# Patient Record
Sex: Male | Born: 1940 | Race: White | Hispanic: No | Marital: Married | Smoking: Former smoker
Health system: Southern US, Community
[De-identification: ages and names within clinical notes are randomized; demographics above are authoritative.]

## PROBLEM LIST (undated history)

## (undated) DIAGNOSIS — M1990 Unspecified osteoarthritis, unspecified site: Secondary | ICD-10-CM

## (undated) DIAGNOSIS — I2510 Atherosclerotic heart disease of native coronary artery without angina pectoris: Secondary | ICD-10-CM

## (undated) DIAGNOSIS — D126 Benign neoplasm of colon, unspecified: Secondary | ICD-10-CM

## (undated) DIAGNOSIS — Z8719 Personal history of other diseases of the digestive system: Secondary | ICD-10-CM

## (undated) DIAGNOSIS — N529 Male erectile dysfunction, unspecified: Secondary | ICD-10-CM

## (undated) DIAGNOSIS — Z87442 Personal history of urinary calculi: Secondary | ICD-10-CM

## (undated) DIAGNOSIS — E119 Type 2 diabetes mellitus without complications: Secondary | ICD-10-CM

## (undated) DIAGNOSIS — Z720 Tobacco use: Secondary | ICD-10-CM

## (undated) DIAGNOSIS — J302 Other seasonal allergic rhinitis: Secondary | ICD-10-CM

## (undated) DIAGNOSIS — N400 Enlarged prostate without lower urinary tract symptoms: Secondary | ICD-10-CM

## (undated) DIAGNOSIS — I10 Essential (primary) hypertension: Secondary | ICD-10-CM

## (undated) DIAGNOSIS — E785 Hyperlipidemia, unspecified: Secondary | ICD-10-CM

## (undated) DIAGNOSIS — K219 Gastro-esophageal reflux disease without esophagitis: Secondary | ICD-10-CM

## (undated) DIAGNOSIS — R011 Cardiac murmur, unspecified: Secondary | ICD-10-CM

## (undated) HISTORY — PX: NASAL SINUS SURGERY: SHX719

## (undated) HISTORY — DX: Benign neoplasm of colon, unspecified: D12.6

## (undated) HISTORY — PX: CATARACT EXTRACTION W/ INTRAOCULAR LENS  IMPLANT, BILATERAL: SHX1307

## (undated) HISTORY — PX: TONSILLECTOMY: SUR1361

## (undated) HISTORY — DX: Hyperlipidemia, unspecified: E78.5

## (undated) HISTORY — PX: APPENDECTOMY: SHX54

## (undated) HISTORY — DX: Tobacco use: Z72.0

## (undated) HISTORY — DX: Male erectile dysfunction, unspecified: N52.9

## (undated) HISTORY — DX: Benign prostatic hyperplasia without lower urinary tract symptoms: N40.0

## (undated) HISTORY — DX: Atherosclerotic heart disease of native coronary artery without angina pectoris: I25.10

## (undated) HISTORY — PX: NASAL SEPTUM SURGERY: SHX37

## (undated) HISTORY — PX: COLONOSCOPY W/ BIOPSIES AND POLYPECTOMY: SHX1376

## (undated) HISTORY — DX: Essential (primary) hypertension: I10

---

## 1999-07-17 ENCOUNTER — Ambulatory Visit (HOSPITAL_COMMUNITY): Admission: RE | Admit: 1999-07-17 | Discharge: 1999-07-17 | Payer: Self-pay | Admitting: *Deleted

## 2003-04-09 ENCOUNTER — Ambulatory Visit (HOSPITAL_COMMUNITY): Admission: RE | Admit: 2003-04-09 | Discharge: 2003-04-09 | Payer: Self-pay | Admitting: *Deleted

## 2003-05-06 ENCOUNTER — Ambulatory Visit (HOSPITAL_COMMUNITY): Admission: RE | Admit: 2003-05-06 | Discharge: 2003-05-06 | Payer: Self-pay | Admitting: *Deleted

## 2003-05-09 ENCOUNTER — Inpatient Hospital Stay (HOSPITAL_BASED_OUTPATIENT_CLINIC_OR_DEPARTMENT_OTHER): Admission: RE | Admit: 2003-05-09 | Discharge: 2003-05-09 | Payer: Self-pay | Admitting: *Deleted

## 2003-05-31 ENCOUNTER — Ambulatory Visit (HOSPITAL_COMMUNITY): Admission: RE | Admit: 2003-05-31 | Discharge: 2003-05-31 | Payer: Self-pay | Admitting: Internal Medicine

## 2003-10-28 ENCOUNTER — Emergency Department (HOSPITAL_COMMUNITY): Admission: EM | Admit: 2003-10-28 | Discharge: 2003-10-28 | Payer: Self-pay | Admitting: Emergency Medicine

## 2004-05-22 ENCOUNTER — Ambulatory Visit: Payer: Self-pay | Admitting: *Deleted

## 2004-06-01 ENCOUNTER — Ambulatory Visit: Payer: Self-pay | Admitting: *Deleted

## 2004-10-12 ENCOUNTER — Ambulatory Visit: Payer: Self-pay | Admitting: *Deleted

## 2005-05-30 ENCOUNTER — Emergency Department (HOSPITAL_COMMUNITY): Admission: EM | Admit: 2005-05-30 | Discharge: 2005-05-30 | Payer: Self-pay | Admitting: Emergency Medicine

## 2005-09-27 ENCOUNTER — Ambulatory Visit: Payer: Self-pay | Admitting: *Deleted

## 2005-10-15 ENCOUNTER — Ambulatory Visit: Payer: Self-pay | Admitting: Cardiology

## 2006-01-28 ENCOUNTER — Ambulatory Visit (HOSPITAL_COMMUNITY): Admission: RE | Admit: 2006-01-28 | Discharge: 2006-01-28 | Payer: Self-pay | Admitting: Family Medicine

## 2006-05-16 ENCOUNTER — Ambulatory Visit (HOSPITAL_COMMUNITY): Admission: RE | Admit: 2006-05-16 | Discharge: 2006-05-16 | Payer: Self-pay | Admitting: Urology

## 2007-02-23 HISTORY — PX: TRANSURETHRAL RESECTION OF PROSTATE: SHX73

## 2007-04-20 ENCOUNTER — Ambulatory Visit: Payer: Self-pay | Admitting: Internal Medicine

## 2007-07-04 ENCOUNTER — Ambulatory Visit (HOSPITAL_COMMUNITY): Admission: RE | Admit: 2007-07-04 | Discharge: 2007-07-04 | Payer: Self-pay | Admitting: Family Medicine

## 2007-09-07 ENCOUNTER — Emergency Department (HOSPITAL_COMMUNITY): Admission: EM | Admit: 2007-09-07 | Discharge: 2007-09-07 | Payer: Self-pay | Admitting: Emergency Medicine

## 2007-09-09 ENCOUNTER — Emergency Department (HOSPITAL_COMMUNITY): Admission: EM | Admit: 2007-09-09 | Discharge: 2007-09-09 | Payer: Self-pay | Admitting: Emergency Medicine

## 2007-09-09 ENCOUNTER — Emergency Department (HOSPITAL_COMMUNITY): Admission: EM | Admit: 2007-09-09 | Discharge: 2007-09-10 | Payer: Self-pay | Admitting: Emergency Medicine

## 2007-09-15 ENCOUNTER — Encounter (INDEPENDENT_AMBULATORY_CARE_PROVIDER_SITE_OTHER): Payer: Self-pay | Admitting: Urology

## 2007-09-15 ENCOUNTER — Inpatient Hospital Stay (HOSPITAL_COMMUNITY): Admission: RE | Admit: 2007-09-15 | Discharge: 2007-09-17 | Payer: Self-pay | Admitting: Urology

## 2007-09-28 ENCOUNTER — Inpatient Hospital Stay (HOSPITAL_COMMUNITY): Admission: EM | Admit: 2007-09-28 | Discharge: 2007-10-01 | Payer: Self-pay | Admitting: Emergency Medicine

## 2007-09-29 ENCOUNTER — Encounter (INDEPENDENT_AMBULATORY_CARE_PROVIDER_SITE_OTHER): Payer: Self-pay | Admitting: Urology

## 2008-03-12 ENCOUNTER — Encounter: Payer: Self-pay | Admitting: Cardiology

## 2008-03-12 LAB — CONVERTED CEMR LAB
Cholesterol: 156 mg/dL
HDL: 46 mg/dL
LDL (calc): 91 mg/dL

## 2008-04-30 ENCOUNTER — Ambulatory Visit: Payer: Self-pay | Admitting: Cardiology

## 2008-06-03 ENCOUNTER — Encounter (INDEPENDENT_AMBULATORY_CARE_PROVIDER_SITE_OTHER): Payer: Self-pay | Admitting: *Deleted

## 2008-07-02 ENCOUNTER — Encounter: Payer: Self-pay | Admitting: Internal Medicine

## 2008-07-08 ENCOUNTER — Ambulatory Visit (HOSPITAL_COMMUNITY): Admission: RE | Admit: 2008-07-08 | Discharge: 2008-07-08 | Payer: Self-pay | Admitting: Internal Medicine

## 2008-07-08 ENCOUNTER — Encounter: Payer: Self-pay | Admitting: Internal Medicine

## 2008-07-08 ENCOUNTER — Ambulatory Visit: Payer: Self-pay | Admitting: Internal Medicine

## 2008-07-10 ENCOUNTER — Encounter: Payer: Self-pay | Admitting: Internal Medicine

## 2008-07-10 ENCOUNTER — Telehealth (INDEPENDENT_AMBULATORY_CARE_PROVIDER_SITE_OTHER): Payer: Self-pay

## 2009-04-23 ENCOUNTER — Encounter (INDEPENDENT_AMBULATORY_CARE_PROVIDER_SITE_OTHER): Payer: Self-pay | Admitting: *Deleted

## 2009-04-23 LAB — CONVERTED CEMR LAB
Albumin: 4.6 g/dL
BUN: 20 mg/dL
CO2: 25 meq/L
Calcium: 9.4 mg/dL
Chloride: 99 meq/L
Glucose, Bld: 122 mg/dL
HCT: 45.2 %
Hgb A1c MFr Bld: 9.4 %
Potassium: 4.5 meq/L

## 2009-06-19 ENCOUNTER — Ambulatory Visit (HOSPITAL_COMMUNITY): Admission: RE | Admit: 2009-06-19 | Discharge: 2009-06-19 | Payer: Self-pay | Admitting: Family Medicine

## 2009-07-10 ENCOUNTER — Encounter (INDEPENDENT_AMBULATORY_CARE_PROVIDER_SITE_OTHER): Payer: Self-pay | Admitting: *Deleted

## 2009-07-11 ENCOUNTER — Ambulatory Visit: Payer: Self-pay | Admitting: Cardiology

## 2009-07-11 DIAGNOSIS — R945 Abnormal results of liver function studies: Secondary | ICD-10-CM

## 2009-07-11 DIAGNOSIS — R7989 Other specified abnormal findings of blood chemistry: Secondary | ICD-10-CM | POA: Insufficient documentation

## 2009-07-11 DIAGNOSIS — E785 Hyperlipidemia, unspecified: Secondary | ICD-10-CM

## 2009-07-11 DIAGNOSIS — N529 Male erectile dysfunction, unspecified: Secondary | ICD-10-CM | POA: Insufficient documentation

## 2009-07-11 DIAGNOSIS — E782 Mixed hyperlipidemia: Secondary | ICD-10-CM | POA: Insufficient documentation

## 2009-07-11 DIAGNOSIS — Z87448 Personal history of other diseases of urinary system: Secondary | ICD-10-CM | POA: Insufficient documentation

## 2010-03-14 ENCOUNTER — Encounter: Payer: Self-pay | Admitting: *Deleted

## 2010-03-24 NOTE — Miscellaneous (Signed)
Summary: LABS CMP,LIVER,04/23/2009  Clinical Lists Changes  Observations: Added new observation of CALCIUM: 9.4 mg/dL (95/28/4132 4:40) Added new observation of ALBUMIN: 4.6 g/dL (12/19/2534 6:44) Added new observation of PROTEIN, TOT: 7.7 g/dL (03/47/4259 5:63) Added new observation of SGPT (ALT): 62 units/L (04/23/2009 8:42) Added new observation of SGOT (AST): 42 units/L (04/23/2009 8:42) Added new observation of ALK PHOS: 61 units/L (04/23/2009 8:42) Added new observation of BILI DIRECT: 0.1 mg/dL (87/56/4332 9:51) Added new observation of CREATININE: 0.74 mg/dL (88/41/6606 3:01) Added new observation of BUN: 20 mg/dL (60/11/9321 5:57) Added new observation of BG RANDOM: 122 mg/dL (32/20/2542 7:06) Added new observation of CO2 PLSM/SER: 25 meq/L (04/23/2009 8:42) Added new observation of CL SERUM: 99 meq/L (04/23/2009 8:42) Added new observation of K SERUM: 4.5 meq/L (04/23/2009 8:42) Added new observation of NA: 137 meq/L (04/23/2009 8:42) Added new observation of PLATELETK/UL: 287 K/uL (04/23/2009 8:42) Added new observation of MCV: 95.8 fL (04/23/2009 8:42) Added new observation of HCT: 45.2 % (04/23/2009 8:42) Added new observation of HGB: 15.2 g/dL (23/76/2831 5:17) Added new observation of WBC COUNT: 8.0 10*3/microliter (04/23/2009 8:42) Added new observation of HGBA1C: 9.4 % (04/23/2009 8:42)

## 2010-03-24 NOTE — Assessment & Plan Note (Signed)
Summary: 1 yr f/u per reminder list/tg  Medications Added LISINOPRIL 10 MG TABS (LISINOPRIL) Take one tablet by mouth daily GLUCOPHAGE 500 MG TABS (METFORMIN HCL) TAKE 2 TABS two times a day ASPIR-LOW 81 MG TBEC (ASPIRIN) TAKE 1 TAB DAILY VYTORIN 10-20 MG TABS (EZETIMIBE-SIMVASTATIN) TAKE 1 TAB DAILY LANTUS 100 UNIT/ML SOLN (INSULIN GLARGINE) 14 UNITS DAILY      Allergies Added: NKDA  Primary Provider:  Dr. Gareth Morgan   History of Present Illness: Marcus Norton returns to the office as scheduled for continued assessment and treatment of cardiovascular risk factors with minimal coronary atherosclerosis at catheterization 5 years ago.  Since his last visit, he has done superbly.  He has dropped approximately 40 pounds over the last few years by decreasing caloric intake and increasing exercise.  He works out at [employer] virtually every day.  He has started insulin with better control of diabetes.  Fasting blood glucose levels run from 115 to 150 typically.  He denies cardiopulmonary symptoms except for occasional mild palpitations.    Current Medications (verified): 1)  Lisinopril 10 Mg Tabs (Lisinopril) .... Take One Tablet By Mouth Daily 2)  Glucophage 500 Mg Tabs (Metformin Hcl) .... Take 2 Tabs Two Times A Day 3)  Aspir-Low 81 Mg Tbec (Aspirin) .... Take 1 Tab Daily 4)  Vytorin 10-20 Mg Tabs (Ezetimibe-Simvastatin) .... Take 1 Tab Daily 5)  Lantus 100 Unit/ml Soln (Insulin Glargine) .[name].. 14 Units Daily  Allergies (verified): No Known Drug Allergies  Past History:  PMH, FH, and Social History reviewed and updated.  Review of Systems       See history of present illness.  Vital Signs:  Patient profile:   70 year old male Height:      67.5 inches Weight:      174 pounds BMI:     26.95 O2 Sat:      96 % on Room air Pulse rate:   88 / minute BP sitting:   151 / 73  (left arm)  Vitals Entered By: Marcus Saa, CNA (Jul 11, 2009 1:29 PM)  Nutrition Counseling:  Patient's BMI is greater than 25 and therefore counseled on weight management options.  O2 Flow:  Room air  Physical Exam  General:    Pleasant gentleman with proportionate weight and height, who is in no acute distress. Weight is 174, 8 pounds less than last year NECK:  No jugular venous distention; no carotid bruits. LUNGS:  Clear. CARDIAC:  Normal first and second heart sounds; normal PMI. ABDOMEN:  Soft and nontender; normal bowel sounds; no bruits; aortic pulsation barely palpable-does not seem to be widened; liver edge approximately 1 cm below the right costal margin, but not easily palpated. EXTREMITIES:  No edema. 1+ dorsalis pedis on the left; other pulses are normal Skin-Actinic damage to posterior hands, neck and face.    Impression & Recommendations:  Problem # 1:  HYPERTENSION (ICD-401.1) On repeat measurement, blood pressure was 135/60.  Blood pressure control is acceptable in this gentleman with diabetes.  He may require another agent and his blood pressure should increase above its current level.  Problem # 2:  HYPERLIPIDEMIA (ICD-272.4) Lipid profile was excellent one year ago with total cholesterol 137, triglycerides of 84, HDL 43 and LDL 77.  I will leave it to your discretion as to when you would like to repeat this study.  He is on a fairly low dose of simvastatin as a component of his Vytorin.  There would be a  considerable cost savings to treatment with simvastatin either 40 or 80 mg q.d.  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  Lantus insulin was subsequently added to his treatment regime  .A repeat value at his next office visit will hopefully be improved.  Patient had minimal luminal irregularities at catheterization.  I doubt that he will develop significant obstructive disease in the foreseeable future.  Problem # 4:  LIVER FUNCTION TESTS, ABNORMAL, HX OF (ICD-V12.2) Patient had minimal elevation  of SGOT and SGPT 2 months ago.  This could be related to treatment with a statin, in which case it would be benign, steatohepatitis in the setting of diabetes or to a different primary liver disease.  I recommend repeating a hepatic profile at the patient's next office visit with you.  If abnormal, an imaging study should be performed and statin held.  If transaminases return to normal, the statin is the culprit and can be resumed, since a minor elevation in LFTs is acceptable.  If not, further investigation will be warranted.  Patient Instructions: 1)  Your physician recommends that you schedule a follow-up appointment in: 1 year

## 2010-07-07 NOTE — Letter (Signed)
April 30, 2008    Marcus Norton. Sudie Bailey, MD  [address]  [place], [place] [address]   RE:  AZIEL, MORGAN  MRN:  [id number]  /  DOB:  23-May-1940   Dear Brett Canales:   Mr. Fralix returns to the office after a 1-year absence.  He was  previously seen by Dr. [name] and more recently by Dr. Tenny Craw, but now  joins my practice.  As you know, he had minimal coronary artery disease  when he last underwent cardiac catheterization in 2005; left ventricular  systolic function was normal.  He does have substantial risk factors,  notably diabetes, and hyperlipidemia.  He has not had hypertension, but  has been treated with an ACE inhibitor, apparently for renal protection.  He has had BPH and underwent a TURP last year.  After some initial  difficulties, he eventually obtained a good result.   CURRENT MEDICATIONS:  1. Metformin 2000 mg b.i.d.  2. Glipizide 5 mg daily.  3. Enalapril 5 mg daily.  4. Toprol 12.5 mg daily.  5. Aspirin 81 mg daily.  6. Vytorin 5/10 mg every other day.  7. He keeps nitroglycerin, but has never used it.   PHYSICAL EXAMINATION:  GENERAL:  Pleasant proportionate gentleman in no  acute distress.  VITAL SIGNS:  The weight is 182, 6 pounds less than last year.  Blood  pressure 125/70, heart rate is 65 and regular, respirations 12 and  unlabored.  NECK:  No jugular venous distention; no carotid bruits.  LUNGS:  Clear.  CARDIAC:  Normal first and second heart sounds; normal PMI.  ABDOMEN:  Soft and nontender; normal bowel sounds; no bruits; aortic  pulsation not palpable.  EXTREMITIES:  No edema.   Most recent lipid profile obtained in January.  Results were good with  total cholesterol of 156, triglycerides of 96, HDL of 46, and LDL of 91.  Chemistry profile was normal.  LFTs were slightly abnormal with  minimally elevated SGOT and SGPT.   IMPRESSION:  Mr. Derusha is doing well overall.  Diabetic control is  good, although I do not have a recent hemoglobin A1c  level.  Control of  hyperlipidemia is good as well, if not, somewhat suboptimal.  He has  tried to take higher doses of statins, but cannot tolerate them.  Under  those circumstances, his current level of treatment is acceptable.  He  typically maintains and supplied nitroglycerin, although he has never  used them.  A prescription will be written.  He uses Viagra on occasion.  He was cautioned not to take nitroglycerin within 48 hours of a dose of  Viagra.  His minimal dose of Toprol is not providing any apparent  benefit and will be discontinued.  Enalapril is typically a b.i.d. drug.  We will switch to lisinopril 10 mg daily.  I will see this nice  gentleman again in 1 year.    Sincerely,      Gerrit Friends. Dietrich Pates, MD, [facility]  Electronically Signed    RMR/MedQ  DD: 04/30/2008  DT: 05/01/2008  Job #: [id number]

## 2010-07-07 NOTE — Discharge Summary (Signed)
[name], Marcus Norton              ACCOUNT NO.:  [id number]   MEDICAL RECORD NO.:  [id number]          PATIENT TYPE:  INP   LOCATION:  1404                         FACILITY:  [facility]   PHYSICIAN:  Maretta Bees. Vonita Moss, M.D.DATE OF BIRTH:  16-Feb-1941   DATE OF ADMISSION:  09/15/2007  DATE OF DISCHARGE:  09/17/2007                               DISCHARGE SUMMARY   ADMISSION DIAGNOSES:  1. Bladder Neck Contracture  2. Prostatitis.  3. History of urinary tract infection  4. Diabetes Mellitus  5. Hypercholesterolemia.   DISCHARGE DIAGNOSES:  Same   HOSPITAL COURSE:  After admission the patient underwent a transurethral  resection of the prostate.  Fortunately his pathology was benign.  His  hematuia cleared nicely postoperatively.  Foley was removed on the day  of discharge he is voided satisfactorily.   DISCHARGE MEDICATIONS:  1. Avodart  2. Flomax.  3. Enalapril.  4. Vytorin.  5. Metformin  6. Januvia  7. Metoprolol   FOLLOWUP:  2 weeks   CONDITION ON DISCHARGE:  Improved condition. Limited activity for 4  weeks.      Maretta Bees. Vonita Moss, M.D.  Electronically Signed     LJP/MEDQ  D:  09/28/2007  T:  09/28/2007  Job:  [id number]

## 2010-07-07 NOTE — Assessment & Plan Note (Signed)
[facility]                       [facility] OFFICE NOTE   NAME:Norton, Marcus Norton                     MRN:          [id number]  DATE:04/20/2007                            DOB:          04-27-1940    IDENTIFICATION:  Marcus Norton is a 70 year old gentleman who was  previously followed by Marcus Norton.  He was last seen in cardiology  clinic back in August of 2007.   The patient has a history of nonobstructive CAD by cath approximately in  2005, luminal irregularities of the LAD, ramus, circumflex and RCA LVEF  of 60%-65%.  The patient also has a history of diabetes, dyslipidemia  and hypertension.  He is followed by Marcus Norton.   In the interval he has done pretty well.  He has lost some weight.  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  He denies chest pain.  No significant shortness of  breath.  His current medicines include Toprol XL 12.5 mg daily, aspirin  81 mg daily, vitamin, Vytorin 10/10 daily, metformin 1 gram a.m. and 500  mg p.m., Januvia, enalapril 5 b.i.d. and Flomax b.i.d.   PHYSICAL EXAMINATION:  On exam the patient is in no distress.  Blood  pressure is 148/70.  Pulse is 66.  Weight is 188.  NECK:  JVP is normal.  No bruits.  LUNGS:  Clear.  CARDIAC:  Regular rate and rhythm.  S1, S2, no S3.  ABDOMEN:  Benign.  EXTREMITIES:  No edema.   IMPRESSION:  1. 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      Clinically is asymptomatic.  Would continue to follow.  2. Hypertension.  Blood pressure is a little bit high today.  I would      follow.  I will not switch his regimen for now.  3. Dyslipidemia.  Recent lipid values from October show a LDL of 72,      HDL 45.  Would continue.  4. Diabetes.  Again, follow up by Dr. Metta Clines.  He has lost some      weight.  Hemoglobin A1c done in February was 8.6.  This will need      tighter control.   Tentatively follow  up in 1 year, sooner if problems develop.     Pricilla Riffle, MD, [facility]  Electronically Signed    PVR/MedQ  DD: 04/20/2007  DT: 04/21/2007  Job #: [id number]   cc:   Mila Homer. Sudie Bailey, M.D.

## 2010-07-07 NOTE — Op Note (Signed)
[name], Marcus Norton              ACCOUNT NO.:  [id number]   MEDICAL RECORD NO.:  [id number]          PATIENT TYPE:  INP   LOCATION:  1407                         FACILITY:  [facility]   PHYSICIAN:  Maretta Bees. Vonita Moss, M.D.DATE OF BIRTH:  25-Aug-1940   DATE OF PROCEDURE:  09/29/2007  DATE OF DISCHARGE:                               OPERATIVE REPORT   PREOPERATIVE DIAGNOSES:  Clot retention in B and C.   POSTOPERATIVE DIAGNOSES:  Clot retention in B and C.   PROCEDURE:  Cystoscopy, evacuation of clots and transurethral resection  of prostate.   SURGEON:  Dr. Larey Dresser.   ANESTHESIA:  General.   INDICATIONS:  This gentleman has had significant problems with bladder  outlet obstruction and retention and bleeding.  He had a TUR of the  prostate 2 weeks ago and was readmitted the hospital yesterday after  doing very well with a good stream and clear urine.  However, he started  bleeding and developed clot retention, and he has had inadequate relief  of his symptoms with catheterization and failed a voiding trial.  He is  brought to the OR today for cysto, evacuation of clots and possible  further prostate resection.   DESCRIPTION OF PROCEDURE:  The patient was brought to the operating room  and placed in the lithotomy position.  The external genitalia were  prepped and draped in the usual fashion.  He was cystoscoped and I  evacuated out a couple of old clots that I think he never would have  passed on his own.  There was very minimal bleeding at this time.  The  prostate fossa was well resected, but there was a cuff of tissue at the  apex that I had left at the previous procedure that had some bleeding  from the edges, and I believe might restrict his ability to pass any  clots in the future.  Therefore, I inserted the resectoscope sheath and  resected part of the cuff/collar of apical tissue, taking great care to  avoid the external sphincter.  I thoroughly fulgurated the  resected  areas and with having had essentially no blood loss at this time of  consequence and the ureteral orifices were intact, I then withdrew the  scope and saw the external sphincter to be intact.  I then inserted a 24-  [place] clear plastic three-way catheter with clear irrigation.  He was  connected to CBI and the catheter was placed on traction.  He was taken  to the recovery room in good condition having tolerated the procedure  well.      Maretta Bees. Vonita Moss, M.D.  Electronically Signed     LJP/MEDQ  D:  09/29/2007  T:  09/30/2007  Job:  [id number]

## 2010-07-07 NOTE — Op Note (Signed)
[name], Marcus Norton              ACCOUNT NO.:  [id number]   MEDICAL RECORD NO.:  [id number]          PATIENT TYPE:  INP   LOCATION:  0003                         FACILITY:  [facility]   PHYSICIAN:  Maretta Bees. Vonita Moss, M.D.DATE OF BIRTH:  March 02, 1940   DATE OF PROCEDURE:  09/15/2007  DATE OF DISCHARGE:                               OPERATIVE REPORT   PREOPERATIVE DIAGNOSES:  Bladder neck contracture and urinary retention.   POSTOPERATIVE DIAGNOSES:  Bladder neck contracture and urinary  retention.   PROCEDURE:  Transurethral resection of the prostate.   SURGEON:  Maretta Bees. Vonita Moss, M.D.   ANESTHESIA:  General.   INDICATIONS:  This gentleman has had a long history of voiding symptoms  and he has been on Avodart and Flomax.  Despite that, he has gone into  urinary retention and failed voiding trials.  He is admitted now for TUR  of the prostate.   DESCRIPTION OF PROCEDURE:  The patient was brought to the operating room  and placed in lithotomy position.  His Foley catheter was removed and he  was prepped and draped in the usual fashion.  He was sounded from 8 to  80 [place].  A 28 French resectoscope sheath was inserted.  He had  significant bilobar hypertrophy of the prostate and a trabeculated  bladder, but no intravesical lesions.  There was no significant median  lobe.   I resected the right lobe first and it was not as large as the left lobe  and, once I got that resected down to the capsule, I turned my attention  to the left lobe of the prostate.  He had a very vascular prostate.  There was a fair amount of anterior tissue.  After resecting a little  over an hour, it was felt that he had an adequate prostatic fossa.  I  could see capsule on both sides.  There was perhaps some residual  anterior tissue, but I do not think it will be a problem.  Prostate  chips were removed from the bladder.  The ureteral orifices appeared  intact.  There was good hemostasis.  I coagulated some  bleeders and the  external sphincter was seen to be intact as I removed the scope.  A  three-way Foley catheter was inserted and irrigation cleared up nicely  and catheter was placed on traction with 45 mL in the balloon.   ESTIMATED BLOOD LOSS:  Approximately 245 mL.      Maretta Bees. Vonita Moss, M.D.  Electronically Signed     LJP/MEDQ  D:  09/15/2007  T:  09/15/2007  Job:  [id number]

## 2010-07-07 NOTE — Op Note (Signed)
NAMECAYCE, Marcus Norton              ACCOUNT NO.:  [id number]   MEDICAL RECORD NO.:  [id number]          PATIENT TYPE:  AMB   LOCATION:  DAY                           FACILITY:  [facility]   PHYSICIAN:  R. Roetta Sessions, M.D. DATE OF BIRTH:  11-08-1940   DATE OF PROCEDURE:  07/08/2008  DATE OF DISCHARGE:                               OPERATIVE REPORT   Colonoscopy with polyp ablation and snare polypectomy.   INDICATIONS FOR PROCEDURE:  A 68-year gentleman with history of colonic  adenomas removed back in 2000.  Last colonoscopy in 2005.  He had  hyperplastic polyps removed at that time.  He has no lower GI tract  symptoms.  Colonoscopy is now being done as surveillance maneuver.  Risks, benefits, alternatives and limitations, the patient had been  discussed.  Please see documentation in the medical record.   PROCEDURE NOTE:  Oxygen saturation, blood pressure, pulse and  respirations monitored the entire procedure.   CONSCIOUS SEDATION:  Versed 4 mg IV, Demerol 75 mg IV in divided doses.   INSTRUMENT:  Pentax video chip system.   FINDINGS:  Digital rectal exam revealed no abnormalities.   ENDOSCOPIC FINDINGS:  Prep was adequate.   COLON:  Colonic mucosa was surveyed from the rectosigmoid junction  through the left, transverse and right colon to the appendiceal orifice,  ileocecal valve and cecum.  These structures were well seen and  photographed for the record.  From this level, the scope was slowly and  cautiously withdrawn.  All previously mentioned mucosal surfaces were  again seen.  The patient was noted have shallow amount of scattered left-  sided diverticula.  There were multiple pale, 1-3 mm hyperplastic  appearing polyps about the hepatic flexure which were ablated with the  snare tip.  At the hepatic flexure, there was a 1.5 cm x 6 mm sessile,  flat, oval-shaped polyp with central area of depression which was  examined and looked at meticulously.  There was sort of a  herringbone  pattern on each end and clear demarcation between, otherwise normal  surrounding mucosa in this lesion which I felt was a sessile polyp.  It  had features of both hyperplastic and adenomatous tissue endoscopically.  I did remove this lesion in piecemeal fashion with 3 passes of the  snare.  This was done nicely without apparent complication.  I  did not  feel that saline assistance was needed as it gathered up nicely and was  removed with 3 passes.  The remainder of the colonic mucosa appeared  normal.  The scope was pulled down to the rectum.  The rectal mucosa  including retroflex view of the anal verge demonstrated multiple small  hyperplastic-appearing polyps in the proximal rectum which were ablated  with the tip of the hot snare cautery unit.  The patient also had anal  papilla.  He tolerated the procedure very well, was reactive in  endoscopy.  Cecal withdrawal time 24 minutes.   IMPRESSION:  1. Anal papilla.  2. Diminutive proximal rectal polyps ablated as described above.  3. Otherwise unremarkable rectum.  4. Shallow small  mouth left-sided diverticula.  5. Polyp at hepatic flexure, status post piecemeal polypectomy.  6. Diminutive polyps near the hepatic flexure ablated with the tip      snare cautery unit.  7. Remainder colonic mucosa appeared normal.   RECOMMENDATIONS:  1. Diverticulosis polyp literature provided [name].  2. No aspirin or arthritis medications for 5 days.  3. Follow-up on path.  4. Further recommendations to follow.      Jonathon Bellows, M.D.  Electronically Signed     RMR/MEDQ  D:  07/08/2008  T:  07/08/2008  Job:  [id number]

## 2010-07-07 NOTE — H&P (Signed)
NAMESHANAN, MCMILLER              ACCOUNT NO.:  [id number]   MEDICAL RECORD NO.:  [id number]          PATIENT TYPE:  INP   LOCATION:  1407                         [facility]AC[facility]   PHYSICIAN:  Maretta Bees. Vonita Moss, M.D.DATE OF BIRTH:  1940-12-25   DATE OF ADMISSION:  09/28/2007  DATE OF DISCHARGE:                              HISTORY & PHYSICAL   HISTORY:  This gentleman has had a long history of bladder outlet  obstructive symptoms, prostatitis.  He recently had problems with  retention and underwent TUR of the prostate on September 15, 2007.  He went  home on Flomax and Septra, and he was voiding well earlier in the week  with clear urine and excellent flow.  He had the onset last night of  painful bladder with bleeding and clots.  He came to the emergency room,  where a Foley catheter was inserted and apparently he only had 150 mL  but he did have grossly bloody clots.  CT scan showed a bladder with  clots but no other specific lesions.  No evidence of recurrent stone  disease.  He was admitted for continued observation and therapy.  Unfortunately, he had severe bladder spasms and does not tolerate the  catheter very well.   PAST MEDICAL HISTORY/REVIEW OF SYSTEMS/MEDICATIONS/SOCIAL HISTORY:  Unchanged from recent H&P.   PHYSICAL EXAMINATION:  Also unchanged with the exception of being in  acute distress at this time.   IMPRESSION:  1. Prostatic hemorrhage.  2. Bladder neck contracture.  3. Bladder spasms.  4. Diabetes.  5. Hypertension.  6. Hypercholesterolemia.   PLAN:  Foley catheter drainage, pain meds, B&O suppository and voiding  trial pending his clinical course.      Maretta Bees. Vonita Moss, M.D.  Electronically Signed     LJP/MEDQ  D:  09/28/2007  T:  09/28/2007  Job:  [id number]

## 2010-07-07 NOTE — H&P (Signed)
NAMESULLY, DYMENT              ACCOUNT NO.:  [id number]   MEDICAL RECORD NO.:  [id number]          PATIENT TYPE:  INP   LOCATION:  0003                         FACILITY:  [facility]   PHYSICIAN:  Maretta Bees. 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   PAST HISTORY:  1. Urinary tract calculi, treated surgically in the past.  2. He also has hypertension and diabetes.  3. He has had remote history of tonsillectomy and appendectomy.   ALLERGIES:  DENIED.   CURRENT MEDICATIONS:  1. Avodart 0.5 mg daily  2. Five milligrams of enalapril daily.  3. Vytorin 10/20 one-half tablet every other day.  4. Metformin 5 mg two b.i.d.  5. Januvia 100 mg q.a.m.  6. Metoprolol 12.5 mg b.i.d.  7. Has been on 0.4 mg of Flomax daily.  8. Also uses Hydrocodone p.r.n. pain.  9. Has been on 81 mg aspirin.   Tobacco:  None.   Alcohol:  None.   REVIEW OF SYSTEMS:  He had some right sided pain last night that was  somewhat reminiscent of the stone but he has had no fever, chest pain,  cough, shortness of breath, no nausea, diarrhea or vomiting.  No  peripheral edema.   EXAMINATION:  His blood pressure was 130/62, pulse 65, temperature 97.1.  He is alert and oriented.  SKIN:  Warm and dry.  No acute distress.  NECK:  Supple.  CHEST:  Clear.  HEART:  Sounds regular.  ABDOMEN:  Soft, nontender.  EXTERNAL GENITALIA:  Unremarkable except for Foley catheter in place.  Prostate feels enlarged and smooth.  No peripheral edema.   IMPRESSION:  1. BNC.  2.  Urinary retention.  3. History of stones.  4. Diabetes.  5. Hypertension.  6. Hypercholesterolemia.   PLAN:  TUR of the prostate.      Maretta Bees. Vonita Moss, M.D.  Electronically Signed     LJP/MEDQ  D:  09/15/2007  T:  09/15/2007  Job:  [id number]

## 2010-07-08 DIAGNOSIS — I10 Essential (primary) hypertension: Secondary | ICD-10-CM

## 2010-07-08 DIAGNOSIS — Z720 Tobacco use: Secondary | ICD-10-CM

## 2010-07-08 DIAGNOSIS — I2510 Atherosclerotic heart disease of native coronary artery without angina pectoris: Secondary | ICD-10-CM

## 2010-07-08 DIAGNOSIS — E119 Type 2 diabetes mellitus without complications: Secondary | ICD-10-CM

## 2010-07-08 DIAGNOSIS — Z87891 Personal history of nicotine dependence: Secondary | ICD-10-CM | POA: Insufficient documentation

## 2010-07-09 ENCOUNTER — Encounter: Payer: Self-pay | Admitting: Cardiology

## 2010-07-09 ENCOUNTER — Ambulatory Visit (INDEPENDENT_AMBULATORY_CARE_PROVIDER_SITE_OTHER): Payer: Medicare Other | Admitting: Cardiology

## 2010-07-09 DIAGNOSIS — E785 Hyperlipidemia, unspecified: Secondary | ICD-10-CM

## 2010-07-09 DIAGNOSIS — I10 Essential (primary) hypertension: Secondary | ICD-10-CM

## 2010-07-09 DIAGNOSIS — I2510 Atherosclerotic heart disease of native coronary artery without angina pectoris: Secondary | ICD-10-CM

## 2010-07-09 NOTE — Patient Instructions (Addendum)
Your physician recommends that you schedule a follow-up appointment in: 1 YEAR Your physician has recommended you make the following change in your medication:increase lisinopril to 20mg  daily

## 2010-07-10 MED ORDER — LISINOPRIL 20 MG PO TABS: 20.0000 mg | ORAL_TABLET | Freq: Every day | ORAL | Status: DC

## 2010-07-10 NOTE — Progress Notes (Signed)
HPI : [name] returns to the office for continued assessment and treatment of modest coronary artery disease and control of cardiovascular risk factors.  Since his last visit, he has done generally well.  He denies dyspnea, orthopnea, PND or chest discomfort.  He remains active without significant new medical issues.  Current Outpatient Prescriptions  Medication Sig Dispense Refill  . doxycycline (VIBRAMYCIN) 100 MG capsule Take 100 mg by mouth 2 (two) times daily.        [name] ezetimibe-simvastatin (VYTORIN) 10-20 MG per tablet Take 1 tablet by mouth at bedtime. Take 1/2 tab daily       . insulin glargine (LANTUS) 100 UNIT/ML injection Inject 24 Units into the skin at bedtime.        [name] lisinopril (PRINIVIL,ZESTRIL) 10 MG tablet Take 10 mg by mouth daily.        . metFORMIN (GLUCOPHAGE) 500 MG tablet Take 500 mg by mouth 2 (two) times daily with a meal. 2 tabs bid        . nitroGLYCERIN (NITROSTAT) 0.4 MG SL tablet Place 0.4 mg under the tongue every 5 (five) minutes as needed.         No Known Allergies    Past medical history, social history, and family history reviewed and updated.  ROS: See HPI.  PHYSICAL EXAM: BP 143/64  Pulse 80  Ht 5\' 8"  (1.727 m)  Wt 183 lb (83.008 kg)  BMI 27.82 kg/m2  SpO2 97%  General-Well developed; no acute distress Body habitus-proportionate weight and height Neck-No JVD; no carotid bruits Lungs-clear lung fields; resonant to percussion Cardiovascular-normal PMI; normal S1 and S2 Abdomen-normal bowel sounds; soft and non-tender without masses or organomegaly Musculoskeletal-No deformities, no cyanosis or clubbing Neurologic-Normal cranial nerves; symmetric strength and tone Skin-Warm, no significant lesions Extremities-distal pulses intact; no edema  Laboratory:  05/2010-normal metabolic profile except for glucose of 152, excellent lipid profile with total cholesterol of 101, triglycerides of 64, HDL of 38 and LDL of 50; hemoglobin A1c of 8.6,  decreased from  9.4 in 2011.  ASSESSMENT AND PLAN:

## 2010-07-10 NOTE — Cardiovascular Report (Signed)
[place]  Patient:    Marcus Norton, Marcus Norton                     MRN: [id number] Proc. Date: 07/17/99 Adm. Date:  [id number] Disc. Date: [id number] Attending:  Daisey Must CC:         Mila Homer. Sudie Bailey, M.D., [place], [place]             Jesse Sans. Wall, M.D. [facility]             Cardiac Catheterization Lab                        Cardiac Catheterization  PROCEDURE PERFORMED: 1. Left heart catheterization with coronary angiography and left    ventriculography. 2. Intravascular ultrasound of proximal right coronary artery.  INDICATIONS:  Marcus Norton is a 70 year old male with multiple cardiovascular risk factors including diabetes and hyperlipidemia.  He recently underwent an exercise treadmill test which was positive with electrocardiographic changes suggestive of ischemia.  PROCEDURAL NOTE:  We initially placed a 6-French sheath in the right femoral artery and performed diagnostic catheter with standard Judkins 6-French catheters.  Because of a stenosis in the ostium of the right coronary artery of questionable significance, we opted to proceed with intravascular ultrasound.  We therefore exchanged for a 7-French sheath.  We used a 7-French JR4 guiding catheter with sideholes and a BMW wire.  Heparin was administered to achieve an ACT of greater than 200 seconds.  Intravascular ultrasound was performed with an Atlantis catheter on automatic pullback.  We then went back and did manual pullback to confirm the severity of the stenosis at the ostium. The patient did have chest pain and ST segment elevation during intravascular ultrasound.  This resolved with removal of the ultrasound catheter and administration of intravenous nitroglycerin.  At the conclusion of the procedure, the guiding catheter was removed and sheath was removed, and a Perclose vascular closure device was placed in the right femoral artery with good hemostasis.  There were no  complications.  CATHETERIZATION RESULTS:  Hemodynamics:  Left ventricular pressure 152/22.  Aortic pressure 152/70. There is no aortic valve gradient.  Left ventriculogram:  Wall motion is normal.  Ejection fraction is estimated at 65%.  There is a trace mitral regurgitation.  Coronary arteriography (right dominant): 1. The left main has diffuse 20% stenosis. 2. The LAD has a proximal 25% stenosis.  It gives rise to a large first    diagonal branch. 3. The left circumflex has a distal 25% stenosis.  It gives rise to a normal    sized ramus intermedius which has a 25% stenosis proximally.  There is a    small OM-1, a normal sized OM-2 which has a 25% stenosis in the mid vessel.    The OM-3 is small. 4. The right coronary artery has, what appears to be, a 50% stenosis at its    ostium with a filling defect, most likely secondary to calcification.    There is a diffuse 30% stenosis in the proximal right coronary artery.    The right coronary artery gives rise to a large acute marginal branch,    supplying a portion of the inferior septum.  There is a small posterior    descending artery and a small posterolateral branch.  Intravascular ultrasound of the right coronary artery revealed mild diffuse concentric plaquing of the proximal right coronary artery.  In the ostium of the  right coronary artery is a focal area of calcification with a diameter stenosis which measured to be 30% diameter stenosis at the ostium.  The area of stenosis was approximately 50%.  IMPRESSIONS: 1. Normal left ventricular systolic function. 2. Nonobstructive coronary artery disease as described.  There is disease in    the proximal right coronary artery; however, intravascular ultrasound    confirmed the presence of a significant calcification but nonobstructive    stenosis.  PLAN:  Recommendations for aggressive medical therapy and risk factor reduction. DD:  07/17/99 TD:  07/19/99 Job:  [id number]

## 2010-07-10 NOTE — Assessment & Plan Note (Signed)
Patient remains asymptomatic with no clinical evidence for progression of disease.

## 2010-07-10 NOTE — Cardiovascular Report (Signed)
NAME:  BRADLEY, BOSTELMAN                        ACCOUNT NO.:  [id number]   MEDICAL RECORD NO.:  [id number]                   PATIENT TYPE:  OIB   LOCATION:  6501                                 FACILITY:  [facility]   PHYSICIAN:  Vida Roller, M.D.                DATE OF BIRTH:  [birth year]   DATE OF PROCEDURE:  05/09/2003  DATE OF DISCHARGE:  05/09/2003                              CARDIAC CATHETERIZATION   PRIMARY CARE PHYSICIAN:  Mila Homer. Sudie Bailey, M.D.   HISTORY OF PRESENT ILLNESS:  Mr. Jankowski is a 70 year old gentleman with no  known coronary artery disease who came in to see me with chest discomfort,  had an exercise perfusion study which showed ST segment depression in the  early stages of recovery with inferolateral ischemia on perfusion scan with  preserved left ventricular systolic function and we discussed various  therapies and he has chosen invasive assessment.   DESCRIPTION OF PROCEDURE:  After obtaining informed consent, the patient was  brought to the cardiac catheterization laboratory in a fasting state.  There, he was prepped and draped in the usual sterile manner and anesthesia  was obtained over the right wrist using 1% lidocaine without epinephrine.  Unfortunately, the right radial artery was not able to be cannulated  secondary to radial artery spasm and we prepped and draped his right groin  and accessed his right femoral artery via the modified Seldinger technique  utilized using a 5 French 10 cm sheath and left heart catheterization was  performed using a 5 French Judkins left #4, a 5 French Judkins right #4, and  a 5 French pigtail catheter.  The pigtail catheter was used for left  ventriculography which was imaged in the 30 degree RAO view.  At the  conclusion of the procedure, the catheters were removed, the patient was  moved back to the cardiology holding area where the right femoral artery  sheath was removed.  Hemostasis was obtained using direct manual  pressure at  the conclusion of the hold.  There was no evidence of ecchymosis or hematoma  formation and distal pulses were intact.  Total fluoroscopic time was 6.4  minutes.  Total iodonized contrast was 100 mL.   RESULTS:  Left heart pressure 126/65 with a mean arterial pressure 72.  Left ventricular  pressure 126/5 with an end diastolic pressure of 13.   CORONARY ANGIOGRAPHY:  The left main coronary artery is a moderate caliber  vessel which has some calcium in its most distal portion, the calcium  extends into the left circumflex coronary artery, as well.  It is widely  patent.   The left anterior descending  coronary artery is a normal size vessel which  has one large branching diagonal system and has luminal irregularities but  is, otherwise, free of disease.   There is a small caliber ramus intermedius artery which is also free of  disease.   The circumflex  coronary artery is a moderate caliber vessel which has one  large branching obtuse marginal and a small circumflex coronary artery in  the AV groove.  It only has luminal irregularities at its proximal portion  at the site of the calcium.   The right coronary artery is a moderate caliber dominant vessel which has  luminal irregularities.  There is a small posterior descending coronary  artery which bifurcates.  The remainder of the right coronary anatomy is  free of disease.   The left ventriculogram reveals normal left ventricular  systolic function  estimated at 60-65% with no wall motion abnormalities and no mitral  regurgitation.   Please note that when the patient had injections into his coronary artery  with dye in the left system, he had chest discomfort and ST segment  elevation without any evidence of coronary occlusion.  This resolved with a  single dose of sublingual nitroglycerin and cannula oxygen.   ASSESSMENT:  1. Nonobstructive coronary artery disease.  2. Normal left ventricular systolic function.   3. Coronary spasm and I suspect it is probably small vessel spasm secondary     to vaso-hyper reactivity and this we can treat aggressively with calcium     channel blockers.                                               Vida Roller, M.D.    JH/MEDQ  D:  05/09/2003  T:  05/10/2003  Job:  [id number]

## 2010-07-10 NOTE — Op Note (Signed)
NAME:  [name], Marcus Norton                        ACCOUNT NO.:  [id number]   MEDICAL RECORD NO.:  [id number]                   PATIENT TYPE:  AMB   LOCATION:  DAY                                  FACILITY:  [facility]   PHYSICIAN:  R. Roetta Sessions, M.D.              DATE OF BIRTH:  1940/09/14   DATE OF PROCEDURE:  05/31/2003  DATE OF DISCHARGE:                                 OPERATIVE REPORT   PROCEDURE:  Colonoscopy with biopsy.   INDICATION FOR PROCEDURE:  The patient is a 70 year old gentleman with a  history of colonic polyps removed in 2000.  He is here for surveillance.  He  has not had any GI symptoms.  He has done well.  He had an abnormality on  noninvasive cardiac study recently, which led to a cardiac catheterization  two weeks ago by Vida Roller, M.D.  He was said to have patent coronary  arteries.  Colonoscopy is now being done as a surveillance maneuver.  This  approach has been discussed with the patient at length.  The potential  risks, benefits, and alternatives have been reviewed, questions answered.  He is agreeable.  Please see my handwritten H&P.   PROCEDURE NOTE:  O2 saturation, blood pressure, pulse, and respirations were  monitored throughout the entirety of the procedure.   CONSCIOUS SEDATION:  Versed 4 mg IV, Demerol 75 mg IV in divided doses.   INSTRUMENT USED:  Olympus video chip colonoscope.   FINDINGS:  Digital rectal exam revealed no abnormalities.   Endoscopic findings:  The prep was good.   Rectum:  Examination of the rectal mucosa, including the retroflexed view of  the anal verge, revealed no abnormalities.   Colon:  The colonic mucosa was surveyed from the rectosigmoid junction  through the left, transverse, and right colon to the area of the appendiceal  orifice and ileocecal valve and cecum.  These structures were well-seen and  photographed for the record.  From this level the scope was slowly  withdrawn.  All previously-mentioned mucosal  surfaces were again seen.  The  patient was noted to have a 3 mm polyp in the mid-right colon which was cold  biopsied/removed, and a pale-appearing similar-sized 3 mm polyp at 35 cm,  which was cold biopsied/removed.  The remainder of the colonic mucosa  appeared normal.  The patient tolerated the procedure well, was reacted in  endoscopy.   IMPRESSION:  1. Normal rectum.  2. Diminutive polyps in the right colon and at 35 cm, cold biopsied/removed.  3. The remainder of colonic mucosa appeared normal.   RECOMMENDATIONS:  1. Follow up on pathology.  2. Further recommendations to follow.  Marcus Norton, M.D.    RMR/MEDQ  D:  05/31/2003  T:  05/31/2003  Job:  [id number]   cc:   Mila Homer. Sudie Bailey, M.D.  [address] [place], [place] [address]  Fax: [phone]

## 2010-07-10 NOTE — Assessment & Plan Note (Addendum)
Control of hyperlipidemia is excellent with current medications; however, patient's insurer is attempting to substitute a less expensive agent.  Pravastatin 80 mg q.d might prove adequate if any change is necessary.

## 2010-07-10 NOTE — Procedures (Signed)
NAME:  WYMAN, MESCHKE NO.:  [id number]   MEDICAL RECORD NO.:  [id number]                  PATIENT TYPE:   LOCATION:                                       FACILITY:  [facility]   PHYSICIAN:  Vida Roller, M.D.                DATE OF BIRTH:   DATE OF PROCEDURE:  DATE OF DISCHARGE:                                    STRESS TEST   INDICATION:  [name] is a 70 year old male, with history of  nonobstructive coronary artery disease and normal left ventricular function  by last cardiac catheterization 2001, type 2 diabetes mellitus,  dyslipidemia, who presents with occasional chest discomfort after prolonged  walking.   TEST:  Patient exercised a total of 6 minutes and 32 seconds on standard  Bruce protocol.  Test discontinued secondary to fatigue.   Heart rate rose from 66 baseline to 144 maximum (92% PMHR; 7.0 METS), blood  pressure 140/82 baseline to 202/98 maximum.   Patient denied any chest pain during exercise or during recovery phase.   Serial EKG tracings revealed borderline inferolateral ST depression during  exercise with prompt resolution to baseline in early recovery; J-point  elevation noted in V1 as well; however, at 5-minute recovery mark,  downsloping ST inversion noted in inferolateral leads II, III, aVF, V5, and  V6 persisting with resolution to baseline at 16-minute mark of recovery.   CONCLUSION:  Clinically negative; electrographically abnormal treadmill test  secondary to inferolateral ST changes during recovery.  Cardiolite images  pending.     ________________________________________  ___________________________________________  Rozell Searing, P.A. [facility]                       Vida Roller, M.D.   GS/MEDQ  D:  04/09/2003  T:  04/09/2003  Job:  [id number]   cc:   Mila Homer. Sudie Bailey, M.D.  [address] [place], [place] [address]  Fax: [phone]

## 2010-07-10 NOTE — Assessment & Plan Note (Signed)
Blood pressure control slightly suboptimal at this visit; we will increase lisinopril to 20 mg q.d.

## 2010-07-10 NOTE — Assessment & Plan Note (Signed)
[facility]                         [place] CARDIOLOGY OFFICE NOTE   NAME:Marcus Norton, Marcus Norton                     MRN:          [id number]  DATE:09/27/2005                            DOB:          [birth year]    PRIMARY:  Marcus Norton. Marcus Bailey, MD   Marcus Norton comes in today to follow up with me.  I follow his coronary  artery disease which is nonobstructive on a recent heart catheterization  done in 2005.  Diabetes, hyperlipidemia and hypertension.  We are primarily  working with secondary prophylaxis for Marcus Norton.  He Norton had an  occasional episode of palpitations but none in many months.   He is on the following medications:  1.  Aspirin 81 mg once a day.  2.  Foltx 1 mg once a day.  3.  Toprol-XL 12.5 mg once a day.  4.  Vytorin 10 and 10 once a day.  5.  Metformin 500 mg.  He takes two in the morning and one in the evening.  6.  Januvia at an unknown dose.  7.  Enalapril 5 mg twice a day.  8.  He takes nitroglycerin on an as-needed basis.   He Norton an intolerance to Zocor.   PHYSICAL EXAM:  Today, his blood pressure is 140/70, his pulse is 96.  CHEST:  Clear.  He Norton no jugular venous distension or carotid bruits.  CARDIOVASCULAR:  Exam is regular with no significant murmur.  LOWER EXTREMITIES:  Without edema.   Electrocardiogram shows sinus rhythm at a rate of 88.  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  No significant change from March 2006.   We talked a little bit about his secondary prophylaxis for coronary artery  disease.  His cholesterol recently evaluated looks absolutely marvelous.  His total was 111 with an LDL of 50 and HDL of 52 and triglycerides of 45.  LFTs are normal.  Basic metabolic panel is normal.  CBC is normal.  That is  very reassuring.  His blood  pressure is not entirely well-controlled.  I  think it would be reasonable to probably increase his Toprol up to a more  therapeutic dose at 25 mg once a day and see how his blood pressure does and  his heart rate.  It would probably help a little bit with the palpitations  he occasionally Norton.  His diabetes is under the care of Dr. Sudie Norton and he  is pretty comfortable with that.  Fasting blood glucose looks pretty good.  He does not smoke.  He is not a physically active guy and so I have  encouraged him to consider a little bit of an exercise program and hope to  get him in better control of his weight, as his weight Norton been sort of  reasonably stable in the 200 range.  We will see him back in routine  followup in 12  months.  We are going to have  him come back in just two weeks for a blood  pressure check as we have increased his metoprolol dose.                                   Marcus Norton. Marcus Clan, MD   JMH/MedQ  DD:  09/27/2005  DT:  09/27/2005  Job #:  [id number]   cc:   Marcus Norton. Marcus Bailey, MD

## 2010-09-29 ENCOUNTER — Other Ambulatory Visit (HOSPITAL_COMMUNITY): Payer: Self-pay | Admitting: Family Medicine

## 2010-09-29 ENCOUNTER — Ambulatory Visit (HOSPITAL_COMMUNITY)
Admission: RE | Admit: 2010-09-29 | Discharge: 2010-09-29 | Disposition: A | Discharge status: Home / Self Care | Payer: Medicare Other | Source: Ambulatory Visit | Attending: Family Medicine | Admitting: Family Medicine

## 2010-09-29 DIAGNOSIS — M25539 Pain in unspecified wrist: Secondary | ICD-10-CM | POA: Insufficient documentation

## 2010-09-29 DIAGNOSIS — R52 Pain, unspecified: Secondary | ICD-10-CM

## 2010-09-29 DIAGNOSIS — M79609 Pain in unspecified limb: Secondary | ICD-10-CM | POA: Insufficient documentation

## 2010-09-29 DIAGNOSIS — R937 Abnormal findings on diagnostic imaging of other parts of musculoskeletal system: Secondary | ICD-10-CM | POA: Insufficient documentation

## 2010-10-07 ENCOUNTER — Other Ambulatory Visit (HOSPITAL_COMMUNITY): Payer: Self-pay | Admitting: Family Medicine

## 2010-10-07 ENCOUNTER — Ambulatory Visit (HOSPITAL_COMMUNITY)
Admission: RE | Admit: 2010-10-07 | Discharge: 2010-10-07 | Disposition: A | Discharge status: Home / Self Care | Payer: Medicare Other | Source: Ambulatory Visit | Attending: Family Medicine | Admitting: Family Medicine

## 2010-10-07 DIAGNOSIS — R52 Pain, unspecified: Secondary | ICD-10-CM

## 2010-10-07 DIAGNOSIS — M25539 Pain in unspecified wrist: Secondary | ICD-10-CM | POA: Insufficient documentation

## 2010-11-20 LAB — BASIC METABOLIC PANEL
BUN: 10
BUN: 26 — ABNORMAL HIGH
BUN: 17
CO2: 27
Calcium: 8.3 — ABNORMAL LOW
Calcium: 9.9
Calcium: 8.1 — ABNORMAL LOW
Chloride: 99
Chloride: 104
Creatinine, Ser: 0.73
GFR calc Af Amer: >60
GFR calc Af Amer: >60
GFR calc Af Amer: >60
GFR calc non Af Amer: >60
GFR calc non Af Amer: >60
GFR calc non Af Amer: >60
Glucose, Bld: 205 — ABNORMAL HIGH
Glucose, Bld: 145 — ABNORMAL HIGH
Potassium: 4.1
Potassium: 4.7
Potassium: 4.8
Potassium: 4.2
Sodium: 132 — ABNORMAL LOW
Sodium: 136

## 2010-11-20 LAB — GLUCOSE, CAPILLARY
Glucose-Capillary: 133 — ABNORMAL HIGH
Glucose-Capillary: 164 — ABNORMAL HIGH
Glucose-Capillary: 194 — ABNORMAL HIGH
Glucose-Capillary: 205 — ABNORMAL HIGH
Glucose-Capillary: 216 — ABNORMAL HIGH
Glucose-Capillary: 113 — ABNORMAL HIGH
Glucose-Capillary: 129 — ABNORMAL HIGH
Glucose-Capillary: 143 — ABNORMAL HIGH
Glucose-Capillary: 144 — ABNORMAL HIGH
Glucose-Capillary: 146 — ABNORMAL HIGH
Glucose-Capillary: 163 — ABNORMAL HIGH
Glucose-Capillary: 182 — ABNORMAL HIGH

## 2010-11-20 LAB — URINALYSIS, ROUTINE W REFLEX MICROSCOPIC
Glucose, UA: 100 — AB
Ketones, ur: NEGATIVE
Nitrite: NEGATIVE
Specific Gravity, Urine: 1.015
Specific Gravity, Urine: 1.03
Urobilinogen, UA: 0.2
pH: 7
pH: 7

## 2010-11-20 LAB — URINE MICROSCOPIC-ADD ON

## 2010-11-20 LAB — CBC
HCT: 35.4 — ABNORMAL LOW
HCT: 24.5 — ABNORMAL LOW
HCT: 27.4 — ABNORMAL LOW
Hemoglobin: 12.1 — ABNORMAL LOW
Hemoglobin: 10.2 — ABNORMAL LOW
Hemoglobin: 11 — ABNORMAL LOW
Hemoglobin: 9.3 — ABNORMAL LOW
MCHC: 34.2
MCHC: 34
MCHC: 34.3
MCV: 98.9
MCV: 99.5
Platelets: 252
Platelets: 366
Platelets: 439 — ABNORMAL HIGH
RBC: 3.02 — ABNORMAL LOW
RBC: 3.69 — ABNORMAL LOW
RBC: 2.75 — ABNORMAL LOW
RBC: 3.4 — ABNORMAL LOW
RDW: 13.6
WBC: 10.3
WBC: 10.8 — ABNORMAL HIGH
WBC: 13.2 — ABNORMAL HIGH

## 2010-11-20 LAB — DIFFERENTIAL
Basophils Absolute: 0
Basophils Relative: 0
Monocytes Absolute: 0.4
Neutro Abs: 9.6 — ABNORMAL HIGH
Neutrophils Relative %: 81 — ABNORMAL HIGH

## 2010-11-20 LAB — ABO/RH: ABO/RH(D): A NEG

## 2010-11-20 LAB — HEMOGLOBIN AND HEMATOCRIT, BLOOD: Hemoglobin: 14.4

## 2010-11-20 LAB — CROSSMATCH

## 2010-11-20 LAB — APTT: aPTT: 25

## 2011-02-23 HISTORY — PX: ESOPHAGOGASTRODUODENOSCOPY: SHX1529

## 2011-03-11 ENCOUNTER — Encounter (HOSPITAL_COMMUNITY): Payer: Self-pay | Admitting: *Deleted

## 2011-03-11 ENCOUNTER — Emergency Department (HOSPITAL_COMMUNITY)
Admission: EM | Admit: 2011-03-11 | Discharge: 2011-03-12 | Disposition: A | Discharge status: Home / Self Care | Payer: Medicare Other | Attending: Emergency Medicine | Admitting: Emergency Medicine

## 2011-03-11 DIAGNOSIS — W010XXA Fall on same level from slipping, tripping and stumbling without subsequent striking against object, initial encounter: Secondary | ICD-10-CM | POA: Insufficient documentation

## 2011-03-11 DIAGNOSIS — Z7982 Long term (current) use of aspirin: Secondary | ICD-10-CM | POA: Insufficient documentation

## 2011-03-11 DIAGNOSIS — R51 Headache: Secondary | ICD-10-CM | POA: Insufficient documentation

## 2011-03-11 DIAGNOSIS — I10 Essential (primary) hypertension: Secondary | ICD-10-CM | POA: Insufficient documentation

## 2011-03-11 DIAGNOSIS — Z79899 Other long term (current) drug therapy: Secondary | ICD-10-CM | POA: Insufficient documentation

## 2011-03-11 DIAGNOSIS — Z794 Long term (current) use of insulin: Secondary | ICD-10-CM | POA: Insufficient documentation

## 2011-03-11 DIAGNOSIS — E785 Hyperlipidemia, unspecified: Secondary | ICD-10-CM | POA: Insufficient documentation

## 2011-03-11 DIAGNOSIS — E119 Type 2 diabetes mellitus without complications: Secondary | ICD-10-CM | POA: Insufficient documentation

## 2011-03-11 DIAGNOSIS — S0180XA Unspecified open wound of other part of head, initial encounter: Secondary | ICD-10-CM | POA: Insufficient documentation

## 2011-03-11 DIAGNOSIS — S0181XA Laceration without foreign body of other part of head, initial encounter: Secondary | ICD-10-CM

## 2011-03-11 DIAGNOSIS — M25569 Pain in unspecified knee: Secondary | ICD-10-CM | POA: Insufficient documentation

## 2011-03-11 MED ORDER — LIDOCAINE-EPINEPHRINE-TETRACAINE (LET) SOLUTION
3.0000 mL | Freq: Once | NASAL | Status: AC
  Administered 2011-03-11: 3 mL via TOPICAL
  Filled 2011-03-11: qty 3

## 2011-03-11 MED ORDER — ACETAMINOPHEN 325 MG PO TABS
650.0000 mg | ORAL_TABLET | Freq: Once | ORAL | Status: AC
  Administered 2011-03-11: 650 mg via ORAL
  Filled 2011-03-11: qty 2

## 2011-03-11 MED ORDER — TETANUS-DIPHTHERIA TOXOIDS TD 5-2 LFU IM INJ
0.5000 mL | INJECTION | Freq: Once | INTRAMUSCULAR | Status: AC
  Administered 2011-03-11: 0.5 mL via INTRAMUSCULAR
  Filled 2011-03-11: qty 0.5

## 2011-03-11 MED ORDER — LIDOCAINE-EPINEPHRINE (PF) 1 %-1:200000 IJ SOLN
INTRAMUSCULAR | Status: AC
  Filled 2011-03-11: qty 10

## 2011-03-11 NOTE — ED Provider Notes (Signed)
History     CSN: [id number]  Arrival date & time 03/11/11  2308   First MD Initiated Contact with Patient 03/11/11 2316      Chief Complaint  Patient presents with  . Head Laceration    (Consider location/radiation/quality/duration/timing/severity/associated sxs/prior treatment) HPI Comments: Patient reports that he was trying to vacuum in his office and when he turned he thinks he got tripped up perhaps by the cord and went down onto his right knee. He reports he had skinned up his knee he thinks. His forehead hit the edge of the table and sustained a laceration just above the bridge of his nose. The patient denies any loss of consciousness. He reports he has a headache across his forehead rated at a 6/10 pain. He denies loss of consciousness. He did not fall to the ground otherwise. He denies any neck or back pain. He reports his knee pain is minimal, he is able to bend it, he is able to walk on it without difficulty. He is unsure of his last tetanus. He does normally have high blood pressure as well as a history of diabetes. He denies taking aspirin, Plavix, Coumadin, or Xarelto or any other blood thinners.  Patient is a 71 y.o. male presenting with scalp laceration. The history is provided by the patient and a relative.  Head Laceration Associated symptoms include headaches.    Past Medical History  Diagnosis Date  . ASCVD (arteriosclerotic cardiovascular disease)     -luminal irregularities in 1998 and 2005; normal EF  . Diabetes mellitus   . Hypertension   . Hyperlipidemia   . Tobacco abuse     20 pack years; discontinued 1995  . Erectile dysfunction   . Benign prostatic hypertrophy     status post transurethral resection of the prostate    Past Surgical History  Procedure Date  . Transurethral resection of prostate 2009  . Nasal septum surgery     Correction of deviated septum  . Appendectomy     History reviewed. No pertinent family history.  History  Substance  Use Topics  . Smoking status: Former Smoker    Types: Cigarettes    Quit date: 07/07/1993  . Smokeless tobacco: Never Used  . Alcohol Use: No      Review of Systems  Constitutional: Negative.   HENT: Negative for neck pain and neck stiffness.   Gastrointestinal: Negative for nausea and vomiting.  Musculoskeletal: Positive for arthralgias. Negative for back pain.  Skin: Positive for wound. Negative for color change.  Neurological: Positive for headaches. Negative for dizziness and light-headedness.    Allergies  Review of patient's allergies indicates no known allergies.  Home Medications   Current Outpatient Rx  Name Route Sig Dispense Refill  . ASPIRIN 81 MG PO TABS Oral Take 160 mg by mouth daily.    [name] EZETIMIBE-SIMVASTATIN 10-20 MG PO TABS Oral Take 1 tablet by mouth at bedtime. Take 1/2 tab daily     . INSULIN GLARGINE 100 UNIT/ML [place] SOLN Subcutaneous Inject 24 Units into the skin at bedtime.      [name] LISINOPRIL 20 MG PO TABS Oral Take 1 tablet (20 mg total) by mouth daily. 30 tablet 6    Discontinue lisinopril 10mg  daily  . METFORMIN HCL 500 MG PO TABS Oral Take 500 mg by mouth 2 (two) times daily with a meal. 2 tabs bid      . NITROGLYCERIN 0.4 MG SL SUBL Sublingual Place 0.4 mg under the tongue every 5 (  five) minutes as needed.      [name] DOXYCYCLINE HYCLATE 100 MG PO CAPS Oral Take 100 mg by mouth 2 (two) times daily.        BP 167/71  Pulse 69  Temp(Src) 97.7 F (36.5 C) (Oral)  Resp 16  Ht 5\' 7"  (1.702 m)  Wt 175 lb (79.379 kg)  BMI 27.41 kg/m2  SpO2 96%  Physical Exam  Nursing note and vitals reviewed. Constitutional: He is oriented to person, place, and time. He appears well-developed and well-nourished.  HENT:  Head: Normocephalic.    Nose: No mucosal edema, septal deviation or nasal septal hematoma. No epistaxis.  Mouth/Throat: Uvula is midline, oropharynx is clear and moist and mucous membranes are normal.       Linear, but jagged laceration through  dermis above bridge of nose, horizontal about 2.5 cm in length, minimal bleeding at present.  No crepitus or deformity to nose  Eyes: EOM are normal. Pupils are equal, round, and reactive to light.  Neck: Normal range of motion and full passive range of motion without pain. Neck supple. No spinous process tenderness and no muscular tenderness present. Normal range of motion present.  Abdominal: Soft. There is no tenderness.  Musculoskeletal: He exhibits no edema and no tenderness.  Neurological: He is alert and oriented to person, place, and time. No cranial nerve deficit.    ED Course  LACERATION REPAIR Date/Time: 03/11/2011 11:45 PM Performed by: Lear Ng Authorized by: Lear Ng Consent: Verbal consent obtained. Risks and benefits: risks, benefits and alternatives were discussed Consent given by: patient Patient understanding: patient states understanding of the procedure being performed Patient identity confirmed: verbally with patient Time out: Immediately prior to procedure a "time out" was called to verify the correct patient, procedure, equipment, support staff and site/side marked as required. Body area: head/neck Location details: forehead Laceration length: 2.5 cm Foreign bodies: no foreign bodies Nerve involvement: none Vascular damage: no Local anesthetic: LET (lido,epi,tetracaine) and lidocaine 1% with epinephrine Anesthetic total: 3 ml Patient sedated: no Preparation: Patient was prepped and draped in the usual sterile fashion. Irrigation solution: saline Irrigation method: syringe Amount of cleaning: standard Debridement: none Degree of undermining: none Skin closure: 5-0 Prolene Number of sutures: 5 Technique: simple Approximation: close Approximation difficulty: simple Patient tolerance: Patient tolerated the procedure well with no immediate complications. Comments: Some loose skin debrided   (including critical care time)  Labs Reviewed -  No data to display No results found.   1. Laceration of forehead       MDM  Not on blood thinners, no LOC, no N/V, no signs of increased intracranial pressure.  Elevated BP likely due to pain and chronic history. Tylenol given.  Laceration sutured.  Ok to be discharged and can followup as outpt for suture  Removal in 4-5 days.        Gavin Pound. Oletta Lamas, MD 03/12/11 1610

## 2011-03-11 NOTE — ED Notes (Signed)
Pt slipped in the house & hit head on tv. Denies LOC.

## 2011-03-11 NOTE — Discharge Instructions (Signed)
 Facial Laceration A facial laceration is a cut on the face. Lacerations usually heal quickly, but they need special care to reduce scarring. It will take 1 to 2 years for the scar to lose its redness and to heal completely. TREATMENT  Some facial lacerations may not require closure. Some lacerations may not be able to be closed due to an increased risk of infection. It is important to see your caregiver as soon as possible after an injury to minimize the risk of infection and to maximize the opportunity for successful closure. If closure is appropriate, pain medicines may be given, if needed. The wound will be cleaned to help prevent infection. Your caregiver will use stitches (sutures), staples, wound glue (adhesive), or skin adhesive strips to repair the laceration. These tools bring the skin edges together to allow for faster healing and a better cosmetic outcome. However, all wounds will heal with a scar.  Once the wound has healed, scarring can be minimized by covering the wound with sunscreen during the day for 1 full year. Use a sunscreen with an SPF of at least 30. Sunscreen helps to reduce the pigment that will form in the scar. When applying sunscreen to a completely healed wound, massage the scar for a few minutes to help reduce the appearance of the scar. Use circular motions with your fingertips, on and around the scar. Do not massage a healing wound. HOME CARE INSTRUCTIONS For sutures:  Keep the wound clean and dry.   If you were given a bandage (dressing), you should change it at least once a day. Also change the dressing if it becomes wet or dirty, or as directed by your caregiver.   Wash the wound with soap and water 2 times a day. Rinse the wound off with water to remove all soap. Pat the wound dry with a clean towel.   After cleaning, apply a thin layer of the antibiotic ointment recommended by your caregiver. This will help prevent infection and keep the dressing from sticking.    You may shower as usual after the first 24 hours. Do not soak the wound in water until the sutures are removed.   Only take over-the-counter or prescription medicines for pain, discomfort, or fever as directed by your caregiver.   Get your sutures removed as directed by your caregiver. With facial lacerations, sutures should usually be taken out after 4 to 5 days to avoid stitch marks.   Wait a few days after your sutures are removed before applying makeup.  For skin adhesive strips:  Keep the wound clean and dry.   Do not get the skin adhesive strips wet. You may bathe carefully, using caution to keep the wound dry.   If the wound gets wet, pat it dry with a clean towel.   Skin adhesive strips will fall off on their own. You may trim the strips as the wound heals. Do not remove skin adhesive strips that are still stuck to the wound. They will fall off in time.  For wound adhesive:  You may briefly wet your wound in the shower or bath. Do not soak or scrub the wound. Do not swim. Avoid periods of heavy perspiration until the skin adhesive has fallen off on its own. After showering or bathing, gently pat the wound dry with a clean towel.   Do not apply liquid medicine, cream medicine, ointment medicine, or makeup to your wound while the skin adhesive is in place. This may loosen the film  before your wound is healed.   If a dressing is placed over the wound, be careful not to apply tape directly over the skin adhesive. This may cause the adhesive to be pulled off before the wound is healed.   Avoid prolonged exposure to sunlight or tanning lamps while the skin adhesive is in place. Exposure to ultraviolet light in the first year will darken the scar.   The skin adhesive will usually remain in place for 5 to 10 days, then naturally fall off the skin. Do not pick at the adhesive film.  You may need a tetanus shot if:  You cannot remember when you had your last tetanus shot.   You have  never had a tetanus shot.  If you get a tetanus shot, your arm may swell, get red, and feel warm to the touch. This is common and not a problem. If you need a tetanus shot and you choose not to have one, there is a rare chance of getting tetanus. Sickness from tetanus can be serious. SEEK IMMEDIATE MEDICAL CARE IF:  You develop redness, pain, or swelling around the wound.   There is yellowish-white fluid (pus) coming from the wound.   You develop chills or a fever.  MAKE SURE YOU:  Understand these instructions.   Will watch your condition.   Will get help right away if you are not doing well or get worse.  Document Released: 03/18/2004 Document Revised: 10/21/2010 Document Reviewed: 08/03/2010 [facility] Patient Information 2012 [place], [place].     You have had a head injury which does not appear to require admission at this time. A concussion is a state of changed mental ability from trauma. SEEK IMMEDIATE MEDICAL ATTENTION IF: There is confusion or drowsiness (although children frequently become drowsy after injury).  You cannot awaken the injured person.  There is nausea (feeling sick to your stomach) or continued, forceful vomiting.  You notice dizziness or unsteadiness which is getting worse, or inability to walk.  You have convulsions or unconsciousness.  You experience severe, persistent headaches not relieved by Tylenol ?. (Do not take aspirin  as this impairs clotting abilities). Take other pain medications only as directed.  You cannot use arms or legs normally.  There are changes in pupil sizes. (This is the black center in the colored part of the eye)  There is clear or bloody discharge from the nose or ears.  Change in speech, vision, swallowing, or understanding.  Localized weakness, numbness, tingling, or change in bowel or bladder control.

## 2011-03-12 NOTE — ED Notes (Signed)
Patient denies loc and no other complaints other than headache and laceration to head.

## 2011-06-18 ENCOUNTER — Encounter: Payer: Self-pay | Admitting: Internal Medicine

## 2011-06-29 ENCOUNTER — Encounter: Payer: Self-pay | Admitting: Urgent Care

## 2011-06-30 ENCOUNTER — Encounter: Payer: Self-pay | Admitting: Urgent Care

## 2011-06-30 ENCOUNTER — Ambulatory Visit (INDEPENDENT_AMBULATORY_CARE_PROVIDER_SITE_OTHER): Payer: Medicare Other | Admitting: Urgent Care

## 2011-06-30 VITALS — BP 167/76 | HR 79 | Temp 97.2°F | Ht 67.0 in | Wt 181.6 lb

## 2011-06-30 DIAGNOSIS — D126 Benign neoplasm of colon, unspecified: Secondary | ICD-10-CM | POA: Insufficient documentation

## 2011-06-30 DIAGNOSIS — E119 Type 2 diabetes mellitus without complications: Secondary | ICD-10-CM

## 2011-06-30 DIAGNOSIS — R1310 Dysphagia, unspecified: Secondary | ICD-10-CM | POA: Insufficient documentation

## 2011-06-30 MED ORDER — PEG-KCL-NACL-NASULF-NA ASC-C 100 G PO SOLR: 1.0000 | Freq: Once | ORAL | Status: DC

## 2011-06-30 NOTE — Assessment & Plan Note (Signed)
He is a one year history solid food dysphagia. Denies any problems with liquids. Rare heartburn and nausea, exacerbated by certain foods like tomatoes, with some nocturnal symptoms.  Using TUMS sparingly with good results. EGD with possible esophageal dilation for further evaluation to look for esophageal web, ring, stricture, or less likely malignancy.  I have discussed risks & benefits which include, but are not limited to, bleeding, infection, perforation & drug reaction.  The patient agrees with this plan & written consent will be obtained.    May benefit from PPI however given his rare symptoms we'll await EGD findings first.

## 2011-06-30 NOTE — Progress Notes (Signed)
Addended by: Cherene Julian D on: 06/30/2011 10:13 AM   Modules accepted: Orders

## 2011-06-30 NOTE — Progress Notes (Signed)
Faxed to PCP

## 2011-06-30 NOTE — Assessment & Plan Note (Signed)
Marcus Norton is a pleasant 71 y.o. 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 He is due for her 3 year surveillance. He denies any lower GI tract problems. He also has a family history of colon cancer in sister. Colonoscopy with Dr. Jena Gauss in the near future.  I have discussed risks & benefits which include, but are not limited to, bleeding, infection, perforation & drug reaction.  The patient agrees with this plan & written consent will be obtained.

## 2011-06-30 NOTE — Progress Notes (Signed)
Primary Care Physician:  KNOWLTON,STEPHEN D, MD, MD Primary Gastroenterologist:  Dr. Rourk  Chief Complaint  Patient presents with  . Colonoscopy  . Dysphagia    HPI:  Marcus Norton is a 71 y.o. male here to schedule three-year surveillance colonoscopy for history of multiple adenomatous polyps in 2000 and 2010. He also has a family history of colon cancer in his sister. He has been doing well.  He has noticed after a late night snack, he occasionally awakens with nausea in the middle of the night. Rare heartburn.  Symptoms are especially worse after eating tomatoes.  He has tried TUMS twice in the past 1 yr.  Denies vomiting.  He has been chewing his food thoroughly and swelling carefully for the past year. He states his food feels like it gets stuck in his upper esophagus.  No problems with liquids.  Denies odynophagia, early satiety or anorexia.  Denies any lower GI symptoms including constipation, diarrhea, rectal bleeding, melena or weight loss.  Past Medical History  Diagnosis Date  . ASCVD (arteriosclerotic cardiovascular disease)     -luminal irregularities in 1998 and 2005; normal EF  . Diabetes mellitus   . Hypertension   . Hyperlipidemia   . Tobacco abuse     20 pack years; discontinued 1995  . Erectile dysfunction   . Benign prostatic hypertrophy     status post transurethral resection of the prostate  . Adenomatous colon polyp 2000/2010    Past Surgical History  Procedure Date  . Transurethral resection of prostate 2009  . Nasal septum surgery     Correction of deviated septum  . Appendectomy   . Colonoscopy w/ biopsies and polypectomy 07/08/08    Dr Rourk-anal papilla, diminutive rectal polyp ablated the, left-sided diverticula, piecemeal polypectomy of hepatic flexure adenomatous polyp, diminutive polyps near hepatic flexure ablated    Current Outpatient Prescriptions  Medication Sig Dispense Refill  . aspirin 81 MG tablet Take 160 mg by mouth daily.      .  atorvastatin (LIPITOR) 10 MG tablet Take 10 mg by mouth daily.       . insulin glargine (LANTUS) 100 UNIT/ML injection Inject 24 Units into the skin at bedtime.        . lisinopril (PRINIVIL,ZESTRIL) 20 MG tablet Take 1 tablet (20 mg total) by mouth daily.  30 tablet  6  . metFORMIN (GLUCOPHAGE) 500 MG tablet Take 500 mg by mouth 2 (two) times daily with a meal. 2 tabs bid        . nitroGLYCERIN (NITROSTAT) 0.4 MG SL tablet Place 0.4 mg under the tongue every 5 (five) minutes as needed.          Allergies as of 06/30/2011  . (No Known Allergies)    Family History  Problem Relation Age of Onset  . Colon cancer Sister   . Ovarian cancer Mother     History   Social History  . Marital Status: Married    Spouse Name: N/A    Number of Children: 2  . Years of Education: N/A   Occupational History  . retired-farmer, 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   Social History Main Topics  . Smoking status: Former Smoker -- 1.0 packs/day for 20 years    Types: Cigarettes    Quit date: 07/07/1993  . Smokeless tobacco: Never Used  . Alcohol Use: Yes     beer or mixed drink occasionally  . Drug Use: No  .   Sexually Active: Not on file  Review of Systems: Gen: Denies any fever, chills, sweats, anorexia, fatigue, weakness, malaise, weight loss, and sleep disorder CV: Denies chest pain, angina, palpitations, syncope, orthopnea, PND, peripheral edema, and claudication. Resp: Denies dyspnea at rest, dyspnea with exercise, cough, sputum, wheezing, coughing up blood, and pleurisy. GI: Denies vomiting blood, jaundice, and fecal incontinence.    GU : Denies urinary burning, blood in urine, urinary frequency, urinary hesitancy, nocturnal urination, and urinary incontinence. MS: Denies joint pain, limitation of movement, and swelling, stiffness, low back pain, extremity pain. Denies muscle weakness, cramps, atrophy.  Derm: Denies rash, itching, dry skin, hives, moles, warts, or  unhealing ulcers.  Psych: Denies depression, anxiety, memory loss, suicidal ideation, hallucinations, paranoia, and confusion. Heme: Denies bruising, bleeding, and enlarged lymph nodes. Neuro:  Denies any headaches, dizziness, paresthesias. Endo:  Denies any problems with DM, thyroid, adrenal function.  Physical Exam: BP 167/76  Pulse 79  Temp(Src) 97.2 F (36.2 C) (Temporal)  Ht 5' 7" (1.702 m)  Wt 181 lb 9.6 oz (82.373 kg)  BMI 28.44 kg/m2 General:   Alert,  Well-developed, well-nourished, pleasant and cooperative in NAD. Head:  Normocephalic and atraumatic. Eyes:  Sclera clear, no icterus.   Conjunctiva pink. Ears:  Normal auditory acuity. Nose:  No deformity, discharge, or lesions. Mouth:  No deformity or lesions,oropharynx pink & moist. Neck:  Supple; no masses or thyromegaly. Lungs:  Clear throughout to auscultation.   No wheezes, crackles, or rhonchi. No acute distress. Heart:  Regular rate and rhythm; no murmurs, clicks, rubs,  or gallops. Abdomen:  Normal bowel sounds.  No bruits.  Soft, non-tender and non-distended without masses, hepatosplenomegaly or hernias noted.  No guarding or rebound tenderness.   Rectal:  Deferred. Msk:  Symmetrical without gross deformities. Normal posture. Pulses:  Normal pulses noted. Extremities:  No clubbing or edema. Neurologic:  Alert and  oriented x4;  grossly normal neurologically. Skin:  Intact without significant lesions or rashes. Lymph Nodes:  No significant cervical adenopathy. Psych:  Alert and cooperative. Normal mood and affect.     

## 2011-06-30 NOTE — Assessment & Plan Note (Signed)
Take half your Lantus (12 units) the night before your procedures Hold nighttime dose of Glucophage before the procedure  Hold all your diabetes medications and insulin day of procedure Bring all your medications and/or any insulin to the hospital the day of the procedure. Follow blood sugars, call us or your PCP if any problems.

## 2011-06-30 NOTE — Patient Instructions (Signed)
You need a colonoscopy and EGD (upper endoscopy) with Dr. Jena Gauss. He may decide to dilate your esophagus at the same time. Take half your Lantus (12 units) the night before your procedures Cold at nighttime dose of Glucophage before the procedure  Hold all your diabetes medications and insulin day of procedure Bring all your medications and/or any insulin to the hospital the day of the procedure. Follow blood sugars, call us or your PCP if any problems.

## 2011-07-09 ENCOUNTER — Encounter: Payer: Self-pay | Admitting: Cardiology

## 2011-07-09 ENCOUNTER — Ambulatory Visit (INDEPENDENT_AMBULATORY_CARE_PROVIDER_SITE_OTHER): Payer: Medicare Other | Admitting: Cardiology

## 2011-07-09 VITALS — BP 144/70 | HR 96 | Resp 16 | Ht 67.0 in | Wt 173.0 lb

## 2011-07-09 DIAGNOSIS — E119 Type 2 diabetes mellitus without complications: Secondary | ICD-10-CM

## 2011-07-09 DIAGNOSIS — Z87448 Personal history of other diseases of urinary system: Secondary | ICD-10-CM

## 2011-07-09 DIAGNOSIS — Z8639 Personal history of other endocrine, nutritional and metabolic disease: Secondary | ICD-10-CM

## 2011-07-09 DIAGNOSIS — I2510 Atherosclerotic heart disease of native coronary artery without angina pectoris: Secondary | ICD-10-CM

## 2011-07-09 DIAGNOSIS — Z862 Personal history of diseases of the blood and blood-forming organs and certain disorders involving the immune mechanism: Secondary | ICD-10-CM

## 2011-07-09 DIAGNOSIS — N529 Male erectile dysfunction, unspecified: Secondary | ICD-10-CM

## 2011-07-09 DIAGNOSIS — E785 Hyperlipidemia, unspecified: Secondary | ICD-10-CM

## 2011-07-09 DIAGNOSIS — I10 Essential (primary) hypertension: Secondary | ICD-10-CM

## 2011-07-09 NOTE — Patient Instructions (Addendum)
Your physician recommends that you schedule a follow-up appointment in: 1 YEAR  Your physician recommends that you return for lab work in: Pt stating he will have done per Dr Sudie Bailey within the month

## 2011-07-09 NOTE — Progress Notes (Deleted)
**Note De-Identified  Obfuscation** Name: Marcus Norton    DOB: 1940/04/13  Age: 71 y.o.  MR#: [id number]       PCP:  Milana Obey, MD, MD      Insurance: @PAYORNAME @   CC:    Chief Complaint  Patient presents with  . Appointment    pt c/o having upset stomach +med list    VS BP 144/70  Pulse 96  Resp 16  Ht 5\' 7"  (1.702 m)  Wt 173 lb (78.472 kg)  BMI 27.10 kg/m2  Weights Current Weight  07/09/11 173 lb (78.472 kg)  06/30/11 181 lb 9.6 oz (82.373 kg)  03/11/11 175 lb (79.379 kg)    Blood Pressure  BP Readings from Last 3 Encounters:  07/09/11 144/70  06/30/11 167/76  03/11/11 167/71     Admit date:  (Not on file) Last encounter with RMR:  Visit date not found   Allergy No Known Allergies  Current Outpatient Prescriptions  Medication Sig Dispense Refill  . aspirin 81 MG tablet Take 81 mg by mouth daily.       [name] atorvastatin (LIPITOR) 10 MG tablet Take 10 mg by mouth daily.       . insulin glargine (LANTUS) 100 UNIT/ML injection Inject 24 Units into the skin at bedtime.        [name] lisinopril (PRINIVIL,ZESTRIL) 20 MG tablet Take 1 tablet (20 mg total) by mouth daily.  30 tablet  6  . metFORMIN (GLUCOPHAGE) 500 MG tablet Take 1,000 mg by mouth 2 (two) times daily with a meal. 2 tabs bid       . nitroGLYCERIN (NITROSTAT) 0.4 MG SL tablet Place 0.4 mg under the tongue every 5 (five) minutes as needed.          Discontinued Meds:    Medications Discontinued During This Encounter  Medication Reason  . peg 3350 powder (MOVIPREP) 100 G SOLR Error    Patient Active Problem List  Diagnoses  . HYPERLIPIDEMIA  . ERECTILE DYSFUNCTION, ORGANIC  . LIVER FUNCTION TESTS, ABNORMAL, HX OF  . BENIGN PROSTATIC HYPERTROPHY, HX OF, S/P TURP  . ASCVD (arteriosclerotic cardiovascular disease)  . DM (diabetes mellitus)  . Hypertension  . Tobacco abuse  . Colon adenomas  . Dysphagia    LABS No visits with results within 3 Month(s) from this visit. Latest known visit with results is:  [facility] Conversion  Encounter on 04/23/2009  Component Date Value  . Hemoglobin A1C 04/23/2009 9.4   . WBC 04/23/2009 8.0   . Hemoglobin 04/23/2009 15.2   . HCT 04/23/2009 45.2   . MCV 04/23/2009 95.8   . Platelets 04/23/2009 287   . Sodium 04/23/2009 137   . Potassium 04/23/2009 4.5   . Chloride 04/23/2009 99   . CO2 04/23/2009 25   . Glucose, Bld 04/23/2009 122   . BUN 04/23/2009 20   . Creatinine, Ser 04/23/2009 0.74   . Bilirubin, Direct 04/23/2009 0.1   . Alkaline Phosphatase 04/23/2009 61   . AST 04/23/2009 42   . ALT 04/23/2009 62   . Total Protein 04/23/2009 7.7   . Albumin 04/23/2009 4.6   . Calcium 04/23/2009 9.4      Results for this Opt Visit:     Results for orders placed in visit on 04/23/09  CONVERTED [facility] LAB      Component Value Range   Hemoglobin A1C 9.4     WBC 8.0     Hemoglobin 15.2     HCT 45.2 **Note De-Identified  Obfuscation** MCV 95.8     Platelets 287     Sodium 137     Potassium 4.5     Chloride 99     CO2 25     Glucose, Bld 122     BUN 20     Creatinine, Ser 0.74     Bilirubin, Direct 0.1     Alkaline Phosphatase 61     AST 42     ALT 62     Total Protein 7.7     Albumin 4.6     Calcium 9.4      EKG No orders found for this or any previous visit.   Prior Assessment and Plan Problem List as of 07/09/2011          Cardiology Problems   HYPERLIPIDEMIA   Last Assessment & Plan Note   07/09/2010 Office Visit Addendum 07/13/2010  7:05 AM by Kathlen Brunswick, MD    Control of hyperlipidemia is excellent with current medications; however, patient's insurer is attempting to substitute a less expensive agent.  Pravastatin 80 mg q.d might prove adequate if any change is necessary.    ASCVD (arteriosclerotic cardiovascular disease)   Last Assessment & Plan Note   07/09/2010 Office Visit Signed 07/10/2010  8:50 AM by Kathlen Brunswick, MD    Patient remains asymptomatic with no clinical evidence for progression of disease.    Hypertension   Last Assessment & Plan Note   07/09/2010  Office Visit Signed 07/10/2010  8:52 AM by Kathlen Brunswick, MD    Blood pressure control slightly suboptimal at this visit; we will increase lisinopril to 20 mg q.d.      Other   ERECTILE DYSFUNCTION, ORGANIC   LIVER FUNCTION TESTS, ABNORMAL, HX OF   BENIGN PROSTATIC HYPERTROPHY, HX OF, S/P TURP   DM (diabetes mellitus)   Last Assessment & Plan Note   06/30/2011 Office Visit Signed 06/30/2011  9:30 AM by Joselyn Arrow, NP    Take half your Lantus (12 units) the night before your procedures Hold nighttime dose of Glucophage before the procedure  Hold all your diabetes medications and insulin day of procedure Bring all your medications and/or any insulin to the hospital the day of the procedure. Follow blood sugars, call us or your PCP if any problems.    Tobacco abuse   Colon adenomas   Last Assessment & Plan Note   06/30/2011 Office Visit Signed 06/30/2011  9:27 AM by Joselyn Arrow, NP    Marcus Norton is a pleasant 71 y.o. 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 He is due for her 3 year surveillance. He denies any lower GI tract problems. He also has a family history of colon cancer in sister. Colonoscopy with Dr. Jena Gauss in the near future.  I have discussed risks & benefits which include, but are not limited to, bleeding, infection, perforation & drug reaction.  The patient agrees with this plan & written consent will be obtained.       Dysphagia   Last Assessment & Plan Note   06/30/2011 Office Visit Signed 06/30/2011  9:29 AM by Joselyn Arrow, NP    He is a one year history solid food dysphagia. Denies any problems with liquids. Rare heartburn and nausea, exacerbated by certain foods like tomatoes, with some nocturnal symptoms.  Using TUMS sparingly with good results. EGD with possible esophageal dilation for further evaluation to look for esophageal web, ring, stricture, **Note De-Identified  Obfuscation** or less likely malignancy.  I have discussed risks & benefits which include, but are  not limited to, bleeding, infection, perforation & drug reaction.  The patient agrees with this plan & written consent will be obtained.    May benefit from PPI however given his rare symptoms we'll await EGD findings first.        Imaging: No results found.   FRS Calculation: Score not calculated. Missing: Total Cholesterol

## 2011-07-09 NOTE — Assessment & Plan Note (Signed)
Patient appears to have white coat hypertension, which is otherwise well-controlled.  Current medications will be continued.

## 2011-07-09 NOTE — Assessment & Plan Note (Addendum)
Doing well from a symptomatic standpoint.  The chest discomfort he describes does not likely represent myocardial ischemia.  He will notify us if there is any increase in intensity or frequency.

## 2011-07-09 NOTE — Assessment & Plan Note (Addendum)
Recent hemoglobin A1c reported as 8.0 by Marcus Norton, which prompted an increase in his dose of Lantus.  He was congratulated on a nearly 10 pound weight loss and advised that this would help both his diabetes and hypertension.

## 2011-07-09 NOTE — Progress Notes (Signed)
Patient ID: Marcus Norton, male   DOB: 15-Jan-1941, 71 y.o.   MRN: [id number]  HPI: Scheduled return visit for this very nice gentleman who remains active and free of cardiovascular symptoms despite his 71 years of age.  He has had nonobstructive disease at catheterization, most recently in 2005 and has multiple cardiovascular risk factors which have reportedly been in excellent control.  Blood pressure measured outside of healthcare locations is typically approximately 130/70 with no systolics above 140.  He reports good results on lipid testing, but we cannot locate a recent lipid profile.  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  Prior to Admission medications   Medication Sig Start Date End Date Taking? Authorizing Provider  aspirin 81 MG tablet Take 81 mg by mouth daily.    Yes Historical Provider, MD  atorvastatin (LIPITOR) 10 MG tablet Take 10 mg by mouth daily.  06/27/11  Yes Historical Provider, MD  insulin glargine (LANTUS) 100 UNIT/ML injection Inject 24 Units into the skin at bedtime.     Yes Historical Provider, MD  lisinopril (PRINIVIL,ZESTRIL) 20 MG tablet Take 1 tablet (20 mg total) by mouth daily. 07/10/10  Yes Kathlen Brunswick, MD  metFORMIN (GLUCOPHAGE) 500 MG tablet Take 1,000 mg by mouth 2 (two) times daily with a meal. 2 tabs bid    Yes Historical Provider, MD  nitroGLYCERIN (NITROSTAT) 0.4 MG SL tablet Place 0.4 mg under the tongue every 5 (five) minutes as needed.     Yes Historical Provider, MD   No Known Allergies    Past medical history, social history, and family history reviewed and updated.  ROS: Denies dyspnea on exertion, orthopnea, PND, lightheadedness or syncope.  Experiences occasional episodes of vague chest discomfort, typically with exertion, but similar levels of activity or even more intense exercise did not reproduce those symptoms.  He has occasional palpitations lasting a single beat, which cause anxiety but no other significant  symptoms.  PHYSICAL EXAM: BP 144/70  Pulse 96  Resp 16  Ht 5\' 7"  (1.702 m)  Wt 78.472 kg (173 lb)  BMI 27.10 kg/m2 ; weight decreased 9 pounds since his last visit General-Well developed; no acute distress Body habitus-proportionate weight and height Neck-No JVD; no carotid bruits Lungs-clear lung fields; resonant to percussion Cardiovascular-normal PMI; normal S1 and S2 Abdomen-normal bowel sounds; soft and non-tender without masses or organomegaly Musculoskeletal-No deformities, no cyanosis or clubbing Neurologic-Normal cranial nerves; symmetric strength and tone Skin-Warm, no significant lesions Extremities-distal pulses intact; no edema  ASSESSMENT AND PLAN:  [place] Bing, MD 07/09/2011 1:31 PM

## 2011-07-09 NOTE — Assessment & Plan Note (Signed)
I have no repeat lipid profile after her therapy was changed from Vytorin to low dose atorvastatin.  Patient prefers to have this performed at his next office visit with his PCP.

## 2011-07-14 ENCOUNTER — Encounter (HOSPITAL_COMMUNITY): Payer: Self-pay | Admitting: Pharmacy Technician

## 2011-07-20 ENCOUNTER — Encounter (HOSPITAL_COMMUNITY): Payer: Self-pay | Admitting: *Deleted

## 2011-07-20 ENCOUNTER — Ambulatory Visit (HOSPITAL_COMMUNITY)
Admission: RE | Admit: 2011-07-20 | Discharge: 2011-07-20 | Disposition: A | Discharge status: Home / Self Care | Payer: Medicare Other | Source: Ambulatory Visit | Attending: Internal Medicine | Admitting: Internal Medicine

## 2011-07-20 ENCOUNTER — Encounter (HOSPITAL_COMMUNITY)
Admission: RE | Disposition: A | Discharge status: Home / Self Care | Payer: Self-pay | Source: Ambulatory Visit | Attending: Internal Medicine

## 2011-07-20 DIAGNOSIS — D128 Benign neoplasm of rectum: Secondary | ICD-10-CM

## 2011-07-20 DIAGNOSIS — D126 Benign neoplasm of colon, unspecified: Secondary | ICD-10-CM

## 2011-07-20 DIAGNOSIS — E119 Type 2 diabetes mellitus without complications: Secondary | ICD-10-CM | POA: Insufficient documentation

## 2011-07-20 DIAGNOSIS — I2510 Atherosclerotic heart disease of native coronary artery without angina pectoris: Secondary | ICD-10-CM | POA: Insufficient documentation

## 2011-07-20 DIAGNOSIS — Z1211 Encounter for screening for malignant neoplasm of colon: Secondary | ICD-10-CM

## 2011-07-20 DIAGNOSIS — R1310 Dysphagia, unspecified: Secondary | ICD-10-CM | POA: Insufficient documentation

## 2011-07-20 DIAGNOSIS — K449 Diaphragmatic hernia without obstruction or gangrene: Secondary | ICD-10-CM | POA: Insufficient documentation

## 2011-07-20 DIAGNOSIS — Z860100 Personal history of colon polyps, unspecified: Secondary | ICD-10-CM | POA: Insufficient documentation

## 2011-07-20 DIAGNOSIS — K2100 Gastro-esophageal reflux disease with esophagitis, without bleeding: Secondary | ICD-10-CM | POA: Insufficient documentation

## 2011-07-20 DIAGNOSIS — Z7982 Long term (current) use of aspirin: Secondary | ICD-10-CM | POA: Insufficient documentation

## 2011-07-20 DIAGNOSIS — E785 Hyperlipidemia, unspecified: Secondary | ICD-10-CM | POA: Insufficient documentation

## 2011-07-20 DIAGNOSIS — K2980 Duodenitis without bleeding: Secondary | ICD-10-CM | POA: Insufficient documentation

## 2011-07-20 DIAGNOSIS — I10 Essential (primary) hypertension: Secondary | ICD-10-CM | POA: Insufficient documentation

## 2011-07-20 DIAGNOSIS — D129 Benign neoplasm of anus and anal canal: Secondary | ICD-10-CM

## 2011-07-20 DIAGNOSIS — K2940 Chronic atrophic gastritis without bleeding: Secondary | ICD-10-CM | POA: Insufficient documentation

## 2011-07-20 DIAGNOSIS — Z794 Long term (current) use of insulin: Secondary | ICD-10-CM | POA: Insufficient documentation

## 2011-07-20 DIAGNOSIS — Z01812 Encounter for preprocedural laboratory examination: Secondary | ICD-10-CM | POA: Insufficient documentation

## 2011-07-20 HISTORY — DX: Other seasonal allergic rhinitis: J30.2

## 2011-07-20 HISTORY — PX: COLONOSCOPY: SHX5424

## 2011-07-20 LAB — GLUCOSE, CAPILLARY: Glucose-Capillary: 152 mg/dL — ABNORMAL HIGH (ref 70–99)

## 2011-07-20 SURGERY — COLONOSCOPY
Anesthesia: Moderate Sedation

## 2011-07-20 MED ORDER — MEPERIDINE HCL 100 MG/ML IJ SOLN
INTRAMUSCULAR | Status: DC | PRN
  Administered 2011-07-20 (×2): 25 mg via INTRAVENOUS
  Administered 2011-07-20: 50 mg via INTRAVENOUS

## 2011-07-20 MED ORDER — MIDAZOLAM HCL 5 MG/5ML IJ SOLN
INTRAMUSCULAR | Status: DC | PRN
  Administered 2011-07-20 (×2): 1 mg via INTRAVENOUS
  Administered 2011-07-20: 2 mg via INTRAVENOUS
  Administered 2011-07-20: 1 mg via INTRAVENOUS

## 2011-07-20 MED ORDER — SODIUM CHLORIDE 0.45 % IV SOLN
Freq: Once | INTRAVENOUS | Status: AC
Start: 2011-07-20 — End: 2011-07-20
  Administered 2011-07-20: 08:00:00 via INTRAVENOUS

## 2011-07-20 MED ORDER — MEPERIDINE HCL 100 MG/ML IJ SOLN
INTRAMUSCULAR | Status: AC
  Filled 2011-07-20: qty 1

## 2011-07-20 MED ORDER — BUTAMBEN-TETRACAINE-BENZOCAINE 2-2-14 % EX AERO
INHALATION_SPRAY | CUTANEOUS | Status: DC | PRN
  Administered 2011-07-20: 2 via TOPICAL

## 2011-07-20 MED ORDER — MIDAZOLAM HCL 5 MG/5ML IJ SOLN
INTRAMUSCULAR | Status: AC
  Filled 2011-07-20: qty 10

## 2011-07-20 NOTE — H&P (View-Only) (Signed)
Primary Care Physician:  Milana Obey, MD, MD Primary Gastroenterologist:  Dr. Jena Gauss  Chief Complaint  Patient presents with  . Colonoscopy  . Dysphagia    HPI:  Marcus Norton is a 71 y.o. male here to schedule three-year surveillance colonoscopy for history of multiple adenomatous polyps in 2000 and 2010. He also has a family history of colon cancer in his sister. He has been doing well.  He has noticed after a late night snack, he occasionally awakens with nausea in the middle of the night. Rare heartburn.  Symptoms are especially worse after eating tomatoes.  He has tried TUMS twice in the past 1 yr.  Denies vomiting.  He has been chewing his food thoroughly and swelling carefully for the past year. He states his food feels like it gets stuck in his upper esophagus.  No problems with liquids.  Denies odynophagia, early satiety or anorexia.  Denies any lower GI symptoms including constipation, diarrhea, rectal bleeding, melena or weight loss.  Past Medical History  Diagnosis Date  . ASCVD (arteriosclerotic cardiovascular disease)     -luminal irregularities in 1998 and 2005; normal EF  . Diabetes mellitus   . Hypertension   . Hyperlipidemia   . Tobacco abuse     20 pack years; discontinued 1995  . Erectile dysfunction   . Benign prostatic hypertrophy     status post transurethral resection of the prostate  . Adenomatous colon polyp 2000/2010    Past Surgical History  Procedure Date  . Transurethral resection of prostate 2009  . Nasal septum surgery     Correction of deviated septum  . Appendectomy   . Colonoscopy w/ biopsies and polypectomy 07/08/08    Dr Marlowe Alt papilla, diminutive rectal polyp ablated the, left-sided diverticula, piecemeal polypectomy of hepatic flexure adenomatous polyp, diminutive polyps near hepatic flexure ablated    Current Outpatient Prescriptions  Medication Sig Dispense Refill  . aspirin 81 MG tablet Take 160 mg by mouth daily.      [name]  atorvastatin (LIPITOR) 10 MG tablet Take 10 mg by mouth daily.       . insulin glargine (LANTUS) 100 UNIT/ML injection Inject 24 Units into the skin at bedtime.        [name] lisinopril (PRINIVIL,ZESTRIL) 20 MG tablet Take 1 tablet (20 mg total) by mouth daily.  30 tablet  6  . metFORMIN (GLUCOPHAGE) 500 MG tablet Take 500 mg by mouth 2 (two) times daily with a meal. 2 tabs bid        . nitroGLYCERIN (NITROSTAT) 0.4 MG SL tablet Place 0.4 mg under the tongue every 5 (five) minutes as needed.          Allergies as of 06/30/2011  . (No Known Allergies)    Family History  Problem Relation Age of Onset  . Colon cancer Sister   . Ovarian cancer Mother     History   Social History  . Marital Status: Married    Spouse Name: N/A    Number of Children: 2  . Years of Education: N/A   Occupational History  . retired-farmer, 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   Social History Main Topics  . Smoking status: Former Smoker -- 1.0 packs/day for 20 years    Types: Cigarettes    Quit date: 07/07/1993  . Smokeless tobacco: Never Used  . Alcohol Use: Yes     beer or mixed drink occasionally  . Drug Use: No  .  Sexually Active: Not on file  Review of Systems: Gen: Denies any fever, chills, sweats, anorexia, fatigue, weakness, malaise, weight loss, and sleep disorder CV: Denies chest pain, angina, palpitations, syncope, orthopnea, PND, peripheral edema, and claudication. Resp: Denies dyspnea at rest, dyspnea with exercise, cough, sputum, wheezing, coughing up blood, and pleurisy. GI: Denies vomiting blood, jaundice, and fecal incontinence.    GU : Denies urinary burning, blood in urine, urinary frequency, urinary hesitancy, nocturnal urination, and urinary incontinence. MS: Denies joint pain, limitation of movement, and swelling, stiffness, low back pain, extremity pain. Denies muscle weakness, cramps, atrophy.  Derm: Denies rash, itching, dry skin, hives, moles, warts, or  unhealing ulcers.  Psych: Denies depression, anxiety, memory loss, suicidal ideation, hallucinations, paranoia, and confusion. Heme: Denies bruising, bleeding, and enlarged lymph nodes. Neuro:  Denies any headaches, dizziness, paresthesias. Endo:  Denies any problems with DM, thyroid, adrenal function.  Physical Exam: BP 167/76  Pulse 79  Temp(Src) 97.2 F (36.2 C) (Temporal)  Ht 5\' 7"  (1.702 m)  Wt 181 lb 9.6 oz (82.373 kg)  BMI 28.44 kg/m2 General:   Alert,  Well-developed, well-nourished, pleasant and cooperative in NAD. Head:  Normocephalic and atraumatic. Eyes:  Sclera clear, no icterus.   Conjunctiva pink. Ears:  Normal auditory acuity. Nose:  No deformity, discharge, or lesions. Mouth:  No deformity or lesions,oropharynx pink & moist. Neck:  Supple; no masses or thyromegaly. Lungs:  Clear throughout to auscultation.   No wheezes, crackles, or rhonchi. No acute distress. Heart:  Regular rate and rhythm; no murmurs, clicks, rubs,  or gallops. Abdomen:  Normal bowel sounds.  No bruits.  Soft, non-tender and non-distended without masses, hepatosplenomegaly or hernias noted.  No guarding or rebound tenderness.   Rectal:  Deferred. Msk:  Symmetrical without gross deformities. Normal posture. Pulses:  Normal pulses noted. Extremities:  No clubbing or edema. Neurologic:  Alert and  oriented x4;  grossly normal neurologically. Skin:  Intact without significant lesions or rashes. Lymph Nodes:  No significant cervical adenopathy. Psych:  Alert and cooperative. Normal mood and affect.

## 2011-07-20 NOTE — Discharge Instructions (Addendum)
Colonoscopy Discharge Instructions  Read the instructions outlined below and refer to this sheet in the next few weeks. These discharge instructions provide you with general information on caring for yourself after you leave the hospital. Your doctor may also give you specific instructions. While your treatment has been planned according to the most current medical practices available, unavoidable complications occasionally occur. If you have any problems or questions after discharge, call Dr. Jena Gauss at [phone]. ACTIVITY  You may resume your regular activity, but move at a slower pace for the next 24 hours.   Take frequent rest periods for the next 24 hours.   Walking will help get rid of the air and reduce the bloated feeling in your belly (abdomen).   No driving for 24 hours (because of the medicine (anesthesia) used during the test).    Do not sign any important legal documents or operate any machinery for 24 hours (because of the anesthesia used during the test).  NUTRITION  Drink plenty of fluids.   You may resume your normal diet as instructed by your doctor.   Begin with a light meal and progress to your normal diet. Heavy or fried foods are harder to digest and may make you feel sick to your stomach (nauseated).   Avoid alcoholic beverages for 24 hours or as instructed.  MEDICATIONS  You may resume your normal medications unless your doctor tells you otherwise.  WHAT YOU CAN EXPECT TODAY  Some feelings of bloating in the abdomen.   Passage of more gas than usual.   Spotting of blood in your stool or on the toilet paper.  IF YOU HAD POLYPS REMOVED DURING THE COLONOSCOPY:  No aspirin products for 7 days or as instructed.   No alcohol for 7 days or as instructed.   Eat a soft diet for the next 24 hours.  FINDING OUT THE RESULTS OF YOUR TEST Not all test results are available during your visit. If your test results are not back during the visit, make an appointment  with your caregiver to find out the results. Do not assume everything is normal if you have not heard from your caregiver or the medical facility. It is important for you to follow up on all of your test results.  SEEK IMMEDIATE MEDICAL ATTENTION IF:  You have more than a spotting of blood in your stool.   Your belly is swollen (abdominal distention).   You are nauseated or vomiting.   You have a temperature over 101.   You have abdominal pain or discomfort that is severe or gets worse throughout the day.    Polyp information provided.  Further recommendations to follow pending review of pathology report.  EGD Discharge instructions Please read the instructions outlined below and refer to this sheet in the next few weeks. These discharge instructions provide you with general information on caring for yourself after you leave the hospital. Your doctor may also give you specific instructions. While your treatment has been planned according to the most current medical practices available, unavoidable complications occasionally occur. If you have any problems or questions after discharge, please call your doctor. ACTIVITY  You may resume your regular activity but move at a slower pace for the next 24 hours.   Take frequent rest periods for the next 24 hours.   Walking will help expel (get rid of) the air and reduce the bloated feeling in your abdomen.   No driving for 24 hours (because of the anesthesia (medicine)  used during the test).   You may shower.   Do not sign any important legal documents or operate any machinery for 24 hours (because of the anesthesia used during the test).  NUTRITION  Drink plenty of fluids.   You may resume your normal diet.   Begin with a light meal and progress to your normal diet.   Avoid alcoholic beverages for 24 hours or as instructed by your caregiver.  MEDICATIONS  You may resume your normal medications unless your caregiver tells you  otherwise.  WHAT YOU CAN EXPECT TODAY  You may experience abdominal discomfort such as a feeling of fullness or "gas" pains.  FOLLOW-UP  Your doctor will discuss the results of your test with you.  SEEK IMMEDIATE MEDICAL ATTENTION IF ANY OF THE FOLLOWING OCCUR:  Excessive nausea (feeling sick to your stomach) and/or vomiting.   Severe abdominal pain and distention (swelling).   Trouble swallowing.   Temperature over 101 F (37.8 C).   Rectal bleeding or vomiting of blood.   GERD information provided.  Begin protonix  40 mg orally daily  Further recommendations to follow pending review of pathology report  Gastroesophageal Reflux Disease, Adult Gastroesophageal reflux disease (GERD) happens when acid from your stomach flows up into the esophagus. When acid comes in contact with the esophagus, the acid causes soreness (inflammation) in the esophagus. Over time, GERD may create small holes (ulcers) in the lining of the esophagus. CAUSES  Increased body weight. This puts pressure on the stomach, making acid rise from the stomach into the esophagus.  Smoking. This increases acid production in the stomach.  Drinking alcohol. This causes decreased pressure in the lower esophageal sphincter (valve or ring of muscle between the esophagus and stomach), allowing acid from the stomach into the esophagus.  Late evening meals and a full stomach. This increases pressure and acid production in the stomach.  A malformed lower esophageal sphincter.  Sometimes, no cause is found. SYMPTOMS  Burning pain in the lower part of the mid-chest behind the breastbone and in the mid-stomach area. This may occur twice a week or more often.  Trouble swallowing.  Sore throat.  Dry cough.  Asthma-like symptoms including chest tightness, shortness of breath, or wheezing.  DIAGNOSIS  Your caregiver may be able to diagnose GERD based on your symptoms. In some cases, X-rays and other tests may be done to check  for complications or to check the condition of your stomach and esophagus. TREATMENT  Your caregiver may recommend over-the-counter or prescription medicines to help decrease acid production. Ask your caregiver before starting or adding any new medicines.  HOME CARE INSTRUCTIONS  Change the factors that you can control. Ask your caregiver for guidance concerning weight loss, quitting smoking, and alcohol consumption.  Avoid foods and drinks that make your symptoms worse, such as:  Caffeine or alcoholic drinks.  Chocolate.  Peppermint or mint flavorings.  Garlic and onions.  Spicy foods.  Citrus fruits, such as oranges, lemons, or limes.  Tomato-based foods such as sauce, chili, salsa, and pizza.  Fried and fatty foods.  Avoid lying down for the 3 hours prior to your bedtime or prior to taking a nap.  Eat small, frequent meals instead of large meals.  Wear loose-fitting clothing. Do not wear anything tight around your waist that causes pressure on your stomach.  Raise the head of your bed 6 to 8 inches with wood blocks to help you sleep. Extra pillows will not help.  Only take  over-the-counter or prescription medicines for pain, discomfort, or fever as directed by your caregiver.  Do not take aspirin, ibuprofen, or other nonsteroidal anti-inflammatory drugs (NSAIDs).  SEEK IMMEDIATE MEDICAL CARE IF:  You have pain in your arms, neck, jaw, teeth, or back.  Your pain increases or changes in intensity or duration.  You develop nausea, vomiting, or sweating (diaphoresis).  You develop shortness of breath, or you faint.  Your vomit is green, yellow, black, or looks like coffee grounds or blood.  Your stool is red, bloody, or black.  These symptoms could be signs of other problems, such as heart disease, gastric bleeding, or esophageal bleeding. MAKE SURE YOU:  Understand these instructions.  Will watch your condition.  Will get help right away if you are not doing well or get worse.    Document Released: 11/18/2004 Document Revised: 01/28/2011 Document Reviewed: 08/28/2010 [facility] Patient Information 2012 [place], [place].  Hiatal Hernia A hiatal hernia occurs when a part of the stomach slides above the diaphragm. The diaphragm is the thin muscle separating the belly (abdomen) from the chest. A hiatal hernia can be something you are born with or develop over time. Hiatal hernias may allow stomach acid to flow back into your esophagus, the tube which carries food from your mouth to your stomach. If this acid causes problems it is called GERD (gastro-esophageal reflux disease).  SYMPTOMS  Common symptoms of GERD are heartburn (burning in your chest). This is worse when lying down or bending over. It may also cause belching and indigestion. Some of the things which make GERD worse are:  Increased weight pushes on stomach making acid rise more easily.   Smoking markedly increases acid production.   Alcohol decreases lower esophageal sphincter pressure (valve between stomach and esophagus), allowing acid from stomach into esophagus.   Late evening meals and going to bed with a full stomach increases pressure.   Anything that causes an increase in acid production.   Lower esophageal sphincter incompetence.  DIAGNOSIS  Hiatal hernia is often diagnosed with x-rays of your stomach and small bowel. This is called an UGI (upper gastrointestinal x-ray). Sometimes a gastroscopic procedure is done. This is a procedure where your caregiver uses a flexible instrument to look into the stomach and small bowel. HOME CARE INSTRUCTIONS   Try to achieve and maintain an ideal body weight.   Avoid drinking alcoholic beverages.   Stop smoking.   Put the head of your bed on 4 to 6 inch blocks. This will keep your head and esophagus higher than your stomach. If you cannot use blocks, sleep with several pillows under your head and shoulders.   Over-the-counter medications will decrease acid  production. Your caregiver can also prescribe medications for this. Take as directed.   1/2 to 1 teaspoon of an antacid taken every hour while awake, with meals and at bedtime, will neutralize acid.   Do not take aspirin, ibuprofen (Advil or Motrin), or other nonsteroidal anti-inflammatory drugs.   Do not wear tight clothing around your chest or stomach.   Eat smaller meals and eat more frequently. This keeps your stomach from getting too full. Eat slowly.   Do not lie down for 2 or 3 hours after eating. Do not eat or drink anything 1 to 2 hours before going to bed.   Avoid caffeine beverages (colas, coffee, cocoa, tea), fatty foods, citrus fruits and all other foods and drinks that contain acid and that seem to increase the problems.   Avoid  bending over, especially after eating. Also avoid straining during bowel movements or when urinating or lifting things. Anything that increases the pressure in your belly increases the amount of acid that may be pushed up into your esophagus.  SEEK IMMEDIATE MEDICAL CARE IF:  There is change in location (pain in arms, neck, jaw, teeth or back) of your pain, or the pain is getting worse.   You also experience nausea, vomiting, sweating (diaphoresis), or shortness of breath.   You develop continual vomiting, vomit blood or coffee ground material, have bright red blood in your stools, or have black tarry stools.  Some of these symptoms could signal other problems such as heart disease. MAKE SURE YOU:   Understand these instructions.   Monitor your condition.   Contact your caregiver if you are not doing well or are getting worse.  Document Released: 05/01/2003 Document Revised: 01/28/2011 Document Reviewed: 02/08/2005 [facility] Patient Information 2012 [place], [place]. Colon Polyps A polyp is extra tissue that grows inside your body. Colon polyps grow in the large intestine. The large intestine, also called the colon, is part of your digestive  system. It is a long, hollow tube at the end of your digestive tract where your body makes and stores stool. Most polyps are not dangerous. They are benign. This means they are not cancerous. But over time, some types of polyps can turn into cancer. Polyps that are smaller than a pea are usually not harmful. But larger polyps could someday become or may already be cancerous. To be safe, doctors remove all polyps and test them.  WHO GETS POLYPS? Anyone can get polyps, but certain people are more likely than others. You may have a greater chance of getting polyps if: You are over 50.  You have had polyps before.  Someone in your family has had polyps.  Someone in your family has had cancer of the large intestine.  Find out if someone in your family has had polyps. You may also be more likely to get polyps if you:  Eat a lot of fatty foods.  Smoke.  Drink alcohol.  Do not exercise.  Eat too much.  SYMPTOMS  Most small polyps do not cause symptoms. People often do not know they have one until their caregiver finds it during a regular checkup or while testing them for something else. Some people do have symptoms like these: Bleeding from the anus. You might notice blood on your underwear or on toilet paper after you have had a bowel movement.  Constipation or diarrhea that lasts more than a week.  Blood in the stool. Blood can make stool look black or it can show up as red streaks in the stool.  If you have any of these symptoms, see your caregiver. HOW DOES THE DOCTOR TEST FOR POLYPS? The doctor can use four tests to check for polyps: Digital rectal exam. The caregiver wears gloves and checks your rectum (the last part of the large intestine) to see if it feels normal. This test would find polyps only in the rectum. Your caregiver may need to do one of the other tests listed below to find polyps higher up in the intestine.  Barium enema. The caregiver puts a liquid called barium into your rectum  before taking x-rays of your large intestine. Barium makes your intestine look white in the pictures. Polyps are dark, so they are easy to see.  Sigmoidoscopy. With this test, the caregiver can see inside your large intestine. A thin  flexible tube is placed into your rectum. The device is called a sigmoidoscope, which has a light and a tiny video camera in it. The caregiver uses the sigmoidoscope to look at the last third of your large intestine.  Colonoscopy. This test is like sigmoidoscopy, but the caregiver looks at all of the large intestine. It usually requires sedation. This is the most common method for finding and removing polyps.  TREATMENT  The caregiver will remove the polyp during sigmoidoscopy or colonoscopy. The polyp is then tested for cancer.  If you have had polyps, your caregiver may want you to get tested regularly in the future.  PREVENTION  There is not one sure way to prevent polyps. You might be able to lower your risk of getting them if you: Eat more fruits and vegetables and less fatty food.  Do not smoke.  Avoid alcohol.  Exercise every day.  Lose weight if you are overweight.  Eating more calcium and folate can also lower your risk of getting polyps. Some foods that are rich in calcium are milk, cheese, and broccoli. Some foods that are rich in folate are chickpeas, kidney beans, and spinach.  Aspirin might help prevent polyps. Studies are under way.  Document Released: 11/05/2003 Document Revised: 01/28/2011 Document Reviewed: 04/12/2007 [facility] Patient Information 2012 [place], [place].

## 2011-07-20 NOTE — Interval H&P Note (Signed)
History and Physical Interval Note:  07/20/2011 8:24 AM  Marcus Norton  has presented today for surgery, with the diagnosis of hx of polyps and dysphagia  The various methods of treatment have been discussed with the patient and family. After consideration of risks, benefits and other options for treatment, the patient has consented to  Procedure(s) (LRB): COLONOSCOPY (N/A) ESOPHAGOGASTRODUODENOSCOPY (EGD) WITH ESOPHAGEAL DILATION (N/A) as a surgical intervention .  The patients' history has been reviewed, patient examined, no change in status, stable for surgery.  I have reviewed the patients' chart and labs.  Questions were answered to the patient's satisfaction.     Eula Listen

## 2011-07-20 NOTE — Op Note (Signed)
[facility] [address] [place], [place]  [address]  ENDOSCOPY PROCEDURE REPORT  PATIENT:  Marcus Norton, Marcus Norton  MR#:  [id number] BIRTHDATE:  Nov 30, 1940, 71 yrs. old  GENDER:  male  ENDOSCOPIST:  R. Roetta Sessions, MD Caleen Essex Referred by:  Gareth Morgan, M.D.  PROCEDURE DATE:  07/20/2011 PROCEDURE:  EGD with esophageal dilation followed by gastric and small bowel biopsy  INDICATIONS:   esophageal dysphagia.  INFORMED CONSENT:   The risks, benefits, limitations, alternatives and imponderables have been discussed.  The potential for biopsy, esophogeal dilation, etc. have also been reviewed.  Questions have been answered.  All parties agreeable.  Please see the history and physical in the medical record for more information.  MEDICATIONS:     Versed 3 mg IV and Demerol 75 mg IV in divided doses. Cetacaine spray.  DESCRIPTION OF PROCEDURE:   The EG-[id number] ([id number]) endoscope was introduced through the mouth and advanced to the second portion of the duodenum without difficulty or limitations.  The mucosal surfaces were surveyed very carefully during advancement of the scope and upon withdrawal.  Retroflexion view of the proximal stomach and esophagogastric junction was performed.  <<PROCEDUREIMAGES>>  FINDINGS:  Distal esophageal erosions within 5 mm of the GE junction. Grossly patent tubular esophagus. No Barrett's esophagus. Stomach empty;  small hiatal hernia. Large focal area of excoriation located on the posterior gastric wall (image 2). No ulcer or obvious infiltrating process either. The pylorus was patent. Examination of the bulb and second portion of the duodenum demonstrated a single 8 mm nodule (image 4).  THERAPEUTIC / DIAGNOSTIC MANEUVERS PERFORMED:  A 56 French Maloney dilator was passed fully with ase. A look back revealed no apparent complication related to this maneuver. Subsequently, biopsies of the abnormal gastric mucosa and the duodenal bulb  were taken.  COMPLICATIONS:   None  IMPRESSION:   Erosive reflux esophagitis. Status post passage of a Maloney dilator. Hiatal hernia. Focal area of gastric abnormality of uncertain significance-status post biopsy. Duodenal nodule-status post biopsy  RECOMMENDATIONS:    Followup on pathology. See colonoscopy report.  ______________________________ R. Roetta Sessions, MD Caleen Essex  CC:  n. eSIGNED:   R. Roetta Sessions at 07/20/2011 08:53 AM  Sandrea Hughs,   [phone]

## 2011-07-21 NOTE — Op Note (Signed)
[facility] [address] [place], [place]  [address]  COLONOSCOPY PROCEDURE REPORT  PATIENT:  Marcus Norton, Marcus Norton  MR#:  [id number] BIRTHDATE:  11-Nov-1940, 71 yrs. old  GENDER:  male ENDOSCOPIST: Eula Listen REF. BY:  Gareth Morgan, M.D. PROCEDURE DATE:  07/20/2011 PROCEDURE:  Colonoscopy with biopsy, snare polypectomy and polyp ablation  INDICATIONS:  History of colonic adenoma  INFORMED CONSENT:  The risks, benefits, alternatives and imponderables including but not limited to bleeding, perforation as well as the possibility of a missed lesion have been reviewed. The potential for biopsy, lesion removal, etc. have also been discussed.  Questions have been answered.  All parties agreeable. Please see the history and physical in the medical record for more information.  MEDICATIONS:  Versed 5 mg IV and Demerol 100 mg IV in divided doses  DESCRIPTION OF PROCEDURE:  After a digital rectal exam was performed, the EG-[id number] ([id number]) and EC-3890Li ([id number]) colonoscope was advanced from the anus through the rectum and colon to the area of the cecum, ileocecal valve and appendiceal orifice.  The cecum was deeply intubated.  These structures were well-seen and photographed for the record.  From the level of the cecum and ileocecal valve, the scope was slowly and cautiously withdrawn.  The mucosal surfaces were carefully surveyed utilizing scope tip deflection to facilitate fold flattening as needed.  The scope was pulled down into the rectum where a thorough examination including retroflexion was performed. <<PROCEDUREIMAGES>>  FINDINGS: Suboptimal preparation. Multiple diminutive polyps in the rectum and rectosigmoid. Diffuse somewhat polypoid colonic mucosa. 9 mm polyp at the splenic flexure; smaller polyps in the ascending and sigmoid segments The remainder of colonic mucosa appeared normal.  THERAPEUTIC / DIAGNOSTIC MANEUVERS PERFORMED:  Larger polyp at the sigmoid  segment  -  hot snare removed. The remaining polyps were coldld biopsied and removed.  Multiple rectosigmoid and rectal polyps were ablated with the tip of a hot snare loop.  COMPLICATIONS:  None  CECAL WITHDRAWAL TIME:    24 minutes  IMPRESSION:  Colonic polyps-removed as described above  RECOMMENDATIONS:    Followup on pathology. See EGD report.  ______________________________ Eula Listen CC:  Gareth Morgan, M.D.  n. eSIGNED:   R. Roetta Sessions at 07/20/2011 02:23 PM  Sandrea Hughs, [phone]

## 2011-07-22 ENCOUNTER — Encounter (HOSPITAL_COMMUNITY): Payer: Self-pay | Admitting: Internal Medicine

## 2011-07-25 ENCOUNTER — Encounter: Payer: Self-pay | Admitting: Internal Medicine

## 2011-07-26 ENCOUNTER — Encounter: Payer: Self-pay | Admitting: Internal Medicine

## 2012-01-12 ENCOUNTER — Encounter: Payer: Self-pay | Admitting: Cardiology

## 2012-01-12 ENCOUNTER — Ambulatory Visit (INDEPENDENT_AMBULATORY_CARE_PROVIDER_SITE_OTHER): Payer: Medicare Other | Admitting: Cardiology

## 2012-01-12 VITALS — BP 140/60 | HR 76 | Ht 67.0 in | Wt 178.0 lb

## 2012-01-12 DIAGNOSIS — I2510 Atherosclerotic heart disease of native coronary artery without angina pectoris: Secondary | ICD-10-CM

## 2012-01-12 DIAGNOSIS — Z720 Tobacco use: Secondary | ICD-10-CM

## 2012-01-12 DIAGNOSIS — E119 Type 2 diabetes mellitus without complications: Secondary | ICD-10-CM

## 2012-01-12 DIAGNOSIS — F17200 Nicotine dependence, unspecified, uncomplicated: Secondary | ICD-10-CM

## 2012-01-12 DIAGNOSIS — I10 Essential (primary) hypertension: Secondary | ICD-10-CM

## 2012-01-12 DIAGNOSIS — I7090 Unspecified atherosclerosis: Secondary | ICD-10-CM

## 2012-01-12 MED ORDER — NITROGLYCERIN 0.4 MG SL SUBL: 0.4000 mg | SUBLINGUAL_TABLET | SUBLINGUAL | Status: DC | PRN

## 2012-01-12 NOTE — Progress Notes (Signed)
HPI Marcus Norton returns today for evaluation and management of his nonobstructive coronary disease and cardiac risk factors.  He was sitting in a caf having lots the other day. He felt a sudden thump over his left chest. He felt perhaps his heart skip a beat. It only lasted for a second or 2. He has not had any further episodes.  He is fairly active and does not get short of breath to get chest discomfort walking or going up steps. He denies any palpitations with exercise. He denies orthopnea, PND or edema.  His blood sugars have been her poor control. His last A1c was 8.5%. He is compliant with his medications.  Past Medical History  Diagnosis Date  . ASCVD (arteriosclerotic cardiovascular disease)     -luminal irregularities in 1998 and 2005; normal EF  . Diabetes mellitus   . Hypertension   . Hyperlipidemia   . Tobacco abuse     20 pack years; discontinued 1995  . Erectile dysfunction   . Benign prostatic hypertrophy     status post transurethral resection of the prostate  . 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 Dysphagia   . Seasonal allergies     Current Outpatient Prescriptions  Medication Sig Dispense Refill  . aspirin EC 81 MG tablet Take 81 mg by mouth every morning.      [name] atorvastatin (LIPITOR) 10 MG tablet Take 10 mg by mouth every morning.       . diazepam (VALIUM) 10 MG tablet Take 10 mg by mouth at bedtime as needed.       . insulin glargine (LANTUS) 100 UNIT/ML injection Inject 30 Units into the skin at bedtime.       [name] lisinopril (PRINIVIL,ZESTRIL) 20 MG tablet Take 1 tablet (20 mg total) by mouth daily.  30 tablet  6  . metFORMIN (GLUCOPHAGE) 500 MG tablet Take 1,000 mg by mouth 2 (two) times daily with a meal. 2 tabs bid       . nitroGLYCERIN (NITROSTAT) 0.4 MG SL tablet Place 1 tablet (0.4 mg total) under the tongue every 5 (five) minutes x 3 doses as needed.  30 tablet  3  . [DISCONTINUED] nitroGLYCERIN (NITROSTAT) 0.4 MG SL tablet Place 0.4 mg under the  tongue every 5 (five) minutes x 3 doses as needed.         Allergies  Allergen Reactions  . Prednisone Other (See Comments)    Reaction: muscle aches and soreness    Family History  Problem Relation Age of Onset  . Colon cancer Sister   . Ovarian cancer Mother     History   Social History  . Marital Status: Married    Spouse Name: N/A    Number of Children: 2  . Years of Education: N/A   Occupational History  . retired-farmer,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     [employer]   Social History Main Topics  . Smoking status: Former Smoker -- 1.0 packs/day for 20 years    Types: Cigarettes    Quit date: 07/07/1993  . Smokeless tobacco: Never Used  . Alcohol Use: Yes     Comment: beer or mixed drink occasionally  . Drug Use: No  . Sexually Active: Not on file   Other Topics Concern  . Not on file   Social History Narrative  . No narrative on file    ROS ALL NEGATIVE EXCEPT THOSE NOTED IN HPI  PE  General Appearance: well developed, well nourished in no  acute distress, overweight HEENT: symmetrical face, PERRLA, good dentition  Neck: no JVD, thyromegaly, or adenopathy, trachea midline Chest: symmetric without deformity Cardiac: PMI non-displaced, RRR, normal S1, S2, no gallop or murmur Lung: clear to ausculation and percussion Vascular: all pulses full without bruits  Abdominal: nondistended, nontender, good bowel sounds, no HSM, no bruits Extremities: no cyanosis, clubbing or edema, no sign of DVT, no varicosities  Skin: normal color, no rashes Neuro: alert and oriented x 3, non-focal Pysch: normal affect  EKG Normal sinus rhythm, left anterior fascicular block.  BMET    Component Value Date/Time   NA 137 04/23/2009   K 4.5 04/23/2009   CL 99 04/23/2009   CO2 25 04/23/2009   GLUCOSE 122 04/23/2009   BUN 20 04/23/2009   CREATININE 0.74 04/23/2009   CALCIUM 9.4 04/23/2009   GFRNONAA >60 09/30/2007 0819   GFRAA  Value: >60        The eGFR has been calculated  using the MDRD equation. This calculation has not been validated in all clinical 09/30/2007 0819    Lipid Panel     Component Value Date/Time   CHOL 156 03/12/2008   HDL 46 03/12/2008   LDLCALC 91 03/12/2008    CBC    Component Value Date/Time   WBC 8.0 04/23/2009   RBC 3.40* 09/30/2007 0819   HGB 15.2 04/23/2009   HCT 45.2 04/23/2009   PLT 287 04/23/2009   MCV 95.8 04/23/2009   MCHC 33.4 09/30/2007 0819   RDW 14.9 09/30/2007 0819   LYMPHSABS 1.5 09/28/2007 0650   MONOABS 0.4 09/28/2007 0650   EOSABS 0.2 09/28/2007 0650   BASOSABS 0.0 09/28/2007 0650

## 2012-01-12 NOTE — Assessment & Plan Note (Signed)
I've advised him to get his A1c to 7% or less.

## 2012-01-12 NOTE — Assessment & Plan Note (Signed)
Stable. I do not think his symptoms are related to any ischemic heart disease. Continue secondary preventative therapy. Return the office when necessary.

## 2012-01-12 NOTE — Patient Instructions (Addendum)
Your physician recommends that you schedule a follow-up appointment in: ONE YEAR WITH TW  Your physician recommends that you continue on your current medications as directed. Please refer to the Current Medication list given to you today.  

## 2012-01-12 NOTE — Assessment & Plan Note (Signed)
He quit a long time ago and has not restarted. Reinforced that this was very important.

## 2012-05-14 ENCOUNTER — Encounter: Payer: Self-pay | Admitting: Cardiology

## 2012-07-11 ENCOUNTER — Ambulatory Visit (INDEPENDENT_AMBULATORY_CARE_PROVIDER_SITE_OTHER): Payer: Medicare Other | Admitting: Cardiology

## 2012-07-11 ENCOUNTER — Encounter: Payer: Self-pay | Admitting: Cardiology

## 2012-07-11 VITALS — BP 128/58 | HR 87 | Ht 68.0 in | Wt 176.0 lb

## 2012-07-11 DIAGNOSIS — R945 Abnormal results of liver function studies: Secondary | ICD-10-CM

## 2012-07-11 DIAGNOSIS — E785 Hyperlipidemia, unspecified: Secondary | ICD-10-CM

## 2012-07-11 DIAGNOSIS — I2510 Atherosclerotic heart disease of native coronary artery without angina pectoris: Secondary | ICD-10-CM

## 2012-07-11 DIAGNOSIS — I7090 Unspecified atherosclerosis: Secondary | ICD-10-CM

## 2012-07-11 DIAGNOSIS — R7989 Other specified abnormal findings of blood chemistry: Secondary | ICD-10-CM

## 2012-07-11 DIAGNOSIS — I10 Essential (primary) hypertension: Secondary | ICD-10-CM

## 2012-07-11 NOTE — Progress Notes (Signed)
Patient ID: Marcus Norton, male   DOB: 16-Feb-1941, 72 y.o.   MRN: [id number]  HPI: Schedule return visit for this very nice gentleman with multiple cardiovascular risk factors and known coronary artery disease. Since his last visit 6 months ago, has done quite well. He remains active, including doing some farming, with no cardiopulmonary symptoms.  Current Outpatient Prescriptions  Medication Sig Dispense Refill  . aspirin EC 81 MG tablet Take 81 mg by mouth every morning.      [name] atorvastatin (LIPITOR) 10 MG tablet Take 5 mg by mouth every other day.       . diazepam (VALIUM) 10 MG tablet Take 10 mg by mouth at bedtime as needed.       . insulin glargine (LANTUS) 100 UNIT/ML injection Inject 30 Units into the skin at bedtime.       [name] lisinopril (PRINIVIL,ZESTRIL) 20 MG tablet Take 1 tablet (20 mg total) by mouth daily.  30 tablet  6  . metFORMIN (GLUCOPHAGE) 500 MG tablet Take 1,000 mg by mouth 2 (two) times daily with a meal. 2 tabs bid       . nitroGLYCERIN (NITROSTAT) 0.4 MG SL tablet Place 1 tablet (0.4 mg total) under the tongue every 5 (five) minutes x 3 doses as needed.  30 tablet  3  . propranolol (INDERAL) 10 MG tablet        No current facility-administered medications for this visit.   Allergies  Allergen Reactions  . Doxycycline Other (See Comments)    Hyperglycemia  . Oxycontin (Oxycodone Hcl Er) Other (See Comments)    Malaise  . Prednisone Other (See Comments)    Reaction: muscle aches and soreness  . Simvastatin Other (See Comments)    Myalgias     Past medical history, social history, and family history reviewed and updated.  ROS: Denies chest pain, dyspnea, orthopnea, PND, palpitations, lightheadedness or syncope. All of the systems reviewed and are normal.  PHYSICAL EXAM: BP 128/58  Pulse 87  Ht 5\' 8"  (1.727 m)  Wt 79.833 kg (176 lb)  BMI 26.77 kg/m2;  Body mass index is 26.77 kg/(m^2). General-Well developed; no acute distress Body habitus-proportionate  weight and height Neck-No JVD; no carotid bruits Lungs-clear lung fields; resonant to percussion Cardiovascular-normal PMI; normal S1 and S2 Abdomen-normal bowel sounds; soft and non-tender without masses or organomegaly Musculoskeletal-No deformities, no cyanosis or clubbing Neurologic-Normal cranial nerves; symmetric strength and tone Skin-Warm, no significant lesions Extremities-distal pulses intact; no edema  EKG: Normal sinus rhythm at a rate of 87 bpm; left atrial abnormality; left axis deviation; RBBB pattern; minor nonspecific ST segment abnormality; cannot exclude prior septal myocardial infarction; no previous tracing for comparison.  [place] Bing, MD 07/11/2012  4:24 PM  ASSESSMENT AND PLAN

## 2012-07-11 NOTE — Progress Notes (Deleted)
Name: Marcus Norton    DOB: September 05, 1940  Age: 72 y.o.  MR#: [id number]       PCP:  Milana Obey, MD      Insurance: Payor: [employer] MEDICARE / Plan: AARP MEDICARE COMPLETE / Product Type: *No Product type* /   CC:   No chief complaint on file.  NO LIST VS Filed Vitals:   07/11/12 1504  BP: 128/58  Pulse: 87  Height: 5\' 8"  (1.727 m)  Weight: 176 lb (79.833 kg)    Weights Current Weight  07/11/12 176 lb (79.833 kg)  01/12/12 178 lb (80.74 kg)  07/20/11 173 lb (78.472 kg)    Blood Pressure  BP Readings from Last 3 Encounters:  07/11/12 128/58  01/12/12 140/60  07/20/11 159/86     Admit date:  (Not on file) Last encounter with RMR:  Visit date not found   Allergy Doxycycline; Oxycontin; Prednisone; and Simvastatin  Current Outpatient Prescriptions  Medication Sig Dispense Refill  . aspirin EC 81 MG tablet Take 81 mg by mouth every morning.      [name] atorvastatin (LIPITOR) 10 MG tablet Take 10 mg by mouth every morning.       . diazepam (VALIUM) 10 MG tablet Take 10 mg by mouth at bedtime as needed.       . insulin glargine (LANTUS) 100 UNIT/ML injection Inject 30 Units into the skin at bedtime.       [name] lisinopril (PRINIVIL,ZESTRIL) 20 MG tablet Take 1 tablet (20 mg total) by mouth daily.  30 tablet  6  . metFORMIN (GLUCOPHAGE) 500 MG tablet Take 1,000 mg by mouth 2 (two) times daily with a meal. 2 tabs bid       . nitroGLYCERIN (NITROSTAT) 0.4 MG SL tablet Place 1 tablet (0.4 mg total) under the tongue every 5 (five) minutes x 3 doses as needed.  30 tablet  3  . propranolol (INDERAL) 10 MG tablet        No current facility-administered medications for this visit.    Discontinued Meds:   There are no discontinued medications.  Patient Active Problem List   Diagnosis Date Noted  . Colon adenomas 06/30/2011  . Dysphagia 06/30/2011  . Arteriosclerotic cardiovascular disease (ASCVD)   . Diabetes mellitus, type 2   . Hypertension   . Tobacco abuse   .  Hyperlipidemia 07/11/2009  . 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 LIVER FUNCTION TESTS, ABNORMAL, HX OF 07/11/2009  . BENIGN PROSTATIC HYPERTROPHY, HX OF, S/P TURP 07/11/2009    LABS    Component Value Date/Time   NA 137 04/23/2009   NA 136 09/30/2007 0819   NA 135 09/29/2007 1852   K 4.5 04/23/2009   K 4.4 09/30/2007 0819   K 4.2 09/29/2007 1852   CL 99 04/23/2009   CL 97 09/30/2007 0819   CL 104 09/29/2007 1852   CO2 25 04/23/2009   CO2 30 09/30/2007 0819   CO2 25 09/29/2007 1852   GLUCOSE 122 04/23/2009   GLUCOSE 143* 09/30/2007 0819   GLUCOSE 145* 09/29/2007 1852   BUN 20 04/23/2009   BUN 7 09/30/2007 0819   BUN 17 09/29/2007 1852   CREATININE 0.74 04/23/2009   CREATININE 0.74 09/30/2007 0819   CREATININE 0.80 09/29/2007 1852   CALCIUM 9.4 04/23/2009   CALCIUM 8.1* 09/30/2007 0819   CALCIUM 8.2* 09/29/2007 1852   GFRNONAA >60 09/30/2007 0819   GFRNONAA >60 09/29/2007 1852   GFRNONAA >60 09/16/2007 0644   GFRAA  Value: >  60        The eGFR has been calculated using the MDRD equation. This calculation has not been validated in all clinical 09/30/2007 0819   GFRAA  Value: >60        The eGFR has been calculated using the MDRD equation. This calculation has not been validated in all clinical 09/29/2007 1852   GFRAA  Value: >60        The eGFR has been calculated using the MDRD equation. This calculation has not been validated in all clinical 09/16/2007 0644   CMP     Component Value Date/Time   NA 137 04/23/2009   K 4.5 04/23/2009   CL 99 04/23/2009   CO2 25 04/23/2009   GLUCOSE 122 04/23/2009   BUN 20 04/23/2009   CREATININE 0.74 04/23/2009   CALCIUM 9.4 04/23/2009   PROT 7.7 04/23/2009   ALBUMIN 4.6 04/23/2009   AST 42 04/23/2009   ALT 62 04/23/2009   ALKPHOS 61 04/23/2009   GFRNONAA >60 09/30/2007 0819   GFRAA  Value: >60        The eGFR has been calculated using the MDRD equation. This calculation has not been validated in all clinical 09/30/2007 0819       Component Value Date/Time   WBC 8.0 04/23/2009   WBC 10.8* 09/30/2007 0819    WBC 13.2* 09/29/2007 1852   HGB 15.2 04/23/2009   HGB 11.0 RESULT REPEATED AND VERIFIED POST TRANSFUSION SPECIMEN* 09/30/2007 0819   HGB 8.4* 09/29/2007 1852   HCT 45.2 04/23/2009   HCT 33.0* 09/30/2007 0819   HCT 24.5* 09/29/2007 1852   MCV 95.8 04/23/2009   MCV 97.0 09/30/2007 0819   MCV 98.9 09/29/2007 1852    Lipid Panel     Component Value Date/Time   CHOL 156 03/12/2008   HDL 46 03/12/2008   LDLCALC 91 03/12/2008    ABG No results found for this basename: phart, pco2, pco2art, po2, po2art, hco3, tco2, acidbasedef, o2sat     No results found for this basename: TSH   BNP (last 3 results) No results found for this basename: PROBNP,  in the last 8760 hours Cardiac Panel (last 3 results) No results found for this basename: CKTOTAL, CKMB, TROPONINI, RELINDX,  in the last 72 hours  Iron/TIBC/Ferritin No results found for this basename: iron, tibc, ferritin     EKG Orders placed in visit on 07/11/12  . EKG 12-LEAD     Prior Assessment and Plan Problem List as of 07/11/2012   Hyperlipidemia   Last Assessment & Plan   07/09/2011 Office Visit Written 07/09/2011  1:38 PM by Kathlen Brunswick, MD     I have no repeat lipid profile after her therapy was changed from Vytorin to low dose atorvastatin.  Patient prefers to have this performed at his next office visit with his PCP.    ERECTILE DYSFUNCTION, ORGANIC   LIVER FUNCTION TESTS, ABNORMAL, HX OF   BENIGN PROSTATIC HYPERTROPHY, HX OF, S/P TURP   Arteriosclerotic cardiovascular disease (ASCVD)   Last Assessment & Plan   01/12/2012 Office Visit Written 01/12/2012  3:22 PM by Gaylord Shih, MD     Stable. I do not think his symptoms are related to any ischemic heart disease. Continue secondary preventative therapy. Return the office when necessary.    Diabetes mellitus, type 2   Last Assessment & Plan   01/12/2012 Office Visit Written 01/12/2012  3:23 PM by Gaylord Shih, MD     I've advised  him to get his A1c to 7% or less.    Hypertension    Last Assessment & Plan   07/09/2011 Office Visit Written 07/09/2011  1:37 PM by Kathlen Brunswick, MD     Patient appears to have white coat hypertension, which is otherwise well-controlled.  Current medications will be continued.    Tobacco abuse   Last Assessment & Plan   01/12/2012 Office Visit Written 01/12/2012  3:24 PM by Gaylord Shih, MD     He quit a long time ago and has not restarted. Reinforced that this was very important.    Colon adenomas   Last Assessment & Plan   06/30/2011 Office Visit Written 06/30/2011  9:27 AM by Joselyn Arrow, NP     Gustave TELVIN REINDERS is a pleasant 72 y.o. 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 He is due for her 3 year surveillance. He denies any lower GI tract problems. He also has a family history of colon cancer in sister. Colonoscopy with Dr. Jena Gauss in the near future.  I have discussed risks & benefits which include, but are not limited to, bleeding, infection, perforation & drug reaction.  The patient agrees with this plan & written consent will be obtained.       Dysphagia   Last Assessment & Plan   06/30/2011 Office Visit Written 06/30/2011  9:29 AM by Joselyn Arrow, NP     He is a one year history solid food dysphagia. Denies any problems with liquids. Rare heartburn and nausea, exacerbated by certain foods like tomatoes, with some nocturnal symptoms.  Using TUMS sparingly with good results. EGD with possible esophageal dilation for further evaluation to look for esophageal web, ring, stricture, or less likely malignancy.  I have discussed risks & benefits which include, but are not limited to, bleeding, infection, perforation & drug reaction.  The patient agrees with this plan & written consent will be obtained.    May benefit from PPI however given his rare symptoms we'll await EGD findings first.        Imaging: No results found.

## 2012-07-11 NOTE — Assessment & Plan Note (Signed)
Blood pressure control has been excellent with current medications, which will be continued. 

## 2012-07-11 NOTE — Patient Instructions (Addendum)
Your physician recommends that you schedule a follow-up appointment in: ONE YEAR 

## 2012-07-11 NOTE — Assessment & Plan Note (Addendum)
Lipid profile last year indicated adequate control of hyperlipidemia.  Despite patient reporting that he does not tolerate statins due to myalgias, he is currently taking atorvastatin 5 mg every other day. More recent laboratory studies will be sought from patient's PCP.

## 2012-07-11 NOTE — Assessment & Plan Note (Signed)
No reason to suspect progression of disease based on patient's symptoms. We will continue to assist him to optimally control cardiovascular risk factors.

## 2012-07-11 NOTE — Assessment & Plan Note (Signed)
More recent laboratory tests will be sought, but I suspect that abnormalities previously noted were transient.

## 2012-07-30 ENCOUNTER — Encounter: Payer: Self-pay | Admitting: Cardiology

## 2013-04-03 ENCOUNTER — Ambulatory Visit: Payer: Medicare Other | Admitting: Adult Health

## 2013-04-04 ENCOUNTER — Encounter: Payer: Self-pay | Admitting: Physician Assistant

## 2013-04-04 ENCOUNTER — Ambulatory Visit (INDEPENDENT_AMBULATORY_CARE_PROVIDER_SITE_OTHER): Payer: Medicare Other | Admitting: Physician Assistant

## 2013-04-04 ENCOUNTER — Encounter (INDEPENDENT_AMBULATORY_CARE_PROVIDER_SITE_OTHER): Payer: Self-pay

## 2013-04-04 ENCOUNTER — Encounter: Payer: Self-pay | Admitting: *Deleted

## 2013-04-04 VITALS — BP 129/58 | HR 74 | Ht 67.0 in | Wt 180.0 lb

## 2013-04-04 DIAGNOSIS — I499 Cardiac arrhythmia, unspecified: Secondary | ICD-10-CM

## 2013-04-04 DIAGNOSIS — I7090 Unspecified atherosclerosis: Secondary | ICD-10-CM

## 2013-04-04 DIAGNOSIS — R42 Dizziness and giddiness: Secondary | ICD-10-CM

## 2013-04-04 DIAGNOSIS — R002 Palpitations: Secondary | ICD-10-CM

## 2013-04-04 DIAGNOSIS — I10 Essential (primary) hypertension: Secondary | ICD-10-CM

## 2013-04-04 DIAGNOSIS — I459 Conduction disorder, unspecified: Secondary | ICD-10-CM

## 2013-04-04 DIAGNOSIS — I2510 Atherosclerotic heart disease of native coronary artery without angina pectoris: Secondary | ICD-10-CM

## 2013-04-04 NOTE — Assessment & Plan Note (Signed)
Patient has history of nonobstructive coronary artery disease on cath in 2001. He does exercise at the [facility] 4-5 times a week. He's recently had palpitations with dizziness and presyncope. Will order a stress Myoview to rule out ischemia.

## 2013-04-04 NOTE — Progress Notes (Signed)
HPI:   This is a very pleasant 73 year old male patient who is sent to [place] for evaluation by Dr. Karie Kirks. He's previously seen by Dr. Lattie Haw. He has history of nonobstructive coronary artery disease status post cath in 2001. He also has hypertension and diabetes mellitus.  The patient has had occasional palpitations over the years. On 03/16/13 he felt his heart skip 2-3 beats and became very dizzy and presyncopal. He sat down for 30 minutes before he felt better. The following week he had a similar episode but did not become dizzy. He denied any associated chest tightness, pressure, dyspnea, dyspnea on exertion or edema. The patient exercises on a treadmill at the [facility] 4-5 times a week. He says he never gets his heart rate over 111 beats per minute and never has palpitations while exercising. He had a lot of blood work done this morning but doesn't know if his TSH was checked. He mostly drinks decaf drinks but does have a sweetened tea in the afternoon that's probably not decaf.  Allergies  -- Doxycycline -- Other (See Comments)   --  Hyperglycemia  -- Oxycontin [Oxycodone Hcl] -- Other (See Comments)   --  Malaise  -- Prednisone -- Other (See Comments)   --  Reaction: muscle aches and soreness  -- Simvastatin -- Other (See Comments)   --  Myalgias  Current Outpatient Prescriptions on File Prior to Visit: aspirin EC 81 MG tablet, Take 81 mg by mouth every morning., Disp: , Rfl:  atorvastatin (LIPITOR) 10 MG tablet, Take 5 mg by mouth every other day. , Disp: , Rfl:  diazepam (VALIUM) 10 MG tablet, Take 10 mg by mouth at bedtime as needed. , Disp: , Rfl:  insulin glargine (LANTUS) 100 UNIT/ML injection, Inject 30 Units into the skin at bedtime. , Disp: , Rfl:  lisinopril (PRINIVIL,ZESTRIL) 20 MG tablet, Take 1 tablet (20 mg total) by mouth daily., Disp: 30 tablet, Rfl: 6 metFORMIN (GLUCOPHAGE) 500 MG tablet, Take 1,000 mg by mouth 2 (two) times daily with a meal. 2 tabs bid, Disp: , Rfl:   nitroGLYCERIN (NITROSTAT) 0.4 MG SL tablet, Place 1 tablet (0.4 mg total) under the tongue every 5 (five) minutes x 3 doses as needed., Disp: 30 tablet, Rfl: 3 propranolol (INDERAL) 10 MG tablet, Take 10 mg by mouth as needed. , Disp: , Rfl:   No current facility-administered medications on file prior to visit.   Past Medical History:   ASCVD (arteriosclerotic cardiovascular disease)                Comment:-luminal irregularities in 1998 and 2005;               normal EF   Diabetes mellitus                                            Hypertension                                                 Hyperlipidemia  Tobacco abuse                                                  Comment:20 pack years; discontinued 1995   Erectile dysfunction                                         Benign prostatic hypertrophy                                   Comment:status post transurethral resection of the               prostate   Adenomatous colon polyp                         2000/2010    Dysphagia                                                    Seasonal allergies                                          Past Surgical History:   TRANSURETHRAL RESECTION OF PROSTATE              2009         NASAL SEPTUM SURGERY                                            Comment:Correction of deviated septum   APPENDECTOMY                                                  COLONOSCOPY W/ BIOPSIES AND POLYPECTOMY          07/08/08,*     Comment:Dr Rourk-anal papilla, diminutive rectal polyp               ablated the, left-sided diverticula, piecemeal               polypectomy of hepatic flexure adenomatous               polyp, diminutive polyps near hepatic flexure               ablated   COLONOSCOPY                                      07/20/2011      Comment:Procedure: COLONOSCOPY;  Surgeon: Daneil Dolin, MD;  [facility];  Service:  Endoscopy;  Laterality: N/A;  8:30  Review of patient's family history indicates:   Colon cancer                   Sister                   Ovarian cancer                 Mother                   Social History   Marital Status: Married             Spouse Name:                      Years of Education:                 Number of children: 2           Occupational History Occupation          Print production planner, [employer]                     [employer]                                          agric[employer]  Social History Main Topics   Smoking Status: Former Smoker                   Packs/Day: 1.00  Years: 56        Types: Cigarettes     Quit date: 07/07/1993   Smokeless Status: Never Used                       Alcohol Use: Yes               Comment: beer or mixed drink occasionally   Drug Use: No             Sexual Activity: Not on file        Other Topics            Concern   None on file  Social History Narrative   None on file    ROS: See history of present illness otherwise negative   PHYSICAL EXAM: Well-nournished, in no acute distress. Neck: No JVD, HJR, Bruit, or thyroid enlargement  Lungs: No tachypnea, clear without wheezing, rales, or rhonchi  Cardiovascular: RRR, PMI not displaced, heart sounds normal, no murmurs, gallops, bruit, thrill, or heave.  Abdomen: BS normal. Soft without organomegaly, masses, lesions or tenderness.  Extremities: without cyanosis, clubbing or edema. Good distal pulses bilateral  SKin: Warm, no lesions or rashes   Musculoskeletal: No deformities  Neuro: no focal signs  BP 129/58  Pulse 74  Ht 5\' 7"  (1.702 m)  Wt 180 lb (81.647 kg)  BMI 28.19 kg/m2   EKG: Normal sinus rhythm with incomplete right bundle branch block consider old septal infarct, no acute change from last EKG  Left ventriculogram:  Wall motion is normal.  Ejection fraction is estimated at 65%.  There is a trace mitral  regurgitation.   Coronary arteriography (right dominant): 1. The left main has  diffuse 20% stenosis. 2. The LAD has a proximal 25% stenosis.  It gives rise to a large first    diagonal branch. 3. The left circumflex has a distal 25% stenosis.  It gives rise to a normal    sized ramus intermedius which has a 25% stenosis proximally.  There is a    small OM-1, a normal sized OM-2 which has a 25% stenosis in the mid vessel.    The OM-3 is small. 4. The right coronary artery has, what appears to be, a 50% stenosis at its    ostium with a filling defect, most likely secondary to calcification.    There is a diffuse 30% stenosis in the proximal right coronary artery.    The right coronary artery gives rise to a large acute marginal branch,    supplying a portion of the inferior septum.  There is a small posterior    descending artery and a small posterolateral branch.   Intravascular ultrasound of the right coronary artery revealed mild diffuse concentric plaquing of the proximal right coronary artery.  In the ostium of the right coronary artery is a focal area of calcification with a diameter stenosis which measured to be 30% diameter stenosis at the ostium.  The area of stenosis was approximately 50%.   IMPRESSIONS: 1. Normal left ventricular systolic function. 2. Nonobstructive coronary artery disease as described.  There is disease in    the proximal right coronary artery; however, intravascular ultrasound    confirmed the presence of a significant calcification but nonobstructive    stenosis.   PLAN:  Recommendations for aggressive medical therapy and risk factor reduction. DD:  07/17/99 TD:  07/19/99 Job: [id number]

## 2013-04-04 NOTE — Patient Instructions (Signed)
Your physician recommends that you schedule a follow-up appointment in: ONE MONTH WITH [name]   Your physician has recommended that you wear an event monitor. Event monitors are medical devices that record the heart's electrical activity. Doctors most often [place] these monitors to diagnose arrhythmias. Arrhythmias are problems with the speed or rhythm of the heartbeat. The monitor is a small, portable device. You can wear one while you do your normal daily activities. This is usually used to diagnose what is causing palpitations/syncope (passing out).  Your physician has requested that you have en exercise stress myoview. For further information please visit [email or web address]. Please follow instruction sheet, as given.  WE WILL CALL YOU WITH YOUR TEST RESULTS/INSTRUCTIONS/NEXT STEPS ONCE RECEIVED BY THE PROVIDER

## 2013-04-04 NOTE — Assessment & Plan Note (Signed)
Patient complains of more significant palpitations associated with dizziness and presyncope. Labs were ordered by Dr. Karie Kirks this morning. Hopefully a TSH was included. We'll place an event recorder to try to capture what he is having. I told him he can take an Inderal for the palpitations. He takes this only when he has to speak at an event. Followup with Dr.Koneswaran in 1 month.

## 2013-04-04 NOTE — Assessment & Plan Note (Signed)
Controlled.  

## 2013-04-11 DIAGNOSIS — R42 Dizziness and giddiness: Secondary | ICD-10-CM

## 2013-04-11 DIAGNOSIS — R002 Palpitations: Secondary | ICD-10-CM

## 2013-04-18 ENCOUNTER — Encounter (HOSPITAL_COMMUNITY)
Admission: RE | Admit: 2013-04-18 | Discharge: 2013-04-18 | Disposition: A | Discharge status: Home / Self Care | Payer: Medicare Other | Source: Ambulatory Visit | Attending: Physician Assistant | Admitting: Physician Assistant

## 2013-04-18 ENCOUNTER — Encounter (HOSPITAL_COMMUNITY): Payer: Self-pay

## 2013-04-18 DIAGNOSIS — R002 Palpitations: Secondary | ICD-10-CM

## 2013-04-18 DIAGNOSIS — R42 Dizziness and giddiness: Secondary | ICD-10-CM | POA: Insufficient documentation

## 2013-04-18 DIAGNOSIS — I459 Conduction disorder, unspecified: Secondary | ICD-10-CM

## 2013-04-18 DIAGNOSIS — I499 Cardiac arrhythmia, unspecified: Secondary | ICD-10-CM | POA: Insufficient documentation

## 2013-04-18 MED ORDER — SODIUM CHLORIDE 0.9 % IJ SOLN
INTRAMUSCULAR | Status: AC
  Administered 2013-04-18: 10 mL via INTRAVENOUS
  Filled 2013-04-18: qty 10

## 2013-04-18 MED ORDER — REGADENOSON 0.4 MG/5ML IV SOLN
INTRAVENOUS | Status: AC
  Filled 2013-04-18: qty 5

## 2013-04-18 MED ORDER — TECHNETIUM TC 99M SESTAMIBI - CARDIOLITE
10.0000 | Freq: Once | INTRAVENOUS | Status: AC | PRN
  Administered 2013-04-18: 10 via INTRAVENOUS

## 2013-04-18 MED ORDER — TECHNETIUM TC 99M SESTAMIBI GENERIC - CARDIOLITE
30.0000 | Freq: Once | INTRAVENOUS | Status: AC | PRN
  Administered 2013-04-18: 30 via INTRAVENOUS

## 2013-04-18 NOTE — Progress Notes (Signed)
Stress Lab Nurses Notes - [name]  Marcus Norton 04/18/2013 Reason for doing test: Palpitation & dizzy Type of test: Stress Cardiolite Nurse performing test: Gerrit Halls, RN Nuclear Medicine Tech: Melburn Hake Echo Tech: Not Applicable MD performing test: Koneswaran/M.Lenze PA Family [name]  Test explained and consent signed: yes IV started: 22g jelco, Saline lock flushed, No redness or edema and Saline lock started in radiology Symptoms: fatigue Treatment/Intervention: None Reason test stopped: fatigue in legs After recovery IV was: Discontinued via X-ray tech and No redness or edema Patient to return to Nuc. Med at : 12:00 Patient discharged: Home Patient's Condition upon discharge was: stable Comments: During test peak BP 177/66 & HR 134.  Recovery BP 102/53 & HR 108.  Symptoms resolved in recovery. [name] T

## 2013-05-14 ENCOUNTER — Telehealth: Payer: Self-pay | Admitting: *Deleted

## 2013-05-14 NOTE — Telephone Encounter (Signed)
Ecardio EOS report in Dr Sheilah Mins folder for review.

## 2013-06-04 ENCOUNTER — Encounter: Payer: Self-pay | Admitting: Cardiovascular Disease

## 2013-06-04 ENCOUNTER — Other Ambulatory Visit: Payer: Self-pay | Admitting: *Deleted

## 2013-06-04 ENCOUNTER — Ambulatory Visit (INDEPENDENT_AMBULATORY_CARE_PROVIDER_SITE_OTHER): Payer: Medicare Other | Admitting: Cardiovascular Disease

## 2013-06-04 VITALS — BP 171/61 | HR 68 | Ht 67.0 in | Wt 177.0 lb

## 2013-06-04 DIAGNOSIS — I10 Essential (primary) hypertension: Secondary | ICD-10-CM

## 2013-06-04 DIAGNOSIS — I2510 Atherosclerotic heart disease of native coronary artery without angina pectoris: Secondary | ICD-10-CM

## 2013-06-04 DIAGNOSIS — R002 Palpitations: Secondary | ICD-10-CM

## 2013-06-04 DIAGNOSIS — R42 Dizziness and giddiness: Secondary | ICD-10-CM

## 2013-06-04 DIAGNOSIS — I459 Conduction disorder, unspecified: Secondary | ICD-10-CM

## 2013-06-04 DIAGNOSIS — Z136 Encounter for screening for cardiovascular disorders: Secondary | ICD-10-CM

## 2013-06-04 DIAGNOSIS — I7090 Unspecified atherosclerosis: Secondary | ICD-10-CM

## 2013-06-04 DIAGNOSIS — E785 Hyperlipidemia, unspecified: Secondary | ICD-10-CM

## 2013-06-04 DIAGNOSIS — E119 Type 2 diabetes mellitus without complications: Secondary | ICD-10-CM

## 2013-06-04 DIAGNOSIS — IMO0001 Reserved for inherently not codable concepts without codable children: Secondary | ICD-10-CM

## 2013-06-04 NOTE — Patient Instructions (Signed)
Your physician recommends that you schedule a follow-up appointment in: 3 -4 months   Your physician recommends that you continue on your current medications as directed. Please refer to the Current Medication list given to you today.     Thank you for choosing [facility] !   

## 2013-06-04 NOTE — Progress Notes (Signed)
Patient ID: Marcus Norton, male   DOB: 08-03-1940, 73 y.o.   MRN: [id number]      SUBJECTIVE: The patient is a 73 year old male with a history of nonobstructive coronary artery disease, hypertension, and diabetes mellitus. He was most recently seen in February at which time he was complaining of palpitations with episodic dizziness. A stress test and event monitor were ordered. The nuclear stress test was normal. His event monitor showed sinus rhythm and sinus tachycardia with occasional PACs. There was one episode where he experienced chest pressure which was associated with paroxysmal supraventricular tachycardia, heart rate 164 beats per minute. He has been feeling very well over the past several weeks. He has not had to use propranolol. He has cut down caffeine consumption and he thinks this has helped to alleviate his symptoms. He said his blood pressure is normally controlled, having been checked at an office visit last week and was found to be 110/60.    Allergies  Allergen Reactions  . Doxycycline Other (See Comments)    Hyperglycemia  . Oxycontin [Oxycodone Hcl] Other (See Comments)    Malaise  . Prednisone Other (See Comments)    Reaction: muscle aches and soreness  . Simvastatin Other (See Comments)    Myalgias    Current Outpatient Prescriptions  Medication Sig Dispense Refill  . aspirin EC 81 MG tablet Take 81 mg by mouth every morning.      [name] atorvastatin (LIPITOR) 10 MG tablet Take 5 mg by mouth every other day.       . diazepam (VALIUM) 10 MG tablet Take 10 mg by mouth at bedtime as needed.       . insulin glargine (LANTUS) 100 UNIT/ML injection Inject 30 Units into the skin at bedtime.       [name] lisinopril (PRINIVIL,ZESTRIL) 20 MG tablet Take 1 tablet (20 mg total) by mouth daily.  30 tablet  6  . metFORMIN (GLUCOPHAGE) 500 MG tablet Take 1,000 mg by mouth 2 (two) times daily with a meal. 2 tabs bid       . naproxen (NAPROSYN) 500 MG tablet       . nitroGLYCERIN  (NITROSTAT) 0.4 MG SL tablet Place 1 tablet (0.4 mg total) under the tongue every 5 (five) minutes x 3 doses as needed.  30 tablet  3  . propranolol (INDERAL) 10 MG tablet Take 10 mg by mouth as needed.        No current facility-administered medications for this visit.    Past Medical History  Diagnosis Date  . ASCVD (arteriosclerotic cardiovascular disease)     -luminal irregularities in 1998 and 2005; normal EF  . Diabetes mellitus   . Hypertension   . Hyperlipidemia   . Tobacco abuse     20 pack years; discontinued 1995  . Erectile dysfunction   . Benign prostatic hypertrophy     status post transurethral resection of the prostate  . 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 Dysphagia   . Seasonal allergies     Past Surgical History  Procedure Laterality Date  . Transurethral resection of prostate  2009  . Nasal septum surgery      Correction of deviated septum  . Appendectomy    . Colonoscopy w/ biopsies and polypectomy  07/08/08, 06/2011    Dr Madolyn Frieze papilla, diminutive rectal polyp ablated the, left-sided diverticula, piecemeal polypectomy of hepatic flexure adenomatous polyp, diminutive polyps near hepatic flexure ablated  . Colonoscopy  07/20/2011    Procedure: COLONOSCOPY;  Surgeon: Daneil Dolin, MD;  Location: [facility];  Service: Endoscopy;  Laterality: N/A;  8:30    History   Social History  . Marital Status: Married    Spouse Name: N/A    Number of Children: 2  . Years of Education: N/A   Occupational History  . 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 Smoking status: Former Smoker -- 1.00 packs/day for 20 years    Types: Cigarettes    Quit date: 07/07/1993  . Smokeless tobacco: Never Used  . Alcohol Use: Yes     Comment: beer or mixed drink occasionally  . Drug Use: No  . Sexual Activity: Not on file   Other Topics Concern  . Not on file   Social History Narrative  . No  narrative on file     Filed Vitals:   06/04/13 0808  BP: 171/61  Pulse: 68  Height: 5\' 7"  (1.702 m)  Weight: 177 lb (80.287 kg)    PHYSICAL EXAM General: NAD Neck: No JVD, no thyromegaly. Lungs: Clear to auscultation bilaterally with normal respiratory effort. CV: Nondisplaced PMI.  Regular rate and rhythm, normal S1/S2, no S3/S4, no murmur. No pretibial or periankle edema.  No carotid bruit.  Normal pedal pulses.  Abdomen: Soft, nontender, no hepatosplenomegaly, no distention.  Neurologic: Alert and oriented x 3.  Psych: Normal affect. Extremities: No clubbing or cyanosis.   ECG: reviewed and available in electronic records.      ASSESSMENT AND PLAN: 1. Palpitations: He has been asymptomatic over the last several weeks, after cutting down caffeine consumption. I instructed him to take propranolol as needed for palpitations. He said he primarily uses this prior to public speaking engagements. His event monitor showed symptoms which correlated with sinus rhythm, sinus tachycardia with PACs, and one episode of paroxysmal SVT. 2. HTN: Elevated today, but it is normally controlled. He said he has white coat hypertension and was anxious about this morning's visit. Will continue to monitor this. 3. Nonobstructive CAD: His nuclear myocardial perfusion study was normal. No further testing is indicated. 4. Hyperlipidemia: On Lipitor 5 mg every other day, and managed by his PCP.  Dispo: f/u 3-4 months.  Kate Sable, M.D., F.A.C.C.

## 2013-06-27 ENCOUNTER — Other Ambulatory Visit (HOSPITAL_COMMUNITY): Payer: Self-pay | Admitting: Family Medicine

## 2013-06-27 ENCOUNTER — Ambulatory Visit (HOSPITAL_COMMUNITY)
Admission: RE | Admit: 2013-06-27 | Discharge: 2013-06-27 | Disposition: A | Discharge status: Home / Self Care | Payer: Medicare Other | Source: Ambulatory Visit | Attending: Family Medicine | Admitting: Family Medicine

## 2013-06-27 DIAGNOSIS — J189 Pneumonia, unspecified organism: Secondary | ICD-10-CM

## 2013-06-27 DIAGNOSIS — R918 Other nonspecific abnormal finding of lung field: Secondary | ICD-10-CM | POA: Insufficient documentation

## 2013-09-03 ENCOUNTER — Ambulatory Visit (INDEPENDENT_AMBULATORY_CARE_PROVIDER_SITE_OTHER): Payer: Medicare Other | Admitting: Cardiovascular Disease

## 2013-09-03 ENCOUNTER — Encounter: Payer: Self-pay | Admitting: Cardiovascular Disease

## 2013-09-03 VITALS — BP 144/66 | HR 80 | Ht 67.0 in | Wt 177.0 lb

## 2013-09-03 DIAGNOSIS — I471 Supraventricular tachycardia, unspecified: Secondary | ICD-10-CM

## 2013-09-03 DIAGNOSIS — I2510 Atherosclerotic heart disease of native coronary artery without angina pectoris: Secondary | ICD-10-CM

## 2013-09-03 DIAGNOSIS — I10 Essential (primary) hypertension: Secondary | ICD-10-CM

## 2013-09-03 DIAGNOSIS — E785 Hyperlipidemia, unspecified: Secondary | ICD-10-CM

## 2013-09-03 DIAGNOSIS — I7090 Unspecified atherosclerosis: Secondary | ICD-10-CM

## 2013-09-03 DIAGNOSIS — R002 Palpitations: Secondary | ICD-10-CM

## 2013-09-03 NOTE — Patient Instructions (Signed)
Your physician wants you to follow-up in: 6 months You will receive a reminder letter in the mail two months in advance. If you don't receive a letter, please call our office to schedule the follow-up appointment.     Your physician recommends that you continue on your current medications as directed. Please refer to the Current Medication list given to you today.      Thank you for choosing [facility] !        

## 2013-09-03 NOTE — Progress Notes (Signed)
Patient ID: Marcus Norton, male   DOB: 1941/02/16, 73 y.o.   MRN: [id number]      SUBJECTIVE: The patient is here to followup for paroxysmal supraventricular tachycardia, nonobstructive coronary artery disease, hypertension and hyperlipidemia. He has been doing well from a cardiovascular standpoint, denying chest pain and palpitations. He has a hiatal hernia and occasionally gets indigestion from this. He has been recovering from a pneumonia earlier this year.    Allergies  Allergen Reactions  . Oxycontin [Oxycodone Hcl] Other (See Comments)    Malaise  . Prednisone Other (See Comments)    Reaction: muscle aches and soreness  . Simvastatin Other (See Comments)    Myalgias    Current Outpatient Prescriptions  Medication Sig Dispense Refill  . aspirin EC 81 MG tablet Take 81 mg by mouth every morning.      [name] atorvastatin (LIPITOR) 10 MG tablet Take 5 mg by mouth every other day.       . diazepam (VALIUM) 10 MG tablet Take 10 mg by mouth at bedtime as needed.       . insulin glargine (LANTUS) 100 UNIT/ML injection Inject 30 Units into the skin at bedtime.       [name] lisinopril (PRINIVIL,ZESTRIL) 20 MG tablet Take 1 tablet (20 mg total) by mouth daily.  30 tablet  6  . metFORMIN (GLUCOPHAGE) 500 MG tablet Take 1,000 mg by mouth 2 (two) times daily with a meal. 2 tabs bid       . naproxen (NAPROSYN) 500 MG tablet       . nitroGLYCERIN (NITROSTAT) 0.4 MG SL tablet Place 1 tablet (0.4 mg total) under the tongue every 5 (five) minutes x 3 doses as needed.  30 tablet  3  . propranolol (INDERAL) 10 MG tablet Take 10 mg by mouth as needed.        No current facility-administered medications for this visit.    Past Medical History  Diagnosis Date  . ASCVD (arteriosclerotic cardiovascular disease)     -luminal irregularities in 1998 and 2005; normal EF  . Diabetes mellitus   . Hypertension   . Hyperlipidemia   . Tobacco abuse     20 pack years; discontinued 1995  . Erectile dysfunction    . Benign prostatic hypertrophy     status post transurethral resection of the prostate  . 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 Dysphagia   . Seasonal allergies     Past Surgical History  Procedure Laterality Date  . Transurethral resection of prostate  2009  . Nasal septum surgery      Correction of deviated septum  . Appendectomy    . Colonoscopy w/ biopsies and polypectomy  07/08/08, 06/2011    Dr Madolyn Frieze papilla, diminutive rectal polyp ablated the, left-sided diverticula, piecemeal polypectomy of hepatic flexure adenomatous polyp, diminutive polyps near hepatic flexure ablated  . Colonoscopy  07/20/2011    Procedure: COLONOSCOPY;  Surgeon: Daneil Dolin, MD;  Location: [facility];  Service: Endoscopy;  Laterality: N/A;  8:30    History   Social History  . Marital Status: Married    Spouse Name: N/A    Number of Children: 2  . Years of Education: N/A   Occupational History  . 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 Smoking status: Former Smoker -- 1.00 packs/day for 20 years    Types: Cigarettes    Quit date: 07/07/1993  .  Smokeless tobacco: Never Used  . Alcohol Use: Yes     Comment: beer or mixed drink occasionally  . Drug Use: No  . Sexual Activity: Not on file   Other Topics Concern  . Not on file   Social History Narrative  . No narrative on file     Filed Vitals:   09/03/13 1254  BP: 144/66  Pulse: 80  Height: 5\' 7"  (1.702 m)  Weight: 177 lb (80.287 kg)  SpO2: 99%    PHYSICAL EXAM General: NAD Neck: No JVD, no thyromegaly. Lungs: Clear to auscultation bilaterally with normal respiratory effort. CV: Nondisplaced PMI.  Regular rate and rhythm, normal S1/S2, no S3/S4, no murmur. No pretibial or periankle edema.  No carotid bruit.  Normal pedal pulses.  Abdomen: Soft, nontender, no hepatosplenomegaly, no distention.  Neurologic: Alert and oriented x 3.  Psych: Normal  affect. Extremities: No clubbing or cyanosis.   ECG: reviewed and available in electronic records.      ASSESSMENT AND PLAN: 1. Palpitations/PSVT: Symptomatically stable. I instructed him to take propranolol as needed for palpitations. A previous event monitor showed symptoms which correlated with sinus rhythm, sinus tachycardia with PACs, and one episode of paroxysmal SVT.  2. HTN: Very mildly elevated today, but it is normally controlled. He said he has white coat hypertension. 3. Nonobstructive CAD: Prior nuclear myocardial perfusion study was normal. No further testing is indicated.  4. Hyperlipidemia: On Lipitor 5 mg every other day, and managed by his PCP.   Dispo: f/u 6 months.  Kate Sable, M.D., F.A.C.C.

## 2014-02-08 ENCOUNTER — Other Ambulatory Visit (HOSPITAL_COMMUNITY): Payer: Self-pay | Admitting: Family Medicine

## 2014-02-08 DIAGNOSIS — I739 Peripheral vascular disease, unspecified: Secondary | ICD-10-CM

## 2014-02-12 ENCOUNTER — Other Ambulatory Visit (HOSPITAL_COMMUNITY): Payer: Self-pay | Admitting: Family Medicine

## 2014-02-12 ENCOUNTER — Ambulatory Visit (HOSPITAL_COMMUNITY)
Admission: RE | Admit: 2014-02-12 | Discharge: 2014-02-12 | Disposition: A | Discharge status: Home / Self Care | Payer: Medicare Other | Source: Ambulatory Visit | Attending: Family Medicine | Admitting: Family Medicine

## 2014-02-12 DIAGNOSIS — I739 Peripheral vascular disease, unspecified: Secondary | ICD-10-CM

## 2014-02-12 DIAGNOSIS — I771 Stricture of artery: Secondary | ICD-10-CM | POA: Diagnosis not present

## 2014-02-18 ENCOUNTER — Telehealth: Payer: Self-pay | Admitting: Cardiovascular Disease

## 2014-02-18 DIAGNOSIS — R6889 Other general symptoms and signs: Secondary | ICD-10-CM

## 2014-02-18 DIAGNOSIS — M79606 Pain in leg, unspecified: Secondary | ICD-10-CM

## 2014-02-18 NOTE — Telephone Encounter (Signed)
Received a phone call from Dr. Leslie Andrea, as patient has been having progressive bilateral leg fatigue for which ABI's were obtained, and were reportedly markedly abnormal, particularly with exercise. I will arrange for the patient to be evaluated by Dr. Fletcher Anon.

## 2014-02-19 NOTE — Telephone Encounter (Signed)
You can add to my schedule on January 5 th even if it is a 15 minute slot.

## 2014-02-19 NOTE — Addendum Note (Signed)
Addended by: Laurine Blazer on: 02/19/2014 09:23 AM   Modules accepted: Orders

## 2014-02-19 NOTE — Telephone Encounter (Signed)
Order is in.

## 2014-02-26 ENCOUNTER — Encounter: Payer: Self-pay | Admitting: Cardiovascular Disease

## 2014-02-26 ENCOUNTER — Other Ambulatory Visit (HOSPITAL_COMMUNITY): Payer: Self-pay | Admitting: Cardiology

## 2014-02-26 ENCOUNTER — Ambulatory Visit (INDEPENDENT_AMBULATORY_CARE_PROVIDER_SITE_OTHER): Payer: Medicare Other | Admitting: Cardiovascular Disease

## 2014-02-26 VITALS — BP 162/72 | HR 68 | Ht 67.0 in

## 2014-02-26 DIAGNOSIS — E785 Hyperlipidemia, unspecified: Secondary | ICD-10-CM

## 2014-02-26 DIAGNOSIS — I739 Peripheral vascular disease, unspecified: Secondary | ICD-10-CM

## 2014-02-26 DIAGNOSIS — I10 Essential (primary) hypertension: Secondary | ICD-10-CM

## 2014-02-26 MED ORDER — CILOSTAZOL 100 MG PO TABS: 100.0000 mg | ORAL_TABLET | Freq: Two times a day (BID) | ORAL | Status: DC

## 2014-02-26 NOTE — Patient Instructions (Signed)
Your physician has requested that you have a lower extremity arterial duplex. This test is an ultrasound of the arteries in the legs. It looks at arterial blood flow in the legs. Allow one hour for Lower Arterial scans. There are no restrictions or special instructions  Your physician has recommended you make the following change in your medication:  1. START Pletal 100mg  take one-half tablet by mouth twice a day for 2 WEEKS and then increase to 100mg  by mouth twice a day  Your physician wants you to follow-up in: 3 MONTHS with Dr Fletcher Anon.  You will receive a reminder letter in the mail two months in advance. If you don't receive a letter, please call our office to schedule the follow-up appointment.

## 2014-02-26 NOTE — Progress Notes (Signed)
PCP: Primary cardiologist:  HPI  The patient is a 74 year old male who was referred for evaluation of claudication and PAD. He has known history of  paroxysmal supraventricular tachycardia, nonobstructive coronary artery disease, type 2 diabetes since 1990, hypertension and hyperlipidemia. He started having bilateral calf claudication which started about 1 year ago. His symptoms have been stable overall. However, he has gradually cut down his physical activities. He usually goes to the gym 4 times a week. He had to cut down his speed from 2.6 miles per hour to 2.2 mph. He kept the incline from 11 to 3. He is able to exercise for about 35 minutes. He has no history of smoking. He had an ABI done recently which was mildly reduced bilaterally at 0.78. It did decrease to 0.48 with exercise on the right and 0.58 on the left. He has no rest pain.   Allergies  Allergen Reactions  . Oxycontin [Oxycodone Hcl] Other (See Comments)    Malaise  . Prednisone Other (See Comments)    Reaction: muscle aches and soreness  . Simvastatin Other (See Comments)    Myalgias     Current Outpatient Prescriptions on File Prior to Visit  Medication Sig Dispense Refill  . aspirin EC 81 MG tablet Take 81 mg by mouth every morning.    [name] atorvastatin (LIPITOR) 10 MG tablet Take 5 mg by mouth every other day.     . diazepam (VALIUM) 10 MG tablet Take 10 mg by mouth at bedtime as needed.     . insulin glargine (LANTUS) 100 UNIT/ML injection Inject 30 Units into the skin at bedtime.     [name] lisinopril (PRINIVIL,ZESTRIL) 20 MG tablet Take 1 tablet (20 mg total) by mouth daily. 30 tablet 6  . metFORMIN (GLUCOPHAGE) 500 MG tablet Take 1,000 mg by mouth 2 (two) times daily with a meal. 2 tabs bid     . nitroGLYCERIN (NITROSTAT) 0.4 MG SL tablet Place 1 tablet (0.4 mg total) under the tongue every 5 (five) minutes x 3 doses as needed. 30 tablet 3  . propranolol (INDERAL) 10 MG tablet Take 10 mg by mouth as needed.      No  current facility-administered medications on file prior to visit.     Past Medical History  Diagnosis Date  . ASCVD (arteriosclerotic cardiovascular disease)     -luminal irregularities in 1998 and 2005; normal EF  . Diabetes mellitus   . Hypertension   . Hyperlipidemia   . Tobacco abuse     20 pack years; discontinued 1995  . Erectile dysfunction   . Benign prostatic hypertrophy     status post transurethral resection of the prostate  . 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 Dysphagia   . Seasonal allergies      Past Surgical History  Procedure Laterality Date  . Transurethral resection of prostate  2009  . Nasal septum surgery      Correction of deviated septum  . Appendectomy    . Colonoscopy w/ biopsies and polypectomy  07/08/08, 06/2011    Dr Madolyn Frieze papilla, diminutive rectal polyp ablated the, left-sided diverticula, piecemeal polypectomy of hepatic flexure adenomatous polyp, diminutive polyps near hepatic flexure ablated  . Colonoscopy  07/20/2011    Procedure: COLONOSCOPY;  Surgeon: Daneil Dolin, MD;  Location: [facility];  Service: Endoscopy;  Laterality: N/A;  8:30     Family History  Problem Relation Age of Onset  . Colon cancer Sister   . Ovarian cancer  Mother      History   Social History  . Marital Status: Married    Spouse Name: N/A    Number of Children: 2  . Years of Education: N/A   Occupational History  . retired-farmer, [place]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   Social History Main Topics  . Smoking status: Former Smoker -- 1.00 packs/day for 20 years    Types: Cigarettes    Quit date: 07/07/1993  . Smokeless tobacco: Never Used  . Alcohol Use: Yes     Comment: beer or mixed drink occasionally  . Drug Use: No  . Sexual Activity: Not on file   Other Topics Concern  . Not on file   Social History Narrative     ROS A 10 point review of system was performed. It is negative other than that mentioned in the  history of present illness.   PHYSICAL EXAM   BP 162/72 mmHg  Pulse 68  Ht 5\' 7"  (1.702 m) Constitutional: He is oriented to person, place, and time. He appears well-developed and well-nourished. No distress.  HENT: No nasal discharge.  Head: Normocephalic and atraumatic.  Eyes: Pupils are equal and round.  No discharge. Neck: Normal range of motion. Neck supple. No JVD present. No thyromegaly present.  Cardiovascular: Normal rate, regular rhythm, normal heart sounds. Exam reveals no gallop and no friction rub. No murmur heard.  Pulmonary/Chest: Effort normal and breath sounds normal. No stridor. No respiratory distress. He has no wheezes. He has no rales. He exhibits no tenderness.  Abdominal: Soft. Bowel sounds are normal. He exhibits no distension. There is no tenderness. There is no rebound and no guarding.  Musculoskeletal: Normal range of motion. He exhibits no edema and no tenderness.  Neurological: He is alert and oriented to person, place, and time. Coordination normal.  Skin: Skin is warm and dry. No rash noted. He is not diaphoretic. No erythema. No pallor.  Psychiatric: He has a normal mood and affect. His behavior is normal. Judgment and thought content normal.  Vascular: Radial pulses are normal bilaterally. Femoral pulses are normal bilaterally. Distal pulses are not palpable.     EKG: Normal sinus rhythm with sinus arrhythmia. Incomplete right bundle branch block   ASSESSMENT AND PLAN

## 2014-02-26 NOTE — Assessment & Plan Note (Signed)
Continue treatment with atorvastatin with a target LDL of less than 70. 

## 2014-02-26 NOTE — Assessment & Plan Note (Signed)
Blood pressure is elevated today. He reports that his blood pressure runs high at the physician's office. Continue to monitor.

## 2014-02-26 NOTE — Assessment & Plan Note (Signed)
The patient has moderate bilateral calf claudication (Rutherford  class II). This is likely due to SFA/popliteal disease. His femoral pulses are normal bilaterally. ABI was moderately reduced but did drop significantly with exercise. I discussed with him the natural history of claudication and management options. I recommend attempting medical therapy, a walking program and treatment with cilostazol as initial treatment. If symptoms do not improve, angiography and possible endovascular intervention can be considered.  I requested lower extremity arterial duplex.  Follow-up with me in 3 months.

## 2014-03-05 ENCOUNTER — Ambulatory Visit (HOSPITAL_COMMUNITY): Payer: Medicare Other | Attending: Cardiology | Admitting: *Deleted

## 2014-03-05 DIAGNOSIS — E785 Hyperlipidemia, unspecified: Secondary | ICD-10-CM | POA: Insufficient documentation

## 2014-03-05 DIAGNOSIS — I70212 Atherosclerosis of native arteries of extremities with intermittent claudication, left leg: Secondary | ICD-10-CM | POA: Insufficient documentation

## 2014-03-05 DIAGNOSIS — E119 Type 2 diabetes mellitus without complications: Secondary | ICD-10-CM | POA: Insufficient documentation

## 2014-03-05 DIAGNOSIS — I10 Essential (primary) hypertension: Secondary | ICD-10-CM | POA: Insufficient documentation

## 2014-03-05 DIAGNOSIS — I2510 Atherosclerotic heart disease of native coronary artery without angina pectoris: Secondary | ICD-10-CM | POA: Diagnosis not present

## 2014-03-05 DIAGNOSIS — I739 Peripheral vascular disease, unspecified: Secondary | ICD-10-CM | POA: Diagnosis present

## 2014-03-05 DIAGNOSIS — Z87891 Personal history of nicotine dependence: Secondary | ICD-10-CM | POA: Insufficient documentation

## 2014-03-05 NOTE — Progress Notes (Signed)
Lower extremity arterial Doppler and bilateral Duplex Performed

## 2014-03-19 ENCOUNTER — Ambulatory Visit (INDEPENDENT_AMBULATORY_CARE_PROVIDER_SITE_OTHER): Payer: Medicare Other | Admitting: Cardiovascular Disease

## 2014-03-19 ENCOUNTER — Encounter: Payer: Self-pay | Admitting: Cardiovascular Disease

## 2014-03-19 VITALS — BP 124/68 | HR 113 | Ht 67.5 in | Wt 177.0 lb

## 2014-03-19 DIAGNOSIS — I10 Essential (primary) hypertension: Secondary | ICD-10-CM

## 2014-03-19 DIAGNOSIS — E785 Hyperlipidemia, unspecified: Secondary | ICD-10-CM

## 2014-03-19 DIAGNOSIS — R6889 Other general symptoms and signs: Secondary | ICD-10-CM

## 2014-03-19 DIAGNOSIS — I739 Peripheral vascular disease, unspecified: Secondary | ICD-10-CM

## 2014-03-19 DIAGNOSIS — R002 Palpitations: Secondary | ICD-10-CM

## 2014-03-19 DIAGNOSIS — R000 Tachycardia, unspecified: Secondary | ICD-10-CM

## 2014-03-19 MED ORDER — METOPROLOL SUCCINATE ER 25 MG PO TB24: 25.0000 mg | ORAL_TABLET | Freq: Every day | ORAL | Status: DC

## 2014-03-19 NOTE — Progress Notes (Signed)
Patient ID: Marcus Norton, male   DOB: 02-27-1940, 74 y.o.   MRN: [id number]      SUBJECTIVE: The patient is here to followup for paroxysmal supraventricular tachycardia, nonobstructive coronary artery disease, peripheral vascular disease, hypertension and hyperlipidemia. He has been experiencing palpitations on a regular basis which has curtailed his exercise regimen. He denies chest pain, shortness of breath, leg swelling, orthopnea, and PND. He saw Dr. Fletcher Anon for PVD and was started on Pletal 100 mg bid but was not able to tolerate this dose, and reduced it to 50 mg bid. Lower extremity arterial ultrasonography demonstrated moderate occlusive disease. ECG performed in the office today demonstrates sinus tachycardia with a left anterior fascicular block, heart rate 104 bpm. RSR prime pattern is also noted in V1.  Review of Systems: As per "subjective", otherwise negative.  Allergies  Allergen Reactions  . Oxycontin [Oxycodone Hcl] Other (See Comments)    Malaise  . Prednisone Other (See Comments)    Reaction: muscle aches and soreness  . Simvastatin Other (See Comments)    Myalgias    Current Outpatient Prescriptions  Medication Sig Dispense Refill  . aspirin EC 81 MG tablet Take 81 mg by mouth every morning.    [name] atorvastatin (LIPITOR) 10 MG tablet Take 5 mg by mouth every other day.     . cilostazol (PLETAL) 100 MG tablet Take 1 tablet (100 mg total) by mouth 2 (two) times daily. 60 tablet 6  . diazepam (VALIUM) 10 MG tablet Take 10 mg by mouth at bedtime as needed.     . insulin glargine (LANTUS) 100 UNIT/ML injection Inject 30 Units into the skin at bedtime.     [name] lisinopril (PRINIVIL,ZESTRIL) 20 MG tablet Take 1 tablet (20 mg total) by mouth daily. 30 tablet 6  . metFORMIN (GLUCOPHAGE) 500 MG tablet Take 1,000 mg by mouth 2 (two) times daily with a meal. 2 tabs bid     . nitroGLYCERIN (NITROSTAT) 0.4 MG SL tablet Place 1 tablet (0.4 mg total) under the tongue every 5 (five)  minutes x 3 doses as needed. 30 tablet 3  . propranolol (INDERAL) 10 MG tablet Take 10 mg by mouth as needed.      No current facility-administered medications for this visit.    Past Medical History  Diagnosis Date  . ASCVD (arteriosclerotic cardiovascular disease)     -luminal irregularities in 1998 and 2005; normal EF  . Diabetes mellitus   . Hypertension   . Hyperlipidemia   . Tobacco abuse     20 pack years; discontinued 1995  . Erectile dysfunction   . Benign prostatic hypertrophy     status post transurethral resection of the prostate  . 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 Dysphagia   . Seasonal allergies     Past Surgical History  Procedure Laterality Date  . Transurethral resection of prostate  2009  . Nasal septum surgery      Correction of deviated septum  . Appendectomy    . Colonoscopy w/ biopsies and polypectomy  07/08/08, 06/2011    Dr Madolyn Frieze papilla, diminutive rectal polyp ablated the, left-sided diverticula, piecemeal polypectomy of hepatic flexure adenomatous polyp, diminutive polyps near hepatic flexure ablated  . Colonoscopy  07/20/2011    Procedure: COLONOSCOPY;  Surgeon: Daneil Dolin, MD;  Location: [facility];  Service: Endoscopy;  Laterality: N/A;  8:30    History   Social History  . Marital Status: Married    Spouse Name: N/A  Number of Children: 2  . Years of Education: N/A   Occupational History  . 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 Smoking status: Former Smoker -- 1.00 packs/day for 20 years    Types: Cigarettes    Start date: 03/08/1965    Quit date: 07/07/1993  . Smokeless tobacco: Never Used  . Alcohol Use: 0.0 oz/week    0 Not specified per week     Comment: beer or mixed drink occasionally  . Drug Use: No  . Sexual Activity: Not on file   Other Topics Concern  . Not on file   Social History Narrative     Filed Vitals:   03/19/14 0951  BP:  124/68  Pulse: 113  Height: 5' 7.5" (1.715 m)  Weight: 177 lb (80.287 kg)  SpO2: 96%    PHYSICAL EXAM General: NAD HEENT: Normal. Neck: No JVD, no thyromegaly. Lungs: Clear to auscultation bilaterally with normal respiratory effort. CV: Nondisplaced PMI.  Tachycardic, regular rhythm, normal S1/S2, no S3/S4, no murmur. No pretibial or periankle edema.   Abdomen: Soft, nontender, no hepatosplenomegaly, no distention.  Neurologic: Alert and oriented x 3.  Psych: Normal affect. Skin: Normal. Musculoskeletal: Normal range of motion, no gross deformities. Extremities: No clubbing or cyanosis.   ECG: Most recent ECG reviewed.      ASSESSMENT AND PLAN: 1. Palpitations/sinus tachycardia/PSVT: I will start Toprol-XL 25 mg daily. A previous event monitor showed symptoms which correlated with sinus rhythm, sinus tachycardia with PACs, and one episode of paroxysmal SVT.  2. Essential HTN: Controlled today. No changes to therapy. 3. Nonobstructive CAD: Prior nuclear myocardial perfusion study was normal. No further testing is indicated. Continue ASA and Lipitor. 4. Hyperlipidemia: On Lipitor 5 mg every other day, and managed by his PCP.  5. PVD: Walking encouraged. Continue Pletal, ASA, and Lipitor. Arterial ultrasound results noted above. Follow with Dr. Fletcher Anon.  Dispo: f/u 6 weeks.   Kate Sable, M.D., F.A.C.C.

## 2014-03-19 NOTE — Patient Instructions (Addendum)
Your physician recommends that you schedule a follow-up appointment in: 6 weeks     Your physician has recommended you make the following change in your medication:    START Toprol XL 25 mg daily   STOP Propranolol      Thank you for choosing [facility] !

## 2014-03-20 ENCOUNTER — Encounter: Payer: Self-pay | Admitting: Cardiovascular Disease

## 2014-03-20 NOTE — Telephone Encounter (Signed)
New problem   Pt returning call from nurse and stated he didn't know why.

## 2014-03-20 NOTE — Telephone Encounter (Signed)
This encounter was created in error - please disregard.

## 2014-04-22 ENCOUNTER — Emergency Department (HOSPITAL_COMMUNITY): Payer: Medicare Other

## 2014-04-22 ENCOUNTER — Emergency Department (HOSPITAL_COMMUNITY)
Admission: EM | Admit: 2014-04-22 | Discharge: 2014-04-22 | Disposition: A | Discharge status: Home / Self Care | Payer: Medicare Other | Attending: Emergency Medicine | Admitting: Emergency Medicine

## 2014-04-22 ENCOUNTER — Encounter (HOSPITAL_COMMUNITY): Payer: Self-pay | Admitting: *Deleted

## 2014-04-22 DIAGNOSIS — R079 Chest pain, unspecified: Secondary | ICD-10-CM | POA: Diagnosis not present

## 2014-04-22 DIAGNOSIS — Z7982 Long term (current) use of aspirin: Secondary | ICD-10-CM | POA: Insufficient documentation

## 2014-04-22 DIAGNOSIS — Z86010 Personal history of colonic polyps: Secondary | ICD-10-CM | POA: Diagnosis not present

## 2014-04-22 DIAGNOSIS — Z87891 Personal history of nicotine dependence: Secondary | ICD-10-CM | POA: Insufficient documentation

## 2014-04-22 DIAGNOSIS — E119 Type 2 diabetes mellitus without complications: Secondary | ICD-10-CM | POA: Diagnosis not present

## 2014-04-22 DIAGNOSIS — Z794 Long term (current) use of insulin: Secondary | ICD-10-CM | POA: Diagnosis not present

## 2014-04-22 DIAGNOSIS — Z79899 Other long term (current) drug therapy: Secondary | ICD-10-CM | POA: Diagnosis not present

## 2014-04-22 DIAGNOSIS — Z87430 Personal history of prostatic dysplasia: Secondary | ICD-10-CM | POA: Diagnosis not present

## 2014-04-22 DIAGNOSIS — R51 Headache: Secondary | ICD-10-CM | POA: Insufficient documentation

## 2014-04-22 DIAGNOSIS — R002 Palpitations: Secondary | ICD-10-CM | POA: Diagnosis present

## 2014-04-22 DIAGNOSIS — I10 Essential (primary) hypertension: Secondary | ICD-10-CM | POA: Diagnosis not present

## 2014-04-22 DIAGNOSIS — Z87438 Personal history of other diseases of male genital organs: Secondary | ICD-10-CM | POA: Insufficient documentation

## 2014-04-22 DIAGNOSIS — R42 Dizziness and giddiness: Secondary | ICD-10-CM | POA: Diagnosis not present

## 2014-04-22 DIAGNOSIS — E785 Hyperlipidemia, unspecified: Secondary | ICD-10-CM | POA: Diagnosis not present

## 2014-04-22 LAB — CBC WITH DIFFERENTIAL/PLATELET
BASOS ABS: 0 10*3/uL (ref 0.0–0.1)
Basophils Relative: 0 % (ref 0–1)
Eosinophils Absolute: 0.3 10*3/uL (ref 0.0–0.7)
Eosinophils Relative: 3 % (ref 0–5)
HEMATOCRIT: 41.5 % (ref 39.0–52.0)
Hemoglobin: 13.9 g/dL (ref 13.0–17.0)
Lymphocytes Relative: 26 % (ref 12–46)
Lymphs Abs: 2.7 10*3/uL (ref 0.7–4.0)
MCH: 32.7 pg (ref 26.0–34.0)
MCHC: 33.5 g/dL (ref 30.0–36.0)
MCV: 97.6 fL (ref 78.0–100.0)
Monocytes Absolute: 0.6 10*3/uL (ref 0.1–1.0)
Monocytes Relative: 6 % (ref 3–12)
NEUTROS ABS: 6.5 10*3/uL (ref 1.7–7.7)
NEUTROS PCT: 64 % (ref 43–77)
PLATELETS: 260 10*3/uL (ref 150–400)
RBC: 4.25 MIL/uL (ref 4.22–5.81)
RDW: 12.6 % (ref 11.5–15.5)
WBC: 10.1 10*3/uL (ref 4.0–10.5)

## 2014-04-22 LAB — BASIC METABOLIC PANEL
ANION GAP: 4 — AB (ref 5–15)
BUN: 35 mg/dL — ABNORMAL HIGH (ref 6–23)
CHLORIDE: 104 mmol/L (ref 96–112)
CO2: 28 mmol/L (ref 19–32)
CREATININE: 0.9 mg/dL (ref 0.50–1.35)
Calcium: 9.4 mg/dL (ref 8.4–10.5)
GFR, EST NON AFRICAN AMERICAN: 81 mL/min — AB (ref >90)
Glucose, Bld: 174 mg/dL — ABNORMAL HIGH (ref 70–99)
Potassium: 4.2 mmol/L (ref 3.5–5.1)
Sodium: 136 mmol/L (ref 135–145)

## 2014-04-22 LAB — TROPONIN I: Troponin I: <0.03 ng/mL (ref <0.031)

## 2014-04-22 NOTE — ED Notes (Signed)
Irregular heart beat for 1 month, denies chest pain.

## 2014-04-22 NOTE — ED Provider Notes (Signed)
CSN: [id number]     Arrival date & time 04/22/14  2001 History  This chart was scribed for Marcus Sorrow, MD by Dellis Filbert, ED Scribe. The patient was seen in [place] and the patient's care was started at 9:16 PM.  No chief complaint on file.  Patient is a 74 y.o. male presenting with palpitations. The history is provided by the patient. No language interpreter was used.  Palpitations Palpitations quality:  Irregular Onset quality:  Sudden Duration:  4 weeks Timing:  Intermittent Chronicity:  Recurrent Associated symptoms: chest pain   Associated symptoms: no back pain, no nausea, no shortness of breath and no vomiting     HPI Comments: Marcus Norton is a 74 y.o. male who presents to the Emergency Department complaining of an irregular heart beat ongoing for 1 month that has worsened the past two days, described as a thump. He also complains of left sided chest tightness, for the past week. His heart was monitored for one month last year in March which showed PACs and one episode of SVT. Pt was seen by cardiology on January 26th for heart palpations on a regular basis and some of his medications were adjusted to lower doses. He was also started on toprol on January 26th.  Past Medical History  Diagnosis Date  . ASCVD (arteriosclerotic cardiovascular disease)     -luminal irregularities in 1998 and 2005; normal EF  . Diabetes mellitus   . Hypertension   . Hyperlipidemia   . Tobacco abuse     20 pack years; discontinued 1995  . Erectile dysfunction   . Benign prostatic hypertrophy     status post transurethral resection of the prostate  . 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 Dysphagia   . Seasonal allergies    Past Surgical History  Procedure Laterality Date  . Transurethral resection of prostate  2009  . Nasal septum surgery      Correction of deviated septum  . Appendectomy    . Colonoscopy w/ biopsies and polypectomy  07/08/08, 06/2011    Dr Madolyn Frieze papilla,  diminutive rectal polyp ablated the, left-sided diverticula, piecemeal polypectomy of hepatic flexure adenomatous polyp, diminutive polyps near hepatic flexure ablated  . Colonoscopy  07/20/2011    Procedure: COLONOSCOPY;  Surgeon: Daneil Dolin, MD;  Location: [facility];  Service: Endoscopy;  Laterality: N/A;  8:30  . Tonsillectomy     Family History  Problem Relation Age of Onset  . Colon cancer Sister   . Ovarian cancer Mother    History  Substance Use Topics  . Smoking status: Former Smoker -- 1.00 packs/day for 20 years    Types: Cigarettes    Start date: 03/08/1965    Quit date: 07/07/1993  . Smokeless tobacco: Never Used  . Alcohol Use: 0.0 oz/week    0 Standard drinks or equivalent per week     Comment: beer or mixed drink occasionally    Review of Systems  Constitutional: Negative for fever and chills.  HENT: Negative for congestion, rhinorrhea and sore throat.   Eyes: Negative for visual disturbance.  Respiratory: Negative for shortness of breath.   Cardiovascular: Positive for chest pain and palpitations. Negative for leg swelling.  Gastrointestinal: Negative for nausea, vomiting, diarrhea and abdominal distention.  Genitourinary: Negative for dysuria.  Musculoskeletal: Negative for back pain.  Skin: Negative for rash.  Neurological: Positive for light-headedness and headaches.    Allergies  Oxycontin; Prednisone; and Simvastatin  Home Medications   Prior to  Admission medications   Medication Sig Start Date End Date Taking? Authorizing Provider  aspirin EC 81 MG tablet Take 81 mg by mouth every morning.    Historical Provider, MD  atorvastatin (LIPITOR) 10 MG tablet Take 5 mg by mouth every other day.  06/27/11   Historical Provider, MD  cilostazol (PLETAL) 100 MG tablet Take 1 tablet (100 mg total) by mouth 2 (two) times daily. Patient taking differently: Take 50 mg by mouth 2 (two) times daily.  02/26/14   Wellington Hampshire, MD  diazepam (VALIUM) 10 MG tablet  Take 10 mg by mouth at bedtime as needed.  12/08/11   Historical Provider, MD  insulin glargine (LANTUS) 100 UNIT/ML injection Inject 30 Units into the skin at bedtime.     Historical Provider, MD  lisinopril (PRINIVIL,ZESTRIL) 20 MG tablet Take 1 tablet (20 mg total) by mouth daily. 07/10/10   Yehuda Savannah, MD  metFORMIN (GLUCOPHAGE) 500 MG tablet Take 1,000 mg by mouth 2 (two) times daily with a meal. 2 tabs bid     Historical Provider, MD  metoprolol succinate (TOPROL XL) 25 MG 24 hr tablet Take 1 tablet (25 mg total) by mouth daily. 03/19/14   Herminio Commons, MD  nitroGLYCERIN (NITROSTAT) 0.4 MG SL tablet Place 1 tablet (0.4 mg total) under the tongue every 5 (five) minutes x 3 doses as needed. 01/12/12   Renella Cunas, MD   BP 162/66 mmHg  Pulse 99  Temp(Src) 98 F (36.7 C) (Oral)  Resp 16  Ht 5\' 7"  (1.702 m)  Wt 170 lb (77.111 kg)  BMI 26.62 kg/m2  SpO2 99% Physical Exam  Constitutional: He is oriented to person, place, and time. He appears well-developed and well-nourished.  HENT:  Mouth/Throat: Oropharynx is clear and moist and mucous membranes are normal.  Eyes: Conjunctivae and EOM are normal. Pupils are equal, round, and reactive to light. No scleral icterus.  Cardiovascular: Normal rate, regular rhythm and normal heart sounds.   No murmur heard. Pulmonary/Chest: Effort normal and breath sounds normal. No respiratory distress. He has no wheezes. He has no rales.  Lungs sound clear bilaterally.  Abdominal: Soft. Bowel sounds are normal. There is no tenderness.  Musculoskeletal: He exhibits no edema.  Neurological: He is alert and oriented to person, place, and time. He has normal reflexes. No cranial nerve deficit. He exhibits normal muscle tone. Coordination normal.  Skin: Skin is warm and dry. No rash noted.  Nursing note and vitals reviewed.   ED Course  Procedures  DIAGNOSTIC STUDIES: Oxygen Saturation is 99% on room air, normal by my interpretation.     COORDINATION OF CARE: 9:33 PM Discussed treatment plan with pt at bedside and pt agreed to plan.   Labs Review Labs Reviewed  BASIC METABOLIC PANEL  CBC WITH DIFFERENTIAL/PLATELET  TROPONIN I   Imaging Review Dg Chest 2 View  04/22/2014   CLINICAL DATA:  Irregular heart rate for about 1 month, no chest pain  EXAM: CHEST  2 VIEW  COMPARISON:  06/27/2013  FINDINGS: Cardiomediastinal silhouette is stable. No acute infiltrate or pleural effusion. No pulmonary edema. Mild degenerative changes thoracic spine.  IMPRESSION: No active cardiopulmonary disease.   Electronically Signed   By: Lahoma Crocker M.D.   On: 04/22/2014 21:04     EKG Interpretation   Date/Time:  Monday April 22 2014 20:14:48 EST Ventricular Rate:  86 PR Interval:  165 QRS Duration: 104 QT Interval:  394 QTC Calculation: 471 R Axis:   -  69 Text Interpretation:  Sinus rhythm Incomplete RBBB and LAFB Probable  anteroseptal infarct, old Baseline wander in lead(s) V1 V4 Confirmed by  Oley Lahaie  MD, Donyel Castagnola [phone]) on 04/22/2014 8:38:04 PM     No recent EKG for comparison. However read the cardiology's note in EKG sounds as if it is unchanged.    MDM   Final diagnoses:  Heart palpitations   Patient followed by cardiology. Patient had cardiac monitoring in the past that showed some PACs and one run of SVT. Patient was seen by cardiology in January of this year extensive note. Started on Toprol for his complaints which are very similar to what he has here today. He also modified 1 of his other medications by decreasing the dose. Patient here without any acute findings. No evidence of an acute MI. Chest x-rays negative. Labs without any significant abnormalities. Monitors shown no arrhythmia. Would recommend close follow-up with cardiology. Would recommend that the patient contact cardiology in the morning. They would be other decide if they want to adjust medicines further or whether they want to do additional Holter  monitoring.    I personally performed the services described in this documentation, which was scribed in my presence. The recorded information has been reviewed and is accurate.       Marcus Sorrow, MD 04/22/14 2232

## 2014-04-22 NOTE — Discharge Instructions (Signed)
Follow-up with cardiology. Give them a call explained to and your symptoms. Their note from January was very extensive I think they understand what's going on. Return for heart palpitations lasting 40 minutes or longer. Return for any worse chest pain.

## 2014-04-25 ENCOUNTER — Emergency Department (HOSPITAL_COMMUNITY)
Admission: EM | Admit: 2014-04-25 | Discharge: 2014-04-25 | Disposition: A | Discharge status: Home / Self Care | Payer: Medicare Other | Attending: Emergency Medicine | Admitting: Emergency Medicine

## 2014-04-25 ENCOUNTER — Emergency Department (HOSPITAL_COMMUNITY): Payer: Medicare Other

## 2014-04-25 ENCOUNTER — Encounter (HOSPITAL_COMMUNITY): Payer: Self-pay | Admitting: Emergency Medicine

## 2014-04-25 DIAGNOSIS — Z794 Long term (current) use of insulin: Secondary | ICD-10-CM | POA: Insufficient documentation

## 2014-04-25 DIAGNOSIS — R195 Other fecal abnormalities: Secondary | ICD-10-CM | POA: Diagnosis not present

## 2014-04-25 DIAGNOSIS — R0789 Other chest pain: Secondary | ICD-10-CM | POA: Insufficient documentation

## 2014-04-25 DIAGNOSIS — I10 Essential (primary) hypertension: Secondary | ICD-10-CM | POA: Diagnosis not present

## 2014-04-25 DIAGNOSIS — Z8709 Personal history of other diseases of the respiratory system: Secondary | ICD-10-CM | POA: Insufficient documentation

## 2014-04-25 DIAGNOSIS — E119 Type 2 diabetes mellitus without complications: Secondary | ICD-10-CM | POA: Insufficient documentation

## 2014-04-25 DIAGNOSIS — Z79899 Other long term (current) drug therapy: Secondary | ICD-10-CM | POA: Diagnosis not present

## 2014-04-25 DIAGNOSIS — R42 Dizziness and giddiness: Secondary | ICD-10-CM | POA: Insufficient documentation

## 2014-04-25 DIAGNOSIS — Z86018 Personal history of other benign neoplasm: Secondary | ICD-10-CM | POA: Insufficient documentation

## 2014-04-25 DIAGNOSIS — E785 Hyperlipidemia, unspecified: Secondary | ICD-10-CM | POA: Diagnosis not present

## 2014-04-25 DIAGNOSIS — Z7982 Long term (current) use of aspirin: Secondary | ICD-10-CM | POA: Insufficient documentation

## 2014-04-25 DIAGNOSIS — R002 Palpitations: Secondary | ICD-10-CM

## 2014-04-25 DIAGNOSIS — Z87438 Personal history of other diseases of male genital organs: Secondary | ICD-10-CM | POA: Insufficient documentation

## 2014-04-25 DIAGNOSIS — Z87891 Personal history of nicotine dependence: Secondary | ICD-10-CM | POA: Insufficient documentation

## 2014-04-25 LAB — CBC
HCT: 41.4 % (ref 39.0–52.0)
Hemoglobin: 14.2 g/dL (ref 13.0–17.0)
MCH: 33.4 pg (ref 26.0–34.0)
MCHC: 34.3 g/dL (ref 30.0–36.0)
MCV: 97.4 fL (ref 78.0–100.0)
Platelets: 256 10*3/uL (ref 150–400)
RBC: 4.25 MIL/uL (ref 4.22–5.81)
RDW: 12.7 % (ref 11.5–15.5)
WBC: 9.6 10*3/uL (ref 4.0–10.5)

## 2014-04-25 LAB — BASIC METABOLIC PANEL
Anion gap: 9 (ref 5–15)
BUN: 33 mg/dL — AB (ref 6–23)
CHLORIDE: 101 mmol/L (ref 96–112)
CO2: 27 mmol/L (ref 19–32)
Calcium: 9.5 mg/dL (ref 8.4–10.5)
Creatinine, Ser: 0.93 mg/dL (ref 0.50–1.35)
GFR calc Af Amer: >90 mL/min (ref >90)
GFR calc non Af Amer: 80 mL/min — ABNORMAL LOW (ref >90)
Glucose, Bld: 115 mg/dL — ABNORMAL HIGH (ref 70–99)
POTASSIUM: 4 mmol/L (ref 3.5–5.1)
Sodium: 137 mmol/L (ref 135–145)

## 2014-04-25 LAB — TROPONIN I

## 2014-04-25 MED ORDER — METOPROLOL TARTRATE 25 MG PO TABS: 12.5000 mg | ORAL_TABLET | ORAL | Status: DC | PRN

## 2014-04-25 NOTE — ED Provider Notes (Signed)
CSN: [id number]     Arrival date & time 04/25/14  2151 History  This chart was scribed for Marcus Acosta, MD by Delphia Grates, ED Scribe. This patient was seen in room [facility]05/[facility] and the patient's care was started at 10:02 PM.     Chief Complaint  Patient presents with  . Palpitations     The history is provided by the patient. No language interpreter was used.     HPI Comments: Marcus Norton is a 74 y.o. male, with history of DM, HTN, HLD, and ASCVD, who presents to the Emergency Department complaining of intermittent palpitions for the past month, that have gotten worse today. He reports prior episodes were brief, but notes this episode lasted for over 40 minutes. He denies palpations at this time, but notes mild chest tightness. Patient also notes associated light-headedness with this current episode. Patient denies history of MI or stent placement. He was seen here 3 days ago for the same where CXR and labs revealed unremarkable findings. Patient also mentions dark stools this morning and reports this is new. He denies abdominal pain or pain with eating. He denies chest pain, leg swelling, or diarrhea.   Patient was seen by cardiology on January 26th. Reported his event monitor over 30 days showed occasional ectopy and a single run of SVT, at which time they placed him on Metoprolol XL.   Past Medical History  Diagnosis Date  . ASCVD (arteriosclerotic cardiovascular disease)     -luminal irregularities in 1998 and 2005; normal EF  . Diabetes mellitus   . Hypertension   . Hyperlipidemia   . Tobacco abuse     20 pack years; discontinued 1995  . Erectile dysfunction   . Benign prostatic hypertrophy     status post transurethral resection of the prostate  . 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 Dysphagia   . Seasonal allergies    Past Surgical History  Procedure Laterality Date  . Transurethral resection of prostate  2009  . Nasal septum surgery      Correction of deviated  septum  . Appendectomy    . Colonoscopy w/ biopsies and polypectomy  07/08/08, 06/2011    Dr Madolyn Frieze papilla, diminutive rectal polyp ablated the, left-sided diverticula, piecemeal polypectomy of hepatic flexure adenomatous polyp, diminutive polyps near hepatic flexure ablated  . Colonoscopy  07/20/2011    Procedure: COLONOSCOPY;  Surgeon: Daneil Dolin, MD;  Location: [facility];  Service: Endoscopy;  Laterality: N/A;  8:30  . Tonsillectomy     Family History  Problem Relation Age of Onset  . Colon cancer Sister   . Ovarian cancer Mother    History  Substance Use Topics  . Smoking status: Former Smoker -- 1.00 packs/day for 20 years    Types: Cigarettes    Start date: 03/08/1965    Quit date: 07/07/1993  . Smokeless tobacco: Never Used  . Alcohol Use: 0.0 oz/week    0 Standard drinks or equivalent per week     Comment: beer or mixed drink occasionally    Review of Systems  Respiratory: Positive for chest tightness.   Cardiovascular: Positive for palpitations. Negative for chest pain and leg swelling.  Gastrointestinal: Positive for blood in stool. Negative for diarrhea.  Neurological: Positive for light-headedness.  All other systems reviewed and are negative.     Allergies  Oxycontin; Prednisone; and Simvastatin  Home Medications   Prior to Admission medications   Medication Sig Start Date End Date Taking? Authorizing  Provider  aspirin EC 81 MG tablet Take 81 mg by mouth every morning.   Yes Historical Provider, MD  atorvastatin (LIPITOR) 10 MG tablet Take 5 mg by mouth daily.  06/27/11  Yes Historical Provider, MD  cilostazol (PLETAL) 100 MG tablet Take 1 tablet (100 mg total) by mouth 2 (two) times daily. Patient taking differently: Take 50 mg by mouth 2 (two) times daily.  02/26/14  Yes Wellington Hampshire, MD  LANTUS SOLOSTAR 100 UNIT/ML Solostar Pen Inject 14 Units into the skin 2 (two) times daily. 04/22/14  Yes Historical Provider, MD  lisinopril  (PRINIVIL,ZESTRIL) 20 MG tablet Take 1 tablet (20 mg total) by mouth daily. 07/10/10  Yes Yehuda Savannah, MD  metFORMIN (GLUCOPHAGE) 500 MG tablet Take 1,000 mg by mouth 2 (two) times daily with a meal. 2 tabs bid    Yes Historical Provider, MD  metoprolol succinate (TOPROL XL) 25 MG 24 hr tablet Take 1 tablet (25 mg total) by mouth daily. 03/19/14  Yes Herminio Commons, MD  metoprolol (LOPRESSOR) 25 MG tablet Take 0.5 tablets (12.5 mg total) by mouth as needed (papitations lasting longer than 5 minutes). 04/25/14   Marcus Acosta, MD  nitroGLYCERIN (NITROSTAT) 0.4 MG SL tablet Place 1 tablet (0.4 mg total) under the tongue every 5 (five) minutes x 3 doses as needed. 01/12/12   Renella Cunas, MD   Triage Vitals: BP 147/60 mmHg  Pulse 95  Temp(Src) 97.5 F (36.4 C) (Oral)  Resp 16  Ht 5\' 7"  (1.702 m)  Wt 167 lb 8 oz (75.978 kg)  BMI 26.23 kg/m2  SpO2 97%  Physical Exam  Constitutional: He appears well-developed and well-nourished. No distress.  HENT:  Head: Normocephalic and atraumatic.  Mouth/Throat: Oropharynx is clear and moist. No oropharyngeal exudate.  Eyes: Conjunctivae and EOM are normal. Pupils are equal, round, and reactive to light. Right eye exhibits no discharge. Left eye exhibits no discharge. No scleral icterus.  Neck: Normal range of motion. Neck supple. No JVD present. No thyromegaly present.  Cardiovascular: Normal rate, regular rhythm, normal heart sounds and intact distal pulses.  Exam reveals no gallop and no friction rub.   No murmur heard. Strong pulses.  No JVD.  Heart rate between 90 and 100 without ectopy or obvious arrhythmia.   Pulmonary/Chest: Effort normal and breath sounds normal. No respiratory distress. He has no wheezes. He has no rales.  Abdominal: Soft. Bowel sounds are normal. He exhibits no distension and no mass. There is no tenderness.  Musculoskeletal: Normal range of motion. He exhibits no edema or tenderness.  Lymphadenopathy:    He has no  cervical adenopathy.  Neurological: He is alert. Coordination normal.  Skin: Skin is warm and dry. No rash noted. No erythema.  Psychiatric: He has a normal mood and affect. His behavior is normal.  Nursing note and vitals reviewed.   ED Course  Procedures (including critical care time)  DIAGNOSTIC STUDIES: Oxygen Saturation is 97% on room air, normal by my interpretation.    COORDINATION OF CARE: At 2210 Discussed treatment plan with patient which includes imaging and rectal exam. Patient agrees.   Labs Review Labs Reviewed  BASIC METABOLIC PANEL - Abnormal; Notable for the following:    Glucose, Bld 115 (*)    BUN 33 (*)    GFR calc non Af Amer 80 (*)    All other components within normal limits  CBC  TROPONIN I    Imaging Review Dg Chest Port 1  View  04/25/2014   CLINICAL DATA:  Cardiac palpitations without chest pain  EXAM: PORTABLE CHEST - 1 VIEW  COMPARISON:  04/22/2014  FINDINGS: The heart size and mediastinal contours are within normal limits. Both lungs are clear. The visualized skeletal structures are unremarkable.  IMPRESSION: No active disease.   Electronically Signed   By: Inez Catalina M.D.   On: 04/25/2014 22:37     EKG Interpretation   Date/Time:  Thursday April 25 2014 21:59:00 EST Ventricular Rate:  96 PR Interval:  147 QRS Duration: 103 QT Interval:  370 QTC Calculation: 468 R Axis:   -50 Text Interpretation:  Sinus rhythm Incomplete RBBB and LAFB Consider right  ventricular hypertrophy Baseline wander in lead(s) V3 since last tracing  no significant change Abnormal ekg Confirmed by Sabra Heck  MD, Carlito Bogert ([id number])  on 04/25/2014 10:01:52 PM      MDM   Final diagnoses:  Palpitations   nO palpitations while here - VS normal - d/w cardiologist Dr. Claiborne Billings - recommends Metoprolol pill in pocket - pt in agreement.  I personally performed the services described in this documentation, which was scribed in my presence. The recorded information has been reviewed  and is accurate.    I personally performed the services described in this documentation, which was scribed in my presence. The recorded information has been reviewed and is accurate.      Marcus Acosta, MD 04/25/14 [id number]

## 2014-04-25 NOTE — Discharge Instructions (Signed)
Make sure that you take the Toprol XL daily and use the Metoprolol (regular) when you have palpitations lasting longer than 5 minutes.

## 2014-04-25 NOTE — ED Notes (Signed)
Discharge instructions given, pt demonstrated teach back and verbal understanding. No concerns voiced.  

## 2014-04-25 NOTE — ED Notes (Signed)
Pt c/o heart palpitations lasting roughly an hour so far. Pt reports having them on Monday and was told to return to the ED if they came back. States they are worse this time. Denies chest pain. C/o SOB and intermittent back pain.

## 2014-05-07 ENCOUNTER — Ambulatory Visit (INDEPENDENT_AMBULATORY_CARE_PROVIDER_SITE_OTHER): Payer: Medicare Other | Admitting: Cardiovascular Disease

## 2014-05-07 ENCOUNTER — Encounter: Payer: Self-pay | Admitting: Cardiovascular Disease

## 2014-05-07 VITALS — BP 130/74 | HR 73 | Ht 67.0 in | Wt 175.6 lb

## 2014-05-07 DIAGNOSIS — I10 Essential (primary) hypertension: Secondary | ICD-10-CM

## 2014-05-07 DIAGNOSIS — I471 Supraventricular tachycardia: Secondary | ICD-10-CM

## 2014-05-07 DIAGNOSIS — R002 Palpitations: Secondary | ICD-10-CM

## 2014-05-07 DIAGNOSIS — I739 Peripheral vascular disease, unspecified: Secondary | ICD-10-CM

## 2014-05-07 DIAGNOSIS — I2510 Atherosclerotic heart disease of native coronary artery without angina pectoris: Secondary | ICD-10-CM

## 2014-05-07 DIAGNOSIS — E785 Hyperlipidemia, unspecified: Secondary | ICD-10-CM

## 2014-05-07 NOTE — Patient Instructions (Signed)
Your physician wants you to follow-up in: 1 year with Dr. Koneswaran.  You will receive a reminder letter in the mail two months in advance. If you don't receive a letter, please call our office to schedule the follow-up appointment.  Your physician recommends that you continue on your current medications as directed. Please refer to the Current Medication list given to you today.  Thank you for choosing [facility]!   

## 2014-05-07 NOTE — Progress Notes (Signed)
Patient ID: Marcus Norton, male   DOB: 1940-03-24, 74 y.o.   MRN: [id number]      SUBJECTIVE: The patient is here to followup for paroxysmal supraventricular tachycardia, nonobstructive coronary artery disease, peripheral vascular disease, hypertension and hyperlipidemia. He was recently evaluated in the ED for palpitations on 04/25/14. I personally reviewed all labs and studies as well as ECGs from this evaluation. CBC and troponin were normal. BUN/creatinine ratio indicated some element of dehydration. Chest x-ray showed no active disease. ECG demonstrated normal sinus rhythm with an incomplete right bundle-branch block and left anterior fascicular block, heart rate 96 bpm.  He follows with Dr. Fletcher Anon for PVD and had been taking Pletal 50 mg bid, but his PCP recently stopped it and the patient says he feels "much, much better". Lower extremity arterial ultrasonography demonstrated moderate occlusive disease.  He was also prescribed clonazepam as needed as the patient said he has felt more "edgy" lately. He quit drinking coffee approximately 2 weeks ago. He has not been drinking much water.   Review of Systems: As per "subjective", otherwise negative.  Allergies  Allergen Reactions  . Oxycontin [Oxycodone Hcl] Other (See Comments)    Malaise  . Prednisone Other (See Comments)    Reaction: muscle aches and soreness  . Simvastatin Other (See Comments)    Myalgias    Current Outpatient Prescriptions  Medication Sig Dispense Refill  . aspirin EC 81 MG tablet Take 81 mg by mouth every morning.    [name] atorvastatin (LIPITOR) 10 MG tablet Take 5 mg by mouth daily.     . cilostazol (PLETAL) 100 MG tablet Take 1 tablet (100 mg total) by mouth 2 (two) times daily. (Patient taking differently: Take 50 mg by mouth 2 (two) times daily. ) 60 tablet 6  . LANTUS SOLOSTAR 100 UNIT/ML Solostar Pen Inject 14 Units into the skin 2 (two) times daily.  11  . lisinopril (PRINIVIL,ZESTRIL) 20 MG tablet Take 1  tablet (20 mg total) by mouth daily. 30 tablet 6  . metFORMIN (GLUCOPHAGE) 500 MG tablet Take 1,000 mg by mouth 2 (two) times daily with a meal. 2 tabs bid     . metoprolol (LOPRESSOR) 25 MG tablet Take 0.5 tablets (12.5 mg total) by mouth as needed (papitations lasting longer than 5 minutes). 30 tablet 0  . metoprolol succinate (TOPROL XL) 25 MG 24 hr tablet Take 1 tablet (25 mg total) by mouth daily. 90 tablet 3  . nitroGLYCERIN (NITROSTAT) 0.4 MG SL tablet Place 1 tablet (0.4 mg total) under the tongue every 5 (five) minutes x 3 doses as needed. 30 tablet 3   No current facility-administered medications for this visit.    Past Medical History  Diagnosis Date  . ASCVD (arteriosclerotic cardiovascular disease)     -luminal irregularities in 1998 and 2005; normal EF  . Diabetes mellitus   . Hypertension   . Hyperlipidemia   . Tobacco abuse     20 pack years; discontinued 1995  . Erectile dysfunction   . Benign prostatic hypertrophy     status post transurethral resection of the prostate  . 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 Dysphagia   . Seasonal allergies     Past Surgical History  Procedure Laterality Date  . Transurethral resection of prostate  2009  . Nasal septum surgery      Correction of deviated septum  . Appendectomy    . Colonoscopy w/ biopsies and polypectomy  07/08/08, 06/2011    Dr Madolyn Frieze papilla,  diminutive rectal polyp ablated the, left-sided diverticula, piecemeal polypectomy of hepatic flexure adenomatous polyp, diminutive polyps near hepatic flexure ablated  . Colonoscopy  07/20/2011    Procedure: COLONOSCOPY;  Surgeon: Daneil Dolin, MD;  Location: [facility];  Service: Endoscopy;  Laterality: N/A;  8:30  . Tonsillectomy      History   Social History  . Marital Status: Married    Spouse Name: N/A  . Number of Children: 2  . Years of Education: N/A   Occupational History  . retired-farmer, 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   Social History Main Topics  . Smoking status: Former Smoker -- 1.00 packs/day for 20 years    Types: Cigarettes    Start date: 03/08/1965    Quit date: 07/07/1993  . Smokeless tobacco: Never Used  . Alcohol Use: 0.0 oz/week    0 Standard drinks or equivalent per week     Comment: beer or mixed drink occasionally  . Drug Use: No  . Sexual Activity: Not on file   Other Topics Concern  . Not on file   Social History Narrative    BP 130/74  Pulse 73  SpO2 97%  Weight 175 lb 9.6 oz (79.652 kg) Height 5\' 7"  (1.702 m)    PHYSICAL EXAM General: NAD HEENT: Normal. Neck: No JVD, no thyromegaly. Lungs: Clear to auscultation bilaterally with normal respiratory effort. CV: Nondisplaced PMI. Tachycardic, regular rhythm, normal S1/S2, no S3/S4, no murmur. No pretibial or periankle edema.  Abdomen: Soft, nontender, no hepatosplenomegaly, no distention.  Neurologic: Alert and oriented x 3.  Psych: Normal affect. Skin: Normal. Musculoskeletal: Normal range of motion, no gross deformities. Extremities: No clubbing or cyanosis.   ECG: Most recent ECG reviewed.      ASSESSMENT AND PLAN: 1. Palpitations/sinus tachycardia/PSVT: Symptomatically stable after discontinuation of Pletal by PCP. Encouraged increased intake of water given BUN/SCr ratio.  I will continue Toprol-XL 25 mg daily and he has been instructed to take an extra 12.5 mg prn, which he has not needed to do. A previous event monitor showed symptoms which correlated with sinus rhythm, sinus tachycardia with PACs, and one episode of paroxysmal SVT.  2. Essential HTN: Controlled today. No changes to therapy. 3. Nonobstructive CAD: Prior nuclear myocardial perfusion study was normal. No further testing is indicated. Continue ASA and Lipitor. 4. Hyperlipidemia: On Lipitor 5 mg every other day, and managed by his PCP.  5. PVD: Walking encouraged. Continue ASA and Lipitor. No longer on Pletal. Arterial  ultrasound results noted above. Follow with Dr. Fletcher Anon.  Dispo: f/u 1 year.   Kate Sable, M.D., F.A.C.C.

## 2014-06-11 ENCOUNTER — Ambulatory Visit (INDEPENDENT_AMBULATORY_CARE_PROVIDER_SITE_OTHER): Payer: Medicare Other | Admitting: Cardiovascular Disease

## 2014-06-11 ENCOUNTER — Encounter: Payer: Self-pay | Admitting: Cardiovascular Disease

## 2014-06-11 VITALS — BP 144/64 | HR 79 | Ht 67.0 in | Wt 174.0 lb

## 2014-06-11 DIAGNOSIS — I739 Peripheral vascular disease, unspecified: Secondary | ICD-10-CM

## 2014-06-11 DIAGNOSIS — I10 Essential (primary) hypertension: Secondary | ICD-10-CM

## 2014-06-11 DIAGNOSIS — E785 Hyperlipidemia, unspecified: Secondary | ICD-10-CM

## 2014-06-11 NOTE — Patient Instructions (Signed)
Medication Instructions:  Your physician recommends that you continue on your current medications as directed. Please refer to the Current Medication list given to you today.  Labwork: No new orders.   Testing/Procedures: No new orders.   Follow-Up: Your physician wants you to follow-up in: 1 YEAR with Dr Arida.  You will receive a reminder letter in the mail two months in advance. If you don't receive a letter, please call our office to schedule the follow-up appointment.   Any Other Special Instructions Will Be Listed Below (If Applicable).   

## 2014-06-11 NOTE — Progress Notes (Signed)
HPI  The patient is a 74 year old male who is here today for follow-up visit regarding  claudication and PAD. He has known history of  paroxysmal supraventricular tachycardia, nonobstructive coronary artery disease, type 2 diabetes since 1990, hypertension and hyperlipidemia. He started having bilateral calf claudication which started about 1 year ago. His symptoms have been stable overall. However, he has gradually cut down his physical activities. He usually goes to the gym 4 times a week. He had to cut down his speed from 2.6 miles per hour to 2.2 mph. He kept the incline from 11 to 3. He is able to exercise for about 35 minutes. He has no history of smoking. He had an ABI last year which was mildly reduced bilaterally at 0.78. It did decrease to 0.48 with exercise on the right and 0.58 on the left. He has no rest pain. He did not tolerate cilostazol due to palpitations.   Allergies  Allergen Reactions  . Oxycontin [Oxycodone Hcl] Other (See Comments)    Malaise  . Prednisone Other (See Comments)    Reaction: muscle aches and soreness  . Simvastatin Other (See Comments)    Myalgias     Current Outpatient Prescriptions on File Prior to Visit  Medication Sig Dispense Refill  . aspirin EC 81 MG tablet Take 81 mg by mouth every morning.    [name] atorvastatin (LIPITOR) 10 MG tablet Take 5 mg by mouth daily.     . clonazePAM (KLONOPIN) 0.5 MG tablet   0  . LANTUS SOLOSTAR 100 UNIT/ML Solostar Pen Inject 14 Units into the skin 2 (two) times daily.  11  . lisinopril (PRINIVIL,ZESTRIL) 20 MG tablet Take 1 tablet (20 mg total) by mouth daily. 30 tablet 6  . metFORMIN (GLUCOPHAGE) 500 MG tablet Take 1,000 mg by mouth 2 (two) times daily with a meal. 2 tabs bid     . nitroGLYCERIN (NITROSTAT) 0.4 MG SL tablet Place 1 tablet (0.4 mg total) under the tongue every 5 (five) minutes x 3 doses as needed. 30 tablet 3   No current facility-administered medications on file prior to visit.     Past  Medical History  Diagnosis Date  . ASCVD (arteriosclerotic cardiovascular disease)     -luminal irregularities in 1998 and 2005; normal EF  . Diabetes mellitus   . Hypertension   . Hyperlipidemia   . Tobacco abuse     20 pack years; discontinued 1995  . Erectile dysfunction   . Benign prostatic hypertrophy     status post transurethral resection of the prostate  . 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 Dysphagia   . Seasonal allergies      Past Surgical History  Procedure Laterality Date  . Transurethral resection of prostate  2009  . Nasal septum surgery      Correction of deviated septum  . Appendectomy    . Colonoscopy w/ biopsies and polypectomy  07/08/08, 06/2011    Dr Madolyn Frieze papilla, diminutive rectal polyp ablated the, left-sided diverticula, piecemeal polypectomy of hepatic flexure adenomatous polyp, diminutive polyps near hepatic flexure ablated  . Colonoscopy  07/20/2011    Procedure: COLONOSCOPY;  Surgeon: Daneil Dolin, MD;  Location: [facility];  Service: Endoscopy;  Laterality: N/A;  8:30  . Tonsillectomy       Family History  Problem Relation Age of Onset  . Colon cancer Sister   . Ovarian cancer Mother      History   Social History  .  Marital Status: Married    Spouse Name: N/A  . Number of Children: 2  . Years of Education: N/A   Occupational History  . retired-farmer, 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     [employer]   Social History Main Topics  . Smoking status: Former Smoker -- 1.00 packs/day for 20 years    Types: Cigarettes    Start date: 03/08/1965    Quit date: 07/07/1993  . Smokeless tobacco: Never Used  . Alcohol Use: 0.0 oz/week    0 Standard drinks or equivalent per week     Comment: beer or mixed drink occasionally  . Drug Use: No  . Sexual Activity: Not on file   Other Topics Concern  . Not on file   Social History Narrative     ROS A 10 point review of system was performed. It is negative other than that  mentioned in the history of present illness.   PHYSICAL EXAM   BP 144/64 mmHg  Pulse 79  Ht 5\' 7"  (1.702 m)  Wt 174 lb (78.926 kg)  BMI 27.25 kg/m2 Constitutional: He is oriented to person, place, and time. He appears well-developed and well-nourished. No distress.  HENT: No nasal discharge.  Head: Normocephalic and atraumatic.  Eyes: Pupils are equal and round.  No discharge. Neck: Normal range of motion. Neck supple. No JVD present. No thyromegaly present.  Cardiovascular: Normal rate, regular rhythm, normal heart sounds. Exam reveals no gallop and no friction rub. No murmur heard.  Pulmonary/Chest: Effort normal and breath sounds normal. No stridor. No respiratory distress. He has no wheezes. He has no rales. He exhibits no tenderness.  Abdominal: Soft. Bowel sounds are normal. He exhibits no distension. There is no tenderness. There is no rebound and no guarding.  Musculoskeletal: Normal range of motion. He exhibits no edema and no tenderness.  Neurological: He is alert and oriented to person, place, and time. Coordination normal.  Skin: Skin is warm and dry. No rash noted. He is not diaphoretic. No erythema. No pallor.  Psychiatric: He has a normal mood and affect. His behavior is normal. Judgment and thought content normal.  Vascular: Radial pulses are normal bilaterally. Femoral pulses are normal bilaterally. Distal pulses are not palpable.      ASSESSMENT AND PLAN

## 2014-06-14 NOTE — Assessment & Plan Note (Signed)
Blood pressure is controlled on current medications. 

## 2014-06-14 NOTE — Assessment & Plan Note (Signed)
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 This is likely due to SFA/popliteal disease. His femoral pulses are normal bilaterally. ABI was moderately reduced but did drop significantly with exercise. He did not tolerate cilostazol. Current claudication is not lifestyle limiting and thus recommend continuing medical therapy and treatment of risk factors. He is to continue his exercise program and follow-up with me on a yearly basis or earlier if needed.

## 2014-06-14 NOTE — Assessment & Plan Note (Signed)
Continue treatment with atorvastatin with a target LDL of less than 70. 

## 2015-05-19 ENCOUNTER — Ambulatory Visit (INDEPENDENT_AMBULATORY_CARE_PROVIDER_SITE_OTHER): Payer: Medicare Other | Admitting: Cardiovascular Disease

## 2015-05-19 ENCOUNTER — Encounter: Payer: Self-pay | Admitting: Cardiovascular Disease

## 2015-05-19 VITALS — BP 138/74 | HR 115 | Ht 67.0 in | Wt 176.0 lb

## 2015-05-19 DIAGNOSIS — R002 Palpitations: Secondary | ICD-10-CM | POA: Diagnosis not present

## 2015-05-19 DIAGNOSIS — I471 Supraventricular tachycardia, unspecified: Secondary | ICD-10-CM

## 2015-05-19 DIAGNOSIS — I10 Essential (primary) hypertension: Secondary | ICD-10-CM

## 2015-05-19 DIAGNOSIS — R000 Tachycardia, unspecified: Secondary | ICD-10-CM

## 2015-05-19 DIAGNOSIS — E785 Hyperlipidemia, unspecified: Secondary | ICD-10-CM | POA: Diagnosis not present

## 2015-05-19 DIAGNOSIS — I739 Peripheral vascular disease, unspecified: Secondary | ICD-10-CM

## 2015-05-19 NOTE — Progress Notes (Signed)
Patient ID: NATHANUAL RASK, male   DOB: 10-Jun-1940, 75 y.o.   MRN: [id number]      SUBJECTIVE: The patient is here to followup for paroxysmal supraventricular tachycardia, nonobstructive coronary artery disease, peripheral vascular disease, hypertension and hyperlipidemia. ECG today shows sinus rhythm with a left anterior fascicular block, heart rate 99 bpm. He is no longer taking metoprolol and said he didn't tolerate it  He denies exertional chest pain. He is just getting over a bad bout of asthmatic bronchitis and has not regained his full energy levels yet. He sometimes has claudication pain.    Review of Systems: As per "subjective", otherwise negative.  Allergies  Allergen Reactions  . Oxycontin [Oxycodone Hcl] Other (See Comments)    Malaise  . Prednisone Other (See Comments)    Reaction: muscle aches and soreness  . Simvastatin Other (See Comments)    Myalgias    Current Outpatient Prescriptions  Medication Sig Dispense Refill  . aspirin EC 81 MG tablet Take 81 mg by mouth every morning.    [name] atorvastatin (LIPITOR) 10 MG tablet Take 5 mg by mouth daily.     [name] LANTUS SOLOSTAR 100 UNIT/ML Solostar Pen Inject 14 Units into the skin 2 (two) times daily.  11  . lisinopril (PRINIVIL,ZESTRIL) 20 MG tablet Take 1 tablet (20 mg total) by mouth daily. 30 tablet 6  . metFORMIN (GLUCOPHAGE) 500 MG tablet Take 1,000 mg by mouth 2 (two) times daily with a meal. 2 tabs bid     . nitroGLYCERIN (NITROSTAT) 0.4 MG SL tablet Place 1 tablet (0.4 mg total) under the tongue every 5 (five) minutes x 3 doses as needed. 30 tablet 3  . clonazePAM (KLONOPIN) 0.5 MG tablet Reported on 05/19/2015  0   No current facility-administered medications for this visit.    Past Medical History  Diagnosis Date  . ASCVD (arteriosclerotic cardiovascular disease)     -luminal irregularities in 1998 and 2005; normal EF  . Diabetes mellitus   . Hypertension   . Hyperlipidemia   . Tobacco abuse     20 pack  years; discontinued 1995  . Erectile dysfunction   . Benign prostatic hypertrophy     status post transurethral resection of the prostate  . 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 Dysphagia   . Seasonal allergies     Past Surgical History  Procedure Laterality Date  . Transurethral resection of prostate  2009  . Nasal septum surgery      Correction of deviated septum  . Appendectomy    . Colonoscopy w/ biopsies and polypectomy  07/08/08, 06/2011    Dr Madolyn Frieze papilla, diminutive rectal polyp ablated the, left-sided diverticula, piecemeal polypectomy of hepatic flexure adenomatous polyp, diminutive polyps near hepatic flexure ablated  . Colonoscopy  07/20/2011    Procedure: COLONOSCOPY;  Surgeon: Daneil Dolin, MD;  Location: [facility];  Service: Endoscopy;  Laterality: N/A;  8:30  . Tonsillectomy      Social History   Social History  . Marital Status: Married    Spouse Name: N/A  . Number of Children: 2  . Years of Education: N/A   Occupational History  . 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 Smoking status: Former Smoker -- 1.00 packs/day for 20 years    Types: Cigarettes    Start date: 03/08/1965    Quit date: 07/07/1993  . Smokeless tobacco: Never Used  . Alcohol  Use: 0.0 oz/week    0 Standard drinks or equivalent per week     Comment: beer or mixed drink occasionally  . Drug Use: No  . Sexual Activity: Not on file   Other Topics Concern  . Not on file   Social History Narrative     Filed Vitals:   05/19/15 0822  BP: 138/74  Pulse: 115  Height: 5\' 7"  (1.702 m)  Weight: 176 lb (79.833 kg)  SpO2: 97%    PHYSICAL EXAM General: NAD HEENT: Normal. Neck: No JVD, no thyromegaly. Lungs: Clear to auscultation bilaterally with normal respiratory effort. CV: Nondisplaced PMI.  Regular rate and rhythm, normal S1/S2, no S3/S4, no murmur. No pretibial or periankle edema.  No carotid bruit.     Abdomen: Soft, nontender, no distention.  Neurologic: Alert and oriented.  Psych: Normal affect. Skin: Normal. Musculoskeletal: No gross deformities.  ECG: Most recent ECG reviewed.      ASSESSMENT AND PLAN: 1. Palpitations/sinus tachycardia/PSVT: Did not tolerate metoprolol. A previous event monitor showed symptoms which correlated with sinus rhythm, sinus tachycardia with PACs, and one episode of paroxysmal SVT. No changes to management.  2. Essential HTN: Controlled today. No changes to therapy.  3. Nonobstructive CAD: Prior nuclear myocardial perfusion study was normal. No further testing is indicated. Continue ASA and Lipitor.  4. Hyperlipidemia: On Lipitor 5 mg every other day, and managed by his PCP.   5. PVD: Walking encouraged. Continue ASA and Lipitor. No longer on Pletal. Follow with Dr. Fletcher Anon.  Dispo: f/u 1 year.   Kate Sable, M.D., F.A.C.C.

## 2015-05-19 NOTE — Patient Instructions (Signed)
Your physician wants you to follow-up in:  1 year with Dr Koneswaran You will receive a reminder letter in the mail two months in advance. If you don't receive a letter, please call our office to schedule the follow-up appointment.    Your physician recommends that you continue on your current medications as directed. Please refer to the Current Medication list given to you today.    If you need a refill on your cardiac medications before your next appointment, please call your pharmacy.     Thank you for choosing [facility] !        

## 2015-06-09 ENCOUNTER — Encounter: Payer: Self-pay | Admitting: Gastroenterology

## 2015-06-09 ENCOUNTER — Ambulatory Visit (INDEPENDENT_AMBULATORY_CARE_PROVIDER_SITE_OTHER): Payer: Medicare Other | Admitting: Gastroenterology

## 2015-06-09 VITALS — BP 150/76 | HR 89 | Temp 96.7°F | Ht 67.0 in | Wt 178.6 lb

## 2015-06-09 DIAGNOSIS — R110 Nausea: Secondary | ICD-10-CM

## 2015-06-09 NOTE — Progress Notes (Signed)
cc'ed to pcp °

## 2015-06-09 NOTE — Progress Notes (Signed)
Primary Care Physician:  Robert Bellow, MD Primary Gastroenterologist:  Dr. Gala Romney  Chief Complaint  Patient presents with  . Nausea    since before Christmas     HPI:   Marcus Norton is a 75 y.o. male presenting today as a self-referral secondary to recent prolonged bout with nausea. Last EGD in 2013 with erosive esophagitis and empiric dilation. Colonoscopy for polyp surveillance due in 2018.   States around Christmas, would wake up with stomach hurting and nauseated. Had bronchitis around this time. Had a relapse of the bronchitis. Was having some drainage. His cousin, a retired Marine scientist, picked up Zyrtec about 3 weeks ago. States this helped with the nausea. Abdominal pain resolved. Rare reflux, doesn't have to take anything. Sometimes has problems with big pills. No solid food dysphagia. No unexplained weight loss or lack of appetite. No melena or hematochezia. Sometimes stool can be hard. No supplemental fiber. Tries to drink a lot of water. At time of appt, his symptoms have been quiescent for about 3 weeks.   Past Medical History  Diagnosis Date  . ASCVD (arteriosclerotic cardiovascular disease)     -luminal irregularities in 1998 and 2005; normal EF  . Diabetes mellitus   . Hypertension   . Hyperlipidemia   . Tobacco abuse     20 pack years; discontinued 1995  . Erectile dysfunction   . Benign prostatic hypertrophy     status post transurethral resection of the prostate  . 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 Dysphagia   . Seasonal allergies     Past Surgical History  Procedure Laterality Date  . Transurethral resection of prostate  2009  . Nasal septum surgery      Correction of deviated septum  . Appendectomy    . Colonoscopy w/ biopsies and polypectomy  07/08/08, 06/2011    Dr Madolyn Frieze papilla, diminutive rectal polyp ablated the, left-sided diverticula, piecemeal polypectomy of hepatic flexure adenomatous polyp, diminutive polyps near hepatic flexure  ablated  . Colonoscopy  07/20/2011    Dr. Gala Romney: multiple hyperplastic polyps. Surveillance 2018  . Tonsillectomy    . Esophagogastroduodenoscopy  2013    erosive reflux esophagitis, s/p empiric dilation. chronic duodenitis.     Current Outpatient Prescriptions  Medication Sig Dispense Refill  . aspirin EC 81 MG tablet Take 81 mg by mouth every morning.    [name] atorvastatin (LIPITOR) 10 MG tablet Take 5 mg by mouth daily.     . cetirizine (ZYRTEC) 10 MG tablet Take 10 mg by mouth daily.    . clonazePAM (KLONOPIN) 0.5 MG tablet Reported on 05/19/2015  0  . LANTUS SOLOSTAR 100 UNIT/ML Solostar Pen Inject 14 Units into the skin 2 (two) times daily.  11  . lisinopril (PRINIVIL,ZESTRIL) 20 MG tablet Take 1 tablet (20 mg total) by mouth daily. 30 tablet 6  . metFORMIN (GLUCOPHAGE) 500 MG tablet Take 1,000 mg by mouth 2 (two) times daily with a meal. 2 tabs bid     . nitroGLYCERIN (NITROSTAT) 0.4 MG SL tablet Place 1 tablet (0.4 mg total) under the tongue every 5 (five) minutes x 3 doses as needed. 30 tablet 3   No current facility-administered medications for this visit.    Allergies as of 06/09/2015 - Review Complete 06/09/2015  Allergen Reaction Noted  . Oxycontin [oxycodone hcl] Other (See Comments) 05/14/2012  . Prednisone Other (See Comments) 07/14/2011  . Simvastatin Other (See Comments) 05/14/2012    Family History  Problem Relation Age  of Onset  . Colon cancer Sister 21    deceased from colon cancer  . Ovarian cancer Mother     Social History   Social History  . Marital Status: Married    Spouse Name: N/A  . Number of Children: 2  . Years of Education: N/A   Occupational History  . 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 Smoking status: Former Smoker -- 1.00 packs/day for 20 years    Types: Cigarettes    Start date: 03/08/1965    Quit date: 07/07/1993  . Smokeless tobacco: Never Used  . Alcohol Use:  0.0 oz/week    0 Standard drinks or equivalent per week     Comment: beer or mixed drink occasionally  . Drug Use: No  . Sexual Activity: Not on file   Other Topics Concern  . Not on file   Social History Narrative    Review of Systems: Gen: Denies any fever, chills, fatigue, weight loss, lack of appetite.  CV: Denies chest pain, heart palpitations, peripheral edema, syncope.  Resp: Denies shortness of breath at rest or with exertion. Denies wheezing or cough.  GI: see HPI  GU : Denies urinary burning, urinary frequency, urinary hesitancy MS: joint pain  Derm: Denies rash, itching, dry skin Psych: Denies depression, anxiety, memory loss, and confusion Heme: Denies bruising, bleeding, and enlarged lymph nodes.  Physical Exam: BP 150/76 mmHg  Pulse 89  Temp(Src) 96.7 F (35.9 C) (Oral)  Ht 5\' 7"  (1.702 m)  Wt 178 lb 9.6 oz (81.012 kg)  BMI 27.97 kg/m2 General:   Alert and oriented. Pleasant and cooperative. Well-nourished and well-developed.  Head:  Normocephalic and atraumatic. Eyes:  Without icterus, sclera clear and conjunctiva pink.  Ears:  Normal auditory acuity. Nose:  No deformity, discharge,  or lesions. Mouth:  No deformity or lesions, oral mucosa pink.  Lungs:  Clear to auscultation bilaterally. No wheezes, rales, or rhonchi. No distress.  Heart:  S1, S2 present without murmurs appreciated.  Abdomen:  +BS, soft, non-tender and non-distended. No HSM noted. No guarding or rebound. No masses appreciated.  Rectal:  Deferred  Msk:  Symmetrical without gross deformities. Normal posture. Extremities:  Without  edema. Neurologic:  Alert and  oriented x4;  grossly normal neurologically. Psych:  Alert and cooperative. Normal mood and affect.

## 2015-06-09 NOTE — Patient Instructions (Signed)
Please let me know if you have any more problems at all.   Please complete the stool kit and return to our office. If it is positive, we will proceed with a colonoscopy.  Otherwise, we will see you in 2018 to schedule a colonoscopy!

## 2015-06-09 NOTE — Assessment & Plan Note (Signed)
75 year old male with recent bouts of nausea and vague abdominal pain, improving after starting Zyrtec. Unclear etiology of vague lower abdominal discomfort; I suspect nausea may be more related to post-nasal drip scenario. He has no concerning lower or upper GI symptoms currently. He is due for a colonoscopy in 2018, with a personal history of polyps and family history of colon cancer in his sister, succumbing to the disease in her mid 40s. We will obtain an ifobt now. If he has ANY further recurrence of abdominal pain or nausea, we will pursue a more aggressive work-up. However, he has had complete cessation of symptoms for the last 3 weeks and is doing well. If ifobt positive, will pursue early interval colonoscopy. Risks and benefits discussed in detail with patient at time of appointment, who stated understanding in case this needs to be pursued. Otherwise, we will pursue this in 2018.

## 2015-06-12 ENCOUNTER — Ambulatory Visit (INDEPENDENT_AMBULATORY_CARE_PROVIDER_SITE_OTHER): Payer: Medicare Other

## 2015-06-12 DIAGNOSIS — Z1211 Encounter for screening for malignant neoplasm of colon: Secondary | ICD-10-CM

## 2015-06-13 LAB — IFOBT (OCCULT BLOOD): IFOBT: NEGATIVE

## 2015-06-13 NOTE — Progress Notes (Signed)
Pt returned ifobt and it was negative.  

## 2015-06-17 NOTE — Progress Notes (Signed)
Quick Note:  ifobt negative. Proceed with colonoscopy as planned in 2018. ______

## 2015-09-24 ENCOUNTER — Other Ambulatory Visit (HOSPITAL_COMMUNITY)
Admission: RE | Admit: 2015-09-24 | Discharge: 2015-09-24 | Disposition: A | Discharge status: Home / Self Care | Payer: Medicare Other | Source: Ambulatory Visit | Attending: Family Medicine | Admitting: Family Medicine

## 2015-09-24 DIAGNOSIS — E119 Type 2 diabetes mellitus without complications: Secondary | ICD-10-CM | POA: Insufficient documentation

## 2015-09-24 DIAGNOSIS — R5383 Other fatigue: Secondary | ICD-10-CM | POA: Insufficient documentation

## 2015-09-24 DIAGNOSIS — R002 Palpitations: Secondary | ICD-10-CM | POA: Diagnosis present

## 2015-09-24 LAB — CBC WITH DIFFERENTIAL/PLATELET
Basophils Absolute: 0.1 10*3/uL (ref 0.0–0.1)
Basophils Relative: 1 %
Eosinophils Absolute: 0.3 10*3/uL (ref 0.0–0.7)
Eosinophils Relative: 3 %
HCT: 43.8 % (ref 39.0–52.0)
HEMOGLOBIN: 14.9 g/dL (ref 13.0–17.0)
LYMPHS ABS: 2.5 10*3/uL (ref 0.7–4.0)
LYMPHS PCT: 25 %
MCH: 33 pg (ref 26.0–34.0)
MCHC: 34 g/dL (ref 30.0–36.0)
MCV: 97.1 fL (ref 78.0–100.0)
MONO ABS: 0.6 10*3/uL (ref 0.1–1.0)
Monocytes Relative: 6 %
Neutro Abs: 6.7 10*3/uL (ref 1.7–7.7)
Neutrophils Relative %: 65 %
Platelets: 250 10*3/uL (ref 150–400)
RBC: 4.51 MIL/uL (ref 4.22–5.81)
RDW: 12.9 % (ref 11.5–15.5)
WBC: 10.2 10*3/uL (ref 4.0–10.5)

## 2015-09-24 LAB — BASIC METABOLIC PANEL
Anion gap: 8 (ref 5–15)
BUN: 28 mg/dL — ABNORMAL HIGH (ref 6–20)
CHLORIDE: 97 mmol/L — AB (ref 101–111)
CO2: 28 mmol/L (ref 22–32)
Calcium: 8.9 mg/dL (ref 8.9–10.3)
Creatinine, Ser: 0.98 mg/dL (ref 0.61–1.24)
GFR calc Af Amer: >60 mL/min (ref >60)
GFR calc non Af Amer: >60 mL/min (ref >60)
GLUCOSE: 103 mg/dL — AB (ref 65–99)
Potassium: 5 mmol/L (ref 3.5–5.1)
Sodium: 133 mmol/L — ABNORMAL LOW (ref 135–145)

## 2015-10-06 ENCOUNTER — Ambulatory Visit: Payer: Medicare Other | Admitting: Cardiovascular Disease

## 2015-10-07 ENCOUNTER — Ambulatory Visit (INDEPENDENT_AMBULATORY_CARE_PROVIDER_SITE_OTHER): Payer: Medicare Other | Admitting: Urology

## 2015-10-07 DIAGNOSIS — N401 Enlarged prostate with lower urinary tract symptoms: Secondary | ICD-10-CM | POA: Diagnosis not present

## 2015-10-07 DIAGNOSIS — N5201 Erectile dysfunction due to arterial insufficiency: Secondary | ICD-10-CM

## 2015-10-14 ENCOUNTER — Ambulatory Visit: Payer: Medicare Other | Admitting: Adult Health

## 2015-10-21 ENCOUNTER — Ambulatory Visit (INDEPENDENT_AMBULATORY_CARE_PROVIDER_SITE_OTHER): Payer: Medicare Other | Admitting: Cardiology

## 2015-10-21 ENCOUNTER — Encounter: Payer: Self-pay | Admitting: Cardiology

## 2015-10-21 ENCOUNTER — Ambulatory Visit: Payer: Medicare Other | Admitting: Cardiology

## 2015-10-21 VITALS — BP 128/64 | HR 79 | Ht 67.0 in | Wt 174.0 lb

## 2015-10-21 DIAGNOSIS — I739 Peripheral vascular disease, unspecified: Secondary | ICD-10-CM

## 2015-10-21 DIAGNOSIS — I10 Essential (primary) hypertension: Secondary | ICD-10-CM

## 2015-10-21 DIAGNOSIS — R002 Palpitations: Secondary | ICD-10-CM | POA: Diagnosis not present

## 2015-10-21 DIAGNOSIS — E785 Hyperlipidemia, unspecified: Secondary | ICD-10-CM

## 2015-10-21 DIAGNOSIS — I2510 Atherosclerotic heart disease of native coronary artery without angina pectoris: Secondary | ICD-10-CM

## 2015-10-21 DIAGNOSIS — E1122 Type 2 diabetes mellitus with diabetic chronic kidney disease: Secondary | ICD-10-CM

## 2015-10-21 NOTE — Assessment & Plan Note (Signed)
Last LEA dopplers 03/06/14- plan was for medical Rx (which the couldn't tolerate)

## 2015-10-21 NOTE — Assessment & Plan Note (Signed)
On statin Rx 

## 2015-10-21 NOTE — Assessment & Plan Note (Signed)
Now on insulin

## 2015-10-21 NOTE — Assessment & Plan Note (Signed)
Pt c/o of lifestyle limiting bilateral calf claudication

## 2015-10-21 NOTE — Assessment & Plan Note (Signed)
Controlled.  

## 2015-10-21 NOTE — Assessment & Plan Note (Signed)
-  luminal irregularities in 1998 and 2005. Myoview low risk 2015

## 2015-10-21 NOTE — Assessment & Plan Note (Signed)
Chronic, intolerant to beta blocker

## 2015-10-21 NOTE — Progress Notes (Signed)
10/21/2015 Marcus Norton [id number]   [birth year]  [id number]  Primary Physician Robert Bellow, MD Primary Cardiologist: Dr Bronson Ing / Dr Audelia Acton  HPI:  75 y/o followed by Dr Bronson Ing and seen previously by Dr Audelia Acton. He has PVD, last dopplers Jan 2016 showed mildly reduced ABI bilaterally at 0.78. It did decrease to 0.48 with exercise on the right and 0.58 on the left. Pletal was added but the pt says he could not tolerate this. He is in the offcie today after he complained of bilateral calf pain with walking to his PCP. The pt says he had been going to the [facility] 4 times a week till the past month or so. He stopped secondary to exertional calf discomfort. He denies rest pain. He thinks the Rt may be somewhat worse than the Lt.    Current Outpatient Prescriptions  Medication Sig Dispense Refill  . aspirin EC 81 MG tablet Take 81 mg by mouth every morning.    [name] atorvastatin (LIPITOR) 10 MG tablet Take 5 mg by mouth daily.     [name] LANTUS SOLOSTAR 100 UNIT/ML Solostar Pen Inject 14 Units into the skin 2 (two) times daily.  11  . lisinopril (PRINIVIL,ZESTRIL) 20 MG tablet Take 1 tablet (20 mg total) by mouth daily. 30 tablet 6  . metFORMIN (GLUCOPHAGE) 500 MG tablet Take 1,000 mg by mouth 2 (two) times daily with a meal. 2 tabs bid     . nitroGLYCERIN (NITROSTAT) 0.4 MG SL tablet Place 1 tablet (0.4 mg total) under the tongue every 5 (five) minutes x 3 doses as needed. 30 tablet 3   No current facility-administered medications for this visit.     Allergies  Allergen Reactions  . Oxycontin [Oxycodone Hcl] Other (See Comments)    Malaise  . Prednisone Other (See Comments)    Reaction: muscle aches and soreness  . Simvastatin Other (See Comments)    Myalgias    Social History   Social History  . Marital status: Married    Spouse name: N/A  . Number of children: 2  . Years of education: N/A   Occupational History  . retired-farmer,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     [employer]   Social History Main Topics  . Smoking status: Former Smoker    Packs/day: 1.00    Years: 20.00    Types: Cigarettes    Start date: 03/08/1965    Quit date: 07/07/1993  . Smokeless tobacco: Never Used  . Alcohol use 0.0 oz/week     Comment: beer or mixed drink occasionally  . Drug use: No  . Sexual activity: Yes   Other Topics Concern  . Not on file   Social History Narrative  . No narrative on file     Review of Systems: General: negative for chills, fever, night sweats or weight changes.  Cardiovascular: negative for chest pain, dyspnea on exertion, edema, orthopnea, paroxysmal nocturnal dyspnea or shortness of breath Dermatological: negative for rash Respiratory: negative for cough or wheezing Urologic: negative for hematuria Abdominal: negative for nausea, vomiting, diarrhea, bright red blood per rectum, melena, or hematemesis Neurologic: negative for visual changes, syncope, or dizziness All other systems reviewed and are otherwise negative except as noted above.    Blood pressure 128/64, pulse 79, height 5\' 7"  (1.702 m), weight 174 lb (78.9 kg), SpO2 96 %.  General appearance: alert, cooperative and no distress Neck: no carotid bruit and no JVD Lungs: clear to auscultation bilaterally Heart: regular rate  and rhythm Abdomen: soft, non-tender; bowel sounds normal; no masses,  no organomegaly Extremities: no edema, elevation palor, dependent rubor Pulses: both LE pulses warm to touch. No hair growth seen on either foot. Pulses not palpable on either foot.  Skin: Skin color, texture, turgor normal. No rashes or lesions Neurologic: Grossly normal   ASSESSMENT AND PLAN:   Claudication of calf muscles (HCC) Pt c/o of lifestyle limiting bilateral calf claudication  PAD (peripheral artery disease) Last LEA dopplers 03/06/14- plan was for medical Rx (which the couldn't tolerate)  Palpitations Chronic, intolerant to beta blocker  Arteriosclerotic  cardiovascular disease -nonobstructive -luminal irregularities in 1998 and 2005. Myoview low risk 2015  Hypertension Controlled  Diabetes mellitus, type 2 Now on insulin  Hyperlipidemia On statin Rx   PLAN  Check LEA dopplers and f/u with Dr Audelia Acton.   Marcus Ransom PA-C 10/21/2015 11:44 AM

## 2015-10-21 NOTE — Patient Instructions (Signed)
Medication Instructions:  Your physician recommends that you continue on your current medications as directed. Please refer to the Current Medication list given to you today.   Labwork: none  Testing/Procedures: Your physician has requested that you have an ankle brachial index (ABI). During this test an ultrasound and blood pressure cuff are used to evaluate the arteries that supply the arms and legs with blood. Allow thirty minutes for this exam. There are no restrictions or special instructions.  * lower extremity doppler*   Follow-Up: Your physician recommends that you schedule a follow-up appointment in: to be determined with Dr. Fletcher Anon in [place] after testing is done   Any Other Special Instructions Will Be Listed Below (If Applicable).     If you need a refill on your cardiac medications before your next appointment, please call your pharmacy.

## 2015-11-04 ENCOUNTER — Other Ambulatory Visit: Payer: Self-pay | Admitting: Cardiology

## 2015-11-04 DIAGNOSIS — I739 Peripheral vascular disease, unspecified: Secondary | ICD-10-CM

## 2015-11-13 ENCOUNTER — Ambulatory Visit (HOSPITAL_COMMUNITY)
Admission: RE | Admit: 2015-11-13 | Discharge: 2015-11-13 | Disposition: A | Discharge status: Home / Self Care | Payer: Medicare Other | Source: Ambulatory Visit | Attending: Cardiology | Admitting: Cardiology

## 2015-11-13 DIAGNOSIS — I998 Other disorder of circulatory system: Secondary | ICD-10-CM | POA: Diagnosis not present

## 2015-11-13 DIAGNOSIS — I739 Peripheral vascular disease, unspecified: Secondary | ICD-10-CM | POA: Diagnosis not present

## 2015-11-14 ENCOUNTER — Telehealth: Payer: Self-pay | Admitting: *Deleted

## 2015-11-14 NOTE — Telephone Encounter (Signed)
Left message for pt to call to schedule follow up appt.

## 2015-11-14 NOTE — Telephone Encounter (Signed)
-----   Message from Isaiah Serge, NP sent at 11/14/2015  8:52 AM EDT ----- Marcus Hampshire, MD  Isaiah Serge, NP; Cristopher Estimable, RN    It seems that he needs an angiogram.  Please add him to my schedule on October 3rd ([facility] office) to discuss.  Thanks.   [name] Preferred Name:  None Male, 75 y.o., Apr 05, 1940  [name] can you arrange this, and let pt know--I was covering for [facility] .   Thank you.

## 2015-11-17 NOTE — Telephone Encounter (Signed)
Follow up scheduled

## 2015-11-18 ENCOUNTER — Ambulatory Visit (INDEPENDENT_AMBULATORY_CARE_PROVIDER_SITE_OTHER): Payer: Medicare Other | Admitting: Cardiovascular Disease

## 2015-11-18 ENCOUNTER — Encounter: Payer: Self-pay | Admitting: Cardiovascular Disease

## 2015-11-18 VITALS — BP 144/62 | HR 78 | Ht 67.0 in | Wt 175.6 lb

## 2015-11-18 DIAGNOSIS — I10 Essential (primary) hypertension: Secondary | ICD-10-CM

## 2015-11-18 DIAGNOSIS — I739 Peripheral vascular disease, unspecified: Secondary | ICD-10-CM

## 2015-11-18 DIAGNOSIS — E785 Hyperlipidemia, unspecified: Secondary | ICD-10-CM

## 2015-11-18 DIAGNOSIS — R011 Cardiac murmur, unspecified: Secondary | ICD-10-CM | POA: Diagnosis not present

## 2015-11-18 LAB — CBC
HCT: 44.6 % (ref 38.5–50.0)
HEMOGLOBIN: 14.6 g/dL (ref 13.2–17.1)
MCH: 32.6 pg (ref 27.0–33.0)
MCHC: 32.7 g/dL (ref 32.0–36.0)
MCV: 99.6 fL (ref 80.0–100.0)
MPV: 9.9 fL (ref 7.5–12.5)
Platelets: 278 10*3/uL (ref 140–400)
RBC: 4.48 MIL/uL (ref 4.20–5.80)
RDW: 13.4 % (ref 11.0–15.0)
WBC: 8.8 10*3/uL (ref 3.8–10.8)

## 2015-11-18 LAB — BASIC METABOLIC PANEL
BUN: 22 mg/dL (ref 7–25)
CHLORIDE: 101 mmol/L (ref 98–110)
CO2: 25 mmol/L (ref 20–31)
Calcium: 9.5 mg/dL (ref 8.6–10.3)
Creat: 0.91 mg/dL (ref 0.70–1.18)
Glucose, Bld: 196 mg/dL — ABNORMAL HIGH (ref 65–99)
Potassium: 5.1 mmol/L (ref 3.5–5.3)
SODIUM: 139 mmol/L (ref 135–146)

## 2015-11-18 NOTE — Patient Instructions (Addendum)
Medication Instructions:  Your physician recommends that you continue on your current medications as directed. Please refer to the Current Medication list given to you today.  Labwork: Your physician recommends that you have lab work today: BMP, CBC and PT/INR  Testing/Procedures: Your physician has requested that you have an echocardiogram. Echocardiography is a painless test that uses sound waves to create images of your heart. It provides your doctor with information about the size and shape of your heart and how well your heart's chambers and valves are working. This procedure takes approximately one hour. There are no restrictions for this procedure.  Your physician has requested that you have a peripheral vascular angiogram. This exam is performed at the hospital. During this exam IV contrast is used to look at arterial blood flow. Please review the information sheet given for details.  Follow-Up: Your physician recommends that you schedule a follow-up appointment in: 3-4 WEEKS with Dr Fletcher Anon   Any Other Special Instructions Will Be Listed Below (If Applicable).     If you need a refill on your cardiac medications before your next appointment, please call your pharmacy.

## 2015-11-18 NOTE — Progress Notes (Signed)
Cardiology Office Note   Date:  11/18/2015   ID:  Gautham, Roosevelt 08-29-1940, MRN [id number]  PCP:  Robert Bellow, MD  Cardiologist: [name]  Chief Complaint  Patient presents with  . Follow-up    dopplers/PVD      History of Present Illness: Aleczander TEX HEIMER is a 75 y.o. male who presents for a follow-up visit regarding  claudication and PAD. He has known history of  paroxysmal supraventricular tachycardia, nonobstructive coronary artery disease, type 2 diabetes since 1990, hypertension and hyperlipidemia. He has known history of claudication due to suspected SFA disease. He did not tolerate cilostazol due to palpitations. He was treated medically. However, he now reports worsening claudication especially on the right side. He used to go to the gym 4 times a week. He currently goes at most one time a week and is limited by calf claudication. He denies any rest pain or lower extremity ulceration. He had recent ABI which was 0.71 on the right and 0.83 on the left.    Past Medical History:  Diagnosis Date  . 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 ASCVD (arteriosclerotic cardiovascular disease)    -luminal irregularities in 1998 and 2005; normal EF  . Benign prostatic hypertrophy    status post transurethral resection of the prostate  . Diabetes mellitus   . Dysphagia   . Erectile dysfunction   . Hyperlipidemia   . Hypertension   . Seasonal allergies   . Tobacco abuse    20 pack years; discontinued 1995    Past Surgical History:  Procedure Laterality Date  . APPENDECTOMY    . COLONOSCOPY  07/20/2011   Dr. Gala Romney: multiple hyperplastic polyps. Surveillance 2018  . COLONOSCOPY W/ BIOPSIES AND POLYPECTOMY  07/08/08, 06/2011   Dr Madolyn Frieze papilla, diminutive rectal polyp ablated the, left-sided diverticula, piecemeal polypectomy of hepatic flexure adenomatous polyp, diminutive polyps near hepatic flexure ablated  . ESOPHAGOGASTRODUODENOSCOPY  2013   erosive  reflux esophagitis, s/p empiric dilation. chronic duodenitis.   [name] NASAL SEPTUM SURGERY     Correction of deviated septum  . TONSILLECTOMY    . TRANSURETHRAL RESECTION OF PROSTATE  2009     Current Outpatient Prescriptions  Medication Sig Dispense Refill  . aspirin EC 81 MG tablet Take 81 mg by mouth every morning.    [name] atorvastatin (LIPITOR) 10 MG tablet Take 5 mg by mouth daily.     [name] LANTUS SOLOSTAR 100 UNIT/ML Solostar Pen Inject 14 Units into the skin 2 (two) times daily.  11  . lisinopril (PRINIVIL,ZESTRIL) 20 MG tablet Take 1 tablet (20 mg total) by mouth daily. 30 tablet 6  . metFORMIN (GLUCOPHAGE) 500 MG tablet Take 1,000 mg by mouth 2 (two) times daily with a meal. 2 tabs bid     . nitroGLYCERIN (NITROSTAT) 0.4 MG SL tablet Place 1 tablet (0.4 mg total) under the tongue every 5 (five) minutes x 3 doses as needed. 30 tablet 3  . omeprazole (PRILOSEC) 20 MG capsule Take 20 mg by mouth daily.     No current facility-administered medications for this visit.     Allergies:   Oxycontin [oxycodone hcl]; Penicillins; Pioglitazone; Prednisone; and Simvastatin    Social History:  The patient  reports that he quit smoking about 22 years ago. His smoking use included Cigarettes. He started smoking about 50 years ago. He has a 20.00 pack-year smoking history. He has never used smokeless tobacco. He reports that he drinks alcohol. He reports that  he does not use drugs.   Family History:  The patient's family history includes Colon cancer (age of onset: 37) in his sister; Ovarian cancer in his mother.    ROS:  Please see the history of present illness.   Otherwise, review of systems are positive for none.   All other systems are reviewed and negative.    PHYSICAL EXAM: VS:  BP (!) 144/62   Pulse 78   Ht 5\' 7"  (1.702 m)   Wt 175 lb 9.6 oz (79.7 kg)   BMI 27.50 kg/m  , BMI Body mass index is 27.5 kg/m. GEN: Well nourished, well developed, in no acute distress  HEENT: normal  Neck:  no JVD, carotid bruits, or masses Cardiac: RRR; no  rubs, or gallops,no edema . 2/6 crescendo decrescendo systolic murmur in the aortic area which is mid peaking Respiratory:  clear to auscultation bilaterally, normal work of breathing GI: soft, nontender, nondistended, + BS MS: no deformity or atrophy  Skin: warm and dry, no rash Neuro:  Strength and sensation are intact Psych: euthymic mood, full affect Vascular: Femoral pulse: Normal bilaterally. Dorsalis pedis is palpable on the left side and not the right side. Posterior tibial is not palpable.  EKG:  EKG is not ordered today.   Recent Labs: 09/24/2015: BUN 28; Creatinine, Ser 0.98; Hemoglobin 14.9; Platelets 250; Potassium 5.0; Sodium 133    Lipid Panel    Component Value Date/Time   CHOL 156 03/12/2008   TRIG 96 03/12/2008   HDL 46 03/12/2008   LDLCALC 91 03/12/2008      Wt Readings from Last 3 Encounters:  11/18/15 175 lb 9.6 oz (79.7 kg)  10/21/15 174 lb (78.9 kg)  06/09/15 178 lb 9.6 oz (81 kg)       No flowsheet data found.    ASSESSMENT AND PLAN:  1.  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 The patient has cut down on his physical activity significantly due to the symptoms and used to be more active. He did not tolerate cilostazol in the past. Due to progression of his symptoms, I recommend proceeding with abdominal aortogram, lower extremity runoff and possible endovascular intervention. I discussed the procedure in details as well as risk and benefits.  2. Cardiac murmur: Suggestive of aortic stenosis on I requested an echocardiogram.  3. Hyperlipidemia: Continue treatment with atorvastatin with a target LDL of less than 70.  4. Essential hypertension: Controlled on lisinopril.    Disposition:   FU with me in 1 month  Signed,  [name], MD  11/18/2015 10:14 AM    [place]

## 2015-11-19 LAB — PROTIME-INR
INR: 1
PROTHROMBIN TIME: 10.5 s (ref 9.0–11.5)

## 2015-11-26 ENCOUNTER — Encounter (HOSPITAL_COMMUNITY)
Admission: RE | Disposition: A | Discharge status: Home / Self Care | Payer: Self-pay | Source: Ambulatory Visit | Attending: Cardiovascular Disease

## 2015-11-26 ENCOUNTER — Ambulatory Visit (HOSPITAL_COMMUNITY)
Admission: RE | Admit: 2015-11-26 | Discharge: 2015-11-26 | Disposition: A | Discharge status: Home / Self Care | Payer: Medicare Other | Source: Ambulatory Visit | Attending: Cardiovascular Disease | Admitting: Cardiovascular Disease

## 2015-11-26 DIAGNOSIS — E1151 Type 2 diabetes mellitus with diabetic peripheral angiopathy without gangrene: Secondary | ICD-10-CM | POA: Diagnosis not present

## 2015-11-26 DIAGNOSIS — N529 Male erectile dysfunction, unspecified: Secondary | ICD-10-CM | POA: Insufficient documentation

## 2015-11-26 DIAGNOSIS — Z8041 Family history of malignant neoplasm of ovary: Secondary | ICD-10-CM | POA: Diagnosis not present

## 2015-11-26 DIAGNOSIS — Z800 Family history of malignant neoplasm of digestive organs: Secondary | ICD-10-CM | POA: Insufficient documentation

## 2015-11-26 DIAGNOSIS — I2510 Atherosclerotic heart disease of native coronary artery without angina pectoris: Secondary | ICD-10-CM | POA: Insufficient documentation

## 2015-11-26 DIAGNOSIS — I10 Essential (primary) hypertension: Secondary | ICD-10-CM | POA: Diagnosis not present

## 2015-11-26 DIAGNOSIS — Z7982 Long term (current) use of aspirin: Secondary | ICD-10-CM | POA: Insufficient documentation

## 2015-11-26 DIAGNOSIS — I471 Supraventricular tachycardia: Secondary | ICD-10-CM | POA: Diagnosis not present

## 2015-11-26 DIAGNOSIS — Z880 Allergy status to penicillin: Secondary | ICD-10-CM | POA: Insufficient documentation

## 2015-11-26 DIAGNOSIS — R011 Cardiac murmur, unspecified: Secondary | ICD-10-CM | POA: Diagnosis not present

## 2015-11-26 DIAGNOSIS — I70213 Atherosclerosis of native arteries of extremities with intermittent claudication, bilateral legs: Secondary | ICD-10-CM | POA: Insufficient documentation

## 2015-11-26 DIAGNOSIS — Z86010 Personal history of colonic polyps: Secondary | ICD-10-CM | POA: Diagnosis not present

## 2015-11-26 DIAGNOSIS — E785 Hyperlipidemia, unspecified: Secondary | ICD-10-CM | POA: Insufficient documentation

## 2015-11-26 DIAGNOSIS — N400 Enlarged prostate without lower urinary tract symptoms: Secondary | ICD-10-CM | POA: Diagnosis not present

## 2015-11-26 DIAGNOSIS — Z87891 Personal history of nicotine dependence: Secondary | ICD-10-CM | POA: Diagnosis not present

## 2015-11-26 DIAGNOSIS — Z794 Long term (current) use of insulin: Secondary | ICD-10-CM | POA: Insufficient documentation

## 2015-11-26 DIAGNOSIS — I739 Peripheral vascular disease, unspecified: Secondary | ICD-10-CM

## 2015-11-26 HISTORY — PX: PERIPHERAL VASCULAR CATHETERIZATION: SHX172C

## 2015-11-26 LAB — POCT ACTIVATED CLOTTING TIME
ACTIVATED CLOTTING TIME: 191 s
ACTIVATED CLOTTING TIME: 202 s
Activated Clotting Time: 164 seconds

## 2015-11-26 LAB — GLUCOSE, CAPILLARY
GLUCOSE-CAPILLARY: 240 mg/dL — AB (ref 65–99)
Glucose-Capillary: 272 mg/dL — ABNORMAL HIGH (ref 65–99)

## 2015-11-26 SURGERY — ABDOMINAL AORTOGRAM W/LOWER EXTREMITY

## 2015-11-26 MED ORDER — ASPIRIN 81 MG PO CHEW
CHEWABLE_TABLET | ORAL | Status: AC
  Administered 2015-11-26: 81 mg via ORAL
  Filled 2015-11-26: qty 1

## 2015-11-26 MED ORDER — MIDAZOLAM HCL 2 MG/2ML IJ SOLN
INTRAMUSCULAR | Status: DC | PRN
  Administered 2015-11-26: 1 mg via INTRAVENOUS

## 2015-11-26 MED ORDER — SODIUM CHLORIDE 0.9 % WEIGHT BASED INFUSION
3.0000 mL/kg/h | INTRAVENOUS | Status: DC
Start: 2015-11-27 — End: 2015-11-26
  Administered 2015-11-26: 3 mL/kg/h via INTRAVENOUS

## 2015-11-26 MED ORDER — SODIUM CHLORIDE 0.9% FLUSH: 3.0000 mL | Freq: Two times a day (BID) | INTRAVENOUS | Status: DC

## 2015-11-26 MED ORDER — NITROGLYCERIN 1 MG/10 ML FOR IR/CATH LAB
INTRA_ARTERIAL | Status: DC | PRN
  Administered 2015-11-26: 200 ug via INTRA_ARTERIAL

## 2015-11-26 MED ORDER — SODIUM CHLORIDE 0.9% FLUSH: 3.0000 mL | INTRAVENOUS | Status: DC | PRN

## 2015-11-26 MED ORDER — HEPARIN (PORCINE) IN NACL 2-0.9 UNIT/ML-% IJ SOLN
INTRAMUSCULAR | Status: DC | PRN
  Administered 2015-11-26: 1000 mL via INTRA_ARTERIAL

## 2015-11-26 MED ORDER — LABETALOL HCL 5 MG/ML IV SOLN
INTRAVENOUS | Status: AC
  Filled 2015-11-26: qty 4

## 2015-11-26 MED ORDER — MIDAZOLAM HCL 2 MG/2ML IJ SOLN
INTRAMUSCULAR | Status: AC
  Filled 2015-11-26: qty 2

## 2015-11-26 MED ORDER — NITROGLYCERIN 1 MG/10 ML FOR IR/CATH LAB
INTRA_ARTERIAL | Status: AC
  Filled 2015-11-26: qty 10

## 2015-11-26 MED ORDER — LIDOCAINE HCL (PF) 1 % IJ SOLN
INTRAMUSCULAR | Status: AC
  Filled 2015-11-26: qty 30

## 2015-11-26 MED ORDER — ASPIRIN 81 MG PO CHEW
81.0000 mg | CHEWABLE_TABLET | ORAL | Status: AC
Start: 2015-11-26 — End: 2015-11-26
  Administered 2015-11-26: 81 mg via ORAL

## 2015-11-26 MED ORDER — FENTANYL CITRATE (PF) 100 MCG/2ML IJ SOLN
INTRAMUSCULAR | Status: DC | PRN
  Administered 2015-11-26: 50 ug via INTRAVENOUS
  Administered 2015-11-26: 25 ug via INTRAVENOUS

## 2015-11-26 MED ORDER — SODIUM CHLORIDE 0.9 % IV SOLN
250.0000 mL | INTRAVENOUS | Status: DC | PRN
Start: 2015-11-26 — End: 2015-11-26

## 2015-11-26 MED ORDER — LABETALOL HCL 5 MG/ML IV SOLN
20.0000 mg | INTRAVENOUS | Status: DC | PRN
  Administered 2015-11-26: 20 mg via INTRAVENOUS

## 2015-11-26 MED ORDER — FENTANYL CITRATE (PF) 100 MCG/2ML IJ SOLN
INTRAMUSCULAR | Status: AC
  Filled 2015-11-26: qty 2

## 2015-11-26 MED ORDER — CLOPIDOGREL BISULFATE 300 MG PO TABS
ORAL_TABLET | ORAL | Status: DC | PRN
  Administered 2015-11-26: 300 mg via ORAL

## 2015-11-26 MED ORDER — SODIUM CHLORIDE 0.9 % IV SOLN: 250.0000 mL | INTRAVENOUS | Status: DC | PRN

## 2015-11-26 MED ORDER — IODIXANOL 320 MG/ML IV SOLN
INTRAVENOUS | Status: DC | PRN
  Administered 2015-11-26: 167 mL via INTRA_ARTERIAL

## 2015-11-26 MED ORDER — SODIUM CHLORIDE 0.9 % WEIGHT BASED INFUSION
1.0000 mL/kg/h | INTRAVENOUS | Status: DC
Start: 2015-11-27 — End: 2015-11-26

## 2015-11-26 MED ORDER — HEPARIN SODIUM (PORCINE) 1000 UNIT/ML IJ SOLN
INTRAMUSCULAR | Status: DC | PRN
  Administered 2015-11-26: 6000 [IU] via INTRAVENOUS
  Administered 2015-11-26: 2000 [IU] via INTRAVENOUS

## 2015-11-26 MED ORDER — SODIUM CHLORIDE 0.9 % IV SOLN: INTRAVENOUS | Status: AC

## 2015-11-26 MED ORDER — LIDOCAINE HCL (PF) 1 % IJ SOLN
INTRAMUSCULAR | Status: DC | PRN
  Administered 2015-11-26: 12 mL
  Administered 2015-11-26: 15 mL

## 2015-11-26 MED ORDER — HEPARIN SODIUM (PORCINE) 1000 UNIT/ML IJ SOLN
INTRAMUSCULAR | Status: AC
  Filled 2015-11-26: qty 1

## 2015-11-26 MED ORDER — CLOPIDOGREL BISULFATE 75 MG PO TABS: 75.0000 mg | ORAL_TABLET | Freq: Every day | ORAL | 3 refills | Status: DC

## 2015-11-26 SURGICAL SUPPLY — 19 items
CATH ANGIO 5F PIGTAIL 65CM (CATHETERS) ×2 IMPLANT
CATH CROSS OVER TEMPO 5F (CATHETERS) ×2 IMPLANT
CATH SOS OMNI O 5F 80CM (CATHETERS) ×2 IMPLANT
CATH STRAIGHT 5FR 65CM (CATHETERS) ×2 IMPLANT
DEVICE CLOSURE MYNXGRIP 6/7F (Vascular Products) ×1 IMPLANT
DEVICE CONTINUOUS FLUSH (MISCELLANEOUS) ×2 IMPLANT
KIT ENCORE 26 ADVANTAGE (KITS) ×4 IMPLANT
KIT PV (KITS) ×2 IMPLANT
PACK CARDIAC CATHETERIZATION (CUSTOM PROCEDURE TRAY) ×2 IMPLANT
SHEATH BRITE TIP 7FR 35CM (SHEATH) ×4 IMPLANT
SHEATH PINNACLE 5F 10CM (SHEATH) ×2 IMPLANT
SHEATH PINNACLE 7F 10CM (SHEATH) ×2 IMPLANT
STENT EXPRESS LD 8X27X75 (Permanent Stent) ×1 IMPLANT
STENT EXPRESS LD 9X25X75 (Permanent Stent) ×1 IMPLANT
SYRINGE MEDRAD AVANTA MACH 7 (SYRINGE) ×2 IMPLANT
TAPE RADIOPAQUE TURBO (MISCELLANEOUS) ×4 IMPLANT
TRANSDUCER W/STOPCOCK (MISCELLANEOUS) ×2 IMPLANT
TUBING CIL FLEX 10 FLL-RA (TUBING) ×2 IMPLANT
WIRE HITORQ VERSACORE ST 145CM (WIRE) ×4 IMPLANT

## 2015-11-26 NOTE — Progress Notes (Signed)
Site area: Scientific laboratory technician Prior to Removal:  Level 0 Pressure Applied For:35 min Manual:   yes Patient Status During Pull:  A/O Post Pull Site:  Level 0 Post Pull Instructions Given:  Yes. Pt understands post orders Post Pull Pulses Present: Doppler rt dp/pt Lt dp/pt Dressing Applied:  tegaderm and a 4x4 Bedrest begins @ 12:00:00 Comments: Pt leaves ha in stable condition. Rt groin has a slight discoloration where sheath was inserted. Dressing to rt and lt groin is CDI.

## 2015-11-26 NOTE — Progress Notes (Signed)
Results for YAVUZ, BLACKLEY (MRN [id number]) as of 11/26/2015 14:03  Ref. Range 11/26/2015 06:36 11/26/2015 10:29  Glucose-Capillary Latest Ref Range: 65 - 99 mg/dL 272 (H) 240 (H)   Noted that patient's blood sugars have been greater than 180 mg/dl.  Patient takes Lantus 14 units BID at home, along with Metformin. Recommend starting Lantus 10-14 units daily and Novolog SENSITIVE correction scale TID & HS while in the hospital if blood sugars continue to be elevated. Titrate as needed.  Check blood sugars TID & HS if eating. Harvel Ricks RN BSN CDE

## 2015-11-26 NOTE — H&P (View-Only) (Signed)
Cardiology Office Note   Date:  11/18/2015   ID:  Marcus, Norton 26-Oct-1940, MRN [id number]  PCP:  Robert Bellow, MD  Cardiologist: [name]  Chief Complaint  Patient presents with  . Follow-up    dopplers/PVD      History of Present Illness: Marcus Norton is a 75 y.o. male who presents for a follow-up visit regarding  claudication and PAD. He has known history of  paroxysmal supraventricular tachycardia, nonobstructive coronary artery disease, type 2 diabetes since 1990, hypertension and hyperlipidemia. He has known history of claudication due to suspected SFA disease. He did not tolerate cilostazol due to palpitations. He was treated medically. However, he now reports worsening claudication especially on the right side. He used to go to the gym 4 times a week. He currently goes at most one time a week and is limited by calf claudication. He denies any rest pain or lower extremity ulceration. He had recent ABI which was 0.71 on the right and 0.83 on the left.    Past Medical History:  Diagnosis Date  . 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 ASCVD (arteriosclerotic cardiovascular disease)    -luminal irregularities in 1998 and 2005; normal EF  . Benign prostatic hypertrophy    status post transurethral resection of the prostate  . Diabetes mellitus   . Dysphagia   . Erectile dysfunction   . Hyperlipidemia   . Hypertension   . Seasonal allergies   . Tobacco abuse    20 pack years; discontinued 1995    Past Surgical History:  Procedure Laterality Date  . APPENDECTOMY    . COLONOSCOPY  07/20/2011   Dr. Gala Romney: multiple hyperplastic polyps. Surveillance 2018  . COLONOSCOPY W/ BIOPSIES AND POLYPECTOMY  07/08/08, 06/2011   Dr Madolyn Frieze papilla, diminutive rectal polyp ablated the, left-sided diverticula, piecemeal polypectomy of hepatic flexure adenomatous polyp, diminutive polyps near hepatic flexure ablated  . ESOPHAGOGASTRODUODENOSCOPY  2013   erosive  reflux esophagitis, s/p empiric dilation. chronic duodenitis.   [name] NASAL SEPTUM SURGERY     Correction of deviated septum  . TONSILLECTOMY    . TRANSURETHRAL RESECTION OF PROSTATE  2009     Current Outpatient Prescriptions  Medication Sig Dispense Refill  . aspirin EC 81 MG tablet Take 81 mg by mouth every morning.    [name] atorvastatin (LIPITOR) 10 MG tablet Take 5 mg by mouth daily.     [name] LANTUS SOLOSTAR 100 UNIT/ML Solostar Pen Inject 14 Units into the skin 2 (two) times daily.  11  . lisinopril (PRINIVIL,ZESTRIL) 20 MG tablet Take 1 tablet (20 mg total) by mouth daily. 30 tablet 6  . metFORMIN (GLUCOPHAGE) 500 MG tablet Take 1,000 mg by mouth 2 (two) times daily with a meal. 2 tabs bid     . nitroGLYCERIN (NITROSTAT) 0.4 MG SL tablet Place 1 tablet (0.4 mg total) under the tongue every 5 (five) minutes x 3 doses as needed. 30 tablet 3  . omeprazole (PRILOSEC) 20 MG capsule Take 20 mg by mouth daily.     No current facility-administered medications for this visit.     Allergies:   Oxycontin [oxycodone hcl]; Penicillins; Pioglitazone; Prednisone; and Simvastatin    Social History:  The patient  reports that he quit smoking about 22 years ago. His smoking use included Cigarettes. He started smoking about 50 years ago. He has a 20.00 pack-year smoking history. He has never used smokeless tobacco. He reports that he drinks alcohol. He reports that  he does not use drugs.   Family History:  The patient's family history includes Colon cancer (age of onset: 25) in his sister; Ovarian cancer in his mother.    ROS:  Please see the history of present illness.   Otherwise, review of systems are positive for none.   All other systems are reviewed and negative.    PHYSICAL EXAM: VS:  BP (!) 144/62   Pulse 78   Ht 5\' 7"  (1.702 m)   Wt 175 lb 9.6 oz (79.7 kg)   BMI 27.50 kg/m  , BMI Body mass index is 27.5 kg/m. GEN: Well nourished, well developed, in no acute distress  HEENT: normal  Neck:  no JVD, carotid bruits, or masses Cardiac: RRR; no  rubs, or gallops,no edema . 2/6 crescendo decrescendo systolic murmur in the aortic area which is mid peaking Respiratory:  clear to auscultation bilaterally, normal work of breathing GI: soft, nontender, nondistended, + BS MS: no deformity or atrophy  Skin: warm and dry, no rash Neuro:  Strength and sensation are intact Psych: euthymic mood, full affect Vascular: Femoral pulse: Normal bilaterally. Dorsalis pedis is palpable on the left side and not the right side. Posterior tibial is not palpable.  EKG:  EKG is not ordered today.   Recent Labs: 09/24/2015: BUN 28; Creatinine, Ser 0.98; Hemoglobin 14.9; Platelets 250; Potassium 5.0; Sodium 133    Lipid Panel    Component Value Date/Time   CHOL 156 03/12/2008   TRIG 96 03/12/2008   HDL 46 03/12/2008   LDLCALC 91 03/12/2008      Wt Readings from Last 3 Encounters:  11/18/15 175 lb 9.6 oz (79.7 kg)  10/21/15 174 lb (78.9 kg)  06/09/15 178 lb 9.6 oz (81 kg)       No flowsheet data found.    ASSESSMENT AND PLAN:  1.  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 The patient has cut down on his physical activity significantly due to the symptoms and used to be more active. He did not tolerate cilostazol in the past. Due to progression of his symptoms, I recommend proceeding with abdominal aortogram, lower extremity runoff and possible endovascular intervention. I discussed the procedure in details as well as risk and benefits.  2. Cardiac murmur: Suggestive of aortic stenosis on I requested an echocardiogram.  3. Hyperlipidemia: Continue treatment with atorvastatin with a target LDL of less than 70.  4. Essential hypertension: Controlled on lisinopril.    Disposition:   FU with me in 1 month  Signed,  [name], MD  11/18/2015 10:14 AM    [place]

## 2015-11-26 NOTE — Discharge Instructions (Signed)
Resume Metformin in 2 days.  Start Plavix 75 mg once daily. A prescription was sent to your pharmacy.   Angiogram, Care After These instructions give you information about caring for yourself after your procedure. Your doctor may also give you more specific instructions. Call your doctor if you have any problems or questions after your procedure.  HOME CARE  Take medicines only as told by your doctor.  Follow your doctor's instructions about:  Care of the area where the tube was inserted.  Bandage (dressing) changes and removal.  You may shower 24-48 hours after the procedure or as told by your doctor.  Do not take baths, swim, or use a hot tub until your doctor approves.  Every day, check the area where the tube was inserted. Watch for:  Redness, swelling, or pain.  Fluid, blood, or pus.  Do not apply powder or lotion to the site.  Do not lift anything that is heavier than 10 lb (4.5 kg) for 5 days or as told by your doctor.  Ask your doctor when you can:  Return to work or school.  Do physical activities or play sports.  Have sex.  Do not drive or operate heavy machinery for 24 hours or as told by your doctor.  Have someone with you for the first 24 hours after the procedure.  Keep all follow-up visits as told by your doctor. This is important. GET HELP IF:  You have a fever.   You have chills.   You have more bleeding from the area where the tube was inserted. Hold pressure on the area.  You have redness, swelling, or pain in the area where the tube was inserted.  You have fluid or pus coming from the area. GET HELP RIGHT AWAY IF:   You have a lot of pain in the area where the tube was inserted.  The area where the tube was inserted is bleeding, and the bleeding does not stop after 30 minutes of holding steady pressure on the area.  The area near or just beyond the insertion site becomes pale, cool, tingly, or numb.   This information is not intended  to replace advice given to you by your health care provider. Make sure you discuss any questions you have with your health care provider.   Document Released: 05/07/2008 Document Revised: 03/01/2014 Document Reviewed: 07/12/2012 Elsevier Interactive Patient Education [employer].

## 2015-11-26 NOTE — Interval H&P Note (Signed)
History and Physical Interval Note:  11/26/2015 8:39 AM  Marcus Norton  has presented today for surgery, with the diagnosis of pvd  The various methods of treatment have been discussed with the patient and family. After consideration of risks, benefits and other options for treatment, the patient has consented to  Procedure(s): Abdominal Aortogram w/Lower Extremity (N/A) as a surgical intervention .  The patient's history has been reviewed, patient examined, no change in status, stable for surgery.  I have reviewed the patient's chart and labs.  Questions were answered to the patient's satisfaction.     [name]

## 2015-11-27 ENCOUNTER — Encounter (HOSPITAL_COMMUNITY): Payer: Self-pay | Admitting: Cardiovascular Disease

## 2015-11-30 ENCOUNTER — Emergency Department (HOSPITAL_COMMUNITY): Payer: Medicare Other

## 2015-11-30 ENCOUNTER — Emergency Department (HOSPITAL_COMMUNITY)
Admission: EM | Admit: 2015-11-30 | Discharge: 2015-11-30 | Disposition: A | Discharge status: Home / Self Care | Payer: Medicare Other | Attending: Emergency Medicine | Admitting: Emergency Medicine

## 2015-11-30 ENCOUNTER — Encounter (HOSPITAL_COMMUNITY): Payer: Self-pay | Admitting: Emergency Medicine

## 2015-11-30 DIAGNOSIS — Z79899 Other long term (current) drug therapy: Secondary | ICD-10-CM | POA: Diagnosis not present

## 2015-11-30 DIAGNOSIS — I10 Essential (primary) hypertension: Secondary | ICD-10-CM | POA: Diagnosis not present

## 2015-11-30 DIAGNOSIS — R072 Precordial pain: Secondary | ICD-10-CM | POA: Diagnosis not present

## 2015-11-30 DIAGNOSIS — Z7982 Long term (current) use of aspirin: Secondary | ICD-10-CM | POA: Insufficient documentation

## 2015-11-30 DIAGNOSIS — Z87891 Personal history of nicotine dependence: Secondary | ICD-10-CM | POA: Insufficient documentation

## 2015-11-30 DIAGNOSIS — R079 Chest pain, unspecified: Secondary | ICD-10-CM | POA: Diagnosis present

## 2015-11-30 DIAGNOSIS — Z7984 Long term (current) use of oral hypoglycemic drugs: Secondary | ICD-10-CM | POA: Diagnosis not present

## 2015-11-30 DIAGNOSIS — R0789 Other chest pain: Secondary | ICD-10-CM

## 2015-11-30 DIAGNOSIS — Z794 Long term (current) use of insulin: Secondary | ICD-10-CM | POA: Insufficient documentation

## 2015-11-30 DIAGNOSIS — E1165 Type 2 diabetes mellitus with hyperglycemia: Secondary | ICD-10-CM | POA: Insufficient documentation

## 2015-11-30 DIAGNOSIS — R739 Hyperglycemia, unspecified: Secondary | ICD-10-CM

## 2015-11-30 LAB — BASIC METABOLIC PANEL
ANION GAP: 8 (ref 5–15)
BUN: 20 mg/dL (ref 6–20)
CHLORIDE: 99 mmol/L — AB (ref 101–111)
CO2: 25 mmol/L (ref 22–32)
Calcium: 9.7 mg/dL (ref 8.9–10.3)
Creatinine, Ser: 0.76 mg/dL (ref 0.61–1.24)
GFR calc non Af Amer: >60 mL/min (ref >60)
GLUCOSE: 275 mg/dL — AB (ref 65–99)
Potassium: 5.5 mmol/L — ABNORMAL HIGH (ref 3.5–5.1)
Sodium: 132 mmol/L — ABNORMAL LOW (ref 135–145)

## 2015-11-30 LAB — CBG MONITORING, ED: Glucose-Capillary: 270 mg/dL — ABNORMAL HIGH (ref 65–99)

## 2015-11-30 LAB — I-STAT TROPONIN, ED
Troponin i, poc: 0 ng/mL (ref 0.00–0.08)
Troponin i, poc: 0 ng/mL (ref 0.00–0.08)

## 2015-11-30 LAB — CBC
HCT: 43.9 % (ref 39.0–52.0)
HEMOGLOBIN: 15.2 g/dL (ref 13.0–17.0)
MCH: 34.3 pg — AB (ref 26.0–34.0)
MCHC: 34.6 g/dL (ref 30.0–36.0)
MCV: 99.1 fL (ref 78.0–100.0)
Platelets: 271 10*3/uL (ref 150–400)
RBC: 4.43 MIL/uL (ref 4.22–5.81)
RDW: 13 % (ref 11.5–15.5)
WBC: 10.6 10*3/uL — ABNORMAL HIGH (ref 4.0–10.5)

## 2015-11-30 MED ORDER — SODIUM CHLORIDE 0.9 % IV BOLUS (SEPSIS)
1000.0000 mL | Freq: Once | INTRAVENOUS | Status: AC
  Administered 2015-11-30: 1000 mL via INTRAVENOUS

## 2015-11-30 MED ORDER — SODIUM CHLORIDE 0.9 % IV BOLUS (SEPSIS)
500.0000 mL | Freq: Once | INTRAVENOUS | Status: AC
  Administered 2015-11-30: 500 mL via INTRAVENOUS

## 2015-11-30 MED ORDER — INSULIN ASPART 100 UNIT/ML ~~LOC~~ SOLN
6.0000 [IU] | Freq: Once | SUBCUTANEOUS | Status: AC
  Administered 2015-11-30: 6 [IU] via INTRAVENOUS
  Filled 2015-11-30: qty 1

## 2015-11-30 NOTE — Discharge Instructions (Signed)
It was our pleasure to provide your ER care today - we hope that you feel better.  Follow up with primary care doctor in the next couple of days.   From today's lab tests both sets of heart enzyme levels are good/normal.  Your blood sugar is high (275) - continue your diabetic medication, follow diabetic diet, and monitor your sugars - follow up with primary care doctor in the coming week.  Return to ER if worse, recurrent or persistent chest pain, trouble breathing, other concern.

## 2015-11-30 NOTE — ED Notes (Signed)
Pt stable, ambulatory, states understanding of discharge instructions 

## 2015-11-30 NOTE — ED Provider Notes (Signed)
[facility] DEPT Provider Note   CSN: [id number] Arrival date & time: 11/30/15  1546     History   Chief Complaint Chief Complaint  Patient presents with  . Chest Pain  . Hyperglycemia    HPI Marcus Norton is a 75 y.o. male.  Patient c/o episode chest pain at rest earlier today. Mid chest non radiating. States also had shooting pain in left leg and left shoulder area - lasted several seconds. Currently denies any chest pain or discomfort.  Patient denies any more prolonged chest pain. No exertional cp. Denies associated sob, nv, or diaphoresis. No palpitations. Denies hx cad. No cough or uri c/o. No fever or chills.    The history is provided by the patient.  Chest Pain   Pertinent negatives include no abdominal pain, no back pain, no cough, no fever, no headaches, no palpitations and no shortness of breath.  Hyperglycemia  Associated symptoms: chest pain   Associated symptoms: no abdominal pain, no confusion, no fever and no shortness of breath     Past Medical History:  Diagnosis Date  . 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 ASCVD (arteriosclerotic cardiovascular disease)    -luminal irregularities in 1998 and 2005; normal EF  . Benign prostatic hypertrophy    status post transurethral resection of the prostate  . Diabetes mellitus   . Dysphagia   . Erectile dysfunction   . Hyperlipidemia   . Hypertension   . Seasonal allergies   . Tobacco abuse    20 pack years; discontinued 1995    Patient Active Problem List   Diagnosis Date Noted  . 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 Nausea without vomiting 06/09/2015  . PAD (peripheral artery disease) ([place]) 02/26/2014  . Palpitations 04/04/2013  . Arteriosclerotic cardiovascular disease -nonobstructive   . Diabetes mellitus, type 2 ([place])   . Hypertension   . Tobacco abuse   . Hyperlipidemia 07/11/2009  . 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 Abnormal liver function tests 07/11/2009    Past  Surgical History:  Procedure Laterality Date  . APPENDECTOMY    . COLONOSCOPY  07/20/2011   Dr. Gala Romney: multiple hyperplastic polyps. Surveillance 2018  . COLONOSCOPY W/ BIOPSIES AND POLYPECTOMY  07/08/08, 06/2011   Dr Madolyn Frieze papilla, diminutive rectal polyp ablated the, left-sided diverticula, piecemeal polypectomy of hepatic flexure adenomatous polyp, diminutive polyps near hepatic flexure ablated  . ESOPHAGOGASTRODUODENOSCOPY  2013   erosive reflux esophagitis, s/p empiric dilation. chronic duodenitis.   [name] NASAL SEPTUM SURGERY     Correction of deviated septum  . PERIPHERAL VASCULAR CATHETERIZATION N/A 11/26/2015   Procedure: Abdominal Aortogram w/Lower Extremity;  Surgeon: Wellington Hampshire, MD;  Location: [facility];  Service: Cardiovascular;  Laterality: N/A;  . TONSILLECTOMY    . TRANSURETHRAL RESECTION OF PROSTATE  2009       Home Medications    Prior to Admission medications   Medication Sig Start Date End Date Taking? Authorizing Provider  aspirin EC 81 MG tablet Take 81 mg by mouth every morning.    Historical Provider, MD  atorvastatin (LIPITOR) 10 MG tablet Take 5 mg by mouth daily.  06/27/11   Historical Provider, MD  clopidogrel (PLAVIX) 75 MG tablet Take 1 tablet (75 mg total) by mouth daily. 11/26/15   Wellington Hampshire, MD  Cyanocobalamin (VITAMIN B 12 PO) Take 1 tablet by mouth daily.    Historical Provider, MD  LANTUS SOLOSTAR 100 UNIT/ML Solostar Pen Inject 14 Units into the skin 2 (  two) times daily. 04/22/14   Historical Provider, MD  lisinopril (PRINIVIL,ZESTRIL) 20 MG tablet Take 1 tablet (20 mg total) by mouth daily. 07/10/10   Yehuda Savannah, MD  metFORMIN (GLUCOPHAGE) 500 MG tablet Take 1,000 mg by mouth 2 (two) times daily with a meal. 2 tabs bid     Historical Provider, MD  naproxen sodium (ANAPROX) 220 MG tablet Take 220 mg by mouth daily as needed (pain).    Historical Provider, MD  nitroGLYCERIN (NITROSTAT) 0.4 MG SL tablet Place 1 tablet (0.4 mg  total) under the tongue every 5 (five) minutes x 3 doses as needed. 01/12/12   Renella Cunas, MD  omeprazole (PRILOSEC) 20 MG capsule Take 20 mg by mouth daily.    Historical Provider, MD  tetrahydrozoline 0.05 % ophthalmic solution Place 2 drops into both eyes daily as needed (dry eyes).    Historical Provider, MD    Family History Family History  Problem Relation Age of Onset  . Ovarian cancer Mother   . Colon cancer Sister 15    deceased from colon cancer    Social History Social History  Substance Use Topics  . Smoking status: Former Smoker    Packs/day: 1.00    Years: 20.00    Types: Cigarettes    Start date: 03/08/1965    Quit date: 07/07/1993  . Smokeless tobacco: Never Used  . Alcohol use 0.0 oz/week     Comment: beer or mixed drink occasionally     Allergies   Oxycontin [oxycodone hcl]; Penicillins; Pioglitazone; Prednisone; and Simvastatin   Review of Systems Review of Systems  Constitutional: Negative for fever.  HENT: Negative for sore throat.   Eyes: Negative for redness.  Respiratory: Negative for cough and shortness of breath.   Cardiovascular: Positive for chest pain. Negative for palpitations and leg swelling.  Gastrointestinal: Negative for abdominal pain.  Genitourinary: Negative for flank pain.  Musculoskeletal: Negative for back pain and neck pain.  Skin: Negative for rash.  Neurological: Negative for headaches.  Hematological: Does not bruise/bleed easily.  Psychiatric/Behavioral: Negative for confusion.     Physical Exam Updated Vital Signs BP 141/67 (BP Location: Right Arm)   Pulse 92   Temp 97.5 F (36.4 C) (Oral)   Resp 17   SpO2 98%   Physical Exam  Constitutional: He appears well-developed and well-nourished. No distress.  HENT:  Mouth/Throat: Oropharynx is clear and moist.  Eyes: Conjunctivae are normal. No scleral icterus.  Neck: Neck supple. No tracheal deviation present.  Cardiovascular: Normal rate, regular rhythm, normal  heart sounds and intact distal pulses.  Exam reveals no gallop and no friction rub.   No murmur heard. Pulmonary/Chest: Effort normal and breath sounds normal. No accessory muscle usage. No respiratory distress. He exhibits no tenderness.  Abdominal: Soft. He exhibits no distension. There is no tenderness.  Musculoskeletal: He exhibits no edema or tenderness.  Neurological: He is alert.  Skin: Skin is warm and dry. No rash noted. He is not diaphoretic.  Psychiatric: He has a normal mood and affect.  Nursing note and vitals reviewed.    ED Treatments / Results  Labs (all labs ordered are listed, but only abnormal results are displayed) Results for orders placed or performed during the hospital encounter of [id number]  Basic metabolic panel  Result Value Ref Range   Sodium 132 (L) 135 - 145 mmol/L   Potassium 5.5 (H) 3.5 - 5.1 mmol/L   Chloride 99 (L) 101 - 111 mmol/L   CO2  25 22 - 32 mmol/L   Glucose, Bld 275 (H) 65 - 99 mg/dL   BUN 20 6 - 20 mg/dL   Creatinine, Ser 0.76 0.61 - 1.24 mg/dL   Calcium 9.7 8.9 - 10.3 mg/dL   GFR calc non Af Amer >60 >60 mL/min   GFR calc Af Amer >60 >60 mL/min   Anion gap 8 5 - 15  CBC  Result Value Ref Range   WBC 10.6 (H) 4.0 - 10.5 K/uL   RBC 4.43 4.22 - 5.81 MIL/uL   Hemoglobin 15.2 13.0 - 17.0 g/dL   HCT 43.9 39.0 - 52.0 %   MCV 99.1 78.0 - 100.0 fL   MCH 34.3 (H) 26.0 - 34.0 pg   MCHC 34.6 30.0 - 36.0 g/dL   RDW 13.0 11.5 - 15.5 %   Platelets 271 150 - 400 K/uL  CBG monitoring, ED  Result Value Ref Range   Glucose-Capillary 270 (H) 65 - 99 mg/dL   Comment 1 Notify RN   I-stat troponin, ED  Result Value Ref Range   Troponin i, poc 0.00 0.00 - 0.08 ng/mL   Comment 3           Dg Chest 2 View  Result Date: 11/30/2015 CLINICAL DATA:  Mid-sternal chest pressure, weakness and irregular heart rate. EXAM: CHEST  2 VIEW COMPARISON:  04/25/2014 FINDINGS: Cardiac silhouette is normal in size and configuration. Normal mediastinal and hilar  contours. Clear lungs.  No pleural effusion or pneumothorax. Skeletal structures are intact. IMPRESSION: No active cardiopulmonary disease. Electronically Signed   By: Lajean Manes M.D.   On: 11/30/2015 16:44    EKG  EKG Interpretation  Date/Time:  Sunday November 30 2015 15:53:42 EDT Ventricular Rate:  100 PR Interval:  150 QRS Duration: 98 QT Interval:  360 QTC Calculation: 464 R Axis:   -60 Text Interpretation:  Normal sinus rhythm Pulmonary disease pattern Incomplete right bundle branch block Left anterior fascicular block Minimal voltage criteria for LVH, may be normal variant Confirmed by Ashok Cordia  MD, Lennette Bihari ([id number]) on 11/30/2015 4:40:02 PM       Radiology Dg Chest 2 View  Result Date: 11/30/2015 CLINICAL DATA:  Mid-sternal chest pressure, weakness and irregular heart rate. EXAM: CHEST  2 VIEW COMPARISON:  04/25/2014 FINDINGS: Cardiac silhouette is normal in size and configuration. Normal mediastinal and hilar contours. Clear lungs.  No pleural effusion or pneumothorax. Skeletal structures are intact. IMPRESSION: No active cardiopulmonary disease. Electronically Signed   By: Lajean Manes M.D.   On: 11/30/2015 16:44    Procedures Procedures (including critical care time)  Medications Ordered in ED Medications  sodium chloride 0.9 % bolus 1,000 mL (not administered)     Initial Impression / Assessment and Plan / ED Course  I have reviewed the triage vital signs and the nursing notes.  Pertinent labs & imaging results that were available during my care of the patient were reviewed by me and considered in my medical decision making (see chart for details).  Clinical Course    Iv ns. Continuous pulse ox and monitor. o2 [place]. Labs.   Ecg.   Glucose elevated. Iv ns bolus.   Patient remains chest pain free, will check delta troponin.   Reviewed nursing notes and prior charts for additional history.   Initial and repeat trop normal.  On recheck, remains symptom free. No  cp.   Patient appears stable for d/c.     Final Clinical Impressions(s) / ED Diagnoses   Final diagnoses:  None    New Prescriptions New Prescriptions   No medications on file     Lajean Saver, MD 11/30/15 2013

## 2015-11-30 NOTE — ED Triage Notes (Signed)
Pt states "about an hour and a half ago, I felt pain in my chest and left neck and left leg. My lips feel a little numb." pt endorses CBG 312 this morning. CBG 270 here. Pt in NAD. Pt denies CP at this time. Pt denies pain anywhere. AAOx4. EKG being done in triage now

## 2015-12-01 ENCOUNTER — Ambulatory Visit (HOSPITAL_COMMUNITY): Payer: Medicare Other | Attending: Cardiovascular Disease

## 2015-12-01 ENCOUNTER — Other Ambulatory Visit: Payer: Self-pay

## 2015-12-01 DIAGNOSIS — E119 Type 2 diabetes mellitus without complications: Secondary | ICD-10-CM | POA: Diagnosis not present

## 2015-12-01 DIAGNOSIS — E785 Hyperlipidemia, unspecified: Secondary | ICD-10-CM | POA: Diagnosis not present

## 2015-12-01 DIAGNOSIS — Z87891 Personal history of nicotine dependence: Secondary | ICD-10-CM | POA: Insufficient documentation

## 2015-12-01 DIAGNOSIS — R011 Cardiac murmur, unspecified: Secondary | ICD-10-CM | POA: Diagnosis not present

## 2015-12-01 DIAGNOSIS — I10 Essential (primary) hypertension: Secondary | ICD-10-CM | POA: Insufficient documentation

## 2015-12-04 ENCOUNTER — Other Ambulatory Visit: Payer: Self-pay | Admitting: Cardiovascular Disease

## 2015-12-04 DIAGNOSIS — I739 Peripheral vascular disease, unspecified: Secondary | ICD-10-CM

## 2015-12-05 ENCOUNTER — Ambulatory Visit (HOSPITAL_COMMUNITY)
Admission: RE | Admit: 2015-12-05 | Discharge: 2015-12-05 | Disposition: A | Discharge status: Home / Self Care | Payer: Medicare Other | Source: Ambulatory Visit | Attending: Cardiology | Admitting: Cardiology

## 2015-12-05 DIAGNOSIS — I739 Peripheral vascular disease, unspecified: Secondary | ICD-10-CM | POA: Diagnosis not present

## 2015-12-05 DIAGNOSIS — Z87891 Personal history of nicotine dependence: Secondary | ICD-10-CM | POA: Insufficient documentation

## 2015-12-05 DIAGNOSIS — I700 Atherosclerosis of aorta: Secondary | ICD-10-CM | POA: Diagnosis not present

## 2015-12-05 DIAGNOSIS — E1151 Type 2 diabetes mellitus with diabetic peripheral angiopathy without gangrene: Secondary | ICD-10-CM | POA: Diagnosis not present

## 2015-12-05 DIAGNOSIS — I2510 Atherosclerotic heart disease of native coronary artery without angina pectoris: Secondary | ICD-10-CM | POA: Insufficient documentation

## 2015-12-05 DIAGNOSIS — E785 Hyperlipidemia, unspecified: Secondary | ICD-10-CM | POA: Insufficient documentation

## 2015-12-05 DIAGNOSIS — Z9889 Other specified postprocedural states: Secondary | ICD-10-CM | POA: Diagnosis not present

## 2015-12-05 DIAGNOSIS — I10 Essential (primary) hypertension: Secondary | ICD-10-CM | POA: Diagnosis not present

## 2015-12-08 ENCOUNTER — Emergency Department (HOSPITAL_COMMUNITY): Payer: Medicare Other

## 2015-12-08 ENCOUNTER — Other Ambulatory Visit: Payer: Self-pay

## 2015-12-08 ENCOUNTER — Emergency Department (HOSPITAL_COMMUNITY)
Admission: EM | Admit: 2015-12-08 | Discharge: 2015-12-08 | Disposition: A | Discharge status: Home / Self Care | Payer: Medicare Other | Attending: Emergency Medicine | Admitting: Emergency Medicine

## 2015-12-08 ENCOUNTER — Encounter (HOSPITAL_COMMUNITY): Payer: Self-pay | Admitting: Emergency Medicine

## 2015-12-08 DIAGNOSIS — I10 Essential (primary) hypertension: Secondary | ICD-10-CM | POA: Diagnosis not present

## 2015-12-08 DIAGNOSIS — Z79899 Other long term (current) drug therapy: Secondary | ICD-10-CM | POA: Diagnosis not present

## 2015-12-08 DIAGNOSIS — Z7902 Long term (current) use of antithrombotics/antiplatelets: Secondary | ICD-10-CM | POA: Insufficient documentation

## 2015-12-08 DIAGNOSIS — I2510 Atherosclerotic heart disease of native coronary artery without angina pectoris: Secondary | ICD-10-CM | POA: Insufficient documentation

## 2015-12-08 DIAGNOSIS — R531 Weakness: Secondary | ICD-10-CM | POA: Diagnosis present

## 2015-12-08 DIAGNOSIS — Z87891 Personal history of nicotine dependence: Secondary | ICD-10-CM | POA: Diagnosis not present

## 2015-12-08 DIAGNOSIS — Z7984 Long term (current) use of oral hypoglycemic drugs: Secondary | ICD-10-CM | POA: Diagnosis not present

## 2015-12-08 DIAGNOSIS — E860 Dehydration: Secondary | ICD-10-CM | POA: Insufficient documentation

## 2015-12-08 DIAGNOSIS — E119 Type 2 diabetes mellitus without complications: Secondary | ICD-10-CM | POA: Insufficient documentation

## 2015-12-08 DIAGNOSIS — Z7982 Long term (current) use of aspirin: Secondary | ICD-10-CM | POA: Diagnosis not present

## 2015-12-08 DIAGNOSIS — I951 Orthostatic hypotension: Secondary | ICD-10-CM

## 2015-12-08 LAB — COMPREHENSIVE METABOLIC PANEL
ALBUMIN: 4.5 g/dL (ref 3.5–5.0)
ALK PHOS: 54 U/L (ref 38–126)
ALT: 50 U/L (ref 17–63)
ANION GAP: 13 (ref 5–15)
AST: 34 U/L (ref 15–41)
BUN: 14 mg/dL (ref 6–20)
CO2: 24 mmol/L (ref 22–32)
Calcium: 10.3 mg/dL (ref 8.9–10.3)
Chloride: 98 mmol/L — ABNORMAL LOW (ref 101–111)
Creatinine, Ser: 0.77 mg/dL (ref 0.61–1.24)
GFR calc Af Amer: >60 mL/min (ref >60)
GFR calc non Af Amer: >60 mL/min (ref >60)
GLUCOSE: 123 mg/dL — AB (ref 65–99)
POTASSIUM: 5 mmol/L (ref 3.5–5.1)
SODIUM: 135 mmol/L (ref 135–145)
Total Bilirubin: 0.8 mg/dL (ref 0.3–1.2)
Total Protein: 7.6 g/dL (ref 6.5–8.1)

## 2015-12-08 LAB — URINALYSIS, ROUTINE W REFLEX MICROSCOPIC
BILIRUBIN URINE: NEGATIVE
GLUCOSE, UA: NEGATIVE mg/dL
HGB URINE DIPSTICK: NEGATIVE
Ketones, ur: NEGATIVE mg/dL
Leukocytes, UA: NEGATIVE
Nitrite: NEGATIVE
PH: 6.5 (ref 5.0–8.0)
Protein, ur: NEGATIVE mg/dL
SPECIFIC GRAVITY, URINE: 1.028 (ref 1.005–1.030)

## 2015-12-08 LAB — CBC
HEMATOCRIT: 44.2 % (ref 39.0–52.0)
HEMOGLOBIN: 15.2 g/dL (ref 13.0–17.0)
MCH: 33.4 pg (ref 26.0–34.0)
MCHC: 34.4 g/dL (ref 30.0–36.0)
MCV: 97.1 fL (ref 78.0–100.0)
Platelets: 355 10*3/uL (ref 150–400)
RBC: 4.55 MIL/uL (ref 4.22–5.81)
RDW: 13.1 % (ref 11.5–15.5)
WBC: 9.6 10*3/uL (ref 4.0–10.5)

## 2015-12-08 LAB — I-STAT TROPONIN, ED: Troponin i, poc: 0 ng/mL (ref 0.00–0.08)

## 2015-12-08 LAB — CK: Total CK: 48 U/L — ABNORMAL LOW (ref 49–397)

## 2015-12-08 LAB — I-STAT CG4 LACTIC ACID, ED: Lactic Acid, Venous: 1.8 mmol/L (ref 0.5–1.9)

## 2015-12-08 LAB — PROTIME-INR
INR: 1.02
Prothrombin Time: 13.4 seconds (ref 11.4–15.2)

## 2015-12-08 MED ORDER — SODIUM CHLORIDE 0.9 % IV BOLUS (SEPSIS)
1000.0000 mL | Freq: Once | INTRAVENOUS | Status: AC
  Administered 2015-12-08: 1000 mL via INTRAVENOUS

## 2015-12-08 MED ORDER — IOPAMIDOL (ISOVUE-370) INJECTION 76%
INTRAVENOUS | Status: AC
  Administered 2015-12-08: 100 mL
  Filled 2015-12-08: qty 100

## 2015-12-08 NOTE — ED Triage Notes (Signed)
Pt sts generalized weakness starting this am with numbness in both legs and abd pain; pt sts just started plavix 1 week ago and had stents placed in femoral artery

## 2015-12-08 NOTE — ED Notes (Signed)
I ambulated patient in the hallway around the nurses' station and back to him room per Ellender Hose, MD order; visitor waited in room at bedside

## 2015-12-08 NOTE — ED Notes (Signed)
Brought patient back to room via wheelchair with family in tow; patient undressed, in gown, on monitor, continuous pulse oximetry and blood pressure cuff; visitor at bedside

## 2015-12-08 NOTE — ED Provider Notes (Signed)
[facility] DEPT Provider Note   CSN: [id number] Arrival date & time: 12/08/15  1141     History   Chief Complaint Chief Complaint  Patient presents with  . Weakness    HPI Marcus Norton is a 75 y.o. male.  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 The patient states that his symptoms started 4 days ago with acute onset of generalized fatigue and abdominal discomfort. He also had tightness in his bilateral legs. This resolved. He noted to have blood sugar of 50s at the time but this resolved after eating. Over the weekend, he had no recurrence of symptoms until last night. Last night, patient experienced acute onset of generalized uneasiness, fatigue, and abdominal discomfort with bilateral leg cramping and numbness. The symptoms persisted throughout the night. His blood sugar was normal and even elevated at this time in the 150s to 200s. Denies any associated chest pain but he did have some chest discomfort. Denies any recent medication changes. Denies any recent changes to his insulin. He has been compliant with his Plavix. His symptoms are currently largely improved. Denies any specific alleviating factors.  Past Medical History:  Diagnosis Date  . 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 ASCVD (arteriosclerotic cardiovascular disease)    -luminal irregularities in 1998 and 2005; normal EF  . Benign prostatic hypertrophy    status post transurethral resection of the prostate  . Diabetes mellitus   . Dysphagia   . Erectile dysfunction   . Hyperlipidemia   . Hypertension   . Seasonal allergies   . Tobacco abuse    20 pack years; discontinued 1995    Patient Active Problem List   Diagnosis Date Noted  . Claudication of calf muscles ([place]) 10/21/2015  . Nausea without vomiting 06/09/2015  . PAD (peripheral artery disease)  ([place]) 02/26/2014  . Palpitations 04/04/2013  . Arteriosclerotic cardiovascular disease -nonobstructive   . Diabetes mellitus, type 2 ([place])   . Hypertension   . Tobacco abuse   . Hyperlipidemia 07/11/2009  . 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 Abnormal liver function tests 07/11/2009    Past Surgical History:  Procedure Laterality Date  . APPENDECTOMY    . COLONOSCOPY  07/20/2011   Dr. Gala Romney: multiple hyperplastic polyps. Surveillance 2018  . COLONOSCOPY W/ BIOPSIES AND POLYPECTOMY  07/08/08, 06/2011   Dr Madolyn Frieze papilla, diminutive rectal polyp ablated the, left-sided diverticula, piecemeal polypectomy of hepatic flexure adenomatous polyp, diminutive polyps near hepatic flexure ablated  . ESOPHAGOGASTRODUODENOSCOPY  2013   erosive reflux esophagitis, s/p empiric dilation. chronic duodenitis.   [name] NASAL SEPTUM SURGERY     Correction of deviated septum  . PERIPHERAL VASCULAR CATHETERIZATION N/A 11/26/2015   Procedure: Abdominal Aortogram w/Lower Extremity;  Surgeon: Wellington Hampshire, MD;  Location: [facility];  Service: Cardiovascular;  Laterality: N/A;  . TONSILLECTOMY    . TRANSURETHRAL RESECTION OF PROSTATE  2009       Home Medications    Prior to Admission medications   Medication Sig Start Date End Date Taking? Authorizing Provider  acetaminophen (TYLENOL) 500 MG tablet Take 500 mg by mouth daily as needed for mild pain.   Yes Historical Provider, MD  aspirin EC 81 MG tablet Take 81 mg by mouth daily at 6 PM.    Yes Historical Provider, MD  atorvastatin (LIPITOR) 10 MG tablet Take 5 mg by mouth every other day.  06/27/11  Yes Historical Provider, MD  clopidogrel (PLAVIX) 75 MG tablet Take 1 tablet (75 mg total) by mouth daily. 11/26/15  Yes Wellington Hampshire, MD  Cyanocobalamin (VITAMIN B 12 PO) Take 2,000 Units by mouth daily.    Yes Historical Provider, MD  LANTUS SOLOSTAR 100 UNIT/ML Solostar Pen Inject 14 Units into the skin 2 (two) times daily. 04/22/14  Yes  Historical Provider, MD  lisinopril (PRINIVIL,ZESTRIL) 20 MG tablet Take 1 tablet (20 mg total) by mouth daily. 07/10/10  Yes Yehuda Savannah, MD  metFORMIN (GLUCOPHAGE) 500 MG tablet Take 1,000 mg by mouth 2 (two) times daily with a meal.    Yes Historical Provider, MD  nitroGLYCERIN (NITROSTAT) 0.4 MG SL tablet Place 1 tablet (0.4 mg total) under the tongue every 5 (five) minutes x 3 doses as needed. Patient taking differently: Place 0.4 mg under the tongue every 5 (five) minutes x 3 doses as needed for chest pain.  01/12/12  Yes Renella Cunas, MD  tetrahydrozoline 0.05 % ophthalmic solution Place 2 drops into both eyes daily as needed (dry eyes).   Yes Historical Provider, MD    Family History Family History  Problem Relation Age of Onset  . Ovarian cancer Mother   . Colon cancer Sister 68    deceased from colon cancer    Social History Social History  Substance Use Topics  . Smoking status: Former Smoker    Packs/day: 1.00    Years: 20.00    Types: Cigarettes    Start date: 03/08/1965    Quit date: 07/07/1993  . Smokeless tobacco: Never Used  . Alcohol use 0.0 oz/week     Comment: beer or mixed drink occasionally     Allergies   Oxycontin [oxycodone hcl]; Penicillins; Pioglitazone; Prednisone; and Simvastatin   Review of Systems Review of Systems  Constitutional: Positive for fatigue. Negative for chills, diaphoresis and fever.  HENT: Negative for congestion and rhinorrhea.   Eyes: Negative for visual disturbance.  Respiratory: Negative for cough, chest tightness, shortness of breath and wheezing.   Cardiovascular: Negative for chest pain and leg swelling.  Gastrointestinal: Positive for constipation and nausea. Negative for abdominal pain, diarrhea and vomiting.  Genitourinary: Negative for dysuria and flank pain.  Musculoskeletal: Negative for neck pain and neck stiffness.  Skin: Negative for rash and wound.  Allergic/Immunologic: Negative for immunocompromised  state.  Neurological: Positive for weakness. Negative for syncope and headaches.  All other systems reviewed and are negative.    Physical Exam Updated Vital Signs BP (!) 102/43   Pulse 80   Temp 97.2 F (36.2 C) (Oral)   Resp 15   SpO2 97%   Physical Exam  Constitutional: He is oriented to person, place, and time. He appears well-developed and well-nourished. No distress.  HENT:  Head: Normocephalic and atraumatic.  Dry mucous membranes  Eyes: Conjunctivae are normal.  Neck: Neck supple.  Cardiovascular: Normal rate, regular rhythm and normal heart sounds.  Exam reveals no friction rub.   No murmur heard. Pulmonary/Chest: Effort normal and breath sounds normal. No respiratory distress. He has no wheezes. He has no rales.  Abdominal: He exhibits no distension.  Musculoskeletal: He exhibits no edema.  Neurological: He is alert and oriented to person, place, and time. He exhibits normal muscle tone.  Skin: Skin is warm. Capillary refill takes less than 2 seconds.  Psychiatric: He has a normal mood and affect.  Nursing note and vitals reviewed.   LOWER EXTREMITY EXAM: BILATERAL  INSPECTION & PALPATION: Bilateral inguinal hematomas are soft, with resolving bleeding No open wounds Palpable femoral and DP pulses as below  SENSORY: sensation is intact to light touch in:  Superficial peroneal nerve distribution (over dorsum of foot) Deep peroneal nerve distribution (over first dorsal web space) Sural nerve distribution (over lateral aspect 5th metatarsal) Saphenous nerve distribution (over medial instep)  MOTOR:  + Motor EHL (great toe dorsiflexion) + FHL (great toe plantar flexion)  + TA (ankle dorsiflexion)  + GSC (ankle plantar flexion)  VASCULAR: 2+ dorsalis pedis and posterior tibialis pulses Capillary refill < 2 sec, toes warm and well-perfused  COMPARTMENTS: Soft, warm, well-perfused No pain with passive extension No parethesias    ED Treatments / Results   Labs (all labs ordered are listed, but only abnormal results are displayed) Labs Reviewed  COMPREHENSIVE METABOLIC PANEL - Abnormal; Notable for the following:       Result Value   Chloride 98 (*)    Glucose, Bld 123 (*)    All other components within normal limits  CK - Abnormal; Notable for the following:    Total CK 48 (*)    All other components within normal limits  CBC  URINALYSIS, ROUTINE W REFLEX MICROSCOPIC (NOT AT [facility])  PROTIME-INR  Randolm Idol, ED  I-STAT CG4 LACTIC ACID, ED    EKG  EKG Interpretation  Date/Time:  Monday December 08 2015 11:52:33 EDT Ventricular Rate:  84 PR Interval:  160 QRS Duration: 112 QT Interval:  382 QTC Calculation: 451 R Axis:   -47 Text Interpretation:  Normal sinus rhythm Incomplete right bundle branch block Left anterior fascicular block Minimal voltage criteria for LVH, may be normal variant Possible Anterior infarct , age undetermined Abnormal ECG No significant change since last tracing Confirmed by Ruthvik Barnaby MD, Lysbeth Galas [phone]) on 12/08/2015 3:27:28 PM       Radiology Dg Chest 2 View  Result Date: 12/08/2015 CLINICAL DATA:  Lower extremity weakness with abdominal pain. Hypertension. EXAM: CHEST  2 VIEW COMPARISON:  November 30, 2015 FINDINGS: There are apparent nipple shadows bilaterally. There is no edema or consolidation. The heart size and pulmonary vascularity are normal. No adenopathy. There is atherosclerotic calcification in the aorta. There is also calcification in the right carotid artery. There is degenerative change in each shoulder. There is degenerative change in the thoracic spine with diffuse idiopathic skeletal hyperostosis. IMPRESSION: Apparent nipple shadows bilaterally. No edema or consolidation. Aortic atherosclerosis. There is also calcification in the right carotid artery. Electronically Signed   By: Lowella Grip III M.D.   On: 12/08/2015 15:58   Ct Angio Aortobifemoral W And/or Wo Contrast  Result  Date: 12/08/2015 CLINICAL DATA:  Recently placed iliac artery stents. Patient complains of tightness and legs. Recent abdominal discomfort. EXAM: CT ANGIOGRAPHY OF ABDOMINAL AORTA WITH ILIOFEMORAL RUNOFF TECHNIQUE: Multidetector CT imaging of the abdomen, pelvis and lower extremities was performed using the standard protocol during bolus administration of intravenous contrast. Multiplanar CT image reconstructions and MIPs were obtained to evaluate the vascular anatomy. CONTRAST:  100 mL Isovue 370 COMPARISON:  None. FINDINGS: VASCULAR Aorta: Mild atherosclerotic disease in the distal descending thoracic aorta without aneurysm or dissection. Normal caliber of the abdominal aorta with circumferential calcifications but no significant aortic stenosis. There are bilateral common iliac artery stents placed at the bifurcation. Celiac: Origin of the celiac trunk is patent with mild atherosclerotic disease. Narrowing of the celiac trunk approximately 1 cm from the origin appears to be related to median arcuate ligament  compression. SMA: Mild atherosclerotic disease in the SMA without significant stenosis. Renals: Calcified plaque at the origin of bilateral renal arteries. There is at least mild stenosis involving the proximal right renal artery. Mild narrowing at the origin left renal artery. IMA: IMA is patent. RIGHT Lower Extremity Inflow: There is a right common iliac artery stent which is widely patent. Calcified plaque throughout the right iliac arteries. Narrowing at the origin the right internal iliac artery from calcified plaque. Right external iliac artery is patent. Outflow: Calcified plaque in the right common femoral artery without significant stenosis. Soft tissue changes around the right groin from recent arterial intervention. Right deep femoral arteries are patent. Moderate stenosis in the proximal right SFA due to circumferential plaque. Large amount of calcified plaque in the mid and distal right SFA.  Right SFA is diffusely diseased with severe segmental disease throughout the mid and distal aspect. There may be a short-segment occlusion in the distal right SFA. The right popliteal artery is patent. Runoff: Three-vessel runoff in the proximal calf but the anterior tibial artery occludes in the mid calf. The primary runoff is the posterior tibial artery. Posterior tibial artery is patent across the ankle. There is no flow identified in the dorsalis pedis artery. LEFT Lower Extremity Inflow: Left common iliac artery stent is widely patent. Left iliac arteries are heavily calcified. There is flow in the left internal iliac artery. Left external iliac artery is patent. Outflow: Early takeoff of the deep left femoral artery. Soft tissue changes in left groin compatible with recent arterial intervention. There is at least mild narrowing in the left common femoral artery. Mild disease in the proximal left SFA. Diffuse disease throughout the mid and distal SFA. The left SFA is patent. Left popliteal artery is patent. Runoff: 3 vessel runoff in the proximal calf. However, the anterior tibial artery and peroneal artery appear to occlude distally. The primary runoff is the posterior tibial artery. There is no flow in the dorsalis pedis artery. Veins: No obvious venous abnormality within the limitations of this arterial phase study. Review of the MIP images confirms the above findings. NON-VASCULAR Lower chest: Lung bases are clear.  No pleural effusions. Hepatobiliary: Normal appearance of the liver and gallbladder. Pancreas: Normal appearance of the pancreas without inflammation or duct dilatation. Spleen: Normal appearance of spleen without enlargement. Adrenals/Urinary Tract: Normal adrenal glands. Left renal cysts without hydronephrosis. Right renal cysts without hydronephrosis. Mild wall thickening of the urinary bladder may be related to the enlarged prostate gland. Stomach/Bowel: No acute bowel abnormality. No  evidence for obstruction. Haziness in the central abdominal mesentery is nonspecific. Lymphatic: No significant lymph node enlargement in the abdomen or pelvis. Small lymph nodes in the pelvis are nonspecific. Reproductive: Prostate is markedly enlarged measuring 5.8 x 5.5 x 6.9 cm. Prostate is pressing on the base of the urinary bladder. Other: No free fluid in the abdomen or pelvis. Surgical clips in the scrotum. Soft tissue edema in both groins compatible with recent vascular intervention. No evidence for a retroperitoneal hematoma. No evidence for pseudoaneurysm. Musculoskeletal: Vacuum discs in the lower lumbar spine. IMPRESSION: VASCULAR Bilateral common iliac artery stents are patent. Bilateral outflow disease, right side greater than left. Superficial femoral arteries are diffusely diseased, particularly on the right side. Bilateral runoff disease. Primary runoff at both ankles are the posterior tibial arteries. NON-VASCULAR Prostate hypertrophy. Evidence for mild bladder wall thickening which may be secondary to the prostate hypertrophy. Electronically Signed   By: Scherrie Gerlach.D.  On: 12/08/2015 17:05    Procedures Procedures (including critical care time)  Medications Ordered in ED Medications  sodium chloride 0.9 % bolus 1,000 mL (0 mLs Intravenous Stopped 12/08/15 1719)  iopamidol (ISOVUE-370) 76 % injection (100 mLs  Contrast Given 12/08/15 1602)     Initial Impression / Assessment and Plan / ED Course  I have reviewed the triage vital signs and the nursing notes.  Pertinent labs & imaging results that were available during my care of the patient were reviewed by me and considered in my medical decision making (see chart for details).  Clinical Course  75 year old male with extensive past medical history including recent bifemoral stenting who presents with generalized fatigue, intermittent abdominal pain, and bilateral lower extremity subjective weakness and tightening sensation.  ABIs performed recently were unremarkable. Patient has been compliant with his Plavix. He did note to have mild hypoglycemia with onset of symptoms previously. Initial differential is very broad. Differential includes dehydration from recent surgery, constipation with intermittent abdominal pain, but must also consider aortic pathology given recent instrumentation as well as occlusion of femoral stents given tightening and legs, although his lower extremities warm, well-perfused to my exam at this time. Will obtain CT of the aorta with runoff's as well as broad screening labs. Patient also noted to be mildly hypotensive and dry, consistent with dehydration. Will give IV fluids and reassess. Last echo showed normal ejection fraction.  Patient noted to be mildly orthostatic. IV fluids are running. Otherwise, labs are overall very reassuring. CBC shows normal white blood cell count and hemoglobin. Lactic acid is normal. CMP is unremarkable. Troponin is negative.  CT shows patent stents and no aortic pathology. Patient's symptoms are resolved with IV fluids. He is ambulatory in ED without difficulty and is now asymptomatic. His blood sugars have remained stable. Given overall reassuring labs and normal vitals at this time, I suspect his symptoms were secondary to dehydration in setting of recent surgery as well as decreased by mouth intake due to constipation from analgesia. No signs of bowel obstruction. Will advise fluids, outpatient follow-up. Patient has had no chest pain and EKG is nonischemic and I do not suspect acute coronary syndrome. There are no signs of lower extremity ischemia or, location recent surgery. Return precautions were given.  Final Clinical Impressions(s) / ED Diagnoses   Final diagnoses:  Dehydration  Orthostasis    New Prescriptions Discharge Medication List as of 12/08/2015  6:17 PM       Duffy Bruce, MD 12/09/15 [phone]

## 2015-12-09 ENCOUNTER — Ambulatory Visit (INDEPENDENT_AMBULATORY_CARE_PROVIDER_SITE_OTHER): Payer: Medicare Other | Admitting: Cardiovascular Disease

## 2015-12-09 ENCOUNTER — Encounter: Payer: Self-pay | Admitting: Cardiovascular Disease

## 2015-12-09 VITALS — BP 115/50 | HR 50 | Ht 67.0 in | Wt 173.8 lb

## 2015-12-09 DIAGNOSIS — I739 Peripheral vascular disease, unspecified: Secondary | ICD-10-CM

## 2015-12-09 DIAGNOSIS — I10 Essential (primary) hypertension: Secondary | ICD-10-CM

## 2015-12-09 DIAGNOSIS — E785 Hyperlipidemia, unspecified: Secondary | ICD-10-CM

## 2015-12-09 NOTE — Patient Instructions (Signed)
Medication Instructions:  Your physician recommends that you continue on your current medications as directed. Please refer to the Current Medication list given to you today.  Please STOP Plavix (clopidogrel) 02/26/2016  Labwork: No new orders.   Testing/Procedures: Your physician has requested that you have an ankle brachial index (ABI) in 6 MONTHS. During this test an ultrasound and blood pressure cuff are used to evaluate the arteries that supply the arms and legs with blood. Allow thirty minutes for this exam. There are no restrictions or special instructions.  Your physician has requested that you have an aorto-iliac duplex in 6 MONTHS. During this test, an ultrasound is used to evaluate the aorta and iliac arteries. Do not eat after midnight the day before and avoid carbonated beverages  Follow-Up: Your physician wants you to follow-up in: 6 MONTHS with Dr Fletcher Anon.  You will receive a reminder letter in the mail two months in advance. If you don't receive a letter, please call our office to schedule the follow-up appointment.   Any Other Special Instructions Will Be Listed Below (If Applicable).     If you need a refill on your cardiac medications before your next appointment, please call your pharmacy.

## 2015-12-09 NOTE — Progress Notes (Signed)
Cardiology Office Note   Date:  12/09/2015   ID:  Quill, Rajaram [birth year], MRN [id number]  PCP:  Robert Bellow, MD  Cardiologist: [name]  Chief Complaint  Patient presents with  . Follow-up      History of Present Illness: Marcus Norton is a 75 y.o. male who presents for a follow-up visit regarding  claudication and PAD. He has known history of  paroxysmal supraventricular tachycardia, nonobstructive coronary artery disease, type 2 diabetes since 1990, hypertension and hyperlipidemia. He has known history of claudication . He did not tolerate cilostazol due to palpitations. I proceeded with angiography 2 weeks ago which showed severe bilateral common iliac artery stenosis, long calcified occlusion of the right SFA with reconstitution in the popliteal artery above the knee and diffuse moderate disease affecting the left SFA. I performed successful bilateral common iliac artery kissing stent placement. He has been doing well since then but she has not resume physical activities. He reports problems with labile blood sugar with an emergency room visit yesterday for fatigue and nonspecific complaint. He had CTA of the lower extremity that showed patent iliac stents.   Past Medical History:  Diagnosis Date  . 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 ASCVD (arteriosclerotic cardiovascular disease)    -luminal irregularities in 1998 and 2005; normal EF  . Benign prostatic hypertrophy    status post transurethral resection of the prostate  . Diabetes mellitus   . Dysphagia   . Erectile dysfunction   . Hyperlipidemia   . Hypertension   . Seasonal allergies   . Tobacco abuse    20 pack years; discontinued 1995    Past Surgical History:  Procedure Laterality Date  . APPENDECTOMY    . COLONOSCOPY  07/20/2011   Dr. Gala Romney: multiple hyperplastic polyps. Surveillance 2018  . COLONOSCOPY W/ BIOPSIES AND POLYPECTOMY  07/08/08, 06/2011   Dr Madolyn Frieze papilla,  diminutive rectal polyp ablated the, left-sided diverticula, piecemeal polypectomy of hepatic flexure adenomatous polyp, diminutive polyps near hepatic flexure ablated  . ESOPHAGOGASTRODUODENOSCOPY  2013   erosive reflux esophagitis, s/p empiric dilation. chronic duodenitis.   [name] NASAL SEPTUM SURGERY     Correction of deviated septum  . PERIPHERAL VASCULAR CATHETERIZATION N/A 11/26/2015   Procedure: Abdominal Aortogram w/Lower Extremity;  Surgeon: Wellington Hampshire, MD;  Location: [facility];  Service: Cardiovascular;  Laterality: N/A;  . TONSILLECTOMY    . TRANSURETHRAL RESECTION OF PROSTATE  2009     Current Outpatient Prescriptions  Medication Sig Dispense Refill  . acetaminophen (TYLENOL) 500 MG tablet Take 500 mg by mouth daily as needed for mild pain.    [name] aspirin EC 81 MG tablet Take 81 mg by mouth daily at 6 PM.     . atorvastatin (LIPITOR) 10 MG tablet Take 5 mg by mouth every other day.     . clopidogrel (PLAVIX) 75 MG tablet Take 1 tablet (75 mg total) by mouth daily. 30 tablet 3  . Cyanocobalamin (VITAMIN B 12 PO) Take 2,000 Units by mouth daily.     [name] LANTUS SOLOSTAR 100 UNIT/ML Solostar Pen Inject 14 Units into the skin 2 (two) times daily.  11  . lisinopril (PRINIVIL,ZESTRIL) 20 MG tablet Take 1 tablet (20 mg total) by mouth daily. 30 tablet 6  . metFORMIN (GLUCOPHAGE) 500 MG tablet Take 1,000 mg by mouth 2 (two) times daily with a meal.     . nitroGLYCERIN (NITROSTAT) 0.4 MG SL tablet Place 1 tablet (0.4  mg total) under the tongue every 5 (five) minutes x 3 doses as needed. (Patient taking differently: Place 0.4 mg under the tongue every 5 (five) minutes x 3 doses as needed for chest pain. ) 30 tablet 3  . tetrahydrozoline 0.05 % ophthalmic solution Place 2 drops into both eyes daily as needed (dry eyes).     No current facility-administered medications for this visit.     Allergies:   Oxycontin [oxycodone hcl]; Penicillins; Pioglitazone; Prednisone; and Simvastatin      Social History:  The patient  reports that he quit smoking about 22 years ago. His smoking use included Cigarettes. He started smoking about 50 years ago. He has a 20.00 pack-year smoking history. He has never used smokeless tobacco. He reports that he drinks alcohol. He reports that he does not use drugs.   Family History:  The patient's family history includes Colon cancer (age of onset: 72) in his sister; Ovarian cancer in his mother.    ROS:  Please see the history of present illness.   Otherwise, review of systems are positive for none.   All other systems are reviewed and negative.    PHYSICAL EXAM: VS:  BP (!) 115/50 (BP Location: Right Arm, Patient Position: Sitting, Cuff Size: Normal)   Pulse (!) 50   Ht 5\' 7"  (1.702 m)   Wt 173 lb 12.8 oz (78.8 kg)   SpO2 97%   BMI 27.22 kg/m  , BMI Body mass index is 27.22 kg/m. GEN: Well nourished, well developed, in no acute distress  HEENT: normal  Neck: no JVD, carotid bruits, or masses Cardiac: RRR; no  rubs, or gallops,no edema . 2/6 crescendo decrescendo systolic murmur in the aortic area which is mid peaking Respiratory:  clear to auscultation bilaterally, normal work of breathing GI: soft, nontender, nondistended, + BS MS: no deformity or atrophy  Skin: warm and dry, no rash Neuro:  Strength and sensation are intact Psych: euthymic mood, full affect Vascular: Femoral pulse: Normal bilaterally.  He has large bruising with no hematoma  EKG:  EKG is not ordered today.   Recent Labs: 12/08/2015: ALT 50; BUN 14; Creatinine, Ser 0.77; Hemoglobin 15.2; Platelets 355; Potassium 5.0; Sodium 135    Lipid Panel    Component Value Date/Time   CHOL 156 03/12/2008   TRIG 96 03/12/2008   HDL 46 03/12/2008   LDLCALC 91 03/12/2008      Wt Readings from Last 3 Encounters:  12/09/15 173 lb 12.8 oz (78.8 kg)  11/26/15 171 lb (77.6 kg)  11/18/15 175 lb 9.6 oz (79.7 kg)       No flowsheet data found.    ASSESSMENT AND  PLAN:  1.  Peripheral arterial disease:  Improvement in claudication after recent bilateral common iliac artery kissing stent placement. The patient has long occlusion of the right SFA for which I recommend medical therapy. He is to continue Plavix for a total of 3 months. Postprocedure vascular studies showed improvement in ABI bilaterally with patent iliac stents. Repeat studies in 6 months.  2. Cardiac murmur: This was due to aortic sclerosis without significant stenosis. Ejection fraction was normal.  3. Hyperlipidemia: Continue treatment with atorvastatin with a target LDL of less than 70.  4. Essential hypertension: Controlled on lisinopril.    Disposition:   FU with me in 6 month  Signed,  [name], MD  12/09/2015 9:55 AM    [place]

## 2015-12-15 LAB — BASIC METABOLIC PANEL
BUN: 26 mg/dL — AB (ref 4–21)
Creatinine: 0.8 mg/dL (ref 0.6–1.3)
GLUCOSE: 158 mg/dL
POTASSIUM: 4.5 mmol/L (ref 3.4–5.3)
Sodium: 140 mmol/L (ref 137–147)

## 2015-12-15 LAB — LIPID PANEL
CHOLESTEROL: 149 mg/dL (ref 0–200)
HDL: 43 mg/dL (ref 35–70)
LDL CALC: 90 mg/dL
Triglycerides: 73 mg/dL (ref 40–160)

## 2015-12-15 LAB — PSA: PSA: 1.2

## 2015-12-25 ENCOUNTER — Telehealth: Payer: Self-pay | Admitting: Cardiovascular Disease

## 2015-12-25 NOTE — Telephone Encounter (Signed)
I spoke with the pt's wife and she said the pt has not felt well since starting the plavix.  The pt complains of stomach pain, feels cold and fatigue.  The pt had PV intervention 11/26/15.  The pt has 2 pills left and would like to know if he can stop the medication or if there is an alternative to plavix that he can take.  The pt has taken pletal in the past and did not tolerate this medication (02/2014). I will forward this information to Dr Fletcher Anon to review and make further recommendations.

## 2015-12-25 NOTE — Telephone Encounter (Signed)
New message   Pt C/O medication issue:  1. Name of Medication: clopidogrel (PLAVIX) 75 MG tablet  2. How are you currently taking this medication (dosage and times per day)? 75 mg -once a day   3. Are you having a reaction (difficulty breathing--STAT)? Stomach pain, patient taken a stool softer, chills, no energy  4. What is your medication issue? Wants to discuss with nurse.

## 2015-12-26 NOTE — Telephone Encounter (Signed)
Follow up ° ° ° ° ° °Returning a call to the nurse °

## 2015-12-26 NOTE — Telephone Encounter (Signed)
Ok to stop Plavix.

## 2015-12-26 NOTE — Telephone Encounter (Signed)
Left message on machine for pt to contact the office.   

## 2015-12-26 NOTE — Telephone Encounter (Signed)
I spoke with the pt and made him aware that he can stop plavix.

## 2015-12-31 ENCOUNTER — Ambulatory Visit (INDEPENDENT_AMBULATORY_CARE_PROVIDER_SITE_OTHER): Payer: Medicare Other | Admitting: Internal Medicine

## 2015-12-31 ENCOUNTER — Encounter: Payer: Self-pay | Admitting: Internal Medicine

## 2015-12-31 VITALS — BP 158/86 | HR 90 | Ht 67.0 in | Wt 173.0 lb

## 2015-12-31 DIAGNOSIS — E1159 Type 2 diabetes mellitus with other circulatory complications: Secondary | ICD-10-CM | POA: Diagnosis not present

## 2015-12-31 DIAGNOSIS — E1165 Type 2 diabetes mellitus with hyperglycemia: Secondary | ICD-10-CM | POA: Diagnosis not present

## 2015-12-31 LAB — POCT GLYCOSYLATED HEMOGLOBIN (HGB A1C): HEMOGLOBIN A1C: 7.6

## 2015-12-31 MED ORDER — SITAGLIPTIN PHOSPHATE 100 MG PO TABS: 100.0000 mg | ORAL_TABLET | Freq: Every day | ORAL | 3 refills | Status: DC

## 2015-12-31 NOTE — Patient Instructions (Addendum)
Please continue: - Metformin 1000 mg 2x a day  Please move Lantus at bedtime and take 16 units.  Please try to restart Januvia 100 mg in am, before b'fast.  Please return in 1.5 months with your sugar log.   PATIENT INSTRUCTIONS FOR TYPE 2 DIABETES:  **Please join MyChart!** - see attached instructions about how to join if you have not done so already.  DIET AND EXERCISE Diet and exercise is an important part of diabetic treatment.  We recommended aerobic exercise in the form of brisk walking (working between 40-60% of maximal aerobic capacity, similar to brisk walking) for 150 minutes per week (such as 30 minutes five days per week) along with 3 times per week performing 'resistance' training (using various gauge rubber tubes with handles) 5-10 exercises involving the major muscle groups (upper body, lower body and core) performing 10-15 repetitions (or near fatigue) each exercise. Start at half the above goal but build slowly to reach the above goals. If limited by weight, joint pain, or disability, we recommend daily walking in a swimming pool with water up to waist to reduce pressure from joints while allow for adequate exercise.    BLOOD GLUCOSES Monitoring your blood glucoses is important for continued management of your diabetes. Please check your blood glucoses 2-4 times a day: fasting, before meals and at bedtime (you can rotate these measurements - e.g. one day check before the 3 meals, the next day check before 2 of the meals and before bedtime, etc.).   HYPOGLYCEMIA (low blood sugar) Hypoglycemia is usually a reaction to not eating, exercising, or taking too much insulin/ other diabetes drugs.  Symptoms include tremors, sweating, hunger, confusion, headache, etc. Treat IMMEDIATELY with 15 grams of Carbs: . 4 glucose tablets .  cup regular juice/soda . 2 tablespoons raisins . 4 teaspoons sugar . 1 tablespoon honey Recheck blood glucose in 15 mins and repeat above if still  symptomatic/blood glucose <100.  RECOMMENDATIONS TO REDUCE YOUR RISK OF DIABETIC COMPLICATIONS: * Take your prescribed MEDICATION(S) * Follow a DIABETIC diet: Complex carbs, fiber rich foods, (monounsaturated and polyunsaturated) fats * AVOID saturated/trans fats, high fat foods, >2,300 mg salt per day. * EXERCISE at least 5 times a week for 30 minutes or preferably daily.  * DO NOT SMOKE OR DRINK more than 1 drink a day. * Check your FEET every day. Do not wear tightfitting shoes. Contact us if you develop an ulcer * See your EYE doctor once a year or more if needed * Get a FLU shot once a year * Get a PNEUMONIA vaccine once before and once after age 67 years  GOALS:  * Your Hemoglobin A1c of <7%  * fasting sugars need to be <130 * after meals sugars need to be <180 (2h after you start eating) * Your Systolic BP should be [id number] or lower  * Your Diastolic BP should be 80 or lower  * Your HDL (Good Cholesterol) should be 40 or higher  * Your LDL (Bad Cholesterol) should be 100 or lower. * Your Triglycerides should be 150 or lower  * Your Urine microalbumin (kidney function) should be <30 * Your Body Mass Index should be 25 or lower    Please consider the following ways to cut down carbs and fat and increase fiber and micronutrients in your diet: - substitute whole grain for white bread or pasta - substitute brown rice for white rice - substitute 90-calorie flat bread pieces for slices of bread when possible -  substitute sweet potatoes or yams for white potatoes - substitute humus for margarine - substitute tofu for cheese when possible - substitute almond or rice milk for regular milk (would not drink soy milk daily due to concern for soy estrogen influence on breast cancer risk) - substitute dark chocolate for other sweets when possible - substitute water - can add lemon or orange slices for taste - for diet sodas (artificial sweeteners will trick your body that you can eat sweets  without getting calories and will lead you to overeating and weight gain in the long run) - do not skip breakfast or other meals (this will slow down the metabolism and will result in more weight gain over time)  - can try smoothies made from fruit and almond/rice milk in am instead of regular breakfast - can also try old-fashioned (not instant) oatmeal made with almond/rice milk in am - order the dressing on the side when eating salad at a restaurant (pour less than half of the dressing on the salad) - eat as little meat as possible - can try juicing, but should not forget that juicing will get rid of the fiber, so would alternate with eating raw veg./fruits or drinking smoothies - use as little oil as possible, even when using olive oil - can dress a salad with a mix of balsamic vinegar and lemon juice, for e.g. - use agave nectar, stevia sugar, or regular sugar rather than artificial sweateners - steam or broil/roast veggies  - snack on veggies/fruit/nuts (unsalted, preferably) when possible, rather than processed foods - reduce or eliminate aspartame in diet (it is in diet sodas, chewing gum, etc) Read the labels!  Try to read Dr. Janene Harvey book: "Program for Reversing Diabetes" for other ideas for healthy eating.

## 2015-12-31 NOTE — Progress Notes (Signed)
Patient ID: Marcus Norton, male   DOB: September 02, 1940, 75 y.o.   MRN: [id number]   HPI: Marcus Norton is a 75 y.o.-year-old male, referred by his PCP, Dr. Karie Kirks, for management of DM2, dx in 1990, insulin-dependent, uncontrolled, with complications (PAD - s/p stents, CAD, Paroxistic SV tachycardia).  He feels his diabetes worsened after his LE stents and after starting anticoagulation. He stopped the blood thinner at the direction of his cardiologist - feels better.  Last hemoglobin A1c was: 12/15/2015: HbA1c 7.3% Lab Results  Component Value Date   HGBA1C 9.4 04/23/2009   Pt is on a regimen of: - Metformin 1000 mg 2x a day, with meals - Lantus 8 units 2x a day He was on Actos, Glipizide, Januvia.  Pt checks his sugars 1-4x a day and they are: - am: 130-155 - 2h after b'fast: n/c - before lunch: 106 - 2h after lunch: n/c - before dinner: 132-150 - 2h after dinner: n/c - bedtime: 164 - nighttime: n/c Lowest sugar was 59; he has hypoglycemia awareness at 70.  Highest sugar was 279 (grapes).  Glucometer: Accu Chek Aviva Plus  Pt's meals are: - Breakfast: cereal + fruit + coffee - Lunch: Sandwich or veggie lunch - Dinner: Meat + veggies + bread - Snacks: 1-2 times a day, sometimes at bedtime  - no CKD, last BUN/creatinine:  Lab Results  Component Value Date   BUN 14 12/08/2015   BUN 20 11/30/2015   CREATININE 0.77 12/08/2015   CREATININE 0.76 11/30/2015  On Lisinopril. - last set of lipids: Lab Results  Component Value Date   CHOL 156 03/12/2008   HDL 46 03/12/2008   LDLCALC 91 03/12/2008   TRIG 96 03/12/2008  On Lipitor. - last eye exam was in 2016. No DR.  - no numbness and tingling in his feet.  Pt has FH of DM in brother, uncles, GM.  ROS: Constitutional: no weight gain/loss, no fatigue, + subjective hypothermia, + Nocturia, + poor sleep Eyes: no blurry vision, no xerophthalmia ENT: no sore throat, no nodules palpated in throat, + dysphagia/no  odynophagia, + hoarseness Cardiovascular: no CP/SOB/palpitations/leg swelling Respiratory: no cough/SOB Gastrointestinal: no N/V/D/+ C Musculoskeletal: no muscle/joint aches Skin: no rashes, + easy bruising Neurological: no tremors/numbness/tingling/dizziness Psychiatric: no depression/+ anxiety + Problems with erections  Past Medical History:  Diagnosis Date  . 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 ASCVD (arteriosclerotic cardiovascular disease)    -luminal irregularities in 1998 and 2005; normal EF  . Benign prostatic hypertrophy    status post transurethral resection of the prostate  . Diabetes mellitus   . Dysphagia   . Erectile dysfunction   . Hyperlipidemia   . Hypertension   . Seasonal allergies   . Tobacco abuse    20 pack years; discontinued 1995   Past Surgical History:  Procedure Laterality Date  . APPENDECTOMY    . COLONOSCOPY  07/20/2011   Dr. Gala Romney: multiple hyperplastic polyps. Surveillance 2018  . COLONOSCOPY W/ BIOPSIES AND POLYPECTOMY  07/08/08, 06/2011   Dr Madolyn Frieze papilla, diminutive rectal polyp ablated the, left-sided diverticula, piecemeal polypectomy of hepatic flexure adenomatous polyp, diminutive polyps near hepatic flexure ablated  . ESOPHAGOGASTRODUODENOSCOPY  2013   erosive reflux esophagitis, s/p empiric dilation. chronic duodenitis.   [name] NASAL SEPTUM SURGERY     Correction of deviated septum  . PERIPHERAL VASCULAR CATHETERIZATION N/A 11/26/2015   Procedure: Abdominal Aortogram w/Lower Extremity;  Surgeon: Wellington Hampshire, MD;  Location: [facility];  Service: Cardiovascular;  Laterality: N/A;  . TONSILLECTOMY    . TRANSURETHRAL RESECTION OF PROSTATE  2009   Social History   Social History  . Marital status: Married    Spouse name: N/A  . Number of children: 2   Occupational History  . retired-farmer, 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   Social History Main Topics  . Smoking status: Former Smoker     Packs/day: 1.00    Years: 20.00    Types: Cigarettes    Start date: 03/08/1965    Quit date: 07/07/1993  . Smokeless tobacco: Never Used  . Alcohol use 0.0 oz/week     Comment: beer or mixed drink occasionally  . Drug use: No   Current Outpatient Prescriptions on File Prior to Visit  Medication Sig Dispense Refill  . acetaminophen (TYLENOL) 500 MG tablet Take 500 mg by mouth daily as needed for mild pain.    [name] aspirin EC 81 MG tablet Take 81 mg by mouth daily at 6 PM.     . atorvastatin (LIPITOR) 10 MG tablet Take 5 mg by mouth every other day.     . Cyanocobalamin (VITAMIN B 12 PO) Take 2,000 Units by mouth daily.     [name] LANTUS SOLOSTAR 100 UNIT/ML Solostar Pen Inject 14 Units into the skin 2 (two) times daily.  11  . lisinopril (PRINIVIL,ZESTRIL) 20 MG tablet Take 1 tablet (20 mg total) by mouth daily. 30 tablet 6  . metFORMIN (GLUCOPHAGE) 500 MG tablet Take 1,000 mg by mouth 2 (two) times daily with a meal.     . nitroGLYCERIN (NITROSTAT) 0.4 MG SL tablet Place 1 tablet (0.4 mg total) under the tongue every 5 (five) minutes x 3 doses as needed. (Patient taking differently: Place 0.4 mg under the tongue every 5 (five) minutes x 3 doses as needed for chest pain. ) 30 tablet 3  . tetrahydrozoline 0.05 % ophthalmic solution Place 2 drops into both eyes daily as needed (dry eyes).     No current facility-administered medications on file prior to visit.    Allergies  Allergen Reactions  . Oxycontin [Oxycodone Hcl] Other (See Comments)    Malaise  . Penicillins Other (See Comments)    [place] Has patient had a PCN reaction causing immediate rash, facial/tongue/throat swelling, SOB or lightheadedness with hypotension: No Has patient had a PCN reaction causing severe rash involving mucus membranes or skin necrosis: No Has patient had a PCN reaction that required hospitalization No Has patient had a PCN reaction occurring within the last 10 years: No If all of the above answers are "NO",  then may proceed with Cephalosporin use.   . Pioglitazone Other (See Comments)    Made his chest hurt  . Prednisone Other (See Comments)    Reaction: muscle aches and soreness  . Simvastatin Other (See Comments)    Myalgias   Family History  Problem Relation Age of Onset  . Ovarian cancer Mother   . Colon cancer Sister 18    deceased from colon cancer    PE: BP (!) 158/86 (BP Location: Left Arm, Patient Position: Sitting, Cuff Size: Normal)   Pulse 90   Ht 5\' 7"  (1.702 m)   Wt 173 lb (78.5 kg)   SpO2 98%   BMI 27.10 kg/m  Wt Readings from Last 3 Encounters:  12/31/15 173 lb (78.5 kg)  12/09/15 173 lb 12.8 oz (78.8 kg)  11/26/15 171 lb (77.6 kg)   Constitutional: Slightly overweight,  in NAD Eyes: PERRLA, EOMI, no exophthalmos ENT: moist mucous membranes, no thyromegaly, no cervical lymphadenopathy Cardiovascular: RRR, No MRG Respiratory: CTA B Gastrointestinal: abdomen soft, NT, ND, BS+ Musculoskeletal: no deformities, strength intact in all 4 Skin: moist, warm, no rashes Neurological: no tremor with outstretched hands, DTR normal in all 4  ASSESSMENT: 1. DM2, insulin-dependent, uncontrolled, with complications - PAD - s/p stents - CAD - Paroxistic SV tachycardia  PLAN:  1. Patient with long-standing, uncontrolled diabetes, on oral + insulin regimen, which became insufficient. HbA1c today at 7.6%. Prev. lower . Sugars are not very high, per his log, but they are above target, especially in am and before dinner. We will move all Lantus at night and try to add Januvia, if covered by his insurance. - we discussed that a good HbA1c target for him due to age and comorbidities would be <7.5%. He is not far from this. - I suggested to:  Patient Instructions  Please continue: - Metformin 1000 mg 2x a day  Please move Lantus at bedtime and take 16 units.  Please try to restart Januvia 100 mg in am, before b'fast.  Please return in 1.5 months with your sugar log.   -  continue checking sugars at different times of the day - check 2 times a day, rotating checks - given sugar log and advised how to fill it and to bring it at next appt  - given foot care handout and explained the principles  - given instructions for hypoglycemia management "15-15 rule"  - advised for yearly eye exams  - Return to clinic in 1.5 mo with sugar log   Philemon Kingdom, MD PhD [facility] Endocrinology

## 2016-01-01 ENCOUNTER — Other Ambulatory Visit: Payer: Self-pay

## 2016-01-01 MED ORDER — SITAGLIPTIN PHOSPHATE 100 MG PO TABS: 100.0000 mg | ORAL_TABLET | Freq: Every day | ORAL | 3 refills | Status: DC

## 2016-02-17 ENCOUNTER — Encounter: Payer: Self-pay | Admitting: Internal Medicine

## 2016-02-17 ENCOUNTER — Ambulatory Visit (INDEPENDENT_AMBULATORY_CARE_PROVIDER_SITE_OTHER): Payer: Medicare Other | Admitting: Internal Medicine

## 2016-02-17 VITALS — BP 128/65 | HR 87 | Wt 174.0 lb

## 2016-02-17 DIAGNOSIS — E1165 Type 2 diabetes mellitus with hyperglycemia: Secondary | ICD-10-CM

## 2016-02-17 DIAGNOSIS — E1159 Type 2 diabetes mellitus with other circulatory complications: Secondary | ICD-10-CM

## 2016-02-17 DIAGNOSIS — J209 Acute bronchitis, unspecified: Secondary | ICD-10-CM | POA: Diagnosis not present

## 2016-02-17 MED ORDER — SITAGLIPTIN PHOSPHATE 100 MG PO TABS: 100.0000 mg | ORAL_TABLET | Freq: Every day | ORAL | 3 refills | Status: DC

## 2016-02-17 MED ORDER — LANTUS SOLOSTAR 100 UNIT/ML ~~LOC~~ SOPN: 18.0000 [IU] | PEN_INJECTOR | Freq: Every day | SUBCUTANEOUS | 11 refills | Status: DC

## 2016-02-17 MED ORDER — BENZONATATE 100 MG PO CAPS: 100.0000 mg | ORAL_CAPSULE | Freq: Two times a day (BID) | ORAL | 0 refills | Status: DC | PRN

## 2016-02-17 NOTE — Patient Instructions (Addendum)
Please increase Lantus to 18 units at bedtime. If sugars are still high in am (>140), please increase to 20 units.  Please continue: - Metformin 1000 mg 2x a day - Januvia 100 mg in am, before b'fast  Please bring me the results of your latest cholesterol level at next visit.  Please return in 2 months with your sugar log.

## 2016-02-17 NOTE — Progress Notes (Signed)
Patient ID: Marcus Norton, male   DOB: 25-Mar-1940, 75 y.o.   MRN: [id number]   HPI: Marcus Norton is a 75 y.o.-year-old male, returning for f/u for DM2, dx in 1990, insulin-dependent, uncontrolled, with complications (PAD - s/p stents, CAD, Paroxistic SV tachycardia). Last visit 1.5 mo ago.  He has bronchitis >> was on ABx until yesterday. Still coughing.  He was going to [facility] regularly now.   Last hemoglobin A1c was: Lab Results  Component Value Date   HGBA1C 7.6 12/31/2015   HGBA1C 9.4 04/23/2009  12/15/2015: HbA1c 7.3%  Pt is on a regimen of: - Metformin 1000 mg 2x a day - Januvia 100 mg in am, before b'fast - started 12/2015 - Lantus 16 units at bedtime  He was on Actos, Glipizide, Januvia.  Pt checks his sugars 1-2x a day and they are: - am: 130-155 >> 118-170, 202 - 2h after b'fast: n/c - before lunch: 106 >> 122, 165 - 2h after lunch: n/c - before dinner: 132-150 >> 150, 191 - 2h after dinner: n/c - bedtime: 164 - nighttime: n/c Lowest sugar was 59 >> 118; he has hypoglycemia awareness at 70.  Highest sugar was 279 (grapes) >> 202.  Glucometer: Accu Chek Aviva Plus  Pt's meals are: - Breakfast: cereal + fruit + coffee - Lunch: Sandwich or veggie lunch - Dinner: Meat + veggies + bread - Snacks: 1-2 times a day, sometimes at bedtime  - no CKD, last BUN/creatinine:  Lab Results  Component Value Date   BUN 14 12/08/2015   BUN 20 11/30/2015   CREATININE 0.77 12/08/2015   CREATININE 0.76 11/30/2015  On Lisinopril. - last set of lipids: 11/2015: will bring with him at next visit Lab Results  Component Value Date   CHOL 156 03/12/2008   HDL 46 03/12/2008   LDLCALC 91 03/12/2008   TRIG 96 03/12/2008  On Lipitor. - last eye exam was in 2016. No DR.  - no numbness and tingling in his feet.  Pt has FH of DM in brother, uncles, GM.  ROS: Constitutional: no weight gain/loss, no fatigue, + subjective hypothermia, + Nocturia Eyes: no blurry vision, no  xerophthalmia ENT: + sore throat, no nodules palpated in throat, no dysphagia/no odynophagia Cardiovascular: no CP/SOB/palpitations/leg swelling Respiratory: + cough/no SOB/+ wheezing Gastrointestinal: no N/V/+ D/no C Musculoskeletal: no muscle/joint aches Skin: no rashes Neurological: no tremors/numbness/tingling/dizziness  I reviewed pt's medications, allergies, PMH, social hx, family hx, and changes were documented in the history of present illness. Otherwise, unchanged from my initial visit note.  Past Medical History:  Diagnosis Date  . 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 ASCVD (arteriosclerotic cardiovascular disease)    -luminal irregularities in 1998 and 2005; normal EF  . Benign prostatic hypertrophy    status post transurethral resection of the prostate  . Diabetes mellitus   . Dysphagia   . Erectile dysfunction   . Hyperlipidemia   . Hypertension   . Seasonal allergies   . Tobacco abuse    20 pack years; discontinued 1995   Past Surgical History:  Procedure Laterality Date  . APPENDECTOMY    . COLONOSCOPY  07/20/2011   Dr. Gala Romney: multiple hyperplastic polyps. Surveillance 2018  . COLONOSCOPY W/ BIOPSIES AND POLYPECTOMY  07/08/08, 06/2011   Dr Madolyn Frieze papilla, diminutive rectal polyp ablated the, left-sided diverticula, piecemeal polypectomy of hepatic flexure adenomatous polyp, diminutive polyps near hepatic flexure ablated  . ESOPHAGOGASTRODUODENOSCOPY  2013   erosive reflux esophagitis, s/p empiric dilation. chronic duodenitis.   [name]  NASAL SEPTUM SURGERY     Correction of deviated septum  . PERIPHERAL VASCULAR CATHETERIZATION N/A 11/26/2015   Procedure: Abdominal Aortogram w/Lower Extremity;  Surgeon: Wellington Hampshire, MD;  Location: [place] [facility];  Service: Cardiovascular;  Laterality: N/A;  . TONSILLECTOMY    . TRANSURETHRAL RESECTION OF PROSTATE  2009   Social History   Social History  . Marital status: Married    Spouse name: N/A  . Number of  children: 2   Occupational History  . retired-farmer, 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   Social History Main Topics  . Smoking status: Former Smoker    Packs/day: 1.00    Years: 20.00    Types: Cigarettes    Start date: 03/08/1965    Quit date: 07/07/1993  . Smokeless tobacco: Never Used  . Alcohol use 0.0 oz/week     Comment: beer or mixed drink occasionally  . Drug use: No   Current Outpatient Prescriptions on File Prior to Visit  Medication Sig Dispense Refill  . ACCU-CHEK AVIVA PLUS test strip U BID  11  . acetaminophen (TYLENOL) 500 MG tablet Take 500 mg by mouth daily as needed for mild pain.    [name] aspirin EC 81 MG tablet Take 81 mg by mouth daily at 6 PM.     . atorvastatin (LIPITOR) 10 MG tablet Take 5 mg by mouth every other day.     . Cyanocobalamin (VITAMIN B 12 PO) Take 2,000 Units by mouth daily.     . diazepam (VALIUM) 5 MG tablet TK 1 T PO HS FOR SLP  5  . LANTUS SOLOSTAR 100 UNIT/ML Solostar Pen Inject 14 Units into the skin 2 (two) times daily.  11  . lisinopril (PRINIVIL,ZESTRIL) 20 MG tablet Take 1 tablet (20 mg total) by mouth daily. 30 tablet 6  . metFORMIN (GLUCOPHAGE) 500 MG tablet Take 1,000 mg by mouth 2 (two) times daily with a meal.     . nitroGLYCERIN (NITROSTAT) 0.4 MG SL tablet Place 1 tablet (0.4 mg total) under the tongue every 5 (five) minutes x 3 doses as needed. (Patient taking differently: Place 0.4 mg under the tongue every 5 (five) minutes x 3 doses as needed for chest pain. ) 30 tablet 3  . sitaGLIPtin (JANUVIA) 100 MG tablet Take 1 tablet (100 mg total) by mouth daily. 45 tablet 3  . tetrahydrozoline 0.05 % ophthalmic solution Place 2 drops into both eyes daily as needed (dry eyes).     No current facility-administered medications on file prior to visit.    Allergies  Allergen Reactions  . Oxycontin [Oxycodone Hcl] Other (See Comments)    Malaise  . Penicillins Other (See Comments)    [place] Has patient had a  PCN reaction causing immediate rash, facial/tongue/throat swelling, SOB or lightheadedness with hypotension: No Has patient had a PCN reaction causing severe rash involving mucus membranes or skin necrosis: No Has patient had a PCN reaction that required hospitalization No Has patient had a PCN reaction occurring within the last 10 years: No If all of the above answers are "NO", then may proceed with Cephalosporin use.   . Pioglitazone Other (See Comments)    Made his chest hurt  . Prednisone Other (See Comments)    Reaction: muscle aches and soreness  . Simvastatin Other (See Comments)    Myalgias   Family History  Problem Relation Age of Onset  . Ovarian cancer Mother   .  Colon cancer Sister 48    deceased from colon cancer    PE: BP 128/65   Pulse 87   Wt 174 lb (78.9 kg)   BMI 27.25 kg/m  Wt Readings from Last 3 Encounters:  02/17/16 174 lb (78.9 kg)  12/31/15 173 lb (78.5 kg)  12/09/15 173 lb 12.8 oz (78.8 kg)   Constitutional: Slightly overweight, in NAD Eyes: PERRLA, EOMI, no exophthalmos ENT: moist mucous membranes, no thyromegaly, no cervical lymphadenopathy Cardiovascular: RRR, No MRG Respiratory: + bilateral rhonchi, no wheezes Gastrointestinal: abdomen soft, NT, ND, BS+ Musculoskeletal: no deformities, strength intact in all 4 Skin: moist, warm, no rashes Neurological: no tremor with outstretched hands, DTR normal in all 4  ASSESSMENT: 1. DM2, insulin-dependent, uncontrolled, with complications - PAD - s/p stents - CAD - Paroxistic SV tachycardia  2. Bronchitis  PLAN:  1. Patient with long-standing, uncontrolled diabetes, on oral + insulin regimen, with higher sugars at this visit c/w last visit, despite adding Januvia. HbA1c at last check 7.6%. Will increase the Lantus by 2, then 4 units if needed. - we again discussed that a good HbA1c target for him due to age and comorbidities would be <7.5%. He is not far from this. - I suggested to:  Patient  Instructions  Please increase Lantus to 18 units at bedtime. If sugars are still high in am (>140), please increase to 20 units.  Please continue: - Metformin 1000 mg 2x a day - Januvia 100 mg in am, before b'fast  Please bring me the results of your latest cholesterol level at next visit.  Please return in 2 months with your sugar log.   - continue checking sugars at different times of the day - check 2 times a day, rotating checks - given more sugar logs - again advised for yearly eye exams  - Return to clinic in 2 mo with sugar log   2. Bronchitis - sent Ladona Ridgel to his pharmacy  Philemon Kingdom, MD PhD [facility]

## 2016-02-24 ENCOUNTER — Encounter: Payer: Self-pay | Admitting: Physician Assistant

## 2016-02-24 ENCOUNTER — Ambulatory Visit (INDEPENDENT_AMBULATORY_CARE_PROVIDER_SITE_OTHER): Payer: Medicare Other | Admitting: Physician Assistant

## 2016-02-24 VITALS — BP 112/60 | HR 87 | Temp 97.4°F | Resp 16 | Ht 67.0 in | Wt 175.0 lb

## 2016-02-24 DIAGNOSIS — J208 Acute bronchitis due to other specified organisms: Secondary | ICD-10-CM | POA: Diagnosis not present

## 2016-02-24 DIAGNOSIS — B9689 Other specified bacterial agents as the cause of diseases classified elsewhere: Secondary | ICD-10-CM

## 2016-02-24 MED ORDER — BENZONATATE 100 MG PO CAPS: 100.0000 mg | ORAL_CAPSULE | Freq: Three times a day (TID) | ORAL | 0 refills | Status: DC | PRN

## 2016-02-24 MED ORDER — DOXYCYCLINE HYCLATE 100 MG PO CAPS: 100.0000 mg | ORAL_CAPSULE | Freq: Two times a day (BID) | ORAL | 0 refills | Status: DC

## 2016-02-24 NOTE — Patient Instructions (Signed)
Please continue chronic medications as directed. I want you to schedule an appointment for your physical. We can do this as early as next week if you would like. We will update your blood work at that time.   Take antibiotic (Doxycycline) as directed.  Increase fluids.  Get plenty of rest. Use Tessalon as directed for cough. Continue albuterol inhaler. Take a daily probiotic (I recommend Align or Culturelle, but even Activia Yogurt may be beneficial).  A humidifier placed in the bedroom may offer some relief for a dry, scratchy throat of nasal irritation.  Read information below on acute bronchitis. Please call or return to clinic if symptoms are not improving.  Acute Bronchitis Bronchitis is when the airways that extend from the windpipe into the lungs get red, puffy, and painful (inflamed). Bronchitis often causes thick spit (mucus) to develop. This leads to a cough. A cough is the most common symptom of bronchitis. In acute bronchitis, the condition usually begins suddenly and goes away over time (usually in 2 weeks). Smoking, allergies, and asthma can make bronchitis worse. Repeated episodes of bronchitis may cause more lung problems.  HOME CARE  Rest.  Drink enough fluids to keep your pee (urine) clear or pale yellow (unless you need to limit fluids as told by your doctor).  Only take over-the-counter or prescription medicines as told by your doctor.  Avoid smoking and secondhand smoke. These can make bronchitis worse. If you are a smoker, think about using nicotine gum or skin patches. Quitting smoking will help your lungs heal faster.  Reduce the chance of getting bronchitis again by:  Washing your hands often.  Avoiding people with cold symptoms.  Trying not to touch your hands to your mouth, nose, or eyes.  Follow up with your doctor as told.  GET HELP IF: Your symptoms do not improve after 1 week of treatment. Symptoms include:  Cough.  Fever.  Coughing up thick  spit.  Body aches.  Chest congestion.  Chills.  Shortness of breath.  Sore throat.  GET HELP RIGHT AWAY IF:   You have an increased fever.  You have chills.  You have severe shortness of breath.  You have bloody thick spit (sputum).  You throw up (vomit) often.  You lose too much body fluid (dehydration).  You have a severe headache.  You faint.  MAKE SURE YOU:   Understand these instructions.  Will watch your condition.  Will get help right away if you are not doing well or get worse. Document Released: 07/28/2007 Document Revised: 10/11/2012 Document Reviewed: 08/01/2012 [facility] Patient Information 2015 [place], [place]. This information is not intended to replace advice given to you by your health care provider. Make sure you discuss any questions you have with your health care provider.

## 2016-02-24 NOTE — Progress Notes (Signed)
Pre visit review using our clinic review tool, if applicable. No additional management support is needed unless otherwise documented below in the visit note. 

## 2016-02-24 NOTE — Progress Notes (Signed)
Patient presents to clinic today today for acute visit as a new patient. Patient endorses a couple of weeks of cough with chest congestion. Cough is sometimes dry and sometimes productive of sputum. Denies fever, chills. Was seen at [facility] before christmas and given antibiotic with some relief of symptoms, but notes they have recurred.   Past Medical History:  Diagnosis Date  . 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 ASCVD (arteriosclerotic cardiovascular disease)    -luminal irregularities in 1998 and 2005; normal EF  . Benign prostatic hypertrophy    status post transurethral resection of the prostate  . Diabetes mellitus   . Dysphagia   . Erectile dysfunction   . Hyperlipidemia   . Hypertension   . Seasonal allergies   . Tobacco abuse    20 pack years; discontinued 1995    Past Surgical History:  Procedure Laterality Date  . APPENDECTOMY    . COLONOSCOPY  07/20/2011   Dr. Gala Romney: multiple hyperplastic polyps. Surveillance 2018  . COLONOSCOPY W/ BIOPSIES AND POLYPECTOMY  07/08/08, 06/2011   Dr Madolyn Frieze papilla, diminutive rectal polyp ablated the, left-sided diverticula, piecemeal polypectomy of hepatic flexure adenomatous polyp, diminutive polyps near hepatic flexure ablated  . ESOPHAGOGASTRODUODENOSCOPY  2013   erosive reflux esophagitis, s/p empiric dilation. chronic duodenitis.   [name] NASAL SEPTUM SURGERY     Correction of deviated septum  . PERIPHERAL VASCULAR CATHETERIZATION N/A 11/26/2015   Procedure: Abdominal Aortogram w/Lower Extremity;  Surgeon: Wellington Hampshire, MD;  Location: [place] [facility];  Service: Cardiovascular;  Laterality: N/A;  . TONSILLECTOMY    . TRANSURETHRAL RESECTION OF PROSTATE  2009    Current Outpatient Prescriptions on File Prior to Visit  Medication Sig Dispense Refill  . ACCU-CHEK AVIVA PLUS test strip U BID  11  . acetaminophen (TYLENOL) 500 MG tablet Take 500 mg by mouth daily as needed for mild pain.    [name] aspirin EC 81 MG tablet Take 81 mg by  mouth daily at 6 PM.     . atorvastatin (LIPITOR) 10 MG tablet Take 5 mg by mouth every other day.     . Cyanocobalamin (VITAMIN B 12 PO) Take 2,000 Units by mouth daily.     . diazepam (VALIUM) 5 MG tablet TK 1 T PO HS FOR SLP  5  . LANTUS SOLOSTAR 100 UNIT/ML Solostar Pen Inject 18 Units into the skin daily at 10 pm. 15 mL 11  . lisinopril (PRINIVIL,ZESTRIL) 20 MG tablet Take 1 tablet (20 mg total) by mouth daily. 30 tablet 6  . metFORMIN (GLUCOPHAGE) 500 MG tablet Take 1,000 mg by mouth 2 (two) times daily with a meal.     . nitroGLYCERIN (NITROSTAT) 0.4 MG SL tablet Place 1 tablet (0.4 mg total) under the tongue every 5 (five) minutes x 3 doses as needed. (Patient taking differently: Place 0.4 mg under the tongue every 5 (five) minutes x 3 doses as needed for chest pain. ) 30 tablet 3  . sitaGLIPtin (JANUVIA) 100 MG tablet Take 1 tablet (100 mg total) by mouth daily. 90 tablet 3  . tetrahydrozoline 0.05 % ophthalmic solution Place 2 drops into both eyes daily as needed (dry eyes).     No current facility-administered medications on file prior to visit.     Allergies  Allergen Reactions  . Plavix [Clopidogrel Bisulfate] Shortness Of Breath  . Oxycontin [Oxycodone Hcl] Other (See Comments)    Malaise  . Penicillins Other (See Comments)    [place] Has patient  had a PCN reaction causing immediate rash, facial/tongue/throat swelling, SOB or lightheadedness with hypotension: No Has patient had a PCN reaction causing severe rash involving mucus membranes or skin necrosis: No Has patient had a PCN reaction that required hospitalization No Has patient had a PCN reaction occurring within the last 10 years: No If all of the above answers are "NO", then may proceed with Cephalosporin use.   . Pioglitazone Other (See Comments)    Made his chest hurt  . Prednisone Other (See Comments)    Reaction: muscle aches and soreness  . Simvastatin Other (See Comments)    Myalgias    Family  History  Problem Relation Age of Onset  . Ovarian cancer Mother   . Colon cancer Sister 48    deceased from colon cancer    Social History   Social History  . Marital status: Married    Spouse name: N/A  . Number of children: 2  . Years of education: N/A   Occupational History  . retired-farmer, 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   Social History Main Topics  . Smoking status: Former Smoker    Packs/day: 1.00    Years: 20.00    Types: Cigarettes    Start date: 03/08/1965    Quit date: 07/07/1993  . Smokeless tobacco: Never Used  . Alcohol use 0.0 oz/week     Comment: beer or mixed drink occasionally  . Drug use: No  . Sexual activity: Yes   Other Topics Concern  . Not on file   Social History Narrative  . No narrative on file    Review of Systems  Constitutional: Negative for malaise/fatigue and weight loss.  HENT: Positive for congestion. Negative for hearing loss, nosebleeds, sinus pain and tinnitus.   Eyes: Negative for blurred vision and double vision.  Respiratory: Positive for cough and sputum production. Negative for hemoptysis, shortness of breath and wheezing.   Cardiovascular: Negative for chest pain and palpitations.  Musculoskeletal: Negative for myalgias.  Neurological: Negative for dizziness, loss of consciousness and headaches.  Psychiatric/Behavioral: Negative for depression.   BP 112/60   Pulse 87   Temp 97.4 F (36.3 C) (Oral)   Resp 16   Ht 5\' 7"  (1.702 m)   Wt 175 lb (79.4 kg)   SpO2 95%   BMI 27.41 kg/m   Physical Exam  Constitutional: He is oriented to person, place, and time and well-developed, well-nourished, and in no distress.  HENT:  Head: Normocephalic and atraumatic.  Right Ear: External ear normal.  Left Ear: External ear normal.  Nose: Nose normal.  Mouth/Throat: Oropharynx is clear and moist. No oropharyngeal exudate.  TM within normal limits bilaterally.   Eyes: Conjunctivae are normal.  Neck:  Neck supple.  Cardiovascular: Normal rate, regular rhythm, normal heart sounds and intact distal pulses.   Pulmonary/Chest: Effort normal and breath sounds normal. No respiratory distress. He has no wheezes. He has no rales. He exhibits no tenderness.  Neurological: He is alert and oriented to person, place, and time.  Skin: Skin is warm and dry. No rash noted.  Psychiatric: Affect normal.  Vitals reviewed.   Recent Results (from the past 2160 hour(s))  Protime-INR     Status: None   Collection Time: 12/08/15  3:35 PM  Result Value Ref Range   Prothrombin Time 13.4 11.4 - 15.2 seconds   INR 1.02   CK     Status: Abnormal   Collection Time: 12/08/15  3:35 PM  Result Value Ref Range   Total CK 48 (L) 49 - 397 U/L  I-Stat Troponin, ED (not at [facility])     Status: None   Collection Time: 12/08/15  4:00 PM  Result Value Ref Range   Troponin i, poc 0.00 0.00 - 0.08 ng/mL   Comment 3            Comment: Due to the release kinetics of cTnI, a negative result within the first hours of the onset of symptoms does not rule out myocardial infarction with certainty. If myocardial infarction is still suspected, repeat the test at appropriate intervals.   I-Stat CG4 Lactic Acid, ED     Status: None   Collection Time: 12/08/15  4:07 PM  Result Value Ref Range   Lactic Acid, Venous 1.80 0.5 - 1.9 mmol/L  Urinalysis, Routine w reflex microscopic     Status: None   Collection Time: 12/08/15  4:35 PM  Result Value Ref Range   Color, Urine YELLOW YELLOW   APPearance CLEAR CLEAR   Specific Gravity, Urine 1.028 1.005 - 1.030   pH 6.5 5.0 - 8.0   Glucose, UA NEGATIVE NEGATIVE mg/dL   Hgb urine dipstick NEGATIVE NEGATIVE   Bilirubin Urine NEGATIVE NEGATIVE   Ketones, ur NEGATIVE NEGATIVE mg/dL   Protein, ur NEGATIVE NEGATIVE mg/dL   Nitrite NEGATIVE NEGATIVE   Leukocytes, UA NEGATIVE NEGATIVE    Comment: MICROSCOPIC NOT DONE ON URINES WITH NEGATIVE PROTEIN, BLOOD, LEUKOCYTES, NITRITE, OR GLUCOSE  <1000 mg/dL.  Basic metabolic panel     Status: Abnormal   Collection Time: 12/15/15 12:00 AM  Result Value Ref Range   Glucose 158 mg/dL   BUN 26 (A) 4 - 21 mg/dL   Creatinine 0.8 0.6 - 1.3 mg/dL   Potassium 4.5 3.4 - 5.3 mmol/L   Sodium 140 137 - 147 mmol/L  Lipid panel     Status: None   Collection Time: 12/15/15 12:00 AM  Result Value Ref Range   Triglycerides 73 40 - 160 mg/dL   Cholesterol 149 0 - 200 mg/dL   HDL 43 35 - 70 mg/dL   LDL Cholesterol 90 mg/dL  PSA     Status: None   Collection Time: 12/15/15 12:00 AM  Result Value Ref Range   PSA 1.2   POC HgB A1c     Status: None   Collection Time: 12/31/15 11:34 AM  Result Value Ref Range   Hemoglobin A1C 7.6   Lipid panel     Status: None   Collection Time: 03/04/16 10:29 AM  Result Value Ref Range   Cholesterol 141 0 - 200 mg/dL    Comment: ATP III Classification       Desirable:  < 200 mg/dL               Borderline High:  200 - 239 mg/dL          High:  > = 240 mg/dL   Triglycerides 63.0 0.0 - 149.0 mg/dL    Comment: Normal:  <150 mg/dLBorderline High:  150 - 199 mg/dL   HDL 42.80 >39.00 mg/dL   VLDL 12.6 0.0 - 40.0 mg/dL   LDL Cholesterol 86 0 - 99 mg/dL   Total CHOL/HDL Ratio 3     Comment:                Men          Women1/2 Average Risk     3.4  3.3Average Risk          5.0          4.42X Average Risk          9.6          7.13X Average Risk          15.0          11.0                       NonHDL 98.10     Comment: NOTE:  Non-HDL goal should be 30 mg/dL higher than patient's LDL goal (i.e. LDL goal of < 70 mg/dL, would have non-HDL goal of < 100 mg/dL)  CBC with Differential/Platelet     Status: None   Collection Time: 03/04/16 10:29 AM  Result Value Ref Range   WBC 8.5 4.0 - 10.5 K/uL   RBC 4.33 4.22 - 5.81 Mil/uL   Hemoglobin 14.4 13.0 - 17.0 g/dL   HCT 42.2 39.0 - 52.0 %   MCV 97.4 78.0 - 100.0 fl   MCHC 34.1 30.0 - 36.0 g/dL   RDW 13.3 11.5 - 15.5 %   Platelets 290.0 150.0 - 400.0 K/uL    Neutrophils Relative % 71.0 43.0 - 77.0 %   Lymphocytes Relative 20.3 12.0 - 46.0 %   Monocytes Relative 4.6 3.0 - 12.0 %   Eosinophils Relative 3.6 0.0 - 5.0 %   Basophils Relative 0.5 0.0 - 3.0 %   Neutro Abs 6.0 1.4 - 7.7 K/uL   Lymphs Abs 1.7 0.7 - 4.0 K/uL   Monocytes Absolute 0.4 0.1 - 1.0 K/uL   Eosinophils Absolute 0.3 0.0 - 0.7 K/uL   Basophils Absolute 0.0 0.0 - 0.1 K/uL  Comprehensive metabolic panel     Status: Abnormal   Collection Time: 03/04/16 10:29 AM  Result Value Ref Range   Sodium 138 135 - 145 mEq/L   Potassium 4.6 3.5 - 5.1 mEq/L   Chloride 100 96 - 112 mEq/L   CO2 30 19 - 32 mEq/L   Glucose, Bld 183 (H) 70 - 99 mg/dL   BUN 24 (H) 6 - 23 mg/dL   Creatinine, Ser 0.77 0.40 - 1.50 mg/dL   Total Bilirubin 0.4 0.2 - 1.2 mg/dL   Alkaline Phosphatase 47 39 - 117 U/L   AST 19 0 - 37 U/L   ALT 31 0 - 53 U/L   Total Protein 7.0 6.0 - 8.3 g/dL   Albumin 4.4 3.5 - 5.2 g/dL   Calcium 9.5 8.4 - 10.5 mg/dL   GFR 104.13 >60.00 mL/min  TSH     Status: None   Collection Time: 03/04/16 10:29 AM  Result Value Ref Range   TSH 1.85 0.35 - 4.50 uIU/mL  Urinalysis, Routine w reflex microscopic     Status: Abnormal   Collection Time: 03/04/16 10:29 AM  Result Value Ref Range   Color, Urine YELLOW Yellow;Lt. Yellow   APPearance CLEAR Clear   Specific Gravity, Urine 1.015 1.000 - 1.030   pH 5.5 5.0 - 8.0   Total Protein, Urine NEGATIVE Negative   Urine Glucose >=1000 (A) Negative    Comment: Results faxed to site/floor on 03/04/2016 4:57 PM by Delorise Jackson.   Ketones, ur NEGATIVE Negative   Bilirubin Urine NEGATIVE Negative   Hgb urine dipstick NEGATIVE Negative   Urobilinogen, UA 0.2 0.0 - 1.0   Leukocytes, UA NEGATIVE Negative   Nitrite NEGATIVE Negative   WBC, UA none  seen 0-2/hpf   RBC / HPF none seen 0-2/hpf   Squamous Epithelial / LPF Rare(0-4/hpf) Rare(0-4/hpf)    Assessment/Plan: 1. Acute bacterial bronchitis Rx Doxycycline. Supportive measures and OTC  medications reviewed. FU if not resolving with medication. Appointment for CPE scheduled.    Leeanne Rio, PA-C

## 2016-03-04 ENCOUNTER — Ambulatory Visit (INDEPENDENT_AMBULATORY_CARE_PROVIDER_SITE_OTHER): Payer: Medicare Other | Admitting: Physician Assistant

## 2016-03-04 ENCOUNTER — Encounter: Payer: Self-pay | Admitting: Physician Assistant

## 2016-03-04 ENCOUNTER — Encounter: Payer: Self-pay | Admitting: Emergency Medicine

## 2016-03-04 VITALS — BP 130/60 | HR 82 | Temp 97.5°F | Resp 16 | Ht 67.0 in | Wt 176.0 lb

## 2016-03-04 DIAGNOSIS — E1159 Type 2 diabetes mellitus with other circulatory complications: Secondary | ICD-10-CM | POA: Diagnosis not present

## 2016-03-04 DIAGNOSIS — Z23 Encounter for immunization: Secondary | ICD-10-CM

## 2016-03-04 DIAGNOSIS — Z0000 Encounter for general adult medical examination without abnormal findings: Secondary | ICD-10-CM | POA: Diagnosis not present

## 2016-03-04 DIAGNOSIS — E1165 Type 2 diabetes mellitus with hyperglycemia: Secondary | ICD-10-CM

## 2016-03-04 DIAGNOSIS — I10 Essential (primary) hypertension: Secondary | ICD-10-CM | POA: Diagnosis not present

## 2016-03-04 DIAGNOSIS — F411 Generalized anxiety disorder: Secondary | ICD-10-CM | POA: Insufficient documentation

## 2016-03-04 DIAGNOSIS — I739 Peripheral vascular disease, unspecified: Secondary | ICD-10-CM | POA: Diagnosis not present

## 2016-03-04 DIAGNOSIS — E785 Hyperlipidemia, unspecified: Secondary | ICD-10-CM

## 2016-03-04 LAB — URINALYSIS, ROUTINE W REFLEX MICROSCOPIC
BILIRUBIN URINE: NEGATIVE
Hgb urine dipstick: NEGATIVE
KETONES UR: NEGATIVE
LEUKOCYTES UA: NEGATIVE
Nitrite: NEGATIVE
PH: 5.5 (ref 5.0–8.0)
RBC / HPF: NONE SEEN (ref 0–?)
SPECIFIC GRAVITY, URINE: 1.015 (ref 1.000–1.030)
Total Protein, Urine: NEGATIVE
UROBILINOGEN UA: 0.2 (ref 0.0–1.0)
Urine Glucose: >=1000 — AB
WBC, UA: NONE SEEN (ref 0–?)

## 2016-03-04 LAB — CBC WITH DIFFERENTIAL/PLATELET
BASOS ABS: 0 10*3/uL (ref 0.0–0.1)
Basophils Relative: 0.5 % (ref 0.0–3.0)
EOS PCT: 3.6 % (ref 0.0–5.0)
Eosinophils Absolute: 0.3 10*3/uL (ref 0.0–0.7)
HEMATOCRIT: 42.2 % (ref 39.0–52.0)
Hemoglobin: 14.4 g/dL (ref 13.0–17.0)
LYMPHS ABS: 1.7 10*3/uL (ref 0.7–4.0)
LYMPHS PCT: 20.3 % (ref 12.0–46.0)
MCHC: 34.1 g/dL (ref 30.0–36.0)
MCV: 97.4 fl (ref 78.0–100.0)
MONOS PCT: 4.6 % (ref 3.0–12.0)
Monocytes Absolute: 0.4 10*3/uL (ref 0.1–1.0)
NEUTROS PCT: 71 % (ref 43.0–77.0)
Neutro Abs: 6 10*3/uL (ref 1.4–7.7)
Platelets: 290 10*3/uL (ref 150.0–400.0)
RBC: 4.33 Mil/uL (ref 4.22–5.81)
RDW: 13.3 % (ref 11.5–15.5)
WBC: 8.5 10*3/uL (ref 4.0–10.5)

## 2016-03-04 LAB — COMPREHENSIVE METABOLIC PANEL
ALK PHOS: 47 U/L (ref 39–117)
ALT: 31 U/L (ref 0–53)
AST: 19 U/L (ref 0–37)
Albumin: 4.4 g/dL (ref 3.5–5.2)
BILIRUBIN TOTAL: 0.4 mg/dL (ref 0.2–1.2)
BUN: 24 mg/dL — ABNORMAL HIGH (ref 6–23)
CO2: 30 mEq/L (ref 19–32)
Calcium: 9.5 mg/dL (ref 8.4–10.5)
Chloride: 100 mEq/L (ref 96–112)
Creatinine, Ser: 0.77 mg/dL (ref 0.40–1.50)
GFR: 104.13 mL/min (ref >60.00)
GLUCOSE: 183 mg/dL — AB (ref 70–99)
POTASSIUM: 4.6 meq/L (ref 3.5–5.1)
Sodium: 138 mEq/L (ref 135–145)
TOTAL PROTEIN: 7 g/dL (ref 6.0–8.3)

## 2016-03-04 LAB — LIPID PANEL
CHOL/HDL RATIO: 3
Cholesterol: 141 mg/dL (ref 0–200)
HDL: 42.8 mg/dL (ref >39.00)
LDL Cholesterol: 86 mg/dL (ref 0–99)
NONHDL: 98.1
Triglycerides: 63 mg/dL (ref 0.0–149.0)
VLDL: 12.6 mg/dL (ref 0.0–40.0)

## 2016-03-04 LAB — TSH: TSH: 1.85 u[IU]/mL (ref 0.35–4.50)

## 2016-03-04 NOTE — Patient Instructions (Signed)
Please go to the lab for blood work. I will call you with your results.  Please continue medications as directed for now. We will alter based on labs results.  Please follow-up with Dr. Cruzita Lederer as scheduled for your diabetes. Schedule follow-up with your Urologist, Market [facility].   Follow-up with me will be based on results.   Preventive Care 77 Years and Older, Male Preventive care refers to lifestyle choices and visits with your health care provider that can promote health and wellness. What does preventive care include?  A yearly physical exam. This is also called an annual well check.  Dental exams once or twice a year.  Routine eye exams. Ask your health care provider how often you should have your eyes checked.  Personal lifestyle choices, including:  Daily care of your teeth and gums.  Regular physical activity.  Eating a healthy diet.  Avoiding tobacco and drug use.  Limiting alcohol use.  Practicing safe sex.  Taking low doses of aspirin every day.  Taking vitamin and mineral supplements as recommended by your health care provider. What happens during an annual well check? The services and screenings done by your health care provider during your annual well check will depend on your age, overall health, lifestyle risk factors, and family history of disease. Counseling  Your health care provider may ask you questions about your:  Alcohol use.  Tobacco use.  Drug use.  Emotional well-being.  Home and relationship well-being.  Sexual activity.  Eating habits.  History of falls.  Memory and ability to understand (cognition).  Work and work Statistician. Screening  You may have the following tests or measurements:  Height, weight, and BMI.  Blood pressure.  Lipid and cholesterol levels. These may be checked every 5 years, or more frequently if you are over 65 years old.  Skin check.  Lung cancer screening. You may have  this screening every year starting at age 45 if you have a 30-pack-year history of smoking and currently smoke or have quit within the past 15 years.  Fecal occult blood test (FOBT) of the stool. You may have this test every year starting at age 38.  Flexible sigmoidoscopy or colonoscopy. You may have a sigmoidoscopy every 5 years or a colonoscopy every 10 years starting at age 77.  Prostate cancer screening. Recommendations will vary depending on your family history and other risks.  Hepatitis C blood test.  Hepatitis B blood test.  Sexually transmitted disease (STD) testing.  Diabetes screening. This is done by checking your blood sugar (glucose) after you have not eaten for a while (fasting). You may have this done every 1-3 years.  Abdominal aortic aneurysm (AAA) screening. You may need this if you are a current or former smoker.  Osteoporosis. You may be screened starting at age 77 if you are at high risk. Talk with your health care provider about your test results, treatment options, and if necessary, the need for more tests. Vaccines  Your health care provider may recommend certain vaccines, such as:  Influenza vaccine. This is recommended every year.  Tetanus, diphtheria, and acellular pertussis (Tdap, Td) vaccine. You may need a Td booster every 10 years.  Varicella vaccine. You may need this if you have not been vaccinated.  Zoster vaccine. You may need this after age 35.  Measles, mumps, and rubella (MMR) vaccine. You may need at least one dose of MMR if you were born in 1957 or later. You may also need  a second dose.  Pneumococcal 13-valent conjugate (PCV13) vaccine. One dose is recommended after age 77.  Pneumococcal polysaccharide (PPSV23) vaccine. One dose is recommended after age 77.  Meningococcal vaccine. You may need this if you have certain conditions.  Hepatitis A vaccine. You may need this if you have certain conditions or if you travel or work in places  where you may be exposed to hepatitis A.  Hepatitis B vaccine. You may need this if you have certain conditions or if you travel or work in places where you may be exposed to hepatitis B.  Haemophilus influenzae type b (Hib) vaccine. You may need this if you have certain risk factors. Talk to your health care provider about which screenings and vaccines you need and how often you need them. This information is not intended to replace advice given to you by your health care provider. Make sure you discuss any questions you have with your health care provider. Document Released: 03/07/2015 Document Revised: 10/29/2015 Document Reviewed: 12/10/2014 Elsevier Interactive Patient Education  2017 [employer].    Diabetes and Foot Care Diabetes may cause you to have problems because of poor blood supply (circulation) to your feet and legs. This may cause the skin on your feet to become thinner, break easier, and heal more slowly. Your skin may become dry, and the skin may peel and crack. You may also have nerve damage in your legs and feet causing decreased feeling in them. You may not notice minor injuries to your feet that could lead to infections or more serious problems. Taking care of your feet is one of the most important things you can do for yourself. Follow these instructions at home:  Wear shoes at all times, even in the house. Do not go barefoot. Bare feet are easily injured.  Check your feet daily for blisters, cuts, and redness. If you cannot see the bottom of your feet, use a mirror or ask someone for help.  Wash your feet with warm water (do not use hot water) and mild soap. Then pat your feet and the areas between your toes until they are completely dry. Do not soak your feet as this can dry your skin.  Apply a moisturizing lotion or petroleum jelly (that does not contain alcohol and is unscented) to the skin on your feet and to dry, brittle toenails. Do not apply lotion between your  toes.  Trim your toenails straight across. Do not dig under them or around the cuticle. File the edges of your nails with an emery board or nail file.  Do not cut corns or calluses or try to remove them with medicine.  Wear clean socks or stockings every day. Make sure they are not too tight. Do not wear knee-high stockings since they may decrease blood flow to your legs.  Wear shoes that fit properly and have enough cushioning. To break in new shoes, wear them for just a few hours a day. This prevents you from injuring your feet. Always look in your shoes before you put them on to be sure there are no objects inside.  Do not cross your legs. This may decrease the blood flow to your feet.  If you find a minor scrape, cut, or break in the skin on your feet, keep it and the skin around it clean and dry. These areas may be cleansed with mild soap and water. Do not cleanse the area with peroxide, alcohol, or iodine.  When you remove an adhesive  bandage, be sure not to damage the skin around it.  If you have a wound, look at it several times a day to make sure it is healing.  Do not use heating pads or hot water bottles. They may burn your skin. If you have lost feeling in your feet or legs, you may not know it is happening until it is too late.  Make sure your health care provider performs a complete foot exam at least annually or more often if you have foot problems. Report any cuts, sores, or bruises to your health care provider immediately. Contact a health care provider if:  You have an injury that is not healing.  You have cuts or breaks in the skin.  You have an ingrown nail.  You notice redness on your legs or feet.  You feel burning or tingling in your legs or feet.  You have pain or cramps in your legs and feet.  Your legs or feet are numb.  Your feet always feel cold. Get help right away if:  There is increasing redness, swelling, or pain in or around a wound.  There  is a red line that goes up your leg.  Pus is coming from a wound.  You develop a fever or as directed by your health care provider.  You notice a bad smell coming from an ulcer or wound. This information is not intended to replace advice given to you by your health care provider. Make sure you discuss any questions you have with your health care provider. Document Released: 02/06/2000 Document Revised: 07/17/2015 Document Reviewed: 07/18/2012 Elsevier Interactive Patient Education  2017 [employer].

## 2016-03-04 NOTE — Progress Notes (Signed)
Pre visit review using our clinic review tool, if applicable. No additional management support is needed unless otherwise documented below in the visit note. 

## 2016-03-04 NOTE — Progress Notes (Signed)
Subjective:   Marcus Norton is a 76 y.o. male who presents for Medicare Annual/Subsequent preventive examination. Patient also entitled to a complete physical giving he has [place]. Body mass index is 27.57 kg/m. Endorses well-balanced diet overall. No regular exercise regimen but tries to stay active ata home.   Patient endorses issues with sleep at night. Is having a hard time falling asleep but once asleep is sleeping for about 6-7 hours since he has no regular schedule.   Chronic Medical Issues: Diabetes Mellitus II -- Followed by Endocrinology (Dr. Cruzita Lederer). Currently on regimen of Metformin 1000 mg BID, Januvia 100 mg QD and Lantus 12 units PM.. Endorses taking medications as directed.  Endorses fasting sugars averaging 120-135. Has eye exam scheduled. Is overdue for foot examination. Denies concerns. Denies history of neuropathy or CKD.  Patient is on ACEI.   Hypertension -- Is currently on Lisinopril 20 mg daily. Is taking daily as directed. Patient denies chest pain, palpitations, lightheadedness, dizziness, vision changes or frequent headaches. Patient with history of arteriosclerotic cardiovascular disease. Denies hx of stroke or heart attack  BP Readings from Last 3 Encounters:  03/04/16 130/60  02/24/16 112/60  02/17/16 128/65   Hyperlipidemia -- Is taking Lipitor 5 mg QOD at present. Is also taking 81 mg ASA daily. Is due for repeat lipids. Is fasting. Patient with history of PAD with claudication resolved s/p surgical intervention. Followed by Cardiology Shellia Carwin)  BPH -- Is followed by Urology ([place]). S/p TURP in 2009. Denies residual symptoms. PSA monitored by specialist.   Health Maintenance: Colon Cancer Screening  -- Followed by GI. Endorses last colonoscopy a few years ago. Immunizations -- Flu, Tetanus, Zostavax and Pneumonia series complete.  Prostate Cancer Screening -- Followed by Urology - [place].    Review of Systems:  Review of Systems    Constitutional: Negative for fever and weight loss.  HENT: Negative for ear discharge, ear pain, hearing loss and tinnitus.   Eyes: Negative for blurred vision, double vision, photophobia and pain.  Respiratory: Negative for cough and shortness of breath.   Cardiovascular: Negative for chest pain and palpitations.  Gastrointestinal: Negative for abdominal pain, blood in stool, constipation, diarrhea, heartburn, melena, nausea and vomiting.  Genitourinary: Negative for dysuria, flank pain, frequency, hematuria and urgency.  Musculoskeletal: Negative for falls.  Neurological: Negative for dizziness, loss of consciousness and headaches.  Endo/Heme/Allergies: Negative for environmental allergies.  Psychiatric/Behavioral: Negative for depression, hallucinations, substance abuse and suicidal ideas. The patient is not nervous/anxious and does not have insomnia.    Cardiac Risk Factors include: diabetes mellitus, Hyperlipidemia, advanced Age, overweight and history of PAD     Objective:    Vitals: BP 130/60   Pulse 82   Temp 97.5 F (36.4 C) (Oral)   Resp 16   Ht 5\' 7"  (1.702 m)   Wt 176 lb (79.8 kg)   SpO2 96%   BMI 27.57 kg/m   Body mass index is 27.57 kg/m.   Physical Exam  Constitutional: He is oriented to person, place, and time and well-developed, well-nourished, and in no distress.  HENT:  Head: Normocephalic and atraumatic.  Right Ear: External ear normal.  Left Ear: External ear normal.  Nose: Nose normal.  Mouth/Throat: Oropharynx is clear and moist. No oropharyngeal exudate.  Eyes: Conjunctivae and EOM are normal. Pupils are equal, round, and reactive to light.  Neck: Neck supple. No thyromegaly present.  Cardiovascular: Normal rate, regular rhythm, normal heart sounds and intact distal pulses.   Pulmonary/Chest:  Effort normal and breath sounds normal. No respiratory distress. He has no wheezes. He has no rales. He exhibits no tenderness.  Abdominal: Soft. Bowel sounds  are normal. He exhibits no distension and no mass. There is no tenderness. There is no rebound and no guarding.  Genitourinary: Testes/scrotum normal.  Genitourinary Comments: Deferred to Urology  Lymphadenopathy:    He has no cervical adenopathy.  Neurological: He is alert and oriented to person, place, and time.  Skin: Skin is warm and dry. No rash noted.  Psychiatric: Affect normal.  Vitals reviewed.   Tobacco History  Smoking Status  . Former Smoker  . Packs/day: 1.00  . Years: 20.00  . Types: Cigarettes  . Start date: 03/08/1965  . Quit date: 07/07/1993  Smokeless Tobacco  . Never Used     Counseling given: Not Answered   Past Medical History:  Diagnosis Date  . 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 ASCVD (arteriosclerotic cardiovascular disease)    -luminal irregularities in 1998 and 2005; normal EF  . Benign prostatic hypertrophy    status post transurethral resection of the prostate  . Diabetes mellitus   . Dysphagia   . Erectile dysfunction   . Hyperlipidemia   . Hypertension   . Seasonal allergies   . Tobacco abuse    20 pack years; discontinued 1995   Past Surgical History:  Procedure Laterality Date  . APPENDECTOMY    . COLONOSCOPY  07/20/2011   Dr. Gala Romney: multiple hyperplastic polyps. Surveillance 2018  . COLONOSCOPY W/ BIOPSIES AND POLYPECTOMY  07/08/08, 06/2011   Dr Madolyn Frieze papilla, diminutive rectal polyp ablated the, left-sided diverticula, piecemeal polypectomy of hepatic flexure adenomatous polyp, diminutive polyps near hepatic flexure ablated  . ESOPHAGOGASTRODUODENOSCOPY  2013   erosive reflux esophagitis, s/p empiric dilation. chronic duodenitis.   [name] NASAL SEPTUM SURGERY     Correction of deviated septum  . PERIPHERAL VASCULAR CATHETERIZATION N/A 11/26/2015   Procedure: Abdominal Aortogram w/Lower Extremity;  Surgeon: Wellington Hampshire, MD;  Location: [facility];  Service: Cardiovascular;  Laterality: N/A;  . TONSILLECTOMY    .  TRANSURETHRAL RESECTION OF PROSTATE  2009   Family History  Problem Relation Age of Onset  . Ovarian cancer Mother   . Colon cancer Sister 68    deceased from colon cancer   History  Sexual Activity  . Sexual activity: Yes    Outpatient Encounter Prescriptions as of 03/04/2016  Medication Sig  . ACCU-CHEK AVIVA PLUS test strip U BID  . acetaminophen (TYLENOL) 500 MG tablet Take 500 mg by mouth daily as needed for mild pain.  [name] albuterol (PROVENTIL HFA;VENTOLIN HFA) 108 (90 Base) MCG/ACT inhaler 1-2 inhalations every 4-6 hours as needed for wheezing. Dispense spacer as needed.  [name] aspirin EC 81 MG tablet Take 81 mg by mouth daily at 6 PM.   . atorvastatin (LIPITOR) 10 MG tablet Take 5 mg by mouth every other day.   . Cyanocobalamin (VITAMIN B 12 PO) Take 2,000 Units by mouth daily.   . diazepam (VALIUM) 5 MG tablet TK 1 T PO HS FOR SLP  . LANTUS SOLOSTAR 100 UNIT/ML Solostar Pen Inject 18 Units into the skin daily at 10 pm.  . lisinopril (PRINIVIL,ZESTRIL) 20 MG tablet Take 1 tablet (20 mg total) by mouth daily.  [name] loratadine (CLARITIN) 10 MG tablet Take 10 mg by mouth daily.  . metFORMIN (GLUCOPHAGE) 500 MG tablet Take 1,000 mg by mouth 2 (two) times daily with a meal.   .  nitroGLYCERIN (NITROSTAT) 0.4 MG SL tablet Place 1 tablet (0.4 mg total) under the tongue every 5 (five) minutes x 3 doses as needed. (Patient taking differently: Place 0.4 mg under the tongue every 5 (five) minutes x 3 doses as needed for chest pain. )  . sitaGLIPtin (JANUVIA) 100 MG tablet Take 1 tablet (100 mg total) by mouth daily.  [name] tetrahydrozoline 0.05 % ophthalmic solution Place 2 drops into both eyes daily as needed (dry eyes).  . [DISCONTINUED] benzonatate (TESSALON) 100 MG capsule Take 1 capsule (100 mg total) by mouth 3 (three) times daily as needed.  . [DISCONTINUED] doxycycline (VIBRAMYCIN) 100 MG capsule Take 1 capsule (100 mg total) by mouth 2 (two) times daily.   No facility-administered encounter  medications on file as of 03/04/2016.     Activities of Daily Living In your present state of health, do you have any difficulty performing the following activities: 03/04/2016 03/04/2016  Hearing? - N  Vision? - Y  Difficulty concentrating or making decisions? - N  Walking or climbing stairs? - N  Dressing or bathing? - N  Doing errands, shopping? - N  Conservation officer, nature and eating ? N Y  Using the Toilet? - Y  In the past six months, have you accidently leaked urine? - N  Do you have problems with loss of bowel control? - N  Managing your Medications? N Y  Managing your Finances? - Y  Housekeeping or managing your Housekeeping? - Y  Some recent data might be hidden    Patient Care Team: [name], PA-C as PCP - General (Family Medicine) Orion Crook, MD (Inactive) (Urology) Daneil Dolin, MD (Gastroenterology) Herminio Commons, MD as Attending Physician (Cardiology) Philemon Kingdom, MD as Consulting Physician (Internal Medicine)  - Endocrinology  Assessment:    (1) Medicare Wellness, Subsequent (2) Physical Exam (3) Hypertension (4) HLD (5) PAD  Exercise Activities and Dietary recommendations Current Exercise Habits: Home exercise routine, Type of exercise: treadmill;strength training/weights, Time (Minutes): 45, Frequency (Times/Week): 3, Weekly Exercise (Minutes/Week): 135, Intensity: Mild  Goals    None     Fall Risk Fall Risk  03/04/2016 02/24/2016  Falls in the past year? No No   Depression Screen PHQ 2/9 Scores 03/04/2016 02/24/2016 02/24/2016  PHQ - 2 Score 0 0 0  PHQ- 9 Score - - 0   Cognitive Function  A/O x 3 Object recall passed. Can perform simple calculations without issue      Immunization History  Administered Date(s) Administered  . Influenza, High Dose Seasonal PF 11/03/2015  . Pneumococcal Conjugate-13 03/04/2016  . Td 03/11/2011   Screening Tests Health Maintenance  Topic Date Due  . FOOT EXAM  03/08/1950  . OPHTHALMOLOGY EXAM   03/08/1950  . DTaP/Tdap/Td (1 - Tdap) 03/12/2011  . HEMOGLOBIN A1C  06/29/2016  . PNA vac Low Risk Adult (2 of 2 - PPSV23) 03/04/2017  . TETANUS/TDAP  03/10/2021  . INFLUENZA VACCINE  Completed  . ZOSTAVAX  Addressed      Plan:     (1) Medicare Wellness   During the course of the visit the patient was educated and counseled about the following appropriate screening and preventive services:   Vaccines to include Pneumoccal, Influenza, Hepatitis B, Td, Zostavax, HCV  Electrocardiogram  Cardiovascular Disease  Colorectal cancer screening  Diabetes screening  Prostate Cancer Screening  Glaucoma screening  Nutrition counseling   Smoking cessation counseling  Poorly controlled type 2 diabetes mellitus with circulatory disorder ([place]) Followed by  Dr. Cruzita Lederer. Continue management per Endo.  BP stable. Tolerating ACE well. Has eye examination scheduled. Foot examination completed today -- no decreased sensation or pulses on exam. Nails are thick. Discussed proper diabetic nail care.   PAD (peripheral artery disease) Followed by specialist. Doing well. Continue management per Cardiology.  Hypertension BP stable today. Continue current regimen. Will check labs.   Hyperlipidemia Will obtain fasting lipids and LFTs. Continue statin and ASA daily. Diet and exercise recommendations given.   Patient Instructions (the written plan) was given to the patient.    [name] [place], [place]  03/07/2016

## 2016-03-07 NOTE — Assessment & Plan Note (Signed)
BP stable today. Continue current regimen. Will check labs.

## 2016-03-07 NOTE — Assessment & Plan Note (Signed)
Followed by specialist. Doing well. Continue management per Cardiology.

## 2016-03-07 NOTE — Assessment & Plan Note (Signed)
Will obtain fasting lipids and LFTs. Continue statin and ASA daily. Diet and exercise recommendations given.

## 2016-03-07 NOTE — Assessment & Plan Note (Signed)
Followed by Dr. Cruzita Lederer. Continue management per Endo.  BP stable. Tolerating ACE well. Has eye examination scheduled. Foot examination completed today -- no decreased sensation or pulses on exam. Nails are thick. Discussed proper diabetic nail care.

## 2016-04-16 ENCOUNTER — Ambulatory Visit (INDEPENDENT_AMBULATORY_CARE_PROVIDER_SITE_OTHER): Payer: Medicare Other | Admitting: Internal Medicine

## 2016-04-16 ENCOUNTER — Encounter: Payer: Self-pay | Admitting: Internal Medicine

## 2016-04-16 VITALS — BP 150/74 | HR 80 | Ht 66.5 in | Wt 175.0 lb

## 2016-04-16 DIAGNOSIS — E1165 Type 2 diabetes mellitus with hyperglycemia: Secondary | ICD-10-CM

## 2016-04-16 DIAGNOSIS — E1159 Type 2 diabetes mellitus with other circulatory complications: Secondary | ICD-10-CM | POA: Diagnosis not present

## 2016-04-16 LAB — POCT GLYCOSYLATED HEMOGLOBIN (HGB A1C): Hemoglobin A1C: 7.5

## 2016-04-16 MED ORDER — METFORMIN HCL 1000 MG PO TABS: 1000.0000 mg | ORAL_TABLET | Freq: Two times a day (BID) | ORAL | 3 refills | Status: DC

## 2016-04-16 NOTE — Patient Instructions (Signed)
Please continue:  - Lantus 18 units at bedtime - Metformin 1000 mg 2x a day - Januvia 100 mg in am, before b'fast  Please return in 3 months with your sugar log.

## 2016-04-16 NOTE — Progress Notes (Signed)
Patient ID: JALIJAH KOOPMANN, male   DOB: [birth year], 76 y.o.   MRN: [id number]   HPI: GARFIELD PAUTSCH is a 76 y.o.-year-old male, returning for f/u for DM2, dx in 1990, insulin-dependent, uncontrolled, with complications (PAD - s/p stents, CAD, Paroxistic SV tachycardia). Last visit 2 mo ago.  Last hemoglobin A1c was: Lab Results  Component Value Date   HGBA1C 7.6 12/31/2015   HGBA1C 9.4 04/23/2009  12/15/2015: HbA1c 7.3%  Pt is on a regimen of: - Metformin 1000 mg 2x a day - Januvia 100 mg in am, before b'fast - started 12/2015 - Lantus 16 >> 18 units at bedtime  He was on Actos, Glipizide.  Pt checks his sugars 1-2x a day and they are: - am: 130-155 >> 118-170, 202 >> 120-150, 165, 170 (wakes up and eats: PB + bread) - does not check sugars then - sometimes she does not know he eats - 2h after b'fast: n/c - before lunch: 106 >> 122, 165 >> n/c - 2h after lunch: n/c - before dinner: 132-150 >> 150, 191 >> n/c - 2h after dinner: n/c - bedtime: 164 - nighttime: n/c Lowest sugar was 59 >> 118 >> 115 x1; he has hypoglycemia awareness at 70.  Highest sugar was 279 (grapes) >> 202 >> 175.  Glucometer: Accu Chek Aviva Plus  Pt's meals are: - Breakfast: cereal + fruit + coffee - Lunch: Sandwich or veggie lunch - Dinner: Meat + veggies + bread - Snacks: 1-2 times a day, sometimes at bedtime  Exercises regularly at the [facility] - qod.  - no CKD, last BUN/creatinine:  Lab Results  Component Value Date   BUN 24 (H) 03/04/2016   BUN 26 (A) 12/15/2015   CREATININE 0.77 03/04/2016   CREATININE 0.8 12/15/2015  On Lisinopril. - last set of lipids: Lab Results  Component Value Date   CHOL 141 03/04/2016   HDL 42.80 03/04/2016   LDLCALC 86 03/04/2016   TRIG 63.0 03/04/2016   CHOLHDL 3 03/04/2016  On Lipitor. - last eye exam was in 2016. No DR.  - no numbness and tingling in his feet.  ROS: Constitutional: no weight gain/loss, no fatigue, no subjective hypothermia, +  Nocturia Eyes: no blurry vision, no xerophthalmia ENT: no sore throat, no nodules palpated in throat, no dysphagia/no odynophagia Cardiovascular: + CP (hiatal hernia) - better with Tums/no SOB/palpitations/leg swelling Respiratory: no cough/no SOB/wheezing Gastrointestinal: no N/V/D/C Musculoskeletal: no muscle/joint aches Skin: no rashes Neurological: no tremors/numbness/tingling/dizziness  I reviewed pt's medications, allergies, PMH, social hx, family hx, and changes were documented in the history of present illness. Otherwise, unchanged from my initial visit note.  Past Medical History:  Diagnosis Date  . 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 ASCVD (arteriosclerotic cardiovascular disease)    -luminal irregularities in 1998 and 2005; normal EF  . Benign prostatic hypertrophy    status post transurethral resection of the prostate  . Diabetes mellitus   . Dysphagia   . Erectile dysfunction   . Hyperlipidemia   . Hypertension   . Seasonal allergies   . Tobacco abuse    20 pack years; discontinued 1995   Past Surgical History:  Procedure Laterality Date  . APPENDECTOMY    . COLONOSCOPY  07/20/2011   Dr. Gala Romney: multiple hyperplastic polyps. Surveillance 2018  . COLONOSCOPY W/ BIOPSIES AND POLYPECTOMY  07/08/08, 06/2011   Dr Madolyn Frieze papilla, diminutive rectal polyp ablated the, left-sided diverticula, piecemeal polypectomy of hepatic flexure adenomatous polyp, diminutive polyps near hepatic flexure ablated  .  ESOPHAGOGASTRODUODENOSCOPY  2013   erosive reflux esophagitis, s/p empiric dilation. chronic duodenitis.   [name] NASAL SEPTUM SURGERY     Correction of deviated septum  . PERIPHERAL VASCULAR CATHETERIZATION N/A 11/26/2015   Procedure: Abdominal Aortogram w/Lower Extremity;  Surgeon: Wellington Hampshire, MD;  Location: [place] [facility];  Service: Cardiovascular;  Laterality: N/A;  . TONSILLECTOMY    . TRANSURETHRAL RESECTION OF PROSTATE  2009   Social History   Social History   . Marital status: Married    Spouse name: N/A  . Number of children: 2   Occupational History  . retired-farmer, 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     [employer]   Social History Main Topics  . Smoking status: Former Smoker    Packs/day: 1.00    Years: 20.00    Types: Cigarettes    Start date: 03/08/1965    Quit date: 07/07/1993  . Smokeless tobacco: Never Used  . Alcohol use 0.0 oz/week     Comment: beer or mixed drink occasionally  . Drug use: No   Current Outpatient Prescriptions on File Prior to Visit  Medication Sig Dispense Refill  . ACCU-CHEK AVIVA PLUS test strip U BID  11  . acetaminophen (TYLENOL) 500 MG tablet Take 500 mg by mouth daily as needed for mild pain.    [name] albuterol (PROVENTIL HFA;VENTOLIN HFA) 108 (90 Base) MCG/ACT inhaler 1-2 inhalations every 4-6 hours as needed for wheezing. Dispense spacer as needed.    [name] aspirin EC 81 MG tablet Take 81 mg by mouth daily at 6 PM.     . atorvastatin (LIPITOR) 10 MG tablet Take 5 mg by mouth every other day.     . Cyanocobalamin (VITAMIN B 12 PO) Take 2,000 Units by mouth daily.     . diazepam (VALIUM) 5 MG tablet TK 1 T PO HS FOR SLP  5  . LANTUS SOLOSTAR 100 UNIT/ML Solostar Pen Inject 18 Units into the skin daily at 10 pm. 15 mL 11  . lisinopril (PRINIVIL,ZESTRIL) 20 MG tablet Take 1 tablet (20 mg total) by mouth daily. 30 tablet 6  . loratadine (CLARITIN) 10 MG tablet Take 10 mg by mouth daily.    . metFORMIN (GLUCOPHAGE) 500 MG tablet Take 1,000 mg by mouth 2 (two) times daily with a meal.     . nitroGLYCERIN (NITROSTAT) 0.4 MG SL tablet Place 1 tablet (0.4 mg total) under the tongue every 5 (five) minutes x 3 doses as needed. (Patient taking differently: Place 0.4 mg under the tongue every 5 (five) minutes x 3 doses as needed for chest pain. ) 30 tablet 3  . sitaGLIPtin (JANUVIA) 100 MG tablet Take 1 tablet (100 mg total) by mouth daily. 90 tablet 3  . tetrahydrozoline 0.05 % ophthalmic solution Place 2 drops  into both eyes daily as needed (dry eyes).     No current facility-administered medications on file prior to visit.    Allergies  Allergen Reactions  . Plavix [Clopidogrel Bisulfate] Shortness Of Breath  . Oxycontin [Oxycodone Hcl] Other (See Comments)    Malaise  . Penicillins Other (See Comments)    [place] Has patient had a PCN reaction causing immediate rash, facial/tongue/throat swelling, SOB or lightheadedness with hypotension: No Has patient had a PCN reaction causing severe rash involving mucus membranes or skin necrosis: No Has patient had a PCN reaction that required hospitalization No Has patient had a PCN reaction occurring within the last 10 years: No If all of the  above answers are "NO", then may proceed with Cephalosporin use.   . Pioglitazone Other (See Comments)    Made his chest hurt  . Prednisone Other (See Comments)    Reaction: muscle aches and soreness  . Simvastatin Other (See Comments)    Myalgias   Family History  Problem Relation Age of Onset  . Ovarian cancer Mother   . Colon cancer Sister 16    deceased from colon cancer    PE: BP (!) 150/74 (BP Location: Left Arm, Patient Position: Sitting)   Pulse 80   Ht 5' 6.5" (1.689 m)   Wt 175 lb (79.4 kg)   SpO2 96%   BMI 27.82 kg/m  Wt Readings from Last 3 Encounters:  04/16/16 175 lb (79.4 kg)  03/04/16 176 lb (79.8 kg)  02/24/16 175 lb (79.4 kg)   Constitutional: Slightly overweight, in NAD Eyes: PERRLA, EOMI, no exophthalmos ENT: moist mucous membranes, no thyromegaly, no cervical lymphadenopathy Cardiovascular: RRR, No MRG Respiratory: CTA B, no wheezes Gastrointestinal: abdomen soft, NT, ND, BS+ Musculoskeletal: no deformities, strength intact in all 4 Skin: moist, warm, R rash - R side of nose Neurological: no tremor with outstretched hands, DTR normal in all 4  ASSESSMENT: 1. DM2, insulin-dependent, uncontrolled, with complications - PAD - s/p stents - CAD - Paroxistic SV  tachycardia  PLAN:  1. Patient with long-standing, uncontrolled diabetes, on oral + insulin regimen, with higher sugars at last visit, despite adding Januvia >> we increased the Lantus by 2 units. Sugars are a little better but he only checks in am >> advised to also check later. Given more CBG logs - HbA1c at last check 7.6%. Today: 7.5%. A good HbA1c target for him due to age and comorbidities would be <7.5%.  - I suggested to:  Patient Instructions  Please continue:  - Lantus 18 units at bedtime - Metformin 1000 mg 2x a day - Januvia 100 mg in am, before b'fast  Please return in 3 months with your sugar log.   - continue checking sugars at different times of the day - check 1-2 times a day, rotating checks - again advised for yearly eye exams >> he needs to call and schedule this - Return to clinic in 3 mo with sugar log   Philemon Kingdom, MD PhD [facility] Endocrinology

## 2016-04-16 NOTE — Addendum Note (Signed)
Addended by: Caprice Beaver T on: 04/16/2016 09:10 AM   Modules accepted: Orders

## 2016-04-30 ENCOUNTER — Encounter (HOSPITAL_COMMUNITY): Payer: Self-pay | Admitting: Emergency Medicine

## 2016-04-30 ENCOUNTER — Emergency Department (HOSPITAL_COMMUNITY)
Admission: EM | Admit: 2016-04-30 | Discharge: 2016-04-30 | Disposition: A | Discharge status: Home / Self Care | Payer: Medicare Other | Attending: Emergency Medicine | Admitting: Emergency Medicine

## 2016-04-30 ENCOUNTER — Emergency Department (HOSPITAL_COMMUNITY): Payer: Medicare Other

## 2016-04-30 DIAGNOSIS — I10 Essential (primary) hypertension: Secondary | ICD-10-CM | POA: Insufficient documentation

## 2016-04-30 DIAGNOSIS — Z79899 Other long term (current) drug therapy: Secondary | ICD-10-CM | POA: Insufficient documentation

## 2016-04-30 DIAGNOSIS — Z7982 Long term (current) use of aspirin: Secondary | ICD-10-CM | POA: Insufficient documentation

## 2016-04-30 DIAGNOSIS — R0789 Other chest pain: Secondary | ICD-10-CM | POA: Diagnosis not present

## 2016-04-30 DIAGNOSIS — Z87891 Personal history of nicotine dependence: Secondary | ICD-10-CM | POA: Diagnosis not present

## 2016-04-30 DIAGNOSIS — E119 Type 2 diabetes mellitus without complications: Secondary | ICD-10-CM | POA: Diagnosis not present

## 2016-04-30 DIAGNOSIS — Z794 Long term (current) use of insulin: Secondary | ICD-10-CM | POA: Insufficient documentation

## 2016-04-30 LAB — BASIC METABOLIC PANEL
ANION GAP: 9 (ref 5–15)
BUN: 22 mg/dL — ABNORMAL HIGH (ref 6–20)
CO2: 26 mmol/L (ref 22–32)
Calcium: 9.1 mg/dL (ref 8.9–10.3)
Chloride: 100 mmol/L — ABNORMAL LOW (ref 101–111)
Creatinine, Ser: 1.02 mg/dL (ref 0.61–1.24)
GFR calc Af Amer: >60 mL/min (ref >60)
GFR calc non Af Amer: >60 mL/min (ref >60)
GLUCOSE: 214 mg/dL — AB (ref 65–99)
Potassium: 4.6 mmol/L (ref 3.5–5.1)
Sodium: 135 mmol/L (ref 135–145)

## 2016-04-30 LAB — CBC
HEMATOCRIT: 40.1 % (ref 39.0–52.0)
HEMOGLOBIN: 13.4 g/dL (ref 13.0–17.0)
MCH: 32.3 pg (ref 26.0–34.0)
MCHC: 33.4 g/dL (ref 30.0–36.0)
MCV: 96.6 fL (ref 78.0–100.0)
Platelets: 246 10*3/uL (ref 150–400)
RBC: 4.15 MIL/uL — ABNORMAL LOW (ref 4.22–5.81)
RDW: 12.6 % (ref 11.5–15.5)
WBC: 9 10*3/uL (ref 4.0–10.5)

## 2016-04-30 LAB — I-STAT TROPONIN, ED: Troponin i, poc: 0 ng/mL (ref 0.00–0.08)

## 2016-04-30 LAB — D-DIMER, QUANTITATIVE: D-Dimer, Quant: <0.27 ug/mL-FEU (ref 0.00–0.50)

## 2016-04-30 NOTE — ED Provider Notes (Signed)
[place] DEPT Provider Note   CSN: [id number] Arrival date & time: 04/30/16  2007     History   Chief Complaint Chief Complaint  Patient presents with  . Chest Pain    HPI Marcus Norton is a 76 y.o. male.  Patient is a 76 year old male with a history of diabetes, hypertension, hyperlipidemia, hiatal hernia, CAD with luminal irregularities but no stent placement presenting today with intermittent chest discomfort for the last 2-3 weeks. Patient describes it as a sharp pain that feels cold on the right side of his chest that has been occurring intermittently. Over the last week he has gotten almost every day usually in the afternoon. It usually occurs at rest and he does not notice having the symptoms when he is at the [facility] walking on the treadmill or doing anything active. When he gets the pain he will occasionally feel short of breath but denies any nausea, vomiting, diaphoresis.  He does recall having bronchitis at the end of January with persistent coughing intermittently since that time. He denies any trauma to his chest. He does have a history of hiatal hernia and GERD and states that he's tried to take Tums and recently started taking omeprazole which has seemed to improve his symptoms some however tonight the pain radiated up the middle of his sternum which caused him to worry. He took 2 nitroglycerin and states the pain completely resolved. He cannot relate with the pain is caused by eating. No recent medication changes blood sugars have been controlled.    Chest Pain   Associated symptoms include cough.    Past Medical History:  Diagnosis Date  . 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 ASCVD (arteriosclerotic cardiovascular disease)    -luminal irregularities in 1998 and 2005; normal EF  . Benign prostatic hypertrophy    status post transurethral resection of the prostate  . Diabetes mellitus   . Dysphagia   . Erectile dysfunction   . Hyperlipidemia   . Hypertension    . Seasonal allergies   . Tobacco abuse    20 pack years; discontinued 1995    Patient Active Problem List   Diagnosis Date Noted  . Generalized anxiety disorder 03/04/2016  . PAD (peripheral artery disease) ([place]) 02/26/2014  . Arteriosclerotic cardiovascular disease -nonobstructive   . Poorly controlled type 2 diabetes mellitus with circulatory disorder ([place])   . Hypertension   . History of tobacco abuse   . Hyperlipidemia 07/11/2009  . 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 Abnormal liver function tests 07/11/2009    Past Surgical History:  Procedure Laterality Date  . APPENDECTOMY    . COLONOSCOPY  07/20/2011   Dr. Gala Romney: multiple hyperplastic polyps. Surveillance 2018  . COLONOSCOPY W/ BIOPSIES AND POLYPECTOMY  07/08/08, 06/2011   Dr Madolyn Frieze papilla, diminutive rectal polyp ablated the, left-sided diverticula, piecemeal polypectomy of hepatic flexure adenomatous polyp, diminutive polyps near hepatic flexure ablated  . ESOPHAGOGASTRODUODENOSCOPY  2013   erosive reflux esophagitis, s/p empiric dilation. chronic duodenitis.   [name] NASAL SEPTUM SURGERY     Correction of deviated septum  . PERIPHERAL VASCULAR CATHETERIZATION N/A 11/26/2015   Procedure: Abdominal Aortogram w/Lower Extremity;  Surgeon: Wellington Hampshire, MD;  Location: [place] [facility];  Service: Cardiovascular;  Laterality: N/A;  . TONSILLECTOMY    . TRANSURETHRAL RESECTION OF PROSTATE  2009       Home Medications    Prior to Admission medications   Medication Sig Start Date End Date Taking? Authorizing Provider  ACCU-CHEK  AVIVA PLUS test strip U BID 12/14/15   Historical Provider, MD  acetaminophen (TYLENOL) 500 MG tablet Take 500 mg by mouth daily as needed for mild pain.    Historical Provider, MD  albuterol (PROVENTIL HFA;VENTOLIN HFA) 108 (90 Base) MCG/ACT inhaler 1-2 inhalations every 4-6 hours as needed for wheezing. Dispense spacer as needed. 02/12/16 02/11/17  Historical Provider, MD  aspirin  EC 81 MG tablet Take 81 mg by mouth daily at 6 PM.     Historical Provider, MD  atorvastatin (LIPITOR) 10 MG tablet Take 5 mg by mouth every other day.  06/27/11   Historical Provider, MD  Cyanocobalamin (VITAMIN B 12 PO) Take 2,000 Units by mouth daily.     Historical Provider, MD  diazepam (VALIUM) 5 MG tablet TK 1 T PO HS FOR SLP 12/09/15   Historical Provider, MD  LANTUS SOLOSTAR 100 UNIT/ML Solostar Pen Inject 18 Units into the skin daily at 10 pm. 02/17/16   Philemon Kingdom, MD  lisinopril (PRINIVIL,ZESTRIL) 20 MG tablet Take 1 tablet (20 mg total) by mouth daily. 07/10/10   Yehuda Savannah, MD  loratadine (CLARITIN) 10 MG tablet Take 10 mg by mouth daily.    Historical Provider, MD  metFORMIN (GLUCOPHAGE) 1000 MG tablet Take 1 tablet (1,000 mg total) by mouth 2 (two) times daily with a meal. 04/16/16   Philemon Kingdom, MD  nitroGLYCERIN (NITROSTAT) 0.4 MG SL tablet Place 1 tablet (0.4 mg total) under the tongue every 5 (five) minutes x 3 doses as needed. Patient taking differently: Place 0.4 mg under the tongue every 5 (five) minutes x 3 doses as needed for chest pain.  01/12/12   Renella Cunas, MD  sitaGLIPtin (JANUVIA) 100 MG tablet Take 1 tablet (100 mg total) by mouth daily. 02/17/16   Philemon Kingdom, MD  tetrahydrozoline 0.05 % ophthalmic solution Place 2 drops into both eyes daily as needed (dry eyes).    Historical Provider, MD    Family History Family History  Problem Relation Age of Onset  . Ovarian cancer Mother   . Colon cancer Sister 58    deceased from colon cancer    Social History Social History  Substance Use Topics  . Smoking status: Former Smoker    Packs/day: 1.00    Years: 20.00    Types: Cigarettes    Start date: 03/08/1965    Quit date: 07/07/1993  . Smokeless tobacco: Never Used  . Alcohol use 0.0 oz/week     Comment: beer or mixed drink occasionally     Allergies   Plavix [clopidogrel bisulfate]; Oxycontin [oxycodone hcl]; Penicillins;  Pioglitazone; Prednisone; and Simvastatin   Review of Systems Review of Systems  Respiratory: Positive for cough.   Cardiovascular: Positive for chest pain.  All other systems reviewed and are negative.    Physical Exam Updated Vital Signs BP (!) 119/51   Pulse 85   Temp 97.6 F (36.4 C) (Oral)   Resp 16   SpO2 94%   Physical Exam  Constitutional: He is oriented to person, place, and time. He appears well-developed and well-nourished. No distress.  HENT:  Head: Normocephalic and atraumatic.  Mouth/Throat: Oropharynx is clear and moist.  Eyes: Conjunctivae and EOM are normal. Pupils are equal, round, and reactive to light.  Neck: Normal range of motion. Neck supple.  Cardiovascular: Normal rate, regular rhythm and intact distal pulses.   No murmur heard. Pulmonary/Chest: Effort normal and breath sounds normal. No respiratory distress. He has no wheezes. He has no  rales. He exhibits no tenderness.  Abdominal: Soft. He exhibits no distension. There is no tenderness. There is no rebound and no guarding.  Musculoskeletal: Normal range of motion. He exhibits no edema or tenderness.  Neurological: He is alert and oriented to person, place, and time.  Skin: Skin is warm and dry. No rash noted. No erythema.  Psychiatric: He has a normal mood and affect. His behavior is normal.  Nursing note and vitals reviewed.    ED Treatments / Results  Labs (all labs ordered are listed, but only abnormal results are displayed) Labs Reviewed  BASIC METABOLIC PANEL  CBC  D-DIMER, QUANTITATIVE (NOT AT [facility])  Randolm Idol, ED    EKG  EKG Interpretation  Date/Time:  Friday April 30 2016 20:22:44 EST Ventricular Rate:  87 PR Interval:    QRS Duration: 107 QT Interval:  380 QTC Calculation: 458 R Axis:   -46 Text Interpretation:  Sinus rhythm Incomplete RBBB and LAFB Probable anteroseptal infarct, old Baseline wander in lead(s) V3 No significant change since last tracing Confirmed by  [facility]  MD, Autym Siess ([id number]) on 04/30/2016 8:27:53 PM       Radiology Dg Chest 2 View  Result Date: 04/30/2016 CLINICAL DATA:  Right-sided chest pain EXAM: CHEST  2 VIEW COMPARISON:  12/08/2015 FINDINGS: Small calcific tendinitis right shoulder. No acute infiltrate or effusion. Normal heart size. Atherosclerosis of the aorta. No pneumothorax. Degenerative osteophytes of the spine. IMPRESSION: No radiographic evidence for acute cardiopulmonary abnormality. Electronically Signed   By: Donavan Foil M.D.   On: 04/30/2016 21:17    Procedures Procedures (including critical care time)  Medications Ordered in ED Medications - No data to display   Initial Impression / Assessment and Plan / ED Course  I have reviewed the triage vital signs and the nursing notes.  Pertinent labs & imaging results that were available during my care of the patient were reviewed by me and considered in my medical decision making (see chart for details).     Patient presenting with atypical chest pain today. Low suspicion that patient's symptoms are related to ACS. He does have an EKG that shows an incomplete right bundle branch block but no changes from his prior EKGs.  Patient denies any trauma but does recall having bronchitis at the end of January and has had persistent bouts of nonproductive cough. The pain is not exertional and does not always have associated symptoms. Occasionally he will feel short of breath but that is not constant. He does not think it is related to eating however when he took to [place] it did help yesterday. Patient had some nitroglycerin at home and try that tonight which resolved his pain. He denies any fever, leg swelling or abdominal pain. He is well-appearing on exam. Patient does have multiple cardiac risk factors heart score of 4-5.  Will do a chest x-ray to ensure no infiltrate, d-dimer to ensure no PE. Troponin pending. CBC, BMP pending.  10:35 PM Labs reassuring.  Imaging neg.  Pt  still feeling well.  Will continue prilosec and f/u with GI next week.  Final Clinical Impressions(s) / ED Diagnoses   Final diagnoses:  Atypical chest pain    New Prescriptions New Prescriptions   No medications on file     Blanchie Dessert, MD 04/30/16 [id number]

## 2016-04-30 NOTE — ED Triage Notes (Signed)
Pt presents to ED for assessment of right sided chest pain x 2 weeks, intermittent, with a "cold" feeling/.  Pt states pain was worse today than its been and it scared him.  Pt took ASA and 2 nitro before EMS arrival.

## 2016-04-30 NOTE — ED Notes (Signed)
MD at bedside updating patient.

## 2016-04-30 NOTE — ED Notes (Signed)
Patient able to ambulate independently  

## 2016-05-11 ENCOUNTER — Encounter: Payer: Self-pay | Admitting: Physician Assistant

## 2016-05-11 ENCOUNTER — Ambulatory Visit (INDEPENDENT_AMBULATORY_CARE_PROVIDER_SITE_OTHER): Payer: Medicare Other | Admitting: Physician Assistant

## 2016-05-11 ENCOUNTER — Ambulatory Visit: Payer: Medicare Other | Admitting: Gastroenterology

## 2016-05-11 ENCOUNTER — Ambulatory Visit: Payer: Medicare Other | Admitting: Internal Medicine

## 2016-05-11 VITALS — BP 130/68 | HR 76 | Temp 97.5°F | Resp 14 | Ht 67.0 in | Wt 175.0 lb

## 2016-05-11 DIAGNOSIS — K449 Diaphragmatic hernia without obstruction or gangrene: Secondary | ICD-10-CM

## 2016-05-11 DIAGNOSIS — K219 Gastro-esophageal reflux disease without esophagitis: Secondary | ICD-10-CM

## 2016-05-11 DIAGNOSIS — R0789 Other chest pain: Secondary | ICD-10-CM

## 2016-05-11 MED ORDER — PANTOPRAZOLE SODIUM 40 MG PO TBEC: 40.0000 mg | DELAYED_RELEASE_TABLET | Freq: Every day | ORAL | 0 refills | Status: DC

## 2016-05-11 NOTE — Progress Notes (Signed)
Pre visit review using our clinic review tool, if applicable. No additional management support is needed unless otherwise documented below in the visit note. 

## 2016-05-11 NOTE — Progress Notes (Signed)
Patient presents to clinic today for ER follow-up. Patient presented to ER on 04/30/2016 for intermittent chest/epigastric pains for 2-3 weeks described as sharp, episodic and worse in the evenings. Had been taking Omeprazole OTC with some improvement in symptoms. On day of ER visit, symptoms more significant. Patient endorses taking 2 nitroglycerin with some relief. Was previously on a prescription PPI prescribed by Gastroenterology but had stop some time ago. ER workup including normal d-dimer, troponin and CBC. CXR obtained and negative. 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 Patient stable for discharge. Was instructed to continue PPI as symptoms felt to be GERD related. FU was scheduled with his GI for today, 05/11/16. Patient states he was called yesterday and appointment was canceled and moved to April by the provider for unknown reasons.  Patient endorses chest discomfort 1-2 times per day, describes as R-sided, lasting about an hour.  Sometimes waking him up from sleep at night. Is described as a dull, cold pain. Denies diaphoresis, shortness of breath, palpitations. Notes lightheadedness and dizziness during these episode. Also noting heart burn with reflux, bloating and increased flatulence. Notes occasional constipation without tenesmus, melena or hematochezia. Is improving being on Prilosec for a week. Also takes tums during acute symptoms with some relief. Is scheduled for an appointment with his Cardiologist on Thursday.   Past Medical History:  Diagnosis Date  . 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 ASCVD (arteriosclerotic cardiovascular disease)    -luminal irregularities in 1998 and 2005; normal EF  . Benign prostatic hypertrophy    status post transurethral resection of the prostate  . Diabetes mellitus   . Dysphagia   . Erectile dysfunction   . Hyperlipidemia   . Hypertension   . Seasonal allergies   . Tobacco abuse     20 pack years; discontinued 1995    Current Outpatient Prescriptions on File Prior to Visit  Medication Sig Dispense Refill  . ACCU-CHEK AVIVA PLUS test strip U BID  11  . acetaminophen (TYLENOL) 500 MG tablet Take 500 mg by mouth daily as needed for mild pain.    [name] albuterol (PROVENTIL HFA;VENTOLIN HFA) 108 (90 Base) MCG/ACT inhaler 1-2 inhalations every 4-6 hours as needed for wheezing. Dispense spacer as needed.    [name] aspirin EC 81 MG tablet Take 81 mg by mouth daily at 6 PM.     . atorvastatin (LIPITOR) 10 MG tablet Take 5 mg by mouth every other day.     . Cyanocobalamin (VITAMIN B 12 PO) Take 2,000 Units by mouth daily.     [name] LANTUS SOLOSTAR 100 UNIT/ML Solostar Pen Inject 18 Units into the skin daily at 10 pm. 15 mL 11  . lisinopril (PRINIVIL,ZESTRIL) 20 MG tablet Take 1 tablet (20 mg total) by mouth daily. 30 tablet 6  . loratadine (CLARITIN) 10 MG tablet Take 10 mg by mouth daily.    . metFORMIN (GLUCOPHAGE) 1000 MG tablet Take 1 tablet (1,000 mg total) by mouth 2 (two) times daily with a meal. 180 tablet 3  . nitroGLYCERIN (NITROSTAT) 0.4 MG SL tablet Place 1 tablet (0.4 mg total) under the tongue every 5 (five) minutes x 3 doses as needed. (Patient taking differently: Place 0.4 mg under the tongue every 5 (five) minutes x 3 doses as needed for chest pain. ) 30 tablet 3  . omeprazole (PRILOSEC) 20 MG capsule Take 20 mg by mouth daily.    . sitaGLIPtin (JANUVIA) 100 MG tablet  Take 1 tablet (100 mg total) by mouth daily. 90 tablet 3  . tetrahydrozoline 0.05 % ophthalmic solution Place 2 drops into both eyes daily as needed (dry eyes).     No current facility-administered medications on file prior to visit.     Allergies  Allergen Reactions  . Plavix [Clopidogrel Bisulfate] Shortness Of Breath  . Oxycontin [Oxycodone Hcl] Other (See Comments)    Malaise  . Penicillins Other (See Comments)    [place] Has patient had a PCN reaction causing immediate rash,  facial/tongue/throat swelling, SOB or lightheadedness with hypotension: No Has patient had a PCN reaction causing severe rash involving mucus membranes or skin necrosis: No Has patient had a PCN reaction that required hospitalization No Has patient had a PCN reaction occurring within the last 10 years: No If all of the above answers are "NO", then may proceed with Cephalosporin use.   . Pioglitazone Other (See Comments)    Made his chest hurt  . Prednisone Other (See Comments)    Reaction: muscle aches and soreness  . Simvastatin Other (See Comments)    Myalgias    Family History  Problem Relation Age of Onset  . Ovarian cancer Mother   . Colon cancer Sister 77    deceased from colon cancer    Social History   Social History  . Marital status: Married    Spouse name: N/A  . Number of children: 2  . Years of education: N/A   Occupational History  . 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 Smoking status: Former Smoker    Packs/day: 1.00    Years: 20.00    Types: Cigarettes    Start date: 03/08/1965    Quit date: 07/07/1993  . Smokeless tobacco: Never Used  . Alcohol use 0.0 oz/week     Comment: beer or mixed drink occasionally  . Drug use: No  . Sexual activity: Yes   Other Topics Concern  . None   Social History Narrative  . None   Review of Systems - See HPI.  All other ROS are negative.  BP 130/68   Pulse 76   Temp 97.5 F (36.4 C) (Oral)   Resp 14   Ht 5' 7" (1.702 m)   Wt 175 lb (79.4 kg)   SpO2 97%   BMI 27.41 kg/m   Physical Exam  Constitutional: He is well-developed, well-nourished, and in no distress. No distress.  HENT:  Right Ear: External ear normal. Tympanic membrane is not erythematous. A middle ear effusion (serous) is present.  Left Ear: External ear normal. Tympanic membrane is not erythematous. A middle ear effusion (serous) is present.  Nose: Nose normal.    Mouth/Throat: Oropharynx is clear and moist. No oropharyngeal exudate.  Eyes: Conjunctivae are normal. Pupils are equal, round, and reactive to light. No scleral icterus.  Neck: Neck supple.  Cardiovascular: Normal rate, regular rhythm and normal heart sounds.  Exam reveals no gallop and no friction rub.   No murmur heard. Pulmonary/Chest: Effort normal and breath sounds normal. No respiratory distress. He has no wheezes. He has no rales.  Abdominal: Soft. Bowel sounds are normal. He exhibits no distension and no mass. There is no rebound and no guarding.  Lymphadenopathy:    He has no cervical adenopathy.  Skin: He is not diaphoretic.    Recent Results (from the past 2160 hour(s))  Lipid panel  Status: None   Collection Time: 03/04/16 10:29 AM  Result Value Ref Range   Cholesterol 141 0 - 200 mg/dL    Comment: ATP III Classification       Desirable:  < 200 mg/dL               Borderline High:  200 - 239 mg/dL          High:  > = 240 mg/dL   Triglycerides 63.0 0.0 - 149.0 mg/dL    Comment: Normal:  <150 mg/dLBorderline High:  150 - 199 mg/dL   HDL 42.80 >39.00 mg/dL   VLDL 12.6 0.0 - 40.0 mg/dL   LDL Cholesterol 86 0 - 99 mg/dL   Total CHOL/HDL Ratio 3     Comment:                Men          Women1/2 Average Risk     3.4          3.3Average Risk          5.0          4.42X Average Risk          9.6          7.13X Average Risk          15.0          11.0                       NonHDL 98.10     Comment: NOTE:  Non-HDL goal should be 30 mg/dL higher than patient's LDL goal (i.e. LDL goal of < 70 mg/dL, would have non-HDL goal of < 100 mg/dL)  CBC with Differential/Platelet     Status: None   Collection Time: 03/04/16 10:29 AM  Result Value Ref Range   WBC 8.5 4.0 - 10.5 K/uL   RBC 4.33 4.22 - 5.81 Mil/uL   Hemoglobin 14.4 13.0 - 17.0 g/dL   HCT 42.2 39.0 - 52.0 %   MCV 97.4 78.0 - 100.0 fl   MCHC 34.1 30.0 - 36.0 g/dL   RDW 13.3 11.5 - 15.5 %   Platelets 290.0 150.0 - 400.0  K/uL   Neutrophils Relative % 71.0 43.0 - 77.0 %   Lymphocytes Relative 20.3 12.0 - 46.0 %   Monocytes Relative 4.6 3.0 - 12.0 %   Eosinophils Relative 3.6 0.0 - 5.0 %   Basophils Relative 0.5 0.0 - 3.0 %   Neutro Abs 6.0 1.4 - 7.7 K/uL   Lymphs Abs 1.7 0.7 - 4.0 K/uL   Monocytes Absolute 0.4 0.1 - 1.0 K/uL   Eosinophils Absolute 0.3 0.0 - 0.7 K/uL   Basophils Absolute 0.0 0.0 - 0.1 K/uL  Comprehensive metabolic panel     Status: Abnormal   Collection Time: 03/04/16 10:29 AM  Result Value Ref Range   Sodium 138 135 - 145 mEq/L   Potassium 4.6 3.5 - 5.1 mEq/L   Chloride 100 96 - 112 mEq/L   CO2 30 19 - 32 mEq/L   Glucose, Bld 183 (H) 70 - 99 mg/dL   BUN 24 (H) 6 - 23 mg/dL   Creatinine, Ser 0.77 0.40 - 1.50 mg/dL   Total Bilirubin 0.4 0.2 - 1.2 mg/dL   Alkaline Phosphatase 47 39 - 117 U/L   AST 19 0 - 37 U/L   ALT 31 0 - 53 U/L   Total Protein 7.0 6.0 - 8.3 g/dL  Albumin 4.4 3.5 - 5.2 g/dL   Calcium 9.5 8.4 - 10.5 mg/dL   GFR 104.13 >60.00 mL/min  TSH     Status: None   Collection Time: 03/04/16 10:29 AM  Result Value Ref Range   TSH 1.85 0.35 - 4.50 uIU/mL  Urinalysis, Routine w reflex microscopic     Status: Abnormal   Collection Time: 03/04/16 10:29 AM  Result Value Ref Range   Color, Urine YELLOW Yellow;Lt. Yellow   APPearance CLEAR Clear   Specific Gravity, Urine 1.015 1.000 - 1.030   pH 5.5 5.0 - 8.0   Total Protein, Urine NEGATIVE Negative   Urine Glucose >=1000 (A) Negative    Comment: Results faxed to site/floor on 03/04/2016 4:57 PM by Delorise Jackson.   Ketones, ur NEGATIVE Negative   Bilirubin Urine NEGATIVE Negative   Hgb urine dipstick NEGATIVE Negative   Urobilinogen, UA 0.2 0.0 - 1.0   Leukocytes, UA NEGATIVE Negative   Nitrite NEGATIVE Negative   WBC, UA none seen 0-2/hpf   RBC / HPF none seen 0-2/hpf   Squamous Epithelial / LPF Rare(0-4/hpf) Rare(0-4/hpf)  POCT HgB A1C     Status: None   Collection Time: 04/16/16  9:10 AM  Result Value Ref Range    Hemoglobin A1C 7.5   Basic metabolic panel     Status: Abnormal   Collection Time: 04/30/16  8:42 PM  Result Value Ref Range   Sodium 135 135 - 145 mmol/L   Potassium 4.6 3.5 - 5.1 mmol/L   Chloride 100 (L) 101 - 111 mmol/L   CO2 26 22 - 32 mmol/L   Glucose, Bld 214 (H) 65 - 99 mg/dL   BUN 22 (H) 6 - 20 mg/dL   Creatinine, Ser 1.02 0.61 - 1.24 mg/dL   Calcium 9.1 8.9 - 10.3 mg/dL   GFR calc non Af Amer >60 >60 mL/min   GFR calc Af Amer >60 >60 mL/min    Comment: (NOTE) The eGFR has been calculated using the CKD EPI equation. This calculation has not been validated in all clinical situations. eGFR's persistently <60 mL/min signify possible Chronic Kidney Disease.    Anion gap 9 5 - 15  CBC     Status: Abnormal   Collection Time: 04/30/16  8:42 PM  Result Value Ref Range   WBC 9.0 4.0 - 10.5 K/uL   RBC 4.15 (L) 4.22 - 5.81 MIL/uL   Hemoglobin 13.4 13.0 - 17.0 g/dL   HCT 40.1 39.0 - 52.0 %   MCV 96.6 78.0 - 100.0 fL   MCH 32.3 26.0 - 34.0 pg   MCHC 33.4 30.0 - 36.0 g/dL   RDW 12.6 11.5 - 15.5 %   Platelets 246 150 - 400 K/uL  I-stat troponin, ED     Status: None   Collection Time: 04/30/16  8:47 PM  Result Value Ref Range   Troponin i, poc 0.00 0.00 - 0.08 ng/mL   Comment 3            Comment: Due to the release kinetics of cTnI, a negative result within the first hours of the onset of symptoms does not rule out myocardial infarction with certainty. If myocardial infarction is still suspected, repeat the test at appropriate intervals.   D-dimer, quantitative (not at [facility])     Status: None   Collection Time: 04/30/16  8:47 PM  Result Value Ref Range   D-Dimer, Quant <0.27 0.00 - 0.50 ug/mL-FEU    Comment: REPEATED TO VERIFY (NOTE) At  the manufacturer cut-off of 0.50 ug/mL FEU, this assay has been documented to exclude PE with a sensitivity and negative predictive value of 97 to 99%.  At this time, this assay has not been approved by the FDA to exclude  DVT/VTE. Results should be correlated with clinical presentation.    Assessment/Plan: 1. Atypical chest pain ER workup including EKG. Troponin, d-dimer, CXR all unremarkable for cause. Suspected GERD-related. Agree that it is definitely contributing but there are atypical features. Exam unremarkable. Will stop OTC prilosec and attempt trial of Protonix. GERD diet reviewed. Continue chronic medications. Patient set up with ne GI specialist. Has appointment with Cardiology in 2 days. He if to follow-up with them to determine need for repeat stress testing (last in 2015). Alarm signs/symptoms reviewed with patient that would prompt return to ER. Low threshold for return given. Spoke with supervising MD, Dr. Birdie Riddle to discuss case. She is in agreement with assessment and plan.    Leeanne Rio, PA-C

## 2016-05-11 NOTE — Patient Instructions (Signed)
Please stop the over-the-counter Prilosec. Start the Protonix as directed. Continue other medications as directed. I am setting you up with one of our GI specialists for further assessment. I want you to keep your appointment on Thursday with Cardiology for further assessment, just to further r/o cardiac cause of symptoms. They may want a repeat stress testing.  If you note any significant chest discomfort or any associated lightheadedness, dizziness or racing heart, I want you to call 911.   I encourage you to increase hydration and the amount of fiber in your diet.  Start a daily probiotic (Align, Culturelle, Digestive Advantage, etc.). If no bowel movement within 24 hours, take 2 Tbs of Milk of Magnesia in a 4 oz glass of warmed prune juice every 2-3 days to help promote bowel movement. If no results within 24 hours, then repeat above regimen, adding a Dulcolax stool softener to regimen. If this does not promote a bowel movement, please call the office.

## 2016-05-12 ENCOUNTER — Encounter: Payer: Self-pay | Admitting: Nurse Practitioner

## 2016-05-12 ENCOUNTER — Ambulatory Visit: Payer: Medicare Other | Admitting: Nurse Practitioner

## 2016-05-12 ENCOUNTER — Ambulatory Visit (INDEPENDENT_AMBULATORY_CARE_PROVIDER_SITE_OTHER): Payer: Medicare Other | Admitting: Nurse Practitioner

## 2016-05-12 ENCOUNTER — Other Ambulatory Visit: Payer: Self-pay

## 2016-05-12 DIAGNOSIS — Z86010 Personal history of colonic polyps: Secondary | ICD-10-CM

## 2016-05-12 DIAGNOSIS — K5900 Constipation, unspecified: Secondary | ICD-10-CM

## 2016-05-12 DIAGNOSIS — Z860101 Personal history of adenomatous and serrated colon polyps: Secondary | ICD-10-CM | POA: Insufficient documentation

## 2016-05-12 MED ORDER — PEG 3350-KCL-NA BICARB-NACL 420 G PO SOLR: 4000.0000 mL | ORAL | 0 refills | Status: DC

## 2016-05-12 NOTE — Assessment & Plan Note (Addendum)
Noted constipation with a bowel movement once a day but sensation of incomplete emptying. Stools are hard, typically Bristol 133. At this point I'll have him try Colace 100 mg once a day. I will have him add MiraLAX 17 g once a day for soft bowel movements, which she can increase to twice a day if needed. We will proceed with colonoscopy due to stool/bowel habit changes and he is due for surveillance at this time. Return for follow-up in 2 months.  Proceed with TCS with Dr. Gala Romney in near future: the risks, benefits, and alternatives have been discussed with the patient in detail. The patient states understanding and desires to proceed.  Patient is currently on Valium, Lantus, Glucophage, Januvia. At this point given his age conscious sedation should be adequate for his procedure was for his last. I will have him hold half of his diabetes medicines and I before, take no diabetes medicines the morning of.  After discussing the plan with the patient, he stated he would like to have his colonoscopy in [place]. The him that we do not go to facilities in [place] and if he wanted a colonoscopy down there he would have to see a GI down in [place]. It sounds like he is wanting to do this. I will have the nurse check if he is wishing to change GI providers.

## 2016-05-12 NOTE — Progress Notes (Signed)
Referring Provider: Delorse Limber Primary Care Physician:  Leeanne Rio, PA-C Primary GI:  Dr. Gala Romney  Chief Complaint  Patient presents with  . Constipation    HPI:   Marcus Norton is a 76 y.o. male who presents For evaluation of constipation. Patient was last seen in our office 06/09/2015 for nausea. At that time he was having significant sinus drainage from bronchitis and started Zyrtec which helped with the nausea. Abdominal pain resolved. At that time his symptoms have been quiescent for about 3 weeks. It was noted he is due for colonoscopy in 2018 with a personal history of polyps and family history of colon cancer in his sister who succumbed to disease in her mid 40s.  Today he states he's had some bowel changes which started about 4 months ago. Used to be formed, soft. No consistent with Bristol 1-3, stools hard, requires straining. Has a bowel movement daily, but sensation of incomplete emptying. In the last 6 weeks has had about 2 "normal" bowel movements. Has started Dulcolax and eating bran cereal which has helped some. Is now having some hemorrhoid symptoms including burning and fullness. Denies hematochezia, melena, fever, chills, unintentional weight loss. Has excessive belching, occasional/rare GERD symptoms. Denies chest pain, dyspnea, dizziness, lightheadedness, syncope, near syncope. Denies any other upper or lower GI symptoms.  Past Medical History:  Diagnosis Date  . 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 ASCVD (arteriosclerotic cardiovascular disease)    -luminal irregularities in 1998 and 2005; normal EF  . Benign prostatic hypertrophy    status post transurethral resection of the prostate  . Diabetes mellitus   . Dysphagia   . Erectile dysfunction   . Hyperlipidemia   . Hypertension   . Seasonal allergies   . Tobacco abuse    20 pack years; discontinued 1995    Past Surgical History:  Procedure Laterality Date  . APPENDECTOMY    .  COLONOSCOPY  07/20/2011   Dr. Gala Romney: multiple hyperplastic polyps. Surveillance 2018  . COLONOSCOPY W/ BIOPSIES AND POLYPECTOMY  07/08/08, 06/2011   Dr Madolyn Frieze papilla, diminutive rectal polyp ablated the, left-sided diverticula, piecemeal polypectomy of hepatic flexure adenomatous polyp, diminutive polyps near hepatic flexure ablated  . ESOPHAGOGASTRODUODENOSCOPY  2013   erosive reflux esophagitis, s/p empiric dilation. chronic duodenitis.   [name] NASAL SEPTUM SURGERY     Correction of deviated septum  . PERIPHERAL VASCULAR CATHETERIZATION N/A 11/26/2015   Procedure: Abdominal Aortogram w/Lower Extremity;  Surgeon: Wellington Hampshire, MD;  Location: [facility];  Service: Cardiovascular;  Laterality: N/A;  . TONSILLECTOMY    . TRANSURETHRAL RESECTION OF PROSTATE  2009    Current Outpatient Prescriptions  Medication Sig Dispense Refill  . ACCU-CHEK AVIVA PLUS test strip U BID  11  . acetaminophen (TYLENOL) 500 MG tablet Take 500 mg by mouth daily as needed for mild pain.    [name] albuterol (PROVENTIL HFA;VENTOLIN HFA) 108 (90 Base) MCG/ACT inhaler 1-2 inhalations every 4-6 hours as needed for wheezing. Dispense spacer as needed.    [name] aspirin EC 81 MG tablet Take 81 mg by mouth daily at 6 PM.     . atorvastatin (LIPITOR) 10 MG tablet Take 5 mg by mouth every other day.     . Cyanocobalamin (VITAMIN B 12 PO) Take 2,000 Units by mouth daily.     . diazepam (VALIUM) 5 MG tablet TK 1 T PO HS FOR SLP  5  . LANTUS SOLOSTAR 100 UNIT/ML Solostar Pen Inject  18 Units into the skin daily at 10 pm. 15 mL 11  . lisinopril (PRINIVIL,ZESTRIL) 20 MG tablet Take 1 tablet (20 mg total) by mouth daily. 30 tablet 6  . loratadine (CLARITIN) 10 MG tablet Take 10 mg by mouth daily.    . metFORMIN (GLUCOPHAGE) 1000 MG tablet Take 1 tablet (1,000 mg total) by mouth 2 (two) times daily with a meal. 180 tablet 3  . nitroGLYCERIN (NITROSTAT) 0.4 MG SL tablet Place 1 tablet (0.4 mg total) under the tongue every 5 (five)  minutes x 3 doses as needed. (Patient taking differently: Place 0.4 mg under the tongue every 5 (five) minutes x 3 doses as needed for chest pain. ) 30 tablet 3  . pantoprazole (PROTONIX) 40 MG tablet Take 1 tablet (40 mg total) by mouth daily. 30 tablet 0  . sitaGLIPtin (JANUVIA) 100 MG tablet Take 1 tablet (100 mg total) by mouth daily. 90 tablet 3  . tetrahydrozoline 0.05 % ophthalmic solution Place 2 drops into both eyes daily as needed (dry eyes).     No current facility-administered medications for this visit.     Allergies as of 05/12/2016 - Review Complete 05/12/2016  Allergen Reaction Noted  . Plavix [clopidogrel bisulfate] Shortness Of Breath 02/24/2016  . Oxycontin [oxycodone hcl] Other (See Comments) 05/14/2012  . Penicillins Other (See Comments) 04/30/2013  . Pioglitazone Other (See Comments) 04/30/2013  . Prednisone Other (See Comments) 07/14/2011  . Simvastatin Other (See Comments) 05/14/2012    Family History  Problem Relation Age of Onset  . Ovarian cancer Mother   . Colon cancer Sister 78    deceased from colon cancer    Social History   Social History  . Marital status: Married    Spouse name: N/A  . Number of children: 2  . Years of education: N/A   Occupational History  . 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 Smoking status: Former Smoker    Packs/day: 1.00    Years: 20.00    Types: Cigarettes    Start date: 03/08/1965    Quit date: 07/07/1993  . Smokeless tobacco: Never Used  . Alcohol use 0.0 oz/week     Comment: beer or mixed drink occasionally  . Drug use: No  . Sexual activity: Yes   Other Topics Concern  . None   Social History Narrative  . None    Review of Systems: General: Negative for anorexia, weight loss, fever, chills, fatigue, weakness. ENT: Negative for hoarseness, difficulty swallowing. CV: Negative for chest pain, angina, palpitations, peripheral edema.    Respiratory: Negative for dyspnea at rest, cough, sputum, wheezing.  GI: See history of present illness. Endo: Negative for unusual weight change.  Heme: Negative for bruising or bleeding. Allergy: Negative for rash or hives.   Physical Exam: BP 136/64   Pulse 93   Temp 98.1 F (36.7 C) (Oral)   Ht 5\' 7"  (1.702 m)   Wt 174 lb 3.2 oz (79 kg)   BMI 27.28 kg/m  General:   Alert and oriented. Pleasant and cooperative. Well-nourished and well-developed.  Eyes:  Without icterus, sclera clear and conjunctiva pink.  Ears:  Normal auditory acuity. Cardiovascular:  S1, S2 present without murmurs appreciated. Extremities without clubbing or edema. Respiratory:  Clear to auscultation bilaterally. No wheezes, rales, or rhonchi. No distress.  Gastrointestinal:  +BS, soft, non-tender and non-distended. No HSM noted. No guarding or rebound. Midline ventral hernia  appreciated but soft and non-tender.  Rectal:  Deferred  Musculoskalatal:  Symmetrical without gross deformities. Neurologic:  Alert and oriented x4;  grossly normal neurologically. Psych:  Alert and cooperative. Normal mood and affect. Heme/Lymph/Immune: No excessive bruising noted.    05/12/2016 3:04 PM   Disclaimer: This note was dictated with voice recognition software. Similar sounding words can inadvertently be transcribed and may not be corrected upon review.

## 2016-05-12 NOTE — Patient Instructions (Signed)
1. We will schedule your colonoscopy for you. 2. Take Colace stool softener, 100 mg, once a day. 3. Take MiraLAX 17 g (1 capful) once a day. You can increase this to twice a day for regular, soft bowel movements and complete emptying. 4. Call us in 2 weeks and let us know how you're doing with her constipation. 5. Return for follow-up in 2 months.

## 2016-05-12 NOTE — Assessment & Plan Note (Signed)
History of adenomatous colon polyps as well as family history of colon cancer in his sister who was diagnosed in her mid 18s. His last colonoscopy was in 2013 and recommended 5 year repeat exam. He will be due this year. Given new onset constipation we'll proceed with his colonoscopy a couple months early to further evaluate. Constipation management as per above. Return for follow-up in 2 months.

## 2016-05-12 NOTE — Patient Instructions (Signed)
PA info for TCS submitted via [facility] website. No PA needed. Decision ID# [id number].

## 2016-05-13 ENCOUNTER — Ambulatory Visit (INDEPENDENT_AMBULATORY_CARE_PROVIDER_SITE_OTHER): Payer: Medicare Other | Admitting: Cardiovascular Disease

## 2016-05-13 ENCOUNTER — Encounter: Payer: Self-pay | Admitting: Cardiovascular Disease

## 2016-05-13 VITALS — BP 136/60 | HR 90 | Ht 67.0 in | Wt 174.0 lb

## 2016-05-13 DIAGNOSIS — I10 Essential (primary) hypertension: Secondary | ICD-10-CM

## 2016-05-13 DIAGNOSIS — I209 Angina pectoris, unspecified: Secondary | ICD-10-CM

## 2016-05-13 DIAGNOSIS — I739 Peripheral vascular disease, unspecified: Secondary | ICD-10-CM

## 2016-05-13 DIAGNOSIS — R002 Palpitations: Secondary | ICD-10-CM

## 2016-05-13 DIAGNOSIS — I2510 Atherosclerotic heart disease of native coronary artery without angina pectoris: Secondary | ICD-10-CM

## 2016-05-13 DIAGNOSIS — E785 Hyperlipidemia, unspecified: Secondary | ICD-10-CM | POA: Diagnosis not present

## 2016-05-13 MED ORDER — DILTIAZEM HCL 30 MG PO TABS: 30.0000 mg | ORAL_TABLET | Freq: Two times a day (BID) | ORAL | 11 refills | Status: DC

## 2016-05-13 NOTE — Patient Instructions (Addendum)
Your physician wants you to follow-up in: 1 year with Dr Virgina Jock will receive a reminder letter in the mail two months in advance. If you don't receive a letter, please call our office to schedule the follow-up appointment   Your physician has requested that you have a lexiscan myoview. For further information please visit [email or web address]. Please follow instruction sheet, as given.     START Diltiazem 30 mg twice a day              Thank you for choosing [facility] !

## 2016-05-13 NOTE — Progress Notes (Signed)
SUBJECTIVE: The patient presents for follow-up of nonobstructive coronary artery disease, PSVT, hypertension, and hyperlipidemia. He has peripheral vascular disease and underwent bilateral common iliac artery stent placement by Dr. Fletcher Anon.  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  He underwent coronary angiography on 05/09/2003 which only demonstrated luminal irregularities.  Nuclear stress test was normal 04/18/2013.  He had a bad reaction to Plavix but took it for 30 days.  He describes very little claudication pain. He walks on a treadmill for 30 minutes 3 or 4 times per week and plans to go to the [facility] today.  He was evaluated for chest pain in the ED on 04/30/16. Troponins, d-dimer, and chest x-ray were all normal.  He describes a right-sided chest tightness. He also complains of "skipped beats".  ECG 04/30/16: Sinus rhythm with incomplete right bundle-branch block and left anterior fascicular block, old anteroseptal infarct, and ST segment and T-wave abnormalities in inferolateral leads. This is unchanged from 2017.   Review of Systems: As per "subjective", otherwise negative.  Allergies  Allergen Reactions  . Plavix [Clopidogrel Bisulfate] Shortness Of Breath  . Oxycontin [Oxycodone Hcl] Other (See Comments)    Malaise  . Penicillins Other (See Comments)    [place] Has patient had a PCN reaction causing immediate rash, facial/tongue/throat swelling, SOB or lightheadedness with hypotension: No Has patient had a PCN reaction causing severe rash involving mucus membranes or skin necrosis: No Has patient had a PCN reaction that required hospitalization No Has patient had a PCN reaction occurring within the last 10 years: No If all of the above answers are "NO", then may proceed with Cephalosporin use.   .  Pioglitazone Other (See Comments)    Made his chest hurt  . Prednisone Other (See Comments)    Reaction: muscle aches and soreness  . Simvastatin Other (See Comments)    Myalgias    Current Outpatient Prescriptions  Medication Sig Dispense Refill  . ACCU-CHEK AVIVA PLUS test strip U BID  11  . acetaminophen (TYLENOL) 500 MG tablet Take 500 mg by mouth daily as needed for mild pain.    [name] albuterol (PROVENTIL HFA;VENTOLIN HFA) 108 (90 Base) MCG/ACT inhaler 1-2 inhalations every 4-6 hours as needed for wheezing. Dispense spacer as needed.    [name] aspirin EC 81 MG tablet Take 81 mg by mouth daily at 6 PM.     . atorvastatin (LIPITOR) 10 MG tablet Take 5 mg by mouth every other day.     . Cyanocobalamin (VITAMIN B 12 PO) Take 2,000 Units by mouth daily.     . diazepam (VALIUM) 5 MG tablet TK 1 T PO HS FOR SLP  5  . LANTUS SOLOSTAR 100 UNIT/ML Solostar Pen Inject 18 Units into the skin daily at 10 pm. 15 mL 11  . lisinopril (PRINIVIL,ZESTRIL) 20 MG tablet Take 1 tablet (20 mg total) by mouth daily. 30 tablet 6  . loratadine (CLARITIN) 10 MG tablet Take 10 mg by mouth daily.    . metFORMIN (GLUCOPHAGE) 1000 MG tablet Take 1 tablet (1,000 mg total) by mouth 2 (two) times daily with a meal. 180 tablet 3  . nitroGLYCERIN (NITROSTAT) 0.4 MG SL tablet Place 1 tablet (0.4 mg total) under the tongue every 5 (five) minutes x 3 doses as needed. (Patient taking differently: Place 0.4 mg under the tongue every 5 (five) minutes  x 3 doses as needed for chest pain. ) 30 tablet 3  . pantoprazole (PROTONIX) 40 MG tablet Take 1 tablet (40 mg total) by mouth daily. 30 tablet 0  . polyethylene glycol-electrolytes (TRILYTE) 420 g solution Take 4,000 mLs by mouth as directed. 4000 mL 0  . sitaGLIPtin (JANUVIA) 100 MG tablet Take 1 tablet (100 mg total) by mouth daily. 90 tablet 3  . tetrahydrozoline 0.05 % ophthalmic solution Place 2 drops into both eyes daily as needed (dry eyes).     No current facility-administered  medications for this visit.     Past Medical History:  Diagnosis Date  . 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 ASCVD (arteriosclerotic cardiovascular disease)    -luminal irregularities in 1998 and 2005; normal EF  . Benign prostatic hypertrophy    status post transurethral resection of the prostate  . Diabetes mellitus   . Dysphagia   . Erectile dysfunction   . Hyperlipidemia   . Hypertension   . Seasonal allergies   . Tobacco abuse    20 pack years; discontinued 1995    Past Surgical History:  Procedure Laterality Date  . APPENDECTOMY    . COLONOSCOPY  07/20/2011   Dr. Gala Romney: multiple hyperplastic polyps. Surveillance 2018  . COLONOSCOPY W/ BIOPSIES AND POLYPECTOMY  07/08/08, 06/2011   Dr Madolyn Frieze papilla, diminutive rectal polyp ablated the, left-sided diverticula, piecemeal polypectomy of hepatic flexure adenomatous polyp, diminutive polyps near hepatic flexure ablated  . ESOPHAGOGASTRODUODENOSCOPY  2013   erosive reflux esophagitis, s/p empiric dilation. chronic duodenitis.   [name] NASAL SEPTUM SURGERY     Correction of deviated septum  . PERIPHERAL VASCULAR CATHETERIZATION N/A 11/26/2015   Procedure: Abdominal Aortogram w/Lower Extremity;  Surgeon: Wellington Hampshire, MD;  Location: [facility];  Service: Cardiovascular;  Laterality: N/A;  . TONSILLECTOMY    . TRANSURETHRAL RESECTION OF PROSTATE  2009    Social History   Social History  . Marital status: Married    Spouse name: N/A  . Number of children: 2  . Years of education: N/A   Occupational History  . retired-farmer, 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   Social History Main Topics  . Smoking status: Former Smoker    Packs/day: 1.00    Years: 20.00    Types: Cigarettes    Start date: 03/08/1965    Quit date: 07/07/1993  . Smokeless tobacco: Never Used  . Alcohol use 0.0 oz/week     Comment: beer or mixed drink occasionally  . Drug use: No  . Sexual activity: Yes   Other Topics  Concern  . Not on file   Social History Narrative  . No narrative on file     Vitals:   05/13/16 1003  BP: 136/60  Pulse: 90  SpO2: 94%  Weight: 174 lb (78.9 kg)  Height: 5\' 7"  (1.702 m)    PHYSICAL EXAM General: NAD HEENT: Normal. Neck: No JVD, no thyromegaly. Lungs: Clear to auscultation bilaterally with normal respiratory effort. CV: Nondisplaced PMI.  Regular rate and rhythm, normal S1/S2, no T5/V7, soft 1/6 systolic murmur over RUSB. No pretibial or periankle edema.  No carotid bruit.   Abdomen: Soft, nontender, no distention.  Neurologic: Alert and oriented.  Psych: Normal affect. Skin: Normal. Musculoskeletal: No gross deformities.    ECG: Most recent ECG reviewed.      ASSESSMENT AND PLAN:  1. Palpitations/sinus tachycardia/PSVT: Did not tolerate metoprolol. A previous event monitor showed symptoms which correlated  with sinus rhythm, sinus tachycardia with PACs, and one episode of paroxysmal SVT. I will try diltiazem 30 mg bid.  2. Essential HTN: Controlled today. Monitor given addition of diltiazem.  3. Nonobstructive CAD with angina: Prior nuclear myocardial perfusion study was normal in 03/2013. I will repeat Lexiscan Myoview. Continue ASA and Lipitor.I will start diltiazem for both palpitations and anginal discomfort.  4. Hyperlipidemia: LDL 86 on 03/04/16. On Lipitor 5 mg every other day, and managed by his PCP.   5. PVD: Continues to walk without difficulty. Continue ASA and Lipitor. He underwent bilateral common iliac artery stent placement in 2017. Follows with Dr. Fletcher Anon.  Dispo: f/u 1 year.  Kate Sable, M.D., F.A.C.C.

## 2016-05-17 NOTE — Progress Notes (Signed)
CC'D TO PCP °

## 2016-05-27 LAB — HM DIABETES EYE EXAM

## 2016-05-31 ENCOUNTER — Encounter (HOSPITAL_COMMUNITY)
Admission: RE | Admit: 2016-05-31 | Discharge: 2016-05-31 | Disposition: A | Discharge status: Home / Self Care | Payer: Medicare Other | Source: Ambulatory Visit | Attending: Cardiovascular Disease | Admitting: Cardiovascular Disease

## 2016-05-31 ENCOUNTER — Inpatient Hospital Stay (HOSPITAL_COMMUNITY): Admission: RE | Admit: 2016-05-31 | Payer: Medicare Other | Source: Ambulatory Visit

## 2016-05-31 DIAGNOSIS — I209 Angina pectoris, unspecified: Secondary | ICD-10-CM

## 2016-06-04 ENCOUNTER — Ambulatory Visit: Payer: Medicare Other | Admitting: Internal Medicine

## 2016-06-08 ENCOUNTER — Ambulatory Visit: Payer: Medicare Other | Admitting: Cardiovascular Disease

## 2016-06-08 ENCOUNTER — Inpatient Hospital Stay (HOSPITAL_COMMUNITY): Admission: RE | Admit: 2016-06-08 | Payer: Medicare Other | Source: Ambulatory Visit

## 2016-06-08 ENCOUNTER — Encounter (HOSPITAL_COMMUNITY): Payer: Self-pay

## 2016-06-08 ENCOUNTER — Encounter (HOSPITAL_COMMUNITY)
Admission: RE | Admit: 2016-06-08 | Discharge: 2016-06-08 | Disposition: A | Discharge status: Home / Self Care | Payer: Medicare Other | Source: Ambulatory Visit | Attending: Cardiovascular Disease | Admitting: Cardiovascular Disease

## 2016-06-08 DIAGNOSIS — I5189 Other ill-defined heart diseases: Secondary | ICD-10-CM | POA: Diagnosis not present

## 2016-06-08 DIAGNOSIS — I209 Angina pectoris, unspecified: Secondary | ICD-10-CM

## 2016-06-08 LAB — NM MYOCAR MULTI W/SPECT W/WALL MOTION / EF
LHR: 0.28
LVDIAVOL: 64 mL (ref 62–150)
LVSYSVOL: 36 mL
Peak HR: 111 {beats}/min
Rest HR: 83 {beats}/min
SDS: 7
SRS: 7
SSS: 14
TID: 0.91

## 2016-06-08 MED ORDER — TECHNETIUM TC 99M TETROFOSMIN IV KIT
30.0000 | PACK | Freq: Once | INTRAVENOUS | Status: AC | PRN
Start: 2016-06-08 — End: 2016-06-08
  Administered 2016-06-08: 30 via INTRAVENOUS

## 2016-06-08 MED ORDER — TECHNETIUM TC 99M TETROFOSMIN IV KIT
10.0000 | PACK | Freq: Once | INTRAVENOUS | Status: AC | PRN
  Administered 2016-06-08: 11 via INTRAVENOUS

## 2016-06-08 MED ORDER — SODIUM CHLORIDE 0.9% FLUSH
INTRAVENOUS | Status: AC
  Administered 2016-06-08: 10 mL via INTRAVENOUS
  Filled 2016-06-08: qty 10

## 2016-06-08 MED ORDER — REGADENOSON 0.4 MG/5ML IV SOLN
INTRAVENOUS | Status: AC
  Administered 2016-06-08: 0.4 mg via INTRAVENOUS
  Filled 2016-06-08: qty 5

## 2016-06-21 ENCOUNTER — Ambulatory Visit (INDEPENDENT_AMBULATORY_CARE_PROVIDER_SITE_OTHER): Payer: Medicare Other | Admitting: Physician Assistant

## 2016-06-21 ENCOUNTER — Encounter: Payer: Self-pay | Admitting: Physician Assistant

## 2016-06-21 VITALS — BP 120/58 | HR 83 | Temp 97.4°F | Resp 14 | Ht 67.0 in | Wt 174.0 lb

## 2016-06-21 DIAGNOSIS — N50819 Testicular pain, unspecified: Secondary | ICD-10-CM

## 2016-06-21 LAB — POCT URINALYSIS DIPSTICK
BILIRUBIN UA: NEGATIVE
KETONES UA: NEGATIVE
Leukocytes, UA: NEGATIVE
Nitrite, UA: NEGATIVE
PH UA: 6 (ref 5.0–8.0)
Protein, UA: NEGATIVE
Spec Grav, UA: >=1.03 — AB (ref 1.010–1.025)
Urobilinogen, UA: 0.2 E.U./dL

## 2016-06-21 MED ORDER — CIPROFLOXACIN HCL 250 MG PO TABS: 250.0000 mg | ORAL_TABLET | Freq: Two times a day (BID) | ORAL | 0 refills | Status: DC

## 2016-06-21 NOTE — Progress Notes (Signed)
Patient presents to clinic today c/o pain in R testicle with urination first noted this morning. Has occurred every time urinating since then. Only mild ache otherwise. Denies known trauma or injury to testicles or penis. Denies hematuria, nausea/vomiting, lower abdominal pain/back pain/flank pain. Does have history of nephrolithiasis but none in 30 years. Does note increased urinary frequency of urination today without urgency. Denies penile pain or discharge. Denies concern for STI.  Past Medical History:  Diagnosis Date  . 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 ASCVD (arteriosclerotic cardiovascular disease)    -luminal irregularities in 1998 and 2005; normal EF  . Benign prostatic hypertrophy    status post transurethral resection of the prostate  . Diabetes mellitus   . Dysphagia   . Erectile dysfunction   . Hyperlipidemia   . Hypertension   . Seasonal allergies   . Tobacco abuse    20 pack years; discontinued 1995    Current Outpatient Prescriptions on File Prior to Visit  Medication Sig Dispense Refill  . ACCU-CHEK AVIVA PLUS test strip U BID  11  . acetaminophen (TYLENOL) 500 MG tablet Take 500 mg by mouth daily as needed for mild pain.    [name] aspirin EC 81 MG tablet Take 81 mg by mouth daily at 6 PM.     . atorvastatin (LIPITOR) 10 MG tablet Take 5 mg by mouth every other day.     . Cyanocobalamin (VITAMIN B 12 PO) Take 2,000 Units by mouth daily.     . diazepam (VALIUM) 5 MG tablet TK 1 T PO HS FOR SLP  5  . LANTUS SOLOSTAR 100 UNIT/ML Solostar Pen Inject 18 Units into the skin daily at 10 pm. 15 mL 11  . lisinopril (PRINIVIL,ZESTRIL) 20 MG tablet Take 1 tablet (20 mg total) by mouth daily. 30 tablet 6  . loratadine (CLARITIN) 10 MG tablet Take 10 mg by mouth daily.    . metFORMIN (GLUCOPHAGE) 1000 MG tablet Take 1 tablet (1,000 mg total) by mouth 2 (two) times daily with a meal. 180 tablet 3  . nitroGLYCERIN (NITROSTAT) 0.4 MG SL tablet Place 1 tablet (0.4 mg total) under  the tongue every 5 (five) minutes x 3 doses as needed. (Patient taking differently: Place 0.4 mg under the tongue every 5 (five) minutes x 3 doses as needed for chest pain. ) 30 tablet 3  . sitaGLIPtin (JANUVIA) 100 MG tablet Take 1 tablet (100 mg total) by mouth daily. 90 tablet 3  . diltiazem (CARDIZEM) 30 MG tablet Take 1 tablet (30 mg total) by mouth 2 (two) times daily. (Patient not taking: Reported on 06/21/2016) 60 tablet 11  . pantoprazole (PROTONIX) 40 MG tablet Take 1 tablet (40 mg total) by mouth daily. (Patient not taking: Reported on 06/21/2016) 30 tablet 0  . polyethylene glycol-electrolytes (TRILYTE) 420 g solution Take 4,000 mLs by mouth as directed. (Patient not taking: Reported on 06/21/2016) 4000 mL 0   No current facility-administered medications on file prior to visit.     Allergies  Allergen Reactions  . Plavix [Clopidogrel Bisulfate] Shortness Of Breath  . Oxycontin [Oxycodone Hcl] Other (See Comments)    Malaise  . Penicillins Other (See Comments)    [place] Has patient had a PCN reaction causing immediate rash, facial/tongue/throat swelling, SOB or lightheadedness with hypotension: No Has patient had a PCN reaction causing severe rash involving mucus membranes or skin necrosis: No Has patient had a PCN reaction that required hospitalization No Has patient had a PCN  reaction occurring within the last 10 years: No If all of the above answers are "NO", then may proceed with Cephalosporin use.   . Pioglitazone Other (See Comments)    Made his chest hurt  . Prednisone Other (See Comments)    Reaction: muscle aches and soreness  . Simvastatin Other (See Comments)    Myalgias    Family History  Problem Relation Age of Onset  . Ovarian cancer Mother   . Colon cancer Sister 39    deceased from colon cancer    Social History   Social History  . Marital status: Married    Spouse name: N/A  . Number of children: 2  . Years of education: N/A   Occupational  History  . 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 Smoking status: Former Smoker    Packs/day: 1.00    Years: 20.00    Types: Cigarettes    Start date: 03/08/1965    Quit date: 07/07/1993  . Smokeless tobacco: Never Used  . Alcohol use 0.0 oz/week     Comment: beer or mixed drink occasionally  . Drug use: No  . Sexual activity: Yes   Other Topics Concern  . None   Social History Narrative  . None   Review of Systems - See HPI.  All other ROS are negative.  BP (!) 120/58   Pulse 83   Temp 97.4 F (36.3 C) (Oral)   Resp 14   Ht _0  (1.702 m)   Wt 174 lb (78.9 kg)   SpO2 97%   BMI 27.25 kg/m   Physical Exam  Constitutional: He is oriented to person, place, and time and well-developed, well-nourished, and in no distress.  HENT:  Head: Normocephalic and atraumatic.  Eyes: Conjunctivae are normal.  Neck: Neck supple.  Cardiovascular: Normal rate, regular rhythm, normal heart sounds and intact distal pulses.   Pulmonary/Chest: Effort normal and breath sounds normal. No respiratory distress. He has no wheezes. He has no rales. He exhibits no tenderness.  Abdominal: Soft. Bowel sounds are normal. He exhibits no distension. There is no tenderness.  Genitourinary: Penis normal. He exhibits epididymal tenderness (Right). He exhibits no abnormal testicular mass, no testicular tenderness, no abnormal scrotal mass and no scrotal tenderness.  Neurological: He is alert and oriented to person, place, and time.  Skin: Skin is warm and dry. No rash noted.  Psychiatric: Affect normal.  Vitals reviewed.   Recent Results (from the past 2160 hour(s))  POCT HgB A1C     Status: None   Collection Time: 04/16/16  9:10 AM  Result Value Ref Range   Hemoglobin A1C 7.5   Basic metabolic panel     Status: Abnormal   Collection Time: 04/30/16  8:42 PM  Result Value Ref Range   Sodium 135 135 - 145 mmol/L   Potassium 4.6 3.5 -  5.1 mmol/L   Chloride 100 (L) 101 - 111 mmol/L   CO2 26 22 - 32 mmol/L   Glucose, Bld 214 (H) 65 - 99 mg/dL   BUN 22 (H) 6 - 20 mg/dL   Creatinine, Ser 1.02 0.61 - 1.24 mg/dL   Calcium 9.1 8.9 - 10.3 mg/dL   GFR calc non Af Amer >60 >60 mL/min   GFR calc Af Amer >60 >60 mL/min    Comment: (NOTE) The eGFR has been calculated using the CKD EPI equation. This calculation has not been validated  in all clinical situations. eGFR's persistently <60 mL/min signify possible Chronic Kidney Disease.    Anion gap 9 5 - 15  CBC     Status: Abnormal   Collection Time: 04/30/16  8:42 PM  Result Value Ref Range   WBC 9.0 4.0 - 10.5 K/uL   RBC 4.15 (L) 4.22 - 5.81 MIL/uL   Hemoglobin 13.4 13.0 - 17.0 g/dL   HCT 40.1 39.0 - 52.0 %   MCV 96.6 78.0 - 100.0 fL   MCH 32.3 26.0 - 34.0 pg   MCHC 33.4 30.0 - 36.0 g/dL   RDW 12.6 11.5 - 15.5 %   Platelets 246 150 - 400 K/uL  I-stat troponin, ED     Status: None   Collection Time: 04/30/16  8:47 PM  Result Value Ref Range   Troponin i, poc 0.00 0.00 - 0.08 ng/mL   Comment 3            Comment: Due to the release kinetics of cTnI, a negative result within the first hours of the onset of symptoms does not rule out myocardial infarction with certainty. If myocardial infarction is still suspected, repeat the test at appropriate intervals.   D-dimer, quantitative (not at [facility])     Status: None   Collection Time: 04/30/16  8:47 PM  Result Value Ref Range   D-Dimer, Quant <0.27 0.00 - 0.50 ug/mL-FEU    Comment: REPEATED TO VERIFY (NOTE) At the manufacturer cut-off of 0.50 ug/mL FEU, this assay has been documented to exclude PE with a sensitivity and negative predictive value of 97 to 99%.  At this time, this assay has not been approved by the FDA to exclude DVT/VTE. Results should be correlated with clinical presentation.   [facility] DIABETES EYE EXAM     Status: Abnormal   Collection Time: 05/27/16 12:00 AM  Result Value Ref Range   [facility] Diabetic Eye  Exam Retinopathy (A) No Retinopathy  NM Myocar Multi W/Spect W/Wall Motion / EF     Status: None   Collection Time: 06/08/16 11:11 AM  Result Value Ref Range   Rest HR 83 bpm   Rest BP 140/78 mmHg   Peak HR 111 bpm   Peak BP 176/72 mmHg   SSS 14    SRS 7    SDS 7    LHR 0.28    TID 0.91    LV sys vol 36 mL   LV dias vol 64 62 - 150 mL    Assessment/Plan: 1. Epididymal pain Starting today. Mild tenderness on examination. Otherwise unremarkable exam. Urine dip + blood. No LE or nitrites. Will send for micro and culture. Mild epididymitis suspected. Differential includes stone in ureter but less likely giving timing of symptoms and epididymal tenderness. Start supportive measures and NSAIDs. Rx Cipro sent to start if micro is negative for calcium oxalate. NSAID as directed. Will follow-up with patient tomorrow morning.  - POCT Urinalysis Dipstick - Urine Microscopic Only - Urine Culture - ciprofloxacin (CIPRO) 250 MG tablet; Take 1 tablet (250 mg total) by mouth 2 (two) times daily.  Dispense: 6 tablet; Refill: 0   Leeanne Rio, [place]

## 2016-06-21 NOTE — Patient Instructions (Addendum)
Your examination is consistent with a mild epididymitis.  Ice and elevate the testicle.  Use Advil to help with inflammation. I have sent in the Cipro -- if symptoms are worsening before culture results and micro negative we will start the antibiotic.  If we note any worsening pain, would recommend imaging to assess for a stone as it can cause referred pain to this area if the stone is stuck in the lower ureter.

## 2016-06-21 NOTE — Progress Notes (Signed)
Pre visit review using our clinic review tool, if applicable. No additional management support is needed unless otherwise documented below in the visit note. 

## 2016-06-22 ENCOUNTER — Telehealth: Payer: Self-pay | Admitting: Emergency Medicine

## 2016-06-22 ENCOUNTER — Ambulatory Visit: Payer: Medicare Other | Admitting: Cardiovascular Disease

## 2016-06-22 ENCOUNTER — Encounter (HOSPITAL_COMMUNITY): Payer: Medicare Other

## 2016-06-22 LAB — URINALYSIS, MICROSCOPIC ONLY

## 2016-06-22 NOTE — Telephone Encounter (Signed)
Called patient on cell (preferred number). LMOVM for callback.

## 2016-06-22 NOTE — Telephone Encounter (Signed)
LMOVM of status of how symptoms are today.

## 2016-06-22 NOTE — Telephone Encounter (Signed)
Spoke with patient and his symptoms today has improved from yesterday. He is still having some dysuria and small painful sensation. Has been taking Advil with relief He has not picked up the rx for Cipro until urine culture results.

## 2016-06-23 ENCOUNTER — Telehealth: Payer: Self-pay | Admitting: Physician Assistant

## 2016-06-23 NOTE — Telephone Encounter (Signed)
LMOVM of results.

## 2016-06-23 NOTE — Telephone Encounter (Signed)
Pt calling for results on urine culture and asking about a Rx if he needed to pick one up at pharmacy.

## 2016-06-24 LAB — URINE CULTURE: ORGANISM ID, BACTERIA: NO GROWTH

## 2016-06-24 NOTE — Telephone Encounter (Signed)
LMOVM for lab results

## 2016-06-24 NOTE — Telephone Encounter (Signed)
Patient wife advised of urine microscopic results, waiting on urine culture. She states he is still having some symptoms but improved. Possibly passed a ureteral stone. He is known for hx of kidney stones.

## 2016-06-24 NOTE — Telephone Encounter (Signed)
See second phone note regarding this. Also see result notes.

## 2016-06-29 ENCOUNTER — Encounter (HOSPITAL_COMMUNITY): Payer: Medicare Other

## 2016-06-29 ENCOUNTER — Ambulatory Visit: Payer: Medicare Other | Admitting: Cardiovascular Disease

## 2016-06-30 ENCOUNTER — Encounter (HOSPITAL_COMMUNITY)
Admission: RE | Disposition: A | Discharge status: Home / Self Care | Payer: Self-pay | Source: Ambulatory Visit | Attending: Internal Medicine

## 2016-06-30 ENCOUNTER — Encounter (HOSPITAL_COMMUNITY): Payer: Self-pay | Admitting: *Deleted

## 2016-06-30 ENCOUNTER — Ambulatory Visit (HOSPITAL_COMMUNITY)
Admission: RE | Admit: 2016-06-30 | Discharge: 2016-06-30 | Disposition: A | Discharge status: Home / Self Care | Payer: Medicare Other | Source: Ambulatory Visit | Attending: Internal Medicine | Admitting: Internal Medicine

## 2016-06-30 DIAGNOSIS — Z1211 Encounter for screening for malignant neoplasm of colon: Secondary | ICD-10-CM | POA: Diagnosis not present

## 2016-06-30 DIAGNOSIS — I10 Essential (primary) hypertension: Secondary | ICD-10-CM | POA: Insufficient documentation

## 2016-06-30 DIAGNOSIS — Z86010 Personal history of colonic polyps: Secondary | ICD-10-CM | POA: Diagnosis not present

## 2016-06-30 DIAGNOSIS — I2510 Atherosclerotic heart disease of native coronary artery without angina pectoris: Secondary | ICD-10-CM | POA: Insufficient documentation

## 2016-06-30 DIAGNOSIS — E119 Type 2 diabetes mellitus without complications: Secondary | ICD-10-CM | POA: Insufficient documentation

## 2016-06-30 DIAGNOSIS — Z87891 Personal history of nicotine dependence: Secondary | ICD-10-CM | POA: Insufficient documentation

## 2016-06-30 DIAGNOSIS — E785 Hyperlipidemia, unspecified: Secondary | ICD-10-CM | POA: Insufficient documentation

## 2016-06-30 DIAGNOSIS — N400 Enlarged prostate without lower urinary tract symptoms: Secondary | ICD-10-CM | POA: Insufficient documentation

## 2016-06-30 DIAGNOSIS — Z7982 Long term (current) use of aspirin: Secondary | ICD-10-CM | POA: Diagnosis not present

## 2016-06-30 DIAGNOSIS — Z79899 Other long term (current) drug therapy: Secondary | ICD-10-CM | POA: Insufficient documentation

## 2016-06-30 HISTORY — PX: COLONOSCOPY: SHX5424

## 2016-06-30 LAB — GLUCOSE, CAPILLARY: GLUCOSE-CAPILLARY: 130 mg/dL — AB (ref 65–99)

## 2016-06-30 SURGERY — COLONOSCOPY
Anesthesia: Moderate Sedation

## 2016-06-30 MED ORDER — ONDANSETRON HCL 4 MG/2ML IJ SOLN
INTRAMUSCULAR | Status: DC | PRN
  Administered 2016-06-30: 4 mg via INTRAVENOUS

## 2016-06-30 MED ORDER — ONDANSETRON HCL 4 MG/2ML IJ SOLN
INTRAMUSCULAR | Status: AC
  Filled 2016-06-30: qty 2

## 2016-06-30 MED ORDER — MIDAZOLAM HCL 5 MG/5ML IJ SOLN
INTRAMUSCULAR | Status: AC
  Filled 2016-06-30: qty 10

## 2016-06-30 MED ORDER — MEPERIDINE HCL 100 MG/ML IJ SOLN
INTRAMUSCULAR | Status: AC
  Filled 2016-06-30: qty 2

## 2016-06-30 MED ORDER — MIDAZOLAM HCL 5 MG/5ML IJ SOLN
INTRAMUSCULAR | Status: DC | PRN
  Administered 2016-06-30: 1 mg via INTRAVENOUS
  Administered 2016-06-30: 2 mg via INTRAVENOUS

## 2016-06-30 MED ORDER — SODIUM CHLORIDE 0.9 % IV SOLN
INTRAVENOUS | Status: DC
  Administered 2016-06-30: 1000 mL via INTRAVENOUS

## 2016-06-30 MED ORDER — MEPERIDINE HCL 100 MG/ML IJ SOLN
INTRAMUSCULAR | Status: DC | PRN
  Administered 2016-06-30 (×2): 25 mg

## 2016-06-30 NOTE — Discharge Instructions (Addendum)
Colonoscopy Discharge Instructions  Read the instructions outlined below and refer to this sheet in the next few weeks. These discharge instructions provide you with general information on caring for yourself after you leave the hospital. Your doctor may also give you specific instructions. While your treatment has been planned according to the most current medical practices available, unavoidable complications occasionally occur. If you have any problems or questions after discharge, call Dr. Gala Romney at [phone]. ACTIVITY  You may resume your regular activity, but move at a slower pace for the next 24 hours.   Take frequent rest periods for the next 24 hours.   Walking will help get rid of the air and reduce the bloated feeling in your belly (abdomen).   No driving for 24 hours (because of the medicine (anesthesia) used during the test).    Do not sign any important legal documents or operate any machinery for 24 hours (because of the anesthesia used during the test).  NUTRITION  Drink plenty of fluids.   You may resume your normal diet as instructed by your doctor.   Begin with a light meal and progress to your normal diet. Heavy or fried foods are harder to digest and may make you feel sick to your stomach (nauseated).   Avoid alcoholic beverages for 24 hours or as instructed.  MEDICATIONS  You may resume your normal medications unless your doctor tells you otherwise.  WHAT YOU CAN EXPECT TODAY  Some feelings of bloating in the abdomen.   Passage of more gas than usual.   Spotting of blood in your stool or on the toilet paper.  IF YOU HAD POLYPS REMOVED DURING THE COLONOSCOPY:  No aspirin products for 7 days or as instructed.   No alcohol for 7 days or as instructed.   Eat a soft diet for the next 24 hours.  FINDING OUT THE RESULTS OF YOUR TEST Not all test results are available during your visit. If your test results are not back during the visit, make an appointment  with your caregiver to find out the results. Do not assume everything is normal if you have not heard from your caregiver or the medical facility. It is important for you to follow up on all of your test results.  SEEK IMMEDIATE MEDICAL ATTENTION IF:  You have more than a spotting of blood in your stool.   Your belly is swollen (abdominal distention).   You are nauseated or vomiting.   You have a temperature over 101.   You have abdominal pain or discomfort that is severe or gets worse throughout the day.    I do not recommend a future colonoscopy unless new symptoms develop  Begin Protonix 40 mg daily new  GERD information provided  Office visit with [place] in about 4-6 weeks.   Food Choices for Gastroesophageal Reflux Disease, Adult When you have gastroesophageal reflux disease (GERD), the foods you eat and your eating habits are very important. Choosing the right foods can help ease the discomfort of GERD. Consider working with a diet and nutrition specialist (dietitian) to help you make healthy food choices. What general guidelines should I follow? Eating plan   Choose healthy foods low in fat, such as fruits, vegetables, whole grains, low-fat dairy products, and lean meat, fish, and poultry.  Eat frequent, small meals instead of three large meals each day. Eat your meals slowly, in a relaxed setting. Avoid bending over or lying down until 2-3 hours after eating.  Limit high-fat  foods such as fatty meats or fried foods.  Limit your intake of oils, butter, and shortening to less than 8 teaspoons each day.  Avoid the following:  Foods that cause symptoms. These may be different for different people. Keep a food diary to keep track of foods that cause symptoms.  Alcohol.  Drinking large amounts of liquid with meals.  Eating meals during the 2-3 hours before bed.  Cook foods using methods other than frying. This may include baking, grilling, or broiling. Lifestyle     Maintain a healthy weight. Ask your health care provider what weight is healthy for you. If you need to lose weight, work with your health care provider to do so safely.  Exercise for at least 30 minutes on 5 or more days each week, or as told by your health care provider.  Avoid wearing clothes that fit tightly around your waist and chest.  Do not use any products that contain nicotine or tobacco, such as cigarettes and e-cigarettes. If you need help quitting, ask your health care provider.  Sleep with the head of your bed raised. Use a wedge under the mattress or blocks under the bed frame to raise the head of the bed. What foods are not recommended? The items listed may not be a complete list. Talk with your dietitian about what dietary choices are best for you. Grains  Pastries or quick breads with added fat. [place] toast. Vegetables  Deep fried vegetables. [place] fries. Any vegetables prepared with added fat. Any vegetables that cause symptoms. For some people this may include tomatoes and tomato products, chili peppers, onions and garlic, and horseradish. Fruits  Any fruits prepared with added fat. Any fruits that cause symptoms. For some people this may include citrus fruits, such as oranges, grapefruit, pineapple, and lemons. Meats and other protein foods  High-fat meats, such as fatty beef or pork, hot dogs, ribs, ham, sausage, salami and bacon. Fried meat or protein, including fried fish and fried chicken. Nuts and nut butters. Dairy  Whole milk and chocolate milk. Sour cream. Cream. Ice cream. Cream cheese. Milk shakes. Beverages  Coffee and tea, with or without caffeine. Carbonated beverages. Sodas. Energy drinks. Fruit juice made with acidic fruits (such as orange or grapefruit). Tomato juice. Alcoholic drinks. Fats and oils  Butter. Margarine. Shortening. Ghee. Sweets and desserts  Chocolate and cocoa. Donuts. Seasoning and other foods  Pepper. Peppermint and  spearmint. Any condiments, herbs, or seasonings that cause symptoms. For some people, this may include curry, hot sauce, or vinegar-based salad dressings. Summary  When you have gastroesophageal reflux disease (GERD), food and lifestyle choices are very important to help ease the discomfort of GERD.  Eat frequent, small meals instead of three large meals each day. Eat your meals slowly, in a relaxed setting. Avoid bending over or lying down until 2-3 hours after eating.  Limit high-fat foods such as fatty meat or fried foods. This information is not intended to replace advice given to you by your health care provider. Make sure you discuss any questions you have with your health care provider. Document Released: 02/08/2005 Document Revised: 02/10/2016 Document Reviewed: 02/10/2016 Elsevier Interactive Patient Education  2017 [employer].

## 2016-06-30 NOTE — Op Note (Signed)
[facility] Patient Name: Marcus Norton Procedure Date: 06/30/2016 9:29 AM MRN: [id number] Date of Birth: [birth year] Attending MD: Marcus Norton , MD CSN: [id number] Age: 76 Admit Type: Outpatient Procedure:                Colonoscopy Indications:              High risk colon cancer surveillance: Personal                            history of colonic polyps Providers:                Marcus Richards, MD, Marcus Riggers, RN, Marcus Del, RN Referring MD:              Medicines:                Midazolam 3 mg IV, Meperidine 50 mg IV Complications:            No immediate complications. Estimated Blood Loss:     Estimated blood loss: none. Procedure:                Pre-Anesthesia Assessment:                           - Prior to the procedure, a History and Physical                            was performed, and patient medications and                            allergies were reviewed. The patient's tolerance of                            previous anesthesia was also reviewed. The risks                            and benefits of the procedure and the sedation                            options and risks were discussed with the patient.                            All questions were answered, and informed consent                            was obtained. Prior Anticoagulants: The patient has                            taken no previous anticoagulant or antiplatelet                            agents. ASA Grade Assessment: III - A patient with  severe systemic disease. After reviewing the risks                            and benefits, the patient was deemed in                            satisfactory condition to undergo the procedure.                           After obtaining informed consent, the colonoscope                            was passed under direct vision. Throughout the                            procedure, the  patient's blood pressure, pulse, and                            oxygen saturations were monitored continuously. The                            EC-[id number] ([id number]) scope was introduced through                            the anus and advanced to the the ileocecal valve.                            The colonoscopy was performed without difficulty.                            The patient tolerated the procedure well. The                            quality of the bowel preparation was adequate. The                            ileocecal valve, appendiceal orifice, and rectum                            were photographed. Scope In: 9:56:24 AM Scope Out: 10:13:45 AM Scope Withdrawal Time: 0 hours 10 minutes 40 seconds  Total Procedure Duration: 0 hours 17 minutes 21 seconds  Findings:      The perianal and digital rectal examinations were normal.      The colon (entire examined portion) appeared normal.      The exam was otherwise without abnormality on direct and retroflexion       views. Impression:               - The entire examined colon is normal.                           - The examination was otherwise normal on direct  and retroflexion views.                           - No specimens collected. Moderate Sedation:      Moderate (conscious) sedation was administered by the endoscopy nurse       and supervised by the endoscopist. The following parameters were       monitored: oxygen saturation, heart rate, blood pressure, respiratory       rate, EKG, adequacy of pulmonary ventilation, and response to care.       Total physician intraservice time was 23 minutes. Recommendation:           - Patient has a contact number available for                            emergencies. The signs and symptoms of potential                            delayed complications were discussed with the                            patient. Return to normal activities tomorrow.                             Written discharge instructions were provided to the                            patient.                           - Resume previous diet.                           - Continue present medications.                           - No repeat colonoscopy due to age.                           - Return to GI office in 6 weeks. Patient                            describing significant reflux symptoms. I have                            started him back on Protonix 40 mg daily. Procedure Code(s):        --- Professional ---                           [phone], Colonoscopy, flexible; diagnostic, including                            collection of specimen(s) by brushing or washing,                            when performed (separate procedure)  [id number], Moderate sedation services provided by the                            same physician or other qualified health care                            professional performing the diagnostic or                            therapeutic service that the sedation supports,                            requiring the presence of an independent trained                            observer to assist in the monitoring of the                            patient's level of consciousness and physiological                            status; initial 15 minutes of intraservice time,                            patient age 61 years or older                           [phone], Moderate sedation services; each additional                            15 minutes intraservice time Diagnosis Code(s):        --- Professional ---                           Z86.010, Personal history of colonic polyps CPT copyright 2016 American Medical Association. All rights reserved. The codes documented in this report are preliminary and upon coder review may  be revised to meet current compliance requirements. Marcus Norton. Marcus Memon, MD Marcus Richards, MD 06/30/2016 10:25:43 AM This  report has been signed electronically. Number of Addenda: 0

## 2016-06-30 NOTE — H&P (Signed)
@LOGO @   Primary Care Physician:  [name], PA-C Primary Gastroenterologist:  Dr. Gala Romney  Pre-Procedure History & Physical: HPI:  Marcus Norton is a 76 y.o. male here for surveillance colonoscopy. History of multiple colonic adenomas removed in 2013. No lower GI tract symptoms currently. Does relate of worsening indigestion of the past few months. Cardiac workup including stress test negative. Given pantoprazole for about a month and Zantac. Pantoprazole helped better than Zantac.  Past Medical History:  Diagnosis Date  . 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 ASCVD (arteriosclerotic cardiovascular disease)    -luminal irregularities in 1998 and 2005; normal EF  . Benign prostatic hypertrophy    status post transurethral resection of the prostate  . Diabetes mellitus   . Dysphagia   . Erectile dysfunction   . Hyperlipidemia   . Hypertension   . Seasonal allergies   . Tobacco abuse    20 pack years; discontinued 1995    Past Surgical History:  Procedure Laterality Date  . APPENDECTOMY    . COLONOSCOPY  07/20/2011   Dr. Gala Romney: multiple hyperplastic polyps. Surveillance 2018  . COLONOSCOPY W/ BIOPSIES AND POLYPECTOMY  07/08/08, 06/2011   Dr Madolyn Frieze papilla, diminutive rectal polyp ablated the, left-sided diverticula, piecemeal polypectomy of hepatic flexure adenomatous polyp, diminutive polyps near hepatic flexure ablated  . ESOPHAGOGASTRODUODENOSCOPY  2013   erosive reflux esophagitis, s/p empiric dilation. chronic duodenitis.   [name] NASAL SEPTUM SURGERY     Correction of deviated septum  . PERIPHERAL VASCULAR CATHETERIZATION N/A 11/26/2015   Procedure: Abdominal Aortogram w/Lower Extremity;  Surgeon: Wellington Hampshire, MD;  Location: [facility];  Service: Cardiovascular;  Laterality: N/A;  . TONSILLECTOMY    . TRANSURETHRAL RESECTION OF PROSTATE  2009    Prior to Admission medications   Medication Sig Start Date End Date Taking? Authorizing Provider   acetaminophen (TYLENOL) 325 MG tablet Take 325 mg by mouth 2 (two) times daily as needed for mild pain.   Yes [provider]  aspirin EC 81 MG tablet Take 81 mg by mouth at bedtime.    Yes [provider]  atorvastatin (LIPITOR) 10 MG tablet Take 5 mg by mouth every other day.  06/27/11  Yes [provider]  Cyanocobalamin (VITAMIN B 12 PO) Take 2,000 Units by mouth daily.    Yes [provider]  diazepam (VALIUM) 5 MG tablet TAKE 1 TABLET BY MOUTH AT BEDTIME AS NEEDED FOR SLEEP 03/17/16  Yes [provider]  LANTUS SOLOSTAR 100 UNIT/ML Solostar Pen Inject 18 Units into the skin daily at 10 pm. 02/17/16  Yes Philemon Kingdom, MD  lisinopril (PRINIVIL,ZESTRIL) 20 MG tablet Take 1 tablet (20 mg total) by mouth daily. 07/10/10  Yes Rothbart, Cristopher Estimable, MD  loratadine (CLARITIN) 10 MG tablet Take 10 mg by mouth daily as needed for allergies.    Yes [provider]  metFORMIN (GLUCOPHAGE) 1000 MG tablet Take 1 tablet (1,000 mg total) by mouth 2 (two) times daily with a meal. 04/16/16  Yes Philemon Kingdom, MD  polyethylene glycol-electrolytes (TRILYTE) 420 g solution Take 4,000 mLs by mouth as directed. 05/12/16  Yes Rourk, Cristopher Estimable, MD  ranitidine (ZANTAC) 75 MG tablet Take 75 mg by mouth 2 (two) times daily as needed for heartburn.   Yes [provider]  sitaGLIPtin (JANUVIA) 100 MG tablet Take 1 tablet (100 mg total) by mouth daily. 02/17/16  Yes Philemon Kingdom, MD  ciprofloxacin (CIPRO) 250 MG tablet Take 1 tablet (250  mg total) by mouth 2 (two) times daily. Patient not taking: Reported on 06/24/2016 06/21/16   [name], PA-C  diltiazem (CARDIZEM) 30 MG tablet Take 1 tablet (30 mg total) by mouth 2 (two) times daily. Patient not taking: Reported on 06/21/2016 05/13/16   Herminio Commons, MD  nitroGLYCERIN (NITROSTAT) 0.4 MG SL tablet Place 1 tablet (0.4 mg total) under the tongue every 5 (five) minutes x 3 doses as  needed. Patient taking differently: Place 0.4 mg under the tongue every 5 (five) minutes x 3 doses as needed for chest pain.  01/12/12   Wall, Marijo Conception, MD  pantoprazole (PROTONIX) 40 MG tablet Take 1 tablet (40 mg total) by mouth daily. Patient not taking: Reported on 06/21/2016 05/11/16   [name], PA-C    Allergies as of 05/12/2016 - Review Complete 05/12/2016  Allergen Reaction Noted  . Plavix [clopidogrel bisulfate] Shortness Of Breath 02/24/2016  . Oxycontin [oxycodone hcl] Other (See Comments) 05/14/2012  . Penicillins Other (See Comments) 04/30/2013  . Pioglitazone Other (See Comments) 04/30/2013  . Prednisone Other (See Comments) 07/14/2011  . Simvastatin Other (See Comments) 05/14/2012    Family History  Problem Relation Age of Onset  . Ovarian cancer Mother   . Colon cancer Sister 3    deceased from colon cancer    Social History   Social History  . Marital status: Married    Spouse name: N/A  . Number of children: 2  . Years of education: N/A   Occupational History  . retired-farmer, [place]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     [employer]   Social History Main Topics  . Smoking status: Former Smoker    Packs/day: 1.00    Years: 20.00    Types: Cigarettes    Start date: 03/08/1965    Quit date: 07/07/1993  . Smokeless tobacco: Never Used  . Alcohol use 0.0 oz/week     Comment: beer or mixed drink occasionally  . Drug use: No  . Sexual activity: Yes   Other Topics Concern  . Not on file   Social History Narrative  . No narrative on file    Review of Systems: See HPI, otherwise negative ROS  Physical Exam: BP (!) 150/64   Pulse 83   Temp (!) 96.9 F (36.1 C) (Axillary)   Resp 17   Ht 5\' 7"  (1.702 m)   Wt 170 lb (77.1 kg)   SpO2 100%   BMI 26.63 kg/m  General:   Alert,  Well-developed, well-nourished, pleasant and cooperative in NAD Neck:  Supple; no masses or thyromegaly. No significant cervical adenopathy. Lungs:  Clear throughout  to auscultation.   No wheezes, crackles, or rhonchi. No acute distress. Heart:  Regular rate and rhythm; no murmurs, clicks, rubs,  or gallops. Abdomen: Non-distended, normal bowel sounds.  Soft and nontender without appreciable mass or hepatosplenomegaly.  Pulses:  Normal pulses noted. Extremities:  Without clubbing or edema.  Impression:  Pleasant 76 year old here for surveillance colonoscopy. History of colonic polyps. Worsening indigestion symptoms recently. ED visit PCP and cardiac evaluation on negative for coronary disease. Reports vague dysphagia. Did improve significantly on PPI therapy temporarily.  Recommendations:  Plan for stress colonoscopy today.The risks, benefits, limitations, alternatives and imponderables have been reviewed with the patient. Questions have been answered. All parties are agreeable.   We'll also given a prescription for Protonix 40 mg daily. Plan to see him back in the office in about 4-6 weeks and evaluate his reflux symptoms  further.   Notice: This dictation was prepared with Dragon dictation along with smaller phrase technology. Any transcriptional errors that result from this process are unintentional and may not be corrected upon review.

## 2016-07-01 ENCOUNTER — Encounter (HOSPITAL_COMMUNITY): Payer: Self-pay | Admitting: Internal Medicine

## 2016-07-06 ENCOUNTER — Ambulatory Visit (HOSPITAL_COMMUNITY)
Admission: RE | Admit: 2016-07-06 | Discharge: 2016-07-06 | Disposition: A | Discharge status: Home / Self Care | Payer: Medicare Other | Source: Ambulatory Visit | Attending: Cardiology | Admitting: Cardiology

## 2016-07-06 ENCOUNTER — Ambulatory Visit (INDEPENDENT_AMBULATORY_CARE_PROVIDER_SITE_OTHER): Payer: Medicare Other | Admitting: Cardiovascular Disease

## 2016-07-06 VITALS — BP 141/67 | HR 69 | Ht 67.0 in | Wt 174.0 lb

## 2016-07-06 DIAGNOSIS — I739 Peripheral vascular disease, unspecified: Secondary | ICD-10-CM

## 2016-07-06 DIAGNOSIS — I700 Atherosclerosis of aorta: Secondary | ICD-10-CM | POA: Insufficient documentation

## 2016-07-06 DIAGNOSIS — E785 Hyperlipidemia, unspecified: Secondary | ICD-10-CM

## 2016-07-06 DIAGNOSIS — Z95820 Peripheral vascular angioplasty status with implants and grafts: Secondary | ICD-10-CM | POA: Diagnosis not present

## 2016-07-06 DIAGNOSIS — I25118 Atherosclerotic heart disease of native coronary artery with other forms of angina pectoris: Secondary | ICD-10-CM | POA: Diagnosis not present

## 2016-07-06 DIAGNOSIS — I10 Essential (primary) hypertension: Secondary | ICD-10-CM | POA: Diagnosis not present

## 2016-07-06 NOTE — Progress Notes (Signed)
Cardiology Office Note   Date:  07/06/2016   ID:  [name], Marcus Norton, MRN [id number]  PCP:  [name], PA-C  Cardiologist: [name]  No chief complaint on file.     History of Present Illness: Marcus Norton is a 76 y.o. male who presents for a follow-up visit regarding claudication and PAD. He has known history of  paroxysmal supraventricular tachycardia, nonobstructive coronary artery disease, type 2 diabetes since 1990, hypertension and hyperlipidemia. He has known history of claudication . He did not tolerate cilostazol due to palpitations. He is s/p bilateral common iliac artery kissing stent placement in 11/2015. 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  He had an emergency room visit in March 2018 for atypical chest pain with negative cardiac enzymes. He underwent a nuclear stress test which was suggestive of prior inferolateral infarct with mild peri-infarct ischemia. Ejection fraction was 43%. He reports stable right calf claudication which is not lifestyle limiting. The chest pain has improved. The chest pain was mostly right sided and nonexertional. He did notice some increased fatigue and shortness of breath over the last few months.   Past Medical History:  Diagnosis Date  . 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 ASCVD (arteriosclerotic cardiovascular disease)    -luminal irregularities in 1998 and 2005; normal EF  . Benign prostatic hypertrophy    status post transurethral resection of the prostate  . Diabetes mellitus   . Dysphagia   . Erectile dysfunction   . Hyperlipidemia   . Hypertension   . Seasonal allergies   . Tobacco abuse    20 pack years; discontinued 1995    Past Surgical History:  Procedure Laterality Date  . APPENDECTOMY    . COLONOSCOPY  07/20/2011   Dr. Gala Romney: multiple hyperplastic  polyps. Surveillance 2018  . COLONOSCOPY N/A 06/30/2016   Procedure: COLONOSCOPY;  Surgeon: Daneil Dolin, MD;  [facility];  Service: Endoscopy;  Laterality: N/A;  10:00am  . COLONOSCOPY W/ BIOPSIES AND POLYPECTOMY  07/08/08, 06/2011   Dr Madolyn Frieze papilla, diminutive rectal polyp ablated the, left-sided diverticula, piecemeal polypectomy of hepatic flexure adenomatous polyp, diminutive polyps near hepatic flexure ablated  . ESOPHAGOGASTRODUODENOSCOPY  2013   erosive reflux esophagitis, s/p empiric dilation. chronic duodenitis.   [name] NASAL SEPTUM SURGERY     Correction of deviated septum  . PERIPHERAL VASCULAR CATHETERIZATION N/A 11/26/2015   Procedure: Abdominal Aortogram w/Lower Extremity;  Surgeon: Wellington Hampshire, MD;  Location: [facility];  Service: Cardiovascular;  Laterality: N/A;  . TONSILLECTOMY    . TRANSURETHRAL RESECTION OF PROSTATE  2009     Current Outpatient Prescriptions  Medication Sig Dispense Refill  . acetaminophen (TYLENOL) 325 MG tablet Take 325 mg by mouth 2 (two) times daily as needed for mild pain.    [name] aspirin EC 81 MG tablet Take 81 mg by mouth at bedtime.     [name] atorvastatin (LIPITOR) 10 MG tablet Take 5 mg by mouth every other day.     . Cyanocobalamin (VITAMIN B 12 PO) Take 2,000 Units by mouth daily.     . diazepam (VALIUM) 5 MG tablet TAKE 1 TABLET BY MOUTH AT BEDTIME AS NEEDED FOR SLEEP  5  . LANTUS SOLOSTAR 100 UNIT/ML Solostar Pen Inject 18 Units into the skin daily at 10 pm. 15 mL 11  .  lisinopril (PRINIVIL,ZESTRIL) 20 MG tablet Take 1 tablet (20 mg total) by mouth daily. 30 tablet 6  . loratadine (CLARITIN) 10 MG tablet Take 10 mg by mouth daily as needed for allergies.     . metFORMIN (GLUCOPHAGE) 1000 MG tablet Take 1 tablet (1,000 mg total) by mouth 2 (two) times daily with a meal. 180 tablet 3  . nitroGLYCERIN (NITROSTAT) 0.4 MG SL tablet Place 1 tablet (0.4 mg total) under the tongue every 5 (five) minutes x 3 doses as needed.  (Patient taking differently: Place 0.4 mg under the tongue every 5 (five) minutes x 3 doses as needed for chest pain. ) 30 tablet 3  . pantoprazole (PROTONIX) 40 MG tablet Take 1 tablet by mouth daily.  5  . ranitidine (ZANTAC) 75 MG tablet Take 75 mg by mouth 2 (two) times daily as needed for heartburn.    . sitaGLIPtin (JANUVIA) 100 MG tablet Take 1 tablet (100 mg total) by mouth daily. 90 tablet 3   No current facility-administered medications for this visit.     Allergies:   Plavix [clopidogrel bisulfate]; Oxycontin [oxycodone hcl]; Penicillins; Pioglitazone; Prednisone; and Simvastatin    Social History:  The patient  reports that he quit smoking about 23 years ago. His smoking use included Cigarettes. He started smoking about 51 years ago. He has a 20.00 pack-year smoking history. He has never used smokeless tobacco. He reports that he drinks alcohol. He reports that he does not use drugs.   Family History:  The patient's family history includes Colon cancer (age of onset: 7) in his sister; Ovarian cancer in his mother.    ROS:  Please see the history of present illness.   Otherwise, review of systems are positive for none.   All other systems are reviewed and negative.    PHYSICAL EXAM: VS:  BP (!) 141/67   Pulse 69   Ht 5\' 7"  (1.702 m)   Wt 174 lb (78.9 kg)   BMI 27.25 kg/m  , BMI Body mass index is 27.25 kg/m. GEN: Well nourished, well developed, in no acute distress  HEENT: normal  Neck: no JVD, carotid bruits, or masses Cardiac: RRR; no  rubs, or gallops,no edema . 2/6 crescendo decrescendo systolic murmur in the aortic area which is mid peaking Respiratory:  clear to auscultation bilaterally, normal work of breathing GI: soft, nontender, nondistended, + BS MS: no deformity or atrophy  Skin: warm and dry, no rash Neuro:  Strength and sensation are intact Psych: euthymic mood, full affect Vascular: Femoral pulse: Normal bilaterally.    EKG:  EKG is not ordered  today.   Recent Labs: 03/04/2016: ALT 31; TSH 1.85 04/30/2016: BUN 22; Creatinine, Ser 1.02; Hemoglobin 13.4; Platelets 246; Potassium 4.6; Sodium 135    Lipid Panel    Component Value Date/Time   CHOL 141 03/04/2016 1029   TRIG 63.0 03/04/2016 1029   TRIG 96 03/12/2008   HDL 42.80 03/04/2016 1029   CHOLHDL 3 03/04/2016 1029   VLDL 12.6 03/04/2016 1029   LDLCALC 86 03/04/2016 1029   LDLCALC 91 03/12/2008      Wt Readings from Last 3 Encounters:  07/06/16 174 lb (78.9 kg)  06/30/16 170 lb (77.1 kg)  06/21/16 174 lb (78.9 kg)       No flowsheet data found.    ASSESSMENT AND PLAN:  1.  Peripheral arterial disease:   He has stable right calf claudication due to known occluded SFA. Otherwise, his symptoms have improved significantly after  bilateral iliac stenting. ABI today was stable at 0.89 on the right and normal on the left. Duplex showed patent iliac stents. Continue medical therapy and repeat studies in one year.   2. Coronary artery disease involving native coronary arteries with other forms of angina: His chest pain is overall atypical and does not seem to be cardiac. However, he noticed increased exertional fatigue and dyspnea which might be angina equivalent given his diabetes. Nuclear stress test was abnormal and moderate risk. He is going to see [name] for further testing and possibility of cardiac catheterization.   3. Hyperlipidemia: Continue treatment with atorvastatin with a target LDL of less than 70.  4. Essential hypertension: Controlled on lisinopril.    Disposition:   FU with me in 12 months  Signed,  [name], MD  07/06/2016 10:58 AM    [place]

## 2016-07-06 NOTE — Patient Instructions (Signed)
Medication Instructions:  Your physician recommends that you continue on your current medications as directed. Please refer to the Current Medication list given to you today.  Labwork: No new orders.   Testing/Procedures: Your physician has requested that you have an ankle brachial index (ABI) in 1 YEAR. During this test an ultrasound and blood pressure cuff are used to evaluate the arteries that supply the arms and legs with blood. Allow thirty minutes for this exam. There are no restrictions or special instructions.  Your physician has requested that you have an aorto-iliac duplex in 1 YEAR. During this test, an ultrasound is used to evaluate the aorta and iliac arteries. Do not eat after midnight the day before and avoid carbonated beverages  Follow-Up: Your physician wants you to follow-up in: 1 YEAR with Dr Fletcher Anon.  You will receive a reminder letter in the mail two months in advance. If you don't receive a letter, please call our office to schedule the follow-up appointment.   Any Other Special Instructions Will Be Listed Below (If Applicable).     If you need a refill on your cardiac medications before your next appointment, please call your pharmacy.

## 2016-07-12 ENCOUNTER — Encounter: Payer: Self-pay | Admitting: Nurse Practitioner

## 2016-07-12 ENCOUNTER — Encounter: Payer: Self-pay | Admitting: Internal Medicine

## 2016-07-12 ENCOUNTER — Ambulatory Visit (INDEPENDENT_AMBULATORY_CARE_PROVIDER_SITE_OTHER): Payer: Medicare Other | Admitting: Nurse Practitioner

## 2016-07-12 VITALS — BP 150/67 | HR 75 | Temp 96.4°F | Ht 67.0 in | Wt 174.4 lb

## 2016-07-12 DIAGNOSIS — R1013 Epigastric pain: Secondary | ICD-10-CM | POA: Diagnosis not present

## 2016-07-12 DIAGNOSIS — K5900 Constipation, unspecified: Secondary | ICD-10-CM | POA: Diagnosis not present

## 2016-07-12 NOTE — Patient Instructions (Signed)
1. Take your Protonix twice a day for the next 3 months. 2. Continue taking Colace and MiraLAX as needed for constipation. You can try Colace once a day and MiraLAX 3 times a week to see how this dosing does for your symptoms. 3. Return for follow-up in 3 months. 4. Call us of any severe or worsening symptoms before then.

## 2016-07-12 NOTE — Progress Notes (Signed)
Referring Provider: Delorse Limber Primary Care Physician:  [name], PA-C Primary GI:  Dr. Gala Romney  Chief Complaint  Patient presents with  . Constipation    F/U, had tcs    HPI:   Marcus Norton is a 76 y.o. male who presents for follow-up on constipation. The patient was last seen in our office 05/12/2016 for the same as well as to arrange surveillance colonoscopy. At that time he noted some stool changes about 4 months prior to transitioning from formed and soft to [facility] 1-3 hard stools and sensation of incomplete emptying. Only 2 normal bowel movements in the previous 6 weeks. Has started Dulcolax) cereal which helped some. Recommended Colace 100 mg daily, MiraLAX daily, return for follow-up in 2 months.  Colonoscopy completed 06/30/2016 which found entire examined colon normal, no specimens collected. Recommended Protonix 40 mg daily as she was complaining of GERD symptoms at his procedure. Recommended no repeat colonoscopy due to age.  Today he states he's doing well overall. Has had some chest pain recently right chest to middle chest. He is scheduled to see cardiology in the next couple weeks. His most recent episode resolved with OTC antacid. Is still on Pantoprazole and feels this has helped his chest pain some. History of hiatal hernia. Denies abdominal pain, ongoing N/V, hematochezia, melena. Occasional/rare morning nausea which he attributes to sinus drainage. MiraLax is helping his constipation in addition to Colace. May be overshooting and is backing off on Colace and MiraLax. Denies chest pain, dyspnea, dizziness, lightheadedness, syncope, near syncope. Denies any other upper or lower GI symptoms.  Past Medical History:  Diagnosis Date  . 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 ASCVD (arteriosclerotic cardiovascular disease)    -luminal irregularities in 1998 and 2005; normal EF  . Benign prostatic hypertrophy    status post transurethral resection of  the prostate  . Diabetes mellitus   . Dysphagia   . Erectile dysfunction   . Hyperlipidemia   . Hypertension   . Seasonal allergies   . Tobacco abuse    20 pack years; discontinued 1995    Past Surgical History:  Procedure Laterality Date  . APPENDECTOMY    . COLONOSCOPY  07/20/2011   Dr. Gala Romney: multiple hyperplastic polyps. Surveillance 2018  . COLONOSCOPY N/A 06/30/2016   Procedure: COLONOSCOPY;  Surgeon: Daneil Dolin, MD;  [facility];  Service: Endoscopy;  Laterality: N/A;  10:00am  . COLONOSCOPY W/ BIOPSIES AND POLYPECTOMY  07/08/08, 06/2011   Dr Madolyn Frieze papilla, diminutive rectal polyp ablated the, left-sided diverticula, piecemeal polypectomy of hepatic flexure adenomatous polyp, diminutive polyps near hepatic flexure ablated  . ESOPHAGOGASTRODUODENOSCOPY  2013   erosive reflux esophagitis, s/p empiric dilation. chronic duodenitis.   [name] NASAL SEPTUM SURGERY     Correction of deviated septum  . PERIPHERAL VASCULAR CATHETERIZATION N/A 11/26/2015   Procedure: Abdominal Aortogram w/Lower Extremity;  Surgeon: Wellington Hampshire, MD;  Location: [facility];  Service: Cardiovascular;  Laterality: N/A;  . TONSILLECTOMY    . TRANSURETHRAL RESECTION OF PROSTATE  2009    Current Outpatient Prescriptions  Medication Sig Dispense Refill  . acetaminophen (TYLENOL) 325 MG tablet Take 325 mg by mouth 2 (two) times daily as needed for mild pain.    [name] aspirin EC 81 MG tablet Take 81 mg by mouth at bedtime.     [name] atorvastatin (LIPITOR) 10 MG tablet Take 5 mg by mouth every other day.     . Cyanocobalamin (  VITAMIN B 12 PO) Take 2,000 Units by mouth daily.     . diazepam (VALIUM) 5 MG tablet TAKE 1 TABLET BY MOUTH AT BEDTIME AS NEEDED FOR SLEEP  5  . docusate sodium (COLACE) 100 MG capsule Take 100 mg by mouth daily as needed for mild constipation.    [name] LANTUS SOLOSTAR 100 UNIT/ML Solostar Pen Inject 18 Units into the skin daily at 10 pm. 15 mL 11  . lisinopril  (PRINIVIL,ZESTRIL) 20 MG tablet Take 1 tablet (20 mg total) by mouth daily. 30 tablet 6  . loratadine (CLARITIN) 10 MG tablet Take 10 mg by mouth daily as needed for allergies.     . metFORMIN (GLUCOPHAGE) 1000 MG tablet Take 1 tablet (1,000 mg total) by mouth 2 (two) times daily with a meal. 180 tablet 3  . nitroGLYCERIN (NITROSTAT) 0.4 MG SL tablet Place 1 tablet (0.4 mg total) under the tongue every 5 (five) minutes x 3 doses as needed. (Patient taking differently: Place 0.4 mg under the tongue every 5 (five) minutes x 3 doses as needed for chest pain. ) 30 tablet 3  . pantoprazole (PROTONIX) 40 MG tablet Take 1 tablet by mouth daily.  5  . polyethylene glycol (MIRALAX / GLYCOLAX) packet Take 17 g by mouth daily.    . sitaGLIPtin (JANUVIA) 100 MG tablet Take 1 tablet (100 mg total) by mouth daily. 90 tablet 3   No current facility-administered medications for this visit.     Allergies as of 07/12/2016 - Review Complete 07/12/2016  Allergen Reaction Noted  . Plavix [clopidogrel bisulfate] Shortness Of Breath 02/24/2016  . Oxycontin [oxycodone hcl] Other (See Comments) 05/14/2012  . Penicillins Other (See Comments) 04/30/2013  . Pioglitazone Other (See Comments) 04/30/2013  . Prednisone Other (See Comments) 07/14/2011  . Simvastatin Other (See Comments) 05/14/2012    Family History  Problem Relation Age of Onset  . Ovarian cancer Mother   . Colon cancer Sister 55       deceased from colon cancer    Social History   Social History  . Marital status: Married    Spouse name: N/A  . Number of children: 2  . Years of education: N/A   Occupational History  . retired-farmer, 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   Social History Main Topics  . Smoking status: Former Smoker    Packs/day: 1.00    Years: 20.00    Types: Cigarettes    Start date: 03/08/1965    Quit date: 07/07/1993  . Smokeless tobacco: Never Used  . Alcohol use 0.0 oz/week     Comment: occ; no  alcohol this year  . Drug use: No  . Sexual activity: Yes   Other Topics Concern  . None   Social History Narrative  . None    Review of Systems: General: Negative for anorexia, weight loss, fever, chills, fatigue, weakness. ENT: Negative for hoarseness, difficulty swallowing. CV: Negative for chest pain, angina, palpitations, peripheral edema.  Respiratory: Negative for dyspnea at rest, cough, sputum, wheezing.  GI: See history of present illness. Endo: Negative for unusual weight change.  Heme: Negative for bruising or bleeding.   Physical Exam: BP (!) 150/67   Pulse 75   Temp (!) 96.4 F (35.8 C) (Oral)   Ht 5\' 7"  (1.702 m)   Wt 174 lb 6.4 oz (79.1 kg)   BMI 27.31 kg/m  General:   Alert and oriented. Pleasant and cooperative. Well-nourished and well-developed.  Ears:  Normal auditory acuity. Cardiovascular:  S1, S2 present with 2/6 blowing systolic murmur appreciated. Extremities without clubbing or edema. Respiratory:  Clear to auscultation bilaterally. No wheezes, rales, or rhonchi. No distress.  Gastrointestinal:  +BS, soft, non-tender and non-distended. No HSM noted. No guarding or rebound. No masses appreciated.  Rectal:  Deferred  Musculoskalatal:  Symmetrical without gross deformities. Neurologic:  Alert and oriented x4;  grossly normal neurologically. Psych:  Alert and cooperative. Normal mood and affect. Heme/Lymph/Immune: No excessive bruising noted.    07/12/2016 9:58 AM   Disclaimer: This note was dictated with voice recognition software. Similar sounding words can inadvertently be transcribed and may not be corrected upon review.

## 2016-07-12 NOTE — Progress Notes (Signed)
cc'ed to pcp °

## 2016-07-12 NOTE — Assessment & Plan Note (Addendum)
Chest pain mostly middle of the chest to right-sided chest which improves with OTC antacids. Is on Protonix 40 mg once daily. No hematochezia, melena, other concerning/red flag signs/symptoms. Will increase Protonix to 40 mg twice a day for the next 3 months. Return for follow-up in 3 months to see if this further improves his symptoms. Return for follow-up in 3 months. Call with any worsening or severe symptoms before then.

## 2016-07-12 NOTE — Assessment & Plan Note (Signed)
Obstipation has improved significantly on Colace and MiraLAX. He is had to start cutting back on his dosing due to softer/looser stools. Recommend Colace daily and MiraLAX as needed for constipation. He can likely take this 3 times a week with good effect. Return for follow-up in 3 months.

## 2016-07-15 ENCOUNTER — Encounter: Payer: Self-pay | Admitting: Internal Medicine

## 2016-07-15 ENCOUNTER — Ambulatory Visit (INDEPENDENT_AMBULATORY_CARE_PROVIDER_SITE_OTHER): Payer: Medicare Other | Admitting: Internal Medicine

## 2016-07-15 VITALS — BP 142/80 | HR 77 | Ht 67.0 in | Wt 177.0 lb

## 2016-07-15 DIAGNOSIS — E1159 Type 2 diabetes mellitus with other circulatory complications: Secondary | ICD-10-CM

## 2016-07-15 DIAGNOSIS — E1165 Type 2 diabetes mellitus with hyperglycemia: Secondary | ICD-10-CM | POA: Diagnosis not present

## 2016-07-15 LAB — POCT GLYCOSYLATED HEMOGLOBIN (HGB A1C): Hemoglobin A1C: 6.7

## 2016-07-15 NOTE — Progress Notes (Signed)
Patient ID: Marcus Norton, male   DOB: 09-Nov-1940, 76 y.o.   MRN: [id number]   HPI: Marcus Norton is a 76 y.o.-year-old male, returning for f/u for DM2, dx in 1990, insulin-dependent, uncontrolled, with complications (PAD - s/p stents, CAD, Paroxistic SV tachycardia, DR). Last visit 3 mo ago.  Last hemoglobin A1c was: Lab Results  Component Value Date   HGBA1C 7.5 04/16/2016   HGBA1C 7.6 12/31/2015   HGBA1C 9.4 04/23/2009  12/15/2015: HbA1c 7.3%  Pt is on a regimen of: - Metformin 1000 mg 2x a day - Januvia 100 mg in am, before b'fast - started 12/2015 - Lantus 18  units at bedtime  He was on Actos, Glipizide.  Pt checks his sugars 1x a day (in am) and they are: - am: 130-155 >> 118-170, 202 >> 120-150, 165, 170 >> 106-143, 175, 185 - 2h after b'fast: n/c - before lunch: 106 >> 122, 165 >> n/c - 2h after lunch: n/c - before dinner: 132-150 >> 150, 191 >> n/c - 2h after dinner: n/c - bedtime: 164 - nighttime: n/c Lowest sugar was 59 >> 118 >> 115 x1 >> 106; he has hypoglycemia awareness at 70.  Highest sugar was 279 (grapes) >> 202 >> 175 >> 185.  Glucometer: Accu Chek Aviva Plus  Pt's meals are: - Breakfast: cereal + fruit + coffee - Lunch: Sandwich or veggie lunch - Dinner: Meat + veggies + bread - Snacks: 1-2 times a day, sometimes at bedtime  Exercises regularly at the [facility] - was going every other day, now less often (2 days a week) as he had some CP >> hiatal hernia. He started PPI (protonix 40).  - NoCKD, last BUN/creatinine:  Lab Results  Component Value Date   BUN 22 (H) 04/30/2016   BUN 24 (H) 03/04/2016   CREATININE 1.02 04/30/2016   CREATININE 0.77 03/04/2016  On Lisinopril. - last set of lipids: Lab Results  Component Value Date   CHOL 141 03/04/2016   HDL 42.80 03/04/2016   LDLCALC 86 03/04/2016   TRIG 63.0 03/04/2016   CHOLHDL 3 03/04/2016  On Lipitor. - last eye exam was in 05/2016. + DR. - he denies numbness and tingling in his feet.  He  takes B12.  ROS: Constitutional: no weight gain/no weight loss, + fatigue, no subjective hyperthermia, no subjective hypothermia Eyes: no blurry vision, no xerophthalmia ENT: no sore throat, no nodules palpated in throat, no dysphagia, no odynophagia, no hoarseness Cardiovascular: + CP/no SOB/no palpitations/no leg swelling Respiratory: no cough/no SOB/no wheezing Gastrointestinal: no N/no V/no D/no C/+acid reflux Musculoskeletal: no muscle aches/no joint aches Skin: no rashes, no hair loss Neurological: no tremors/no numbness/no tingling/no dizziness  I reviewed pt's medications, allergies, PMH, social hx, family hx, and changes were documented in the history of present illness. Otherwise, unchanged from my initial visit note.  Past Medical History:  Diagnosis Date  . 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 ASCVD (arteriosclerotic cardiovascular disease)    -luminal irregularities in 1998 and 2005; normal EF  . Benign prostatic hypertrophy    status post transurethral resection of the prostate  . Diabetes mellitus   . Dysphagia   . Erectile dysfunction   . Hyperlipidemia   . Hypertension   . Seasonal allergies   . Tobacco abuse    20 pack years; discontinued 1995   Past Surgical History:  Procedure Laterality Date  . APPENDECTOMY    . COLONOSCOPY  07/20/2011   Dr. Gala Romney: multiple hyperplastic polyps. Surveillance 2018  .  COLONOSCOPY N/A 06/30/2016   Procedure: COLONOSCOPY;  Surgeon: Daneil Dolin, MD;  [facility];  Service: Endoscopy;  Laterality: N/A;  10:00am  . COLONOSCOPY W/ BIOPSIES AND POLYPECTOMY  07/08/08, 06/2011   Dr Madolyn Frieze papilla, diminutive rectal polyp ablated the, left-sided diverticula, piecemeal polypectomy of hepatic flexure adenomatous polyp, diminutive polyps near hepatic flexure ablated  . ESOPHAGOGASTRODUODENOSCOPY  2013   erosive reflux esophagitis, s/p empiric dilation. chronic duodenitis.   [name] NASAL SEPTUM SURGERY     Correction of  deviated septum  . PERIPHERAL VASCULAR CATHETERIZATION N/A 11/26/2015   Procedure: Abdominal Aortogram w/Lower Extremity;  Surgeon: Wellington Hampshire, MD;  Location: [place] [facility];  Service: Cardiovascular;  Laterality: N/A;  . TONSILLECTOMY    . TRANSURETHRAL RESECTION OF PROSTATE  2009   Social History   Social History  . Marital status: Married    Spouse name: N/A  . Number of children: 2   Occupational History  . retired-farmer, 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   Social History Main Topics  . Smoking status: Former Smoker    Packs/day: 1.00    Years: 20.00    Types: Cigarettes    Start date: 03/08/1965    Quit date: 07/07/1993  . Smokeless tobacco: Never Used  . Alcohol use 0.0 oz/week     Comment: beer or mixed drink occasionally  . Drug use: No   Current Outpatient Prescriptions on File Prior to Visit  Medication Sig Dispense Refill  . acetaminophen (TYLENOL) 325 MG tablet Take 325 mg by mouth 2 (two) times daily as needed for mild pain.    [name] aspirin EC 81 MG tablet Take 81 mg by mouth at bedtime.     [name] atorvastatin (LIPITOR) 10 MG tablet Take 5 mg by mouth every other day.     . Cyanocobalamin (VITAMIN B 12 PO) Take 2,000 Units by mouth daily.     . diazepam (VALIUM) 5 MG tablet TAKE 1 TABLET BY MOUTH AT BEDTIME AS NEEDED FOR SLEEP  5  . docusate sodium (COLACE) 100 MG capsule Take 100 mg by mouth daily as needed for mild constipation.    [name] LANTUS SOLOSTAR 100 UNIT/ML Solostar Pen Inject 18 Units into the skin daily at 10 pm. 15 mL 11  . lisinopril (PRINIVIL,ZESTRIL) 20 MG tablet Take 1 tablet (20 mg total) by mouth daily. 30 tablet 6  . loratadine (CLARITIN) 10 MG tablet Take 10 mg by mouth daily as needed for allergies.     . metFORMIN (GLUCOPHAGE) 1000 MG tablet Take 1 tablet (1,000 mg total) by mouth 2 (two) times daily with a meal. 180 tablet 3  . nitroGLYCERIN (NITROSTAT) 0.4 MG SL tablet Place 1 tablet (0.4 mg total) under the tongue  every 5 (five) minutes x 3 doses as needed. (Patient taking differently: Place 0.4 mg under the tongue every 5 (five) minutes x 3 doses as needed for chest pain. ) 30 tablet 3  . pantoprazole (PROTONIX) 40 MG tablet Take 1 tablet by mouth 2 (two) times daily.  5  . polyethylene glycol (MIRALAX / GLYCOLAX) packet Take 17 g by mouth daily.    . sitaGLIPtin (JANUVIA) 100 MG tablet Take 1 tablet (100 mg total) by mouth daily. 90 tablet 3   No current facility-administered medications on file prior to visit.    Allergies  Allergen Reactions  . Plavix [Clopidogrel Bisulfate] Shortness Of Breath  . Oxycontin [Oxycodone Hcl] Other (See Comments)  Malaise  . Penicillins Other (See Comments)    [place] Has patient had a PCN reaction causing immediate rash, facial/tongue/throat swelling, SOB or lightheadedness with hypotension: No Has patient had a PCN reaction causing severe rash involving mucus membranes or skin necrosis: No Has patient had a PCN reaction that required hospitalization No Has patient had a PCN reaction occurring within the last 10 years: No If all of the above answers are "NO", then may proceed with Cephalosporin use.   . Pioglitazone Other (See Comments)    Made his chest hurt  . Prednisone Other (See Comments)    Reaction: muscle aches and soreness  . Simvastatin Other (See Comments)    Myalgias   Family History  Problem Relation Age of Onset  . Ovarian cancer Mother   . Colon cancer Sister 75       deceased from colon cancer    PE: BP (!) 142/80 (BP Location: Left Arm, Patient Position: Sitting)   Pulse 77   Ht 5\' 7"  (1.702 m)   Wt 177 lb (80.3 kg)   SpO2 96%   BMI 27.72 kg/m  Wt Readings from Last 3 Encounters:  07/15/16 177 lb (80.3 kg)  07/12/16 174 lb 6.4 oz (79.1 kg)  07/06/16 174 lb (78.9 kg)   Constitutional: slightly overweight, in NAD Eyes: PERRLA, EOMI, no exophthalmos ENT: moist mucous membranes, no thyromegaly, no cervical  lymphadenopathy Cardiovascular: RRR, No MRG Respiratory: CTA B Gastrointestinal: abdomen soft, NT, ND, BS+ Musculoskeletal: no deformities, strength intact in all 4 Skin: moist, warm, no rashes Neurological: no tremor with outstretched hands, DTR normal in all 4  ASSESSMENT: 1. DM2, insulin-dependent, uncontrolled, with complications - PAD - s/p stents - CAD - Paroxistic SV tachycardia - DR  PLAN:  1. Patient with long-standing, uncontrolled diabetes, on oral DM meds + basal insulin, with slightly better control since last visit, although it is difficult to evaluate as he only checks sugars in am >> advised to check at different times of the day - he stays active - but no lows after exercise or activity - A good HbA1c target for him due to age and comorbidities would be <7.5% - Continue current regimen - I suggested to:  Patient Instructions  Please continue:  - Lantus 18 units at bedtime - Metformin 1000 mg 2x a day - Januvia 100 mg in am, before b'fast  Please return in 3 months with your sugar log.   - today, HbA1c is 6.7% (great!) - continue checking sugars at different times of the day - check 1-2x a day, rotating checks - advised for yearly eye exams >> he is UTD - has the next appt in 6 mo as he has DR - Return to clinic in 3 mo with sugar log    Philemon Kingdom, MD PhD [facility]

## 2016-07-15 NOTE — Patient Instructions (Addendum)
Please continue:  - Lantus 18 units at bedtime - Metformin 1000 mg 2x a day - Januvia 100 mg in am, before b'fast  Please check sugars at different times of the day.  Please return in 4 months with your sugar log.

## 2016-07-15 NOTE — Addendum Note (Signed)
Addended by: Caprice Beaver T on: 07/15/2016 09:46 AM   Modules accepted: Orders

## 2016-07-21 ENCOUNTER — Telehealth: Payer: Self-pay | Admitting: Internal Medicine

## 2016-07-21 NOTE — Telephone Encounter (Signed)
Pt called to say that he needed a prescription of protonix called into [facility] in [place]. He said that he went from one a day to two a day.

## 2016-07-21 NOTE — Telephone Encounter (Signed)
Routing to the refill box. 

## 2016-07-22 MED ORDER — PANTOPRAZOLE SODIUM 40 MG PO TBEC: 40.0000 mg | DELAYED_RELEASE_TABLET | Freq: Two times a day (BID) | ORAL | 0 refills | Status: DC

## 2016-07-22 NOTE — Addendum Note (Signed)
Addended by: Gordy Levan, ERIC A on: 07/22/2016 03:56 PM   Modules accepted: Orders

## 2016-07-22 NOTE — Telephone Encounter (Signed)
Please tell the patient hsi Rx was sent to the pharmacy. Call with any questions or concerns.

## 2016-07-22 NOTE — Telephone Encounter (Signed)
Patient made aware.

## 2016-07-27 ENCOUNTER — Other Ambulatory Visit: Payer: Self-pay | Admitting: Cardiovascular Disease

## 2016-07-27 ENCOUNTER — Encounter: Payer: Self-pay | Admitting: Cardiovascular Disease

## 2016-07-27 ENCOUNTER — Telehealth: Payer: Self-pay

## 2016-07-27 ENCOUNTER — Other Ambulatory Visit (HOSPITAL_COMMUNITY)
Admission: RE | Admit: 2016-07-27 | Discharge: 2016-07-27 | Disposition: A | Discharge status: Home / Self Care | Payer: Medicare Other | Source: Ambulatory Visit | Attending: Cardiovascular Disease | Admitting: Cardiovascular Disease

## 2016-07-27 ENCOUNTER — Ambulatory Visit (INDEPENDENT_AMBULATORY_CARE_PROVIDER_SITE_OTHER): Payer: Medicare Other | Admitting: Cardiovascular Disease

## 2016-07-27 VITALS — BP 130/70 | HR 75 | Ht 67.0 in | Wt 174.0 lb

## 2016-07-27 DIAGNOSIS — I209 Angina pectoris, unspecified: Secondary | ICD-10-CM

## 2016-07-27 DIAGNOSIS — Z01818 Encounter for other preprocedural examination: Secondary | ICD-10-CM | POA: Diagnosis present

## 2016-07-27 DIAGNOSIS — I25118 Atherosclerotic heart disease of native coronary artery with other forms of angina pectoris: Secondary | ICD-10-CM

## 2016-07-27 DIAGNOSIS — R9439 Abnormal result of other cardiovascular function study: Secondary | ICD-10-CM | POA: Diagnosis not present

## 2016-07-27 DIAGNOSIS — I739 Peripheral vascular disease, unspecified: Secondary | ICD-10-CM

## 2016-07-27 DIAGNOSIS — I10 Essential (primary) hypertension: Secondary | ICD-10-CM

## 2016-07-27 DIAGNOSIS — E785 Hyperlipidemia, unspecified: Secondary | ICD-10-CM

## 2016-07-27 DIAGNOSIS — R002 Palpitations: Secondary | ICD-10-CM | POA: Diagnosis not present

## 2016-07-27 LAB — CBC WITH DIFFERENTIAL/PLATELET
BASOS ABS: 0 10*3/uL (ref 0.0–0.1)
Basophils Relative: 1 %
EOS ABS: 0.3 10*3/uL (ref 0.0–0.7)
EOS PCT: 3 %
HCT: 44 % (ref 39.0–52.0)
Hemoglobin: 15 g/dL (ref 13.0–17.0)
LYMPHS PCT: 22 %
Lymphs Abs: 1.7 10*3/uL (ref 0.7–4.0)
MCH: 33.3 pg (ref 26.0–34.0)
MCHC: 34.1 g/dL (ref 30.0–36.0)
MCV: 97.6 fL (ref 78.0–100.0)
Monocytes Absolute: 0.5 10*3/uL (ref 0.1–1.0)
Monocytes Relative: 7 %
Neutro Abs: 5.4 10*3/uL (ref 1.7–7.7)
Neutrophils Relative %: 67 %
PLATELETS: 246 10*3/uL (ref 150–400)
RBC: 4.51 MIL/uL (ref 4.22–5.81)
RDW: 12.6 % (ref 11.5–15.5)
WBC: 7.9 10*3/uL (ref 4.0–10.5)

## 2016-07-27 LAB — BASIC METABOLIC PANEL
ANION GAP: 8 (ref 5–15)
BUN: 21 mg/dL — ABNORMAL HIGH (ref 6–20)
CO2: 28 mmol/L (ref 22–32)
CREATININE: 0.83 mg/dL (ref 0.61–1.24)
Calcium: 9.6 mg/dL (ref 8.9–10.3)
Chloride: 99 mmol/L — ABNORMAL LOW (ref 101–111)
GFR calc non Af Amer: >60 mL/min (ref >60)
Glucose, Bld: 142 mg/dL — ABNORMAL HIGH (ref 65–99)
Potassium: 4.6 mmol/L (ref 3.5–5.1)
SODIUM: 135 mmol/L (ref 135–145)

## 2016-07-27 LAB — PROTIME-INR
INR: 0.96
PROTHROMBIN TIME: 12.8 s (ref 11.4–15.2)

## 2016-07-27 NOTE — Patient Instructions (Signed)
   [place] [address] Dept: [phone] Loc: [phone]  DIRON HADDON  07/27/2016  You are scheduled for a Cardiac Catheterization on Thursday, June 7 with Dr. Glenetta Hew.  1. Please arrive at the [facility] (Main Entrance A) at [facility]: [place], [place] [address] at 10:00 AM (two hours before your procedure to ensure your preparation). Free valet parking service is available.   Special note: Every effort is made to have your procedure done on time. Please understand that emergencies sometimes delay scheduled procedures.  2. Diet: Do not eat or drink anything after midnight prior to your procedure except sips of water to take medications.  3. Labs; Done at [facility] Today  4. Medication instructions in preparation for your procedure:   Stop taking, Glucophage (Metformin) on Wednesday, April 6.  HOLD THE NIGHT BEFORE, THE DAY OF CATH, AND FOR 2 DAYS AFTER CATH   Take only 9 units of insulin the night before your procedure. Do not take any insulin on the day of the procedure.  On the morning of your procedure, take your Aspirin and any morning medicines NOT listed above.  You may use sips of water.  5. Plan for one night stay--bring personal belongings. 6. Bring a current list of your medications and current insurance cards. 7. You MUST have a responsible person to drive you home. 8. Someone MUST be with you the first 24 hours after you arrive home or your discharge will be delayed. 9. Please wear clothes that are easy to get on and off and wear slip-on shoes.  Thank you for allowing [place] to care for you!   -- [facility]

## 2016-07-27 NOTE — Progress Notes (Addendum)
SUBJECTIVE: The patient presents to review results of stress testing. Nuclear stress test/17/18 demonstrated a moderate sized, moderate intensity defect in the apical to basal inferolateral wall which was partially reversible suggesting myocardial scar with a mild-to-moderate degree of peri-infarct ischemia. He was deemed an intermediate risk study. Calculated LVEF 43%.  Echocardiogram 12/01/15: Normal left ventricular systolic function, LVEF 67-67%, dynamic obstruction of the left ventricular outflow tract, grade 1 diastolic dysfunction, aortic valve sclerosis without stenosis.  He underwent coronary angiography on 05/09/2003 which only demonstrated luminal irregularities.  He tried diltiazem but could not tolerate it. He said it caused lightheadedness.  He continues to experience with right sided and retrosternal chest pain and a sense of abdominal fullness. He told me his colonoscopy was unremarkable. He is taking Protonix twice daily with only mild relief of chest discomfort. He said he has no energy and sometimes the chest pain "stops him in his tracks ".    Review of Systems: As per "subjective", otherwise negative.  Allergies  Allergen Reactions  . Plavix [Clopidogrel Bisulfate] Shortness Of Breath  . Oxycontin [Oxycodone Hcl] Other (See Comments)    Malaise  . Penicillins Other (See Comments)    [place] Has patient had a PCN reaction causing immediate rash, facial/tongue/throat swelling, SOB or lightheadedness with hypotension: No Has patient had a PCN reaction causing severe rash involving mucus membranes or skin necrosis: No Has patient had a PCN reaction that required hospitalization No Has patient had a PCN reaction occurring within the last 10 years: No If all of the above answers are "NO", then may proceed with Cephalosporin use.   . Pioglitazone Other (See Comments)    Made his chest hurt  . Prednisone Other (See Comments)    Reaction: muscle aches and  soreness  . Simvastatin Other (See Comments)    Myalgias    Current Outpatient Prescriptions  Medication Sig Dispense Refill  . acetaminophen (TYLENOL) 325 MG tablet Take 325 mg by mouth 2 (two) times daily as needed for mild pain.    [name] aspirin EC 81 MG tablet Take 81 mg by mouth at bedtime.     [name] atorvastatin (LIPITOR) 10 MG tablet Take 5 mg by mouth every other day.     . Cyanocobalamin (VITAMIN B 12 PO) Take 2,000 Units by mouth daily.     . diazepam (VALIUM) 5 MG tablet TAKE 1 TABLET BY MOUTH AT BEDTIME AS NEEDED FOR SLEEP  5  . docusate sodium (COLACE) 100 MG capsule Take 100 mg by mouth daily as needed for mild constipation.    [name] LANTUS SOLOSTAR 100 UNIT/ML Solostar Pen Inject 18 Units into the skin daily at 10 pm. 15 mL 11  . lisinopril (PRINIVIL,ZESTRIL) 20 MG tablet Take 1 tablet (20 mg total) by mouth daily. 30 tablet 6  . loratadine (CLARITIN) 10 MG tablet Take 10 mg by mouth daily as needed for allergies.     . metFORMIN (GLUCOPHAGE) 1000 MG tablet Take 1 tablet (1,000 mg total) by mouth 2 (two) times daily with a meal. 180 tablet 3  . nitroGLYCERIN (NITROSTAT) 0.4 MG SL tablet Place 1 tablet (0.4 mg total) under the tongue every 5 (five) minutes x 3 doses as needed. (Patient taking differently: Place 0.4 mg under the tongue every 5 (five) minutes x 3 doses as needed for chest pain. ) 30 tablet 3  . pantoprazole (PROTONIX) 40 MG tablet Take 1 tablet (40 mg total) by mouth 2 (two) times daily  before a meal. 180 tablet 0  . polyethylene glycol (MIRALAX / GLYCOLAX) packet Take 17 g by mouth daily.    . sitaGLIPtin (JANUVIA) 100 MG tablet Take 1 tablet (100 mg total) by mouth daily. 90 tablet 3   No current facility-administered medications for this visit.     Past Medical History:  Diagnosis Date  . 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 ASCVD (arteriosclerotic cardiovascular disease)    -luminal irregularities in 1998 and 2005; normal EF  . Benign prostatic hypertrophy     status post transurethral resection of the prostate  . Diabetes mellitus   . Dysphagia   . Erectile dysfunction   . Hyperlipidemia   . Hypertension   . Seasonal allergies   . Tobacco abuse    20 pack years; discontinued 1995    Past Surgical History:  Procedure Laterality Date  . APPENDECTOMY    . COLONOSCOPY  07/20/2011   Dr. Gala Romney: multiple hyperplastic polyps. Surveillance 2018  . COLONOSCOPY N/A 06/30/2016   Procedure: COLONOSCOPY;  Surgeon: Daneil Dolin, MD;  [facility];  Service: Endoscopy;  Laterality: N/A;  10:00am  . COLONOSCOPY W/ BIOPSIES AND POLYPECTOMY  07/08/08, 06/2011   Dr Madolyn Frieze papilla, diminutive rectal polyp ablated the, left-sided diverticula, piecemeal polypectomy of hepatic flexure adenomatous polyp, diminutive polyps near hepatic flexure ablated  . ESOPHAGOGASTRODUODENOSCOPY  2013   erosive reflux esophagitis, s/p empiric dilation. chronic duodenitis.   [name] NASAL SEPTUM SURGERY     Correction of deviated septum  . PERIPHERAL VASCULAR CATHETERIZATION N/A 11/26/2015   Procedure: Abdominal Aortogram w/Lower Extremity;  Surgeon: Wellington Hampshire, MD;  Location: [place] [facility];  Service: Cardiovascular;  Laterality: N/A;  . TONSILLECTOMY    . TRANSURETHRAL RESECTION OF PROSTATE  2009    Social History   Social History  . Marital status: Married    Spouse name: N/A  . Number of children: 2  . Years of education: N/A   Occupational History  . retired-farmer, 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   Social History Main Topics  . Smoking status: Former Smoker    Packs/day: 1.00    Years: 20.00    Types: Cigarettes    Start date: 03/08/1965    Quit date: 07/07/1993  . Smokeless tobacco: Never Used  . Alcohol use 0.0 oz/week     Comment: occ; no alcohol this year  . Drug use: No  . Sexual activity: Yes   Other Topics Concern  . Not on file   Social History Narrative  . No narrative on file     Vitals:    07/27/16 0941  BP: 130/70  Pulse: 75  SpO2: 95%  Weight: 174 lb (78.9 kg)  Height: 5\' 7"  (1.702 m)    Wt Readings from Last 3 Encounters:  07/27/16 174 lb (78.9 kg)  07/15/16 177 lb (80.3 kg)  07/12/16 174 lb 6.4 oz (79.1 kg)     PHYSICAL EXAM General: NAD HEENT: Normal. Neck: No JVD, no thyromegaly. Lungs: Clear to auscultation bilaterally with normal respiratory effort. CV: Nondisplaced PMI.  Regular rate and rhythm, normal S1/S2, no H4/R7, soft 1/6 systolic murmur over RUSB. No pretibial or periankle edema.  Abdomen: Soft, nontender, no distention.  Neurologic: Alert and oriented.  Psych: Normal affect. Skin: Normal. Musculoskeletal: No gross deformities.    ECG: Most recent ECG reviewed.   Labs: Lab Results  Component Value Date/Time   K 4.6 04/30/2016 08:42 PM  BUN 22 (H) 04/30/2016 08:42 PM   BUN 26 (A) 12/15/2015   CREATININE 1.02 04/30/2016 08:42 PM   CREATININE 0.91 11/18/2015 11:53 AM   ALT 31 03/04/2016 10:29 AM   TSH 1.85 03/04/2016 10:29 AM   HGB 13.4 04/30/2016 08:42 PM     Lipids: Lab Results  Component Value Date/Time   LDLCALC 86 03/04/2016 10:29 AM   LDLCALC 91 03/12/2008   CHOL 141 03/04/2016 10:29 AM   TRIG 63.0 03/04/2016 10:29 AM   TRIG 96 03/12/2008   HDL 42.80 03/04/2016 10:29 AM       ASSESSMENT AND PLAN:  1. Palpitations/sinus tachycardia/PSVT: Unable to tolerate diltiazem. A previous event monitor showed symptoms which correlated with sinus rhythm, sinus tachycardia with PACs, and one episode of paroxysmal SVT. Currently denies palpitations.  2. Essential HTN: Controlled today. No changes.  3. Nonobstructive CAD with angina: While symptoms appear somewhat atypical, he had significantly abnormal stress test results as detailed above and symptoms are thus concerning for angina. Protonix has only provided mild relief.  I will arrange for coronary angiography. Continue ASA and Lipitor. Continue diltiazem for both palpitations  and anginal discomfort. Risks and benefits of cardiac catheterization have been discussed with the patient.  These include bleeding, infection, kidney damage, stroke, heart attack, death.  The patient understands these risks and is willing to proceed.  4. Hyperlipidemia: LDL 86 on 03/04/16. On Lipitor 5 mg every other day, and managed by his PCP.   5. PVD: Continues to walk without difficulty. Continue ASA and Lipitor. He underwent bilateral common iliac artery stent placement in 2017. Follows with Dr. Fletcher Anon.    Disposition: Follow up 1 month.  Time spent: 40 minutes, of which greater than 50% was spent reviewing symptoms, relevant blood tests and studies, and discussing management plan with the patient.   Kate Sable, M.D., F.A.C.C.

## 2016-07-27 NOTE — Telephone Encounter (Signed)
Patient wife (DPR on file) contacted pre-catheterization at [facility] scheduled for: 07/29/2016 @ 1200- verified arrival time of 1000 Confirmed AM meds to be taken pre-cath with sip of water: Confirmed hold metformin, 9 units Lantus night before cath, notified to hold Januvia day of procedure Confirmed patient has responsible person to drive home post procedure and observe patient for 24 hours:  wife Addl concerns:  None noted.  Name and # given if any further questions.

## 2016-07-29 ENCOUNTER — Encounter (HOSPITAL_COMMUNITY)
Admission: RE | Disposition: A | Discharge status: Home / Self Care | Payer: Self-pay | Source: Ambulatory Visit | Attending: Cardiology

## 2016-07-29 ENCOUNTER — Ambulatory Visit (HOSPITAL_COMMUNITY)
Admission: RE | Admit: 2016-07-29 | Discharge: 2016-07-30 | Disposition: A | Discharge status: Home / Self Care | Payer: Medicare Other | Source: Ambulatory Visit | Attending: Cardiology | Admitting: Cardiology

## 2016-07-29 ENCOUNTER — Other Ambulatory Visit: Payer: Self-pay

## 2016-07-29 ENCOUNTER — Encounter (HOSPITAL_COMMUNITY): Payer: Self-pay | Admitting: General Practice

## 2016-07-29 DIAGNOSIS — I208 Other forms of angina pectoris: Secondary | ICD-10-CM | POA: Diagnosis present

## 2016-07-29 DIAGNOSIS — E785 Hyperlipidemia, unspecified: Secondary | ICD-10-CM | POA: Insufficient documentation

## 2016-07-29 DIAGNOSIS — I2584 Coronary atherosclerosis due to calcified coronary lesion: Secondary | ICD-10-CM | POA: Diagnosis not present

## 2016-07-29 DIAGNOSIS — E1151 Type 2 diabetes mellitus with diabetic peripheral angiopathy without gangrene: Secondary | ICD-10-CM | POA: Diagnosis not present

## 2016-07-29 DIAGNOSIS — Z87891 Personal history of nicotine dependence: Secondary | ICD-10-CM | POA: Diagnosis not present

## 2016-07-29 DIAGNOSIS — I10 Essential (primary) hypertension: Secondary | ICD-10-CM | POA: Diagnosis present

## 2016-07-29 DIAGNOSIS — I25118 Atherosclerotic heart disease of native coronary artery with other forms of angina pectoris: Secondary | ICD-10-CM | POA: Diagnosis not present

## 2016-07-29 DIAGNOSIS — R9439 Abnormal result of other cardiovascular function study: Secondary | ICD-10-CM | POA: Diagnosis present

## 2016-07-29 DIAGNOSIS — Z880 Allergy status to penicillin: Secondary | ICD-10-CM | POA: Insufficient documentation

## 2016-07-29 DIAGNOSIS — I739 Peripheral vascular disease, unspecified: Secondary | ICD-10-CM | POA: Diagnosis present

## 2016-07-29 DIAGNOSIS — I471 Supraventricular tachycardia: Secondary | ICD-10-CM | POA: Insufficient documentation

## 2016-07-29 DIAGNOSIS — Z794 Long term (current) use of insulin: Secondary | ICD-10-CM | POA: Diagnosis not present

## 2016-07-29 DIAGNOSIS — I2089 Other forms of angina pectoris: Secondary | ICD-10-CM | POA: Diagnosis present

## 2016-07-29 DIAGNOSIS — Z7982 Long term (current) use of aspirin: Secondary | ICD-10-CM | POA: Insufficient documentation

## 2016-07-29 DIAGNOSIS — N400 Enlarged prostate without lower urinary tract symptoms: Secondary | ICD-10-CM | POA: Diagnosis not present

## 2016-07-29 DIAGNOSIS — I209 Angina pectoris, unspecified: Secondary | ICD-10-CM

## 2016-07-29 DIAGNOSIS — E782 Mixed hyperlipidemia: Secondary | ICD-10-CM | POA: Diagnosis present

## 2016-07-29 DIAGNOSIS — Z955 Presence of coronary angioplasty implant and graft: Secondary | ICD-10-CM

## 2016-07-29 DIAGNOSIS — I2582 Chronic total occlusion of coronary artery: Secondary | ICD-10-CM | POA: Diagnosis not present

## 2016-07-29 HISTORY — DX: Type 2 diabetes mellitus without complications: E11.9

## 2016-07-29 HISTORY — DX: Cardiac murmur, unspecified: R01.1

## 2016-07-29 HISTORY — DX: Personal history of urinary calculi: Z87.442

## 2016-07-29 HISTORY — DX: Gastro-esophageal reflux disease without esophagitis: K21.9

## 2016-07-29 HISTORY — DX: Unspecified osteoarthritis, unspecified site: M19.90

## 2016-07-29 HISTORY — DX: Personal history of other diseases of the digestive system: Z87.19

## 2016-07-29 HISTORY — PX: LEFT HEART CATH AND CORONARY ANGIOGRAPHY: CATH118249

## 2016-07-29 HISTORY — DX: Atherosclerotic heart disease of native coronary artery without angina pectoris: I25.10

## 2016-07-29 HISTORY — PX: CORONARY ANGIOPLASTY WITH STENT PLACEMENT: SHX49

## 2016-07-29 HISTORY — PX: CORONARY STENT INTERVENTION: CATH118234

## 2016-07-29 LAB — POCT ACTIVATED CLOTTING TIME
ACTIVATED CLOTTING TIME: 285 s
ACTIVATED CLOTTING TIME: 373 s
Activated Clotting Time: 285 seconds
Activated Clotting Time: 274 seconds

## 2016-07-29 LAB — GLUCOSE, CAPILLARY
GLUCOSE-CAPILLARY: 206 mg/dL — AB (ref 65–99)
Glucose-Capillary: 169 mg/dL — ABNORMAL HIGH (ref 65–99)
Glucose-Capillary: 143 mg/dL — ABNORMAL HIGH (ref 65–99)

## 2016-07-29 SURGERY — LEFT HEART CATH AND CORONARY ANGIOGRAPHY
Anesthesia: LOCAL

## 2016-07-29 MED ORDER — MIDAZOLAM HCL 2 MG/2ML IJ SOLN
INTRAMUSCULAR | Status: AC
  Filled 2016-07-29: qty 2

## 2016-07-29 MED ORDER — SODIUM CHLORIDE 0.9 % IV SOLN
INTRAVENOUS | Status: DC
  Administered 2016-07-29 (×2): via INTRAVENOUS

## 2016-07-29 MED ORDER — FENTANYL CITRATE (PF) 100 MCG/2ML IJ SOLN
INTRAMUSCULAR | Status: DC | PRN
  Administered 2016-07-29 (×3): 25 ug via INTRAVENOUS

## 2016-07-29 MED ORDER — NITROGLYCERIN 0.4 MG SL SUBL: 0.4000 mg | SUBLINGUAL_TABLET | SUBLINGUAL | Status: DC | PRN

## 2016-07-29 MED ORDER — METFORMIN HCL 500 MG PO TABS: 1000.0000 mg | ORAL_TABLET | Freq: Two times a day (BID) | ORAL | Status: DC

## 2016-07-29 MED ORDER — POLYETHYLENE GLYCOL 3350 17 G PO PACK: 17.0000 g | PACK | Freq: Every day | ORAL | Status: DC | PRN

## 2016-07-29 MED ORDER — TICAGRELOR 90 MG PO TABS
ORAL_TABLET | ORAL | Status: DC | PRN
  Administered 2016-07-29: 180 mg via ORAL

## 2016-07-29 MED ORDER — DOCUSATE SODIUM 100 MG PO CAPS
100.0000 mg | ORAL_CAPSULE | Freq: Every day | ORAL | Status: DC
  Administered 2016-07-29: 22:00:00 100 mg via ORAL
  Filled 2016-07-29 (×2): qty 1

## 2016-07-29 MED ORDER — IOPAMIDOL (ISOVUE-370) INJECTION 76%
INTRAVENOUS | Status: AC
  Filled 2016-07-29: qty 100

## 2016-07-29 MED ORDER — MIDAZOLAM HCL 2 MG/2ML IJ SOLN
INTRAMUSCULAR | Status: DC | PRN
  Administered 2016-07-29 (×2): 1 mg via INTRAVENOUS
  Administered 2016-07-29: 2 mg via INTRAVENOUS

## 2016-07-29 MED ORDER — HYDRALAZINE HCL 20 MG/ML IJ SOLN: 5.0000 mg | INTRAMUSCULAR | Status: DC | PRN

## 2016-07-29 MED ORDER — NITROGLYCERIN 1 MG/10 ML FOR IR/CATH LAB
INTRA_ARTERIAL | Status: AC
  Filled 2016-07-29: qty 10

## 2016-07-29 MED ORDER — PANTOPRAZOLE SODIUM 40 MG PO TBEC
40.0000 mg | DELAYED_RELEASE_TABLET | Freq: Two times a day (BID) | ORAL | Status: DC
  Filled 2016-07-29: qty 1

## 2016-07-29 MED ORDER — ASPIRIN 81 MG PO CHEW
CHEWABLE_TABLET | ORAL | Status: AC
  Administered 2016-07-29: 81 mg via ORAL
  Filled 2016-07-29: qty 1

## 2016-07-29 MED ORDER — FENTANYL CITRATE (PF) 100 MCG/2ML IJ SOLN
INTRAMUSCULAR | Status: AC
  Filled 2016-07-29: qty 2

## 2016-07-29 MED ORDER — HEPARIN (PORCINE) IN NACL 2-0.9 UNIT/ML-% IJ SOLN
INTRAMUSCULAR | Status: DC | PRN
  Administered 2016-07-29: 1500 mL

## 2016-07-29 MED ORDER — IOPAMIDOL (ISOVUE-370) INJECTION 76%
INTRAVENOUS | Status: DC | PRN
  Administered 2016-07-29: 260 mL via INTRA_ARTERIAL

## 2016-07-29 MED ORDER — ANGIOPLASTY BOOK
Freq: Once | Status: AC
  Administered 2016-07-29: 23:00:00
  Filled 2016-07-29: qty 1

## 2016-07-29 MED ORDER — SODIUM CHLORIDE 0.9% FLUSH: 3.0000 mL | Freq: Two times a day (BID) | INTRAVENOUS | Status: DC

## 2016-07-29 MED ORDER — LABETALOL HCL 5 MG/ML IV SOLN: 10.0000 mg | INTRAVENOUS | Status: DC | PRN

## 2016-07-29 MED ORDER — VERAPAMIL HCL 2.5 MG/ML IV SOLN
INTRAVENOUS | Status: DC | PRN
  Administered 2016-07-29: 10 mL via INTRA_ARTERIAL

## 2016-07-29 MED ORDER — LORATADINE 10 MG PO TABS: 10.0000 mg | ORAL_TABLET | Freq: Every day | ORAL | Status: DC | PRN

## 2016-07-29 MED ORDER — LIDOCAINE HCL (PF) 1 % IJ SOLN
INTRAMUSCULAR | Status: AC
  Filled 2016-07-29: qty 30

## 2016-07-29 MED ORDER — ASPIRIN EC 81 MG PO TBEC
81.0000 mg | DELAYED_RELEASE_TABLET | Freq: Every day | ORAL | Status: DC
  Administered 2016-07-29: 81 mg via ORAL
  Filled 2016-07-29: qty 1

## 2016-07-29 MED ORDER — HYDRALAZINE HCL 20 MG/ML IJ SOLN
INTRAMUSCULAR | Status: DC | PRN
  Administered 2016-07-29 (×2): 10 mg via INTRAVENOUS

## 2016-07-29 MED ORDER — NITROGLYCERIN 1 MG/10 ML FOR IR/CATH LAB
INTRA_ARTERIAL | Status: DC | PRN
  Administered 2016-07-29: 200 ug via INTRA_ARTERIAL
  Administered 2016-07-29: 200 ug via INTRACORONARY

## 2016-07-29 MED ORDER — SODIUM CHLORIDE 0.9 % IV SOLN: 250.0000 mL | INTRAVENOUS | Status: DC | PRN

## 2016-07-29 MED ORDER — INSULIN GLARGINE 100 UNIT/ML SOLOSTAR PEN: 18.0000 [IU] | PEN_INJECTOR | Freq: Every day | SUBCUTANEOUS | Status: DC

## 2016-07-29 MED ORDER — HEPARIN SODIUM (PORCINE) 1000 UNIT/ML IJ SOLN
INTRAMUSCULAR | Status: AC
  Filled 2016-07-29: qty 1

## 2016-07-29 MED ORDER — VERAPAMIL HCL 2.5 MG/ML IV SOLN
INTRAVENOUS | Status: AC
  Filled 2016-07-29: qty 2

## 2016-07-29 MED ORDER — LIDOCAINE HCL (PF) 1 % IJ SOLN
INTRAMUSCULAR | Status: DC | PRN
  Administered 2016-07-29: 12 mL
  Administered 2016-07-29: 2 mL

## 2016-07-29 MED ORDER — ATORVASTATIN CALCIUM 10 MG PO TABS
5.0000 mg | ORAL_TABLET | ORAL | Status: DC
  Administered 2016-07-29: 5 mg via ORAL
  Filled 2016-07-29: qty 1

## 2016-07-29 MED ORDER — ATROPINE SULFATE 1 MG/10ML IJ SOSY
PREFILLED_SYRINGE | INTRAMUSCULAR | Status: AC
  Filled 2016-07-29: qty 10

## 2016-07-29 MED ORDER — SODIUM CHLORIDE 0.9% FLUSH
3.0000 mL | Freq: Two times a day (BID) | INTRAVENOUS | Status: DC
  Administered 2016-07-29: 22:00:00 3 mL via INTRAVENOUS

## 2016-07-29 MED ORDER — SODIUM CHLORIDE 0.9 % WEIGHT BASED INFUSION
3.0000 mL/kg/h | INTRAVENOUS | Status: DC
  Administered 2016-07-29: 3 mL/kg/h via INTRAVENOUS

## 2016-07-29 MED ORDER — HYDRALAZINE HCL 20 MG/ML IJ SOLN
INTRAMUSCULAR | Status: AC
  Filled 2016-07-29: qty 1

## 2016-07-29 MED ORDER — INSULIN GLARGINE 100 UNIT/ML ~~LOC~~ SOLN
18.0000 [IU] | Freq: Every day | SUBCUTANEOUS | Status: DC
  Administered 2016-07-29: 22:00:00 18 [IU] via SUBCUTANEOUS
  Filled 2016-07-29: qty 0.18

## 2016-07-29 MED ORDER — TICAGRELOR 90 MG PO TABS
ORAL_TABLET | ORAL | Status: AC
  Filled 2016-07-29: qty 2

## 2016-07-29 MED ORDER — LISINOPRIL 20 MG PO TABS
20.0000 mg | ORAL_TABLET | Freq: Every day | ORAL | Status: DC
  Administered 2016-07-30: 20 mg via ORAL
  Filled 2016-07-29: qty 2
  Filled 2016-07-29: qty 1

## 2016-07-29 MED ORDER — SODIUM CHLORIDE 0.9 % WEIGHT BASED INFUSION: 1.0000 mL/kg/h | INTRAVENOUS | Status: DC

## 2016-07-29 MED ORDER — ACETAMINOPHEN 325 MG PO TABS: 325.0000 mg | ORAL_TABLET | Freq: Two times a day (BID) | ORAL | Status: DC | PRN

## 2016-07-29 MED ORDER — ASPIRIN 81 MG PO CHEW
81.0000 mg | CHEWABLE_TABLET | ORAL | Status: AC
  Administered 2016-07-29: 81 mg via ORAL

## 2016-07-29 MED ORDER — TICAGRELOR 90 MG PO TABS
90.0000 mg | ORAL_TABLET | Freq: Two times a day (BID) | ORAL | Status: DC
  Administered 2016-07-30 (×2): 90 mg via ORAL
  Filled 2016-07-29 (×2): qty 1

## 2016-07-29 MED ORDER — HEPARIN (PORCINE) IN NACL 2-0.9 UNIT/ML-% IJ SOLN
INTRAMUSCULAR | Status: AC
  Filled 2016-07-29: qty 1000

## 2016-07-29 MED ORDER — SODIUM CHLORIDE 0.9% FLUSH: 3.0000 mL | INTRAVENOUS | Status: DC | PRN

## 2016-07-29 MED ORDER — HEPARIN (PORCINE) IN NACL 2-0.9 UNIT/ML-% IJ SOLN
INTRAMUSCULAR | Status: DC | PRN
  Administered 2016-07-29: 10 mL via INTRA_ARTERIAL

## 2016-07-29 MED ORDER — ONDANSETRON HCL 4 MG/2ML IJ SOLN: 4.0000 mg | Freq: Four times a day (QID) | INTRAMUSCULAR | Status: DC | PRN

## 2016-07-29 MED ORDER — DIAZEPAM 5 MG PO TABS
5.0000 mg | ORAL_TABLET | Freq: Every evening | ORAL | Status: DC | PRN
  Administered 2016-07-29: 5 mg via ORAL
  Filled 2016-07-29: qty 1

## 2016-07-29 MED ORDER — HEPARIN SODIUM (PORCINE) 1000 UNIT/ML IJ SOLN
INTRAMUSCULAR | Status: DC | PRN
  Administered 2016-07-29: 4500 [IU] via INTRAVENOUS
  Administered 2016-07-29: 4000 [IU] via INTRAVENOUS
  Administered 2016-07-29 (×2): 2000 [IU] via INTRAVENOUS

## 2016-07-29 SURGICAL SUPPLY — 31 items
BALLN EMERGE MR 2.0X12 (BALLOONS) ×2
BALLN WOLVERINE 2.50X10 (BALLOONS) ×2
BALLN ~~LOC~~ EMERGE MR 2.5X12 (BALLOONS) ×2
BALLN ~~LOC~~ EUPHORA RX 3.0X12 (BALLOONS) ×2
BALLOON EMERGE MR 2.0X12 (BALLOONS) IMPLANT
BALLOON WOLVERINE 2.50X10 (BALLOONS) IMPLANT
BALLOON ~~LOC~~ EMERGE MR 2.5X12 (BALLOONS) IMPLANT
BALLOON ~~LOC~~ EUPHORA RX 3.0X12 (BALLOONS) IMPLANT
CATH INFINITI 4FR 3 DRC (CATHETERS) ×1 IMPLANT
CATH INFINITI JR4 5F (CATHETERS) ×1 IMPLANT
CATH LAUNCHER 5F JR4 (CATHETERS) ×1 IMPLANT
CATH OPTITORQUE TIG 4.0 5F (CATHETERS) ×1 IMPLANT
CATH VISTA GUIDE 6FR 3DRC (CATHETERS) ×1 IMPLANT
DEVICE CONTINUOUS FLUSH (MISCELLANEOUS) ×1 IMPLANT
DEVICE RAD COMP TR BAND LRG (VASCULAR PRODUCTS) ×1 IMPLANT
GLIDESHEATH SLEND A-KIT 6F 22G (SHEATH) ×1 IMPLANT
GUIDE CATH RUNWAY 6FR AR1 (CATHETERS) ×1 IMPLANT
GUIDEWIRE INQWIRE 1.5J.035X260 (WIRE) IMPLANT
INQWIRE 1.5J .035X260CM (WIRE) ×2
KIT ENCORE 26 ADVANTAGE (KITS) ×1 IMPLANT
KIT HEART LEFT (KITS) ×2 IMPLANT
PACK CARDIAC CATHETERIZATION (CUSTOM PROCEDURE TRAY) ×2 IMPLANT
SHEATH PINNACLE 6F 10CM (SHEATH) ×1 IMPLANT
STENT SYNERGY DES 2.5X28 (Permanent Stent) ×1 IMPLANT
SYR MEDRAD MARK V 150ML (SYRINGE) ×1 IMPLANT
TRANSDUCER W/STOPCOCK (MISCELLANEOUS) ×2 IMPLANT
TUBING CIL FLEX 10 FLL-RA (TUBING) ×2 IMPLANT
WIRE EMERALD 3MM-J .035X150CM (WIRE) ×1 IMPLANT
WIRE HI TORQ VERSACORE-J 145CM (WIRE) ×1 IMPLANT
WIRE HI TORQ WHISPER MS 190CM (WIRE) ×1 IMPLANT
WIRE MARVEL STR TIP 190CM (WIRE) ×1 IMPLANT

## 2016-07-29 NOTE — Interval H&P Note (Signed)
History and Physical Interval Note:  07/29/2016 2:55 PM  Marcus Norton  has presented today for surgery, with the diagnosis of angina - abnormal stress test.   The various methods of treatment have been discussed with the patient and family. After consideration of risks, benefits and other options for treatment, the patient has consented to  Procedure(s): Left Heart Cath and Coronary Angiography (N/A) with possible Percutaneous Coronary Intervention as a surgical intervention .  The patient's history has been reviewed, patient examined, no change in status, stable for surgery.  I have reviewed the patient's chart and labs.  Questions were answered to the patient's satisfaction.    Cath Lab Visit (complete for each Cath Lab visit)  Clinical Evaluation Leading to the Procedure:   ACS: No.  Non-ACS:    Anginal Classification: CCS III  Anti-ischemic medical therapy: Maximal Therapy (2 or more classes of medications)  Non-Invasive Test Results: Intermediate-risk stress test findings: cardiac mortality 1-3%/year  Prior CABG: No previous CABG   Glenetta Hew

## 2016-07-29 NOTE — H&P (View-Only) (Signed)
SUBJECTIVE: The patient presents to review results of stress testing. Nuclear stress test/17/18 demonstrated a moderate sized, moderate intensity defect in the apical to basal inferolateral wall which was partially reversible suggesting myocardial scar with a mild-to-moderate degree of peri-infarct ischemia. He was deemed an intermediate risk study. Calculated LVEF 43%.  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  He underwent coronary angiography on 05/09/2003 which only demonstrated luminal irregularities.  He tried diltiazem but could not tolerate it. He said it caused lightheadedness.  He continues to experience with right sided and retrosternal chest pain and a sense of abdominal fullness. He told me his colonoscopy was unremarkable. He is taking Protonix twice daily with only mild relief of chest discomfort. He said he has no energy and sometimes the chest pain "stops him in his tracks ".    Review of Systems: As per "subjective", otherwise negative.  Allergies  Allergen Reactions  . Plavix [Clopidogrel Bisulfate] Shortness Of Breath  . Oxycontin [Oxycodone Hcl] Other (See Comments)    Malaise  . Penicillins Other (See Comments)    [place] Has patient had a PCN reaction causing immediate rash, facial/tongue/throat swelling, SOB or lightheadedness with hypotension: No Has patient had a PCN reaction causing severe rash involving mucus membranes or skin necrosis: No Has patient had a PCN reaction that required hospitalization No Has patient had a PCN reaction occurring within the last 10 years: No If all of the above answers are "NO", then may proceed with Cephalosporin use.   . Pioglitazone Other (See Comments)    Made his chest hurt  . Prednisone Other (See Comments)    Reaction: muscle aches and  soreness  . Simvastatin Other (See Comments)    Myalgias    Current Outpatient Prescriptions  Medication Sig Dispense Refill  . acetaminophen (TYLENOL) 325 MG tablet Take 325 mg by mouth 2 (two) times daily as needed for mild pain.    [name] aspirin EC 81 MG tablet Take 81 mg by mouth at bedtime.     [name] atorvastatin (LIPITOR) 10 MG tablet Take 5 mg by mouth every other day.     . Cyanocobalamin (VITAMIN B 12 PO) Take 2,000 Units by mouth daily.     . diazepam (VALIUM) 5 MG tablet TAKE 1 TABLET BY MOUTH AT BEDTIME AS NEEDED FOR SLEEP  5  . docusate sodium (COLACE) 100 MG capsule Take 100 mg by mouth daily as needed for mild constipation.    [name] LANTUS SOLOSTAR 100 UNIT/ML Solostar Pen Inject 18 Units into the skin daily at 10 pm. 15 mL 11  . lisinopril (PRINIVIL,ZESTRIL) 20 MG tablet Take 1 tablet (20 mg total) by mouth daily. 30 tablet 6  . loratadine (CLARITIN) 10 MG tablet Take 10 mg by mouth daily as needed for allergies.     . metFORMIN (GLUCOPHAGE) 1000 MG tablet Take 1 tablet (1,000 mg total) by mouth 2 (two) times daily with a meal. 180 tablet 3  . nitroGLYCERIN (NITROSTAT) 0.4 MG SL tablet Place 1 tablet (0.4 mg total) under the tongue every 5 (five) minutes x 3 doses as needed. (Patient taking differently: Place 0.4 mg under the tongue every 5 (five) minutes x 3 doses as needed for chest pain. ) 30 tablet 3  . pantoprazole (PROTONIX) 40 MG tablet Take 1 tablet (40 mg total) by mouth 2 (two) times daily  before a meal. 180 tablet 0  . polyethylene glycol (MIRALAX / GLYCOLAX) packet Take 17 g by mouth daily.    . sitaGLIPtin (JANUVIA) 100 MG tablet Take 1 tablet (100 mg total) by mouth daily. 90 tablet 3   No current facility-administered medications for this visit.     Past Medical History:  Diagnosis Date  . 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 ASCVD (arteriosclerotic cardiovascular disease)    -luminal irregularities in 1998 and 2005; normal EF  . Benign prostatic hypertrophy     status post transurethral resection of the prostate  . Diabetes mellitus   . Dysphagia   . Erectile dysfunction   . Hyperlipidemia   . Hypertension   . Seasonal allergies   . Tobacco abuse    20 pack years; discontinued 1995    Past Surgical History:  Procedure Laterality Date  . APPENDECTOMY    . COLONOSCOPY  07/20/2011   Dr. Gala Romney: multiple hyperplastic polyps. Surveillance 2018  . COLONOSCOPY N/A 06/30/2016   Procedure: COLONOSCOPY;  Surgeon: Daneil Dolin, MD;  Location: [facility];  Service: Endoscopy;  Laterality: N/A;  10:00am  . COLONOSCOPY W/ BIOPSIES AND POLYPECTOMY  07/08/08, 06/2011   Dr Madolyn Frieze papilla, diminutive rectal polyp ablated the, left-sided diverticula, piecemeal polypectomy of hepatic flexure adenomatous polyp, diminutive polyps near hepatic flexure ablated  . ESOPHAGOGASTRODUODENOSCOPY  2013   erosive reflux esophagitis, s/p empiric dilation. chronic duodenitis.   [name] NASAL SEPTUM SURGERY     Correction of deviated septum  . PERIPHERAL VASCULAR CATHETERIZATION N/A 11/26/2015   Procedure: Abdominal Aortogram w/Lower Extremity;  Surgeon: Wellington Hampshire, MD;  Location: [facility];  Service: Cardiovascular;  Laterality: N/A;  . TONSILLECTOMY    . TRANSURETHRAL RESECTION OF PROSTATE  2009    Social History   Social History  . Marital status: Married    Spouse name: N/A  . Number of children: 2  . Years of education: N/A   Occupational History  . retired-farmer, [place]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     [employer]   Social History Main Topics  . Smoking status: Former Smoker    Packs/day: 1.00    Years: 20.00    Types: Cigarettes    Start date: 03/08/1965    Quit date: 07/07/1993  . Smokeless tobacco: Never Used  . Alcohol use 0.0 oz/week     Comment: occ; no alcohol this year  . Drug use: No  . Sexual activity: Yes   Other Topics Concern  . Not on file   Social History Narrative  . No narrative on file     Vitals:    07/27/16 0941  BP: 130/70  Pulse: 75  SpO2: 95%  Weight: 174 lb (78.9 kg)  Height: 5\' 7"  (1.702 m)    Wt Readings from Last 3 Encounters:  07/27/16 174 lb (78.9 kg)  07/15/16 177 lb (80.3 kg)  07/12/16 174 lb 6.4 oz (79.1 kg)     PHYSICAL EXAM General: NAD HEENT: Normal. Neck: No JVD, no thyromegaly. Lungs: Clear to auscultation bilaterally with normal respiratory effort. CV: Nondisplaced PMI.  Regular rate and rhythm, normal S1/S2, no Q7/H4, soft 1/6 systolic murmur over RUSB. No pretibial or periankle edema.  Abdomen: Soft, nontender, no distention.  Neurologic: Alert and oriented.  Psych: Normal affect. Skin: Normal. Musculoskeletal: No gross deformities.    ECG: Most recent ECG reviewed.   Labs: Lab Results  Component Value Date/Time   K 4.6 04/30/2016 08:42 PM  BUN 22 (H) 04/30/2016 08:42 PM   BUN 26 (A) 12/15/2015   CREATININE 1.02 04/30/2016 08:42 PM   CREATININE 0.91 11/18/2015 11:53 AM   ALT 31 03/04/2016 10:29 AM   TSH 1.85 03/04/2016 10:29 AM   HGB 13.4 04/30/2016 08:42 PM     Lipids: Lab Results  Component Value Date/Time   LDLCALC 86 03/04/2016 10:29 AM   LDLCALC 91 03/12/2008   CHOL 141 03/04/2016 10:29 AM   TRIG 63.0 03/04/2016 10:29 AM   TRIG 96 03/12/2008   HDL 42.80 03/04/2016 10:29 AM       ASSESSMENT AND PLAN:  1. Palpitations/sinus tachycardia/PSVT: Unable to tolerate diltiazem. A previous event monitor showed symptoms which correlated with sinus rhythm, sinus tachycardia with PACs, and one episode of paroxysmal SVT. Currently denies palpitations.  2. Essential HTN: Controlled today. No changes.  3. Nonobstructive CAD with angina: While symptoms appear somewhat atypical, he had significantly abnormal stress test results as detailed above and symptoms are thus concerning for angina. Protonix has only provided mild relief.  I will arrange for coronary angiography. Continue ASA and Lipitor. Continue diltiazem for both palpitations  and anginal discomfort. Risks and benefits of cardiac catheterization have been discussed with the patient.  These include bleeding, infection, kidney damage, stroke, heart attack, death.  The patient understands these risks and is willing to proceed.  4. Hyperlipidemia: LDL 86 on 03/04/16. On Lipitor 5 mg every other day, and managed by his PCP.   5. PVD: Continues to walk without difficulty. Continue ASA and Lipitor. He underwent bilateral common iliac artery stent placement in 2017. Follows with Dr. Fletcher Anon.    Disposition: Follow up 1 month.  Time spent: 40 minutes, of which greater than 50% was spent reviewing symptoms, relevant blood tests and studies, and discussing management plan with the patient.   Kate Sable, M.D., F.A.C.C.

## 2016-07-30 ENCOUNTER — Encounter (HOSPITAL_COMMUNITY): Payer: Self-pay | Admitting: Cardiology

## 2016-07-30 DIAGNOSIS — I739 Peripheral vascular disease, unspecified: Secondary | ICD-10-CM | POA: Diagnosis not present

## 2016-07-30 DIAGNOSIS — I25118 Atherosclerotic heart disease of native coronary artery with other forms of angina pectoris: Secondary | ICD-10-CM | POA: Diagnosis not present

## 2016-07-30 DIAGNOSIS — I10 Essential (primary) hypertension: Secondary | ICD-10-CM

## 2016-07-30 DIAGNOSIS — R9439 Abnormal result of other cardiovascular function study: Secondary | ICD-10-CM | POA: Diagnosis not present

## 2016-07-30 DIAGNOSIS — Z955 Presence of coronary angioplasty implant and graft: Secondary | ICD-10-CM

## 2016-07-30 DIAGNOSIS — I209 Angina pectoris, unspecified: Secondary | ICD-10-CM

## 2016-07-30 DIAGNOSIS — E784 Other hyperlipidemia: Secondary | ICD-10-CM

## 2016-07-30 DIAGNOSIS — I2582 Chronic total occlusion of coronary artery: Secondary | ICD-10-CM | POA: Diagnosis not present

## 2016-07-30 DIAGNOSIS — I2584 Coronary atherosclerosis due to calcified coronary lesion: Secondary | ICD-10-CM | POA: Diagnosis not present

## 2016-07-30 LAB — BASIC METABOLIC PANEL
ANION GAP: 9 (ref 5–15)
BUN: 15 mg/dL (ref 6–20)
CHLORIDE: 101 mmol/L (ref 101–111)
CO2: 22 mmol/L (ref 22–32)
Calcium: 8.6 mg/dL — ABNORMAL LOW (ref 8.9–10.3)
Creatinine, Ser: 0.8 mg/dL (ref 0.61–1.24)
GFR calc non Af Amer: >60 mL/min (ref >60)
GLUCOSE: 196 mg/dL — AB (ref 65–99)
POTASSIUM: 3.9 mmol/L (ref 3.5–5.1)
Sodium: 132 mmol/L — ABNORMAL LOW (ref 135–145)

## 2016-07-30 LAB — CBC
HEMATOCRIT: 39.4 % (ref 39.0–52.0)
Hemoglobin: 13.4 g/dL (ref 13.0–17.0)
MCH: 32.8 pg (ref 26.0–34.0)
MCHC: 34 g/dL (ref 30.0–36.0)
MCV: 96.6 fL (ref 78.0–100.0)
Platelets: 238 10*3/uL (ref 150–400)
RBC: 4.08 MIL/uL — AB (ref 4.22–5.81)
RDW: 12.9 % (ref 11.5–15.5)
WBC: 10.8 10*3/uL — ABNORMAL HIGH (ref 4.0–10.5)

## 2016-07-30 LAB — GLUCOSE, CAPILLARY: GLUCOSE-CAPILLARY: 232 mg/dL — AB (ref 65–99)

## 2016-07-30 MED ORDER — CARVEDILOL 3.125 MG PO TABS
3.1250 mg | ORAL_TABLET | Freq: Two times a day (BID) | ORAL | Status: DC
  Administered 2016-07-30: 11:00:00 3.125 mg via ORAL
  Filled 2016-07-30: qty 1

## 2016-07-30 MED ORDER — CARVEDILOL 3.125 MG PO TABS: 3.1250 mg | ORAL_TABLET | Freq: Two times a day (BID) | ORAL | 6 refills | Status: DC

## 2016-07-30 MED ORDER — TICAGRELOR 90 MG PO TABS: 90.0000 mg | ORAL_TABLET | Freq: Two times a day (BID) | ORAL | 3 refills | Status: DC

## 2016-07-30 NOTE — Care Management Note (Addendum)
Case Management Note  Patient Details  Name: Marcus Norton MRN: [id number] Date of Birth: 1940/05/23  Subjective/Objective:  From home with wife, pta indep, s/p coronary stent intervention, will be on brilinta, he states he will be going to [facility] in [place].  NCM will check to see if they have in stock also awaiting benefit check.  NCM called patient ,left message of how much copay will be 40.00 and also informed RN Mickel Baas who will also inform patient. [facility] in [place] has 10 pills in stock they will have to order the rest.    PCP [name]                       Action/Plan: NCM will follow for dc needs.   Expected Discharge Date:                  Expected Discharge Plan:  Home/Self Care  In-House Referral:     Discharge planning Services  CM Consult  Post Acute Care Choice:    Choice offered to:     DME Arranged:    DME Agency:     HH Arranged:    [place] Agency:     Status of Service:  Completed, signed off  If discussed at [employer] of Stay Meetings, dates discussed:    Additional Comments:  Zenon Mayo, RN 07/30/2016, 7:43 AM

## 2016-07-30 NOTE — Discharge Summary (Signed)
The patient has been seen in conjunction with Vin Bhagat, PAC. All aspects of care have been considered and discussed. The patient has been personally interviewed, examined, and all clinical data has been reviewed.   The patient underwent successful intervention on the ostial and proximal RCA with good angiographic result. No symptoms on ambulation.  Access site is unremarkable.  Aggressive risk factor modification with LDL less than 70, strict blood pressure control, and exercise or discussed.  Dual antiplatelet therapy for at least 6 months and possibly longer.  Transition of care follow-up in 7 days.   Discharge Summary    Patient ID: Marcus Norton,  MRN: [id number], DOB/AGE: 1940/07/06 76 y.o.  Admit date: 07/29/2016 Discharge date: 07/30/2016  Primary Care Provider: [name] Primary Cardiologist: [name]  Discharge Diagnoses    Principal Problem:   Abnormal nuclear stress test Active Problems:   Hyperlipidemia   Essential hypertension   PAD (peripheral artery disease) (HCC)   Atypical angina (HCC)   Angina pectoris ([place])  CAD  Allergies Allergies  Allergen Reactions  . Plavix [Clopidogrel Bisulfate] Shortness Of Breath  . Oxycontin [Oxycodone Hcl] Other (See Comments)    Malaise  . Penicillins Other (See Comments)    [place] Has patient had a PCN reaction causing immediate rash, facial/tongue/throat swelling, SOB or lightheadedness with hypotension: No Has patient had a PCN reaction causing severe rash involving mucus membranes or skin necrosis: No Has patient had a PCN reaction that required hospitalization No Has patient had a PCN reaction occurring within the last 10 years: No If all of the above answers are "NO", then may proceed with Cephalosporin use.   . Pioglitazone Other (See Comments)    Made his chest hurt  . Prednisone Other (See Comments)    Reaction: muscle aches and soreness  . Simvastatin Other (See Comments)   Myalgias    Diagnostic Studies/Procedures    Cath 07/29/16 Left Heart Cath and Coronary Angiography  Conclusion     Ost RCA lesion, 99 %stenosed (moderately calcified eccentric lesion). Prox RCA lesion, 70 %stenosed.  A STENT SYNERGY DES 2.5X28 drug eluting stent was successfully placed.  Post intervention, there is a 0% residual stenosis.  Very Distal 1st RPLB lesion, 100 %stenosed. Noted prior to Ostial Lesion PCI -- the occlusion site moved further distal with guidewire advancement  Ost LAD lesion, 40 %stenosed. Ost Cx to Prox Cx lesion, 50 %stenosed.  LV end diastolic pressure is moderately elevated.  Culprit lesion was a severe ostial RCA lesion followed by a more moderate lesion in the proximal segment. Both lesions covered with a single Synergy DES 2.5 mm x 20 mm stent a preferred from 3.1 mm diameter 2.75 mm -with an ostial flare. Complex procedure due to converting from radial access to femoral access, ostial lesion requiring 2 wires, multiple balloons including Cutting Balloon angioplasty followed by long stent placement to cover 2 lesions.  Plan: Overnight monitoring post PCI with TR band removal  Dual antiplatelet therapy for minimum one year  Aggressive blood pressure control with PRN medications written for.   Coronary Diagrams   Diagnostic Diagram       Post-Intervention Diagram            History of Present Illness     76 y.o. male with hx of HLD, HTN, PVD s/p bilateral common iliac artery stent placement in 2017 by Dr. Fletcher Anon, palpitations, PSVT (non tolerance to cardizem) who presented for outpatient cath.  Recently had atypical chest pain.  Nuclear stress test 06/08/16 demonstrated a moderate sized, moderate intensity defect in the apical to basal inferolateral wall which was partially reversible suggesting myocardial scar with a mild-to-moderate degree of peri-infarct ischemia. He was deemed an intermediate risk study. Calculated LVEF  43%.  Hospital Course     Consultants: None  1. Chest pain/abnormal stress test/CAD - Cath showed 99% ostial RCA & 70% pRCA s/p cutting balloon angioplasty and single SYNERGY DES 2.5X28. 100% very Distal 1st RPLB lesion --> the occlusion site moved further distal with guidewire advancement;  40% ostial LAD; 50% Ost Cx to Prox Cx lesion. - Continue DAPT for at least 1 year.   2. HTN - Elevated this admission. Continue lisinopril. Add coreg 3.125mg  BID.   3. HLD - 03/04/2016: Cholesterol 141; HDL 42.80; LDL Cholesterol 86; Triglycerides 63.0; VLDL 12.6  - myalgias on simvastatin. On lipitor 5mg  currently. Consider up titration as outpatient or lipid clinic referral.    Discharge Vitals Blood pressure (!) 163/61, pulse 71, temperature 97.6 F (36.4 C), temperature source Oral, resp. rate 18, height 5\' 7"  (1.702 m), weight 184 lb 4.9 oz (83.6 kg), SpO2 97 %.  Filed Weights   07/29/16 0950 07/30/16 0139  Weight: 174 lb (78.9 kg) 184 lb 4.9 oz (83.6 kg)    Labs & Radiologic Studies    CBC  Recent Labs  07/27/16 1037 07/30/16 0255  WBC 7.9 10.8*  NEUTROABS 5.4  --   HGB 15.0 13.4  HCT 44.0 39.4  MCV 97.6 96.6  PLT 246 242   Basic Metabolic Panel  Recent Labs  07/27/16 1037 07/30/16 0255  NA 135 132*  K 4.6 3.9  CL 99* 101  CO2 28 22  GLUCOSE 142* 196*  BUN 21* 15  CREATININE 0.83 0.80  CALCIUM 9.6 8.6*   Liver Function Tests No results for input(s): AST, ALT, ALKPHOS, BILITOT, PROT, ALBUMIN in the last 72 hours. No results for input(s): LIPASE, AMYLASE in the last 72 hours. Cardiac Enzymes No results for input(s): CKTOTAL, CKMB, CKMBINDEX, TROPONINI in the last 72 hours. BNP Invalid input(s): POCBNP D-Dimer No results for input(s): DDIMER in the last 72 hours. Hemoglobin A1C No results for input(s): HGBA1C in the last 72 hours. Fasting Lipid Panel No results for input(s): CHOL, HDL, LDLCALC, TRIG, CHOLHDL, LDLDIRECT in the last 72 hours. Thyroid Function  Tests No results for input(s): TSH, T4TOTAL, T3FREE, THYROIDAB in the last 72 hours.  Invalid input(s): FREET3 _____________  No results found. Disposition   Pt is being discharged home today in good condition.  Follow-up Plans & Appointments    Follow-up Information    Lendon Colonel, NP. Go on 08/26/2016.   Specialties:  Nurse Practitioner, Radiology, Cardiology Why:  @1 :00 post hospital  Contact information: [place] [address] [phone]          Discharge Instructions    AMB Referral to Cardiac Rehabilitation - Phase II    Complete by:  As directed    Diagnosis:   Stable Angina Coronary Stents     Amb Referral to Cardiac Rehabilitation    Complete by:  As directed    Diagnosis:  Coronary Stents   Diet - low sodium heart healthy    Complete by:  As directed    Discharge instructions    Complete by:  As directed    No driving for 48. No lifting over 5 lbs for 1 week. No sexual activity for 1  week. Keep procedure site clean & dry. If you notice increased pain, swelling, bleeding or pus, call/return!  You may shower, but no soaking baths/hot tubs/pools for 1 week.   Increase activity slowly    Complete by:  As directed       Discharge Medications   Current Discharge Medication List    START taking these medications   Details  carvedilol (COREG) 3.125 MG tablet Take 1 tablet (3.125 mg total) by mouth 2 (two) times daily with a meal. Qty: 60 tablet, Refills: 6    ticagrelor (BRILINTA) 90 MG TABS tablet Take 1 tablet (90 mg total) by mouth 2 (two) times daily. Qty: 180 tablet, Refills: 3      CONTINUE these medications which have NOT CHANGED   Details  acetaminophen (TYLENOL) 325 MG tablet Take 325 mg by mouth 2 (two) times daily as needed for mild pain.    aspirin EC 81 MG tablet Take 81 mg by mouth at bedtime.     atorvastatin (LIPITOR) 10 MG tablet Take 5 mg by mouth every other day.     Cyanocobalamin (VITAMIN B 12 PO) Take 2,000  Units by mouth daily.     diazepam (VALIUM) 5 MG tablet TAKE 1 TABLET BY MOUTH AT BEDTIME AS NEEDED FOR SLEEP Refills: 5    docusate sodium (COLACE) 100 MG capsule Take 100 mg by mouth daily.     LANTUS SOLOSTAR 100 UNIT/ML Solostar Pen Inject 18 Units into the skin daily at 10 pm. Qty: 15 mL, Refills: 11    lisinopril (PRINIVIL,ZESTRIL) 20 MG tablet Take 1 tablet (20 mg total) by mouth daily. Qty: 30 tablet, Refills: 6    loratadine (CLARITIN) 10 MG tablet Take 10 mg by mouth daily as needed for allergies.     metFORMIN (GLUCOPHAGE) 1000 MG tablet Take 1 tablet (1,000 mg total) by mouth 2 (two) times daily with a meal. Qty: 180 tablet, Refills: 3    nitroGLYCERIN (NITROSTAT) 0.4 MG SL tablet Place 1 tablet (0.4 mg total) under the tongue every 5 (five) minutes x 3 doses as needed. Qty: 30 tablet, Refills: 3    pantoprazole (PROTONIX) 40 MG tablet Take 1 tablet (40 mg total) by mouth 2 (two) times daily before a meal. Qty: 180 tablet, Refills: 0    polyethylene glycol (MIRALAX / GLYCOLAX) packet Take 17 g by mouth daily as needed for moderate constipation.     sitaGLIPtin (JANUVIA) 100 MG tablet Take 1 tablet (100 mg total) by mouth daily. Qty: 90 tablet, Refills: 3           Outstanding Labs/Studies   none  Duration of Discharge Encounter   Greater than 30 minutes including physician time.  Signed, Leanor Kail NP 07/30/2016, 10:15 AM

## 2016-07-30 NOTE — Progress Notes (Signed)
Progress Note  Patient Name: Marcus Norton Date of Encounter: 07/30/2016  Primary Cardiologist: [name]  Subjective   Feeling well. No chest pain, sob or palpitations.   Inpatient Medications    Scheduled Meds: . aspirin EC  81 mg Oral QHS  . atorvastatin  5 mg Oral QODAY  . docusate sodium  100 mg Oral Daily  . insulin glargine  18 Units Subcutaneous QHS  . lisinopril  20 mg Oral Daily  . [START ON 08/01/2016] metFORMIN  1,000 mg Oral BID WC  . pantoprazole  40 mg Oral BID AC  . sodium chloride flush  3 mL Intravenous Q12H  . ticagrelor  90 mg Oral BID   Continuous Infusions: . sodium chloride     PRN Meds: sodium chloride, acetaminophen, diazepam, loratadine, nitroGLYCERIN, ondansetron (ZOFRAN) IV, polyethylene glycol, sodium chloride flush   Vital Signs    Vitals:   07/30/16 0000 07/30/16 0100 07/30/16 0139 07/30/16 0715  BP: (!) 144/61 (!) 149/51  (!) 163/61  Pulse:   78 71  Resp: 18 18 18 18   Temp:   97.7 F (36.5 C) 97.6 F (36.4 C)  TempSrc:   Axillary Oral  SpO2: 97% 95% 98% 97%  Weight:   184 lb 4.9 oz (83.6 kg)   Height:        Intake/Output Summary (Last 24 hours) at 07/30/16 0922 Last data filed at 07/30/16 7425  Gross per 24 hour  Intake           983.33 ml  Output             1925 ml  Net          -941.67 ml   Filed Weights   07/29/16 0950 07/30/16 0139  Weight: 174 lb (78.9 kg) 184 lb 4.9 oz (83.6 kg)    Telemetry    NSR - Personally Reviewed  ECG    NSR with TWI in lateral lead - Personally Reviewed  Physical Exam   GEN: No acute distress.   Neck: No JVD Cardiac: RRR, no murmurs, rubs, or gallops.  Respiratory: Clear to auscultation bilaterally. GI: Soft, nontender, non-distended  MS: No edema; No deformity. Neuro:  Nonfocal  Psych: Normal affect   Labs    Chemistry Recent Labs Lab 07/27/16 1037 07/30/16 0255  NA 135 132*  K 4.6 3.9  CL 99* 101  CO2 28 22  GLUCOSE 142* 196*  BUN 21* 15  CREATININE  0.83 0.80  CALCIUM 9.6 8.6*  GFRNONAA >60 >60  GFRAA >60 >60  ANIONGAP 8 9     Hematology Recent Labs Lab 07/27/16 1037 07/30/16 0255  WBC 7.9 10.8*  RBC 4.51 4.08*  HGB 15.0 13.4  HCT 44.0 39.4  MCV 97.6 96.6  MCH 33.3 32.8  MCHC 34.1 34.0  RDW 12.6 12.9  PLT 246 238    Cardiac EnzymesNo results for input(s): TROPONINI in the last 168 hours. No results for input(s): TROPIPOC in the last 168 hours.   BNPNo results for input(s): BNP, PROBNP in the last 168 hours.   DDimer No results for input(s): DDIMER in the last 168 hours.   Radiology    No results found.  Cardiac Studies   Cath 07/29/16 Left Heart Cath and Coronary Angiography  Conclusion     Ost RCA lesion, 99 %stenosed (moderately calcified eccentric lesion). Prox RCA lesion, 70 %stenosed.  A STENT SYNERGY DES 2.5X28 drug eluting stent was successfully placed.  Post intervention, there is a  0% residual stenosis.  Very Distal 1st RPLB lesion, 100 %stenosed. Noted prior to Ostial Lesion PCI -- the occlusion site moved further distal with guidewire advancement  Ost LAD lesion, 40 %stenosed. Ost Cx to Prox Cx lesion, 50 %stenosed.  LV end diastolic pressure is moderately elevated.   Culprit lesion was a severe ostial RCA lesion followed by a more moderate lesion in the proximal segment. Both lesions covered with a single Synergy DES 2.5 mm x 20 mm stent a preferred from 3.1 mm diameter 2.75 mm -with an ostial flare. Complex procedure due to converting from radial access to femoral access, ostial lesion requiring 2 wires, multiple balloons including Cutting Balloon angioplasty followed by long stent placement to cover 2 lesions.  Plan: Overnight monitoring post PCI with TR band removal  Dual antiplatelet therapy for minimum one year  Aggressive blood pressure control with PRN medications written for.     Patient Profile     76 y.o. male with hx of HLD, HTN, PVD s/p bilateral common iliac artery stent  placement in 2017 by Dr. Fletcher Anon, palpitations, PSVT (non tolerance to cardizem) who presented for outpatient cath.   Recently had atypical chest pain.  Nuclear stress test 06/08/16 demonstrated a moderate sized, moderate intensity defect in the apical to basal inferolateral wall which was partially reversible suggesting myocardial scar with a mild-to-moderate degree of peri-infarct ischemia. He was deemed an intermediate risk study. Calculated LVEF 43%.  Assessment & Plan    1. Chest pain/abnormal stress test - Cath showed 99% ostial RCA & 70% pRCA s/p cutting balloon angioplasty and single SYNERGY DES 2.5X28. 100% very Distal 1st RPLB lesion --> the occlusion site moved further distal with guidewire advancement;  40% ostial LAD; 50% Ost Cx to Prox Cx lesion. - Continue DAPT for at least 1 year.   2. HTN - Elevated this admission. Continue lisinopril. Add coreg 3.125mg  BID.   3. HLD - 03/04/2016: Cholesterol 141; HDL 42.80; LDL Cholesterol 86; Triglycerides 63.0; VLDL 12.6  - myalgias on simvastatin. On lipitor 5mg  currently. Consider up titration as outpatient or lipid clinic referral.    Signed, Ciel Chervenak, PA  07/30/2016, 9:22 AM

## 2016-07-30 NOTE — Progress Notes (Signed)
TR BAND REMOVAL  LOCATION:    right radial  DEFLATED PER PROTOCOL:    Yes.    TIME BAND OFF / DRESSING APPLIED:    0100    SITE UPON ARRIVAL:    Level 0  SITE AFTER BAND REMOVAL:    Level 0  CIRCULATION SENSATION AND MOVEMENT:    Within Normal Limits   Yes.    COMMENTS:   Radial site teaching reinforced. Teach back done.

## 2016-07-30 NOTE — Progress Notes (Signed)
CARDIAC REHAB PHASE I   PRE:  Rate/Rhythm: 85 SR  BP:  Sitting: 152/58    MODE:  Ambulation: 450 ft   POST:  Rate/Rhythm: 102 ST  BP:  Sitting: 168/61       Pt ambulated 450 ft on RA, handheld assist, steady gait, tolerated well.  Pt c/o mild dizziness upon standing, improved with distance, denies cp, dizziness, declined rest stop. Completed PCI/stent education with pt and wife at bedside.  Reviewed risk factors, PCI book, anti-platelet therapy, stent card, activity restrictions, ntg, exercise, heart healthy and diabetes diet handouts and phase 2 cardiac rehab. Pt and wife verbalized understanding, receptive to education. Pt agrees to phase 2 cardiac rehab referral, will send to [facility] per pt request. Pt to recliner after walk, call bell within reach.  [place]ne, RN, BSN 07/30/2016 9:31 AM

## 2016-07-30 NOTE — Progress Notes (Signed)
S/W CHERYL @ [facility] # [phone]   BRILINTA 90 MG BID   COVER- YES  COP-AY- $ 40.00  TIER- 2 DRUG  PRIOR APPROVAL- NO   PHARMACY : [facility]

## 2016-07-30 NOTE — Progress Notes (Signed)
Site area: right groin  Site Prior to Removal:  Level 0  Pressure Applied For 40 MINUTES    Minutes Beginning at 2025  Manual:   Yes.    Patient Status During Pull:  stable  Post Pull Groin Site:  Level 0  Post Pull Instructions Given:  Yes.    Post Pull Pulses Present:  Yes.    Dressing Applied:  Yes.    Comments:  Pt tolerated well. Groin site teaching reinforced. Teach back done.

## 2016-08-02 ENCOUNTER — Telehealth: Payer: Self-pay | Admitting: Adult Health

## 2016-08-02 NOTE — Telephone Encounter (Signed)
Pt made aware to try to work through side effects. He voiced understanding.

## 2016-08-02 NOTE — Telephone Encounter (Signed)
Per pt phone call--believes he's having a reaction to some of his new meds. Please give him a call @ [phone]

## 2016-08-02 NOTE — Telephone Encounter (Signed)
As he just started taking these meds, I would wait for them to adjust before prematurely discontinuing or switching either of them, as they are both very important for his cardiac conditions.

## 2016-08-02 NOTE — Telephone Encounter (Signed)
Spoke with patient and he states that he is having a bad reaction to either the Brilinta or the coreg. He states that he started taking them both the same day, so he cannot tell which one it could be. He complains of "a weird feeling allover his body, like when they gave him the dye for his nuclear stress test." He said he feels weak. His bp is 148/72 HR 79. He is worried to continue these medications. Please advise.

## 2016-08-02 NOTE — Telephone Encounter (Signed)
Called pt, he was unavailable. His wife stated she will have him return call.

## 2016-08-04 LAB — POCT ACTIVATED CLOTTING TIME
ACTIVATED CLOTTING TIME: 197 s
ACTIVATED CLOTTING TIME: 147 s

## 2016-08-17 ENCOUNTER — Encounter: Payer: Self-pay | Admitting: Physician Assistant

## 2016-08-17 ENCOUNTER — Ambulatory Visit (INDEPENDENT_AMBULATORY_CARE_PROVIDER_SITE_OTHER): Payer: Medicare Other | Admitting: Physician Assistant

## 2016-08-17 VITALS — BP 118/62 | HR 103 | Temp 98.3°F | Resp 14 | Ht 67.0 in | Wt 172.0 lb

## 2016-08-17 DIAGNOSIS — N3001 Acute cystitis with hematuria: Secondary | ICD-10-CM | POA: Diagnosis not present

## 2016-08-17 LAB — POCT URINALYSIS DIPSTICK
Leukocytes, UA: NEGATIVE
Nitrite, UA: NEGATIVE
SPEC GRAV UA: 1.02 (ref 1.010–1.025)
UROBILINOGEN UA: 2 U/dL — AB
pH, UA: 6 (ref 5.0–8.0)

## 2016-08-17 LAB — BASIC METABOLIC PANEL
BUN: 22 mg/dL (ref 6–23)
CALCIUM: 9.6 mg/dL (ref 8.4–10.5)
CHLORIDE: 93 meq/L — AB (ref 96–112)
CO2: 27 mEq/L (ref 19–32)
CREATININE: 1.04 mg/dL (ref 0.40–1.50)
GFR: 73.52 mL/min (ref >60.00)
Glucose, Bld: 210 mg/dL — ABNORMAL HIGH (ref 70–99)
Potassium: 4.6 mEq/L (ref 3.5–5.1)
SODIUM: 129 meq/L — AB (ref 135–145)

## 2016-08-17 LAB — URINALYSIS, MICROSCOPIC ONLY

## 2016-08-17 NOTE — Progress Notes (Signed)
Patient presents to clinic today c/o 2 days of urinary urgency with dysuria and hematuria. Denies flank pain, abdominal pain, nausea or vomiting. Denies fever, chills, malaise. Has noted some intermittent urinary hesitancy. Denies rectal pain or change to bowel habits. Endorses chronic constipation that is currently treated with Miralax prn. Last bowel movement this morning. Patient was seen yesterday at Urgent Care was diagnosed with a UTI and started on Bactrim and Vesicare. Has taken as directed. Endorses symptoms improved today. Denies new symptoms.   Past Medical History:  Diagnosis Date  . 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 Arthritis    "lower back" (07/29/2016)  . ASCVD (arteriosclerotic cardiovascular disease)    -luminal irregularities in 1998 and 2005; normal EF  . Benign prostatic hypertrophy    status post transurethral resection of the prostate  . CAD (coronary artery disease)    a. Cath 07/29/16 99% ostial RCA & 70% pRCA s/p cutting balloon angioplasty and single SYNERGY DES 2.5X28. 100% very Distal 1st RPLB lesion --> the occlusion site moved further distal with guidewire advancement;  40% ostial LAD; 50% Ost Cx to Prox Cx lesion.  . Coronary artery disease   . Dysphagia   . Erectile dysfunction   . GERD (gastroesophageal reflux disease)   . Heart murmur   . History of hiatal hernia   . History of kidney stones   . Hyperlipidemia   . Hypertension   . Seasonal allergies   . Tobacco abuse    20 pack years; discontinued 1995  . Type II diabetes mellitus ([place])     Current Outpatient Prescriptions on File Prior to Visit  Medication Sig Dispense Refill  . acetaminophen (TYLENOL) 325 MG tablet Take 325 mg by mouth 2 (two) times daily as needed for mild pain.    [name] aspirin EC 81 MG tablet Take 81 mg by mouth at bedtime.     [name] atorvastatin (LIPITOR) 10 MG tablet Take 5 mg by mouth every other day.     . carvedilol (COREG) 3.125 MG tablet Take 1 tablet (3.125 mg total) by mouth  2 (two) times daily with a meal. 60 tablet 6  . Cyanocobalamin (VITAMIN B 12 PO) Take 2,000 Units by mouth daily.     . diazepam (VALIUM) 5 MG tablet TAKE 1 TABLET BY MOUTH AT BEDTIME AS NEEDED FOR SLEEP  5  . docusate sodium (COLACE) 100 MG capsule Take 100 mg by mouth daily.     [name] LANTUS SOLOSTAR 100 UNIT/ML Solostar Pen Inject 18 Units into the skin daily at 10 pm. 15 mL 11  . lisinopril (PRINIVIL,ZESTRIL) 20 MG tablet Take 1 tablet (20 mg total) by mouth daily. 30 tablet 6  . loratadine (CLARITIN) 10 MG tablet Take 10 mg by mouth daily as needed for allergies.     . metFORMIN (GLUCOPHAGE) 1000 MG tablet Take 1 tablet (1,000 mg total) by mouth 2 (two) times daily with a meal. 180 tablet 3  . nitroGLYCERIN (NITROSTAT) 0.4 MG SL tablet Place 1 tablet (0.4 mg total) under the tongue every 5 (five) minutes x 3 doses as needed. (Patient taking differently: Place 0.4 mg under the tongue every 5 (five) minutes x 3 doses as needed for chest pain. ) 30 tablet 3  . pantoprazole (PROTONIX) 40 MG tablet Take 1 tablet (40 mg total) by mouth 2 (two) times daily before a meal. 180 tablet 0  . polyethylene glycol (MIRALAX / GLYCOLAX) packet Take 17 g by mouth daily as needed for  moderate constipation.     . sitaGLIPtin (JANUVIA) 100 MG tablet Take 1 tablet (100 mg total) by mouth daily. 90 tablet 3  . ticagrelor (BRILINTA) 90 MG TABS tablet Take 1 tablet (90 mg total) by mouth 2 (two) times daily. 180 tablet 3   No current facility-administered medications on file prior to visit.     Allergies  Allergen Reactions  . Plavix [Clopidogrel Bisulfate] Shortness Of Breath  . Oxycontin [Oxycodone Hcl] Other (See Comments)    Malaise  . Penicillins Other (See Comments)    [place] Has patient had a PCN reaction causing immediate rash, facial/tongue/throat swelling, SOB or lightheadedness with hypotension: No Has patient had a PCN reaction causing severe rash involving mucus membranes or skin necrosis:  No Has patient had a PCN reaction that required hospitalization No Has patient had a PCN reaction occurring within the last 10 years: No If all of the above answers are "NO", then may proceed with Cephalosporin use.   . Pioglitazone Other (See Comments)    Made his chest hurt  . Prednisone Other (See Comments)    Reaction: muscle aches and soreness  . Simvastatin Other (See Comments)    Myalgias    Family History  Problem Relation Age of Onset  . Ovarian cancer Mother   . Colon cancer Sister 4       deceased from colon cancer    Social History   Social History  . Marital status: Married    Spouse name: N/A  . Number of children: 2  . Years of education: N/A   Occupational History  . retired-farmer, 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   Social History Main Topics  . Smoking status: Former Smoker    Packs/day: 1.00    Years: 20.00    Types: Cigarettes    Start date: 03/08/1965    Quit date: 07/07/1993  . Smokeless tobacco: Never Used  . Alcohol use 0.0 oz/week     Comment: 07/29/2016 "last drink was 4 wks ago; had a beer"  . Drug use: No  . Sexual activity: Not Currently   Other Topics Concern  . None   Social History Narrative  . None   Review of Systems - See HPI.  All other ROS are negative.  BP 118/62   Pulse (!) 103   Temp 98.3 F (36.8 C) (Oral)   Resp 14   Ht 5' 7" (1.702 m)   Wt 172 lb (78 kg)   SpO2 97%   BMI 26.94 kg/m   Physical Exam  Constitutional: He is oriented to person, place, and time and well-developed, well-nourished, and in no distress.  HENT:  Head: Normocephalic and atraumatic.  Eyes: Conjunctivae are normal.  Cardiovascular: Normal rate, regular rhythm, normal heart sounds and intact distal pulses.   Pulmonary/Chest: Effort normal and breath sounds normal. No respiratory distress. He has no wheezes. He has no rales. He exhibits no tenderness.  Abdominal: Soft. Bowel sounds are normal. He exhibits no  distension. There is no tenderness.  Negative CVA tenderness.   Neurological: He is alert and oriented to person, place, and time.  Skin: Skin is warm and dry. No rash noted.  Psychiatric: Affect normal.  Vitals reviewed.   Recent Results (from the past 2160 hour(s))  HM DIABETES EYE EXAM     Status: Abnormal   Collection Time: 05/27/16 12:00 AM  Result Value Ref Range   HM Diabetic Eye Exam Retinopathy (A) No  Retinopathy  NM Myocar Multi W/Spect W/Wall Motion / EF     Status: None   Collection Time: 06/08/16 11:11 AM  Result Value Ref Range   Rest HR 83 bpm   Rest BP 140/78 mmHg   Peak HR 111 bpm   Peak BP 176/72 mmHg   SSS 14    SRS 7    SDS 7    LHR 0.28    TID 0.91    LV sys vol 36 mL   LV dias vol 64 62 - 150 mL  POCT Urinalysis Dipstick     Status: Abnormal   Collection Time: 06/21/16  4:23 PM  Result Value Ref Range   Color, UA yellow    Clarity, UA clear    Glucose, UA 2+    Bilirubin, UA negative    Ketones, UA negative    Spec Grav, UA >=1.030 (A) 1.010 - 1.025   Blood, UA 1+    pH, UA 6.0 5.0 - 8.0   Protein, UA negative    Urobilinogen, UA 0.2 0.2 or 1.0 E.U./dL   Nitrite, UA negative    Leukocytes, UA Negative Negative  Urine Microscopic Only     Status: Abnormal   Collection Time: 06/21/16  4:37 PM  Result Value Ref Range   WBC, UA 0-2/hpf 0-2/hpf   RBC / HPF 0-2/hpf 0-2/hpf   Ca Oxalate Crys, UA Presence of (A) None  Urine Culture     Status: None   Collection Time: 06/21/16  4:37 PM  Result Value Ref Range   Organism ID, Bacteria NO GROWTH   Glucose, capillary     Status: Abnormal   Collection Time: 06/30/16  9:22 AM  Result Value Ref Range   Glucose-Capillary 130 (H) 65 - 99 mg/dL  POCT HgB A1C     Status: None   Collection Time: 07/15/16  9:46 AM  Result Value Ref Range   Hemoglobin A1C 6.7   CBC with Differential/Platelet     Status: None   Collection Time: 07/27/16 10:37 AM  Result Value Ref Range   WBC 7.9 4.0 - 10.5 K/uL   RBC 4.51  4.22 - 5.81 MIL/uL   Hemoglobin 15.0 13.0 - 17.0 g/dL   HCT 44.0 39.0 - 52.0 %   MCV 97.6 78.0 - 100.0 fL   MCH 33.3 26.0 - 34.0 pg   MCHC 34.1 30.0 - 36.0 g/dL   RDW 12.6 11.5 - 15.5 %   Platelets 246 150 - 400 K/uL   Neutrophils Relative % 67 %   Neutro Abs 5.4 1.7 - 7.7 K/uL   Lymphocytes Relative 22 %   Lymphs Abs 1.7 0.7 - 4.0 K/uL   Monocytes Relative 7 %   Monocytes Absolute 0.5 0.1 - 1.0 K/uL   Eosinophils Relative 3 %   Eosinophils Absolute 0.3 0.0 - 0.7 K/uL   Basophils Relative 1 %   Basophils Absolute 0.0 0.0 - 0.1 K/uL  Protime-INR     Status: None   Collection Time: 07/27/16 10:37 AM  Result Value Ref Range   Prothrombin Time 12.8 11.4 - 15.2 seconds   INR 0.86   Basic metabolic panel     Status: Abnormal   Collection Time: 07/27/16 10:37 AM  Result Value Ref Range   Sodium 135 135 - 145 mmol/L   Potassium 4.6 3.5 - 5.1 mmol/L   Chloride 99 (L) 101 - 111 mmol/L   CO2 28 22 - 32 mmol/L   Glucose, Bld 142 (H)  65 - 99 mg/dL   BUN 21 (H) 6 - 20 mg/dL   Creatinine, Ser 0.83 0.61 - 1.24 mg/dL   Calcium 9.6 8.9 - 10.3 mg/dL   GFR calc non Af Amer >60 >60 mL/min   GFR calc Af Amer >60 >60 mL/min    Comment: (NOTE) The eGFR has been calculated using the CKD EPI equation. This calculation has not been validated in all clinical situations. eGFR's persistently <60 mL/min signify possible Chronic Kidney Disease.    Anion gap 8 5 - 15  Glucose, capillary     Status: Abnormal   Collection Time: 07/29/16  9:53 AM  Result Value Ref Range   Glucose-Capillary 169 (H) 65 - 99 mg/dL  Glucose, capillary     Status: Abnormal   Collection Time: 07/29/16 12:33 PM  Result Value Ref Range   Glucose-Capillary 143 (H) 65 - 99 mg/dL  POCT Activated clotting time     Status: None   Collection Time: 07/29/16  4:23 PM  Result Value Ref Range   Activated Clotting Time 285 seconds  POCT Activated clotting time     Status: None   Collection Time: 07/29/16  4:35 PM  Result Value Ref  Range   Activated Clotting Time 373 seconds  POCT Activated clotting time     Status: None   Collection Time: 07/29/16  5:02 PM  Result Value Ref Range   Activated Clotting Time 285 seconds  POCT Activated clotting time     Status: None   Collection Time: 07/29/16  5:37 PM  Result Value Ref Range   Activated Clotting Time 274 seconds  POCT Activated clotting time     Status: None   Collection Time: 07/29/16  7:01 PM  Result Value Ref Range   Activated Clotting Time 197 seconds  POCT Activated clotting time     Status: None   Collection Time: 07/29/16  8:07 PM  Result Value Ref Range   Activated Clotting Time 147 seconds  Glucose, capillary     Status: Abnormal   Collection Time: 07/29/16  9:07 PM  Result Value Ref Range   Glucose-Capillary 206 (H) 65 - 99 mg/dL  Basic metabolic panel     Status: Abnormal   Collection Time: 07/30/16  2:55 AM  Result Value Ref Range   Sodium 132 (L) 135 - 145 mmol/L   Potassium 3.9 3.5 - 5.1 mmol/L   Chloride 101 101 - 111 mmol/L   CO2 22 22 - 32 mmol/L   Glucose, Bld 196 (H) 65 - 99 mg/dL   BUN 15 6 - 20 mg/dL   Creatinine, Ser 0.80 0.61 - 1.24 mg/dL   Calcium 8.6 (L) 8.9 - 10.3 mg/dL   GFR calc non Af Amer >60 >60 mL/min   GFR calc Af Amer >60 >60 mL/min    Comment: (NOTE) The eGFR has been calculated using the CKD EPI equation. This calculation has not been validated in all clinical situations. eGFR's persistently <60 mL/min signify possible Chronic Kidney Disease.    Anion gap 9 5 - 15  CBC     Status: Abnormal   Collection Time: 07/30/16  2:55 AM  Result Value Ref Range   WBC 10.8 (H) 4.0 - 10.5 K/uL   RBC 4.08 (L) 4.22 - 5.81 MIL/uL   Hemoglobin 13.4 13.0 - 17.0 g/dL   HCT 39.4 39.0 - 52.0 %   MCV 96.6 78.0 - 100.0 fL   MCH 32.8 26.0 - 34.0 pg   MCHC 34.0  30.0 - 36.0 g/dL   RDW 12.9 11.5 - 15.5 %   Platelets 238 150 - 400 K/uL  Glucose, capillary     Status: Abnormal   Collection Time: 07/30/16  5:51 AM  Result Value Ref  Range   Glucose-Capillary 232 (H) 65 - 99 mg/dL    Assessment/Plan: 1. Acute cystitis with hematuria Continue Bactrim. Urine dip today with proteinuria noted. Will check BMP, urine micro. Culture sent. Stop Vesicare as it is not indicated for UTI treatment and can worsen constipation. Will alter regimen based on results.   - Basic metabolic panel - POCT Urinalysis Dipstick - Urine Culture - Urine Microscopic Only   Leeanne Rio, PA-C

## 2016-08-17 NOTE — Progress Notes (Signed)
Pre visit review using our clinic review tool, if applicable. No additional management support is needed unless otherwise documented below in the visit note. 

## 2016-08-17 NOTE — Patient Instructions (Signed)
Please stay well-hydrated.  Continue the antibiotic. Stop the Vesicare.  Go the lab for blood work. I will call you with your results. We will alter treatment based on these results.  If you note any worsening ability to urinate, please go to the ER.

## 2016-08-18 LAB — URINE CULTURE: Organism ID, Bacteria: NO GROWTH

## 2016-08-26 ENCOUNTER — Encounter: Payer: Self-pay | Admitting: Adult Health

## 2016-08-26 ENCOUNTER — Ambulatory Visit (INDEPENDENT_AMBULATORY_CARE_PROVIDER_SITE_OTHER): Payer: Medicare Other | Admitting: Adult Health

## 2016-08-26 VITALS — BP 118/64 | HR 84 | Ht 67.0 in | Wt 174.0 lb

## 2016-08-26 DIAGNOSIS — E118 Type 2 diabetes mellitus with unspecified complications: Secondary | ICD-10-CM | POA: Diagnosis not present

## 2016-08-26 DIAGNOSIS — I10 Essential (primary) hypertension: Secondary | ICD-10-CM

## 2016-08-26 DIAGNOSIS — K219 Gastro-esophageal reflux disease without esophagitis: Secondary | ICD-10-CM | POA: Diagnosis not present

## 2016-08-26 DIAGNOSIS — I2510 Atherosclerotic heart disease of native coronary artery without angina pectoris: Secondary | ICD-10-CM

## 2016-08-26 MED ORDER — LISINOPRIL 10 MG PO TABS: 10.0000 mg | ORAL_TABLET | Freq: Every day | ORAL | 3 refills | Status: DC

## 2016-08-26 NOTE — Progress Notes (Signed)
Cardiology Office Note   Date:  08/26/2016   ID:  [name], [name] 1940/07/08, MRN [id number]  PCP:  [name] PA-C  Cardiologist: Bronson Ing  Chief Complaint  Patient presents with  . Hospitalization Follow-up  . Hypertension      History of Present Illness: Marcus Norton is a 76 y.o. male who presents for pos thospitalization for cardiac catheterization in the setting of abnormal nuclear medicine stress test. Patient had cardiac catheterization on 07/29/2016:  Cath 07/29/16 Left Heart Cath and Coronary Angiography  Conclusion     Ost RCA lesion, 99 %stenosed (moderately calcified eccentric lesion). Prox RCA lesion, 70 %stenosed.  A STENT SYNERGY DES 2.5X28 drug eluting stent was successfully placed.  Post intervention, there is a 0% residual stenosis.  Very Distal 1st RPLB lesion, 100 %stenosed. Noted prior to Ostial Lesion PCI -- the occlusion site moved further distal with guidewire advancement  Ost LAD lesion, 40 %stenosed. Ost Cx to Prox Cx lesion, 50 %stenosed.  LV end diastolic pressure is moderately elevated.  Culprit lesion was a severe ostial RCA lesion followed by a more moderate lesion in the proximal segment. Both lesions covered with a single Synergy DES 2.5 mm x 20 mm stent a preferred from 3.1 mm diameter 2.75 mm -with an ostial flare. Complex procedure due to converting from radial access to femoral access, ostial lesion requiring 2 wires, multiple balloons including Cutting Balloon angioplasty followed by long stent placement to cover 2 lesions.   The patient will now be on dual antiplatelet therapy for one year, statin therapy, carvedilol 3.125 mg twice a day was added, the patient was to continue lisinopril. The patient did call our office on 08/02/2016 complaining that he thought he was having a bad reactions Brilinta or coronary. States that he had a weird feeling all over his body with weakness. He was advised to continue to take the  medications.  He comes today with continued complaints of fatigue. He has some dyspnea after taking Brilinta. He had been on Plavix in the past, but did not tolerate this either, due to feelings of impending doom when he took it.   He is currently being treated for UTI by PCP, and wonders if this may be causing his fatigue. Recently started on doxycycline.   Past Medical History:  Diagnosis Date  . 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 Arthritis    "lower back" (07/29/2016)  . ASCVD (arteriosclerotic cardiovascular disease)    -luminal irregularities in 1998 and 2005; normal EF  . Benign prostatic hypertrophy    status post transurethral resection of the prostate  . CAD (coronary artery disease)    a. Cath 07/29/16 99% ostial RCA & 70% pRCA s/p cutting balloon angioplasty and single SYNERGY DES 2.5X28. 100% very Distal 1st RPLB lesion --> the occlusion site moved further distal with guidewire advancement;  40% ostial LAD; 50% Ost Cx to Prox Cx lesion.  . Coronary artery disease   . Dysphagia   . Erectile dysfunction   . GERD (gastroesophageal reflux disease)   . Heart murmur   . History of hiatal hernia   . History of kidney stones   . Hyperlipidemia   . Hypertension   . Seasonal allergies   . Tobacco abuse    20 pack years; discontinued 1995  . Type II diabetes mellitus ([place])     Past Surgical History:  Procedure Laterality Date  . APPENDECTOMY    . CATARACT EXTRACTION W/ INTRAOCULAR LENS  IMPLANT,  BILATERAL Bilateral   . COLONOSCOPY  07/20/2011   Dr. Gala Romney: multiple hyperplastic polyps. Surveillance 2018  . COLONOSCOPY N/A 06/30/2016   Procedure: COLONOSCOPY;  Surgeon: Daneil Dolin, MD;  Location: [facility];  Service: Endoscopy;  Laterality: N/A;  10:00am  . COLONOSCOPY W/ BIOPSIES AND POLYPECTOMY  07/08/08, 06/2011   Dr Madolyn Frieze papilla, diminutive rectal polyp ablated the, left-sided diverticula, piecemeal polypectomy of hepatic flexure adenomatous polyp, diminutive  polyps near hepatic flexure ablated  . CORONARY ANGIOPLASTY WITH STENT PLACEMENT  07/29/2016  . CORONARY STENT INTERVENTION N/A 07/29/2016   Procedure: Coronary Stent Intervention;  Surgeon: Leonie Man, MD;  Location: [place] [facility];  Service: Cardiovascular;  Laterality: N/A;  RCA  . ESOPHAGOGASTRODUODENOSCOPY  2013   erosive reflux esophagitis, s/p empiric dilation. chronic duodenitis.   [name] LEFT HEART CATH AND CORONARY ANGIOGRAPHY N/A 07/29/2016   Procedure: Left Heart Cath and Coronary Angiography;  Surgeon: Leonie Man, MD;  Location: [place] [facility];  Service: Cardiovascular;  Laterality: N/A;  . NASAL SEPTUM SURGERY    . NASAL SINUS SURGERY     "cleaned out my sinus"  . PERIPHERAL VASCULAR CATHETERIZATION N/A 11/26/2015   Procedure: Abdominal Aortogram w/Lower Extremity;  Surgeon: Wellington Hampshire, MD;  Location: [facility];  Service: Cardiovascular;  Laterality: N/A;  . TONSILLECTOMY    . TRANSURETHRAL RESECTION OF PROSTATE  2009     Current Outpatient Prescriptions  Medication Sig Dispense Refill  . acetaminophen (TYLENOL) 325 MG tablet Take 325 mg by mouth 2 (two) times daily as needed for mild pain.    [name] aspirin EC 81 MG tablet Take 81 mg by mouth at bedtime.     [name] atorvastatin (LIPITOR) 10 MG tablet Take 5 mg by mouth every other day.     . carvedilol (COREG) 3.125 MG tablet Take 1 tablet (3.125 mg total) by mouth 2 (two) times daily with a meal. 60 tablet 6  . Cyanocobalamin (VITAMIN B 12 PO) Take 2,000 Units by mouth daily.     . diazepam (VALIUM) 5 MG tablet TAKE 1 TABLET BY MOUTH AT BEDTIME AS NEEDED FOR SLEEP  5  . docusate sodium (COLACE) 100 MG capsule Take 100 mg by mouth daily.     [name] LANTUS SOLOSTAR 100 UNIT/ML Solostar Pen Inject 18 Units into the skin daily at 10 pm. 15 mL 11  . lisinopril (PRINIVIL,ZESTRIL) 20 MG tablet Take 1 tablet (20 mg total) by mouth daily. 30 tablet 6  . loratadine (CLARITIN) 10 MG tablet Take 10 mg by mouth daily as  needed for allergies.     . metFORMIN (GLUCOPHAGE) 1000 MG tablet Take 1 tablet (1,000 mg total) by mouth 2 (two) times daily with a meal. 180 tablet 3  . nitroGLYCERIN (NITROSTAT) 0.4 MG SL tablet Place 1 tablet (0.4 mg total) under the tongue every 5 (five) minutes x 3 doses as needed. (Patient taking differently: Place 0.4 mg under the tongue every 5 (five) minutes x 3 doses as needed for chest pain. ) 30 tablet 3  . pantoprazole (PROTONIX) 40 MG tablet Take 40 mg by mouth daily.    . polyethylene glycol (MIRALAX / GLYCOLAX) packet Take 17 g by mouth daily as needed for moderate constipation.     . sitaGLIPtin (JANUVIA) 100 MG tablet Take 1 tablet (100 mg total) by mouth daily. 90 tablet 3  . ticagrelor (BRILINTA) 90 MG TABS tablet Take 1 tablet (90 mg total) by mouth 2 (two)  times daily. 180 tablet 3  . doxycycline (VIBRA-TABS) 100 MG tablet TK 1 T PO BID  0   No current facility-administered medications for this visit.     Allergies:   Plavix [clopidogrel bisulfate]; Oxycontin [oxycodone hcl]; Penicillins; Pioglitazone; Prednisone; and Simvastatin    Social History:  The patient  reports that he quit smoking about 23 years ago. His smoking use included Cigarettes. He started smoking about 51 years ago. He has a 20.00 pack-year smoking history. He has never used smokeless tobacco. He reports that he drinks alcohol. He reports that he does not use drugs.   Family History:  The patient's family history includes Colon cancer (age of onset: 67) in his sister; Ovarian cancer in his mother.    ROS: All other systems are reviewed and negative. Unless otherwise mentioned in H&P    PHYSICAL EXAM: VS:  BP 118/64   Pulse 84   Ht 5\' 7"  (1.702 m)   Wt 174 lb (78.9 kg)   SpO2 96%   BMI 27.25 kg/m  , BMI Body mass index is 27.25 kg/m. GEN: Well nourished, well developed, in no acute distress  HEENT: normal  Neck: no JVD, carotid bruits, or masses Cardiac: RRR; no murmurs, rubs, or gallops,no  edema  Respiratory:  clear to auscultation bilaterally, normal work of breathing GI: soft, nontender, nondistended, + BS MS: no deformity or atrophy NO excessive bruising, or pain at right groin catheter insertion site.  Skin: warm and dry, no rash Neuro:  Strength and sensation are intact Psych: euthymic mood, full affect. Very talkative.    Recent Labs: 03/04/2016: ALT 31; TSH 1.85 07/30/2016: Hemoglobin 13.4; Platelets 238 08/17/2016: BUN 22; Creatinine, Ser 1.04; Potassium 4.6; Sodium 129    Lipid Panel    Component Value Date/Time   CHOL 141 03/04/2016 1029   TRIG 63.0 03/04/2016 1029   TRIG 96 03/12/2008   HDL 42.80 03/04/2016 1029   CHOLHDL 3 03/04/2016 1029   VLDL 12.6 03/04/2016 1029   LDLCALC 86 03/04/2016 1029   LDLCALC 91 03/12/2008      Wt Readings from Last 3 Encounters:  08/26/16 174 lb (78.9 kg)  08/17/16 172 lb (78 kg)  07/30/16 184 lb 4.9 oz (83.6 kg)    Other studies Reviewed:  Echocardiogram 12/01/2015 Left ventricle: The cavity size was normal. There was mild focal   basal hypertrophy of the septum. Systolic function was normal.   The estimated ejection fraction was in the range of 60% to 65%.   There was dynamic obstruction. There was dynamic obstruction,   with a peak velocity of 139 cm/sec and a peak gradient of 8 mm   Hg. Wall motion was normal; there were no regional wall motion   abnormalities. Doppler parameters are consistent with abnormal   left ventricular relaxation (grade 1 diastolic dysfunction).   Doppler parameters are consistent with indeterminate ventricular   filling pressure. - Aortic valve: Valve mobility was restricted. Sclerosis without   stenosis. Transvalvular velocity was within the normal range.   There was no stenosis. There was no regurgitation. Peak velocity   (S): 170 cm/s. Mean gradient (S): 7 mm Hg. - Mitral valve: Transvalvular velocity was within the normal range.   There was no evidence for stenosis. There was no  regurgitation. - Right ventricle: The cavity size was normal. Wall thickness was   normal. Systolic function was normal. - Tricuspid valve: There was no regurgitation.  ASSESSMENT AND PLAN:  1.  CAD: S/P cardiac cath  with 99% ostial RCA, s/p DES placement. I have gone over his cardiac cath results and explained the anatomy of his stent placement. I have given him a print out of the illustration for cath and stent placement.   He is having some dyspnea on the Brilinta. He did not tolerate Plavix in the past. I have asked him to try to stay on the Brilinta another month to see if the symptoms subside. If not to call us. May need to think about Effient instead. Continue coreg at current dose.   Cardiac rehab will call him, he has been referred.   2.  Hypertension: He is low normal but does not appear to tolerate it very well. This may be due to the addition of the coreg, causing lower BP. I will decrease his lisinopril to 10 mg daily from 20 mg in hopes of having slightly higher BP, and resolution of symptoms. He is given a BP recording sheet to take home and monitor levels of BP. He is advised not to use wrist cuff, but to buy an arm cuff BP machine for accuracy.   3. Restless legs and some cramping: He is on a PPI, this may be dropping his magnesium causing symptoms. Potassium was 4.6 during hospitalization. I will add magnesium to labs that are being drawn in am.   4. GERD: He is on PPI, protonix 40 mg BID. He is decreasing the dose to 40 mg daily, as his symptoms have resolved with stent placement. He is advised to follow up with Dr. Gordy Levan, GI for ongoing management.   5. Diabetes: Patient is to see PCP for management.      Current medicines are reviewed at length with the patient today.    Labs/ tests ordered today include: Mg to be added to previously scheduled labs.  Phill Myron. West Pugh, ANP, AACC   08/26/2016 1:31 PM    [facility] [address],  [place], [place] [address] Phone: [phone]; Fax: [phone]

## 2016-08-26 NOTE — Patient Instructions (Signed)
Your physician recommends that you schedule a follow-up appointment in: 3 Months with [name]  Your physician has recommended you make the following change in your medication:  Decrease Lisinopril to 10 mg Daily   Decrease Protonix to 40 mg Daily   Your physician recommends that you return for lab work in: Have Magnesium lab done at the same time BMET is done for Marcus Noble PA-C   If you need a refill on your cardiac medications before your next appointment, please call your pharmacy.  Thank you for choosing [facility]!

## 2016-08-27 ENCOUNTER — Other Ambulatory Visit (INDEPENDENT_AMBULATORY_CARE_PROVIDER_SITE_OTHER): Payer: Medicare Other

## 2016-08-27 DIAGNOSIS — I10 Essential (primary) hypertension: Secondary | ICD-10-CM

## 2016-08-27 LAB — BASIC METABOLIC PANEL
BUN: 20 mg/dL (ref 6–23)
CALCIUM: 9.8 mg/dL (ref 8.4–10.5)
CO2: 31 mEq/L (ref 19–32)
Chloride: 95 mEq/L — ABNORMAL LOW (ref 96–112)
Creatinine, Ser: 0.93 mg/dL (ref 0.40–1.50)
GFR: 83.64 mL/min (ref >60.00)
Glucose, Bld: 272 mg/dL — ABNORMAL HIGH (ref 70–99)
POTASSIUM: 5.3 meq/L — AB (ref 3.5–5.1)
Sodium: 130 mEq/L — ABNORMAL LOW (ref 135–145)

## 2016-08-27 LAB — MAGNESIUM: MAGNESIUM: 1.5 mg/dL (ref 1.5–2.5)

## 2016-08-30 ENCOUNTER — Telehealth: Payer: Self-pay | Admitting: Adult Health

## 2016-08-30 MED ORDER — MAGNESIUM 200 MG PO TABS: 200.0000 mg | ORAL_TABLET | Freq: Every day | ORAL | 6 refills | Status: DC

## 2016-08-30 NOTE — Telephone Encounter (Signed)
Pt given lab results, will start magnesium 200 mg daily

## 2016-08-30 NOTE — Telephone Encounter (Signed)
Please give a call back concerning lab results

## 2016-09-07 ENCOUNTER — Encounter: Payer: Self-pay | Admitting: Physician Assistant

## 2016-09-07 ENCOUNTER — Ambulatory Visit (INDEPENDENT_AMBULATORY_CARE_PROVIDER_SITE_OTHER): Payer: Medicare Other | Admitting: Physician Assistant

## 2016-09-07 VITALS — BP 108/60 | HR 81 | Temp 97.5°F | Resp 14 | Ht 67.0 in | Wt 174.0 lb

## 2016-09-07 DIAGNOSIS — E871 Hypo-osmolality and hyponatremia: Secondary | ICD-10-CM | POA: Diagnosis not present

## 2016-09-07 LAB — COMPREHENSIVE METABOLIC PANEL
ALT: 35 U/L (ref 0–53)
AST: 19 U/L (ref 0–37)
Albumin: 4.4 g/dL (ref 3.5–5.2)
Alkaline Phosphatase: 44 U/L (ref 39–117)
BUN: 16 mg/dL (ref 6–23)
CALCIUM: 9.6 mg/dL (ref 8.4–10.5)
CHLORIDE: 99 meq/L (ref 96–112)
CO2: 29 meq/L (ref 19–32)
Creatinine, Ser: 0.8 mg/dL (ref 0.40–1.50)
GFR: 99.5 mL/min (ref >60.00)
Glucose, Bld: 150 mg/dL — ABNORMAL HIGH (ref 70–99)
Potassium: 4.8 mEq/L (ref 3.5–5.1)
Sodium: 134 mEq/L — ABNORMAL LOW (ref 135–145)
Total Bilirubin: 0.5 mg/dL (ref 0.2–1.2)
Total Protein: 6.8 g/dL (ref 6.0–8.3)

## 2016-09-07 LAB — IBC PANEL
Iron: 97 ug/dL (ref 42–165)
SATURATION RATIOS: 23.3 % (ref 20.0–50.0)
TRANSFERRIN: 298 mg/dL (ref 212.0–360.0)

## 2016-09-07 LAB — CBC
HCT: 39.5 % (ref 39.0–52.0)
HEMOGLOBIN: 13.7 g/dL (ref 13.0–17.0)
MCHC: 34.8 g/dL (ref 30.0–36.0)
MCV: 96.6 fl (ref 78.0–100.0)
PLATELETS: 258 10*3/uL (ref 150.0–400.0)
RBC: 4.09 Mil/uL — ABNORMAL LOW (ref 4.22–5.81)
RDW: 13.6 % (ref 11.5–15.5)
WBC: 6.7 10*3/uL (ref 4.0–10.5)

## 2016-09-07 NOTE — Patient Instructions (Signed)
Please go to the lab for blood work. I will call with your results.   Keep well-hydrated. We will alter regimen according to lab results. We may need to reduce lisinopril further.

## 2016-09-07 NOTE — Progress Notes (Signed)
Pre visit review using our clinic review tool, if applicable. No additional management support is needed unless otherwise documented below in the visit note. 

## 2016-09-07 NOTE — Progress Notes (Signed)
 Patient presents to clinic today for follow-up of hyponatremia. At last check, sodium level at 130. Patient with elevated glucose at 272 so corrected sodium level calculated at 133 which is still low. This has been chronic over the past few checks. Patient endorses adding on a small G2 gatorade "here and there". Notes BP at home averaging 120-130s/80s. Has a log for review. Is taking all medications as directed, noting Lisinopril was recently decreased to 10 mg daily by Cardiology. Patient denies chest pain, palpitations, lightheadedness, dizziness, vision changes or frequent headaches.   Past Medical History:  Diagnosis Date  . 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 Arthritis    "lower back" (07/29/2016)  . ASCVD (arteriosclerotic cardiovascular disease)    -luminal irregularities in 1998 and 2005; normal EF  . Benign prostatic hypertrophy    status post transurethral resection of the prostate  . CAD (coronary artery disease)    a. Cath 07/29/16 99% ostial RCA & 70% pRCA s/p cutting balloon angioplasty and single SYNERGY DES 2.5X28. 100% very Distal 1st RPLB lesion --> the occlusion site moved further distal with guidewire advancement;  40% ostial LAD; 50% Ost Cx to Prox Cx lesion.  . Coronary artery disease   . Dysphagia   . Erectile dysfunction   . GERD (gastroesophageal reflux disease)   . Heart murmur   . History of hiatal hernia   . History of kidney stones   . Hyperlipidemia   . Hypertension   . Seasonal allergies   . Tobacco abuse    20 pack years; discontinued 1995  . Type II diabetes mellitus (HCC)     Current Outpatient Prescriptions on File Prior to Visit  Medication Sig Dispense Refill  . acetaminophen (TYLENOL) 325 MG tablet Take 325 mg by mouth 2 (two) times daily as needed for mild pain.    . aspirin EC 81 MG tablet Take 81 mg by mouth at bedtime.     . atorvastatin (LIPITOR) 10 MG tablet Take 5 mg by mouth every other day.     . carvedilol (COREG) 3.125 MG tablet Take  1 tablet (3.125 mg total) by mouth 2 (two) times daily with a meal. 60 tablet 6  . diazepam (VALIUM) 5 MG tablet TAKE 1 TABLET BY MOUTH AT BEDTIME AS NEEDED FOR SLEEP  5  . docusate sodium (COLACE) 100 MG capsule Take 100 mg by mouth daily.     . LANTUS SOLOSTAR 100 UNIT/ML Solostar Pen Inject 18 Units into the skin daily at 10 pm. 15 mL 11  . lisinopril (PRINIVIL,ZESTRIL) 10 MG tablet Take 1 tablet (10 mg total) by mouth daily. 90 tablet 3  . Magnesium 200 MG TABS Take 1 tablet (200 mg total) by mouth daily. 30 each 6  . metFORMIN (GLUCOPHAGE) 1000 MG tablet Take 1 tablet (1,000 mg total) by mouth 2 (two) times daily with a meal. 180 tablet 3  . nitroGLYCERIN (NITROSTAT) 0.4 MG SL tablet Place 1 tablet (0.4 mg total) under the tongue every 5 (five) minutes x 3 doses as needed. (Patient taking differently: Place 0.4 mg under the tongue every 5 (five) minutes x 3 doses as needed for chest pain. ) 30 tablet 3  . pantoprazole (PROTONIX) 40 MG tablet Take 40 mg by mouth daily.    . polyethylene glycol (MIRALAX / GLYCOLAX) packet Take 17 g by mouth daily as needed for moderate constipation.     . sitaGLIPtin (JANUVIA) 100 MG tablet Take 1 tablet (100 mg total) by mouth   daily. 90 tablet 3  . ticagrelor (BRILINTA) 90 MG TABS tablet Take 1 tablet (90 mg total) by mouth 2 (two) times daily. 180 tablet 3  . Cyanocobalamin (VITAMIN B 12 PO) Take 2,000 Units by mouth daily.     [name] loratadine (CLARITIN) 10 MG tablet Take 10 mg by mouth daily as needed for allergies.      No current facility-administered medications on file prior to visit.     Allergies  Allergen Reactions  . Plavix [Clopidogrel Bisulfate] Shortness Of Breath  . Oxycontin [Oxycodone Hcl] Other (See Comments)    Malaise  . Penicillins Other (See Comments)    [place] Has patient had a PCN reaction causing immediate rash, facial/tongue/throat swelling, SOB or lightheadedness with hypotension: No Has patient had a PCN reaction causing  severe rash involving mucus membranes or skin necrosis: No Has patient had a PCN reaction that required hospitalization No Has patient had a PCN reaction occurring within the last 10 years: No If all of the above answers are "NO", then may proceed with Cephalosporin use.   . Pioglitazone Other (See Comments)    Made his chest hurt  . Prednisone Other (See Comments)    Reaction: muscle aches and soreness  . Simvastatin Other (See Comments)    Myalgias    Family History  Problem Relation Age of Onset  . Ovarian cancer Mother   . Colon cancer Sister 67       deceased from colon cancer    Social History   Social History  . Marital status: Married    Spouse name: N/A  . Number of children: 2  . Years of education: N/A   Occupational History  . retired-farmer, [place]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   Social History Main Topics  . Smoking status: Former Smoker    Packs/day: 1.00    Years: 20.00    Types: Cigarettes    Start date: 03/08/1965    Quit date: 07/07/1993  . Smokeless tobacco: Never Used  . Alcohol use 0.0 oz/week     Comment: 07/29/2016 "last drink was 4 wks ago; had a beer"  . Drug use: No  . Sexual activity: Not Currently   Other Topics Concern  . None   Social History Narrative  . None   Review of Systems - See HPI.  All other ROS are negative.  BP 108/60   Pulse 81   Temp (!) 97.5 F (36.4 C) (Oral)   Resp 14   Ht 5' 7" (1.702 m)   Wt 174 lb (78.9 kg)   SpO2 98%   BMI 27.25 kg/m   Physical Exam  Constitutional: He is oriented to person, place, and time and well-developed, well-nourished, and in no distress.  HENT:  Head: Normocephalic and atraumatic.  Eyes: Conjunctivae are normal.  Neck: Neck supple.  Cardiovascular: Normal rate, regular rhythm, normal heart sounds and intact distal pulses.   Pulmonary/Chest: Effort normal and breath sounds normal. No respiratory distress. He has no wheezes. He has no rales. He exhibits no  tenderness.  Neurological: He is alert and oriented to person, place, and time.  Skin: Skin is warm and dry. No rash noted.  Psychiatric: Affect normal.  Vitals reviewed.   Recent Results (from the past 2160 hour(s))  POCT Urinalysis Dipstick     Status: Abnormal   Collection Time: 06/21/16  4:23 PM  Result Value Ref Range   Color, UA yellow    Clarity,  UA clear    Glucose, UA 2+    Bilirubin, UA negative    Ketones, UA negative    Spec Grav, UA >=1.030 (A) 1.010 - 1.025   Blood, UA 1+    pH, UA 6.0 5.0 - 8.0   Protein, UA negative    Urobilinogen, UA 0.2 0.2 or 1.0 E.U./dL   Nitrite, UA negative    Leukocytes, UA Negative Negative  Urine Microscopic Only     Status: Abnormal   Collection Time: 06/21/16  4:37 PM  Result Value Ref Range   WBC, UA 0-2/hpf 0-2/hpf   RBC / HPF 0-2/hpf 0-2/hpf   Ca Oxalate Crys, UA Presence of (A) None  Urine Culture     Status: None   Collection Time: 06/21/16  4:37 PM  Result Value Ref Range   Organism ID, Bacteria NO GROWTH   Glucose, capillary     Status: Abnormal   Collection Time: 06/30/16  9:22 AM  Result Value Ref Range   Glucose-Capillary 130 (H) 65 - 99 mg/dL  POCT HgB A1C     Status: None   Collection Time: 07/15/16  9:46 AM  Result Value Ref Range   Hemoglobin A1C 6.7   CBC with Differential/Platelet     Status: None   Collection Time: 07/27/16 10:37 AM  Result Value Ref Range   WBC 7.9 4.0 - 10.5 K/uL   RBC 4.51 4.22 - 5.81 MIL/uL   Hemoglobin 15.0 13.0 - 17.0 g/dL   HCT 44.0 39.0 - 52.0 %   MCV 97.6 78.0 - 100.0 fL   MCH 33.3 26.0 - 34.0 pg   MCHC 34.1 30.0 - 36.0 g/dL   RDW 12.6 11.5 - 15.5 %   Platelets 246 150 - 400 K/uL   Neutrophils Relative % 67 %   Neutro Abs 5.4 1.7 - 7.7 K/uL   Lymphocytes Relative 22 %   Lymphs Abs 1.7 0.7 - 4.0 K/uL   Monocytes Relative 7 %   Monocytes Absolute 0.5 0.1 - 1.0 K/uL   Eosinophils Relative 3 %   Eosinophils Absolute 0.3 0.0 - 0.7 K/uL   Basophils Relative 1 %   Basophils  Absolute 0.0 0.0 - 0.1 K/uL  Protime-INR     Status: None   Collection Time: 07/27/16 10:37 AM  Result Value Ref Range   Prothrombin Time 12.8 11.4 - 15.2 seconds   INR 0.96   Basic metabolic panel     Status: Abnormal   Collection Time: 07/27/16 10:37 AM  Result Value Ref Range   Sodium 135 135 - 145 mmol/L   Potassium 4.6 3.5 - 5.1 mmol/L   Chloride 99 (L) 101 - 111 mmol/L   CO2 28 22 - 32 mmol/L   Glucose, Bld 142 (H) 65 - 99 mg/dL   BUN 21 (H) 6 - 20 mg/dL   Creatinine, Ser 0.83 0.61 - 1.24 mg/dL   Calcium 9.6 8.9 - 10.3 mg/dL   GFR calc non Af Amer >60 >60 mL/min   GFR calc Af Amer >60 >60 mL/min    Comment: (NOTE) The eGFR has been calculated using the CKD EPI equation. This calculation has not been validated in all clinical situations. eGFR's persistently <60 mL/min signify possible Chronic Kidney Disease.    Anion gap 8 5 - 15  Glucose, capillary     Status: Abnormal   Collection Time: 07/29/16  9:53 AM  Result Value Ref Range   Glucose-Capillary 169 (H) 65 - 99 mg/dL  Glucose, capillary       Status: Abnormal   Collection Time: 07/29/16 12:33 PM  Result Value Ref Range   Glucose-Capillary 143 (H) 65 - 99 mg/dL  POCT Activated clotting time     Status: None   Collection Time: 07/29/16  4:23 PM  Result Value Ref Range   Activated Clotting Time 285 seconds  POCT Activated clotting time     Status: None   Collection Time: 07/29/16  4:35 PM  Result Value Ref Range   Activated Clotting Time 373 seconds  POCT Activated clotting time     Status: None   Collection Time: 07/29/16  5:02 PM  Result Value Ref Range   Activated Clotting Time 285 seconds  POCT Activated clotting time     Status: None   Collection Time: 07/29/16  5:37 PM  Result Value Ref Range   Activated Clotting Time 274 seconds  POCT Activated clotting time     Status: None   Collection Time: 07/29/16  7:01 PM  Result Value Ref Range   Activated Clotting Time 197 seconds  POCT Activated clotting time      Status: None   Collection Time: 07/29/16  8:07 PM  Result Value Ref Range   Activated Clotting Time 147 seconds  Glucose, capillary     Status: Abnormal   Collection Time: 07/29/16  9:07 PM  Result Value Ref Range   Glucose-Capillary 206 (H) 65 - 99 mg/dL  Basic metabolic panel     Status: Abnormal   Collection Time: 07/30/16  2:55 AM  Result Value Ref Range   Sodium 132 (L) 135 - 145 mmol/L   Potassium 3.9 3.5 - 5.1 mmol/L   Chloride 101 101 - 111 mmol/L   CO2 22 22 - 32 mmol/L   Glucose, Bld 196 (H) 65 - 99 mg/dL   BUN 15 6 - 20 mg/dL   Creatinine, Ser 0.80 0.61 - 1.24 mg/dL   Calcium 8.6 (L) 8.9 - 10.3 mg/dL   GFR calc non Af Amer >60 >60 mL/min   GFR calc Af Amer >60 >60 mL/min    Comment: (NOTE) The eGFR has been calculated using the CKD EPI equation. This calculation has not been validated in all clinical situations. eGFR's persistently <60 mL/min signify possible Chronic Kidney Disease.    Anion gap 9 5 - 15  CBC     Status: Abnormal   Collection Time: 07/30/16  2:55 AM  Result Value Ref Range   WBC 10.8 (H) 4.0 - 10.5 K/uL   RBC 4.08 (L) 4.22 - 5.81 MIL/uL   Hemoglobin 13.4 13.0 - 17.0 g/dL   HCT 39.4 39.0 - 52.0 %   MCV 96.6 78.0 - 100.0 fL   MCH 32.8 26.0 - 34.0 pg   MCHC 34.0 30.0 - 36.0 g/dL   RDW 12.9 11.5 - 15.5 %   Platelets 238 150 - 400 K/uL  Glucose, capillary     Status: Abnormal   Collection Time: 07/30/16  5:51 AM  Result Value Ref Range   Glucose-Capillary 232 (H) 65 - 99 mg/dL  Basic metabolic panel     Status: Abnormal   Collection Time: 08/17/16 10:23 AM  Result Value Ref Range   Sodium 129 (L) 135 - 145 mEq/L   Potassium 4.6 3.5 - 5.1 mEq/L   Chloride 93 (L) 96 - 112 mEq/L   CO2 27 19 - 32 mEq/L   Glucose, Bld 210 (H) 70 - 99 mg/dL   BUN 22 6 - 23 mg/dL   Creatinine, Ser 1.04   0.40 - 1.50 mg/dL   Calcium 9.6 8.4 - 10.5 mg/dL   GFR 73.52 >60.00 mL/min  POCT Urinalysis Dipstick     Status: Abnormal   Collection Time: 08/17/16 10:48 AM   Result Value Ref Range   Color, UA yellow    Clarity, UA clear    Glucose, UA ++    Bilirubin, UA +    Ketones, UA trace    Spec Grav, UA 1.020 1.010 - 1.025   Blood, UA trace    pH, UA 6.0 5.0 - 8.0   Protein, UA +    Urobilinogen, UA 2.0 (A) 0.2 or 1.0 E.U./dL   Nitrite, UA negative    Leukocytes, UA Negative Negative  Urine Culture     Status: None   Collection Time: 08/17/16 10:48 AM  Result Value Ref Range   Organism ID, Bacteria NO GROWTH   Urine Microscopic Only     Status: Abnormal   Collection Time: 08/17/16 10:48 AM  Result Value Ref Range   WBC, UA 7-10/hpf (A) 0-2/hpf   RBC / HPF 3-6/hpf (A) 0-2/hpf   Squamous Epithelial / LPF Rare(0-4/hpf) Rare(0-4/hpf)   Bacteria, UA Few(10-50/hpf) (A) None  Basic Metabolic Panel (BMET)     Status: Abnormal   Collection Time: 08/27/16  9:08 AM  Result Value Ref Range   Sodium 130 (L) 135 - 145 mEq/L   Potassium 5.3 (H) 3.5 - 5.1 mEq/L   Chloride 95 (L) 96 - 112 mEq/L   CO2 31 19 - 32 mEq/L   Glucose, Bld 272 (H) 70 - 99 mg/dL   BUN 20 6 - 23 mg/dL   Creatinine, Ser 0.93 0.40 - 1.50 mg/dL   Calcium 9.8 8.4 - 10.5 mg/dL   GFR 83.64 >60.00 mL/min  Magnesium     Status: None   Collection Time: 08/27/16  9:08 AM  Result Value Ref Range   Magnesium 1.5 1.5 - 2.5 mg/dL   Assessment/Plan: 1. Hyponatremia Correct sodium at 133. Recheck CMP today. Lab panel today. Supportive measures reviewed. Will alter regimen accordingly.  - CBC - Comp Met (CMET) - IBC panel - Osmolality   Leeanne Rio, PA-C

## 2016-09-08 LAB — OSMOLALITY: Osmolality: 287 mOsm/kg (ref 278–305)

## 2016-09-09 ENCOUNTER — Telehealth (HOSPITAL_COMMUNITY): Payer: Self-pay

## 2016-09-09 ENCOUNTER — Encounter (HOSPITAL_COMMUNITY): Payer: Self-pay

## 2016-09-09 NOTE — Telephone Encounter (Signed)
I called patient @ [phone], I was unable to leave a message. Patient phone just rang and no voicemail. I have mailed patient a letter with information about cardiac rehab.

## 2016-09-09 NOTE — Telephone Encounter (Signed)
Patient insurance is active and benefits verified. Patient has [employer] - $20.00 co-payment, no deductible, out of pocket $4000/$1189.87 has been met, 0% co-insurance, no pre-authorization and no limit on visit. Passport/reference [phone].

## 2016-09-09 NOTE — Progress Notes (Signed)
I called patient @ [phone], I was unable to leave a message. Patient phone just rang and no voicemail. I have mailed patient a letter with information about cardiac rehab.

## 2016-09-10 ENCOUNTER — Other Ambulatory Visit: Payer: Self-pay | Admitting: Physician Assistant

## 2016-09-10 DIAGNOSIS — E876 Hypokalemia: Secondary | ICD-10-CM

## 2016-09-17 ENCOUNTER — Telehealth: Payer: Self-pay | Admitting: Adult Health

## 2016-09-17 MED ORDER — PRASUGREL HCL 10 MG PO TABS: 10.0000 mg | ORAL_TABLET | Freq: Every day | ORAL | 3 refills | Status: DC

## 2016-09-17 NOTE — Telephone Encounter (Signed)
Patients has questions regarding side effects of Brillinta / tg

## 2016-09-17 NOTE — Addendum Note (Signed)
Addended by: Barbarann Ehlers A on: 09/17/2016 03:41 PM   Modules accepted: Orders

## 2016-09-17 NOTE — Telephone Encounter (Signed)
He has been intolerant to Plavix and was having dyspnea on Brilinta. We tried it for another month to see if his symptoms would improve. Will stop Brilinta and begin Effient 10 mg daily. He is to see cardiologist on next office visit. He should be going to cardiac rehab to help with stamina.

## 2016-09-17 NOTE — Telephone Encounter (Signed)
Brilinta d/c'd, Effient e-scribed to pharmacy.Pt starts cardiac rehab in August

## 2016-09-17 NOTE — Telephone Encounter (Signed)
Pt called stating since stent on 07/29/16, he has been quite fatigued.His dose of lisinopril is 10 mg and his BP runs 120/60's. He is very bothered by this lack of energy and asks for advice.

## 2016-09-23 ENCOUNTER — Other Ambulatory Visit: Payer: Self-pay | Admitting: Nurse Practitioner

## 2016-10-01 ENCOUNTER — Encounter (HOSPITAL_COMMUNITY): Payer: Self-pay

## 2016-10-01 ENCOUNTER — Encounter (HOSPITAL_COMMUNITY)
Admission: RE | Admit: 2016-10-01 | Discharge: 2016-10-01 | Disposition: A | Discharge status: Home / Self Care | Payer: Medicare Other | Source: Ambulatory Visit | Attending: Cardiovascular Disease | Admitting: Cardiovascular Disease

## 2016-10-01 ENCOUNTER — Telehealth: Payer: Self-pay | Admitting: Adult Health

## 2016-10-01 ENCOUNTER — Other Ambulatory Visit (HOSPITAL_COMMUNITY)
Admission: RE | Admit: 2016-10-01 | Discharge: 2016-10-01 | Disposition: A | Discharge status: Home / Self Care | Payer: Medicare Other | Source: Ambulatory Visit | Attending: Adult Health | Admitting: Adult Health

## 2016-10-01 VITALS — BP 128/56 | HR 78 | Ht 67.0 in | Wt 177.7 lb

## 2016-10-01 DIAGNOSIS — Z955 Presence of coronary angioplasty implant and graft: Secondary | ICD-10-CM | POA: Insufficient documentation

## 2016-10-01 DIAGNOSIS — Z79899 Other long term (current) drug therapy: Secondary | ICD-10-CM | POA: Insufficient documentation

## 2016-10-01 DIAGNOSIS — Z48812 Encounter for surgical aftercare following surgery on the circulatory system: Secondary | ICD-10-CM | POA: Diagnosis not present

## 2016-10-01 LAB — CBC WITH DIFFERENTIAL/PLATELET
BASOS PCT: 0 %
Basophils Absolute: 0 10*3/uL (ref 0.0–0.1)
EOS ABS: 0.3 10*3/uL (ref 0.0–0.7)
Eosinophils Relative: 3 %
HEMATOCRIT: 42 % (ref 39.0–52.0)
Hemoglobin: 14.1 g/dL (ref 13.0–17.0)
Lymphocytes Relative: 20 %
Lymphs Abs: 1.9 10*3/uL (ref 0.7–4.0)
MCH: 32.9 pg (ref 26.0–34.0)
MCHC: 33.6 g/dL (ref 30.0–36.0)
MCV: 98.1 fL (ref 78.0–100.0)
MONO ABS: 0.6 10*3/uL (ref 0.1–1.0)
MONOS PCT: 6 %
NEUTROS ABS: 6.8 10*3/uL (ref 1.7–7.7)
Neutrophils Relative %: 71 %
Platelets: 267 10*3/uL (ref 150–400)
RBC: 4.28 MIL/uL (ref 4.22–5.81)
RDW: 13.2 % (ref 11.5–15.5)
WBC: 9.5 10*3/uL (ref 4.0–10.5)

## 2016-10-01 LAB — BASIC METABOLIC PANEL
Anion gap: 11 (ref 5–15)
BUN: 26 mg/dL — ABNORMAL HIGH (ref 6–20)
CALCIUM: 9.6 mg/dL (ref 8.9–10.3)
CO2: 24 mmol/L (ref 22–32)
CREATININE: 1.09 mg/dL (ref 0.61–1.24)
Chloride: 99 mmol/L — ABNORMAL LOW (ref 101–111)
GFR calc Af Amer: >60 mL/min (ref >60)
GFR calc non Af Amer: >60 mL/min (ref >60)
GLUCOSE: 148 mg/dL — AB (ref 65–99)
Potassium: 4.6 mmol/L (ref 3.5–5.1)
Sodium: 134 mmol/L — ABNORMAL LOW (ref 135–145)

## 2016-10-01 NOTE — Progress Notes (Signed)
Cardiac/Pulmonary Rehab Medication Review by a Pharmacist  Does the patient  feel that his/her medications are working for him/her?  yes  Has the patient been experiencing any side effects to the medications prescribed?  no  Does the patient measure his/her own blood pressure or blood glucose at home?  yes   Does the patient have any problems obtaining medications due to transportation or finances?   no  Understanding of regimen: good Understanding of indications: good Potential of compliance: excellent  Questions asked to Determine Patient Understanding of Medication Regimen:  1. What is the name of the medication?  2. What is the medication used for?  3. When should it be taken?  4. How much should be taken?  5. How will you take it?  6. What side effects should you report?  Understanding Defined as: Excellent: All questions above are correct Good: Questions 1-4 are correct Fair: Questions 1-2 are correct  Poor: 1 or none of the above questions are correct   Pharmacist comments: Pt does not c/o any side effects from current medications.  Med list updated.  Pt does check BP and blood sugar at home.  States his BP is a little low most of the time.  Questions answered.    Hart Robinsons A 10/01/2016 2:12 PM

## 2016-10-01 NOTE — Telephone Encounter (Signed)
Pt read his bottle wrong on his prasugrel (EFFIENT) 10 MG TABS tablet [[id number]]  And was  Taking 2 a day and now he's out please give him a call @ [phone]

## 2016-10-01 NOTE — Progress Notes (Signed)
Cardiac Individual Treatment Plan  Patient Details  Name: Marcus Norton MRN: [id number] Date of Birth: 1940/07/12 Referring Provider:     CARDIAC REHAB PHASE II ORIENTATION from 10/01/2016 in [place]  Referring Provider  [name]      Initial Encounter Date:    CARDIAC REHAB PHASE II ORIENTATION from 10/01/2016 in [place]  Date  10/01/16  Referring Provider  [name]      Visit Diagnosis: Status post coronary artery stent placement  Patient's Home Medications on Admission:  Current Outpatient Prescriptions:  .  alfuzosin (UROXATRAL) 10 MG 24 hr tablet, Take 10 mg by mouth daily with breakfast., Disp: , Rfl:  .  acetaminophen (TYLENOL) 325 MG tablet, Take 325 mg by mouth 2 (two) times daily as needed for mild pain., Disp: , Rfl:  .  aspirin EC 81 MG tablet, Take 81 mg by mouth at bedtime. , Disp: , Rfl:  .  atorvastatin (LIPITOR) 10 MG tablet, Take 5 mg by mouth every other day. , Disp: , Rfl:  .  carvedilol (COREG) 3.125 MG tablet, Take 1 tablet (3.125 mg total) by mouth 2 (two) times daily with a meal., Disp: 60 tablet, Rfl: 6 .  Cyanocobalamin (VITAMIN B 12 PO), Take 2,000 Units by mouth daily. , Disp: , Rfl:  .  diazepam (VALIUM) 5 MG tablet, TAKE 1 TABLET BY MOUTH AT BEDTIME AS NEEDED FOR SLEEP, Disp: , Rfl: 5 .  docusate sodium (COLACE) 100 MG capsule, Take 100 mg by mouth daily. , Disp: , Rfl:  .  LANTUS SOLOSTAR 100 UNIT/ML Solostar Pen, Inject 18 Units into the skin daily at 10 pm., Disp: 15 mL, Rfl: 11 .  lisinopril (PRINIVIL,ZESTRIL) 10 MG tablet, Take 1 tablet (10 mg total) by mouth daily., Disp: 90 tablet, Rfl: 3 .  loratadine (CLARITIN) 10 MG tablet, Take 10 mg by mouth daily as needed for allergies. , Disp: , Rfl:  .  Magnesium 200 MG TABS, Take 1 tablet (200 mg total) by mouth daily., Disp: 30 each, Rfl: 6 .  metFORMIN (GLUCOPHAGE) 1000 MG tablet, Take 1 tablet (1,000 mg total) by mouth 2 (two) times  daily with a meal., Disp: 180 tablet, Rfl: 3 .  nitroGLYCERIN (NITROSTAT) 0.4 MG SL tablet, Place 1 tablet (0.4 mg total) under the tongue every 5 (five) minutes x 3 doses as needed. (Patient taking differently: Place 0.4 mg under the tongue every 5 (five) minutes x 3 doses as needed for chest pain. ), Disp: 30 tablet, Rfl: 3 .  pantoprazole (PROTONIX) 40 MG tablet, TAKE 1 TABLET(40 MG) BY MOUTH TWICE DAILY BEFORE A MEAL, Disp: 180 tablet, Rfl: 3 .  polyethylene glycol (MIRALAX / GLYCOLAX) packet, Take 17 g by mouth daily as needed for moderate constipation. , Disp: , Rfl:  .  prasugrel (EFFIENT) 10 MG TABS tablet, Take 1 tablet (10 mg total) by mouth daily., Disp: 30 tablet, Rfl: 3 .  sitaGLIPtin (JANUVIA) 100 MG tablet, Take 1 tablet (100 mg total) by mouth daily., Disp: 90 tablet, Rfl: 3  Past Medical History: Past Medical History:  Diagnosis Date  . 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 Arthritis    "lower back" (07/29/2016)  . ASCVD (arteriosclerotic cardiovascular disease)    -luminal irregularities in 1998 and 2005; normal EF  . Benign prostatic hypertrophy    status post transurethral resection of the prostate  . CAD (coronary artery disease)    a. Cath 07/29/16 99% ostial RCA &  70% pRCA s/p cutting balloon angioplasty and single SYNERGY DES 2.5X28. 100% very Distal 1st RPLB lesion --> the occlusion site moved further distal with guidewire advancement;  40% ostial LAD; 50% Ost Cx to Prox Cx lesion.  . Coronary artery disease   . Dysphagia   . Erectile dysfunction   . GERD (gastroesophageal reflux disease)   . Heart murmur   . History of hiatal hernia   . History of kidney stones   . Hyperlipidemia   . Hypertension   . Seasonal allergies   . Tobacco abuse    20 pack years; discontinued 1995  . Type II diabetes mellitus (HCC)     Tobacco Use: History  Smoking Status  . Former Smoker  . Packs/day: 1.00  . Years: 15.00  . Types: Cigarettes  . Start date: 03/08/1965  . Quit  date: 07/07/1993  Smokeless Tobacco  . Never Used    Labs: Recent Review Flowsheet Data    Labs for ITP Cardiac and Pulmonary Rehab Latest Ref Rng & Units 12/15/2015 12/31/2015 03/04/2016 04/16/2016 07/15/2016   Cholestrol 0 - 200 mg/dL 149 - 141 - -   LDLCALC 0 - 99 mg/dL 90 - 86 - -   HDL >39.00 mg/dL 43 - 42.80 - -   Trlycerides 0.0 - 149.0 mg/dL 73 - 63.0 - -   Hemoglobin A1c - - 7.6 - 7.5 6.7      Capillary Blood Glucose: Lab Results  Component Value Date   GLUCAP 232 (H) 07/30/2016   GLUCAP 206 (H) 07/29/2016   GLUCAP 143 (H) 07/29/2016   GLUCAP 169 (H) 07/29/2016   GLUCAP 130 (H) 06/30/2016     Exercise Target Goals: Date: 10/01/16  Exercise Program Goal: Individual exercise prescription set with THRR, safety & activity barriers. Participant demonstrates ability to understand and report RPE using BORG scale, to self-measure pulse accurately, and to acknowledge the importance of the exercise prescription.  Exercise Prescription Goal: Starting with aerobic activity 30 plus minutes a day, 3 days per week for initial exercise prescription. Provide home exercise prescription and guidelines that participant acknowledges understanding prior to discharge.  Activity Barriers & Risk Stratification:     Activity Barriers & Cardiac Risk Stratification - 10/01/16 1504      Activity Barriers & Cardiac Risk Stratification   Activity Barriers None   Cardiac Risk Stratification Moderate      6 Minute Walk:     6 Minute Walk    Row Name 10/01/16 1331         6 Minute Walk   Phase Initial     Distance 1200 feet     Distance % Change 0 %     Walk Time 6 minutes     # of Rest Breaks 0     MPH 2.27     METS 2.74     RPE 9     Perceived Dyspnea  9     VO2 Peak 8.48     Symptoms No     Resting HR 78 bpm     Resting BP 128/50     Max Ex. HR 95 bpm     Max Ex. BP 152/62     2 Minute Post BP 128/50        Oxygen Initial Assessment:   Oxygen  Re-Evaluation:   Oxygen Discharge (Final Oxygen Re-Evaluation):   Initial Exercise Prescription:     Initial Exercise Prescription - 10/01/16 1300      Date of Initial  Exercise RX and Referring Provider   Date 10/01/16   Referring Provider [name]     Treadmill   MPH 1.9   Grade 0   Minutes 15   METs 2.4     NuStep   Level 2   SPM 94   Minutes 20   METs 2.2     Prescription Details   Frequency (times per week) 3   Duration Progress to 30 minutes of continuous aerobic without signs/symptoms of physical distress     Intensity   THRR 40-80% of Max Heartrate 104-117-130   Ratings of Perceived Exertion 11-13   Perceived Dyspnea 0-4     Progression   Progression Continue progressive overload as per policy without signs/symptoms or physical distress.     Resistance Training   Training Prescription Yes   Weight 1   Reps 10-15      Perform Capillary Blood Glucose checks as needed.  Exercise Prescription Changes:   Exercise Comments:   Exercise Goals and Review:      Exercise Goals    Row Name 10/01/16 1505             Exercise Goals   Increase Physical Activity Yes       Intervention Provide advice, education, support and counseling about physical activity/exercise needs.;Develop an individualized exercise prescription for aerobic and resistive training based on initial evaluation findings, risk stratification, comorbidities and participant's personal goals.       Expected Outcomes Achievement of increased cardiorespiratory fitness and enhanced flexibility, muscular endurance and strength shown through measurements of functional capacity and personal statement of participant.       Increase Strength and Stamina Yes       Intervention Provide advice, education, support and counseling about physical activity/exercise needs.;Develop an individualized exercise prescription for aerobic and resistive training based on initial evaluation findings, risk  stratification, comorbidities and participant's personal goals.       Expected Outcomes Achievement of increased cardiorespiratory fitness and enhanced flexibility, muscular endurance and strength shown through measurements of functional capacity and personal statement of participant.          Exercise Goals Re-Evaluation :    Discharge Exercise Prescription (Final Exercise Prescription Changes):   Nutrition:  Target Goals: Understanding of nutrition guidelines, daily intake of sodium 1500mg , cholesterol 200mg , calories 30% from fat and 7% or less from saturated fats, daily to have 5 or more servings of fruits and vegetables.  Biometrics:     Pre Biometrics - 10/01/16 1333      Pre Biometrics   Height 5\' 7"  (1.702 m)   Weight 177 lb 11.1 oz (80.6 kg)   Waist Circumference 40 inches   Hip Circumference 37 inches   Waist to Hip Ratio 1.08 %   BMI (Calculated) 27.9   Triceps Skinfold 7 mm   % Body Fat 25 %   Grip Strength 80 kg   Flexibility 0 in   Single Leg Stand 2 seconds       Nutrition Therapy Plan and Nutrition Goals:   Nutrition Discharge: Rate Your Plate Scores:     Nutrition Assessments - 10/01/16 1507      MEDFICTS Scores   Pre Score 50      Nutrition Goals Re-Evaluation:   Nutrition Goals Discharge (Final Nutrition Goals Re-Evaluation):   Psychosocial: Target Goals: Acknowledge presence or absence of significant depression and/or stress, maximize coping skills, provide positive support system. Participant is able to verbalize types and ability to use techniques  and skills needed for reducing stress and depression.  Initial Review & Psychosocial Screening:     Initial Psych Review & Screening - 10/01/16 1516      Initial Review   Current issues with None Identified     Family Dynamics   Good Support System? Yes     Barriers   Psychosocial barriers to participate in program There are no identifiable barriers or psychosocial needs.      Screening Interventions   Interventions Encouraged to exercise      Quality of Life Scores:     Quality of Life - 10/01/16 1333      Quality of Life Scores   Health/Function Pre 19.04 %   Socioeconomic Pre 28.93 %   Psych/Spiritual Pre 24.86 %   Family Pre 28.8 %   GLOBAL Pre 23.85 %      PHQ-9: Recent Review Flowsheet Data    Depression screen [facility] 2/9 10/01/2016 09/07/2016 08/17/2016 03/04/2016 02/24/2016   Decreased Interest 0 0 0 0 0   Down, Depressed, Hopeless 0 0 0 0 0   PHQ - 2 Score 0 0 0 0 0   Altered sleeping 2 1 1  - -   Tired, decreased energy 3 1 1  - -   Change in appetite 0 0 0 - -   Feeling bad or failure about yourself  0 0 0 - -   Trouble concentrating 0 0 0 - -   Moving slowly or fidgety/restless 0 0 0 - -   Suicidal thoughts 0 0 0 - -   PHQ-9 Score 5 2 2  - -   Difficult doing work/chores Somewhat difficult - - - -     Interpretation of Total Score  Total Score Depression Severity:  1-4 = Minimal depression, 5-9 = Mild depression, 10-14 = Moderate depression, 15-19 = Moderately severe depression, 20-27 = Severe depression   Psychosocial Evaluation and Intervention:     Psychosocial Evaluation - 10/01/16 1517      Psychosocial Evaluation & Interventions   Interventions Encouraged to exercise with the program and follow exercise prescription   Continue Psychosocial Services  No Follow up required      Psychosocial Re-Evaluation:   Psychosocial Discharge (Final Psychosocial Re-Evaluation):   Vocational Rehabilitation: Provide vocational rehab assistance to qualifying candidates.   Vocational Rehab Evaluation & Intervention:     Vocational Rehab - 10/01/16 1503      Initial Vocational Rehab Evaluation & Intervention   Assessment shows need for Vocational Rehabilitation No      Education: Education Goals: Education classes will be provided on a weekly basis, covering required topics. Participant will state understanding/return demonstration  of topics presented.  Learning Barriers/Preferences:     Learning Barriers/Preferences - 10/01/16 1502      Learning Barriers/Preferences   Learning Barriers None   Learning Preferences Written Material;Verbal Instruction;Group Instruction;Audio      Education Topics: Hypertension, Hypertension Reduction -Define heart disease and high blood pressure. Discus how high blood pressure affects the body and ways to reduce high blood pressure.   Exercise and Your Heart -Discuss why it is important to exercise, the FITT principles of exercise, normal and abnormal responses to exercise, and how to exercise safely.   Angina -Discuss definition of angina, causes of angina, treatment of angina, and how to decrease risk of having angina.   Cardiac Medications -Review what the following cardiac medications are used for, how they affect the body, and side effects that may occur when taking  the medications.  Medications include Aspirin, Beta blockers, calcium channel blockers, ACE Inhibitors, angiotensin receptor blockers, diuretics, digoxin, and antihyperlipidemics.   Congestive Heart Failure -Discuss the definition of CHF, how to live with CHF, the signs and symptoms of CHF, and how keep track of weight and sodium intake.   Heart Disease and Intimacy -Discus the effect sexual activity has on the heart, how changes occur during intimacy as we age, and safety during sexual activity.   Smoking Cessation / COPD -Discuss different methods to quit smoking, the health benefits of quitting smoking, and the definition of COPD.   Nutrition I: Fats -Discuss the types of cholesterol, what cholesterol does to the heart, and how cholesterol levels can be controlled.   Nutrition II: Labels -Discuss the different components of food labels and how to read food label   Heart Parts and Heart Disease -Discuss the anatomy of the heart, the pathway of blood circulation through the heart, and these are  affected by heart disease.   Stress I: Signs and Symptoms -Discuss the causes of stress, how stress may lead to anxiety and depression, and ways to limit stress.   Stress II: Relaxation -Discuss different types of relaxation techniques to limit stress.   Warning Signs of Stroke / TIA -Discuss definition of a stroke, what the signs and symptoms are of a stroke, and how to identify when someone is having stroke.   Knowledge Questionnaire Score:     Knowledge Questionnaire Score - 10/01/16 1503      Knowledge Questionnaire Score   Pre Score 18/24      Core Components/Risk Factors/Patient Goals at Admission:     Personal Goals and Risk Factors at Admission - 10/01/16 1507      Core Components/Risk Factors/Patient Goals on Admission    Weight Management Weight Maintenance   Personal Goal Other Yes   Personal Goal Get stronger, feel better overall   Intervention Attend program 3 x week and supplement with 2 x week at home.    Expected Outcomes Achieve personal goals      Core Components/Risk Factors/Patient Goals Review:      Goals and Risk Factor Review    Row Name 10/01/16 1516             Core Components/Risk Factors/Patient Goals Review   Personal Goals Review Other  Get stronger          Core Components/Risk Factors/Patient Goals at Discharge (Final Review):      Goals and Risk Factor Review - 10/01/16 1516      Core Components/Risk Factors/Patient Goals Review   Personal Goals Review Other  Get stronger      ITP Comments:   Comments: Patient arrived for 1st visit/orientation/education at 1230. Patient was referred to CR by [name] due to Stent Placement (Z95.5). During orientation advised patient on arrival and appointment times what to wear, what to do before, during and after exercise. Reviewed attendance and class policy. Talked about inclement weather and class consultation policy. Pt is scheduled to return Cardiac Rehab on 10/04/16 at  1545. Pt was advised to come to class 15 minutes before class starts. Patient was also given instructions on meeting with the dietician and attending the Family Structure classes. Pt is eager to get started. Patient participated in warm-up stretches followed by light weights and resistance bands. Patient was able to complete 6 minute walk test. Patient experienced calf pain 3/10 during test. Pain escalated at rest 9/10. Patient did not have pain  when orientation/education ended. Patient was measured for the equipment. Discussed equipment safety with patient. Took patient pre-anthropometric measurements. Patient finished visit at 1500.

## 2016-10-01 NOTE — Progress Notes (Signed)
Daily Session Note  Patient Details  Name: Marcus Norton MRN: [id number] Date of Birth: 10-16-1940 Referring Provider:     CARDIAC REHAB PHASE II ORIENTATION from 10/01/2016 in [place]  Referring Provider  [name]      Encounter Date: 10/01/2016  Check In:     Session Check In - 10/01/16 1230      Check-In   Location [facility]   Staff Present [name], MS, EP, [facility], Exercise Physiologist;Gregory Luther Parody, BS, EP, Exercise Physiologist   Supervising physician immediately available to respond to emergencies See telemetry face sheet for immediately available MD   Medication changes reported     No   Fall or balance concerns reported    No   Tobacco Cessation --  Quit 1995   Warm-up and Cool-down Performed as group-led instruction   Resistance Training Performed Yes   VAD Patient? No     Pain Assessment   Currently in Pain? No/denies   Pain Score 0-No pain   Multiple Pain Sites No      Capillary Blood Glucose: No results found for this or any previous visit (from the past 24 hour(s)).    History  Smoking Status  . Former Smoker  . Packs/day: 1.00  . Years: 15.00  . Types: Cigarettes  . Start date: 03/08/1965  . Quit date: 07/07/1993  Smokeless Tobacco  . Never Used    Goals Met:  Independence with exercise equipment Exercise tolerated well No report of cardiac concerns or symptoms Strength training completed today  Goals Unmet:  Not Applicable  Comments: Check out: 1500   [name] is Medical Director for [place] and Pulmonary Rehab.

## 2016-10-01 NOTE — Telephone Encounter (Signed)
Returned pt call, spoke with his wife. She stated that the pt has taken his effient 2 times daily since given the new prescription on 09/17/2016. I spoke with Jory Sims, D.N.P.,A.A.C.C and she stated that the pt needs to hav elab work done. I will put order in for his labs.

## 2016-10-04 ENCOUNTER — Encounter (HOSPITAL_COMMUNITY)
Admission: RE | Admit: 2016-10-04 | Discharge: 2016-10-04 | Disposition: A | Discharge status: Home / Self Care | Payer: Medicare Other | Source: Ambulatory Visit | Attending: Cardiovascular Disease | Admitting: Cardiovascular Disease

## 2016-10-04 DIAGNOSIS — Z48812 Encounter for surgical aftercare following surgery on the circulatory system: Secondary | ICD-10-CM | POA: Diagnosis not present

## 2016-10-04 DIAGNOSIS — Z955 Presence of coronary angioplasty implant and graft: Secondary | ICD-10-CM

## 2016-10-04 NOTE — Progress Notes (Signed)
Daily Session Note  Patient Details  Name: Marcus Norton MRN: [id number] Date of Birth: 12-05-1940 Referring Provider:     CARDIAC REHAB PHASE II ORIENTATION from 10/01/2016 in [place]  Referring Provider  [name]      Encounter Date: 10/04/2016  Check In:     Session Check In - 10/04/16 1545      Check-In   Location [facility]   Staff Present Diane Angelina Pih, MS, EP, [facility], Exercise Physiologist;Breon Diss Wynetta Emery, RN, BSN   Supervising physician immediately available to respond to emergencies See telemetry face sheet for immediately available MD   Medication changes reported     No   Fall or balance concerns reported    No   Tobacco Cessation No Change   Warm-up and Cool-down Performed as group-led instruction   Resistance Training Performed Yes   VAD Patient? No     Pain Assessment   Currently in Pain? No/denies   Pain Score 0-No pain   Multiple Pain Sites No      Capillary Blood Glucose: No results found for this or any previous visit (from the past 24 hour(s)).    History  Smoking Status  . Former Smoker  . Packs/day: 1.00  . Years: 15.00  . Types: Cigarettes  . Start date: 03/08/1965  . Quit date: 07/07/1993  Smokeless Tobacco  . Never Used    Goals Met:  Independence with exercise equipment Exercise tolerated well No report of cardiac concerns or symptoms Strength training completed today  Goals Unmet:  Not Applicable  Comments: Check out 1645.   [name] is Medical Director for [facility] Cardiac and Pulmonary Rehab.

## 2016-10-06 ENCOUNTER — Encounter (HOSPITAL_COMMUNITY)
Admission: RE | Admit: 2016-10-06 | Discharge: 2016-10-06 | Disposition: A | Discharge status: Home / Self Care | Payer: Medicare Other | Source: Ambulatory Visit | Attending: Cardiovascular Disease | Admitting: Cardiovascular Disease

## 2016-10-06 ENCOUNTER — Telehealth: Payer: Self-pay | Admitting: *Deleted

## 2016-10-06 ENCOUNTER — Encounter: Payer: Self-pay | Admitting: Emergency Medicine

## 2016-10-06 DIAGNOSIS — Z955 Presence of coronary angioplasty implant and graft: Secondary | ICD-10-CM

## 2016-10-06 DIAGNOSIS — Z48812 Encounter for surgical aftercare following surgery on the circulatory system: Secondary | ICD-10-CM | POA: Diagnosis not present

## 2016-10-06 MED ORDER — PRASUGREL HCL 10 MG PO TABS: 10.0000 mg | ORAL_TABLET | Freq: Every day | ORAL | 3 refills | Status: DC

## 2016-10-06 NOTE — Progress Notes (Signed)
Daily Session Note  Patient Details  Name: Marcus Norton MRN: [id number] Date of Birth: [birth year] Referring Provider:     CARDIAC REHAB PHASE II ORIENTATION from 10/01/2016 in [place]  Referring Provider  [name]      Encounter Date: 10/06/2016  Check In:     Session Check In - 10/06/16 1545      Check-In   Location [facility]   Staff Present Diane Angelina Pih, MS, EP, [facility], Exercise Physiologist;Zaela Graley Wynetta Emery, RN, BSN   Supervising physician immediately available to respond to emergencies See telemetry face sheet for immediately available MD   Medication changes reported     No   Fall or balance concerns reported    No   Tobacco Cessation No Change   Warm-up and Cool-down Performed as group-led instruction   Resistance Training Performed Yes   VAD Patient? No     Pain Assessment   Currently in Pain? No/denies   Pain Score 0-No pain   Multiple Pain Sites No      Capillary Blood Glucose: No results found for this or any previous visit (from the past 24 hour(s)).    History  Smoking Status  . Former Smoker  . Packs/day: 1.00  . Years: 15.00  . Types: Cigarettes  . Start date: 03/08/1965  . Quit date: 07/07/1993  Smokeless Tobacco  . Never Used    Goals Met:  Independence with exercise equipment Exercise tolerated well No report of cardiac concerns or symptoms Strength training completed today  Goals Unmet:  Not Applicable  Comments: Check out 1645.   [name] is Medical Director for [facility] Cardiac and Pulmonary Rehab.

## 2016-10-06 NOTE — Telephone Encounter (Signed)
Patient notified of recent  lab results. 

## 2016-10-08 ENCOUNTER — Encounter (HOSPITAL_COMMUNITY)
Admission: RE | Admit: 2016-10-08 | Discharge: 2016-10-08 | Disposition: A | Discharge status: Home / Self Care | Payer: Medicare Other | Source: Ambulatory Visit | Attending: Cardiovascular Disease | Admitting: Cardiovascular Disease

## 2016-10-08 DIAGNOSIS — Z48812 Encounter for surgical aftercare following surgery on the circulatory system: Secondary | ICD-10-CM | POA: Diagnosis not present

## 2016-10-08 DIAGNOSIS — Z955 Presence of coronary angioplasty implant and graft: Secondary | ICD-10-CM

## 2016-10-08 NOTE — Progress Notes (Signed)
Daily Session Note  Patient Details  Name: Marcus Norton MRN: [id number] Date of Birth: 12/28/1940 Referring Provider:     CARDIAC REHAB PHASE II ORIENTATION from 10/01/2016 in [place] CARDIAC REHABILITATION  Referring Provider  Dr. Koneswaran      Encounter Date: 10/08/2016  Check In:     Session Check In - 10/08/16 1545      Check-In   Location [facility]   Staff Present  , RN, BSN;Gregory Cowan, BS, EP, Exercise Physiologist   Supervising physician immediately available to respond to emergencies See telemetry face sheet for immediately available MD   Medication changes reported     No   Fall or balance concerns reported    No   Warm-up and Cool-down Performed as group-led instruction   Resistance Training Performed Yes   VAD Patient? No     Pain Assessment   Currently in Pain? No/denies   Pain Score 0-No pain   Multiple Pain Sites No      Capillary Blood Glucose: No results found for this or any previous visit (from the past 24 hour(s)).    History  Smoking Status  . Former Smoker  . Packs/day: 1.00  . Years: 15.00  . Types: Cigarettes  . Start date: 03/08/1965  . Quit date: 07/07/1993  Smokeless Tobacco  . Never Used    Goals Met:  Independence with exercise equipment Exercise tolerated well No report of cardiac concerns or symptoms Strength training completed today  Goals Unmet:  Not Applicable  Comments: Check out 1645.   Dr. Suresh Koneswaran is Medical Director for [place] Cardiac and Pulmonary Rehab. 

## 2016-10-11 ENCOUNTER — Encounter (HOSPITAL_COMMUNITY)
Admission: RE | Admit: 2016-10-11 | Discharge: 2016-10-11 | Disposition: A | Discharge status: Home / Self Care | Payer: Medicare Other | Source: Ambulatory Visit | Attending: Cardiovascular Disease | Admitting: Cardiovascular Disease

## 2016-10-11 DIAGNOSIS — Z48812 Encounter for surgical aftercare following surgery on the circulatory system: Secondary | ICD-10-CM | POA: Diagnosis not present

## 2016-10-11 DIAGNOSIS — Z955 Presence of coronary angioplasty implant and graft: Secondary | ICD-10-CM

## 2016-10-11 NOTE — Progress Notes (Signed)
Daily Session Note  Patient Details  Name: JAHAZIEL FRANCOIS MRN: [id number] Date of Birth: May 12, 1940 Referring Provider:     CARDIAC REHAB PHASE II ORIENTATION from 10/01/2016 in [place]  Referring Provider  [name]      Encounter Date: 10/11/2016  Check In:     Session Check In - 10/11/16 1545      Check-In   Location [facility]   Staff Present Aundra Dubin, RN, BSN;Gregory Luther Parody, BS, EP, Exercise Physiologist   Supervising physician immediately available to respond to emergencies See telemetry face sheet for immediately available MD   Medication changes reported     No   Fall or balance concerns reported    No   Tobacco Cessation No Change   Warm-up and Cool-down Performed as group-led instruction   Resistance Training Performed Yes   VAD Patient? No     Pain Assessment   Currently in Pain? No/denies   Pain Score 0-No pain   Multiple Pain Sites No      Capillary Blood Glucose: No results found for this or any previous visit (from the past 24 hour(s)).    History  Smoking Status  . Former Smoker  . Packs/day: 1.00  . Years: 15.00  . Types: Cigarettes  . Start date: 03/08/1965  . Quit date: 07/07/1993  Smokeless Tobacco  . Never Used    Goals Met:  Independence with exercise equipment Exercise tolerated well No report of cardiac concerns or symptoms Strength training completed today  Goals Unmet:  Not Applicable  Comments: Check out 1645.   [name] is Medical Director for [facility] Cardiac and Pulmonary Rehab.

## 2016-10-12 ENCOUNTER — Ambulatory Visit (INDEPENDENT_AMBULATORY_CARE_PROVIDER_SITE_OTHER): Payer: Medicare Other | Admitting: Nurse Practitioner

## 2016-10-12 ENCOUNTER — Encounter: Payer: Self-pay | Admitting: Nurse Practitioner

## 2016-10-12 VITALS — BP 135/59 | HR 78 | Temp 97.0°F | Ht 67.0 in | Wt 174.6 lb

## 2016-10-12 DIAGNOSIS — K5900 Constipation, unspecified: Secondary | ICD-10-CM

## 2016-10-12 DIAGNOSIS — R1013 Epigastric pain: Secondary | ICD-10-CM | POA: Diagnosis not present

## 2016-10-12 MED ORDER — PANTOPRAZOLE SODIUM 40 MG PO TBEC: 40.0000 mg | DELAYED_RELEASE_TABLET | Freq: Every day | ORAL | 5 refills | Status: DC

## 2016-10-12 NOTE — Assessment & Plan Note (Signed)
Symptoms much improved on Colace and MiraLAX. The patient prefers Colace to MiraLAX as MiraLAX tends to cause urgency. Recommended continue Colace. Use MiraLAX only as needed. Return for follow-up in 6 months.

## 2016-10-12 NOTE — Progress Notes (Signed)
Referring Provider: Delorse Limber Primary Care Physician:  [name], PA-C Primary GI:  Dr. Gala Romney  Chief Complaint  Patient presents with  . Gastroesophageal Reflux  . Constipation    HPI:   Marcus Norton is a 76 y.o. male who presents for follow-up on GERD and constipation. The patient was last seen in our office 07/12/2016 for the same. He had previously had bowel habit changes and underwent colonoscopy 06/30/2016 which found normal colon. He was started on Protonix 40 mg daily. At his last visit he was doing well overall. Noted some chest pain and was scheduled to see cardiology although his most recent chest pain episode resolved with over-the-counter antacid. MiraLAX and Colace were helping his constipation although occasionally overshot and had diarrhea. No other overt GI symptoms.  Recommended increasing Protonix to twice a day for the next 3 months, continue Colace and MiraLAX as needed and dose adjustment for regular bowel movements without diarrhea. Recommended return for follow-up in 3 months.  Today he states he's doing well overall. Constipation is better, having regular, soft bowel movements without straining. GERD symptoms are much improved on Protonix. Has had one or two episodes of fluctuance which is not overtly bothersome. Needs a new Rx to indicate decrease to once daily dosing. Generally denies abdominal pain, N/V, hematochezia, melena, fever, chills, unintentional weight loss, acute changes in bowel habits. Denies chest pain, dyspnea, dizziness, lightheadedness, syncope, near syncope. Denies any other upper or lower GI symptoms.   Past Medical History:  Diagnosis Date  . 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 Arthritis    "lower back" (07/29/2016)  . ASCVD (arteriosclerotic cardiovascular disease)    -luminal irregularities in 1998 and 2005; normal EF  . Benign prostatic hypertrophy    status post transurethral resection of the prostate  . CAD  (coronary artery disease)    a. Cath 07/29/16 99% ostial RCA & 70% pRCA s/p cutting balloon angioplasty and single SYNERGY DES 2.5X28. 100% very Distal 1st RPLB lesion --> the occlusion site moved further distal with guidewire advancement;  40% ostial LAD; 50% Ost Cx to Prox Cx lesion.  . Coronary artery disease   . Dysphagia   . Erectile dysfunction   . GERD (gastroesophageal reflux disease)   . Heart murmur   . History of hiatal hernia   . History of kidney stones   . Hyperlipidemia   . Hypertension   . Seasonal allergies   . Tobacco abuse    20 pack years; discontinued 1995  . Type II diabetes mellitus ([place])     Past Surgical History:  Procedure Laterality Date  . APPENDECTOMY    . CATARACT EXTRACTION W/ INTRAOCULAR LENS  IMPLANT, BILATERAL Bilateral   . COLONOSCOPY  07/20/2011   Dr. Gala Romney: multiple hyperplastic polyps. Surveillance 2018  . COLONOSCOPY N/A 06/30/2016   Procedure: COLONOSCOPY;  Surgeon: Daneil Dolin, MD;  Location: [facility];  Service: Endoscopy;  Laterality: N/A;  10:00am  . COLONOSCOPY W/ BIOPSIES AND POLYPECTOMY  07/08/08, 06/2011   Dr Madolyn Frieze papilla, diminutive rectal polyp ablated the, left-sided diverticula, piecemeal polypectomy of hepatic flexure adenomatous polyp, diminutive polyps near hepatic flexure ablated  . CORONARY ANGIOPLASTY WITH STENT PLACEMENT  07/29/2016  . CORONARY STENT INTERVENTION N/A 07/29/2016   Procedure: Coronary Stent Intervention;  Surgeon: Leonie Man, MD;  Location: [facility];  Service: Cardiovascular;  Laterality: N/A;  RCA  . ESOPHAGOGASTRODUODENOSCOPY  2013   erosive reflux esophagitis, s/p empiric  dilation. chronic duodenitis.   [name] LEFT HEART CATH AND CORONARY ANGIOGRAPHY N/A 07/29/2016   Procedure: Left Heart Cath and Coronary Angiography;  Surgeon: Leonie Man, MD;  Location: [facility];  Service: Cardiovascular;  Laterality: N/A;  . NASAL SEPTUM SURGERY    . NASAL SINUS SURGERY     "cleaned out  my sinus"  . PERIPHERAL VASCULAR CATHETERIZATION N/A 11/26/2015   Procedure: Abdominal Aortogram w/Lower Extremity;  Surgeon: Wellington Hampshire, MD;  Location: [facility];  Service: Cardiovascular;  Laterality: N/A;  . TONSILLECTOMY    . TRANSURETHRAL RESECTION OF PROSTATE  2009    Current Outpatient Prescriptions  Medication Sig Dispense Refill  . acetaminophen (TYLENOL) 325 MG tablet Take 325 mg by mouth 2 (two) times daily as needed for mild pain.    [name] alfuzosin (UROXATRAL) 10 MG 24 hr tablet Take 10 mg by mouth daily with breakfast.    . aspirin EC 81 MG tablet Take 81 mg by mouth at bedtime.     [name] atorvastatin (LIPITOR) 10 MG tablet Take 5 mg by mouth every other day.     . carvedilol (COREG) 3.125 MG tablet Take 1 tablet (3.125 mg total) by mouth 2 (two) times daily with a meal. 60 tablet 6  . Cyanocobalamin (VITAMIN B 12 PO) Take 2,000 Units by mouth daily.     . diazepam (VALIUM) 5 MG tablet TAKE 1 TABLET BY MOUTH AT BEDTIME AS NEEDED FOR SLEEP  5  . docusate sodium (COLACE) 100 MG capsule Take 100 mg by mouth daily.     [name] LANTUS SOLOSTAR 100 UNIT/ML Solostar Pen Inject 18 Units into the skin daily at 10 pm. 15 mL 11  . lisinopril (PRINIVIL,ZESTRIL) 10 MG tablet Take 1 tablet (10 mg total) by mouth daily. 90 tablet 3  . loratadine (CLARITIN) 10 MG tablet Take 10 mg by mouth daily as needed for allergies.     . Magnesium 200 MG TABS Take 1 tablet (200 mg total) by mouth daily. 30 each 6  . metFORMIN (GLUCOPHAGE) 1000 MG tablet Take 1 tablet (1,000 mg total) by mouth 2 (two) times daily with a meal. 180 tablet 3  . nitroGLYCERIN (NITROSTAT) 0.4 MG SL tablet Place 1 tablet (0.4 mg total) under the tongue every 5 (five) minutes x 3 doses as needed. (Patient taking differently: Place 0.4 mg under the tongue every 5 (five) minutes x 3 doses as needed for chest pain. ) 30 tablet 3  . pantoprazole (PROTONIX) 40 MG tablet TAKE 1 TABLET(40 MG) BY MOUTH TWICE DAILY BEFORE A MEAL 180 tablet  3  . polyethylene glycol (MIRALAX / GLYCOLAX) packet Take 17 g by mouth daily as needed for moderate constipation.     . prasugrel (EFFIENT) 10 MG TABS tablet Take 1 tablet (10 mg total) by mouth daily. 15 tablet 3  . sitaGLIPtin (JANUVIA) 100 MG tablet Take 1 tablet (100 mg total) by mouth daily. 90 tablet 3   No current facility-administered medications for this visit.     Allergies as of 10/12/2016 - Review Complete 10/12/2016  Allergen Reaction Noted  . Plavix [clopidogrel bisulfate] Shortness Of Breath 02/24/2016  . Oxycontin [oxycodone hcl] Other (See Comments) 05/14/2012  . Penicillins Other (See Comments) 04/30/2013  . Pioglitazone Other (See Comments) 04/30/2013  . Prednisone Other (See Comments) 07/14/2011  . Simvastatin Other (See Comments) 05/14/2012    Family History  Problem Relation Age of Onset  . Ovarian cancer Mother   .  Colon cancer Sister 60       deceased from colon cancer    Social History   Social History  . Marital status: Married    Spouse name: N/A  . Number of children: 2  . Years of education: N/A   Occupational History  . retired-farmer, 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   Social History Main Topics  . Smoking status: Former Smoker    Packs/day: 1.00    Years: 15.00    Types: Cigarettes    Start date: 03/08/1965    Quit date: 07/07/1993  . Smokeless tobacco: Never Used  . Alcohol use 0.0 oz/week     Comment: 07/29/2016 "last drink was 4 wks ago; had a beer"  . Drug use: No  . Sexual activity: Not Currently   Other Topics Concern  . None   Social History Narrative  . None    Review of Systems: General: Negative for anorexia, weight loss, fever, chills, fatigue, weakness. ENT: Negative for hoarseness, difficulty swallowing. CV: Negative for chest pain, angina, palpitations, peripheral edema.  Respiratory: Negative for dyspnea at rest, cough, sputum, wheezing.  GI: See history of present illness. Endo: Negative  for unusual weight change.  Heme: Negative for bruising or bleeding.  Physical Exam: BP (!) 135/59   Pulse 78   Temp (!) 97 F (36.1 C) (Oral)   Ht 5\' 7"  (1.702 m)   Wt 174 lb 9.6 oz (79.2 kg)   BMI 27.35 kg/m  General:   Alert and oriented. Pleasant and cooperative. Well-nourished and well-developed.  Eyes:  Without icterus, sclera clear and conjunctiva pink.  Ears:  Normal auditory acuity. Cardiovascular:  S1, S2 present without murmurs appreciated. Extremities without clubbing or edema. Respiratory:  Clear to auscultation bilaterally. No wheezes, rales, or rhonchi. No distress.  Gastrointestinal:  +BS, soft, non-tender and non-distended. No HSM noted. No guarding or rebound. No masses appreciated.  Rectal:  Deferred  Musculoskalatal:  Symmetrical without gross deformities. Neurologic:  Alert and oriented x4;  grossly normal neurologically. Psych:  Alert and cooperative. Normal mood and affect. Heme/Lymph/Immune: No excessive bruising noted.    10/12/2016 10:08 AM   Disclaimer: This note was dictated with voice recognition software. Similar sounding words can inadvertently be transcribed and may not be corrected upon review.

## 2016-10-12 NOTE — Assessment & Plan Note (Signed)
Dyspepsia and GERD symptoms improved on Protonix. He is requesting a new prescription to indicate once a day dosing to his pharmacy. This prescription has been sent. Continue current medications otherwise. Return for follow-up in 6 months.

## 2016-10-12 NOTE — Progress Notes (Signed)
CC'ED TO PCP 

## 2016-10-12 NOTE — Patient Instructions (Signed)
1. I have sent in a new prescription for your Protonix to indicate once a day dosing. 2. Continue your other medications. 3. Return for follow-up in 6 months. 4. Call us if you have any questions or concerns.

## 2016-10-13 ENCOUNTER — Ambulatory Visit: Payer: Medicare Other | Admitting: Nurse Practitioner

## 2016-10-13 ENCOUNTER — Encounter (HOSPITAL_COMMUNITY)
Admission: RE | Admit: 2016-10-13 | Discharge: 2016-10-13 | Disposition: A | Discharge status: Home / Self Care | Payer: Medicare Other | Source: Ambulatory Visit | Attending: Cardiovascular Disease | Admitting: Cardiovascular Disease

## 2016-10-13 DIAGNOSIS — Z955 Presence of coronary angioplasty implant and graft: Secondary | ICD-10-CM

## 2016-10-13 DIAGNOSIS — Z48812 Encounter for surgical aftercare following surgery on the circulatory system: Secondary | ICD-10-CM | POA: Diagnosis not present

## 2016-10-13 NOTE — Progress Notes (Signed)
Daily Session Note  Patient Details  Name: Marcus Norton MRN: [id number] Date of Birth: [birth year] Referring Provider:     CARDIAC REHAB PHASE II ORIENTATION from 10/01/2016 in [place]  Referring Provider  [name]      Encounter Date: 10/13/2016  Check In:     Session Check In - 10/13/16 1545      Check-In   Location [facility]   Staff Present Suzanne Boron, BS, EP, Exercise Physiologist;Marites Nath Wynetta Emery, RN, BSN   Supervising physician immediately available to respond to emergencies See telemetry face sheet for immediately available MD   Medication changes reported     No   Fall or balance concerns reported    No   Warm-up and Cool-down Performed as group-led instruction   Resistance Training Performed Yes   VAD Patient? No     Pain Assessment   Currently in Pain? No/denies   Pain Score 0-No pain   Multiple Pain Sites No      Capillary Blood Glucose: No results found for this or any previous visit (from the past 24 hour(s)).    History  Smoking Status  . Former Smoker  . Packs/day: 1.00  . Years: 15.00  . Types: Cigarettes  . Start date: 03/08/1965  . Quit date: 07/07/1993  Smokeless Tobacco  . Never Used    Goals Met:  Independence with exercise equipment Exercise tolerated well No report of cardiac concerns or symptoms Strength training completed today  Goals Unmet:  Not Applicable  Comments: Check out 1645.   [name] is Medical Director for [facility] Cardiac and Pulmonary Rehab.

## 2016-10-15 ENCOUNTER — Encounter (HOSPITAL_COMMUNITY)
Admission: RE | Admit: 2016-10-15 | Discharge: 2016-10-15 | Disposition: A | Discharge status: Home / Self Care | Payer: Medicare Other | Source: Ambulatory Visit | Attending: Cardiovascular Disease | Admitting: Cardiovascular Disease

## 2016-10-15 DIAGNOSIS — Z955 Presence of coronary angioplasty implant and graft: Secondary | ICD-10-CM

## 2016-10-15 DIAGNOSIS — Z48812 Encounter for surgical aftercare following surgery on the circulatory system: Secondary | ICD-10-CM | POA: Diagnosis not present

## 2016-10-15 NOTE — Progress Notes (Signed)
Daily Session Note  Patient Details  Name: Marcus Norton MRN: [id number] Date of Birth: 02/12/1941 Referring Provider:     CARDIAC REHAB PHASE II ORIENTATION from 10/01/2016 in [place]  Referring Provider  [name]      Encounter Date: 10/15/2016  Check In:     Session Check In - 10/15/16 1545      Check-In   Location [facility]   Staff Present Suzanne Boron, BS, EP, Exercise Physiologist;Reco Shonk Wynetta Emery, RN, BSN   Supervising physician immediately available to respond to emergencies See telemetry face sheet for immediately available MD   Medication changes reported     No   Fall or balance concerns reported    No   Warm-up and Cool-down Performed as group-led instruction   Resistance Training Performed Yes   VAD Patient? No     Pain Assessment   Currently in Pain? No/denies   Pain Score 0-No pain   Multiple Pain Sites No      Capillary Blood Glucose: No results found for this or any previous visit (from the past 24 hour(s)).    History  Smoking Status  . Former Smoker  . Packs/day: 1.00  . Years: 15.00  . Types: Cigarettes  . Start date: 03/08/1965  . Quit date: 07/07/1993  Smokeless Tobacco  . Never Used    Goals Met:  Independence with exercise equipment Exercise tolerated well No report of cardiac concerns or symptoms Strength training completed today  Goals Unmet:  Not Applicable  Comments: Check out 1645.   [name] is Medical Director for [facility] Cardiac and Pulmonary Rehab.

## 2016-10-18 ENCOUNTER — Encounter (HOSPITAL_COMMUNITY)
Admission: RE | Admit: 2016-10-18 | Discharge: 2016-10-18 | Disposition: A | Discharge status: Home / Self Care | Payer: Medicare Other | Source: Ambulatory Visit | Attending: Cardiovascular Disease | Admitting: Cardiovascular Disease

## 2016-10-18 DIAGNOSIS — Z955 Presence of coronary angioplasty implant and graft: Secondary | ICD-10-CM

## 2016-10-18 DIAGNOSIS — Z48812 Encounter for surgical aftercare following surgery on the circulatory system: Secondary | ICD-10-CM | POA: Diagnosis not present

## 2016-10-18 NOTE — Progress Notes (Signed)
Daily Session Note  Patient Details  Name: Marcus Norton MRN: [id number] Date of Birth: Nov 27, 1940 Referring Provider:     CARDIAC REHAB PHASE II ORIENTATION from 10/01/2016 in [place]  Referring Provider  [name]      Encounter Date: 10/18/2016  Check In:     Session Check In - 10/18/16 1545      Check-In   Location [facility]   Staff Present Diane Angelina Pih, MS, EP, [facility], Exercise Physiologist;Ameena Vesey Wynetta Emery, RN, BSN   Supervising physician immediately available to respond to emergencies See telemetry face sheet for immediately available MD   Medication changes reported     No   Fall or balance concerns reported    No   Warm-up and Cool-down Performed as group-led instruction   Resistance Training Performed Yes   VAD Patient? No     Pain Assessment   Currently in Pain? No/denies   Pain Score 0-No pain   Multiple Pain Sites No      Capillary Blood Glucose: No results found for this or any previous visit (from the past 24 hour(s)).    History  Smoking Status  . Former Smoker  . Packs/day: 1.00  . Years: 15.00  . Types: Cigarettes  . Start date: 03/08/1965  . Quit date: 07/07/1993  Smokeless Tobacco  . Never Used    Goals Met:  Independence with exercise equipment Exercise tolerated well No report of cardiac concerns or symptoms Strength training completed today  Goals Unmet:  Not Applicable  Comments: Check out 1645.   [name] is Medical Director for [facility] Cardiac and Pulmonary Rehab.

## 2016-10-20 ENCOUNTER — Encounter (HOSPITAL_COMMUNITY)
Admission: RE | Admit: 2016-10-20 | Discharge: 2016-10-20 | Disposition: A | Discharge status: Home / Self Care | Payer: Medicare Other | Source: Ambulatory Visit | Attending: Cardiovascular Disease | Admitting: Cardiovascular Disease

## 2016-10-20 DIAGNOSIS — Z955 Presence of coronary angioplasty implant and graft: Secondary | ICD-10-CM

## 2016-10-20 DIAGNOSIS — Z48812 Encounter for surgical aftercare following surgery on the circulatory system: Secondary | ICD-10-CM | POA: Diagnosis not present

## 2016-10-20 NOTE — Progress Notes (Signed)
Daily Session Note  Patient Details  Name: Marcus Norton MRN: [id number] Date of Birth: [birth year] Referring Provider:     CARDIAC REHAB PHASE II ORIENTATION from 10/01/2016 in [place]  Referring Provider  [name]      Encounter Date: 10/20/2016  Check In:     Session Check In - 10/20/16 1545      Check-In   Location [facility]   Staff Present Diane Angelina Pih, MS, EP, [facility], Exercise Physiologist;Onna Nodal Wynetta Emery, RN, BSN   Supervising physician immediately available to respond to emergencies See telemetry face sheet for immediately available MD   Medication changes reported     No   Fall or balance concerns reported    No   Warm-up and Cool-down Performed as group-led instruction   Resistance Training Performed Yes   VAD Patient? No     Pain Assessment   Currently in Pain? No/denies   Pain Score 0-No pain   Multiple Pain Sites No      Capillary Blood Glucose: No results found for this or any previous visit (from the past 24 hour(s)).    History  Smoking Status  . Former Smoker  . Packs/day: 1.00  . Years: 15.00  . Types: Cigarettes  . Start date: 03/08/1965  . Quit date: 07/07/1993  Smokeless Tobacco  . Never Used    Goals Met:  Independence with exercise equipment Exercise tolerated well No report of cardiac concerns or symptoms Strength training completed today  Goals Unmet:  Not Applicable  Comments: Check out 1645.   [name] is Medical Director for [facility] Cardiac and Pulmonary Rehab.

## 2016-10-22 ENCOUNTER — Encounter (HOSPITAL_COMMUNITY)
Admission: RE | Admit: 2016-10-22 | Discharge: 2016-10-22 | Disposition: A | Discharge status: Home / Self Care | Payer: Medicare Other | Source: Ambulatory Visit | Attending: Cardiovascular Disease | Admitting: Cardiovascular Disease

## 2016-10-22 DIAGNOSIS — Z48812 Encounter for surgical aftercare following surgery on the circulatory system: Secondary | ICD-10-CM | POA: Diagnosis not present

## 2016-10-22 DIAGNOSIS — Z955 Presence of coronary angioplasty implant and graft: Secondary | ICD-10-CM

## 2016-10-22 NOTE — Progress Notes (Signed)
Daily Session Note  Patient Details  Name: MANCIL PFENNING MRN: [id number] Date of Birth: 05-14-1940 Referring Provider:     CARDIAC REHAB PHASE II ORIENTATION from 10/01/2016 in [place]  Referring Provider  [name]      Encounter Date: 10/22/2016  Check In:     Session Check In - 10/22/16 1545      Check-In   Location [facility]   Staff Present Diane Angelina Pih, MS, EP, [facility], Exercise Physiologist;[place] Wynetta Emery, RN, BSN   Supervising physician immediately available to respond to emergencies See telemetry face sheet for immediately available MD   Medication changes reported     No   Fall or balance concerns reported    No   Warm-up and Cool-down Performed as group-led instruction   Resistance Training Performed Yes   VAD Patient? No     Pain Assessment   Currently in Pain? No/denies   Pain Score 0-No pain   Multiple Pain Sites No      Capillary Blood Glucose: No results found for this or any previous visit (from the past 24 hour(s)).    History  Smoking Status  . Former Smoker  . Packs/day: 1.00  . Years: 15.00  . Types: Cigarettes  . Start date: 03/08/1965  . Quit date: 07/07/1993  Smokeless Tobacco  . Never Used    Goals Met:  Independence with exercise equipment Exercise tolerated well No report of cardiac concerns or symptoms Strength training completed today  Goals Unmet:  Not Applicable  Comments: Check out 1645.   [name] is Medical Director for [facility] Cardiac and Pulmonary Rehab.

## 2016-10-25 ENCOUNTER — Encounter (HOSPITAL_COMMUNITY): Payer: Medicare Other

## 2016-10-27 ENCOUNTER — Encounter (HOSPITAL_COMMUNITY)
Admission: RE | Admit: 2016-10-27 | Discharge: 2016-10-27 | Disposition: A | Discharge status: Home / Self Care | Payer: Medicare Other | Source: Ambulatory Visit | Attending: Cardiovascular Disease | Admitting: Cardiovascular Disease

## 2016-10-27 DIAGNOSIS — Z48812 Encounter for surgical aftercare following surgery on the circulatory system: Secondary | ICD-10-CM | POA: Diagnosis present

## 2016-10-27 DIAGNOSIS — Z955 Presence of coronary angioplasty implant and graft: Secondary | ICD-10-CM

## 2016-10-27 NOTE — Progress Notes (Signed)
Daily Session Note  Patient Details  Name: Marcus Norton MRN: [id number] Date of Birth: [birth year] Referring Provider:     CARDIAC REHAB PHASE II ORIENTATION from 10/01/2016 in [place]  Referring Provider  [name]      Encounter Date: 10/27/2016  Check In:     Session Check In - 10/27/16 1545      Check-In   Location [facility]   Staff Present Diane Angelina Pih, MS, EP, [facility], Exercise Physiologist;Tiffani Kadow Wynetta Emery, RN, BSN   Supervising physician immediately available to respond to emergencies See telemetry face sheet for immediately available MD   Medication changes reported     No   Fall or balance concerns reported    No   Warm-up and Cool-down Performed as group-led instruction   Resistance Training Performed Yes   VAD Patient? No     Pain Assessment   Currently in Pain? No/denies   Pain Score 0-No pain   Multiple Pain Sites No      Capillary Blood Glucose: No results found for this or any previous visit (from the past 24 hour(s)).    History  Smoking Status  . Former Smoker  . Packs/day: 1.00  . Years: 15.00  . Types: Cigarettes  . Start date: 03/08/1965  . Quit date: 07/07/1993  Smokeless Tobacco  . Never Used    Goals Met:  Independence with exercise equipment Exercise tolerated well No report of cardiac concerns or symptoms Strength training completed today  Goals Unmet:  Not Applicable  Comments: Check out 1645.   [name] is Medical Director for [facility] Cardiac and Pulmonary Rehab.

## 2016-10-27 NOTE — Progress Notes (Signed)
Cardiac Individual Treatment Plan  Patient Details  Name: RUVEN CORRADI MRN: [id number] Date of Birth: [birth year] Referring Provider:     CARDIAC REHAB PHASE II ORIENTATION from 10/01/2016 in [place]  Referring Provider  [name]      Initial Encounter Date:    CARDIAC REHAB PHASE II ORIENTATION from 10/01/2016 in [place]  Date  10/01/16  Referring Provider  [name]      Visit Diagnosis: Status post coronary artery stent placement  Patient's Home Medications on Admission:  Current Outpatient Prescriptions:  .  acetaminophen (TYLENOL) 325 MG tablet, Take 325 mg by mouth 2 (two) times daily as needed for mild pain., Disp: , Rfl:  .  alfuzosin (UROXATRAL) 10 MG 24 hr tablet, Take 10 mg by mouth daily with breakfast., Disp: , Rfl:  .  aspirin EC 81 MG tablet, Take 81 mg by mouth at bedtime. , Disp: , Rfl:  .  atorvastatin (LIPITOR) 10 MG tablet, Take 5 mg by mouth every other day. , Disp: , Rfl:  .  carvedilol (COREG) 3.125 MG tablet, Take 1 tablet (3.125 mg total) by mouth 2 (two) times daily with a meal., Disp: 60 tablet, Rfl: 6 .  Cyanocobalamin (VITAMIN B 12 PO), Take 2,000 Units by mouth daily. , Disp: , Rfl:  .  diazepam (VALIUM) 5 MG tablet, TAKE 1 TABLET BY MOUTH AT BEDTIME AS NEEDED FOR SLEEP, Disp: , Rfl: 5 .  docusate sodium (COLACE) 100 MG capsule, Take 100 mg by mouth daily. , Disp: , Rfl:  .  LANTUS SOLOSTAR 100 UNIT/ML Solostar Pen, Inject 18 Units into the skin daily at 10 pm., Disp: 15 mL, Rfl: 11 .  lisinopril (PRINIVIL,ZESTRIL) 10 MG tablet, Take 1 tablet (10 mg total) by mouth daily., Disp: 90 tablet, Rfl: 3 .  loratadine (CLARITIN) 10 MG tablet, Take 10 mg by mouth daily as needed for allergies. , Disp: , Rfl:  .  Magnesium 200 MG TABS, Take 1 tablet (200 mg total) by mouth daily., Disp: 30 each, Rfl: 6 .  metFORMIN (GLUCOPHAGE) 1000 MG tablet, Take 1 tablet (1,000 mg total) by mouth 2 (two) times  daily with a meal., Disp: 180 tablet, Rfl: 3 .  nitroGLYCERIN (NITROSTAT) 0.4 MG SL tablet, Place 1 tablet (0.4 mg total) under the tongue every 5 (five) minutes x 3 doses as needed. (Patient taking differently: Place 0.4 mg under the tongue every 5 (five) minutes x 3 doses as needed for chest pain. ), Disp: 30 tablet, Rfl: 3 .  pantoprazole (PROTONIX) 40 MG tablet, Take 1 tablet (40 mg total) by mouth daily., Disp: 90 tablet, Rfl: 5 .  polyethylene glycol (MIRALAX / GLYCOLAX) packet, Take 17 g by mouth daily as needed for moderate constipation. , Disp: , Rfl:  .  prasugrel (EFFIENT) 10 MG TABS tablet, Take 1 tablet (10 mg total) by mouth daily., Disp: 15 tablet, Rfl: 3 .  sitaGLIPtin (JANUVIA) 100 MG tablet, Take 1 tablet (100 mg total) by mouth daily., Disp: 90 tablet, Rfl: 3  Past Medical History: Past Medical History:  Diagnosis Date  . 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 Arthritis    "lower back" (07/29/2016)  . ASCVD (arteriosclerotic cardiovascular disease)    -luminal irregularities in 1998 and 2005; normal EF  . Benign prostatic hypertrophy    status post transurethral resection of the prostate  . CAD (coronary artery disease)    a. Cath 07/29/16 99% ostial RCA & 70% pRCA  s/p cutting balloon angioplasty and single SYNERGY DES 2.5X28. 100% very Distal 1st RPLB lesion --> the occlusion site moved further distal with guidewire advancement;  40% ostial LAD; 50% Ost Cx to Prox Cx lesion.  . Coronary artery disease   . Dysphagia   . Erectile dysfunction   . GERD (gastroesophageal reflux disease)   . Heart murmur   . History of hiatal hernia   . History of kidney stones   . Hyperlipidemia   . Hypertension   . Seasonal allergies   . Tobacco abuse    20 pack years; discontinued 1995  . Type II diabetes mellitus (HCC)     Tobacco Use: History  Smoking Status  . Former Smoker  . Packs/day: 1.00  . Years: 15.00  . Types: Cigarettes  . Start date: 03/08/1965  . Quit date: 07/07/1993   Smokeless Tobacco  . Never Used    Labs: Recent Review Flowsheet Data    Labs for ITP Cardiac and Pulmonary Rehab Latest Ref Rng & Units 12/15/2015 12/31/2015 03/04/2016 04/16/2016 07/15/2016   Cholestrol 0 - 200 mg/dL 149 - 141 - -   LDLCALC 0 - 99 mg/dL 90 - 86 - -   HDL >39.00 mg/dL 43 - 42.80 - -   Trlycerides 0.0 - 149.0 mg/dL 73 - 63.0 - -   Hemoglobin A1c - - 7.6 - 7.5 6.7      Capillary Blood Glucose: Lab Results  Component Value Date   GLUCAP 232 (H) 07/30/2016   GLUCAP 206 (H) 07/29/2016   GLUCAP 143 (H) 07/29/2016   GLUCAP 169 (H) 07/29/2016   GLUCAP 130 (H) 06/30/2016     Exercise Target Goals:    Exercise Program Goal: Individual exercise prescription set with THRR, safety & activity barriers. Participant demonstrates ability to understand and report RPE using BORG scale, to self-measure pulse accurately, and to acknowledge the importance of the exercise prescription.  Exercise Prescription Goal: Starting with aerobic activity 30 plus minutes a day, 3 days per week for initial exercise prescription. Provide home exercise prescription and guidelines that participant acknowledges understanding prior to discharge.  Activity Barriers & Risk Stratification:     Activity Barriers & Cardiac Risk Stratification - 10/01/16 1504      Activity Barriers & Cardiac Risk Stratification   Activity Barriers None   Cardiac Risk Stratification Moderate      6 Minute Walk:     6 Minute Walk    Row Name 10/01/16 1331         6 Minute Walk   Phase Initial     Distance 1200 feet     Distance % Change 0 %     Walk Time 6 minutes     # of Rest Breaks 0     MPH 2.27     METS 2.74     RPE 9     Perceived Dyspnea  9     VO2 Peak 8.48     Symptoms No     Resting HR 78 bpm     Resting BP 128/50     Max Ex. HR 95 bpm     Max Ex. BP 152/62     2 Minute Post BP 128/50        Oxygen Initial Assessment:   Oxygen Re-Evaluation:   Oxygen Discharge (Final Oxygen  Re-Evaluation):   Initial Exercise Prescription:     Initial Exercise Prescription - 10/01/16 1300      Date of Initial Exercise RX  and Referring Provider   Date 10/01/16   Referring Provider [name]     Treadmill   MPH 1.9   Grade 0   Minutes 15   METs 2.4     NuStep   Level 2   SPM 94   Minutes 20   METs 2.2     Prescription Details   Frequency (times per week) 3   Duration Progress to 30 minutes of continuous aerobic without signs/symptoms of physical distress     Intensity   THRR 40-80% of Max Heartrate 104-117-130   Ratings of Perceived Exertion 11-13   Perceived Dyspnea 0-4     Progression   Progression Continue progressive overload as per policy without signs/symptoms or physical distress.     Resistance Training   Training Prescription Yes   Weight 1   Reps 10-15      Perform Capillary Blood Glucose checks as needed.  Exercise Prescription Changes:      Exercise Prescription Changes    Row Name 10/05/16 1000 10/21/16 0800           Response to Exercise   Blood Pressure (Admit) 130/56 132/50      Blood Pressure (Exercise) 152/74 150/60      Blood Pressure (Exit) 112/60 146/50      Heart Rate (Admit) 97 bpm 70 bpm      Heart Rate (Exercise) 109 bpm 101 bpm      Heart Rate (Exit) 96 bpm 79 bpm      Rating of Perceived Exertion (Exercise) 13 11      Duration Progress to 30 minutes of  aerobic without signs/symptoms of physical distress Progress to 30 minutes of  aerobic without signs/symptoms of physical distress      Intensity THRR unchanged THRR unchanged        Progression   Progression Continue to progress workloads to maintain intensity without signs/symptoms of physical distress. Continue to progress workloads to maintain intensity without signs/symptoms of physical distress.        Resistance Training   Training Prescription Yes Yes      Weight 0 2      Reps 10-15 10-15        Treadmill   MPH 1.9 2.2      Grade 0 0       Minutes 15 15      METs 2.4 2.68        NuStep   Level 2 2      SPM 81 92      Minutes 20 20      METs 3.55 2.2        Home Exercise Plan   Plans to continue exercise at Home (comment) Home (comment)      Frequency Add 2 additional days to program exercise sessions. Add 2 additional days to program exercise sessions.         Exercise Comments:      Exercise Comments    Row Name 10/05/16 1018 10/21/16 0803         Exercise Comments Patient has just started CR and will be progressed in time.  Patient is doing well in CR.          Exercise Goals and Review:      Exercise Goals    Row Name 10/01/16 1505             Exercise Goals   Increase Physical Activity Yes       Intervention Provide  advice, education, support and counseling about physical activity/exercise needs.;Develop an individualized exercise prescription for aerobic and resistive training based on initial evaluation findings, risk stratification, comorbidities and participant's personal goals.       Expected Outcomes Achievement of increased cardiorespiratory fitness and enhanced flexibility, muscular endurance and strength shown through measurements of functional capacity and personal statement of participant.       Increase Strength and Stamina Yes       Intervention Provide advice, education, support and counseling about physical activity/exercise needs.;Develop an individualized exercise prescription for aerobic and resistive training based on initial evaluation findings, risk stratification, comorbidities and participant's personal goals.       Expected Outcomes Achievement of increased cardiorespiratory fitness and enhanced flexibility, muscular endurance and strength shown through measurements of functional capacity and personal statement of participant.          Exercise Goals Re-Evaluation :     Exercise Goals Re-Evaluation    Row Name 10/27/16 1407             Exercise Goal Re-Evaluation    Exercise Goals Review Increase Physical Activity;Increase Strength and Stamina       Comments Patient mets are increasing. He is progressing. Will continue to monitor for progress.        Expected Outcomes Patient will complete the program with continued increased strength, stamina, and activity.           Discharge Exercise Prescription (Final Exercise Prescription Changes):     Exercise Prescription Changes - 10/21/16 0800      Response to Exercise   Blood Pressure (Admit) 132/50   Blood Pressure (Exercise) 150/60   Blood Pressure (Exit) 146/50   Heart Rate (Admit) 70 bpm   Heart Rate (Exercise) 101 bpm   Heart Rate (Exit) 79 bpm   Rating of Perceived Exertion (Exercise) 11   Duration Progress to 30 minutes of  aerobic without signs/symptoms of physical distress   Intensity THRR unchanged     Progression   Progression Continue to progress workloads to maintain intensity without signs/symptoms of physical distress.     Resistance Training   Training Prescription Yes   Weight 2   Reps 10-15     Treadmill   MPH 2.2   Grade 0   Minutes 15   METs 2.68     NuStep   Level 2   SPM 92   Minutes 20   METs 2.2     Home Exercise Plan   Plans to continue exercise at Home (comment)   Frequency Add 2 additional days to program exercise sessions.      Nutrition:  Target Goals: Understanding of nutrition guidelines, daily intake of sodium 1500mg , cholesterol 200mg , calories 30% from fat and 7% or less from saturated fats, daily to have 5 or more servings of fruits and vegetables.  Biometrics:     Pre Biometrics - 10/01/16 1333      Pre Biometrics   Height 5\' 7"  (1.702 m)   Weight 177 lb 11.1 oz (80.6 kg)   Waist Circumference 40 inches   Hip Circumference 37 inches   Waist to Hip Ratio 1.08 %   BMI (Calculated) 27.9   Triceps Skinfold 7 mm   % Body Fat 25 %   Grip Strength 80 kg   Flexibility 0 in   Single Leg Stand 2 seconds       Nutrition Therapy Plan  and Nutrition Goals:     Nutrition Therapy & Goals -  10/27/16 1421      Nutrition Therapy   RD appointment defered Yes      Nutrition Discharge: Rate Your Plate Scores:     Nutrition Assessments - 10/01/16 1507      MEDFICTS Scores   Pre Score 50      Nutrition Goals Re-Evaluation:   Nutrition Goals Discharge (Final Nutrition Goals Re-Evaluation):   Psychosocial: Target Goals: Acknowledge presence or absence of significant depression and/or stress, maximize coping skills, provide positive support system. Participant is able to verbalize types and ability to use techniques and skills needed for reducing stress and depression.  Initial Review & Psychosocial Screening:     Initial Psych Review & Screening - 10/01/16 1516      Initial Review   Current issues with None Identified     Family Dynamics   Good Support System? Yes     Barriers   Psychosocial barriers to participate in program There are no identifiable barriers or psychosocial needs.     Screening Interventions   Interventions Encouraged to exercise      Quality of Life Scores:     Quality of Life - 10/01/16 1333      Quality of Life Scores   Health/Function Pre 19.04 %   Socioeconomic Pre 28.93 %   Psych/Spiritual Pre 24.86 %   Family Pre 28.8 %   GLOBAL Pre 23.85 %      PHQ-9: Recent Review Flowsheet Data    Depression screen [facility] 2/9 10/01/2016 09/07/2016 08/17/2016 03/04/2016 02/24/2016   Decreased Interest 0 0 0 0 0   Down, Depressed, Hopeless 0 0 0 0 0   PHQ - 2 Score 0 0 0 0 0   Altered sleeping 2 1 1  - -   Tired, decreased energy 3 1 1  - -   Change in appetite 0 0 0 - -   Feeling bad or failure about yourself  0 0 0 - -   Trouble concentrating 0 0 0 - -   Moving slowly or fidgety/restless 0 0 0 - -   Suicidal thoughts 0 0 0 - -   PHQ-9 Score 5 2 2  - -   Difficult doing work/chores Somewhat difficult - - - -     Interpretation of Total Score  Total Score Depression Severity:  1-4  = Minimal depression, 5-9 = Mild depression, 10-14 = Moderate depression, 15-19 = Moderately severe depression, 20-27 = Severe depression   Psychosocial Evaluation and Intervention:     Psychosocial Evaluation - 10/01/16 1517      Psychosocial Evaluation & Interventions   Interventions Encouraged to exercise with the program and follow exercise prescription   Continue Psychosocial Services  No Follow up required      Psychosocial Re-Evaluation:     Psychosocial Re-Evaluation    Row Name 10/27/16 1408             Psychosocial Re-Evaluation   Current issues with None Identified       Expected Outcomes Patient will have no psychosocial issues identified at discharge.        Interventions Encouraged to attend Cardiac Rehabilitation for the exercise       Continue Psychosocial Services  No Follow up required          Psychosocial Discharge (Final Psychosocial Re-Evaluation):     Psychosocial Re-Evaluation - 10/27/16 1408      Psychosocial Re-Evaluation   Current issues with None Identified   Expected Outcomes Patient will have no psychosocial  issues identified at discharge.    Interventions Encouraged to attend Cardiac Rehabilitation for the exercise   Continue Psychosocial Services  No Follow up required      Vocational Rehabilitation: Provide vocational rehab assistance to qualifying candidates.   Vocational Rehab Evaluation & Intervention:     Vocational Rehab - 10/01/16 1503      Initial Vocational Rehab Evaluation & Intervention   Assessment shows need for Vocational Rehabilitation No      Education: Education Goals: Education classes will be provided on a weekly basis, covering required topics. Participant will state understanding/return demonstration of topics presented.  Learning Barriers/Preferences:     Learning Barriers/Preferences - 10/01/16 1502      Learning Barriers/Preferences   Learning Barriers None   Learning Preferences Written  Material;Verbal Instruction;Group Instruction;Audio      Education Topics: Hypertension, Hypertension Reduction -Define heart disease and high blood pressure. Discus how high blood pressure affects the body and ways to reduce high blood pressure.   Exercise and Your Heart -Discuss why it is important to exercise, the FITT principles of exercise, normal and abnormal responses to exercise, and how to exercise safely.   Angina -Discuss definition of angina, causes of angina, treatment of angina, and how to decrease risk of having angina.   CARDIAC REHAB PHASE II EXERCISE from 10/22/2016 in [place]  Date  10/06/16  Educator  DC      Cardiac Medications -Review what the following cardiac medications are used for, how they affect the body, and side effects that may occur when taking the medications.  Medications include Aspirin, Beta blockers, calcium channel blockers, ACE Inhibitors, angiotensin receptor blockers, diuretics, digoxin, and antihyperlipidemics.   CARDIAC REHAB PHASE II EXERCISE from 10/22/2016 in [place]  Date  10/13/16  Educator  Etheleen Mayhew  Instruction Review Code  2- meets goals/outcomes      Congestive Heart Failure -Discuss the definition of CHF, how to live with CHF, the signs and symptoms of CHF, and how keep track of weight and sodium intake.   CARDIAC REHAB PHASE II EXERCISE from 10/22/2016 in [place]  Date  10/20/16  Educator  DC  Instruction Review Code  2- Demonstrated Understanding      Heart Disease and Intimacy -Discus the effect sexual activity has on the heart, how changes occur during intimacy as we age, and safety during sexual activity.   Smoking Cessation / COPD -Discuss different methods to quit smoking, the health benefits of quitting smoking, and the definition of COPD.   Nutrition I: Fats -Discuss the types of cholesterol, what cholesterol does to the heart, and  how cholesterol levels can be controlled.   Nutrition II: Labels -Discuss the different components of food labels and how to read food label   Heart Parts and Heart Disease -Discuss the anatomy of the heart, the pathway of blood circulation through the heart, and these are affected by heart disease.   Stress I: Signs and Symptoms -Discuss the causes of stress, how stress may lead to anxiety and depression, and ways to limit stress.   Stress II: Relaxation -Discuss different types of relaxation techniques to limit stress.   Warning Signs of Stroke / TIA -Discuss definition of a stroke, what the signs and symptoms are of a stroke, and how to identify when someone is having stroke.   Knowledge Questionnaire Score:     Knowledge Questionnaire Score - 10/01/16 1503      Knowledge Questionnaire  Score   Pre Score 18/24      Core Components/Risk Factors/Patient Goals at Admission:     Personal Goals and Risk Factors at Admission - 10/01/16 1507      Core Components/Risk Factors/Patient Goals on Admission    Weight Management Weight Maintenance   Personal Goal Other Yes   Personal Goal Get stronger, feel better overall   Intervention Attend program 3 x week and supplement with 2 x week at home.    Expected Outcomes Achieve personal goals      Core Components/Risk Factors/Patient Goals Review:      Goals and Risk Factor Review    Row Name 10/01/16 1516 10/27/16 1357           Core Components/Risk Factors/Patient Goals Review   Personal Goals Review Other  Get stronger -  Get stronger. Have more energy.      Review  - Patient has completed 10 sessions gaining 1 lb. He is doing well in the program with some progression. He feels the program is benefiting him. Will continue to monitor for progress.       Expected Outcomes  - Patient will complete the program meeting her personal goals.          Core Components/Risk Factors/Patient Goals at Discharge (Final Review):       Goals and Risk Factor Review - 10/27/16 1357      Core Components/Risk Factors/Patient Goals Review   Personal Goals Review --  Get stronger. Have more energy.   Review Patient has completed 10 sessions gaining 1 lb. He is doing well in the program with some progression. He feels the program is benefiting him. Will continue to monitor for progress.    Expected Outcomes Patient will complete the program meeting her personal goals.       ITP Comments:   Comments: ITP 30 Day REVIEW Patient doing well in the program. Will continue to monitor for progress.

## 2016-10-28 ENCOUNTER — Encounter: Payer: Self-pay | Admitting: Cardiovascular Disease

## 2016-10-28 ENCOUNTER — Ambulatory Visit (INDEPENDENT_AMBULATORY_CARE_PROVIDER_SITE_OTHER): Payer: Medicare Other | Admitting: Cardiovascular Disease

## 2016-10-28 VITALS — BP 148/64 | HR 78 | Ht 67.0 in | Wt 159.0 lb

## 2016-10-28 DIAGNOSIS — Z79899 Other long term (current) drug therapy: Secondary | ICD-10-CM | POA: Diagnosis not present

## 2016-10-28 DIAGNOSIS — E785 Hyperlipidemia, unspecified: Secondary | ICD-10-CM | POA: Diagnosis not present

## 2016-10-28 DIAGNOSIS — I739 Peripheral vascular disease, unspecified: Secondary | ICD-10-CM | POA: Diagnosis not present

## 2016-10-28 DIAGNOSIS — I25118 Atherosclerotic heart disease of native coronary artery with other forms of angina pectoris: Secondary | ICD-10-CM | POA: Diagnosis not present

## 2016-10-28 DIAGNOSIS — R5383 Other fatigue: Secondary | ICD-10-CM

## 2016-10-28 DIAGNOSIS — I10 Essential (primary) hypertension: Secondary | ICD-10-CM | POA: Diagnosis not present

## 2016-10-28 DIAGNOSIS — Z955 Presence of coronary angioplasty implant and graft: Secondary | ICD-10-CM | POA: Diagnosis not present

## 2016-10-28 MED ORDER — LISINOPRIL 20 MG PO TABS: 20.0000 mg | ORAL_TABLET | Freq: Every day | ORAL | 3 refills | Status: DC

## 2016-10-28 NOTE — Patient Instructions (Addendum)
Medication Instructions:   Stop Coreg (Carvedilol).  Increase Lisinopril to 20mg  daily.  Continue all other medications.    Labwork: none  Testing/Procedures: none  Follow-Up: 3 months   Any Other Special Instructions Will Be Listed Below (If Applicable).  If you need a refill on your cardiac medications before your next appointment, please call your pharmacy.

## 2016-10-28 NOTE — Progress Notes (Signed)
SUBJECTIVE: The patient presents for follow-up of coronary artery disease. He underwent drug-eluting stent placement to the RCA on 08/02/16. He had a residual ostial LAD 40% stenosis and 50% stenosis of the ostial to proximal left circumflex coronary artery.  He denies exertional chest pain and shortness of breath. He said about one and a half hours after taking morning medications, he feels lethargic and the symptoms last about 2 hours. He wonders if it is due to carvedilol.  He tells me systolic blood pressures were 190 last week. Blood pressure is 148/64 today.   Review of Systems: As per "subjective", otherwise negative.  Allergies  Allergen Reactions  . Plavix [Clopidogrel Bisulfate] Shortness Of Breath  . Oxycontin [Oxycodone Hcl] Other (See Comments)    Malaise  . Penicillins Other (See Comments)    [place] Has patient had a PCN reaction causing immediate rash, facial/tongue/throat swelling, SOB or lightheadedness with hypotension: No Has patient had a PCN reaction causing severe rash involving mucus membranes or skin necrosis: No Has patient had a PCN reaction that required hospitalization No Has patient had a PCN reaction occurring within the last 10 years: No If all of the above answers are "NO", then may proceed with Cephalosporin use.   . Pioglitazone Other (See Comments)    Made his chest hurt  . Prednisone Other (See Comments)    Reaction: muscle aches and soreness  . Simvastatin Other (See Comments)    Myalgias    Current Outpatient Prescriptions  Medication Sig Dispense Refill  . acetaminophen (TYLENOL) 325 MG tablet Take 325 mg by mouth 2 (two) times daily as needed for mild pain.    [name] alfuzosin (UROXATRAL) 10 MG 24 hr tablet Take 10 mg by mouth daily with breakfast.    . aspirin EC 81 MG tablet Take 81 mg by mouth at bedtime.     [name] atorvastatin (LIPITOR) 10 MG tablet Take 5 mg by mouth every other day.     . carvedilol (COREG) 3.125 MG tablet Take  1 tablet (3.125 mg total) by mouth 2 (two) times daily with a meal. 60 tablet 6  . Cyanocobalamin (VITAMIN B 12 PO) Take 2,000 Units by mouth daily.     . diazepam (VALIUM) 5 MG tablet TAKE 1 TABLET BY MOUTH AT BEDTIME AS NEEDED FOR SLEEP  5  . docusate sodium (COLACE) 100 MG capsule Take 100 mg by mouth daily.     [name] LANTUS SOLOSTAR 100 UNIT/ML Solostar Pen Inject 18 Units into the skin daily at 10 pm. 15 mL 11  . lisinopril (PRINIVIL,ZESTRIL) 10 MG tablet Take 1 tablet (10 mg total) by mouth daily. 90 tablet 3  . loratadine (CLARITIN) 10 MG tablet Take 10 mg by mouth daily as needed for allergies.     . Magnesium 200 MG TABS Take 1 tablet (200 mg total) by mouth daily. 30 each 6  . metFORMIN (GLUCOPHAGE) 1000 MG tablet Take 1 tablet (1,000 mg total) by mouth 2 (two) times daily with a meal. 180 tablet 3  . nitroGLYCERIN (NITROSTAT) 0.4 MG SL tablet Place 1 tablet (0.4 mg total) under the tongue every 5 (five) minutes x 3 doses as needed. (Patient taking differently: Place 0.4 mg under the tongue every 5 (five) minutes x 3 doses as needed for chest pain. ) 30 tablet 3  . pantoprazole (PROTONIX) 40 MG tablet Take 1 tablet (40 mg total) by mouth daily. 90 tablet 5  . polyethylene glycol (MIRALAX /  GLYCOLAX) packet Take 17 g by mouth daily as needed for moderate constipation.     . prasugrel (EFFIENT) 10 MG TABS tablet Take 1 tablet (10 mg total) by mouth daily. 15 tablet 3  . sitaGLIPtin (JANUVIA) 100 MG tablet Take 1 tablet (100 mg total) by mouth daily. 90 tablet 3   No current facility-administered medications for this visit.     Past Medical History:  Diagnosis Date  . 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 Arthritis    "lower back" (07/29/2016)  . ASCVD (arteriosclerotic cardiovascular disease)    -luminal irregularities in 1998 and 2005; normal EF  . Benign prostatic hypertrophy    status post transurethral resection of the prostate  . CAD (coronary artery disease)    a. Cath 07/29/16 99%  ostial RCA & 70% pRCA s/p cutting balloon angioplasty and single SYNERGY DES 2.5X28. 100% very Distal 1st RPLB lesion --> the occlusion site moved further distal with guidewire advancement;  40% ostial LAD; 50% Ost Cx to Prox Cx lesion.  . Coronary artery disease   . Dysphagia   . Erectile dysfunction   . GERD (gastroesophageal reflux disease)   . Heart murmur   . History of hiatal hernia   . History of kidney stones   . Hyperlipidemia   . Hypertension   . Seasonal allergies   . Tobacco abuse    20 pack years; discontinued 1995  . Type II diabetes mellitus ([place])     Past Surgical History:  Procedure Laterality Date  . APPENDECTOMY    . CATARACT EXTRACTION W/ INTRAOCULAR LENS  IMPLANT, BILATERAL Bilateral   . COLONOSCOPY  07/20/2011   Dr. Gala Romney: multiple hyperplastic polyps. Surveillance 2018  . COLONOSCOPY N/A 06/30/2016   Procedure: COLONOSCOPY;  Surgeon: Daneil Dolin, MD;  Location: [facility];  Service: Endoscopy;  Laterality: N/A;  10:00am  . COLONOSCOPY W/ BIOPSIES AND POLYPECTOMY  07/08/08, 06/2011   Dr Madolyn Frieze papilla, diminutive rectal polyp ablated the, left-sided diverticula, piecemeal polypectomy of hepatic flexure adenomatous polyp, diminutive polyps near hepatic flexure ablated  . CORONARY ANGIOPLASTY WITH STENT PLACEMENT  07/29/2016  . CORONARY STENT INTERVENTION N/A 07/29/2016   Procedure: Coronary Stent Intervention;  Surgeon: Leonie Man, MD;  Location: [facility];  Service: Cardiovascular;  Laterality: N/A;  RCA  . ESOPHAGOGASTRODUODENOSCOPY  2013   erosive reflux esophagitis, s/p empiric dilation. chronic duodenitis.   [name] LEFT HEART CATH AND CORONARY ANGIOGRAPHY N/A 07/29/2016   Procedure: Left Heart Cath and Coronary Angiography;  Surgeon: Leonie Man, MD;  Location: [facility];  Service: Cardiovascular;  Laterality: N/A;  . NASAL SEPTUM SURGERY    . NASAL SINUS SURGERY     "cleaned out my sinus"  . PERIPHERAL VASCULAR CATHETERIZATION  N/A 11/26/2015   Procedure: Abdominal Aortogram w/Lower Extremity;  Surgeon: Wellington Hampshire, MD;  Location: [facility];  Service: Cardiovascular;  Laterality: N/A;  . TONSILLECTOMY    . TRANSURETHRAL RESECTION OF PROSTATE  2009    Social History   Social History  . Marital status: Married    Spouse name: N/A  . Number of children: 2  . Years of education: N/A   Occupational History  . retired-farmer, [place]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     [employer]   Social History Main Topics  . Smoking status: Former Smoker    Packs/day: 1.00    Years: 15.00    Types: Cigarettes    Start date: 03/08/1965  Quit date: 07/07/1993  . Smokeless tobacco: Never Used  . Alcohol use 0.0 oz/week     Comment: 07/29/2016 "last drink was 4 wks ago; had a beer"  . Drug use: No  . Sexual activity: Not Currently   Other Topics Concern  . Not on file   Social History Narrative  . No narrative on file     Vitals:   10/28/16 1132  BP: (!) 148/64  Pulse: 78  SpO2: 98%  Weight: 159 lb (72.1 kg)  Height: 5\' 7"  (1.702 m)    Wt Readings from Last 3 Encounters:  10/28/16 159 lb (72.1 kg)  10/12/16 174 lb 9.6 oz (79.2 kg)  10/01/16 177 lb 11.1 oz (80.6 kg)     PHYSICAL EXAM General: NAD HEENT: Normal. Neck: No JVD, no thyromegaly. Lungs: Clear to auscultation bilaterally with normal respiratory effort. CV: Nondisplaced PMI.  Regular rate and rhythm, normal S1/S2, no S3/S4, no murmur. No pretibial or periankle edema.  No carotid bruit.   Abdomen: Soft, nontender, no distention.  Neurologic: Alert and oriented.  Psych: Normal affect. Skin: Normal. Musculoskeletal: No gross deformities.    ECG: Most recent ECG reviewed.   Labs: Lab Results  Component Value Date/Time   K 4.6 10/01/2016 02:57 PM   BUN 26 (H) 10/01/2016 02:57 PM   BUN 26 (A) 12/15/2015   CREATININE 1.09 10/01/2016 02:57 PM   CREATININE 0.91 11/18/2015 11:53 AM   ALT 35 09/07/2016 10:55 AM   TSH 1.85  03/04/2016 10:29 AM   HGB 14.1 10/01/2016 02:57 PM     Lipids: Lab Results  Component Value Date/Time   LDLCALC 86 03/04/2016 10:29 AM   LDLCALC 91 03/12/2008   CHOL 141 03/04/2016 10:29 AM   TRIG 63.0 03/04/2016 10:29 AM   TRIG 96 03/12/2008   HDL 42.80 03/04/2016 10:29 AM       ASSESSMENT AND PLAN:  1. Coronary artery disease status post RCA stent in June 2018: Continue dual antiplatelet therapy with aspirin and Effient for 1 year. Continue Lipitor. Given his complaints of lethargy after carvedilol, I will discontinue this.  2. Hypertension: Blood pressure is elevated today. As I am stopping carvedilol, I will increase lisinopril to 20 mg daily.  3. Hyperlipidemia: Continue statin therapy.He prefers to take 5 mg every other day. This is managed by his PCP.  4. Peripheral vascular disease: Continues to walk without difficulty. Continue ASA and Lipitor. He underwent bilateral common iliac artery stent placement in 2017. Followswith Dr. Fletcher Anon.   Disposition: Follow up 3 months.   Kate Sable, M.D., F.A.C.C.

## 2016-10-29 ENCOUNTER — Encounter (HOSPITAL_COMMUNITY)
Admission: RE | Admit: 2016-10-29 | Discharge: 2016-10-29 | Disposition: A | Discharge status: Home / Self Care | Payer: Medicare Other | Source: Ambulatory Visit | Attending: Cardiovascular Disease | Admitting: Cardiovascular Disease

## 2016-10-29 DIAGNOSIS — Z955 Presence of coronary angioplasty implant and graft: Secondary | ICD-10-CM

## 2016-10-29 DIAGNOSIS — Z48812 Encounter for surgical aftercare following surgery on the circulatory system: Secondary | ICD-10-CM | POA: Diagnosis not present

## 2016-10-29 NOTE — Progress Notes (Signed)
Daily Session Note  Patient Details  Name: Marcus Norton MRN: [id number] Date of Birth: Jan 30, 1941 Referring Provider:     CARDIAC REHAB PHASE II ORIENTATION from 10/01/2016 in [place]  Referring Provider  [name]      Encounter Date: 10/29/2016  Check In:     Session Check In - 10/29/16 1545      Check-In   Location [facility]   Staff Present Aundra Dubin, RN, BSN;Gregory Luther Parody, BS, EP, Exercise Physiologist   Supervising physician immediately available to respond to emergencies See telemetry face sheet for immediately available MD   Medication changes reported     Yes   Comments Cardiologist discontinued Coreg and increased Lisinopril to 20 mg daily.   Fall or balance concerns reported    No   Warm-up and Cool-down Performed as group-led instruction   Resistance Training Performed Yes   VAD Patient? No     Pain Assessment   Currently in Pain? No/denies   Pain Score 0-No pain   Multiple Pain Sites No      Capillary Blood Glucose: No results found for this or any previous visit (from the past 24 hour(s)).    History  Smoking Status  . Former Smoker  . Packs/day: 1.00  . Years: 15.00  . Types: Cigarettes  . Start date: 03/08/1965  . Quit date: 07/07/1993  Smokeless Tobacco  . Never Used    Goals Met:  Independence with exercise equipment Exercise tolerated well No report of cardiac concerns or symptoms Strength training completed today  Goals Unmet:  Not Applicable  Comments: Check out 1645.   [name] is Medical Director for [facility] Cardiac and Pulmonary Rehab.

## 2016-11-01 ENCOUNTER — Encounter (HOSPITAL_COMMUNITY)
Admission: RE | Admit: 2016-11-01 | Discharge: 2016-11-01 | Disposition: A | Discharge status: Home / Self Care | Payer: Medicare Other | Source: Ambulatory Visit | Attending: Cardiovascular Disease | Admitting: Cardiovascular Disease

## 2016-11-01 DIAGNOSIS — Z955 Presence of coronary angioplasty implant and graft: Secondary | ICD-10-CM

## 2016-11-01 DIAGNOSIS — Z48812 Encounter for surgical aftercare following surgery on the circulatory system: Secondary | ICD-10-CM | POA: Diagnosis not present

## 2016-11-01 NOTE — Progress Notes (Signed)
Daily Session Note  Patient Details  Name: Marcus Norton MRN: [id number] Date of Birth: 05/07/1940 Referring Provider:     CARDIAC REHAB PHASE II ORIENTATION from 10/01/2016 in [place] CARDIAC RE[facility]ABILITATION  Referring Provider  Dr. Koneswaran      Encounter Date: 11/01/2016  Check In:     Session Check In - 11/01/16 1545      Check-In   Location [facility]   Staff Present  , RN, BSN;Gregory Cowan, BS, EP, Exercise Physiologist   Supervising physician immediately available to respond to emergencies See telemetry face sheet for immediately available MD   Medication changes reported     No   Fall or balance concerns reported    No   Warm-up and Cool-down Performed as group-led instruction   Resistance Training Performed Yes   VAD Patient? No     Pain Assessment   Currently in Pain? No/denies   Pain Score 0-No pain   Multiple Pain Sites No      Capillary Blood Glucose: No results found for this or any previous visit (from the past 24 hour(s)).    History  Smoking Status  . Former Smoker  . Packs/day: 1.00  . Years: 15.00  . Types: Cigarettes  . Start date: 03/08/1965  . Quit date: 07/07/1993  Smokeless Tobacco  . Never Used    Goals Met:  Independence with exercise equipment Exercise tolerated well No report of cardiac concerns or symptoms Strength training completed today  Goals Unmet:  Not Applicable  Comments: Check out 1645.   Dr. Suresh Koneswaran is Medical Director for [facility] Cardiac and Pulmonary Rehab. 

## 2016-11-03 ENCOUNTER — Encounter (HOSPITAL_COMMUNITY)
Admission: RE | Admit: 2016-11-03 | Discharge: 2016-11-03 | Disposition: A | Discharge status: Home / Self Care | Payer: Medicare Other | Source: Ambulatory Visit | Attending: Cardiovascular Disease | Admitting: Cardiovascular Disease

## 2016-11-03 DIAGNOSIS — Z955 Presence of coronary angioplasty implant and graft: Secondary | ICD-10-CM

## 2016-11-03 DIAGNOSIS — Z48812 Encounter for surgical aftercare following surgery on the circulatory system: Secondary | ICD-10-CM | POA: Diagnosis not present

## 2016-11-03 NOTE — Progress Notes (Signed)
Daily Session Note  Patient Details  Name: ALFORD GAMERO MRN: [id number] Date of Birth: 03-Jan-1941 Referring Provider:     CARDIAC REHAB PHASE II ORIENTATION from 10/01/2016 in [place]  Referring Provider  [name]      Encounter Date: 11/03/2016  Check In:     Session Check In - 11/03/16 1545      Check-In   Location [facility]   Staff Present Aundra Dubin, RN, BSN;Gregory Luther Parody, BS, EP, Exercise Physiologist   Supervising physician immediately available to respond to emergencies See telemetry face sheet for immediately available MD   Medication changes reported     No   Fall or balance concerns reported    No   Warm-up and Cool-down Performed as group-led instruction   Resistance Training Performed Yes   VAD Patient? No     Pain Assessment   Currently in Pain? No/denies   Pain Score 0-No pain   Multiple Pain Sites No      Capillary Blood Glucose: No results found for this or any previous visit (from the past 24 hour(s)).    History  Smoking Status  . Former Smoker  . Packs/day: 1.00  . Years: 15.00  . Types: Cigarettes  . Start date: 03/08/1965  . Quit date: 07/07/1993  Smokeless Tobacco  . Never Used    Goals Met:  Independence with exercise equipment Exercise tolerated well No report of cardiac concerns or symptoms Strength training completed today  Goals Unmet:  Not Applicable  Comments: Check out 1645.   [name] is Medical Director for [facility] Cardiac and Pulmonary Rehab.

## 2016-11-04 ENCOUNTER — Ambulatory Visit: Payer: Medicare Other | Admitting: Adult Health

## 2016-11-05 ENCOUNTER — Encounter (HOSPITAL_COMMUNITY)
Admission: RE | Admit: 2016-11-05 | Discharge: 2016-11-05 | Disposition: A | Discharge status: Home / Self Care | Payer: Medicare Other | Source: Ambulatory Visit | Attending: Cardiovascular Disease | Admitting: Cardiovascular Disease

## 2016-11-05 DIAGNOSIS — Z48812 Encounter for surgical aftercare following surgery on the circulatory system: Secondary | ICD-10-CM | POA: Diagnosis not present

## 2016-11-05 DIAGNOSIS — Z955 Presence of coronary angioplasty implant and graft: Secondary | ICD-10-CM

## 2016-11-05 NOTE — Progress Notes (Signed)
Daily Session Note  Patient Details  Name: Marcus Norton MRN: [id number] Date of Birth: 1940-05-12 Referring Provider:     CARDIAC REHAB PHASE II ORIENTATION from 10/01/2016 in [place]  Referring Provider  [name]      Encounter Date: 11/05/2016  Check In:     Session Check In - 11/05/16 1540      Check-In   Location [facility]   Staff Present Aundra Dubin, RN, BSN;Gregory Luther Parody, BS, EP, Exercise Physiologist   Supervising physician immediately available to respond to emergencies See telemetry face sheet for immediately available MD   Medication changes reported     No   Fall or balance concerns reported    No   Warm-up and Cool-down Performed as group-led instruction   Resistance Training Performed Yes   VAD Patient? No     Pain Assessment   Currently in Pain? No/denies   Pain Score 0-No pain   Multiple Pain Sites No      Capillary Blood Glucose: No results found for this or any previous visit (from the past 24 hour(s)).    History  Smoking Status  . Former Smoker  . Packs/day: 1.00  . Years: 15.00  . Types: Cigarettes  . Start date: 03/08/1965  . Quit date: 07/07/1993  Smokeless Tobacco  . Never Used    Goals Met:  Independence with exercise equipment Changing diet to healthy choices, watching portion sizes No report of cardiac concerns or symptoms Strength training completed today  Goals Unmet:  Not Applicable  Comments: Check out 1645.   [name] is Medical Director for [facility] Cardiac and Pulmonary Rehab.

## 2016-11-08 ENCOUNTER — Encounter (HOSPITAL_COMMUNITY)
Admission: RE | Admit: 2016-11-08 | Discharge: 2016-11-08 | Disposition: A | Discharge status: Home / Self Care | Payer: Medicare Other | Source: Ambulatory Visit | Attending: Cardiovascular Disease | Admitting: Cardiovascular Disease

## 2016-11-08 DIAGNOSIS — Z48812 Encounter for surgical aftercare following surgery on the circulatory system: Secondary | ICD-10-CM | POA: Diagnosis not present

## 2016-11-08 DIAGNOSIS — Z955 Presence of coronary angioplasty implant and graft: Secondary | ICD-10-CM

## 2016-11-08 NOTE — Progress Notes (Signed)
Daily Session Note  Patient Details  Name: Marcus Norton MRN: [id number] Date of Birth: 1940-11-02 Referring Provider:     CARDIAC REHAB PHASE II ORIENTATION from 10/01/2016 in [place]  Referring Provider  [name]      Encounter Date: 11/08/2016  Check In:     Session Check In - 11/08/16 1548      Check-In   Location [facility]   Staff Present Diane Angelina Pih, MS, EP, [facility], Exercise Physiologist;Sheilla Maris Wynetta Emery, RN, BSN;Gregory Cowan, BS, EP, Exercise Physiologist   Supervising physician immediately available to respond to emergencies See telemetry face sheet for immediately available MD   Medication changes reported     No   Fall or balance concerns reported    No   Warm-up and Cool-down Performed as group-led instruction   Resistance Training Performed Yes   VAD Patient? No     Pain Assessment   Currently in Pain? No/denies   Pain Score 0-No pain   Multiple Pain Sites No      Capillary Blood Glucose: No results found for this or any previous visit (from the past 24 hour(s)).    History  Smoking Status  . Former Smoker  . Packs/day: 1.00  . Years: 15.00  . Types: Cigarettes  . Start date: 03/08/1965  . Quit date: 07/07/1993  Smokeless Tobacco  . Never Used    Goals Met:  Independence with exercise equipment Exercise tolerated well No report of cardiac concerns or symptoms Strength training completed today  Goals Unmet:  Not Applicable  Comments: Check out 1645.   [name] is Medical Director for [facility] Cardiac and Pulmonary Rehab.

## 2016-11-10 ENCOUNTER — Encounter (HOSPITAL_COMMUNITY)
Admission: RE | Admit: 2016-11-10 | Discharge: 2016-11-10 | Disposition: A | Discharge status: Home / Self Care | Payer: Medicare Other | Source: Ambulatory Visit | Attending: Cardiovascular Disease | Admitting: Cardiovascular Disease

## 2016-11-10 DIAGNOSIS — Z955 Presence of coronary angioplasty implant and graft: Secondary | ICD-10-CM

## 2016-11-10 DIAGNOSIS — Z48812 Encounter for surgical aftercare following surgery on the circulatory system: Secondary | ICD-10-CM | POA: Diagnosis not present

## 2016-11-10 NOTE — Progress Notes (Signed)
Daily Session Note  Patient Details  Name: Marcus Norton MRN: [id number] Date of Birth: 04-Aug-1940 Referring Provider:     CARDIAC REHAB PHASE II ORIENTATION from 10/01/2016 in [place]  Referring Provider  [name]      Encounter Date: 11/10/2016  Check In:     Session Check In - 11/10/16 1545      Check-In   Location [facility]   Staff Present Diane Angelina Pih, MS, EP, [facility], Exercise Physiologist;Jordain Radin Wynetta Emery, RN, BSN   Supervising physician immediately available to respond to emergencies See telemetry face sheet for immediately available MD   Medication changes reported     No   Fall or balance concerns reported    No   Warm-up and Cool-down Performed as group-led instruction   Resistance Training Performed Yes   VAD Patient? No     Pain Assessment   Currently in Pain? No/denies   Pain Score 0-No pain   Multiple Pain Sites No      Capillary Blood Glucose: No results found for this or any previous visit (from the past 24 hour(s)).    History  Smoking Status  . Former Smoker  . Packs/day: 1.00  . Years: 15.00  . Types: Cigarettes  . Start date: 03/08/1965  . Quit date: 07/07/1993  Smokeless Tobacco  . Never Used    Goals Met:  Independence with exercise equipment Exercise tolerated well No report of cardiac concerns or symptoms Strength training completed today  Goals Unmet:  Not Applicable  Comments: Check out 1645.   [name] is Medical Director for [facility] Cardiac and Pulmonary Rehab.

## 2016-11-11 ENCOUNTER — Telehealth: Payer: Self-pay

## 2016-11-11 MED ORDER — METOPROLOL TARTRATE 25 MG PO TABS: 25.0000 mg | ORAL_TABLET | Freq: Two times a day (BID) | ORAL | 3 refills | Status: DC

## 2016-11-11 NOTE — Telephone Encounter (Signed)
-----   Message from Herminio Commons, MD sent at 11/11/2016  8:22 AM EDT ----- Regarding: RE: Increased resting HR in Cardiac Rehab Can we start metoprolol 25 mg BID?  ----- Message ----- From: Dwana Melena, RN Sent: 11/11/2016   7:47 AM To: Herminio Commons, MD Subject: Increased resting HR in Cardiac Rehab          Hey [name],  [place], you saw Izaiah 10/28/16 and discontinued his Carvedilol d/t patient compliant of lethargy. His resting HR prior to this was averaging 75. His HR has been steadily increasing and now averages 100-115 at rest. It also takes him longer to recover after exercise to his resting HR. His B/P readings have been on average 130/50.  His next appointment with you is 02/01/17.  Thanks!!  Jackelyn Poling

## 2016-11-12 ENCOUNTER — Encounter (HOSPITAL_COMMUNITY)
Admission: RE | Admit: 2016-11-12 | Discharge: 2016-11-12 | Disposition: A | Discharge status: Home / Self Care | Payer: Medicare Other | Source: Ambulatory Visit | Attending: Cardiovascular Disease | Admitting: Cardiovascular Disease

## 2016-11-12 DIAGNOSIS — Z955 Presence of coronary angioplasty implant and graft: Secondary | ICD-10-CM

## 2016-11-12 DIAGNOSIS — Z48812 Encounter for surgical aftercare following surgery on the circulatory system: Secondary | ICD-10-CM | POA: Diagnosis not present

## 2016-11-12 NOTE — Progress Notes (Addendum)
Daily Session Note  Patient Details  Name: Marcus Norton MRN: [id number] Date of Birth: Nov 10, 1940 Referring Provider:     CARDIAC REHAB PHASE II ORIENTATION from 10/01/2016 in [place]  Referring Provider  [name]      Encounter Date: 11/12/2016  Check In:     Session Check In - 11/12/16 1543      Check-In   Location [facility]   Staff Present [name], MS, EP, [facility], Exercise Physiologist;Guila Owensby Wynetta Emery, RN, BSN   Supervising physician immediately available to respond to emergencies See telemetry face sheet for immediately available MD   Medication changes reported     Yes   Comments Cardiologist added Metoprolol 25 mg twice daily Thursday d/t elevated HR.    Fall or balance concerns reported    No   Warm-up and Cool-down Performed as group-led instruction   Resistance Training Performed Yes   VAD Patient? No     Pain Assessment   Currently in Pain? No/denies   Pain Score 0-No pain   Multiple Pain Sites No      Capillary Blood Glucose: No results found for this or any previous visit (from the past 24 hour(s)).    History  Smoking Status  . Former Smoker  . Packs/day: 1.00  . Years: 15.00  . Types: Cigarettes  . Start date: 03/08/1965  . Quit date: 07/07/1993  Smokeless Tobacco  . Never Used    Goals Met:  Independence with exercise equipment Exercise tolerated well No report of cardiac concerns or symptoms Strength training completed today  Goals Unmet:  Not Applicable  Comments: Check out 1645.   [name] is Medical Director for [facility] Cardiac and Pulmonary Rehab.

## 2016-11-15 ENCOUNTER — Ambulatory Visit (INDEPENDENT_AMBULATORY_CARE_PROVIDER_SITE_OTHER): Payer: Medicare Other | Admitting: Internal Medicine

## 2016-11-15 ENCOUNTER — Encounter: Payer: Self-pay | Admitting: Internal Medicine

## 2016-11-15 ENCOUNTER — Encounter (HOSPITAL_COMMUNITY)
Admission: RE | Admit: 2016-11-15 | Discharge: 2016-11-15 | Disposition: A | Discharge status: Home / Self Care | Payer: Medicare Other | Source: Ambulatory Visit | Attending: Cardiovascular Disease | Admitting: Cardiovascular Disease

## 2016-11-15 VITALS — BP 128/62 | HR 60 | Ht 66.5 in | Wt 177.0 lb

## 2016-11-15 DIAGNOSIS — E1159 Type 2 diabetes mellitus with other circulatory complications: Secondary | ICD-10-CM | POA: Diagnosis not present

## 2016-11-15 DIAGNOSIS — E784 Other hyperlipidemia: Secondary | ICD-10-CM

## 2016-11-15 DIAGNOSIS — E7849 Other hyperlipidemia: Secondary | ICD-10-CM

## 2016-11-15 DIAGNOSIS — Z48812 Encounter for surgical aftercare following surgery on the circulatory system: Secondary | ICD-10-CM | POA: Diagnosis not present

## 2016-11-15 DIAGNOSIS — E1165 Type 2 diabetes mellitus with hyperglycemia: Secondary | ICD-10-CM | POA: Diagnosis not present

## 2016-11-15 DIAGNOSIS — Z955 Presence of coronary angioplasty implant and graft: Secondary | ICD-10-CM

## 2016-11-15 LAB — POCT GLYCOSYLATED HEMOGLOBIN (HGB A1C): Hemoglobin A1C: 7.3

## 2016-11-15 MED ORDER — LANTUS SOLOSTAR 100 UNIT/ML ~~LOC~~ SOPN: 18.0000 [IU] | PEN_INJECTOR | Freq: Every day | SUBCUTANEOUS | 11 refills | Status: DC

## 2016-11-15 MED ORDER — SITAGLIPTIN PHOSPHATE 100 MG PO TABS: 100.0000 mg | ORAL_TABLET | Freq: Every day | ORAL | 3 refills | Status: DC

## 2016-11-15 NOTE — Progress Notes (Signed)
Patient ID: Marcus Norton, male   DOB: 1940/03/22, 76 y.o.   MRN: [id number]   HPI: Marcus Norton is a 76 y.o.-year-old male, returning for f/u for DM2, dx in 1990, insulin-dependent, uncontrolled, with complications (PAD - s/p stents, CAD - s/p stent, Paroxistic SV tachycardia, DR). Last visit 4 mo ago.  He had a coronary stent placed 07/2016. He is now on Prasugrel, and metoprolol changed from [place] and carvedilol.  Last hemoglobin A1c was: Lab Results  Component Value Date   HGBA1C 6.7 07/15/2016   HGBA1C 7.5 04/16/2016   HGBA1C 7.6 12/31/2015  12/15/2015: HbA1c 7.3%  Pt is on a regimen of: - Metformin 1000 mg 2x a day - Januvia 100 mg in am, before b'fast - started 12/2015 - Lantus 18 units at bedtime  He was on Actos, Glipizide.  Pt checks his sugars 1x a day: - am: 120-150, 165, 170 >> 106-143, 175, 185 >> 119-170, 185, 195 - 2h after b'fast: n/c - before lunch: 106 >> 122, 165 >> n/c >> 145, 150 - 2h after lunch: n/c >> 145 - before dinner: 132-150 >> 150, 191 >> n/c >> 102-143 - 2h after dinner: n/c - bedtime: 164 - nighttime: n/c Lowest sugar was 106 >> 102 ; he has hypoglycemia awareness at 70s.  Highest sugar was 185 >> 195  Glucometer: Accu Chek Aviva Plus  Pt's meals are: - Breakfast: cereal + fruit + coffee - Lunch: Sandwich or veggie lunch - Dinner: Meat + veggies + bread - Snacks: 1-2 times a day, sometimes at bedtime  Exercises regularly at the [facility] - was going every other day, now less often (2 days a week) as he had some CP >> hiatal hernia. He started PPI (protonix 40).  - No CKD, last BUN/creatinine:  Lab Results  Component Value Date   BUN 26 (H) 10/01/2016   BUN 16 09/07/2016   CREATININE 1.09 10/01/2016   CREATININE 0.80 09/07/2016  On Lisinopril. - last set of lipids: Lab Results  Component Value Date   CHOL 141 03/04/2016   HDL 42.80 03/04/2016   LDLCALC 86 03/04/2016   TRIG 63.0 03/04/2016   CHOLHDL 3 03/04/2016  On  Lipitor. - last eye exam was in 05/2016 >> + DR - denies numbness and tingling in his feet.  He takes B12.  ROS: Constitutional: no weight gain/no weight loss, + fatigue, no subjective hyperthermia, no subjective hypothermia, + nocturia Eyes: no blurry vision, no xerophthalmia ENT: no sore throat, no nodules palpated in throat, no dysphagia, no odynophagia, no hoarseness Cardiovascular: + CP/no SOB/no palpitations/no leg swelling Respiratory: no cough/no SOB/no wheezing Gastrointestinal: no N/no V/no D/+ C/no acid reflux Musculoskeletal: no muscle aches/no joint aches Skin: no rashes, no hair loss Neurological: no tremors/no numbness/no tingling/no dizziness, + HA  I reviewed pt's medications, allergies, PMH, social hx, family hx, and changes were documented in the history of present illness. Otherwise, unchanged from my initial visit note.  Past Medical History:  Diagnosis Date  . 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 Arthritis    "lower back" (07/29/2016)  . ASCVD (arteriosclerotic cardiovascular disease)    -luminal irregularities in 1998 and 2005; normal EF  . Benign prostatic hypertrophy    status post transurethral resection of the prostate  . CAD (coronary artery disease)    a. Cath 07/29/16 99% ostial RCA & 70% pRCA s/p cutting balloon angioplasty and single SYNERGY DES 2.5X28. 100% very Distal 1st RPLB lesion --> the occlusion site moved further  distal with guidewire advancement;  40% ostial LAD; 50% Ost Cx to Prox Cx lesion.  . Coronary artery disease   . Dysphagia   . Erectile dysfunction   . GERD (gastroesophageal reflux disease)   . Heart murmur   . History of hiatal hernia   . History of kidney stones   . Hyperlipidemia   . Hypertension   . Seasonal allergies   . Tobacco abuse    20 pack years; discontinued 1995  . Type II diabetes mellitus ([place])    Past Surgical History:  Procedure Laterality Date  . APPENDECTOMY    . CATARACT EXTRACTION W/ INTRAOCULAR LENS   IMPLANT, BILATERAL Bilateral   . COLONOSCOPY  07/20/2011   Dr. Gala Romney: multiple hyperplastic polyps. Surveillance 2018  . COLONOSCOPY N/A 06/30/2016   Procedure: COLONOSCOPY;  Surgeon: Daneil Dolin, MD;  Location[facility];  Service: Endoscopy;  Laterality: N/A;  10:00am  . COLONOSCOPY W/ BIOPSIES AND POLYPECTOMY  07/08/08, 06/2011   Dr Madolyn Frieze papilla, diminutive rectal polyp ablated the, left-sided diverticula, piecemeal polypectomy of hepatic flexure adenomatous polyp, diminutive polyps near hepatic flexure ablated  . CORONARY ANGIOPLASTY WITH STENT PLACEMENT  07/29/2016  . CORONARY STENT INTERVENTION N/A 07/29/2016   Procedure: Coronary Stent Intervention;  Surgeon: Leonie Man, MD;  Location: [place] [facility];  Service: Cardiovascular;  Laterality: N/A;  RCA  . ESOPHAGOGASTRODUODENOSCOPY  2013   erosive reflux esophagitis, s/p empiric dilation. chronic duodenitis.   [name] LEFT HEART CATH AND CORONARY ANGIOGRAPHY N/A 07/29/2016   Procedure: Left Heart Cath and Coronary Angiography;  Surgeon: Leonie Man, MD;  Location: [place] [facility];  Service: Cardiovascular;  Laterality: N/A;  . NASAL SEPTUM SURGERY    . NASAL SINUS SURGERY     "cleaned out my sinus"  . PERIPHERAL VASCULAR CATHETERIZATION N/A 11/26/2015   Procedure: Abdominal Aortogram w/Lower Extremity;  Surgeon: Wellington Hampshire, MD;  Location: [place] [facility];  Service: Cardiovascular;  Laterality: N/A;  . TONSILLECTOMY    . TRANSURETHRAL RESECTION OF PROSTATE  2009   Social History   Social History  . Marital status: Married    Spouse name: N/A  . Number of children: 2   Occupational History  . retired-farmer, 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   Social History Main Topics  . Smoking status: Former Smoker    Packs/day: 1.00    Years: 20.00    Types: Cigarettes    Start date: 03/08/1965    Quit date: 07/07/1993  . Smokeless tobacco: Never Used  . Alcohol use 0.0 oz/week      Comment: beer or mixed drink occasionally  . Drug use: No   Current Outpatient Prescriptions on File Prior to Visit  Medication Sig Dispense Refill  . acetaminophen (TYLENOL) 325 MG tablet Take 325 mg by mouth 2 (two) times daily as needed for mild pain.    [name] alfuzosin (UROXATRAL) 10 MG 24 hr tablet Take 10 mg by mouth daily with breakfast.    . aspirin EC 81 MG tablet Take 81 mg by mouth at bedtime.     [name] atorvastatin (LIPITOR) 10 MG tablet Take 5 mg by mouth every other day.     . Cyanocobalamin (VITAMIN B 12 PO) Take 2,000 Units by mouth daily.     . diazepam (VALIUM) 5 MG tablet TAKE 1 TABLET BY MOUTH AT BEDTIME AS NEEDED FOR SLEEP  5  . docusate sodium (COLACE) 100 MG capsule Take  100 mg by mouth daily.     [name] LANTUS SOLOSTAR 100 UNIT/ML Solostar Pen Inject 18 Units into the skin daily at 10 pm. 15 mL 11  . lisinopril (PRINIVIL,ZESTRIL) 20 MG tablet Take 1 tablet (20 mg total) by mouth daily. 90 tablet 3  . loratadine (CLARITIN) 10 MG tablet Take 10 mg by mouth daily as needed for allergies.     . Magnesium 200 MG TABS Take 1 tablet (200 mg total) by mouth daily. 30 each 6  . metFORMIN (GLUCOPHAGE) 1000 MG tablet Take 1 tablet (1,000 mg total) by mouth 2 (two) times daily with a meal. 180 tablet 3  . metoprolol tartrate (LOPRESSOR) 25 MG tablet Take 1 tablet (25 mg total) by mouth 2 (two) times daily. 180 tablet 3  . nitroGLYCERIN (NITROSTAT) 0.4 MG SL tablet Place 1 tablet (0.4 mg total) under the tongue every 5 (five) minutes x 3 doses as needed. (Patient taking differently: Place 0.4 mg under the tongue every 5 (five) minutes x 3 doses as needed for chest pain. ) 30 tablet 3  . pantoprazole (PROTONIX) 40 MG tablet Take 1 tablet (40 mg total) by mouth daily. 90 tablet 5  . polyethylene glycol (MIRALAX / GLYCOLAX) packet Take 17 g by mouth daily as needed for moderate constipation.     . prasugrel (EFFIENT) 10 MG TABS tablet Take 1 tablet (10 mg total) by mouth daily. 15 tablet 3  .  sitaGLIPtin (JANUVIA) 100 MG tablet Take 1 tablet (100 mg total) by mouth daily. 90 tablet 3   No current facility-administered medications on file prior to visit.    Allergies  Allergen Reactions  . Plavix [Clopidogrel Bisulfate] Shortness Of Breath  . Oxycontin [Oxycodone Hcl] Other (See Comments)    Malaise  . Penicillins Other (See Comments)    [place] Has patient had a PCN reaction causing immediate rash, facial/tongue/throat swelling, SOB or lightheadedness with hypotension: No Has patient had a PCN reaction causing severe rash involving mucus membranes or skin necrosis: No Has patient had a PCN reaction that required hospitalization No Has patient had a PCN reaction occurring within the last 10 years: No If all of the above answers are "NO", then may proceed with Cephalosporin use.   . Pioglitazone Other (See Comments)    Made his chest hurt  . Prednisone Other (See Comments)    Reaction: muscle aches and soreness  . Simvastatin Other (See Comments)    Myalgias   Family History  Problem Relation Age of Onset  . Ovarian cancer Mother   . Colon cancer Sister 43       deceased from colon cancer    PE: BP 128/62 (BP Location: Left Arm, Patient Position: Sitting)   Pulse 60   Ht 5' 6.5" (1.689 m)   Wt 177 lb (80.3 kg)   SpO2 97%   BMI 28.14 kg/m  Wt Readings from Last 3 Encounters:  11/15/16 177 lb (80.3 kg)  10/28/16 159 lb (72.1 kg)  10/12/16 174 lb 9.6 oz (79.2 kg)   Constitutional: overweight, in NAD Eyes: PERRLA, EOMI, no exophthalmos ENT: moist mucous membranes, no thyromegaly, no cervical lymphadenopathy Cardiovascular: RRR, No MRG Respiratory: CTA B Gastrointestinal: abdomen soft, NT, ND, BS+ Musculoskeletal: no deformities, strength intact in all 4 Skin: moist, warm, no rashes Neurological: no tremor with outstretched hands, DTR normal in all 4  ASSESSMENT: 1. DM2, insulin-dependent, uncontrolled, with complications - PAD - s/p stents - CAD -  Paroxistic SV tachycardia -  DR  2. HL  PLAN:  1. Patient with long-standing, Uncontrolled diabetes, on oral diabetic medications and basal insulin, with higher blood sugars in a.m. at this visit due to snacking at night. - We discussed about increasing the Lantus slightly, however, the best option would be to stop or decrease snacking after dinner. He agrees to try this first and then increase Lantus only if still needed afterwards. - A good HbA1c target for him due to age and comorbidities would be <7.5% - For now we'll continue the current medication regimen - I suggested to:  Patient Instructions  Please continue: - Metformin 1000 mg 2x a day - Januvia 100 mg in am, before b'fast  - Lantus 18 units at bedtime   Please try not to snack at night.  If sugars are still high in next month despite stopping the snack at night, increase Lantus to 20 units.  Please return in 3 months with your sugar log.    - today, HbA1c is 7.3% (great!) - continue checking sugars at different times of the day - check 1x a day, rotating checks - advised for yearly eye exams >> he is UTD - Return to clinic in 3 mo with sugar log   2. HL - on Lisinopril - latest lipid panel reviewed >> at goal  Philemon Kingdom, MD PhD [facility] Endocrinology

## 2016-11-15 NOTE — Progress Notes (Signed)
Daily Session Note  Patient Details  Name: Marcus Norton MRN: [id number] Date of Birth: [birth year] Referring Provider:     CARDIAC REHAB PHASE II ORIENTATION from 10/01/2016 in [place]  Referring Provider  [name]      Encounter Date: 11/15/2016  Check In:     Session Check In - 11/15/16 1545      Check-In   Location [facility]   Staff Present Diane Angelina Pih, MS, EP, [facility], Exercise Physiologist;Tattianna Schnarr Wynetta Emery, RN, BSN   Supervising physician immediately available to respond to emergencies See telemetry face sheet for immediately available MD   Medication changes reported     No   Fall or balance concerns reported    No   Warm-up and Cool-down Performed as group-led instruction   Resistance Training Performed Yes   VAD Patient? No     Pain Assessment   Currently in Pain? No/denies   Pain Score 0-No pain   Multiple Pain Sites No      Capillary Blood Glucose: Results for orders placed or performed in visit on 11/15/16 (from the past 24 hour(s))  POCT HgB A1C     Status: None   Collection Time: 11/15/16 10:11 AM  Result Value Ref Range   Hemoglobin A1C 7.3       History  Smoking Status  . Former Smoker  . Packs/day: 1.00  . Years: 15.00  . Types: Cigarettes  . Start date: 03/08/1965  . Quit date: 07/07/1993  Smokeless Tobacco  . Never Used    Goals Met:  Independence with exercise equipment Exercise tolerated well No report of cardiac concerns or symptoms Strength training completed today  Goals Unmet:  Not Applicable  Comments: Check out 1645.   [name] is Medical Director for [facility] Cardiac and Pulmonary Rehab.

## 2016-11-15 NOTE — Patient Instructions (Signed)
Please continue: - Metformin 1000 mg 2x a day - Januvia 100 mg in am, before b'fast  - Lantus 18 units at bedtime   Please try not to snack at night.  If sugars are still high in next month despite stopping the snack at night, increase Lantus to 20 units.  Please return in 3 months with your sugar log.

## 2016-11-15 NOTE — Addendum Note (Signed)
Addended by: Caprice Beaver T on: 11/15/2016 10:12 AM   Modules accepted: Orders

## 2016-11-17 ENCOUNTER — Encounter (HOSPITAL_COMMUNITY)
Admission: RE | Admit: 2016-11-17 | Discharge: 2016-11-17 | Disposition: A | Discharge status: Home / Self Care | Payer: Medicare Other | Source: Ambulatory Visit | Attending: Cardiovascular Disease | Admitting: Cardiovascular Disease

## 2016-11-17 DIAGNOSIS — Z48812 Encounter for surgical aftercare following surgery on the circulatory system: Secondary | ICD-10-CM | POA: Diagnosis not present

## 2016-11-17 DIAGNOSIS — Z955 Presence of coronary angioplasty implant and graft: Secondary | ICD-10-CM

## 2016-11-17 NOTE — Progress Notes (Signed)
Daily Session Note  Patient Details  Name: Marcus Norton MRN: [id number] Date of Birth: Jul 05, 1940 Referring Provider:     CARDIAC REHAB PHASE II ORIENTATION from 10/01/2016 in [place]  Referring Provider  [name]      Encounter Date: 11/17/2016  Check In:     Session Check In - 11/17/16 1545      Check-In   Location [facility]   Staff Present Aundra Dubin, RN, BSN;Diane Coad, MS, EP, [facility], Exercise Physiologist   Supervising physician immediately available to respond to emergencies See telemetry face sheet for immediately available MD   Medication changes reported     No   Fall or balance concerns reported    No   Warm-up and Cool-down Performed as group-led instruction   Resistance Training Performed Yes   VAD Patient? No     Pain Assessment   Currently in Pain? No/denies   Pain Score 0-No pain   Multiple Pain Sites No      Capillary Blood Glucose: No results found for this or any previous visit (from the past 24 hour(s)).    History  Smoking Status  . Former Smoker  . Packs/day: 1.00  . Years: 15.00  . Types: Cigarettes  . Start date: 03/08/1965  . Quit date: 07/07/1993  Smokeless Tobacco  . Never Used    Goals Met:  Independence with exercise equipment Exercise tolerated well No report of cardiac concerns or symptoms Strength training completed today  Goals Unmet:  Not Applicable  Comments: Check out 1645.   [name] is Medical Director for [facility] Cardiac and Pulmonary Rehab.

## 2016-11-19 ENCOUNTER — Encounter (HOSPITAL_COMMUNITY)
Admission: RE | Admit: 2016-11-19 | Discharge: 2016-11-19 | Disposition: A | Discharge status: Home / Self Care | Payer: Medicare Other | Source: Ambulatory Visit | Attending: Cardiovascular Disease | Admitting: Cardiovascular Disease

## 2016-11-19 DIAGNOSIS — Z48812 Encounter for surgical aftercare following surgery on the circulatory system: Secondary | ICD-10-CM | POA: Diagnosis not present

## 2016-11-19 DIAGNOSIS — Z955 Presence of coronary angioplasty implant and graft: Secondary | ICD-10-CM

## 2016-11-19 NOTE — Progress Notes (Signed)
Daily Session Note  Patient Details  Name: Marcus Norton MRN: [id number] Date of Birth: 1940-09-30 Referring Provider:     CARDIAC REHAB PHASE II ORIENTATION from 10/01/2016 in [place]  Referring Provider  [name]      Encounter Date: 11/19/2016  Check In:     Session Check In - 11/19/16 1545      Check-In   Location [facility]   Staff Present Aundra Dubin, RN, BSN;Gregory Luther Parody, BS, EP, Exercise Physiologist   Supervising physician immediately available to respond to emergencies See telemetry face sheet for immediately available MD   Medication changes reported     No   Fall or balance concerns reported    No   Warm-up and Cool-down Performed as group-led instruction   Resistance Training Performed Yes   VAD Patient? No     Pain Assessment   Currently in Pain? No/denies   Pain Score 0-No pain   Multiple Pain Sites No      Capillary Blood Glucose: No results found for this or any previous visit (from the past 24 hour(s)).    History  Smoking Status  . Former Smoker  . Packs/day: 1.00  . Years: 15.00  . Types: Cigarettes  . Start date: 03/08/1965  . Quit date: 07/07/1993  Smokeless Tobacco  . Never Used    Goals Met:  Independence with exercise equipment Exercise tolerated well No report of cardiac concerns or symptoms Strength training completed today  Goals Unmet:  Not Applicable  Comments: Check out 1645.   [name] is Medical Director for [facility] Cardiac and Pulmonary Rehab.

## 2016-11-22 ENCOUNTER — Encounter (HOSPITAL_COMMUNITY)
Admission: RE | Admit: 2016-11-22 | Discharge: 2016-11-22 | Disposition: A | Discharge status: Home / Self Care | Payer: Medicare Other | Source: Ambulatory Visit | Attending: Cardiovascular Disease | Admitting: Cardiovascular Disease

## 2016-11-22 DIAGNOSIS — Z955 Presence of coronary angioplasty implant and graft: Secondary | ICD-10-CM

## 2016-11-22 DIAGNOSIS — Z48812 Encounter for surgical aftercare following surgery on the circulatory system: Secondary | ICD-10-CM | POA: Diagnosis not present

## 2016-11-22 NOTE — Progress Notes (Signed)
Daily Session Note  Patient Details  Name: Marcus Norton MRN: [id number] Date of Birth: Oct 10, 1940 Referring Provider:     CARDIAC REHAB PHASE II ORIENTATION from 10/01/2016 in [place]  Referring Provider  [name]      Encounter Date: 11/22/2016  Check In:     Session Check In - 11/22/16 1545      Check-In   Location [facility]   Staff Present Diane Angelina Pih, MS, EP, [facility], Exercise Physiologist;Averil Digman Wynetta Emery, RN, BSN   Supervising physician immediately available to respond to emergencies See telemetry face sheet for immediately available MD   Medication changes reported     No   Fall or balance concerns reported    No   Warm-up and Cool-down Performed as group-led instruction   Resistance Training Performed Yes   VAD Patient? No     Pain Assessment   Currently in Pain? No/denies   Pain Score 0-No pain   Multiple Pain Sites No      Capillary Blood Glucose: No results found for this or any previous visit (from the past 24 hour(s)).      Exercise Prescription Changes - 11/22/16 1400      Response to Exercise   Blood Pressure (Admit) 136/60   Blood Pressure (Exercise) 180/70   Blood Pressure (Exit) 158/50   Heart Rate (Admit) 75 bpm   Heart Rate (Exercise) 96 bpm   Heart Rate (Exit) 84 bpm   Rating of Perceived Exertion (Exercise) 9   Duration Progress to 30 minutes of  aerobic without signs/symptoms of physical distress   Intensity THRR New  [id number]     Progression   Progression Continue to progress workloads to maintain intensity without signs/symptoms of physical distress.     Resistance Training   Training Prescription Yes   Weight 2   Reps 10-15     Treadmill   MPH 2.3   Grade 0.5   Minutes 15   METs 2.91     NuStep   Level 3   SPM 89   Minutes 20   METs 2.1     Home Exercise Plan   Plans to continue exercise at Home (comment)   Frequency Add 2 additional days to program exercise sessions.       History  Smoking Status  . Former Smoker  . Packs/day: 1.00  . Years: 15.00  . Types: Cigarettes  . Start date: 03/08/1965  . Quit date: 07/07/1993  Smokeless Tobacco  . Never Used    Goals Met:  Independence with exercise equipment Exercise tolerated well No report of cardiac concerns or symptoms Strength training completed today  Goals Unmet:  Not Applicable  Comments: Check out 1645.   [name] is Medical Director for [facility] Cardiac and Pulmonary Rehab.

## 2016-11-24 ENCOUNTER — Encounter (HOSPITAL_COMMUNITY)
Admission: RE | Admit: 2016-11-24 | Discharge: 2016-11-24 | Disposition: A | Discharge status: Home / Self Care | Payer: Medicare Other | Source: Ambulatory Visit | Attending: Cardiovascular Disease | Admitting: Cardiovascular Disease

## 2016-11-24 DIAGNOSIS — Z955 Presence of coronary angioplasty implant and graft: Secondary | ICD-10-CM

## 2016-11-24 DIAGNOSIS — Z48812 Encounter for surgical aftercare following surgery on the circulatory system: Secondary | ICD-10-CM | POA: Diagnosis not present

## 2016-11-24 NOTE — Progress Notes (Signed)
Cardiac Individual Treatment Plan  Patient Details  Name: Marcus Norton MRN: [id number] Date of Birth: [birth year] Referring Provider:     CARDIAC REHAB PHASE II ORIENTATION from 10/01/2016 in [place]  Referring Provider  [name]      Initial Encounter Date:    CARDIAC REHAB PHASE II ORIENTATION from 10/01/2016 in [place]  Date  10/01/16  Referring Provider  [name]      Visit Diagnosis: Status post coronary artery stent placement  Patient's Home Medications on Admission:  Current Outpatient Prescriptions:  .  acetaminophen (TYLENOL) 325 MG tablet, Take 325 mg by mouth 2 (two) times daily as needed for mild pain., Disp: , Rfl:  .  alfuzosin (UROXATRAL) 10 MG 24 hr tablet, Take 10 mg by mouth daily with breakfast., Disp: , Rfl:  .  aspirin EC 81 MG tablet, Take 81 mg by mouth at bedtime. , Disp: , Rfl:  .  atorvastatin (LIPITOR) 10 MG tablet, Take 5 mg by mouth every other day. , Disp: , Rfl:  .  Cyanocobalamin (VITAMIN B 12 PO), Take 2,000 Units by mouth daily. , Disp: , Rfl:  .  diazepam (VALIUM) 5 MG tablet, TAKE 1 TABLET BY MOUTH AT BEDTIME AS NEEDED FOR SLEEP, Disp: , Rfl: 5 .  docusate sodium (COLACE) 100 MG capsule, Take 100 mg by mouth daily. , Disp: , Rfl:  .  LANTUS SOLOSTAR 100 UNIT/ML Solostar Pen, Inject 18 Units into the skin daily at 10 pm., Disp: 15 mL, Rfl: 11 .  lisinopril (PRINIVIL,ZESTRIL) 20 MG tablet, Take 1 tablet (20 mg total) by mouth daily., Disp: 90 tablet, Rfl: 3 .  loratadine (CLARITIN) 10 MG tablet, Take 10 mg by mouth daily as needed for allergies. , Disp: , Rfl:  .  Magnesium 200 MG TABS, Take 1 tablet (200 mg total) by mouth daily., Disp: 30 each, Rfl: 6 .  metFORMIN (GLUCOPHAGE) 1000 MG tablet, Take 1 tablet (1,000 mg total) by mouth 2 (two) times daily with a meal., Disp: 180 tablet, Rfl: 3 .  metoprolol tartrate (LOPRESSOR) 25 MG tablet, Take 1 tablet (25 mg total) by mouth 2 (two)  times daily., Disp: 180 tablet, Rfl: 3 .  nitroGLYCERIN (NITROSTAT) 0.4 MG SL tablet, Place 1 tablet (0.4 mg total) under the tongue every 5 (five) minutes x 3 doses as needed. (Patient taking differently: Place 0.4 mg under the tongue every 5 (five) minutes x 3 doses as needed for chest pain. ), Disp: 30 tablet, Rfl: 3 .  pantoprazole (PROTONIX) 40 MG tablet, Take 1 tablet (40 mg total) by mouth daily., Disp: 90 tablet, Rfl: 5 .  polyethylene glycol (MIRALAX / GLYCOLAX) packet, Take 17 g by mouth daily as needed for moderate constipation. , Disp: , Rfl:  .  prasugrel (EFFIENT) 10 MG TABS tablet, Take 1 tablet (10 mg total) by mouth daily., Disp: 15 tablet, Rfl: 3 .  sitaGLIPtin (JANUVIA) 100 MG tablet, Take 1 tablet (100 mg total) by mouth daily., Disp: 90 tablet, Rfl: 3  Past Medical History: Past Medical History:  Diagnosis Date  . 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 Arthritis    "lower back" (07/29/2016)  . ASCVD (arteriosclerotic cardiovascular disease)    -luminal irregularities in 1998 and 2005; normal EF  . Benign prostatic hypertrophy    status post transurethral resection of the prostate  . CAD (coronary artery disease)    a. Cath 07/29/16 99% ostial RCA & 70% pRCA s/p cutting  balloon angioplasty and single SYNERGY DES 2.5X28. 100% very Distal 1st RPLB lesion --> the occlusion site moved further distal with guidewire advancement;  40% ostial LAD; 50% Ost Cx to Prox Cx lesion.  . Coronary artery disease   . Dysphagia   . Erectile dysfunction   . GERD (gastroesophageal reflux disease)   . Heart murmur   . History of hiatal hernia   . History of kidney stones   . Hyperlipidemia   . Hypertension   . Seasonal allergies   . Tobacco abuse    20 pack years; discontinued 1995  . Type II diabetes mellitus (HCC)     Tobacco Use: History  Smoking Status  . Former Smoker  . Packs/day: 1.00  . Years: 15.00  . Types: Cigarettes  . Start date: 03/08/1965  . Quit date: 07/07/1993   Smokeless Tobacco  . Never Used    Labs: Recent Review Flowsheet Data    Labs for ITP Cardiac and Pulmonary Rehab Latest Ref Rng & Units 12/31/2015 03/04/2016 04/16/2016 07/15/2016 11/15/2016   Cholestrol 0 - 200 mg/dL - 141 - - -   LDLCALC 0 - 99 mg/dL - 86 - - -   HDL >39.00 mg/dL - 42.80 - - -   Trlycerides 0.0 - 149.0 mg/dL - 63.0 - - -   Hemoglobin A1c - 7.6 - 7.5 6.7 7.3      Capillary Blood Glucose: Lab Results  Component Value Date   GLUCAP 232 (H) 07/30/2016   GLUCAP 206 (H) 07/29/2016   GLUCAP 143 (H) 07/29/2016   GLUCAP 169 (H) 07/29/2016   GLUCAP 130 (H) 06/30/2016     Exercise Target Goals:    Exercise Program Goal: Individual exercise prescription set with THRR, safety & activity barriers. Participant demonstrates ability to understand and report RPE using BORG scale, to self-measure pulse accurately, and to acknowledge the importance of the exercise prescription.  Exercise Prescription Goal: Starting with aerobic activity 30 plus minutes a day, 3 days per week for initial exercise prescription. Provide home exercise prescription and guidelines that participant acknowledges understanding prior to discharge.  Activity Barriers & Risk Stratification:     Activity Barriers & Cardiac Risk Stratification - 10/01/16 1504      Activity Barriers & Cardiac Risk Stratification   Activity Barriers None   Cardiac Risk Stratification Moderate      6 Minute Walk:     6 Minute Walk    Row Name 10/01/16 1331         6 Minute Walk   Phase Initial     Distance 1200 feet     Distance % Change 0 %     Walk Time 6 minutes     # of Rest Breaks 0     MPH 2.27     METS 2.74     RPE 9     Perceived Dyspnea  9     VO2 Peak 8.48     Symptoms No     Resting HR 78 bpm     Resting BP 128/50     Max Ex. HR 95 bpm     Max Ex. BP 152/62     2 Minute Post BP 128/50        Oxygen Initial Assessment:   Oxygen Re-Evaluation:   Oxygen Discharge (Final Oxygen  Re-Evaluation):   Initial Exercise Prescription:     Initial Exercise Prescription - 10/01/16 1300      Date of Initial Exercise RX and Referring  Provider   Date 10/01/16   Referring Provider [name]     Treadmill   MPH 1.9   Grade 0   Minutes 15   METs 2.4     NuStep   Level 2   SPM 94   Minutes 20   METs 2.2     Prescription Details   Frequency (times per week) 3   Duration Progress to 30 minutes of continuous aerobic without signs/symptoms of physical distress     Intensity   THRR 40-80% of Max Heartrate 104-117-130   Ratings of Perceived Exertion 11-13   Perceived Dyspnea 0-4     Progression   Progression Continue progressive overload as per policy without signs/symptoms or physical distress.     Resistance Training   Training Prescription Yes   Weight 1   Reps 10-15      Perform Capillary Blood Glucose checks as needed.  Exercise Prescription Changes:      Exercise Prescription Changes    Row Name 10/05/16 1000 10/21/16 0800 11/22/16 1400         Response to Exercise   Blood Pressure (Admit) 130/56 132/50 136/60     Blood Pressure (Exercise) 152/74 150/60 180/70     Blood Pressure (Exit) 112/60 146/50 158/50     Heart Rate (Admit) 97 bpm 70 bpm 75 bpm     Heart Rate (Exercise) 109 bpm 101 bpm 96 bpm     Heart Rate (Exit) 96 bpm 79 bpm 84 bpm     Rating of Perceived Exertion (Exercise) 13 11 9      Duration Progress to 30 minutes of  aerobic without signs/symptoms of physical distress Progress to 30 minutes of  aerobic without signs/symptoms of physical distress Progress to 30 minutes of  aerobic without signs/symptoms of physical distress     Intensity THRR unchanged THRR unchanged THRR New  102-116-129       Progression   Progression Continue to progress workloads to maintain intensity without signs/symptoms of physical distress. Continue to progress workloads to maintain intensity without signs/symptoms of physical distress. Continue to  progress workloads to maintain intensity without signs/symptoms of physical distress.       Resistance Training   Training Prescription Yes Yes Yes     Weight 0 2 2     Reps 10-15 10-15 10-15       Treadmill   MPH 1.9 2.2 2.3     Grade 0 0 0.5     Minutes 15 15 15      METs 2.4 2.68 2.91       NuStep   Level 2 2 3      SPM 81 92 89     Minutes 20 20 20      METs 3.55 2.2 2.1       Home Exercise Plan   Plans to continue exercise at Home (comment) Home (comment) Home (comment)     Frequency Add 2 additional days to program exercise sessions. Add 2 additional days to program exercise sessions. Add 2 additional days to program exercise sessions.        Exercise Comments:      Exercise Comments    Row Name 10/05/16 1018 10/21/16 0803 11/22/16 1422       Exercise Comments Patient has just started CR and will be progressed in time.  Patient is doing well in CR.  Patient is doing well in CR. He has progressed on both treadmill and nustep. He has been given a grade  on teh treadmill and is working that very well.         Exercise Goals and Review:      Exercise Goals    Row Name 10/01/16 1505             Exercise Goals   Increase Physical Activity Yes       Intervention Provide advice, education, support and counseling about physical activity/exercise needs.;Develop an individualized exercise prescription for aerobic and resistive training based on initial evaluation findings, risk stratification, comorbidities and participant's personal goals.       Expected Outcomes Achievement of increased cardiorespiratory fitness and enhanced flexibility, muscular endurance and strength shown through measurements of functional capacity and personal statement of participant.       Increase Strength and Stamina Yes       Intervention Provide advice, education, support and counseling about physical activity/exercise needs.;Develop an individualized exercise prescription for aerobic and  resistive training based on initial evaluation findings, risk stratification, comorbidities and participant's personal goals.       Expected Outcomes Achievement of increased cardiorespiratory fitness and enhanced flexibility, muscular endurance and strength shown through measurements of functional capacity and personal statement of participant.          Exercise Goals Re-Evaluation :     Exercise Goals Re-Evaluation    Row Name 10/27/16 1407 11/22/16 1421           Exercise Goal Re-Evaluation   Exercise Goals Review Increase Physical Activity;Increase Strength and Stamina Increase Physical Activity;Increase Strength and Stamina;Knowledge and understanding of Target Heart Rate Range (THRR)      Comments Patient mets are increasing. He is progressing. Will continue to monitor for progress.  Patient is doing well in CR. He has progressed on both treadmill and nustep. He has been given a grade on teh treadmill and is working that very well.       Expected Outcomes Patient will complete the program with continued increased strength, stamina, and activity. Patient wants to feel better and to have more energy.           Discharge Exercise Prescription (Final Exercise Prescription Changes):     Exercise Prescription Changes - 11/22/16 1400      Response to Exercise   Blood Pressure (Admit) 136/60   Blood Pressure (Exercise) 180/70   Blood Pressure (Exit) 158/50   Heart Rate (Admit) 75 bpm   Heart Rate (Exercise) 96 bpm   Heart Rate (Exit) 84 bpm   Rating of Perceived Exertion (Exercise) 9   Duration Progress to 30 minutes of  aerobic without signs/symptoms of physical distress   Intensity THRR New  102-116-129     Progression   Progression Continue to progress workloads to maintain intensity without signs/symptoms of physical distress.     Resistance Training   Training Prescription Yes   Weight 2   Reps 10-15     Treadmill   MPH 2.3   Grade 0.5   Minutes 15   METs 2.91      NuStep   Level 3   SPM 89   Minutes 20   METs 2.1     Home Exercise Plan   Plans to continue exercise at Home (comment)   Frequency Add 2 additional days to program exercise sessions.      Nutrition:  Target Goals: Understanding of nutrition guidelines, daily intake of sodium 1500mg , cholesterol 200mg , calories 30% from fat and 7% or less from saturated fats, daily to have 5 or  more servings of fruits and vegetables.  Biometrics:     Pre Biometrics - 10/01/16 1333      Pre Biometrics   Height 5\' 7"  (1.702 m)   Weight 177 lb 11.1 oz (80.6 kg)   Waist Circumference 40 inches   Hip Circumference 37 inches   Waist to Hip Ratio 1.08 %   BMI (Calculated) 27.9   Triceps Skinfold 7 mm   % Body Fat 25 %   Grip Strength 80 kg   Flexibility 0 in   Single Leg Stand 2 seconds       Nutrition Therapy Plan and Nutrition Goals:     Nutrition Therapy & Goals - 11/24/16 1306      Nutrition Therapy   RD appointment defered Yes      Nutrition Discharge: Rate Your Plate Scores:     Nutrition Assessments - 10/01/16 1507      MEDFICTS Scores   Pre Score 50      Nutrition Goals Re-Evaluation:   Nutrition Goals Discharge (Final Nutrition Goals Re-Evaluation):   Psychosocial: Target Goals: Acknowledge presence or absence of significant depression and/or stress, maximize coping skills, provide positive support system. Participant is able to verbalize types and ability to use techniques and skills needed for reducing stress and depression.  Initial Review & Psychosocial Screening:     Initial Psych Review & Screening - 10/01/16 1516      Initial Review   Current issues with None Identified     Family Dynamics   Good Support System? Yes     Barriers   Psychosocial barriers to participate in program There are no identifiable barriers or psychosocial needs.     Screening Interventions   Interventions Encouraged to exercise      Quality of Life Scores:      Quality of Life - 10/01/16 1333      Quality of Life Scores   Health/Function Pre 19.04 %   Socioeconomic Pre 28.93 %   Psych/Spiritual Pre 24.86 %   Family Pre 28.8 %   GLOBAL Pre 23.85 %      PHQ-9: Recent Review Flowsheet Data    Depression screen [facility] 2/9 10/01/2016 09/07/2016 08/17/2016 03/04/2016 02/24/2016   Decreased Interest 0 0 0 0 0   Down, Depressed, Hopeless 0 0 0 0 0   PHQ - 2 Score 0 0 0 0 0   Altered sleeping 2 1 1  - -   Tired, decreased energy 3 1 1  - -   Change in appetite 0 0 0 - -   Feeling bad or failure about yourself  0 0 0 - -   Trouble concentrating 0 0 0 - -   Moving slowly or fidgety/restless 0 0 0 - -   Suicidal thoughts 0 0 0 - -   PHQ-9 Score 5 2 2  - -   Difficult doing work/chores Somewhat difficult - - - -     Interpretation of Total Score  Total Score Depression Severity:  1-4 = Minimal depression, 5-9 = Mild depression, 10-14 = Moderate depression, 15-19 = Moderately severe depression, 20-27 = Severe depression   Psychosocial Evaluation and Intervention:     Psychosocial Evaluation - 10/01/16 1517      Psychosocial Evaluation & Interventions   Interventions Encouraged to exercise with the program and follow exercise prescription   Continue Psychosocial Services  No Follow up required      Psychosocial Re-Evaluation:     Psychosocial Re-Evaluation    Row  Name 10/27/16 1408 11/24/16 1311           Psychosocial Re-Evaluation   Current issues with None Identified None Identified      Expected Outcomes Patient will have no psychosocial issues identified at discharge.  There will be no psychosocial issues identified at discharge.       Interventions Encouraged to attend Cardiac Rehabilitation for the exercise Encouraged to attend Cardiac Rehabilitation for the exercise      Continue Psychosocial Services  No Follow up required No Follow up required         Psychosocial Discharge (Final Psychosocial Re-Evaluation):     Psychosocial  Re-Evaluation - 11/24/16 1311      Psychosocial Re-Evaluation   Current issues with None Identified   Expected Outcomes There will be no psychosocial issues identified at discharge.    Interventions Encouraged to attend Cardiac Rehabilitation for the exercise   Continue Psychosocial Services  No Follow up required      Vocational Rehabilitation: Provide vocational rehab assistance to qualifying candidates.   Vocational Rehab Evaluation & Intervention:     Vocational Rehab - 10/01/16 1503      Initial Vocational Rehab Evaluation & Intervention   Assessment shows need for Vocational Rehabilitation No      Education: Education Goals: Education classes will be provided on a weekly basis, covering required topics. Participant will state understanding/return demonstration of topics presented.  Learning Barriers/Preferences:     Learning Barriers/Preferences - 10/01/16 1502      Learning Barriers/Preferences   Learning Barriers None   Learning Preferences Written Material;Verbal Instruction;Group Instruction;Audio      Education Topics: Hypertension, Hypertension Reduction -Define heart disease and high blood pressure. Discus how high blood pressure affects the body and ways to reduce high blood pressure.   Exercise and Your Heart -Discuss why it is important to exercise, the FITT principles of exercise, normal and abnormal responses to exercise, and how to exercise safely.   Angina -Discuss definition of angina, causes of angina, treatment of angina, and how to decrease risk of having angina.   CARDIAC REHAB PHASE II EXERCISE from 11/17/2016 in [place]  Date  10/06/16  Educator  DC      Cardiac Medications -Review what the following cardiac medications are used for, how they affect the body, and side effects that may occur when taking the medications.  Medications include Aspirin, Beta blockers, calcium channel blockers, ACE Inhibitors,  angiotensin receptor blockers, diuretics, digoxin, and antihyperlipidemics.   CARDIAC REHAB PHASE II EXERCISE from 11/17/2016 in [place]  Date  10/13/16  Educator  Etheleen Mayhew  Instruction Review Code  2- meets goals/outcomes      Congestive Heart Failure -Discuss the definition of CHF, how to live with CHF, the signs and symptoms of CHF, and how keep track of weight and sodium intake.   CARDIAC REHAB PHASE II EXERCISE from 11/17/2016 in [place]  Date  10/20/16  Educator  DC  Instruction Review Code  2- Demonstrated Understanding      Heart Disease and Intimacy -Discus the effect sexual activity has on the heart, how changes occur during intimacy as we age, and safety during sexual activity.   CARDIAC REHAB PHASE II EXERCISE from 11/17/2016 in [place]  Date  10/27/16  Educator  DC  Instruction Review Code  2- Demonstrated Understanding      Smoking Cessation / COPD -Discuss different methods to quit smoking, the health  benefits of quitting smoking, and the definition of COPD.   CARDIAC REHAB PHASE II EXERCISE from 11/17/2016 in [place]  Date  11/03/16  Educator  DC  Instruction Review Code  2- Demonstrated Understanding      Nutrition I: Fats -Discuss the types of cholesterol, what cholesterol does to the heart, and how cholesterol levels can be controlled.   CARDIAC REHAB PHASE II EXERCISE from 11/17/2016 in [place]  Date  11/10/16  Educator  DC  Instruction Review Code  2- Demonstrated Understanding      Nutrition II: Labels -Discuss the different components of food labels and how to read food label   CARDIAC REHAB PHASE II EXERCISE from 11/17/2016 in [place] [facility]  Date  11/17/16  Educator  DC  Instruction Review Code  2- Demonstrated Understanding      Heart Parts and Heart Disease -Discuss the anatomy of the heart,  the pathway of blood circulation through the heart, and these are affected by heart disease.   Stress I: Signs and Symptoms -Discuss the causes of stress, how stress may lead to anxiety and depression, and ways to limit stress.   Stress II: Relaxation -Discuss different types of relaxation techniques to limit stress.   Warning Signs of Stroke / TIA -Discuss definition of a stroke, what the signs and symptoms are of a stroke, and how to identify when someone is having stroke.   Knowledge Questionnaire Score:     Knowledge Questionnaire Score - 10/01/16 1503      Knowledge Questionnaire Score   Pre Score 18/24      Core Components/Risk Factors/Patient Goals at Admission:     Personal Goals and Risk Factors at Admission - 10/01/16 1507      Core Components/Risk Factors/Patient Goals on Admission    Weight Management Weight Maintenance   Personal Goal Other Yes   Personal Goal Get stronger, feel better overall   Intervention Attend program 3 x week and supplement with 2 x week at home.    Expected Outcomes Achieve personal goals      Core Components/Risk Factors/Patient Goals Review:      Goals and Risk Factor Review    Row Name 10/01/16 1516 10/27/16 1357 11/24/16 1306         Core Components/Risk Factors/Patient Goals Review   Personal Goals Review Other  Get stronger -  Get stronger. Have more energy. Weight Management/Obesity  Feel better; have more energy.      Review  - Patient has completed 10 sessions gaining 1 lb. He is doing well in the program with some progression. He feels the program is benefiting him. Will continue to monitor for progress.  Patient has completed 22 sessions losing 1 lb. He did not attend the first nutrition class but says he plans to attend the next class. He says he is trying to eat smaller portions since he started. His wife does the cooking and shopping and is not on board with healthy eating. Patient says he does feel better since he  started. He is going to the [facility]  2 days/week. He still feels like his medications need to be adjusted for him to feel at his optimal best. He continues to do well in the program. Will continue to monitor for progress.      Expected Outcomes  - Patient will complete the program meeting her personal goals.  Patient will complete the program and continue to meet his personal goals.  Core Components/Risk Factors/Patient Goals at Discharge (Final Review):      Goals and Risk Factor Review - 11/24/16 1306      Core Components/Risk Factors/Patient Goals Review   Personal Goals Review Weight Management/Obesity  Feel better; have more energy.    Review Patient has completed 22 sessions losing 1 lb. He did not attend the first nutrition class but says he plans to attend the next class. He says he is trying to eat smaller portions since he started. His wife does the cooking and shopping and is not on board with healthy eating. Patient says he does feel better since he started. He is going to the [facility]  2 days/week. He still feels like his medications need to be adjusted for him to feel at his optimal best. He continues to do well in the program. Will continue to monitor for progress.    Expected Outcomes Patient will complete the program and continue to meet his personal goals.       ITP Comments:   Comments: ITP 30 day REVIEW Patient doing well in the program. Will continue to monitor for progress.

## 2016-11-24 NOTE — Progress Notes (Signed)
Daily Session Note  Patient Details  Name: Marcus Norton MRN: [id number] Date of Birth: September 18, 1940 Referring Provider:     CARDIAC REHAB PHASE II ORIENTATION from 10/01/[facility] in [place]  Referring Provider  [name]      Encounter Date: 11/24/2016  Check In:     Session Check In - 11/24/16 1545      Check-In   Location [facility]   Staff Present Diane Angelina Pih, MS, EP, [facility], Exercise Physiologist;Loucille Takach Wynetta Emery, RN, BSN   Supervising physician immediately available to respond to emergencies See telemetry face sheet for immediately available MD   Medication changes reported     No   Fall or balance concerns reported    No   Warm-up and Cool-down Performed as group-led instruction   Resistance Training Performed Yes   VAD Patient? No     Pain Assessment   Currently in Pain? No/denies   Pain Score 0-No pain   Multiple Pain Sites No      Capillary Blood Glucose: No results found for this or any previous visit (from the past 24 hour(s)).    History  Smoking Status  . Former Smoker  . Packs/day: 1.00  . Years: 15.00  . Types: Cigarettes  . Start date: 03/08/1965  . Quit date: 07/07/1993  Smokeless Tobacco  . Never Used    Goals Met:  Independence with exercise equipment Exercise tolerated well No report of cardiac concerns or symptoms Strength training completed today  Goals Unmet:  Not Applicable  Comments: Check out 1645.   [name] is Medical Director for [facility] Cardiac and Pulmonary Rehab.

## 2016-11-26 ENCOUNTER — Encounter (HOSPITAL_COMMUNITY)
Admission: RE | Admit: 2016-11-26 | Discharge: 2016-11-26 | Disposition: A | Discharge status: Home / Self Care | Payer: Medicare Other | Source: Ambulatory Visit | Attending: Cardiovascular Disease | Admitting: Cardiovascular Disease

## 2016-11-26 ENCOUNTER — Ambulatory Visit: Payer: Medicare Other | Admitting: Adult Health

## 2016-11-26 ENCOUNTER — Encounter (HOSPITAL_COMMUNITY): Payer: Medicare Other

## 2016-11-26 DIAGNOSIS — Z955 Presence of coronary angioplasty implant and graft: Secondary | ICD-10-CM

## 2016-11-26 DIAGNOSIS — Z48812 Encounter for surgical aftercare following surgery on the circulatory system: Secondary | ICD-10-CM | POA: Diagnosis not present

## 2016-11-26 NOTE — Progress Notes (Signed)
Daily Session Note  Patient Details  Name: Marcus Norton MRN: [id number] Date of Birth: 05-31-1940 Referring Provider:     CARDIAC REHAB PHASE II ORIENTATION from 10/01/2016 in [place]  Referring Provider  [name]      Encounter Date: 11/26/2016  Check In:     Session Check In - 11/26/16 1545      Check-In   Location [facility]   Staff Present Diane Angelina Pih, MS, EP, [facility], Exercise Physiologist;Jeremey Bascom Wynetta Emery, RN, BSN   Supervising physician immediately available to respond to emergencies See telemetry face sheet for immediately available MD   Medication changes reported     No   Fall or balance concerns reported    No   Warm-up and Cool-down Performed as group-led instruction   Resistance Training Performed Yes   VAD Patient? No     Pain Assessment   Currently in Pain? No/denies   Pain Score 0-No pain   Multiple Pain Sites No      Capillary Blood Glucose: No results found for this or any previous visit (from the past 24 hour(s)).    History  Smoking Status  . Former Smoker  . Packs/day: 1.00  . Years: 15.00  . Types: Cigarettes  . Start date: 03/08/1965  . Quit date: 07/07/1993  Smokeless Tobacco  . Never Used    Goals Met:  Independence with exercise equipment Exercise tolerated well No report of cardiac concerns or symptoms Strength training completed today  Goals Unmet:  Not Applicable  Comments: Check out 1645.   [name] is Medical Director for [facility] Cardiac and Pulmonary Rehab.

## 2016-11-29 ENCOUNTER — Encounter (HOSPITAL_COMMUNITY)
Admission: RE | Admit: 2016-11-29 | Discharge: 2016-11-29 | Disposition: A | Discharge status: Home / Self Care | Payer: Medicare Other | Source: Ambulatory Visit | Attending: Cardiovascular Disease | Admitting: Cardiovascular Disease

## 2016-11-29 DIAGNOSIS — Z955 Presence of coronary angioplasty implant and graft: Secondary | ICD-10-CM

## 2016-11-29 DIAGNOSIS — Z48812 Encounter for surgical aftercare following surgery on the circulatory system: Secondary | ICD-10-CM | POA: Diagnosis not present

## 2016-11-29 NOTE — Progress Notes (Signed)
Daily Session Note  Patient Details  Name: RAYYAN BURLEY MRN: [id number] Date of Birth: Oct 28, 1940 Referring Provider:     CARDIAC REHAB PHASE II ORIENTATION from 10/01/2016 in [place]  Referring Provider  [name]      Encounter Date: 11/29/2016  Check In:     Session Check In - 11/29/16 1545      Check-In   Location [facility]   Staff Present Aundra Dubin, RN, BSN;Gregory Luther Parody, BS, EP, Exercise Physiologist   Supervising physician immediately available to respond to emergencies See telemetry face sheet for immediately available MD   Medication changes reported     No   Fall or balance concerns reported    No   Warm-up and Cool-down Performed as group-led instruction   Resistance Training Performed Yes   VAD Patient? No     Pain Assessment   Currently in Pain? No/denies   Pain Score 0-No pain   Multiple Pain Sites No      Capillary Blood Glucose: No results found for this or any previous visit (from the past 24 hour(s)).    History  Smoking Status  . Former Smoker  . Packs/day: 1.00  . Years: 15.00  . Types: Cigarettes  . Start date: 03/08/1965  . Quit date: 07/07/1993  Smokeless Tobacco  . Never Used    Goals Met:  Independence with exercise equipment Exercise tolerated well No report of cardiac concerns or symptoms Strength training completed today  Goals Unmet:  Not Applicable  Comments: Check out 1645.   [name] is Medical Director for [facility] Cardiac and Pulmonary Rehab.

## 2016-12-01 ENCOUNTER — Encounter (HOSPITAL_COMMUNITY)
Admission: RE | Admit: 2016-12-01 | Discharge: 2016-12-01 | Disposition: A | Discharge status: Home / Self Care | Payer: Medicare Other | Source: Ambulatory Visit | Attending: Cardiovascular Disease | Admitting: Cardiovascular Disease

## 2016-12-01 DIAGNOSIS — Z48812 Encounter for surgical aftercare following surgery on the circulatory system: Secondary | ICD-10-CM | POA: Diagnosis not present

## 2016-12-01 DIAGNOSIS — Z955 Presence of coronary angioplasty implant and graft: Secondary | ICD-10-CM

## 2016-12-01 NOTE — Progress Notes (Signed)
Daily Session Note  Patient Details  Name: Marcus Norton MRN: [id number] Date of Birth: 1941/01/05 Referring Provider:     CARDIAC REHAB PHASE II ORIENTATION from 10/01/2016 in [place]  Referring Provider  [name]      Encounter Date: 12/01/2016  Check In:     Session Check In - 12/01/16 1545      Check-In   Location [facility]   Staff Present Diane Angelina Pih, MS, EP, [facility], Exercise Physiologist;Akia Montalban Wynetta Emery, RN, BSN   Supervising physician immediately available to respond to emergencies See telemetry face sheet for immediately available MD   Medication changes reported     No   Fall or balance concerns reported    No   Warm-up and Cool-down Performed as group-led instruction   Resistance Training Performed Yes   VAD Patient? No     Pain Assessment   Currently in Pain? No/denies   Pain Score 0-No pain   Multiple Pain Sites No      Capillary Blood Glucose: No results found for this or any previous visit (from the past 24 hour(s)).    History  Smoking Status  . Former Smoker  . Packs/day: 1.00  . Years: 15.00  . Types: Cigarettes  . Start date: 03/08/1965  . Quit date: 07/07/1993  Smokeless Tobacco  . Never Used    Goals Met:  Independence with exercise equipment Exercise tolerated well No report of cardiac concerns or symptoms Strength training completed today  Goals Unmet:  Not Applicable  Comments: Check out 1645.   [name] is Medical Director for [facility] Cardiac and Pulmonary Rehab.

## 2016-12-03 ENCOUNTER — Encounter (HOSPITAL_COMMUNITY): Payer: Medicare Other

## 2016-12-06 ENCOUNTER — Encounter (HOSPITAL_COMMUNITY)
Admission: RE | Admit: 2016-12-06 | Discharge: 2016-12-06 | Disposition: A | Discharge status: Home / Self Care | Payer: Medicare Other | Source: Ambulatory Visit | Attending: Cardiovascular Disease | Admitting: Cardiovascular Disease

## 2016-12-06 DIAGNOSIS — Z48812 Encounter for surgical aftercare following surgery on the circulatory system: Secondary | ICD-10-CM | POA: Diagnosis not present

## 2016-12-06 DIAGNOSIS — Z955 Presence of coronary angioplasty implant and graft: Secondary | ICD-10-CM

## 2016-12-06 NOTE — Progress Notes (Signed)
Daily Session Note  Patient Details  Name: Marcus Norton MRN: [id number] Date of Birth: 07/07/1940 Referring Provider:     CARDIAC REHAB PHASE II ORIENTATION from 10/01/2016 in [place]  Referring Provider  [name]      Encounter Date: 12/06/2016  Check In:     Session Check In - 12/06/16 1604      Check-In   Location [facility]   Staff Present [name], MS, EP, [facility], Exercise Physiologist;[name], BS, EP, Exercise Physiologist   Supervising physician immediately available to respond to emergencies See telemetry face sheet for immediately available MD   Medication changes reported     No   Fall or balance concerns reported    No   Warm-up and Cool-down Performed as group-led instruction   Resistance Training Performed Yes   VAD Patient? No     Pain Assessment   Currently in Pain? No/denies   Pain Score 0-No pain   Multiple Pain Sites No      Capillary Blood Glucose: No results found for this or any previous visit (from the past 24 hour(s)).      Exercise Prescription Changes - 12/06/16 1400      Response to Exercise   Blood Pressure (Admit) 150/56   Blood Pressure (Exercise) 147/80   Blood Pressure (Exit) 134/76   Heart Rate (Admit) 75 bpm   Heart Rate (Exercise) 106 bpm   Heart Rate (Exit) 81 bpm   Rating of Perceived Exertion (Exercise) 10   Duration Progress to 30 minutes of  aerobic without signs/symptoms of physical distress   Intensity THRR unchanged  [id number]     Progression   Progression Continue to progress workloads to maintain intensity without signs/symptoms of physical distress.     Resistance Training   Training Prescription Yes   Weight 2   Reps 10-15     Treadmill   MPH 2.2   Grade 0.5   Minutes 15   METs 2.8     NuStep   Level 3   SPM 95   Minutes 20   METs 2.1     Home Exercise Plan   Plans to continue exercise at Home (comment)   Frequency Add 2 additional days  to program exercise sessions.      History  Smoking Status  . Former Smoker  . Packs/day: 1.00  . Years: 15.00  . Types: Cigarettes  . Start date: 03/08/1965  . Quit date: 07/07/1993  Smokeless Tobacco  . Never Used    Goals Met:  Independence with exercise equipment Exercise tolerated well No report of cardiac concerns or symptoms Strength training completed today  Goals Unmet:  None  Comments: Check out 445   [name] is Medical Director for [place] and Pulmonary Rehab.

## 2016-12-07 ENCOUNTER — Other Ambulatory Visit: Payer: Self-pay | Admitting: Adult Health

## 2016-12-08 ENCOUNTER — Telehealth: Payer: Self-pay | Admitting: Cardiovascular Disease

## 2016-12-08 ENCOUNTER — Encounter (HOSPITAL_COMMUNITY)
Admission: RE | Admit: 2016-12-08 | Discharge: 2016-12-08 | Disposition: A | Discharge status: Home / Self Care | Payer: Medicare Other | Source: Ambulatory Visit | Attending: Cardiovascular Disease | Admitting: Cardiovascular Disease

## 2016-12-08 DIAGNOSIS — Z955 Presence of coronary angioplasty implant and graft: Secondary | ICD-10-CM

## 2016-12-08 DIAGNOSIS — Z48812 Encounter for surgical aftercare following surgery on the circulatory system: Secondary | ICD-10-CM | POA: Diagnosis not present

## 2016-12-08 MED ORDER — PRASUGREL HCL 10 MG PO TABS: 10.0000 mg | ORAL_TABLET | Freq: Every day | ORAL | 3 refills | Status: DC

## 2016-12-08 NOTE — Telephone Encounter (Signed)
°*  STAT* If patient is at the pharmacy, call can be transferred to refill team.   1. Which medications need to be refilled? (please list name of each medication and dose if known) prasugrel (EFFIENT) 10 MG TABS tablet [[id number]]    2. Which pharmacy/location (including street and city if local pharmacy) is medication to be sent to? [facility] [place]   3. Do they need a 30 day or 90 day supply?  90 day

## 2016-12-08 NOTE — Progress Notes (Signed)
Daily Session Note  Patient Details  Name: Marcus Norton MRN: [id number] Date of Birth: [birth year] Referring Provider:     CARDIAC REHAB PHASE II ORIENTATION from 10/01/2016 in [place]  Referring Provider  [name]      Encounter Date: 12/08/2016  Check In:     Session Check In - 12/08/16 1545      Check-In   Location [facility]   Staff Present Diane Angelina Pih, MS, EP, [facility], Exercise Physiologist;Lani Mendiola Wynetta Emery, RN, BSN   Supervising physician immediately available to respond to emergencies See telemetry face sheet for immediately available MD   Medication changes reported     No   Fall or balance concerns reported    No   Warm-up and Cool-down Performed as group-led instruction   Resistance Training Performed Yes   VAD Patient? No     Pain Assessment   Currently in Pain? No/denies   Pain Score 0-No pain   Multiple Pain Sites No      Capillary Blood Glucose: No results found for this or any previous visit (from the past 24 hour(s)).    History  Smoking Status  . Former Smoker  . Packs/day: 1.00  . Years: 15.00  . Types: Cigarettes  . Start date: 03/08/1965  . Quit date: 07/07/1993  Smokeless Tobacco  . Never Used    Goals Met:  Independence with exercise equipment Exercise tolerated well No report of cardiac concerns or symptoms Strength training completed today  Goals Unmet:  Not Applicable  Comments: Check out 1645.   [name] is Medical Director for [facility] Cardiac and Pulmonary Rehab.

## 2016-12-10 ENCOUNTER — Encounter (HOSPITAL_COMMUNITY)
Admission: RE | Admit: 2016-12-10 | Discharge: 2016-12-10 | Disposition: A | Discharge status: Home / Self Care | Payer: Medicare Other | Source: Ambulatory Visit | Attending: Cardiovascular Disease | Admitting: Cardiovascular Disease

## 2016-12-10 DIAGNOSIS — Z48812 Encounter for surgical aftercare following surgery on the circulatory system: Secondary | ICD-10-CM | POA: Diagnosis not present

## 2016-12-10 DIAGNOSIS — Z955 Presence of coronary angioplasty implant and graft: Secondary | ICD-10-CM

## 2016-12-10 NOTE — Progress Notes (Signed)
Daily Session Note  Patient Details  Name: ANDERS HOHMANN MRN: [id number] Date of Birth: [birth year] Referring Provider:     CARDIAC REHAB PHASE II ORIENTATION from 10/01/2016 in [place]  Referring Provider  [name]      Encounter Date: 12/10/2016  Check In:     Session Check In - 12/10/16 1545      Check-In   Location [facility]   Staff Present Diane Angelina Pih, MS, EP, [facility], Exercise Physiologist;Michio Thier Wynetta Emery, RN, BSN   Supervising physician immediately available to respond to emergencies See telemetry face sheet for immediately available MD   Medication changes reported     No   Fall or balance concerns reported    No   Warm-up and Cool-down Performed as group-led instruction   Resistance Training Performed Yes   VAD Patient? No     Pain Assessment   Currently in Pain? No/denies   Pain Score 0-No pain   Multiple Pain Sites No      Capillary Blood Glucose: No results found for this or any previous visit (from the past 24 hour(s)).    History  Smoking Status  . Former Smoker  . Packs/day: 1.00  . Years: 15.00  . Types: Cigarettes  . Start date: 03/08/1965  . Quit date: 07/07/1993  Smokeless Tobacco  . Never Used    Goals Met:  Independence with exercise equipment Exercise tolerated well No report of cardiac concerns or symptoms Strength training completed today  Goals Unmet:  Not Applicable  Comments: Check out 1645.   [name] is Medical Director for [facility] Cardiac and Pulmonary Rehab.

## 2016-12-13 ENCOUNTER — Encounter (HOSPITAL_COMMUNITY)
Admission: RE | Admit: 2016-12-13 | Discharge: 2016-12-13 | Disposition: A | Discharge status: Home / Self Care | Payer: Medicare Other | Source: Ambulatory Visit | Attending: Cardiovascular Disease | Admitting: Cardiovascular Disease

## 2016-12-13 DIAGNOSIS — Z48812 Encounter for surgical aftercare following surgery on the circulatory system: Secondary | ICD-10-CM | POA: Diagnosis not present

## 2016-12-13 DIAGNOSIS — Z955 Presence of coronary angioplasty implant and graft: Secondary | ICD-10-CM

## 2016-12-13 NOTE — Progress Notes (Signed)
Daily Session Note  Patient Details  Name: ACELIN FERDIG MRN: [id number] Date of Birth: 1941/02/05 Referring Provider:     CARDIAC REHAB PHASE II ORIENTATION from 10/01/2016 in [place]  Referring Provider  [name]      Encounter Date: 12/13/2016  Check In:     Session Check In - 12/13/16 1545      Check-In   Location [facility]   Staff Present Diane Angelina Pih, MS, EP, [facility], Exercise Physiologist;Misha Antonini Wynetta Emery, RN, BSN   Supervising physician immediately available to respond to emergencies See telemetry face sheet for immediately available MD   Medication changes reported     No   Fall or balance concerns reported    No   Warm-up and Cool-down Performed as group-led instruction   Resistance Training Performed No   VAD Patient? No     Pain Assessment   Currently in Pain? No/denies   Pain Score 0-No pain   Multiple Pain Sites No      Capillary Blood Glucose: No results found for this or any previous visit (from the past 24 hour(s)).    History  Smoking Status  . Former Smoker  . Packs/day: 1.00  . Years: 15.00  . Types: Cigarettes  . Start date: 03/08/1965  . Quit date: 07/07/1993  Smokeless Tobacco  . Never Used    Goals Met:  Independence with exercise equipment Exercise tolerated well No report of cardiac concerns or symptoms Strength training completed today  Goals Unmet:  Not Applicable  Comments: Check out 1645.   [name] is Medical Director for [facility] Cardiac and Pulmonary Rehab.

## 2016-12-14 ENCOUNTER — Encounter: Payer: Self-pay | Admitting: Cardiovascular Disease

## 2016-12-15 ENCOUNTER — Encounter (HOSPITAL_COMMUNITY)
Admission: RE | Admit: 2016-12-15 | Discharge: 2016-12-15 | Disposition: A | Discharge status: Home / Self Care | Payer: Medicare Other | Source: Ambulatory Visit | Attending: Cardiovascular Disease | Admitting: Cardiovascular Disease

## 2016-12-15 DIAGNOSIS — Z48812 Encounter for surgical aftercare following surgery on the circulatory system: Secondary | ICD-10-CM | POA: Diagnosis not present

## 2016-12-15 DIAGNOSIS — Z955 Presence of coronary angioplasty implant and graft: Secondary | ICD-10-CM

## 2016-12-15 NOTE — Progress Notes (Signed)
Daily Session Note  Patient Details  Name: Marcus Norton MRN: [id number] Date of Birth: 1940-12-03 Referring Provider:     CARDIAC REHAB PHASE II ORIENTATION from 10/01/2016 in [place]  Referring Provider  [name]      Encounter Date: 12/15/2016  Check In:     Session Check In - 12/15/16 1537      Check-In   Location [facility]   Staff Present Diane Angelina Pih, MS, EP, [facility], Exercise Physiologist;Jedadiah Abdallah Wynetta Emery, RN, BSN   Supervising physician immediately available to respond to emergencies See telemetry face sheet for immediately available MD   Medication changes reported     No   Fall or balance concerns reported    No   Warm-up and Cool-down Performed as group-led instruction   Resistance Training Performed Yes   VAD Patient? No     Pain Assessment   Currently in Pain? No/denies   Pain Score 0-No pain   Multiple Pain Sites No      Capillary Blood Glucose: No results found for this or any previous visit (from the past 24 hour(s)).    History  Smoking Status  . Former Smoker  . Packs/day: 1.00  . Years: 15.00  . Types: Cigarettes  . Start date: 03/08/1965  . Quit date: 07/07/1993  Smokeless Tobacco  . Never Used    Goals Met:  Independence with exercise equipment Exercise tolerated well No report of cardiac concerns or symptoms Strength training completed today  Goals Unmet:  Not Applicable  Comments: Check out 1645.   [name] is Medical Director for [facility] Cardiac and Pulmonary Rehab.

## 2016-12-16 ENCOUNTER — Telehealth: Payer: Self-pay

## 2016-12-16 MED ORDER — AMLODIPINE BESYLATE 5 MG PO TABS: 5.0000 mg | ORAL_TABLET | Freq: Every day | ORAL | 3 refills | Status: DC

## 2016-12-16 NOTE — Telephone Encounter (Signed)
[id number] 

## 2016-12-16 NOTE — Telephone Encounter (Signed)
-----   Message from Herminio Commons, MD sent at 12/16/2016  8:46 AM EDT ----- Regarding: RE: Consistent Elevated Blood Pressure Thanks for the heads up. I agree with your dietary recommendations. I will start amlodipine 5 mg daily.  [name]  ----- Message ----- From: Dwana Melena, RN Sent: 12/16/2016   8:16 AM To: Herminio Commons, MD Subject: Consistent Elevated Blood Pressure             Marcus Norton is close to completing cardiac rehab. His blood pressure has been consistently elevated over the past month averaging 150/50 at rest. He is concerned about this. I told him I would send you a note. He also admits to eating sausage and ham for breakfast most mornings which he says he plans to stop.   Just FYI  Thanks!!

## 2016-12-16 NOTE — Telephone Encounter (Signed)
I spoke with patient, he will start Amlodipine 5 mg daily, already e-scribed to pharmacy

## 2016-12-17 ENCOUNTER — Encounter (HOSPITAL_COMMUNITY): Payer: Medicare Other

## 2016-12-17 NOTE — Progress Notes (Signed)
Cardiac Individual Treatment Plan  Patient Details  Name: Marcus Norton MRN: [id number] Date of Birth: Aug 29, 1940 Referring Provider:     CARDIAC REHAB PHASE II ORIENTATION from 10/01/2016 in [place]  Referring Provider  [name]      Initial Encounter Date:    CARDIAC REHAB PHASE II ORIENTATION from 10/01/2016 in [place]  Date  10/01/16  Referring Provider  [name]      Visit Diagnosis: Status post coronary artery stent placement  Patient's Home Medications on Admission:  Current Outpatient Prescriptions:  .  acetaminophen (TYLENOL) 325 MG tablet, Take 325 mg by mouth 2 (two) times daily as needed for mild pain., Disp: , Rfl:  .  alfuzosin (UROXATRAL) 10 MG 24 hr tablet, Take 10 mg by mouth daily with breakfast., Disp: , Rfl:  .  amLODipine (NORVASC) 5 MG tablet, Take 1 tablet (5 mg total) by mouth daily., Disp: 90 tablet, Rfl: 3 .  aspirin EC 81 MG tablet, Take 81 mg by mouth at bedtime. , Disp: , Rfl:  .  atorvastatin (LIPITOR) 10 MG tablet, Take 5 mg by mouth every other day. , Disp: , Rfl:  .  Cyanocobalamin (VITAMIN B 12 PO), Take 2,000 Units by mouth daily. , Disp: , Rfl:  .  diazepam (VALIUM) 5 MG tablet, TAKE 1 TABLET BY MOUTH AT BEDTIME AS NEEDED FOR SLEEP, Disp: , Rfl: 5 .  docusate sodium (COLACE) 100 MG capsule, Take 100 mg by mouth daily. , Disp: , Rfl:  .  LANTUS SOLOSTAR 100 UNIT/ML Solostar Pen, Inject 18 Units into the skin daily at 10 pm., Disp: 15 mL, Rfl: 11 .  lisinopril (PRINIVIL,ZESTRIL) 20 MG tablet, Take 1 tablet (20 mg total) by mouth daily., Disp: 90 tablet, Rfl: 3 .  loratadine (CLARITIN) 10 MG tablet, Take 10 mg by mouth daily as needed for allergies. , Disp: , Rfl:  .  Magnesium 200 MG TABS, Take 1 tablet (200 mg total) by mouth daily., Disp: 30 each, Rfl: 6 .  metFORMIN (GLUCOPHAGE) 1000 MG tablet, Take 1 tablet (1,000 mg total) by mouth 2 (two) times daily with a meal., Disp: 180  tablet, Rfl: 3 .  metoprolol tartrate (LOPRESSOR) 25 MG tablet, Take 1 tablet (25 mg total) by mouth 2 (two) times daily., Disp: 180 tablet, Rfl: 3 .  nitroGLYCERIN (NITROSTAT) 0.4 MG SL tablet, Place 1 tablet (0.4 mg total) under the tongue every 5 (five) minutes x 3 doses as needed. (Patient taking differently: Place 0.4 mg under the tongue every 5 (five) minutes x 3 doses as needed for chest pain. ), Disp: 30 tablet, Rfl: 3 .  pantoprazole (PROTONIX) 40 MG tablet, Take 1 tablet (40 mg total) by mouth daily., Disp: 90 tablet, Rfl: 5 .  polyethylene glycol (MIRALAX / GLYCOLAX) packet, Take 17 g by mouth daily as needed for moderate constipation. , Disp: , Rfl:  .  prasugrel (EFFIENT) 10 MG TABS tablet, Take 1 tablet (10 mg total) by mouth daily., Disp: 90 tablet, Rfl: 3 .  sitaGLIPtin (JANUVIA) 100 MG tablet, Take 1 tablet (100 mg total) by mouth daily., Disp: 90 tablet, Rfl: 3  Past Medical History: Past Medical History:  Diagnosis Date  . 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 Arthritis    "lower back" (07/29/2016)  . ASCVD (arteriosclerotic cardiovascular disease)    -luminal irregularities in 1998 and 2005; normal EF  . Benign prostatic hypertrophy    status post transurethral resection of the  prostate  . CAD (coronary artery disease)    a. Cath 07/29/16 99% ostial RCA & 70% pRCA s/p cutting balloon angioplasty and single SYNERGY DES 2.5X28. 100% very Distal 1st RPLB lesion --> the occlusion site moved further distal with guidewire advancement;  40% ostial LAD; 50% Ost Cx to Prox Cx lesion.  . Coronary artery disease   . Dysphagia   . Erectile dysfunction   . GERD (gastroesophageal reflux disease)   . Heart murmur   . History of hiatal hernia   . History of kidney stones   . Hyperlipidemia   . Hypertension   . Seasonal allergies   . Tobacco abuse    20 pack years; discontinued 1995  . Type II diabetes mellitus (HCC)     Tobacco Use: History  Smoking Status  . Former Smoker  .  Packs/day: 1.00  . Years: 15.00  . Types: Cigarettes  . Start date: 03/08/1965  . Quit date: 07/07/1993  Smokeless Tobacco  . Never Used    Labs: Recent Review Flowsheet Data    Labs for ITP Cardiac and Pulmonary Rehab Latest Ref Rng & Units 12/31/2015 03/04/2016 04/16/2016 07/15/2016 11/15/2016   Cholestrol 0 - 200 mg/dL - 141 - - -   LDLCALC 0 - 99 mg/dL - 86 - - -   HDL >39.00 mg/dL - 42.80 - - -   Trlycerides 0.0 - 149.0 mg/dL - 63.0 - - -   Hemoglobin A1c - 7.6 - 7.5 6.7 7.3      Capillary Blood Glucose: Lab Results  Component Value Date   GLUCAP 232 (H) 07/30/2016   GLUCAP 206 (H) 07/29/2016   GLUCAP 143 (H) 07/29/2016   GLUCAP 169 (H) 07/29/2016   GLUCAP 130 (H) 06/30/2016     Exercise Target Goals:    Exercise Program Goal: Individual exercise prescription set with THRR, safety & activity barriers. Participant demonstrates ability to understand and report RPE using BORG scale, to self-measure pulse accurately, and to acknowledge the importance of the exercise prescription.  Exercise Prescription Goal: Starting with aerobic activity 30 plus minutes a day, 3 days per week for initial exercise prescription. Provide home exercise prescription and guidelines that participant acknowledges understanding prior to discharge.  Activity Barriers & Risk Stratification:     Activity Barriers & Cardiac Risk Stratification - 10/01/16 1504      Activity Barriers & Cardiac Risk Stratification   Activity Barriers None   Cardiac Risk Stratification Moderate      6 Minute Walk:     6 Minute Walk    Row Name 10/01/16 1331         6 Minute Walk   Phase Initial     Distance 1200 feet     Distance % Change 0 %     Walk Time 6 minutes     # of Rest Breaks 0     MPH 2.27     METS 2.74     RPE 9     Perceived Dyspnea  9     VO2 Peak 8.48     Symptoms No     Resting HR 78 bpm     Resting BP 128/50     Max Ex. HR 95 bpm     Max Ex. BP 152/62     2 Minute Post BP 128/50         Oxygen Initial Assessment:   Oxygen Re-Evaluation:   Oxygen Discharge (Final Oxygen Re-Evaluation):   Initial Exercise Prescription:  Initial Exercise Prescription - 10/01/16 1300      Date of Initial Exercise RX and Referring Provider   Date 10/01/16   Referring Provider [name]     Treadmill   MPH 1.9   Grade 0   Minutes 15   METs 2.4     NuStep   Level 2   SPM 94   Minutes 20   METs 2.2     Prescription Details   Frequency (times per week) 3   Duration Progress to 30 minutes of continuous aerobic without signs/symptoms of physical distress     Intensity   THRR 40-80% of Max Heartrate 104-117-130   Ratings of Perceived Exertion 11-13   Perceived Dyspnea 0-4     Progression   Progression Continue progressive overload as per policy without signs/symptoms or physical distress.     Resistance Training   Training Prescription Yes   Weight 1   Reps 10-15      Perform Capillary Blood Glucose checks as needed.  Exercise Prescription Changes:      Exercise Prescription Changes    Row Name 10/05/16 1000 10/21/16 0800 11/22/16 1400 12/06/16 1400 12/17/16 1400     Response to Exercise   Blood Pressure (Admit) 130/56 132/50 136/60 150/56 150/50   Blood Pressure (Exercise) 152/74 150/60 180/70 147/80 182/60   Blood Pressure (Exit) 112/60 146/50 158/50 134/76 150/50   Heart Rate (Admit) 97 bpm 70 bpm 75 bpm 75 bpm 77 bpm   Heart Rate (Exercise) 109 bpm 101 bpm 96 bpm 106 bpm 100 bpm   Heart Rate (Exit) 96 bpm 79 bpm 84 bpm 81 bpm 86 bpm   Rating of Perceived Exertion (Exercise) 13 11 9 10 10    Duration Progress to 30 minutes of  aerobic without signs/symptoms of physical distress Progress to 30 minutes of  aerobic without signs/symptoms of physical distress Progress to 30 minutes of  aerobic without signs/symptoms of physical distress Progress to 30 minutes of  aerobic without signs/symptoms of physical distress Progress to 30 minutes of   aerobic without signs/symptoms of physical distress   Intensity THRR unchanged THRR unchanged THRR New  102-116-129 THRR unchanged  102-116-129 THRR unchanged  103-117-130     Progression   Progression Continue to progress workloads to maintain intensity without signs/symptoms of physical distress. Continue to progress workloads to maintain intensity without signs/symptoms of physical distress. Continue to progress workloads to maintain intensity without signs/symptoms of physical distress. Continue to progress workloads to maintain intensity without signs/symptoms of physical distress. Continue to progress workloads to maintain intensity without signs/symptoms of physical distress.     Resistance Training   Training Prescription Yes Yes Yes Yes Yes   Weight 0 2 2 2 4    Reps 10-15 10-15 10-15 10-15 10-15     Treadmill   MPH 1.9 2.2 2.3 2.2 2.2   Grade 0 0 0.5 0.5 0.5   Minutes 15 15 15 15 15    METs 2.4 2.68 2.91 2.8 2.8     NuStep   Level 2 2 3 3 3    SPM 81 92 89 95 97   Minutes 20 20 20 20 20    METs 3.55 2.2 2.1 2.1 2.8     Home Exercise Plan   Plans to continue exercise at Home (comment) Home (comment) Home (comment) Home (comment) Home (comment)   Frequency Add 2 additional days to program exercise sessions. Add 2 additional days to program exercise sessions. Add 2 additional days to program  exercise sessions. Add 2 additional days to program exercise sessions. Add 2 additional days to program exercise sessions.      Exercise Comments:      Exercise Comments    Row Name 10/05/16 1018 10/21/16 0803 11/22/16 1422 12/06/16 1427 12/17/16 1438   Exercise Comments Patient has just started CR and will be progressed in time.  Patient is doing well in CR.  Patient is doing well in CR. He has progressed on both treadmill and nustep. He has been given a grade on teh treadmill and is working that very well.  Patient is doing well in CR. He was decreased on Treadmill due to higher BP. He  has increased his SPM on the Nustep Patient is doing well in PR. He has progressed on both the Nustep and the arm ergometer. He states that he feels that this program is helping him.      Exercise Goals and Review:      Exercise Goals    Row Name 10/01/16 1505             Exercise Goals   Increase Physical Activity Yes       Intervention Provide advice, education, support and counseling about physical activity/exercise needs.;Develop an individualized exercise prescription for aerobic and resistive training based on initial evaluation findings, risk stratification, comorbidities and participant's personal goals.       Expected Outcomes Achievement of increased cardiorespiratory fitness and enhanced flexibility, muscular endurance and strength shown through measurements of functional capacity and personal statement of participant.       Increase Strength and Stamina Yes       Intervention Provide advice, education, support and counseling about physical activity/exercise needs.;Develop an individualized exercise prescription for aerobic and resistive training based on initial evaluation findings, risk stratification, comorbidities and participant's personal goals.       Expected Outcomes Achievement of increased cardiorespiratory fitness and enhanced flexibility, muscular endurance and strength shown through measurements of functional capacity and personal statement of participant.          Exercise Goals Re-Evaluation :     Exercise Goals Re-Evaluation    Row Name 10/27/16 1407 11/22/16 1421 12/17/16 1436         Exercise Goal Re-Evaluation   Exercise Goals Review Increase Physical Activity;Increase Strength and Stamina Increase Physical Activity;Increase Strength and Stamina;Knowledge and understanding of Target Heart Rate Range (THRR) Increase Physical Activity;Increase Strength and Stamina;Knowledge and understanding of Target Heart Rate Range (THRR)     Comments Patient mets are  increasing. He is progressing. Will continue to monitor for progress.  Patient is doing well in CR. He has progressed on both treadmill and nustep. He has been given a grade on teh treadmill and is working that very well.  Patient is doing well in CR. He is maintaining his levels and is keepin ghis SPMs going well. He states that thi sprogram is helping him      Expected Outcomes Patient will complete the program with continued increased strength, stamina, and activity. Patient wants to feel better and to have more energy.  Patient wishes to feel better overall and wants to have more energy          Discharge Exercise Prescription (Final Exercise Prescription Changes):     Exercise Prescription Changes - 12/17/16 1400      Response to Exercise   Blood Pressure (Admit) 150/50   Blood Pressure (Exercise) 182/60   Blood Pressure (Exit) 150/50   Heart Rate (Admit)  77 bpm   Heart Rate (Exercise) 100 bpm   Heart Rate (Exit) 86 bpm   Rating of Perceived Exertion (Exercise) 10   Duration Progress to 30 minutes of  aerobic without signs/symptoms of physical distress   Intensity THRR unchanged  103-117-130     Progression   Progression Continue to progress workloads to maintain intensity without signs/symptoms of physical distress.     Resistance Training   Training Prescription Yes   Weight 4   Reps 10-15     Treadmill   MPH 2.2   Grade 0.5   Minutes 15   METs 2.8     NuStep   Level 3   SPM 97   Minutes 20   METs 2.8     Home Exercise Plan   Plans to continue exercise at Home (comment)   Frequency Add 2 additional days to program exercise sessions.      Nutrition:  Target Goals: Understanding of nutrition guidelines, daily intake of sodium 1500mg , cholesterol 200mg , calories 30% from fat and 7% or less from saturated fats, daily to have 5 or more servings of fruits and vegetables.  Biometrics:     Pre Biometrics - 10/01/16 1333      Pre Biometrics   Height 5\' 7"   (1.702 m)   Weight 177 lb 11.1 oz (80.6 kg)   Waist Circumference 40 inches   Hip Circumference 37 inches   Waist to Hip Ratio 1.08 %   BMI (Calculated) 27.9   Triceps Skinfold 7 mm   % Body Fat 25 %   Grip Strength 80 kg   Flexibility 0 in   Single Leg Stand 2 seconds       Nutrition Therapy Plan and Nutrition Goals:     Nutrition Therapy & Goals - 11/29/16 0811      Personal Nutrition Goals   Nutrition Goal For heart healthy choices add >50% of whole grains, make half their plate fruits and vegetables. Discuss the difference between starchy vegetables and leafy greens, and how leafy vegetables provide fiber, helps maintain healthy weight, helps control blood glucose, and lowers cholesterol.  Discuss purchasing fresh or frozen vegetable to reduce sodium and not to add grease, fat or sugar. Consume <18oz of red meat per week. Consume lean cuts of meats and very little of meats high in sodium and nitrates such as pork and lunch meats. Discussed portion control for all food groups     Intervention Plan   Intervention Nutrition handout(s) given to patient.   Expected Outcomes Short Term Goal: Understand basic principles of dietary content, such as calories, fat, sodium, cholesterol and nutrients.      Nutrition Discharge: Rate Your Plate Scores:     Nutrition Assessments - 10/01/16 1507      MEDFICTS Scores   Pre Score 50      Nutrition Goals Re-Evaluation:     Nutrition Goals Re-Evaluation    Row Name 12/17/16 1459             Goals   Current Weight 176 lb 8 oz (80.1 kg)       Nutrition Goal For heart healthy choices add >50% of whole grains, make half their plate fruits and vegetables. Discuss the difference between starchy vegetables and leafy greens, and how leafy vegetables provide fiber, helps maintain healthy weight, helps control blood glucose, and lowers cholesterol.  Discuss purchasing fresh or frozen vegetable to reduce sodium and not to add grease, fat or  sugar. Consume <18oz  of red meat per week. Consume lean cuts of meats and very little of meats high in sodium and nitrates such as pork and lunch meats. Discussed portion control for all food groups.         Comment Patient continues to work toward meeting his nutrition goals. He is trying to eat smaller portions and stop eating pork and decrease his sugary foods. Will continue to monitor.        Expected Outcome Patient will continue to work toward meeting his nutrition goals.           Nutrition Goals Discharge (Final Nutrition Goals Re-Evaluation):     Nutrition Goals Re-Evaluation - 12/17/16 1459      Goals   Current Weight 176 lb 8 oz (80.1 kg)   Nutrition Goal For heart healthy choices add >50% of whole grains, make half their plate fruits and vegetables. Discuss the difference between starchy vegetables and leafy greens, and how leafy vegetables provide fiber, helps maintain healthy weight, helps control blood glucose, and lowers cholesterol.  Discuss purchasing fresh or frozen vegetable to reduce sodium and not to add grease, fat or sugar. Consume <18oz of red meat per week. Consume lean cuts of meats and very little of meats high in sodium and nitrates such as pork and lunch meats. Discussed portion control for all food groups.     Comment Patient continues to work toward meeting his nutrition goals. He is trying to eat smaller portions and stop eating pork and decrease his sugary foods. Will continue to monitor.    Expected Outcome Patient will continue to work toward meeting his nutrition goals.       Psychosocial: Target Goals: Acknowledge presence or absence of significant depression and/or stress, maximize coping skills, provide positive support system. Participant is able to verbalize types and ability to use techniques and skills needed for reducing stress and depression.  Initial Review & Psychosocial Screening:     Initial Psych Review & Screening - 10/01/16 1516       Initial Review   Current issues with None Identified     Family Dynamics   Good Support System? Yes     Barriers   Psychosocial barriers to participate in program There are no identifiable barriers or psychosocial needs.     Screening Interventions   Interventions Encouraged to exercise      Quality of Life Scores:     Quality of Life - 10/01/16 1333      Quality of Life Scores   Health/Function Pre 19.04 %   Socioeconomic Pre 28.93 %   Psych/Spiritual Pre 24.86 %   Family Pre 28.8 %   GLOBAL Pre 23.85 %      PHQ-9: Recent Review Flowsheet Data    Depression screen  [facility] 2/9 10/01/2016 09/07/2016 08/17/2016 03/04/2016 02/24/2016   Decreased Interest 0 0 0 0 0   Down, Depressed, Hopeless 0 0 0 0 0   PHQ - 2 Score 0 0 0 0 0   Altered sleeping 2 1 1  - -   Tired, decreased energy 3 1 1  - -   Change in appetite 0 0 0 - -   Feeling bad or failure about yourself  0 0 0 - -   Trouble concentrating 0 0 0 - -   Moving slowly or fidgety/restless 0 0 0 - -   Suicidal thoughts 0 0 0 - -   PHQ-9 Score 5 2 2  - -   Difficult doing work/chores Somewhat  difficult - - - -     Interpretation of Total Score  Total Score Depression Severity:  1-4 = Minimal depression, 5-9 = Mild depression, 10-14 = Moderate depression, 15-19 = Moderately severe depression, 20-27 = Severe depression   Psychosocial Evaluation and Intervention:     Psychosocial Evaluation - 10/01/16 1517      Psychosocial Evaluation & Interventions   Interventions Encouraged to exercise with the program and follow exercise prescription   Continue Psychosocial Services  No Follow up required      Psychosocial Re-Evaluation:     Psychosocial Re-Evaluation    [place] Name 10/27/16 1408 11/24/16 1311 12/17/16 1502         Psychosocial Re-Evaluation   Current issues with None Identified None Identified None Identified     Expected Outcomes Patient will have no psychosocial issues identified at discharge.  There will be no  psychosocial issues identified at discharge.  Patient will have no psychosocial issues identified at discharge.      Interventions Encouraged to attend Cardiac Rehabilitation for the exercise Encouraged to attend Cardiac Rehabilitation for the exercise Stress management education;Relaxation education;Encouraged to attend Cardiac Rehabilitation for the exercise     Continue Psychosocial Services  No Follow up required No Follow up required No Follow up required        Psychosocial Discharge (Final Psychosocial Re-Evaluation):     Psychosocial Re-Evaluation - 12/17/16 1502      Psychosocial Re-Evaluation   Current issues with None Identified   Expected Outcomes Patient will have no psychosocial issues identified at discharge.    Interventions Stress management education;Relaxation education;Encouraged to attend Cardiac Rehabilitation for the exercise   Continue Psychosocial Services  No Follow up required      Vocational Rehabilitation: Provide vocational rehab assistance to qualifying candidates.   Vocational Rehab Evaluation & Intervention:     Vocational Rehab - 10/01/16 1503      Initial Vocational Rehab Evaluation & Intervention   Assessment shows need for Vocational Rehabilitation No      Education: Education Goals: Education classes will be provided on a weekly basis, covering required topics. Participant will state understanding/return demonstration of topics presented.  Learning Barriers/Preferences:     Learning Barriers/Preferences - 10/01/16 1502      Learning Barriers/Preferences   Learning Barriers None   Learning Preferences Written Material;Verbal Instruction;Group Instruction;Audio      Education Topics: Hypertension, Hypertension Reduction -Define heart disease and high blood pressure. Discus how high blood pressure affects the body and ways to reduce high blood pressure.   Exercise and Your Heart -Discuss why it is important to exercise, the FITT  principles of exercise, normal and abnormal responses to exercise, and how to exercise safely.   Angina -Discuss definition of angina, causes of angina, treatment of angina, and how to decrease risk of having angina.   CARDIAC REHAB PHASE II EXERCISE from 12/15/2016 in [place]  Date  10/06/16  Educator  DC      Cardiac Medications -Review what the following cardiac medications are used for, how they affect the body, and side effects that may occur when taking the medications.  Medications include Aspirin, Beta blockers, calcium channel blockers, ACE Inhibitors, angiotensin receptor blockers, diuretics, digoxin, and antihyperlipidemics.   CARDIAC REHAB PHASE II EXERCISE from 12/15/2016 in [place]  Date  10/13/16  Educator  Etheleen Mayhew  Instruction Review Code  2- meets goals/outcomes      Congestive Heart Failure -Discuss the definition  of CHF, how to live with CHF, the signs and symptoms of CHF, and how keep track of weight and sodium intake.   CARDIAC REHAB PHASE II EXERCISE from 12/15/2016 in [place]  Date  10/20/16  Educator  DC  Instruction Review Code  2- Demonstrated Understanding      Heart Disease and Intimacy -Discus the effect sexual activity has on the heart, how changes occur during intimacy as we age, and safety during sexual activity.   CARDIAC REHAB PHASE II EXERCISE from 12/15/2016 in [place]  Date  10/27/16  Educator  DC  Instruction Review Code  2- Demonstrated Understanding      Smoking Cessation / COPD -Discuss different methods to quit smoking, the health benefits of quitting smoking, and the definition of COPD.   CARDIAC REHAB PHASE II EXERCISE from 12/15/2016 in [place]  Date  11/03/16  Educator  DC  Instruction Review Code  2- Demonstrated Understanding      Nutrition I: Fats -Discuss the types of cholesterol, what  cholesterol does to the heart, and how cholesterol levels can be controlled.   CARDIAC REHAB PHASE II EXERCISE from 12/15/2016 in [place]  Date  11/10/16  Educator  DC  Instruction Review Code  2- Demonstrated Understanding      Nutrition II: Labels -Discuss the different components of food labels and how to read food label   [place] from 12/15/2016 in [place]  Date  11/17/16  Educator  DC  Instruction Review Code  2- Demonstrated Understanding      Heart Parts and Heart Disease -Discuss the anatomy of the heart, the pathway of blood circulation through the heart, and these are affected by heart disease.   CARDIAC REHAB PHASE II EXERCISE from 12/15/2016 in [place]  Date  11/24/16  Educator  DC  Instruction Review Code  2- Demonstrated Understanding      Stress I: Signs and Symptoms -Discuss the causes of stress, how stress may lead to anxiety and depression, and ways to limit stress.   CARDIAC REHAB PHASE II EXERCISE from 12/15/2016 in [place]  Date  12/01/16  Educator  DC  Instruction Review Code  2- Demonstrated Understanding      Stress II: Relaxation -Discuss different types of relaxation techniques to limit stress.   CARDIAC REHAB PHASE II EXERCISE from 12/15/2016 in [place]  Date  12/08/16  Educator  DC  Instruction Review Code  2- Demonstrated Understanding      Warning Signs of Stroke / TIA -Discuss definition of a stroke, what the signs and symptoms are of a stroke, and how to identify when someone is having stroke.   CARDIAC REHAB PHASE II EXERCISE from 12/15/2016 in [place]  Date  12/15/16  Educator  Dc  Instruction Review Code  2- Demonstrated Understanding      Knowledge Questionnaire Score:     Knowledge Questionnaire Score - 10/01/16 1503      Knowledge Questionnaire  Score   Pre Score 18/24      Core Components/Risk Factors/Patient Goals at Admission:     Personal Goals and Risk Factors at Admission - 10/01/16 1507      Core Components/Risk Factors/Patient Goals on Admission    Weight Management Weight Maintenance   Personal Goal Other Yes   Personal Goal Get stronger, feel better overall   Intervention  Attend program 3 x week and supplement with 2 x week at home.    Expected Outcomes Achieve personal goals      Core Components/Risk Factors/Patient Goals Review:      Goals and Risk Factor Review    Row Name 10/01/16 1516 10/27/16 1357 11/24/16 1306 12/17/16 1451       Core Components/Risk Factors/Patient Goals Review   Personal Goals Review Other  Get stronger -  Get stronger. Have more energy. Weight Management/Obesity  Feel better; have more energy.  -  Get Stronger; have more energy.    Review  - Patient has completed 10 sessions gaining 1 lb. He is doing well in the program with some progression. He feels the program is benefiting him. Will continue to monitor for progress.  Patient has completed 22 sessions losing 1 lb. He did not attend the first nutrition class but says he plans to attend the next class. He says he is trying to eat smaller portions since he started. His wife does the cooking and shopping and is not on board with healthy eating. Patient says he does feel better since he started. He is going to the [facility]  2 days/week. He still feels like his medications need to be adjusted for him to feel at his optimal best. He continues to do well in the program. Will continue to monitor for progress.  Patient has completed 31 sessions losing 1 lb. His reported fasting glucose levels continue to be elevated averaging 150. He has no hgb A1C on file. He blood pressure also remains elevated. Cardiologist contacted 12/16/16 and he added Amlodipine 5 mg daily. Patient says he does not follow his diet as he should. He has been eating more pork  lately but plans to stop. He has done well exercising and continues to progress. He says he has more energy and has felt better in the past few weeks than he has since his procedure. He is exercising at the [facility] on the days he does not come to [place]. Will continue to monitor for progress.      Expected Outcomes  - Patient will complete the program meeting her personal goals.  Patient will complete the program and continue to meet his personal goals.  Patient will complete the program and continue to gain strength and energy and continue to work toward adhering to his diabetic and heart healthy diet.        Core Components/Risk Factors/Patient Goals at Discharge (Final Review):      Goals and Risk Factor Review - 12/17/16 1451      Core Components/Risk Factors/Patient Goals Review   Personal Goals Review --  Get Stronger; have more energy.   Review Patient has completed 31 sessions losing 1 lb. His reported fasting glucose levels continue to be elevated averaging 150. He has no hgb A1C on file. He blood pressure also remains elevated. Cardiologist contacted 12/16/16 and he added Amlodipine 5 mg daily. Patient says he does not follow his diet as he should. He has been eating more pork lately but plans to stop. He has done well exercising and continues to progress. He says he has more energy and has felt better in the past few weeks than he has since his procedure. He is exercising at the [facility] on the days he does not come to [place]. Will continue to monitor for progress.     Expected Outcomes Patient will complete the program and continue to gain strength and energy and continue to  work toward adhering to his diabetic and heart healthy diet.       ITP Comments:   Comments: ITP 30 Day REVIEW Patient doing well in the program. Will continue to monitor for progress.

## 2016-12-20 ENCOUNTER — Encounter (HOSPITAL_COMMUNITY)
Admission: RE | Admit: 2016-12-20 | Discharge: 2016-12-20 | Disposition: A | Discharge status: Home / Self Care | Payer: Medicare Other | Source: Ambulatory Visit | Attending: Cardiovascular Disease | Admitting: Cardiovascular Disease

## 2016-12-20 DIAGNOSIS — Z955 Presence of coronary angioplasty implant and graft: Secondary | ICD-10-CM

## 2016-12-20 DIAGNOSIS — Z48812 Encounter for surgical aftercare following surgery on the circulatory system: Secondary | ICD-10-CM | POA: Diagnosis not present

## 2016-12-20 NOTE — Progress Notes (Signed)
Daily Session Note  Patient Details  Name: LATIF NAZARENO MRN: [id number] Date of Birth: 21-Mar-1940 Referring Provider:     CARDIAC REHAB PHASE II ORIENTATION from 10/01/2016 in [place]  Referring Provider  [name]      Encounter Date: 12/20/2016  Check In:     Session Check In - 12/20/16 1541      Check-In   Location [facility]   Staff Present Diane Angelina Pih, MS, EP, [facility], Exercise Physiologist;Eugina Row Wynetta Emery, RN, BSN   Supervising physician immediately available to respond to emergencies See telemetry face sheet for immediately available MD   Medication changes reported     No   Fall or balance concerns reported    No   Warm-up and Cool-down Performed as group-led instruction   Resistance Training Performed Yes   VAD Patient? No     Pain Assessment   Currently in Pain? No/denies   Pain Score 0-No pain   Multiple Pain Sites No      Capillary Blood Glucose: No results found for this or any previous visit (from the past 24 hour(s)).    History  Smoking Status  . Former Smoker  . Packs/day: 1.00  . Years: 15.00  . Types: Cigarettes  . Start date: 03/08/1965  . Quit date: 07/07/1993  Smokeless Tobacco  . Never Used    Goals Met:  Independence with exercise equipment Exercise tolerated well No report of cardiac concerns or symptoms Strength training completed today  Goals Unmet:  Not Applicable  Comments: Check out 1645.   [name] is Medical Director for [facility] Cardiac and Pulmonary Rehab.

## 2016-12-22 ENCOUNTER — Encounter (HOSPITAL_COMMUNITY)
Admission: RE | Admit: 2016-12-22 | Discharge: 2016-12-22 | Disposition: A | Discharge status: Home / Self Care | Payer: Medicare Other | Source: Ambulatory Visit | Attending: Cardiovascular Disease | Admitting: Cardiovascular Disease

## 2016-12-22 DIAGNOSIS — Z48812 Encounter for surgical aftercare following surgery on the circulatory system: Secondary | ICD-10-CM | POA: Diagnosis not present

## 2016-12-22 DIAGNOSIS — Z955 Presence of coronary angioplasty implant and graft: Secondary | ICD-10-CM

## 2016-12-22 NOTE — Progress Notes (Signed)
Daily Session Note  Patient Details  Name: Marcus Norton MRN: [id number] Date of Birth: 31-May-1940 Referring Provider:     CARDIAC REHAB PHASE II ORIENTATION from 10/01/2016 in [place]  Referring Provider  [name]      Encounter Date: 12/22/2016  Check In:     Session Check In - 12/22/16 1537      Check-In   Location [facility]   Staff Present Diane Angelina Pih, MS, EP, [facility], Exercise Physiologist;Analisse Randle Wynetta Emery, RN, BSN   Supervising physician immediately available to respond to emergencies See telemetry face sheet for immediately available MD   Medication changes reported     No   Fall or balance concerns reported    No   Warm-up and Cool-down Performed as group-led instruction   Resistance Training Performed Yes   VAD Patient? No     Pain Assessment   Currently in Pain? No/denies   Pain Score 0-No pain   Multiple Pain Sites No      Capillary Blood Glucose: No results found for this or any previous visit (from the past 24 hour(s)).    History  Smoking Status  . Former Smoker  . Packs/day: 1.00  . Years: 15.00  . Types: Cigarettes  . Start date: 03/08/1965  . Quit date: 07/07/1993  Smokeless Tobacco  . Never Used    Goals Met:  Independence with exercise equipment Exercise tolerated well No report of cardiac concerns or symptoms Strength training completed today  Goals Unmet:  Not Applicable  Comments: Check out 1645.   [name] is Medical Director for [facility] Cardiac and Pulmonary Rehab.

## 2016-12-24 ENCOUNTER — Encounter (HOSPITAL_COMMUNITY)
Admission: RE | Admit: 2016-12-24 | Discharge: 2016-12-24 | Disposition: A | Discharge status: Home / Self Care | Payer: Medicare Other | Source: Ambulatory Visit | Attending: Cardiovascular Disease | Admitting: Cardiovascular Disease

## 2016-12-24 DIAGNOSIS — Z955 Presence of coronary angioplasty implant and graft: Secondary | ICD-10-CM

## 2016-12-24 DIAGNOSIS — Z48812 Encounter for surgical aftercare following surgery on the circulatory system: Secondary | ICD-10-CM | POA: Insufficient documentation

## 2016-12-24 NOTE — Progress Notes (Signed)
Daily Session Note  Patient Details  Name: CLEATUS GABRIEL MRN: [id number] Date of Birth: 02-01-1941 Referring Provider:     CARDIAC REHAB PHASE II ORIENTATION from 10/01/2016 in [place]  Referring Provider  [name]      Encounter Date: 12/24/2016  Check In:     Session Check In - 12/24/16 1545      Check-In   Location [facility]   Staff Present Diane Angelina Pih, MS, EP, [facility], Exercise Physiologist;Tanaia Hawkey Wynetta Emery, RN, BSN   Supervising physician immediately available to respond to emergencies See telemetry face sheet for immediately available MD   Medication changes reported     No   Fall or balance concerns reported    No   Warm-up and Cool-down Performed as group-led instruction   Resistance Training Performed Yes   VAD Patient? No     Pain Assessment   Currently in Pain? No/denies   Pain Score 0-No pain   Multiple Pain Sites No      Capillary Blood Glucose: No results found for this or any previous visit (from the past 24 hour(s)).    History  Smoking Status  . Former Smoker  . Packs/day: 1.00  . Years: 15.00  . Types: Cigarettes  . Start date: 03/08/1965  . Quit date: 07/07/1993  Smokeless Tobacco  . Never Used    Goals Met:  Independence with exercise equipment Exercise tolerated well No report of cardiac concerns or symptoms Strength training completed today  Goals Unmet:  Not Applicable  Comments: Check out 1645.   [name] is Medical Director for [facility] Cardiac and Pulmonary Rehab.

## 2016-12-27 ENCOUNTER — Encounter (HOSPITAL_COMMUNITY)
Admission: RE | Admit: 2016-12-27 | Discharge: 2016-12-27 | Disposition: A | Discharge status: Home / Self Care | Payer: Medicare Other | Source: Ambulatory Visit | Attending: Cardiovascular Disease | Admitting: Cardiovascular Disease

## 2016-12-27 DIAGNOSIS — Z955 Presence of coronary angioplasty implant and graft: Secondary | ICD-10-CM

## 2016-12-27 DIAGNOSIS — Z48812 Encounter for surgical aftercare following surgery on the circulatory system: Secondary | ICD-10-CM | POA: Diagnosis not present

## 2016-12-27 NOTE — Progress Notes (Signed)
Daily Session Note  Patient Details  Name: Marcus Norton MRN: [id number] Date of Birth: 12-07-1940 Referring Provider:     CARDIAC REHAB PHASE II ORIENTATION from 10/01/2016 in [place]  Referring Provider  [name]      Encounter Date: 12/27/2016  Check In: Session Check In - 12/27/16 1541      Check-In   Location  [facility]    Staff Present  Diane Angelina Pih, MS, EP, [facility], Exercise Physiologist;Ermelinda Eckert Wynetta Emery, RN, BSN    Supervising physician immediately available to respond to emergencies  See telemetry face sheet for immediately available MD    Medication changes reported      No    Fall or balance concerns reported     No    Warm-up and Cool-down  Performed as group-led instruction    Resistance Training Performed  Yes    VAD Patient?  No      Pain Assessment   Currently in Pain?  No/denies    Pain Score  0-No pain    Multiple Pain Sites  No       Capillary Blood Glucose: No results found for this or any previous visit (from the past 24 hour(s)).    Social History   Tobacco Use  Smoking Status Former Smoker  . Packs/day: 1.00  . Years: 15.00  . Pack years: 15.00  . Types: Cigarettes  . Start date: 03/08/1965  . Last attempt to quit: 07/07/1993  . Years since quitting: 23.4  Smokeless Tobacco Never Used    Goals Met:  Independence with exercise equipment Exercise tolerated well No report of cardiac concerns or symptoms Strength training completed today  Goals Unmet:  Not Applicable  Comments: Check out 1645.   [name] is Medical Director for [facility] Cardiac and Pulmonary Rehab.

## 2016-12-29 ENCOUNTER — Encounter (HOSPITAL_COMMUNITY)
Admission: RE | Admit: 2016-12-29 | Discharge: 2016-12-29 | Disposition: A | Discharge status: Home / Self Care | Payer: Medicare Other | Source: Ambulatory Visit | Attending: Cardiovascular Disease | Admitting: Cardiovascular Disease

## 2016-12-29 VITALS — Ht 67.0 in | Wt 176.5 lb

## 2016-12-29 DIAGNOSIS — Z955 Presence of coronary angioplasty implant and graft: Secondary | ICD-10-CM

## 2016-12-29 DIAGNOSIS — Z48812 Encounter for surgical aftercare following surgery on the circulatory system: Secondary | ICD-10-CM | POA: Diagnosis not present

## 2016-12-29 NOTE — Progress Notes (Signed)
Daily Session Note  Patient Details  Name: Marcus Norton MRN: [id number] Date of Birth: [birth year] Referring Provider:     CARDIAC REHAB PHASE II ORIENTATION from 10/01/2016 in [place]  Referring Provider  [name]      Encounter Date: 12/29/2016  Check In: Session Check In - 12/29/16 1536      Check-In   Location  [facility]    Staff Present  Diane Angelina Pih, MS, EP, [facility], Exercise Physiologist;Correll Denbow Wynetta Emery, RN, BSN    Supervising physician immediately available to respond to emergencies  See telemetry face sheet for immediately available MD    Medication changes reported      No    Fall or balance concerns reported     No    Warm-up and Cool-down  Performed as group-led instruction    Resistance Training Performed  Yes    VAD Patient?  No      Pain Assessment   Currently in Pain?  No/denies    Pain Score  0-No pain    Multiple Pain Sites  No       Capillary Blood Glucose: No results found for this or any previous visit (from the past 24 hour(s)).    Social History   Tobacco Use  Smoking Status Former Smoker  . Packs/day: 1.00  . Years: 15.00  . Pack years: 15.00  . Types: Cigarettes  . Start date: 03/08/1965  . Last attempt to quit: 07/07/1993  . Years since quitting: 23.4  Smokeless Tobacco Never Used    Goals Met:  Independence with exercise equipment Exercise tolerated well No report of cardiac concerns or symptoms Strength training completed today  Goals Unmet:  Not Applicable  Comments: Check out 1645.   [name] is Medical Director for [facility] Cardiac and Pulmonary Rehab.

## 2016-12-31 ENCOUNTER — Encounter (HOSPITAL_COMMUNITY): Payer: Medicare Other

## 2017-01-03 NOTE — Progress Notes (Signed)
Discharge Progress Report  Patient Details  Name: Marcus Norton MRN: [id number] Date of Birth: 09-06-1940 Referring Provider:     CARDIAC REHAB PHASE II ORIENTATION from 10/01/2016 in [place]  Referring Provider  [name]       Number of Visits: 36  Reason for Discharge:  Patient reached a stable level of exercise. Patient independent in their exercise. Patient has met program and personal goals.  Smoking History:  Social History   Tobacco Use  Smoking Status Former Smoker  . Packs/day: 1.00  . Years: 15.00  . Pack years: 15.00  . Types: Cigarettes  . Start date: 03/08/1965  . Last attempt to quit: 07/07/1993  . Years since quitting: 23.5  Smokeless Tobacco Never Used    Diagnosis:  Status post coronary artery stent placement  ADL UCSD:   Initial Exercise Prescription: Initial Exercise Prescription - 10/01/16 1300      Date of Initial Exercise RX and Referring Provider   Date  10/01/16    Referring Provider  [name]      Treadmill   MPH  1.9    Grade  0    Minutes  15    METs  2.4      NuStep   Level  2    SPM  94    Minutes  20    METs  2.2      Prescription Details   Frequency (times per week)  3    Duration  Progress to 30 minutes of continuous aerobic without signs/symptoms of physical distress      Intensity   THRR 40-80% of Max Heartrate  104-117-130    Ratings of Perceived Exertion  11-13    Perceived Dyspnea  0-4      Progression   Progression  Continue progressive overload as per policy without signs/symptoms or physical distress.      Resistance Training   Training Prescription  Yes    Weight  1    Reps  10-15       Discharge Exercise Prescription (Final Exercise Prescription Changes): Exercise Prescription Changes - 12/17/16 1400      Response to Exercise   Blood Pressure (Admit)  150/50    Blood Pressure (Exercise)  182/60    Blood Pressure (Exit)  150/50    Heart Rate (Admit)  77  bpm    Heart Rate (Exercise)  100 bpm    Heart Rate (Exit)  86 bpm    Rating of Perceived Exertion (Exercise)  10    Duration  Progress to 30 minutes of  aerobic without signs/symptoms of physical distress    Intensity  THRR unchanged 103-117-130   103-117-130     Progression   Progression  Continue to progress workloads to maintain intensity without signs/symptoms of physical distress.      Resistance Training   Training Prescription  Yes    Weight  4    Reps  10-15      Treadmill   MPH  2.2    Grade  0.5    Minutes  15    METs  2.8      NuStep   Level  3    SPM  97    Minutes  20    METs  2.8      Home Exercise Plan   Plans to continue exercise at  Home (comment)    Frequency  Add 2 additional days to program exercise sessions.  Functional Capacity: 6 Minute Walk    Row Name 10/01/16 1331 01/03/17 0737       6 Minute Walk   Phase  Initial  Discharge    Distance  1200 feet  1300 feet    Distance % Change  0 %  8.33 %    Distance Feet Change  -  100 ft    Walk Time  6 minutes  6 minutes    # of Rest Breaks  0  0    MPH  2.27  2.46    METS  2.74  2.88    RPE  9  13    Perceived Dyspnea   9  9    VO2 Peak  8.48  8.64    Symptoms  No  No    Resting HR  78 bpm  78 bpm    Resting BP  128/50  130/50    Resting Oxygen Saturation   -  98 %    Exercise Oxygen Saturation  during 6 min walk  -  98 %    Max Ex. HR  95 bpm  90 bpm    Max Ex. BP  152/62  140/82    2 Minute Post BP  128/50  130/50       Psychological, QOL, Others - Outcomes: PHQ 2/9: Depression screen [facility] 2/9 01/03/2017 10/01/2016 09/07/2016 08/17/2016 03/04/2016  Decreased Interest 0 0 0 0 0  Down, Depressed, Hopeless 0 0 0 0 0  PHQ - 2 Score 0 0 0 0 0  Altered sleeping '3 2 1 1 ' -  Tired, decreased energy '1 3 1 1 ' -  Change in appetite 0 0 0 0 -  Feeling bad or failure about yourself  0 0 0 0 -  Trouble concentrating 0 0 0 0 -  Moving slowly or fidgety/restless 0 0 0 0 -  Suicidal thoughts 0  0 0 0 -  PHQ-9 Score '4 5 2 2 ' -  Difficult doing work/chores Not difficult at all Somewhat difficult - - -    Quality of Life: Quality of Life - 01/03/17 0740      Quality of Life Scores   Health/Function Pre  19.04 %    Health/Function Post  25.23 %    Health/Function % Change  32.51 %    Socioeconomic Pre  28.93 %    Socioeconomic Post  27.14 %    Socioeconomic % Change   -6.19 %    Psych/Spiritual Pre  24.86 %    Psych/Spiritual Post  30 %    Psych/Spiritual % Change  20.68 %    Family Pre  28.8 %    Family Post  27.6 %    Family % Change  -4.17 %    GLOBAL Pre  23.85 %    GLOBAL Post  27.37 %    GLOBAL % Change  14.76 %       Personal Goals: Goals established at orientation with interventions provided to work toward goal. Personal Goals and Risk Factors at Admission - 10/01/16 1507      Core Components/Risk Factors/Patient Goals on Admission    Weight Management  Weight Maintenance    Personal Goal Other  Yes    Personal Goal  Get stronger, feel better overall    Intervention  Attend program 3 x week and supplement with 2 x week at home.     Expected Outcomes  Achieve personal goals  Personal Goals Discharge: Goals and Risk Factor Review    Row Name 10/01/16 1516 10/27/16 1357 11/24/16 1306 12/17/16 1451 01/03/17 1619     Core Components/Risk Factors/Patient Goals Review   Personal Goals Review  Other Get stronger  - Get stronger. Have more energy.  Weight Management/Obesity Feel better; have more energy.   - Get Stronger; have more energy.  Weight Management/Obesity;Diabetes;Hypertension Feel better and have more energy.   Review  -  Patient has completed 10 sessions gaining 1 lb. He is doing well in the program with some progression. He feels the program is benefiting him. Will continue to monitor for progress.   Patient has completed 22 sessions losing 1 lb. He did not attend the first nutrition class but says he plans to attend the next class. He says he is  trying to eat smaller portions since he started. His wife does the cooking and shopping and is not on board with healthy eating. Patient says he does feel better since he started. He is going to the [facility]  2 days/week. He still feels like his medications need to be adjusted for him to feel at his optimal best. He continues to do well in the program. Will continue to monitor for progress.   Patient has completed 31 sessions losing 1 lb. His reported fasting glucose levels continue to be elevated averaging 150. He has no hgb A1C on file. He blood pressure also remains elevated. Cardiologist contacted 12/16/16 and he added Amlodipine 5 mg daily. Patient says he does not follow his diet as he should. He has been eating more pork lately but plans to stop. He has done well exercising and continues to progress. He says he has more energy and has felt better in the past few weeks than he has since his procedure. He is exercising at the [facility] on the days he does not come to [place]. Will continue to monitor for progress.    Patient graduated with 36 sessions maintaining his weight. He says he continues to try and follow his diabetic diet and eat smaller portions. He is trying to cut out pork from his diet. His blood pressue was better controlled at the end of the program after his cardiologist made some adjustments. His fasting glucose readings he reported were controlled most of the time. He says the program helped him meet his personal goals. He does have more energy and feels better overall since he started the program. HIs exit walk test improved by 8.33% and his exit measurments improved in grip strength and balance. 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 He plans to continue exercising at the [facility]   Expected Outcomes  -  Patient will complete the program meeting her personal goals.   Patient will complete the program and continue to meet his personal goals.   Patient will complete the program and continue to  gain strength and energy and continue to work toward adhering to his diabetic and heart healthy diet.   Patient will continue to exercise at the [facility] and continue to gain strength and energy and maintain his weight. [facility] will f/u for 1 year.       Exercise Goals and Review: Exercise Goals    Row Name 10/01/16 1505             Exercise Goals   Increase Physical Activity  Yes       Intervention  Provide advice, education, support and counseling about physical  activity/exercise needs.;Develop an individualized exercise prescription for aerobic and resistive training based on initial evaluation findings, risk stratification, comorbidities and participant's personal goals.       Expected Outcomes  Achievement of increased cardiorespiratory fitness and enhanced flexibility, muscular endurance and strength shown through measurements of functional capacity and personal statement of participant.       Increase Strength and Stamina  Yes       Intervention  Provide advice, education, support and counseling about physical activity/exercise needs.;Develop an individualized exercise prescription for aerobic and resistive training based on initial evaluation findings, risk stratification, comorbidities and participant's personal goals.       Expected Outcomes  Achievement of increased cardiorespiratory fitness and enhanced flexibility, muscular endurance and strength shown through measurements of functional capacity and personal statement of participant.          Nutrition & Weight - Outcomes: Pre Biometrics - 10/01/16 1333      Pre Biometrics   Height  '5\' 7"'  (1.702 m)    Weight  177 lb 11.1 oz (80.6 kg)    Waist Circumference  40 inches    Hip Circumference  37 inches    Waist to Hip Ratio  1.08 %    BMI (Calculated)  27.9    Triceps Skinfold  7 mm    % Body Fat  25 %    Grip Strength  80 kg    Flexibility  0 in    Single Leg Stand  2 seconds      Post Biometrics - 01/03/17 0738       Post   Biometrics   Height  '5\' 7"'  (1.702 m)    Weight  176 lb 8 oz (80.1 kg)    Waist Circumference  37 inches    Hip Circumference  36.5 inches    Waist to Hip Ratio  1.01 %    BMI (Calculated)  27.64    Triceps Skinfold  3 mm    % Body Fat  20.2 %    Grip Strength  87.2 kg    Flexibility  0 in    Single Leg Stand  12 seconds       Nutrition: Nutrition Therapy & Goals - 11/29/16 0811      Personal Nutrition Goals   Nutrition Goal  For heart healthy choices add >50% of whole grains, make half their plate fruits and vegetables. Discuss the difference between starchy vegetables and leafy greens, and how leafy vegetables provide fiber, helps maintain healthy weight, helps control blood glucose, and lowers cholesterol.  Discuss purchasing fresh or frozen vegetable to reduce sodium and not to add grease, fat or sugar. Consume <18oz of red meat per week. Consume lean cuts of meats and very little of meats high in sodium and nitrates such as pork and lunch meats. Discussed portion control for all food groups      Intervention Plan   Intervention  Nutrition handout(s) given to patient.    Expected Outcomes  Short Term Goal: Understand basic principles of dietary content, such as calories, fat, sodium, cholesterol and nutrients.       Nutrition Discharge: Nutrition Assessments - 01/03/17 1616      MEDFICTS Scores   Pre Score  50    Post Score  58    Score Difference  8       Education Questionnaire Score: Knowledge Questionnaire Score - 01/03/17 1616      Knowledge Questionnaire Score   Pre Score  18/24  Post Score  20/24       Goals reviewed with patient; copy given to patient.

## 2017-01-03 NOTE — Progress Notes (Signed)
Cardiac Individual Treatment Plan  Patient Details  Name: Marcus Norton MRN: [id number] Date of Birth: 1940-07-11 Referring Provider:     CARDIAC REHAB PHASE II ORIENTATION from 10/01/2016 in [place]  Referring Provider  [name]      Initial Encounter Date:    CARDIAC REHAB PHASE II ORIENTATION from 10/01/2016 in [facility]  Date  10/01/16  Referring Provider  [name]      Visit Diagnosis: Status post coronary artery stent placement  Patient's Home Medications on Admission:  Current Outpatient Medications:  .  acetaminophen (TYLENOL) 325 MG tablet, Take 325 mg by mouth 2 (two) times daily as needed for mild pain., Disp: , Rfl:  .  alfuzosin (UROXATRAL) 10 MG 24 hr tablet, Take 10 mg by mouth daily with breakfast., Disp: , Rfl:  .  amLODipine (NORVASC) 5 MG tablet, Take 1 tablet (5 mg total) by mouth daily., Disp: 90 tablet, Rfl: 3 .  aspirin EC 81 MG tablet, Take 81 mg by mouth at bedtime. , Disp: , Rfl:  .  atorvastatin (LIPITOR) 10 MG tablet, Take 5 mg by mouth every other day. , Disp: , Rfl:  .  Cyanocobalamin (VITAMIN B 12 PO), Take 2,000 Units by mouth daily. , Disp: , Rfl:  .  diazepam (VALIUM) 5 MG tablet, TAKE 1 TABLET BY MOUTH AT BEDTIME AS NEEDED FOR SLEEP, Disp: , Rfl: 5 .  docusate sodium (COLACE) 100 MG capsule, Take 100 mg by mouth daily. , Disp: , Rfl:  .  LANTUS SOLOSTAR 100 UNIT/ML Solostar Pen, Inject 18 Units into the skin daily at 10 pm., Disp: 15 mL, Rfl: 11 .  lisinopril (PRINIVIL,ZESTRIL) 20 MG tablet, Take 1 tablet (20 mg total) by mouth daily., Disp: 90 tablet, Rfl: 3 .  loratadine (CLARITIN) 10 MG tablet, Take 10 mg by mouth daily as needed for allergies. , Disp: , Rfl:  .  Magnesium 200 MG TABS, Take 1 tablet (200 mg total) by mouth daily., Disp: 30 each, Rfl: 6 .  metFORMIN (GLUCOPHAGE) 1000 MG tablet, Take 1 tablet (1,000 mg total) by mouth 2 (two) times daily with a meal., Disp: 180 tablet,  Rfl: 3 .  metoprolol tartrate (LOPRESSOR) 25 MG tablet, Take 1 tablet (25 mg total) by mouth 2 (two) times daily., Disp: 180 tablet, Rfl: 3 .  nitroGLYCERIN (NITROSTAT) 0.4 MG SL tablet, Place 1 tablet (0.4 mg total) under the tongue every 5 (five) minutes x 3 doses as needed. (Patient taking differently: Place 0.4 mg under the tongue every 5 (five) minutes x 3 doses as needed for chest pain. ), Disp: 30 tablet, Rfl: 3 .  pantoprazole (PROTONIX) 40 MG tablet, Take 1 tablet (40 mg total) by mouth daily., Disp: 90 tablet, Rfl: 5 .  polyethylene glycol (MIRALAX / GLYCOLAX) packet, Take 17 g by mouth daily as needed for moderate constipation. , Disp: , Rfl:  .  prasugrel (EFFIENT) 10 MG TABS tablet, Take 1 tablet (10 mg total) by mouth daily., Disp: 90 tablet, Rfl: 3 .  sitaGLIPtin (JANUVIA) 100 MG tablet, Take 1 tablet (100 mg total) by mouth daily., Disp: 90 tablet, Rfl: 3  Past Medical History: Past Medical History:  Diagnosis Date  . 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 Arthritis    "lower back" (07/29/2016)  . ASCVD (arteriosclerotic cardiovascular disease)    -luminal irregularities in 1998 and 2005; normal EF  . Benign prostatic hypertrophy    status post transurethral resection of the  prostate  . CAD (coronary artery disease)    a. Cath 07/29/16 99% ostial RCA & 70% pRCA s/p cutting balloon angioplasty and single SYNERGY DES 2.5X28. 100% very Distal 1st RPLB lesion --> the occlusion site moved further distal with guidewire advancement;  40% ostial LAD; 50% Ost Cx to Prox Cx lesion.  . Coronary artery disease   . Dysphagia   . Erectile dysfunction   . GERD (gastroesophageal reflux disease)   . Heart murmur   . History of hiatal hernia   . History of kidney stones   . Hyperlipidemia   . Hypertension   . Seasonal allergies   . Tobacco abuse    20 pack years; discontinued 1995  . Type II diabetes mellitus (HCC)     Tobacco Use: Social History   Tobacco Use  Smoking Status Former  Smoker  . Packs/day: 1.00  . Years: 15.00  . Pack years: 15.00  . Types: Cigarettes  . Start date: 03/08/1965  . Last attempt to quit: 07/07/1993  . Years since quitting: 23.5  Smokeless Tobacco Never Used    Labs: Recent Chemical engineer    Labs for ITP Cardiac and Pulmonary Rehab Latest Ref Rng & Units 12/31/2015 03/04/2016 04/16/2016 07/15/2016 11/15/2016   Cholestrol 0 - 200 mg/dL - 141 - - -   LDLCALC 0 - 99 mg/dL - 86 - - -   HDL >39.00 mg/dL - 42.80 - - -   Trlycerides 0.0 - 149.0 mg/dL - 63.0 - - -   Hemoglobin A1c - 7.6 - 7.5 6.7 7.3      Capillary Blood Glucose: Lab Results  Component Value Date   GLUCAP 232 (H) 07/30/2016   GLUCAP 206 (H) 07/29/2016   GLUCAP 143 (H) 07/29/2016   GLUCAP 169 (H) 07/29/2016   GLUCAP 130 (H) 06/30/2016     Exercise Target Goals:    Exercise Program Goal: Individual exercise prescription set with THRR, safety & activity barriers. Participant demonstrates ability to understand and report RPE using BORG scale, to self-measure pulse accurately, and to acknowledge the importance of the exercise prescription.  Exercise Prescription Goal: Starting with aerobic activity 30 plus minutes a day, 3 days per week for initial exercise prescription. Provide home exercise prescription and guidelines that participant acknowledges understanding prior to discharge.  Activity Barriers & Risk Stratification: Activity Barriers & Cardiac Risk Stratification - 10/01/16 1504      Activity Barriers & Cardiac Risk Stratification   Activity Barriers  None    Cardiac Risk Stratification  Moderate       6 Minute Walk: 6 Minute Walk    Row Name 10/01/16 1331 01/03/17 0737       6 Minute Walk   Phase  Initial  Discharge    Distance  1200 feet  1300 feet    Distance % Change  0 %  8.33 %    Distance Feet Change  -  100 ft    Walk Time  6 minutes  6 minutes    # of Rest Breaks  0  0    MPH  2.27  2.46    METS  2.74  2.88    RPE  9  13     Perceived Dyspnea   9  9    VO2 Peak  8.48  8.64    Symptoms  No  No    Resting HR  78 bpm  78 bpm    Resting BP  128/50  130/50  Resting Oxygen Saturation   -  98 %    Exercise Oxygen Saturation  during 6 min walk  -  98 %    Max Ex. HR  95 bpm  90 bpm    Max Ex. BP  152/62  140/82    2 Minute Post BP  128/50  130/50       Oxygen Initial Assessment:   Oxygen Re-Evaluation:   Oxygen Discharge (Final Oxygen Re-Evaluation):   Initial Exercise Prescription: Initial Exercise Prescription - 10/01/16 1300      Date of Initial Exercise RX and Referring Provider   Date  10/01/16    Referring Provider  [name]      Treadmill   MPH  1.9    Grade  0    Minutes  15    METs  2.4      NuStep   Level  2    SPM  94    Minutes  20    METs  2.2      Prescription Details   Frequency (times per week)  3    Duration  Progress to 30 minutes of continuous aerobic without signs/symptoms of physical distress      Intensity   THRR 40-80% of Max Heartrate  104-117-130    Ratings of Perceived Exertion  11-13    Perceived Dyspnea  0-4      Progression   Progression  Continue progressive overload as per policy without signs/symptoms or physical distress.      Resistance Training   Training Prescription  Yes    Weight  1    Reps  10-15       Perform Capillary Blood Glucose checks as needed.  Exercise Prescription Changes:  Exercise Prescription Changes    Row Name 10/05/16 1000 10/21/16 0800 11/22/16 1400 12/06/16 1400 12/17/16 1400     Response to Exercise   Blood Pressure (Admit)  130/56  132/50  136/60  150/56  150/50   Blood Pressure (Exercise)  152/74  150/60  180/70  147/80  182/60   Blood Pressure (Exit)  112/60  146/50  158/50  134/76  150/50   Heart Rate (Admit)  97 bpm  70 bpm  75 bpm  75 bpm  77 bpm   Heart Rate (Exercise)  109 bpm  101 bpm  96 bpm  106 bpm  100 bpm   Heart Rate (Exit)  96 bpm  79 bpm  84 bpm  81 bpm  86 bpm   Rating of Perceived  Exertion (Exercise)  '13  11  9  10  10   ' Duration  Progress to 30 minutes of  aerobic without signs/symptoms of physical distress  Progress to 30 minutes of  aerobic without signs/symptoms of physical distress  Progress to 30 minutes of  aerobic without signs/symptoms of physical distress  Progress to 30 minutes of  aerobic without signs/symptoms of physical distress  Progress to 30 minutes of  aerobic without signs/symptoms of physical distress   Intensity  THRR unchanged  THRR unchanged  THRR New 102-116-129  THRR unchanged 102-116-129  THRR unchanged 103-117-130     Progression   Progression  Continue to progress workloads to maintain intensity without signs/symptoms of physical distress.  Continue to progress workloads to maintain intensity without signs/symptoms of physical distress.  Continue to progress workloads to maintain intensity without signs/symptoms of physical distress.  Continue to progress workloads to maintain intensity without signs/symptoms of physical distress.  Continue to  progress workloads to maintain intensity without signs/symptoms of physical distress.     Resistance Training   Training Prescription  Yes  Yes  Yes  Yes  Yes   Weight  0  '2  2  2  4   ' Reps  10-15  10-15  10-15  10-15  10-15     Treadmill   MPH  1.9  2.2  2.3  2.2  2.2   Grade  0  0  0.5  0.5  0.5   Minutes  '15  15  15  15  15   ' METs  2.4  2.68  2.91  2.8  2.8     NuStep   Level  '2  2  3  3  3   ' SPM  81  92  89  95  97   Minutes  '20  20  20  20  20   ' METs  3.55  2.2  2.1  2.1  2.8     Home Exercise Plan   Plans to continue exercise at  Home (comment)  Home (comment)  Home (comment)  Home (comment)  Home (comment)   Frequency  Add 2 additional days to program exercise sessions.  Add 2 additional days to program exercise sessions.  Add 2 additional days to program exercise sessions.  Add 2 additional days to program exercise sessions.  Add 2 additional days to program exercise sessions.       Exercise Comments:  Exercise Comments    Row Name 10/05/16 1018 10/21/16 0803 11/22/16 1422 12/06/16 1427 12/17/16 1438   Exercise Comments  Patient has just started CR and will be progressed in time.   Patient is doing well in CR.   Patient is doing well in CR. He has progressed on both treadmill and nustep. He has been given a grade on teh treadmill and is working that very well.   Patient is doing well in CR. He was decreased on Treadmill due to higher BP. He has increased his SPM on the Nustep  Patient is doing well in PR. He has progressed on both the Nustep and the arm ergometer. He states that he feels that this program is helping him.      Exercise Goals and Review:  Exercise Goals    Row Name 10/01/16 1505             Exercise Goals   Increase Physical Activity  Yes       Intervention  Provide advice, education, support and counseling about physical activity/exercise needs.;Develop an individualized exercise prescription for aerobic and resistive training based on initial evaluation findings, risk stratification, comorbidities and participant's personal goals.       Expected Outcomes  Achievement of increased cardiorespiratory fitness and enhanced flexibility, muscular endurance and strength shown through measurements of functional capacity and personal statement of participant.       Increase Strength and Stamina  Yes       Intervention  Provide advice, education, support and counseling about physical activity/exercise needs.;Develop an individualized exercise prescription for aerobic and resistive training based on initial evaluation findings, risk stratification, comorbidities and participant's personal goals.       Expected Outcomes  Achievement of increased cardiorespiratory fitness and enhanced flexibility, muscular endurance and strength shown through measurements of functional capacity and personal statement of participant.          Exercise Goals Re-Evaluation  : Exercise Goals Re-Evaluation    Row Name 10/27/16 1407  11/22/16 1421 12/17/16 1436         Exercise Goal Re-Evaluation   Exercise Goals Review  Increase Physical Activity;Increase Strength and Stamina  Increase Physical Activity;Increase Strength and Stamina;Knowledge and understanding of Target Heart Rate Range (THRR)  Increase Physical Activity;Increase Strength and Stamina;Knowledge and understanding of Target Heart Rate Range (THRR)     Comments  Patient mets are increasing. He is progressing. Will continue to monitor for progress.   Patient is doing well in CR. He has progressed on both treadmill and nustep. He has been given a grade on teh treadmill and is working that very well.   Patient is doing well in CR. He is maintaining his levels and is keepin ghis SPMs going well. He states that thi sprogram is helping him      Expected Outcomes  Patient will complete the program with continued increased strength, stamina, and activity.  Patient wants to feel better and to have more energy.   Patient wishes to feel better overall and wants to have more energy          Discharge Exercise Prescription (Final Exercise Prescription Changes): Exercise Prescription Changes - 12/17/16 1400      Response to Exercise   Blood Pressure (Admit)  150/50    Blood Pressure (Exercise)  182/60    Blood Pressure (Exit)  150/50    Heart Rate (Admit)  77 bpm    Heart Rate (Exercise)  100 bpm    Heart Rate (Exit)  86 bpm    Rating of Perceived Exertion (Exercise)  10    Duration  Progress to 30 minutes of  aerobic without signs/symptoms of physical distress    Intensity  THRR unchanged 103-117-130   103-117-130     Progression   Progression  Continue to progress workloads to maintain intensity without signs/symptoms of physical distress.      Resistance Training   Training Prescription  Yes    Weight  4    Reps  10-15      Treadmill   MPH  2.2    Grade  0.5    Minutes  15    METs  2.8       NuStep   Level  3    SPM  97    Minutes  20    METs  2.8      Home Exercise Plan   Plans to continue exercise at  Home (comment)    Frequency  Add 2 additional days to program exercise sessions.       Nutrition:  Target Goals: Understanding of nutrition guidelines, daily intake of sodium <1561m, cholesterol <2021m calories 30% from fat and 7% or less from saturated fats, daily to have 5 or more servings of fruits and vegetables.  Biometrics: Pre Biometrics - 10/01/16 1333      Pre Biometrics   Height  '5\' 7"'  (1.702 m)    Weight  177 lb 11.1 oz (80.6 kg)    Waist Circumference  40 inches    Hip Circumference  37 inches    Waist to Hip Ratio  1.08 %    BMI (Calculated)  27.9    Triceps Skinfold  7 mm    % Body Fat  25 %    Grip Strength  80 kg    Flexibility  0 in    Single Leg Stand  2 seconds      Post Biometrics - 01/03/17 074097  Post  Biometrics   Height  '5\' 7"'  (1.702 m)    Weight  176 lb 8 oz (80.1 kg)    Waist Circumference  37 inches    Hip Circumference  36.5 inches    Waist to Hip Ratio  1.01 %    BMI (Calculated)  27.64    Triceps Skinfold  3 mm    % Body Fat  20.2 %    Grip Strength  87.2 kg    Flexibility  0 in    Single Leg Stand  12 seconds       Nutrition Therapy Plan and Nutrition Goals: Nutrition Therapy & Goals - 11/29/16 0811      Personal Nutrition Goals   Nutrition Goal  For heart healthy choices add >50% of whole grains, make half their plate fruits and vegetables. Discuss the difference between starchy vegetables and leafy greens, and how leafy vegetables provide fiber, helps maintain healthy weight, helps control blood glucose, and lowers cholesterol.  Discuss purchasing fresh or frozen vegetable to reduce sodium and not to add grease, fat or sugar. Consume <18oz of red meat per week. Consume lean cuts of meats and very little of meats high in sodium and nitrates such as pork and lunch meats. Discussed portion control for all food groups       Intervention Plan   Intervention  Nutrition handout(s) given to patient.    Expected Outcomes  Short Term Goal: Understand basic principles of dietary content, such as calories, fat, sodium, cholesterol and nutrients.       Nutrition Discharge: Rate Your Plate Scores: Nutrition Assessments - 01/03/17 1616      MEDFICTS Scores   Pre Score  50    Post Score  58    Score Difference  8       Nutrition Goals Re-Evaluation: Nutrition Goals Re-Evaluation    Row Name 12/17/16 1459             Goals   Current Weight  176 lb 8 oz (80.1 kg)       Nutrition Goal  For heart healthy choices add >50% of whole grains, make half their plate fruits and vegetables. Discuss the difference between starchy vegetables and leafy greens, and how leafy vegetables provide fiber, helps maintain healthy weight, helps control blood glucose, and lowers cholesterol.  Discuss purchasing fresh or frozen vegetable to reduce sodium and not to add grease, fat or sugar. Consume <18oz of red meat per week. Consume lean cuts of meats and very little of meats high in sodium and nitrates such as pork and lunch meats. Discussed portion control for all food groups.         Comment  Patient continues to work toward meeting his nutrition goals. He is trying to eat smaller portions and stop eating pork and decrease his sugary foods. Will continue to monitor.        Expected Outcome  Patient will continue to work toward meeting his nutrition goals.           Nutrition Goals Discharge (Final Nutrition Goals Re-Evaluation): Nutrition Goals Re-Evaluation - 12/17/16 1459      Goals   Current Weight  176 lb 8 oz (80.1 kg)    Nutrition Goal  For heart healthy choices add >50% of whole grains, make half their plate fruits and vegetables. Discuss the difference between starchy vegetables and leafy greens, and how leafy vegetables provide fiber, helps maintain healthy weight, helps control blood glucose, and lowers  cholesterol.   Discuss purchasing fresh or frozen vegetable to reduce sodium and not to add grease, fat or sugar. Consume <18oz of red meat per week. Consume lean cuts of meats and very little of meats high in sodium and nitrates such as pork and lunch meats. Discussed portion control for all food groups.      Comment  Patient continues to work toward meeting his nutrition goals. He is trying to eat smaller portions and stop eating pork and decrease his sugary foods. Will continue to monitor.     Expected Outcome  Patient will continue to work toward meeting his nutrition goals.        Psychosocial: Target Goals: Acknowledge presence or absence of significant depression and/or stress, maximize coping skills, provide positive support system. Participant is able to verbalize types and ability to use techniques and skills needed for reducing stress and depression.  Initial Review & Psychosocial Screening: Initial Psych Review & Screening - 10/01/16 1516      Initial Review   Current issues with  None Identified      Family Dynamics   Good Support System?  Yes      Barriers   Psychosocial barriers to participate in program  There are no identifiable barriers or psychosocial needs.      Screening Interventions   Interventions  Encouraged to exercise       Quality of Life Scores: Quality of Life - 01/03/17 0740      Quality of Life Scores   Health/Function Pre  19.04 %    Health/Function Post  25.23 %    Health/Function % Change  32.51 %    Socioeconomic Pre  28.93 %    Socioeconomic Post  27.14 %    Socioeconomic % Change   -6.19 %    Psych/Spiritual Pre  24.86 %    Psych/Spiritual Post  30 %    Psych/Spiritual % Change  20.68 %    Family Pre  28.8 %    Family Post  27.6 %    Family % Change  -4.17 %    GLOBAL Pre  23.85 %    GLOBAL Post  27.37 %    GLOBAL % Change  14.76 %       PHQ-9: Recent Review Flowsheet Data    Depression screen [facility] 2/9 01/03/2017 10/01/2016 09/07/2016 08/17/2016  03/04/2016   Decreased Interest 0 0 0 0 0   Down, Depressed, Hopeless 0 0 0 0 0   PHQ - 2 Score 0 0 0 0 0   Altered sleeping '3 2 1 1 ' -   Tired, decreased energy '1 3 1 1 ' -   Change in appetite 0 0 0 0 -   Feeling bad or failure about yourself  0 0 0 0 -   Trouble concentrating 0 0 0 0 -   Moving slowly or fidgety/restless 0 0 0 0 -   Suicidal thoughts 0 0 0 0 -   PHQ-9 Score '4 5 2 2 ' -   Difficult doing work/chores Not difficult at all Somewhat difficult - - -     Interpretation of Total Score  Total Score Depression Severity:  1-4 = Minimal depression, 5-9 = Mild depression, 10-14 = Moderate depression, 15-19 = Moderately severe depression, 20-27 = Severe depression   Psychosocial Evaluation and Intervention: Psychosocial Evaluation - 01/03/17 1625      Discharge Psychosocial Assessment & Intervention   Comments  Patient has no psychosocial barriers identified at discharge.  His exit QOL score improved by 14.76% at 27.37%. His PHQ-9 score also improved by 1. His exit score was 4.       Psychosocial Re-Evaluation: Psychosocial Re-Evaluation    Row Name 10/27/16 1408 11/24/16 1311 12/17/16 1502         Psychosocial Re-Evaluation   Current issues with  None Identified  None Identified  None Identified     Expected Outcomes  Patient will have no psychosocial issues identified at discharge.   There will be no psychosocial issues identified at discharge.   Patient will have no psychosocial issues identified at discharge.      Interventions  Encouraged to attend Cardiac Rehabilitation for the exercise  Encouraged to attend Cardiac Rehabilitation for the exercise  Stress management education;Relaxation education;Encouraged to attend Cardiac Rehabilitation for the exercise     Continue Psychosocial Services   No Follow up required  No Follow up required  No Follow up required        Psychosocial Discharge (Final Psychosocial Re-Evaluation): Psychosocial Re-Evaluation - 12/17/16 1502       Psychosocial Re-Evaluation   Current issues with  None Identified    Expected Outcomes  Patient will have no psychosocial issues identified at discharge.     Interventions  Stress management education;Relaxation education;Encouraged to attend Cardiac Rehabilitation for the exercise    Continue Psychosocial Services   No Follow up required       Vocational Rehabilitation: Provide vocational rehab assistance to qualifying candidates.   Vocational Rehab Evaluation & Intervention: Vocational Rehab - 10/01/16 1503      Initial Vocational Rehab Evaluation & Intervention   Assessment shows need for Vocational Rehabilitation  No       Education: Education Goals: Education classes will be provided on a weekly basis, covering required topics. Participant will state understanding/return demonstration of topics presented.  Learning Barriers/Preferences: Learning Barriers/Preferences - 10/01/16 1502      Learning Barriers/Preferences   Learning Barriers  None    Learning Preferences  Written Material;Verbal Instruction;Group Instruction;Audio       Education Topics: Hypertension, Hypertension Reduction -Define heart disease and high blood pressure. Discus how high blood pressure affects the body and ways to reduce high blood pressure.   CARDIAC REHAB PHASE II EXERCISE from 12/29/2016 in [place]  Date  12/29/16  Educator  DC  Instruction Review Code  2- Demonstrated Understanding      Exercise and Your Heart -Discuss why it is important to exercise, the FITT principles of exercise, normal and abnormal responses to exercise, and how to exercise safely.   Angina -Discuss definition of angina, causes of angina, treatment of angina, and how to decrease risk of having angina.   CARDIAC REHAB PHASE II EXERCISE from 12/29/2016 in [place]  Date  10/06/16  Educator  DC      Cardiac Medications -Review what the following cardiac  medications are used for, how they affect the body, and side effects that may occur when taking the medications.  Medications include Aspirin, Beta blockers, calcium channel blockers, ACE Inhibitors, angiotensin receptor blockers, diuretics, digoxin, and antihyperlipidemics.   CARDIAC REHAB PHASE II EXERCISE from 12/29/2016 in [place]  Date  10/13/16  Educator  Etheleen Mayhew  Instruction Review Code  2- meets goals/outcomes      Congestive Heart Failure -Discuss the definition of CHF, how to live with CHF, the signs and symptoms of CHF, and how keep track of weight and sodium intake.  CARDIAC REHAB PHASE II EXERCISE from 12/29/2016 in [place]  Date  10/20/16  Educator  DC  Instruction Review Code  2- Demonstrated Understanding      Heart Disease and Intimacy -Discus the effect sexual activity has on the heart, how changes occur during intimacy as we age, and safety during sexual activity.   CARDIAC REHAB PHASE II EXERCISE from 12/29/2016 in [place]  Date  10/27/16  Educator  DC  Instruction Review Code  2- Demonstrated Understanding      Smoking Cessation / COPD -Discuss different methods to quit smoking, the health benefits of quitting smoking, and the definition of COPD.   CARDIAC REHAB PHASE II EXERCISE from 12/29/2016 in [place]  Date  11/03/16  Educator  DC  Instruction Review Code  2- Demonstrated Understanding      Nutrition I: Fats -Discuss the types of cholesterol, what cholesterol does to the heart, and how cholesterol levels can be controlled.   CARDIAC REHAB PHASE II EXERCISE from 12/29/2016 in [place]  Date  11/10/16  Educator  DC  Instruction Review Code  2- Demonstrated Understanding      Nutrition II: Labels -Discuss the different components of food labels and how to read food label   [place] from 12/29/2016 in  [place]  Date  11/17/16  Educator  DC  Instruction Review Code  2- Demonstrated Understanding      Heart Parts and Heart Disease -Discuss the anatomy of the heart, the pathway of blood circulation through the heart, and these are affected by heart disease.   CARDIAC REHAB PHASE II EXERCISE from 12/29/2016 in [place]  Date  11/24/16  Educator  DC  Instruction Review Code  2- Demonstrated Understanding      Stress I: Signs and Symptoms -Discuss the causes of stress, how stress may lead to anxiety and depression, and ways to limit stress.   CARDIAC REHAB PHASE II EXERCISE from 12/29/2016 in [place]  Date  12/01/16  Educator  DC  Instruction Review Code  2- Demonstrated Understanding      Stress II: Relaxation -Discuss different types of relaxation techniques to limit stress.   CARDIAC REHAB PHASE II EXERCISE from 12/29/2016 in [place]  Date  12/08/16  Educator  DC  Instruction Review Code  2- Demonstrated Understanding      Warning Signs of Stroke / TIA -Discuss definition of a stroke, what the signs and symptoms are of a stroke, and how to identify when someone is having stroke.   CARDIAC REHAB PHASE II EXERCISE from 12/29/2016 in [place]  Date  12/15/16  Educator  Dc  Instruction Review Code  2- Demonstrated Understanding      Knowledge Questionnaire Score: Knowledge Questionnaire Score - 01/03/17 1616      Knowledge Questionnaire Score   Pre Score  18/24    Post Score  20/24       Core Components/Risk Factors/Patient Goals at Admission: Personal Goals and Risk Factors at Admission - 10/01/16 1507      Core Components/Risk Factors/Patient Goals on Admission    Weight Management  Weight Maintenance    Personal Goal Other  Yes    Personal Goal  Get stronger, feel better overall    Intervention  Attend program 3 x week and supplement with  2 x week at home.  Expected Outcomes  Achieve personal goals       Core Components/Risk Factors/Patient Goals Review:  Goals and Risk Factor Review    Row Name 10/01/16 1516 10/27/16 1357 11/24/16 1306 12/17/16 1451 01/03/17 1619     Core Components/Risk Factors/Patient Goals Review   Personal Goals Review  Other Get stronger  - Get stronger. Have more energy.  Weight Management/Obesity Feel better; have more energy.   - Get Stronger; have more energy.  Weight Management/Obesity;Diabetes;Hypertension Feel better and have more energy.   Review  -  Patient has completed 10 sessions gaining 1 lb. He is doing well in the program with some progression. He feels the program is benefiting him. Will continue to monitor for progress.   Patient has completed 22 sessions losing 1 lb. He did not attend the first nutrition class but says he plans to attend the next class. He says he is trying to eat smaller portions since he started. His wife does the cooking and shopping and is not on board with healthy eating. Patient says he does feel better since he started. He is going to the [facility]  2 days/week. He still feels like his medications need to be adjusted for him to feel at his optimal best. He continues to do well in the program. Will continue to monitor for progress.   Patient has completed 31 sessions losing 1 lb. His reported fasting glucose levels continue to be elevated averaging 150. He has no hgb A1C on file. He blood pressure also remains elevated. Cardiologist contacted 12/16/16 and he added Amlodipine 5 mg daily. Patient says he does not follow his diet as he should. He has been eating more pork lately but plans to stop. He has done well exercising and continues to progress. He says he has more energy and has felt better in the past few weeks than he has since his procedure. He is exercising at the [facility] on the days he does not come to [facility]. Will continue to monitor for progress.    Patient graduated with 36  sessions maintaining his weight. He says he continues to try and follow his diabetic diet and eat smaller portions. He is trying to cut out pork from his diet. His blood pressue was better controlled at the end of the program after his cardiologist made some adjustments. His fasting glucose readings he reported were controlled most of the time. He says the program helped him meet his personal goals. He does have more energy and feels better overall since he started the program. HIs exit walk test improved by 8.33% and his exit measurments improved in grip strength and balance. 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 He plans to continue exercising at the [facility].   Expected Outcomes  -  Patient will complete the program meeting her personal goals.   Patient will complete the program and continue to meet his personal goals.   Patient will complete the program and continue to gain strength and energy and continue to work toward adhering to his diabetic and heart healthy diet.   Patient will continue to exercise at the [facility] and continue to gain strength and energy and maintain his weight. CR will f/u for 1 year.       Core Components/Risk Factors/Patient Goals at Discharge (Final Review):  Goals and Risk Factor Review - 01/03/17 1619      Core Components/Risk Factors/Patient Goals Review   Personal Goals Review  Weight Management/Obesity;Diabetes;Hypertension Feel  better and have more energy.   Feel better and have more energy.   Review  Patient graduated with 36 sessions maintaining his weight. He says he continues to try and follow his diabetic diet and eat smaller portions. He is trying to cut out pork from his diet. His blood pressue was better controlled at the end of the program after his cardiologist made some adjustments. His fasting glucose readings he reported were controlled most of the time. He says the program helped him meet his personal goals. He does have more energy and feels  better overall since he started the program. HIs exit walk test improved by 8.33% and his exit measurments improved in grip strength and balance. 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 He plans to continue exercising at the [facility].    Expected Outcomes  Patient will continue to exercise at the [facility] and continue to gain strength and energy and maintain his weight. CR will f/u for 1 year.        ITP Comments:   Comments: Patient graduated from [place] today on 12/29/2016 after completing 36 sessions. He achieved LTG of 30 minutes of aerobic exercise at Max Met level of 2.82. All patients vitals are WNL. Patient has met with dietician. Discharge instruction has been reviewed in detail and patient stated an understanding of material given. Patient plans to continue exercising at the [facility]. Cardiac Rehab staff will make f/u calls at 1 month, 6 months, and 1 year. Patient had no complaints of any abnormal S/S or pain on their exit visit.

## 2017-02-01 ENCOUNTER — Ambulatory Visit: Payer: Medicare Other | Admitting: Cardiovascular Disease

## 2017-02-04 ENCOUNTER — Encounter: Payer: Self-pay | Admitting: Cardiovascular Disease

## 2017-02-04 ENCOUNTER — Ambulatory Visit: Payer: Medicare Other | Admitting: Cardiovascular Disease

## 2017-02-04 VITALS — BP 120/64 | HR 74 | Ht 67.0 in | Wt 178.0 lb

## 2017-02-04 DIAGNOSIS — E785 Hyperlipidemia, unspecified: Secondary | ICD-10-CM

## 2017-02-04 DIAGNOSIS — R002 Palpitations: Secondary | ICD-10-CM | POA: Diagnosis not present

## 2017-02-04 DIAGNOSIS — I10 Essential (primary) hypertension: Secondary | ICD-10-CM

## 2017-02-04 DIAGNOSIS — I739 Peripheral vascular disease, unspecified: Secondary | ICD-10-CM

## 2017-02-04 DIAGNOSIS — I25118 Atherosclerotic heart disease of native coronary artery with other forms of angina pectoris: Secondary | ICD-10-CM

## 2017-02-04 DIAGNOSIS — Z955 Presence of coronary angioplasty implant and graft: Secondary | ICD-10-CM

## 2017-02-04 NOTE — Patient Instructions (Signed)
Your physician wants you to follow-up in: 6 months with Dr.Koneswaran You will receive a reminder letter in the mail two months in advance. If you don't receive a letter, please call our office to schedule the follow-up appointment.   Your physician recommends that you continue on your current medications as directed. Please refer to the Current Medication list given to you today.    If you need a refill on your cardiac medications before your next appointment, please call your pharmacy.     No lab work or tests ordered today.     Thank you for choosing [facility] !         

## 2017-02-04 NOTE — Progress Notes (Signed)
SUBJECTIVE: The patient presents for follow-up of coronary artery disease. He underwent drug-eluting stent placement to the RCA on 08/02/16. He had a residual ostial LAD 40% stenosis and 50% stenosis of the ostial to proximal left circumflex coronary artery.  I discontinued carvedilol at his last visit due to complaints of lethargy  I also increased the dose of lisinopril.  He tells me he tried stopping caffeine about 4 or 5 days ago as he has been having episodic palpitations associated with lightheadedness.  He denies any significant chest pain and shortness of breath.  He reduced his metoprolol to three quarters of a tablet twice a day.  He says he now has more energy than he has had in the past 3 years since stent placement.  Review of Systems: As per "subjective", otherwise negative.  Allergies  Allergen Reactions  . Plavix [Clopidogrel Bisulfate] Shortness Of Breath  . Oxycontin [Oxycodone Hcl] Other (See Comments)    Malaise  . Penicillins Other (See Comments)    [place] Has patient had a PCN reaction causing immediate rash, facial/tongue/throat swelling, SOB or lightheadedness with hypotension: No Has patient had a PCN reaction causing severe rash involving mucus membranes or skin necrosis: No Has patient had a PCN reaction that required hospitalization No Has patient had a PCN reaction occurring within the last 10 years: No If all of the above answers are "NO", then may proceed with Cephalosporin use.   . Pioglitazone Other (See Comments)    Made his chest hurt  . Prednisone Other (See Comments)    Reaction: muscle aches and soreness  . Simvastatin Other (See Comments)    Myalgias    Current Outpatient Medications  Medication Sig Dispense Refill  . acetaminophen (TYLENOL) 325 MG tablet Take 325 mg by mouth 2 (two) times daily as needed for mild pain.    [name] alfuzosin (UROXATRAL) 10 MG 24 hr tablet Take 10 mg by mouth daily with breakfast.    . amLODipine  (NORVASC) 5 MG tablet Take 1 tablet (5 mg total) by mouth daily. 90 tablet 3  . aspirin EC 81 MG tablet Take 81 mg by mouth at bedtime.     [name] atorvastatin (LIPITOR) 10 MG tablet Take 5 mg by mouth every other day.     . Cyanocobalamin (VITAMIN B 12 PO) Take 2,000 Units by mouth daily.     . diazepam (VALIUM) 5 MG tablet TAKE 1 TABLET BY MOUTH AT BEDTIME AS NEEDED FOR SLEEP  5  . docusate sodium (COLACE) 100 MG capsule Take 100 mg by mouth daily.     [name] LANTUS SOLOSTAR 100 UNIT/ML Solostar Pen Inject 18 Units into the skin daily at 10 pm. 15 mL 11  . loratadine (CLARITIN) 10 MG tablet Take 10 mg by mouth daily as needed for allergies.     . Magnesium 200 MG TABS Take 1 tablet (200 mg total) by mouth daily. 30 each 6  . metFORMIN (GLUCOPHAGE) 1000 MG tablet Take 1 tablet (1,000 mg total) by mouth 2 (two) times daily with a meal. 180 tablet 3  . metoprolol tartrate (LOPRESSOR) 25 MG tablet Take 1 tablet (25 mg total) by mouth 2 (two) times daily. 180 tablet 3  . nitroGLYCERIN (NITROSTAT) 0.4 MG SL tablet Place 1 tablet (0.4 mg total) under the tongue every 5 (five) minutes x 3 doses as needed. (Patient taking differently: Place 0.4 mg under the tongue every 5 (five) minutes x 3 doses as needed for  chest pain. ) 30 tablet 3  . pantoprazole (PROTONIX) 40 MG tablet Take 1 tablet (40 mg total) by mouth daily. 90 tablet 5  . polyethylene glycol (MIRALAX / GLYCOLAX) packet Take 17 g by mouth daily as needed for moderate constipation.     . prasugrel (EFFIENT) 10 MG TABS tablet Take 1 tablet (10 mg total) by mouth daily. 90 tablet 3  . sitaGLIPtin (JANUVIA) 100 MG tablet Take 1 tablet (100 mg total) by mouth daily. 90 tablet 3  . lisinopril (PRINIVIL,ZESTRIL) 20 MG tablet Take 1 tablet (20 mg total) by mouth daily. 90 tablet 3   No current facility-administered medications for this visit.     Past Medical History:  Diagnosis Date  . 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 Arthritis    "lower back"  (07/29/2016)  . ASCVD (arteriosclerotic cardiovascular disease)    -luminal irregularities in 1998 and 2005; normal EF  . Benign prostatic hypertrophy    status post transurethral resection of the prostate  . CAD (coronary artery disease)    a. Cath 07/29/16 99% ostial RCA & 70% pRCA s/p cutting balloon angioplasty and single SYNERGY DES 2.5X28. 100% very Distal 1st RPLB lesion --> the occlusion site moved further distal with guidewire advancement;  40% ostial LAD; 50% Ost Cx to Prox Cx lesion.  . Coronary artery disease   . Dysphagia   . Erectile dysfunction   . GERD (gastroesophageal reflux disease)   . Heart murmur   . History of hiatal hernia   . History of kidney stones   . Hyperlipidemia   . Hypertension   . Seasonal allergies   . Tobacco abuse    20 pack years; discontinued 1995  . Type II diabetes mellitus ([place])     Past Surgical History:  Procedure Laterality Date  . APPENDECTOMY    . CATARACT EXTRACTION W/ INTRAOCULAR LENS  IMPLANT, BILATERAL Bilateral   . COLONOSCOPY  07/20/2011   Dr. Gala Romney: multiple hyperplastic polyps. Surveillance 2018  . COLONOSCOPY N/A 06/30/2016   Procedure: COLONOSCOPY;  Surgeon: Daneil Dolin, MD;  Location: [facility];  Service: Endoscopy;  Laterality: N/A;  10:00am  . COLONOSCOPY W/ BIOPSIES AND POLYPECTOMY  07/08/08, 06/2011   Dr Madolyn Frieze papilla, diminutive rectal polyp ablated the, left-sided diverticula, piecemeal polypectomy of hepatic flexure adenomatous polyp, diminutive polyps near hepatic flexure ablated  . CORONARY ANGIOPLASTY WITH STENT PLACEMENT  07/29/2016  . CORONARY STENT INTERVENTION N/A 07/29/2016   Procedure: Coronary Stent Intervention;  Surgeon: Leonie Man, MD;  Location: [place] [facility];  Service: Cardiovascular;  Laterality: N/A;  RCA  . ESOPHAGOGASTRODUODENOSCOPY  2013   erosive reflux esophagitis, s/p empiric dilation. chronic duodenitis.   [name] LEFT HEART CATH AND CORONARY ANGIOGRAPHY N/A 07/29/2016   Procedure:  Left Heart Cath and Coronary Angiography;  Surgeon: Leonie Man, MD;  Location: [place] [facility];  Service: Cardiovascular;  Laterality: N/A;  . NASAL SEPTUM SURGERY    . NASAL SINUS SURGERY     "cleaned out my sinus"  . PERIPHERAL VASCULAR CATHETERIZATION N/A 11/26/2015   Procedure: Abdominal Aortogram w/Lower Extremity;  Surgeon: Wellington Hampshire, MD;  Location: [place] [facility];  Service: Cardiovascular;  Laterality: N/A;  . TONSILLECTOMY    . TRANSURETHRAL RESECTION OF PROSTATE  2009    Social History   Socioeconomic History  . Marital status: Married    Spouse name: Not on file  . Number of children: 2  . Years of education: Not on  file  . Highest education level: Not on file  Social Needs  . Financial resource strain: Not on file  . Food insecurity - worry: Not on file  . Food insecurity - inability: Not on file  . Transportation needs - medical: Not on file  . Transportation needs - non-medical: Not on file  Occupational History  . Occupation: Dietitian, 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    Comment: [employer]  Tobacco Use  . Smoking status: Former Smoker    Packs/day: 1.00    Years: 15.00    Pack years: 15.00    Types: Cigarettes    Start date: 03/08/1965    Last attempt to quit: 07/07/1993    Years since quitting: 23.5  . Smokeless tobacco: Never Used  Substance and Sexual Activity  . Alcohol use: Yes    Alcohol/week: 0.0 oz    Comment: 07/29/2016 "last drink was 4 wks ago; had a beer"  . Drug use: No  . Sexual activity: Not Currently  Other Topics Concern  . Not on file  Social History Narrative  . Not on file     Vitals:   02/04/17 1009  BP: 120/64  Pulse: 74  SpO2: 90%  Weight: 178 lb (80.7 kg)  Height: 5\' 7"  (1.702 m)    Wt Readings from Last 3 Encounters:  02/04/17 178 lb (80.7 kg)  01/03/17 176 lb 8 oz (80.1 kg)  11/15/16 177 lb (80.3 kg)     PHYSICAL EXAM General: NAD HEENT: Normal. Neck: No JVD, no  thyromegaly. Lungs: Clear to auscultation bilaterally with normal respiratory effort. CV: Regular rate and rhythm, normal S1/S2, no S3/S4, no murmur. No pretibial or periankle edema. Abdomen: Soft, nontender, no distention.  Neurologic: Alert and oriented.  Psych: Normal affect. Skin: Normal. Musculoskeletal: No gross deformities.    ECG: Most recent ECG reviewed.   Labs: Lab Results  Component Value Date/Time   K 4.6 10/01/2016 02:57 PM   BUN 26 (H) 10/01/2016 02:57 PM   BUN 26 (A) 12/15/2015   CREATININE 1.09 10/01/2016 02:57 PM   CREATININE 0.91 11/18/2015 11:53 AM   ALT 35 09/07/2016 10:55 AM   TSH 1.85 03/04/2016 10:29 AM   HGB 14.1 10/01/2016 02:57 PM     Lipids: Lab Results  Component Value Date/Time   LDLCALC 86 03/04/2016 10:29 AM   LDLCALC 91 03/12/2008   CHOL 141 03/04/2016 10:29 AM   TRIG 63.0 03/04/2016 10:29 AM   TRIG 96 03/12/2008   HDL 42.80 03/04/2016 10:29 AM       ASSESSMENT AND PLAN:   1. Coronary artery disease status post RCA stent in June 2018: Symptomatically stable.  Did not tolerate carvedilol.  Continue dual antiplatelet therapy with aspirin and Effient for 1 year. Continue Lipitor.  I encouraged him to take metoprolol 25 mg twice daily.  2. Hypertension: Controlled.  No changes to therapy.  3. Hyperlipidemia: Continue statin therapy. He prefers to take 5 mg every other day. This is managed by his PCP.  4. Peripheral vascular disease: Continues to walk without difficulty. Continue ASA and Lipitor. He underwent bilateral common iliac artery stent placement in 2017. Followswith Dr. Fletcher Anon.  5.  Palpitations associated with lightheadedness: I encouraged him to take metoprolol 25 mg twice daily rather than taking three quarters of the pill twice daily.   Disposition: Follow up 6 months   Kate Sable, M.D., F.A.C.C.

## 2017-02-23 ENCOUNTER — Ambulatory Visit: Payer: Medicare Other | Admitting: Internal Medicine

## 2017-03-08 ENCOUNTER — Telehealth: Payer: Self-pay | Admitting: General Practice

## 2017-03-08 ENCOUNTER — Ambulatory Visit (INDEPENDENT_AMBULATORY_CARE_PROVIDER_SITE_OTHER): Payer: Medicare Other

## 2017-03-08 ENCOUNTER — Ambulatory Visit (INDEPENDENT_AMBULATORY_CARE_PROVIDER_SITE_OTHER): Payer: Medicare Other | Admitting: Physician Assistant

## 2017-03-08 ENCOUNTER — Encounter: Payer: Self-pay | Admitting: Physician Assistant

## 2017-03-08 ENCOUNTER — Other Ambulatory Visit: Payer: Self-pay

## 2017-03-08 VITALS — BP 121/72 | HR 78 | Temp 98.1°F | Resp 16 | Ht 67.0 in | Wt 178.2 lb

## 2017-03-08 DIAGNOSIS — Z23 Encounter for immunization: Secondary | ICD-10-CM | POA: Diagnosis not present

## 2017-03-08 DIAGNOSIS — R319 Hematuria, unspecified: Secondary | ICD-10-CM | POA: Diagnosis not present

## 2017-03-08 DIAGNOSIS — R1013 Epigastric pain: Secondary | ICD-10-CM

## 2017-03-08 DIAGNOSIS — E1165 Type 2 diabetes mellitus with hyperglycemia: Secondary | ICD-10-CM | POA: Diagnosis not present

## 2017-03-08 DIAGNOSIS — E1159 Type 2 diabetes mellitus with other circulatory complications: Secondary | ICD-10-CM | POA: Diagnosis not present

## 2017-03-08 DIAGNOSIS — Z0001 Encounter for general adult medical examination with abnormal findings: Secondary | ICD-10-CM | POA: Diagnosis not present

## 2017-03-08 DIAGNOSIS — Z0000 Encounter for general adult medical examination without abnormal findings: Secondary | ICD-10-CM

## 2017-03-08 DIAGNOSIS — I10 Essential (primary) hypertension: Secondary | ICD-10-CM | POA: Diagnosis not present

## 2017-03-08 LAB — CBC WITH DIFFERENTIAL/PLATELET
BASOS ABS: 0.1 10*3/uL (ref 0.0–0.1)
Basophils Relative: 0.7 % (ref 0.0–3.0)
EOS ABS: 0.2 10*3/uL (ref 0.0–0.7)
Eosinophils Relative: 3 % (ref 0.0–5.0)
HCT: 42.3 % (ref 39.0–52.0)
Hemoglobin: 14 g/dL (ref 13.0–17.0)
LYMPHS ABS: 1.3 10*3/uL (ref 0.7–4.0)
Lymphocytes Relative: 18.5 % (ref 12.0–46.0)
MCHC: 33 g/dL (ref 30.0–36.0)
MCV: 101.1 fl — ABNORMAL HIGH (ref 78.0–100.0)
Monocytes Absolute: 0.4 10*3/uL (ref 0.1–1.0)
Monocytes Relative: 6 % (ref 3.0–12.0)
NEUTROS ABS: 5.1 10*3/uL (ref 1.4–7.7)
NEUTROS PCT: 71.8 % (ref 43.0–77.0)
PLATELETS: 246 10*3/uL (ref 150.0–400.0)
RBC: 4.19 Mil/uL — AB (ref 4.22–5.81)
RDW: 13.4 % (ref 11.5–15.5)
WBC: 7.1 10*3/uL (ref 4.0–10.5)

## 2017-03-08 LAB — MICROALBUMIN / CREATININE URINE RATIO
Creatinine,U: 116.4 mg/dL
Microalb Creat Ratio: 1.1 mg/g (ref 0.0–30.0)
Microalb, Ur: 1.2 mg/dL (ref 0.0–1.9)

## 2017-03-08 LAB — LIPID PANEL
CHOL/HDL RATIO: 4
Cholesterol: 135 mg/dL (ref 0–200)
HDL: 37.5 mg/dL — AB (ref >39.00)
LDL Cholesterol: 83 mg/dL (ref 0–99)
NONHDL: 97.81
Triglycerides: 76 mg/dL (ref 0.0–149.0)
VLDL: 15.2 mg/dL (ref 0.0–40.0)

## 2017-03-08 LAB — HEMOGLOBIN A1C: HEMOGLOBIN A1C: 8 % — AB (ref 4.6–6.5)

## 2017-03-08 LAB — COMPREHENSIVE METABOLIC PANEL
ALK PHOS: 44 U/L (ref 39–117)
ALT: 27 U/L (ref 0–53)
AST: 16 U/L (ref 0–37)
Albumin: 4.5 g/dL (ref 3.5–5.2)
BILIRUBIN TOTAL: 0.6 mg/dL (ref 0.2–1.2)
BUN: 22 mg/dL (ref 6–23)
CO2: 32 meq/L (ref 19–32)
CREATININE: 0.77 mg/dL (ref 0.40–1.50)
Calcium: 9.6 mg/dL (ref 8.4–10.5)
Chloride: 99 mEq/L (ref 96–112)
GFR: 103.85 mL/min (ref >60.00)
GLUCOSE: 223 mg/dL — AB (ref 70–99)
Potassium: 5 mEq/L (ref 3.5–5.1)
SODIUM: 137 meq/L (ref 135–145)
TOTAL PROTEIN: 6.9 g/dL (ref 6.0–8.3)

## 2017-03-08 LAB — POCT URINALYSIS DIPSTICK
BILIRUBIN UA: NEGATIVE
Glucose, UA: 250
KETONES UA: NEGATIVE
Leukocytes, UA: NEGATIVE
NITRITE UA: NEGATIVE
PROTEIN UA: NEGATIVE
RBC UA: NEGATIVE
Spec Grav, UA: 1.02 (ref 1.010–1.025)
UROBILINOGEN UA: 2 U/dL — AB
pH, UA: 6.5 (ref 5.0–8.0)

## 2017-03-08 LAB — PSA: PSA: 1.08 ng/mL (ref 0.10–4.00)

## 2017-03-08 MED ORDER — DEXLANSOPRAZOLE 60 MG PO CPDR: 60.0000 mg | DELAYED_RELEASE_CAPSULE | Freq: Every day | ORAL | 3 refills | Status: DC

## 2017-03-08 MED ORDER — TADALAFIL 20 MG PO TABS: 10.0000 mg | ORAL_TABLET | ORAL | 1 refills | Status: DC | PRN

## 2017-03-08 NOTE — Assessment & Plan Note (Signed)
Concern for stone giving history and other symptoms. Urine dip today with glucose. No blood noted. Will check KUB as patient wanting to avoid CT if possible. Increase fluids. If stone present we will need to schedule follow-up with his urologist.

## 2017-03-08 NOTE — Progress Notes (Signed)
Subjective:   Marcus Norton is a 77 y.o. male who presents for Medicare Annual/Subsequent preventive examination.  Review of Systems:  No ROS.  Medicare Wellness Visit. Additional risk factors are reflected in the social history.  Cardiac Risk Factors include: advanced age (>53men, >94 women);diabetes mellitus;dyslipidemia;hypertension;male gender   Sleep patterns: Wakes up often (every 2 hours).  Home Safety/Smoke Alarms: Feels safe in home. Smoke alarms in place.  Living environment; residence and Firearm Safety: Lives with wife in 2 story home.  Seat Belt Safety/Bike Helmet: Wears seat belt.    Male:   CCS-Colonoscopy 06/30/2016, normal. No recall.      PSA-  Lab Results  Component Value Date   PSA 1.2 12/15/2015       Objective:    Vitals: BP 121/72   Pulse 78   Temp 98.1 F (36.7 C) (Oral)   Resp 16   Ht 5\' 7"  (1.702 m)   Wt 178 lb 4 oz (80.9 kg)   SpO2 98%   BMI 27.92 kg/m   Body mass index is 27.92 kg/m.  Advanced Directives 03/08/2017 10/01/2016 07/29/2016 07/29/2016 06/30/2016 04/30/2016 12/08/2015  Does Patient Have a Medical Advance Directive? No No Yes No No No No  Type of Advance Directive - - [place]  Does patient want to make changes to medical advance directive? - - No - Patient declined - - - -  Copy of [place] in Chart? - - Yes - - - -  Would patient like information on creating a medical advance directive? Yes (MAU/Ambulatory/Procedural Areas - Information given) No - Patient declined No - Patient declined Yes (MAU/Ambulatory/Procedural Areas - Information given) No - Patient declined - No - patient declined information  Pre-existing out of facility DNR order (yellow form or pink MOST form) - - - - - - -    Tobacco Social History   Tobacco Use  Smoking Status Former Smoker  . Packs/day: 1.00  . Years: 15.00  . Pack years: 15.00  . Types: Cigarettes  . Start date: 03/08/1965  . Last attempt to  quit: 07/07/1993  . Years since quitting: 23.6  Smokeless Tobacco Never Used     Counseling given: Yes   Past Medical History:  Diagnosis Date  . 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 Arthritis    "lower back" (07/29/2016)  . ASCVD (arteriosclerotic cardiovascular disease)    -luminal irregularities in 1998 and 2005; normal EF  . Benign prostatic hypertrophy    status post transurethral resection of the prostate  . CAD (coronary artery disease)    a. Cath 07/29/16 99% ostial RCA & 70% pRCA s/p cutting balloon angioplasty and single SYNERGY DES 2.5X28. 100% very Distal 1st RPLB lesion --> the occlusion site moved further distal with guidewire advancement;  40% ostial LAD; 50% Ost Cx to Prox Cx lesion.  . Coronary artery disease   . Dysphagia   . Erectile dysfunction   . GERD (gastroesophageal reflux disease)   . Heart murmur   . History of hiatal hernia   . History of kidney stones   . Hyperlipidemia   . Hypertension   . Seasonal allergies   . Tobacco abuse    20 pack years; discontinued 1995  . Type II diabetes mellitus ([place])    Past Surgical History:  Procedure Laterality Date  . APPENDECTOMY    . CATARACT EXTRACTION W/ INTRAOCULAR LENS  IMPLANT, BILATERAL Bilateral   . COLONOSCOPY  07/20/2011   Dr. Gala Romney: multiple hyperplastic polyps. Surveillance 2018  . COLONOSCOPY N/A 06/30/2016   Procedure: COLONOSCOPY;  Surgeon: Daneil Dolin, MD;  [facility];  Service: Endoscopy;  Laterality: N/A;  10:00am  . COLONOSCOPY W/ BIOPSIES AND POLYPECTOMY  07/08/08, 06/2011   Dr Madolyn Frieze papilla, diminutive rectal polyp ablated the, left-sided diverticula, piecemeal polypectomy of hepatic flexure adenomatous polyp, diminutive polyps near hepatic flexure ablated  . CORONARY ANGIOPLASTY WITH STENT PLACEMENT  07/29/2016  . CORONARY STENT INTERVENTION N/A 07/29/2016   Procedure: Coronary Stent Intervention;  Surgeon: Leonie Man, MD;  Location: [place] [facility];  Service:  Cardiovascular;  Laterality: N/A;  RCA  . ESOPHAGOGASTRODUODENOSCOPY  2013   erosive reflux esophagitis, s/p empiric dilation. chronic duodenitis.   [name] LEFT HEART CATH AND CORONARY ANGIOGRAPHY N/A 07/29/2016   Procedure: Left Heart Cath and Coronary Angiography;  Surgeon: Leonie Man, MD;  Location: [facility];  Service: Cardiovascular;  Laterality: N/A;  . NASAL SEPTUM SURGERY    . NASAL SINUS SURGERY     "cleaned out my sinus"  . PERIPHERAL VASCULAR CATHETERIZATION N/A 11/26/2015   Procedure: Abdominal Aortogram w/Lower Extremity;  Surgeon: Wellington Hampshire, MD;  Location: [facility];  Service: Cardiovascular;  Laterality: N/A;  . TONSILLECTOMY    . TRANSURETHRAL RESECTION OF PROSTATE  2009   Family History  Problem Relation Age of Onset  . Ovarian cancer Mother   . Colon cancer Sister 76       deceased from colon cancer   Social History   Socioeconomic History  . Marital status: Married    Spouse name: None  . Number of children: 2  . Years of education: None  . Highest education level: None  Social Needs  . Financial resource strain: None  . Food insecurity - worry: None  . Food insecurity - inability: None  . Transportation needs - medical: None  . Transportation needs - non-medical: None  Occupational History  . Occupation: Dietitian, 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    Comment: [employer]  Tobacco Use  . Smoking status: Former Smoker    Packs/day: 1.00    Years: 15.00    Pack years: 15.00    Types: Cigarettes    Start date: 03/08/1965    Last attempt to quit: 07/07/1993    Years since quitting: 23.6  . Smokeless tobacco: Never Used  Substance and Sexual Activity  . Alcohol use: Yes    Alcohol/week: 0.0 oz    Comment: 07/29/2016 "last drink was 4 wks ago; had a beer"  . Drug use: No  . Sexual activity: Not Currently  Other Topics Concern  . None  Social History Narrative  . None    Outpatient Encounter Medications as of  03/08/2017  Medication Sig  . acetaminophen (TYLENOL) 325 MG tablet Take 325 mg by mouth 2 (two) times daily as needed for mild pain.  [name] alfuzosin (UROXATRAL) 10 MG 24 hr tablet Take 10 mg by mouth daily with breakfast.  . amLODipine (NORVASC) 5 MG tablet Take 1 tablet (5 mg total) by mouth daily.  [name] aspirin EC 81 MG tablet Take 81 mg by mouth at bedtime.   [name] atorvastatin (LIPITOR) 10 MG tablet Take 5 mg by mouth every other day.   . Cyanocobalamin (VITAMIN B 12 PO) Take 2,000 Units by mouth daily.   . diazepam (VALIUM) 5 MG tablet TAKE 1 TABLET BY MOUTH AT BEDTIME AS NEEDED FOR SLEEP  .  docusate sodium (COLACE) 100 MG capsule Take 100 mg by mouth daily.   [name] LANTUS SOLOSTAR 100 UNIT/ML Solostar Pen Inject 18 Units into the skin daily at 10 pm.  . loratadine (CLARITIN) 10 MG tablet Take 10 mg by mouth daily as needed for allergies.   . Magnesium 200 MG TABS Take 1 tablet (200 mg total) by mouth daily.  . metFORMIN (GLUCOPHAGE) 1000 MG tablet Take 1 tablet (1,000 mg total) by mouth 2 (two) times daily with a meal.  . pantoprazole (PROTONIX) 40 MG tablet Take 1 tablet (40 mg total) by mouth daily.  . polyethylene glycol (MIRALAX / GLYCOLAX) packet Take 17 g by mouth daily as needed for moderate constipation.   . prasugrel (EFFIENT) 10 MG TABS tablet Take 1 tablet (10 mg total) by mouth daily.  . sitaGLIPtin (JANUVIA) 100 MG tablet Take 1 tablet (100 mg total) by mouth daily.  [name] dexlansoprazole (DEXILANT) 60 MG capsule Take 1 capsule (60 mg total) by mouth daily.  [name] lisinopril (PRINIVIL,ZESTRIL) 20 MG tablet Take 1 tablet (20 mg total) by mouth daily.  . metoprolol tartrate (LOPRESSOR) 25 MG tablet Take 1 tablet (25 mg total) by mouth 2 (two) times daily.  . tadalafil (CIALIS) 20 MG tablet Take 0.5-1 tablets (10-20 mg total) by mouth every other day as needed for erectile dysfunction.  . [DISCONTINUED] nitroGLYCERIN (NITROSTAT) 0.4 MG SL tablet Place 1 tablet (0.4 mg total) under the tongue every 5  (five) minutes x 3 doses as needed. (Patient not taking: Reported on 03/08/2017)   No facility-administered encounter medications on file as of 03/08/2017.     Activities of Daily Living In your present state of health, do you have any difficulty performing the following activities: 03/08/2017 03/08/2017  Hearing? N -  Vision? N -  Difficulty concentrating or making decisions? N -  Walking or climbing stairs? N -  Dressing or bathing? N -  Doing errands, shopping? N -  Preparing Food and eating ? N N  Using the Toilet? N N  In the past six months, have you accidently leaked urine? N Y  Do you have problems with loss of bowel control? N N  Managing your Medications? N N  Managing your Finances? N N  Housekeeping or managing your Housekeeping? N N  Some recent data might be hidden    Patient Care Team: Delorse Limber as PCP - General (Family Medicine) Orion Crook, MD (Inactive) (Urology) Gala Romney Cristopher Estimable, MD (Gastroenterology) Herminio Commons, MD as Attending Physician (Cardiology) Philemon Kingdom, MD as Consulting Physician (Internal Medicine) Allyn Kenner, MD (Dermatology) [facility], [place], MD as Attending Physician   Assessment:   This is a routine wellness examination for Jerame.  Exercise Activities and Dietary recommendations Current Exercise Habits: Structured exercise class(Owns farm), Type of exercise: treadmill;strength training/weights(elliptical), Time (Minutes): 40, Frequency (Times/Week): 3, Weekly Exercise (Minutes/Week): 120, Intensity: Mild, Exercise limited by: None identified  Diet (meal preparation, eat out, water intake, caffeinated beverages, dairy products, fruits and vegetables): Drinks water and diet caffeine free sodas.   Eats heart healthy most meals.   Goals    . Patient Stated     Stay active.        Fall Risk Fall Risk  03/08/2017 03/08/2017 10/01/2016 03/04/2016 02/24/2016  Falls in the past year? No No No No No  Risk  for fall due to : - - (No Data) - -  Risk for fall due to: Comment - - No history  of falls - -   Depression Screen PHQ 2/9 Scores 03/08/2017 01/03/2017 10/01/2016 09/07/2016  PHQ - 2 Score 1 0 0 0  PHQ- 9 Score 1 4 5 2   Exception Documentation - - (No Data) -    Cognitive Function MMSE - Mini Mental State Exam 03/08/2017  Orientation to time 5  Orientation to Place 5  Registration 3  Attention/ Calculation 5  Recall 1  Language- name 2 objects 2  Language- repeat 1  Language- follow 3 step command 3  Language- read & follow direction 1  Write a sentence 1  Copy design 1  Total score 28        Immunization History  Administered Date(s) Administered  . Influenza, High Dose Seasonal PF 11/03/2015  . Influenza-Unspecified 11/20/2016  . Pneumococcal Conjugate-13 03/04/2016  . Td 03/11/2011  . Zoster Recombinat (Shingrix) 05/01/2016, 10/05/2016    Screening Tests Health Maintenance  Topic Date Due  . FOOT EXAM  03/08/1950  . URINE MICROALBUMIN  03/08/1950  . DTaP/Tdap/Td (1 - Tdap) 03/12/2011  . PNA vac Low Risk Adult (2 of 2 - PPSV23) 03/04/2017  . HEMOGLOBIN A1C  05/15/2017  . OPHTHALMOLOGY EXAM  05/27/2017  . TETANUS/TDAP  03/10/2021  . INFLUENZA VACCINE  Completed      Plan:     Continue doing brain stimulating activities (puzzles, reading, adult coloring books, staying active) to keep memory sharp.   Bring a copy of your living will and/or healthcare power of attorney to your next office visit.  I have personally reviewed and noted the following in the patient's chart:   . Medical and social history . Use of alcohol, tobacco or illicit drugs  . Current medications and supplements . Functional ability and status . Nutritional status . Physical activity . Advanced directives . List of other physicians . Hospitalizations, surgeries, and ER visits in previous 12 months . Vitals . Screenings to include cognitive, depression, and falls . Referrals and  appointments  In addition, I have reviewed and discussed with patient certain preventive protocols, quality metrics, and best practice recommendations. A written personalized care plan for preventive services as well as general preventive health recommendations were provided to patient.     Gerilyn Nestle, RN  03/08/2017

## 2017-03-08 NOTE — Telephone Encounter (Signed)
Noted  

## 2017-03-08 NOTE — Telephone Encounter (Signed)
We already discussed at visit.I will be taking over this for him per his wishes.

## 2017-03-08 NOTE — Assessment & Plan Note (Signed)
Stop Protonix. Will attempt trial of Dexilant. Diet reviewed. Follow-up with GI if not improving.

## 2017-03-08 NOTE — Assessment & Plan Note (Signed)
Eye exam up-to-date. Foot exam updated today. No concerning findings. Recent A1C at 7.6. Will check BMP and microalbumin today. Patient wishing to have DM managed by primary care as he feels more comfortable here. Will take over management per his wishes.

## 2017-03-08 NOTE — Telephone Encounter (Signed)
Spoke to patient while he was in the lab. He wanted to know if his A1c looked good if [facility] P[facility]asant would consider treating his diabetes?   Pt has an upcoming appt with Dr. Cruzita Lederer and would like to know before that appt. I advised patient that he may not get a response until [facility] had received the labs back. Pt stated an understanding.

## 2017-03-08 NOTE — Assessment & Plan Note (Signed)
Pneumovax given by RN today.

## 2017-03-08 NOTE — Assessment & Plan Note (Signed)
Asymptomatic. BP stable today. Continue current regimen. Follow-up with Cardiology as scheduled.

## 2017-03-08 NOTE — Patient Instructions (Addendum)
Please go to the lab for blood work.   Our office will call you with your results unless you have chosen to receive results via MyChart.  If your blood work is normal we will follow-up each year for physicals and as scheduled for chronic medical problems.  If anything is abnormal we will treat accordingly and get you in for a follow-up.  Please go to the [facility] office for x-ray to assess for stone.  I want you to stop the Protonix and start the Dexilant daily. Stop the Claritin and start a daily Xyzal. I have printed a script for the Cialis to take as needed. You cannot take this anymore if you resume nitroglycerin.  Please follow-up with specialists as scheduled. I will be taking over Diabetes management at your request.   Continue doing brain stimulating activities (puzzles, reading, adult coloring books, staying active) to keep memory sharp.   Bring a copy of your living will and/or healthcare power of attorney to your next office visit.  Preventive Care 100 Years and Older, Male Preventive care refers to lifestyle choices and visits with your health care provider that can promote health and wellness. What does preventive care include?  A yearly physical exam. This is also called an annual well check.  Dental exams once or twice a year.  Routine eye exams. Ask your health care provider how often you should have your eyes checked.  Personal lifestyle choices, including: ? Daily care of your teeth and gums. ? Regular physical activity. ? Eating a healthy diet. ? Avoiding tobacco and drug use. ? Limiting alcohol use. ? Practicing safe sex. ? Taking low doses of aspirin every day. ? Taking vitamin and mineral supplements as recommended by your health care provider. What happens during an annual well check? The services and screenings done by your health care provider during your annual well check will depend on your age, overall health, lifestyle risk factors, and  family history of disease. Counseling Your health care provider may ask you questions about your:  Alcohol use.  Tobacco use.  Drug use.  Emotional well-being.  Home and relationship well-being.  Sexual activity.  Eating habits.  History of falls.  Memory and ability to understand (cognition).  Work and work Statistician.  Screening You may have the following tests or measurements:  Height, weight, and BMI.  Blood pressure.  Lipid and cholesterol levels. These may be checked every 5 years, or more frequently if you are over 14 years old.  Skin check.  Lung cancer screening. You may have this screening every year starting at age 30 if you have a 30-pack-year history of smoking and currently smoke or have quit within the past 15 years.  Fecal occult blood test (FOBT) of the stool. You may have this test every year starting at age 93.  Flexible sigmoidoscopy or colonoscopy. You may have a sigmoidoscopy every 5 years or a colonoscopy every 10 years starting at age 11.  Prostate cancer screening. Recommendations will vary depending on your family history and other risks.  Hepatitis C blood test.  Hepatitis B blood test.  Sexually transmitted disease (STD) testing.  Diabetes screening. This is done by checking your blood sugar (glucose) after you have not eaten for a while (fasting). You may have this done every 1-3 years.  Abdominal aortic aneurysm (AAA) screening. You may need this if you are a current or former smoker.  Osteoporosis. You may be screened starting at age 18 if you  are at high risk.  Talk with your health care provider about your test results, treatment options, and if necessary, the need for more tests. Vaccines Your health care provider may recommend certain vaccines, such as:  Influenza vaccine. This is recommended every year.  Tetanus, diphtheria, and acellular pertussis (Tdap, Td) vaccine. You may need a Td booster every 10  years.  Varicella vaccine. You may need this if you have not been vaccinated.  Zoster vaccine. You may need this after age 19.  Measles, mumps, and rubella (MMR) vaccine. You may need at least one dose of MMR if you were born in 1957 or later. You may also need a second dose.  Pneumococcal 13-valent conjugate (PCV13) vaccine. One dose is recommended after age 90.  Pneumococcal polysaccharide (PPSV23) vaccine. One dose is recommended after age 52.  Meningococcal vaccine. You may need this if you have certain conditions.  Hepatitis A vaccine. You may need this if you have certain conditions or if you travel or work in places where you may be exposed to hepatitis A.  Hepatitis B vaccine. You may need this if you have certain conditions or if you travel or work in places where you may be exposed to hepatitis B.  Haemophilus influenzae type b (Hib) vaccine. You may need this if you have certain risk factors.  Talk to your health care provider about which screenings and vaccines you need and how often you need them. This information is not intended to replace advice given to you by your health care provider. Make sure you discuss any questions you have with your health care provider. Document Released: 03/07/2015 Document Revised: 10/29/2015 Document Reviewed: 12/10/2014 Elsevier Interactive Patient Education  [employer].

## 2017-03-08 NOTE — Progress Notes (Signed)
Patient presents to clinic today for annual exam.  Patient is fasting for labs. Patient is seeing RN next for [place] portion.  Acute Concerns: Patient endorses intermittent low back pain and left testicular pain over the past 2 weeks. Noted a couple of episodes of hematuria last week. Has history of stone and is concerned of recurrence. Was also concerned for infection so took a few tablets of leftover Doxycycline that he was given previously. Denies urinary frequency, urgency, dysuria or hesitancy. Is s/p TURP for BPH, currently on Alfuzosin daily. Denies nocturia.   Patient also endorses dry cough over the past few weeks associated with intermittent nasal drainage and increased reflux despite no change in diet and consistent use of Protonix. Denies epigastric pain, nausea or vomiting.  Chronic Issues: Diabetes Mellitus -- Followed by Endocrinology. Is currently on a regimen of Metformin 1000 mg BID and Januvia 100 mg daily. Regular eye examination -- every 6 months presently. Overdue for foot examination. Denies concerns today. Does note occasional tingling in shins. Denies numbness or tingling in feet or hands. Occasional cramping in hands. Endorses fasting sugars averaging 130. Endorses well-balanced diet. No recent changes. Is currently getting on the treadmill about 20 minutes per day.   Lab Results  Component Value Date   HGBA1C 8.0 (H) 03/08/2017   Hypertension -- Currently on a regimen of Lisinopril, Metoprolol and Amlodipine. Endorses taking medications as directed. Home BP running around 130/80s. Patient denies chest pain, lightheadedness, dizziness, vision changes or frequent headaches.   BP Readings from Last 3 Encounters:  03/08/17 121/72  02/04/17 120/64  11/15/16 128/62   Hyperlipidemia -- followed by Cardiology. Currently taking Atorvastatin 5 mg QOD. Is fasting for labs today.   CAD -- Followed by Cardiology. Is on Effient in addition to antihypertensives and statin  therapy. Is taking as directed. Recently had follow-up in December.   Health Maintenance: Immunizations -- Will get Pneumovax today. Colonoscopy -- up-to-date  Past Medical History:  Diagnosis Date  . 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 Arthritis    "lower back" (07/29/2016)  . ASCVD (arteriosclerotic cardiovascular disease)    -luminal irregularities in 1998 and 2005; normal EF  . Benign prostatic hypertrophy    status post transurethral resection of the prostate  . CAD (coronary artery disease)    a. Cath 07/29/16 99% ostial RCA & 70% pRCA s/p cutting balloon angioplasty and single SYNERGY DES 2.5X28. 100% very Distal 1st RPLB lesion --> the occlusion site moved further distal with guidewire advancement;  40% ostial LAD; 50% Ost Cx to Prox Cx lesion.  . Coronary artery disease   . Dysphagia   . Erectile dysfunction   . GERD (gastroesophageal reflux disease)   . Heart murmur   . History of hiatal hernia   . History of kidney stones   . Hyperlipidemia   . Hypertension   . Seasonal allergies   . Tobacco abuse    20 pack years; discontinued 1995  . Type II diabetes mellitus ([place])     Past Surgical History:  Procedure Laterality Date  . APPENDECTOMY    . CATARACT EXTRACTION W/ INTRAOCULAR LENS  IMPLANT, BILATERAL Bilateral   . COLONOSCOPY  07/20/2011   Dr. Gala Romney: multiple hyperplastic polyps. Surveillance 2018  . COLONOSCOPY N/A 06/30/2016   Procedure: COLONOSCOPY;  Surgeon: Daneil Dolin, MD;  Location: [facility];  Service: Endoscopy;  Laterality: N/A;  10:00am  . COLONOSCOPY W/ BIOPSIES AND POLYPECTOMY  07/08/08, 06/2011   Dr Madolyn Frieze papilla,  diminutive rectal polyp ablated the, left-sided diverticula, piecemeal polypectomy of hepatic flexure adenomatous polyp, diminutive polyps near hepatic flexure ablated  . CORONARY ANGIOPLASTY WITH STENT PLACEMENT  07/29/2016  . CORONARY STENT INTERVENTION N/A 07/29/2016   Procedure: Coronary Stent Intervention;  Surgeon: Leonie Man, MD;  Location: [facility] [place] [facility];  Service: Cardiovascular;  Laterality: N/A;  RCA  . ESOPHAGOGASTRODUODENOSCOPY  2013   erosive reflux esophagitis, s/p empiric dilation. chronic duodenitis.   [name] LEFT HEART CATH AND CORONARY ANGIOGRAPHY N/A 07/29/2016   Procedure: Left Heart Cath and Coronary Angiography;  Surgeon: Leonie Man, MD;  Location: [facility];  Service: Cardiovascular;  Laterality: N/A;  . NASAL SEPTUM SURGERY    . NASAL SINUS SURGERY     "cleaned out my sinus"  . PERIPHERAL VASCULAR CATHETERIZATION N/A 11/26/2015   Procedure: Abdominal Aortogram w/Lower Extremity;  Surgeon: Wellington Hampshire, MD;  Location: [facility];  Service: Cardiovascular;  Laterality: N/A;  . TONSILLECTOMY    . TRANSURETHRAL RESECTION OF PROSTATE  2009    Current Outpatient Medications on File Prior to Visit  Medication Sig Dispense Refill  . acetaminophen (TYLENOL) 325 MG tablet Take 325 mg by mouth 2 (two) times daily as needed for mild pain.    [name] alfuzosin (UROXATRAL) 10 MG 24 hr tablet Take 10 mg by mouth daily with breakfast.    . amLODipine (NORVASC) 5 MG tablet Take 1 tablet (5 mg total) by mouth daily. 90 tablet 3  . aspirin EC 81 MG tablet Take 81 mg by mouth at bedtime.     [name] atorvastatin (LIPITOR) 10 MG tablet Take 5 mg by mouth every other day.     . Cyanocobalamin (VITAMIN B 12 PO) Take 2,000 Units by mouth daily.     . diazepam (VALIUM) 5 MG tablet TAKE 1 TABLET BY MOUTH AT BEDTIME AS NEEDED FOR SLEEP  5  . docusate sodium (COLACE) 100 MG capsule Take 100 mg by mouth daily.     [name] LANTUS SOLOSTAR 100 UNIT/ML Solostar Pen Inject 18 Units into the skin daily at 10 pm. 15 mL 11  . loratadine (CLARITIN) 10 MG tablet Take 10 mg by mouth daily as needed for allergies.     . Magnesium 200 MG TABS Take 1 tablet (200 mg total) by mouth daily. 30 each 6  . metFORMIN (GLUCOPHAGE) 1000 MG tablet Take 1 tablet (1,000 mg total) by mouth 2 (two) times daily with a meal. 180 tablet 3  .  pantoprazole (PROTONIX) 40 MG tablet Take 1 tablet (40 mg total) by mouth daily. 90 tablet 5  . polyethylene glycol (MIRALAX / GLYCOLAX) packet Take 17 g by mouth daily as needed for moderate constipation.     . prasugrel (EFFIENT) 10 MG TABS tablet Take 1 tablet (10 mg total) by mouth daily. 90 tablet 3  . sitaGLIPtin (JANUVIA) 100 MG tablet Take 1 tablet (100 mg total) by mouth daily. 90 tablet 3  . lisinopril (PRINIVIL,ZESTRIL) 20 MG tablet Take 1 tablet (20 mg total) by mouth daily. 90 tablet 3  . metoprolol tartrate (LOPRESSOR) 25 MG tablet Take 1 tablet (25 mg total) by mouth 2 (two) times daily. 180 tablet 3   No current facility-administered medications on file prior to visit.     Allergies  Allergen Reactions  . Plavix [Clopidogrel Bisulfate] Shortness Of Breath  . Oxycontin [Oxycodone Hcl] Other (See Comments)    Malaise  . Penicillins Other (See Comments)  [place] Has patient had a PCN reaction causing immediate rash, facial/tongue/throat swelling, SOB or lightheadedness with hypotension: No Has patient had a PCN reaction causing severe rash involving mucus membranes or skin necrosis: No Has patient had a PCN reaction that required hospitalization No Has patient had a PCN reaction occurring within the last 10 years: No If all of the above answers are "NO", then may proceed with Cephalosporin use.   . Pioglitazone Other (See Comments)    Made his chest hurt  . Prednisone Other (See Comments)    Reaction: muscle aches and soreness  . Simvastatin Other (See Comments)    Myalgias    Family History  Problem Relation Age of Onset  . Ovarian cancer Mother   . Colon cancer Sister 43       deceased from colon cancer    Social History   Socioeconomic History  . Marital status: Married    Spouse name: Not on file  . Number of children: 2  . Years of education: Not on file  . Highest education level: Not on file  Social Needs  . Financial resource strain: Not on  file  . Food insecurity - worry: Not on file  . Food insecurity - inability: Not on file  . Transportation needs - medical: Not on file  . Transportation needs - non-medical: Not on file  Occupational History  . Occupation: Dietitian, [place]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    Comment: [employer]  Tobacco Use  . Smoking status: Former Smoker    Packs/day: 1.00    Years: 15.00    Pack years: 15.00    Types: Cigarettes    Start date: 03/08/1965    Last attempt to quit: 07/07/1993    Years since quitting: 23.6  . Smokeless tobacco: Never Used  Substance and Sexual Activity  . Alcohol use: Yes    Alcohol/week: 0.0 oz    Comment: 07/29/2016 "last drink was 4 wks ago; had a beer"  . Drug use: No  . Sexual activity: Not Currently  Other Topics Concern  . Not on file  Social History Narrative  . Not on file   Review of Systems  Constitutional: Negative for fever and weight loss.  HENT: Negative for ear discharge, ear pain, hearing loss and tinnitus.   Eyes: Negative for blurred vision, double vision, photophobia and pain.  Respiratory: Positive for cough. Negative for sputum production and shortness of breath.   Cardiovascular: Negative for chest pain and palpitations.  Gastrointestinal: Positive for heartburn. Negative for abdominal pain, blood in stool, constipation, diarrhea, melena, nausea and vomiting.  Genitourinary: Positive for hematuria. Negative for dysuria, flank pain, frequency and urgency.  Musculoskeletal: Positive for back pain. Negative for falls.  Neurological: Negative for dizziness, loss of consciousness and headaches.  Endo/Heme/Allergies: Positive for environmental allergies.  Psychiatric/Behavioral: Negative for depression, hallucinations, substance abuse and suicidal ideas. The patient is not nervous/anxious and does not have insomnia.    BP 121/72   Pulse 78   Temp 98.1 F (36.7 C) (Oral)   Resp 16   Ht 5\' 7"  (1.702 m)   Wt 178 lb 4 oz (80.9 kg)    SpO2 98%   BMI 27.92 kg/m   Physical Exam  Constitutional: He is oriented to person, place, and time and well-developed, well-nourished, and in no distress.  HENT:  Head: Normocephalic and atraumatic.  Right Ear: Tympanic membrane normal.  Left Ear: Tympanic membrane normal.  Nose: Rhinorrhea present. No mucosal edema. Right  sinus exhibits no maxillary sinus tenderness and no frontal sinus tenderness. Left sinus exhibits no maxillary sinus tenderness and no frontal sinus tenderness.  Mouth/Throat: Uvula is midline, oropharynx is clear and moist and mucous membranes are normal.  Eyes: Conjunctivae are normal.  Neck: Neck supple.  Cardiovascular: Normal rate, regular rhythm, normal heart sounds and intact distal pulses.  Pulmonary/Chest: Effort normal and breath sounds normal. No respiratory distress. He has no wheezes. He has no rales. He exhibits no tenderness.  Genitourinary: He exhibits no abnormal testicular mass, no testicular tenderness, no abnormal scrotal mass and no scrotal tenderness.  Neurological: He is alert and oriented to person, place, and time.  Skin: Skin is warm and dry. No rash noted.  Psychiatric: Affect normal.  Vitals reviewed.  Recent Results (from the past 2160 hour(s))  POCT Urinalysis Dipstick     Status: Abnormal   Collection Time: 03/08/17  9:16 AM  Result Value Ref Range   Color, UA yelow    Clarity, UA clear    Glucose, UA 250    Bilirubin, UA negative    Ketones, UA negative    Spec Grav, UA 1.020 1.010 - 1.025   Blood, UA negative    pH, UA 6.5 5.0 - 8.0   Protein, UA negative    Urobilinogen, UA 2.0 (A) 0.2 or 1.0 E.U./dL   Nitrite, UA negative    Leukocytes, UA Negative Negative   Appearance     Odor    CBC with Differential/Platelet     Status: Abnormal   Collection Time: 03/08/17 10:44 AM  Result Value Ref Range   WBC 7.1 4.0 - 10.5 K/uL   RBC 4.19 (L) 4.22 - 5.81 Mil/uL   Hemoglobin 14.0 13.0 - 17.0 g/dL   HCT 42.3 39.0 - 52.0 %    MCV 101.1 (H) 78.0 - 100.0 fl   MCHC 33.0 30.0 - 36.0 g/dL   RDW 13.4 11.5 - 15.5 %   Platelets 246.0 150.0 - 400.0 K/uL   Neutrophils Relative % 71.8 43.0 - 77.0 %   Lymphocytes Relative 18.5 12.0 - 46.0 %   Monocytes Relative 6.0 3.0 - 12.0 %   Eosinophils Relative 3.0 0.0 - 5.0 %   Basophils Relative 0.7 0.0 - 3.0 %   Neutro Abs 5.1 1.4 - 7.7 K/uL   Lymphs Abs 1.3 0.7 - 4.0 K/uL   Monocytes Absolute 0.4 0.1 - 1.0 K/uL   Eosinophils Absolute 0.2 0.0 - 0.7 K/uL   Basophils Absolute 0.1 0.0 - 0.1 K/uL  Comprehensive metabolic panel     Status: Abnormal   Collection Time: 03/08/17 10:44 AM  Result Value Ref Range   Sodium 137 135 - 145 mEq/L   Potassium 5.0 3.5 - 5.1 mEq/L   Chloride 99 96 - 112 mEq/L   CO2 32 19 - 32 mEq/L   Glucose, Bld 223 (H) 70 - 99 mg/dL   BUN 22 6 - 23 mg/dL   Creatinine, Ser 0.77 0.40 - 1.50 mg/dL   Total Bilirubin 0.6 0.2 - 1.2 mg/dL   Alkaline Phosphatase 44 39 - 117 U/L   AST 16 0 - 37 U/L   ALT 27 0 - 53 U/L   Total Protein 6.9 6.0 - 8.3 g/dL   Albumin 4.5 3.5 - 5.2 g/dL   Calcium 9.6 8.4 - 10.5 mg/dL   GFR 103.85 >60.00 mL/min  Lipid panel     Status: Abnormal   Collection Time: 03/08/17 10:44 AM  Result Value Ref Range  Cholesterol 135 0 - 200 mg/dL    Comment: ATP III Classification       Desirable:  < 200 mg/dL               Borderline High:  200 - 239 mg/dL          High:  > = 240 mg/dL   Triglycerides 76.0 0.0 - 149.0 mg/dL    Comment: Normal:  <150 mg/dLBorderline High:  150 - 199 mg/dL   HDL 37.50 (L) >39.00 mg/dL   VLDL 15.2 0.0 - 40.0 mg/dL   LDL Cholesterol 83 0 - 99 mg/dL   Total CHOL/HDL Ratio 4     Comment:                Men          Women1/2 Average Risk     3.4          3.3Average Risk          5.0          4.42X Average Risk          9.6          7.13X Average Risk          15.0          11.0                       NonHDL 97.81     Comment: NOTE:  Non-HDL goal should be 30 mg/dL higher than patient's LDL goal (i.e. LDL goal of  < 70 mg/dL, would have non-HDL goal of < 100 mg/dL)  Hemoglobin A1c     Status: Abnormal   Collection Time: 03/08/17 10:44 AM  Result Value Ref Range   Hgb A1c MFr Bld 8.0 (H) 4.6 - 6.5 %    Comment: Glycemic Control Guidelines for People with Diabetes:Non Diabetic:  <6%Goal of Therapy: <7%Additional Action Suggested:  >8%   PSA     Status: None   Collection Time: 03/08/17 10:44 AM  Result Value Ref Range   PSA 1.08 0.10 - 4.00 ng/mL    Comment: Test performed using Access Hybritech PSA Assay, a parmagnetic partical, chemiluminecent immunoassay.  Urine Microalbumin w/creat. ratio     Status: None   Collection Time: 03/08/17 10:44 AM  Result Value Ref Range   Microalb, Ur 1.2 0.0 - 1.9 mg/dL   Creatinine,U 116.4 mg/dL   Microalb Creat Ratio 1.1 0.0 - 30.0 mg/g   Assessment/Plan: Poorly controlled type 2 diabetes mellitus with circulatory disorder [facility]) Eye exam up-to-date. Foot exam updated today. No concerning findings. Recent A1C at 7.6. Will check BMP and microalbumin today. Patient wishing to have DM managed by primary care as he feels more comfortable here. Will take over management per his wishes.  Essential hypertension Asymptomatic. BP stable today. Continue current regimen. Follow-up with Cardiology as scheduled.   Need for pneumococcal vaccination Pneumovax given by RN today.  Hematuria Concern for stone giving history and other symptoms. Urine dip today with glucose. No blood noted. Will check KUB as patient wanting to avoid CT if possible. Increase fluids. If stone present we will need to schedule follow-up with his urologist.   Encounter for Medicare annual wellness exam RN Medicare wellness note reviewed.  Leeanne Rio, PA-C   Dyspepsia Stop Protonix. Will attempt trial of Dexilant. Diet reviewed. Follow-up with GI if not improving.     Leeanne Rio, PA-C

## 2017-03-08 NOTE — Assessment & Plan Note (Signed)
RN Medicare wellness note reviewed.  Leeanne Rio, PA-C

## 2017-03-09 ENCOUNTER — Telehealth: Payer: Self-pay | Admitting: Physician Assistant

## 2017-03-09 NOTE — Telephone Encounter (Signed)
The pt. called and requested a refill on Valium 5 mg. for sleep.  Stated he takes it  as needed, at night, if he can't sleep.  Advised will send message to Elyn Aquas, [place].

## 2017-03-10 ENCOUNTER — Other Ambulatory Visit: Payer: Self-pay | Admitting: Physician Assistant

## 2017-03-10 DIAGNOSIS — R319 Hematuria, unspecified: Secondary | ICD-10-CM

## 2017-03-10 MED ORDER — DIAZEPAM 5 MG PO TABS: 5.0000 mg | ORAL_TABLET | Freq: Every evening | ORAL | 0 refills | Status: DC | PRN

## 2017-03-10 NOTE — Telephone Encounter (Signed)
First time filling for patient. Rx 15 tablets sent to pharmacy.

## 2017-03-10 NOTE — Addendum Note (Signed)
Addended by: [name] on: 03/10/2017 09:13 AM   Modules accepted: Orders

## 2017-03-29 ENCOUNTER — Ambulatory Visit: Payer: Medicare Other | Admitting: Internal Medicine

## 2017-04-14 ENCOUNTER — Ambulatory Visit: Payer: Medicare Other | Admitting: Nurse Practitioner

## 2017-04-14 ENCOUNTER — Encounter: Payer: Self-pay | Admitting: Nurse Practitioner

## 2017-04-14 VITALS — BP 131/67 | HR 90 | Temp 98.0°F | Ht 67.0 in | Wt 179.4 lb

## 2017-04-14 DIAGNOSIS — K5900 Constipation, unspecified: Secondary | ICD-10-CM

## 2017-04-14 DIAGNOSIS — K219 Gastro-esophageal reflux disease without esophagitis: Secondary | ICD-10-CM

## 2017-04-14 MED ORDER — LANSOPRAZOLE 30 MG PO CPDR: 30.0000 mg | DELAYED_RELEASE_CAPSULE | Freq: Every day | ORAL | 2 refills | Status: DC

## 2017-04-14 NOTE — Patient Instructions (Addendum)
1. Taking Colace every day. 2. You can use MiraLAX anywhere from a half a dose to a full dose every other day to every day.  Essentially, make adjustments to the MiraLAX she take both dose and frequency depending on how you respond to it. 3. Stop taking Dexilant. 4. I have sent in a prescription for Prevacid (lansoprazole) 30 mg daily.  Take this once a day on an empty stomach. 5. If you have side effects from the lansoprazole you can stop taking it and go back to taking Tums. 6. Check the formulation of Alka-Seltzer you have and if it contains ibuprofen, Motrin, Advil, Aleve, naproxen, Naprosyn, any medicine known as "NSAID", then stop taking it. 7. Return for follow-up in 3 months. 8. Call us if you have any questions or concerns.

## 2017-04-14 NOTE — Progress Notes (Signed)
CC'ED TO PCP 

## 2017-04-14 NOTE — Progress Notes (Signed)
Referring Provider: Delorse Limber Primary Care Physician:  [name], PA-C Primary GI:  Dr. Gala Romney  Chief Complaint  Patient presents with  . Gastroesophageal Reflux    f/u. Feels he is doing well  . Constipation    f/u.     HPI:   Marcus Norton is a 77 y.o. male who presents for follow-up on GERD and constipation.  The patient was last seen in our office 10/12/2016 for the same.  Colonoscopy up-to-date 06/30/2016 which was essentially normal.  He typically has been managed on Protonix twice a day or once a day, Colace and MiraLAX dose adjusted for constipation.  At his last visit he was doing well on both of these regimens.  Requesting new prescription to indicate decrease to once daily Protonix dosing.  No other GI symptoms.  Recommended continue current medications, follow-up in 6 months.  Today he states he's doing ok overall. Protonix made him feel 'weird" so he stopped this. PCP started him on Dexilant which did the same. Has been on TUMS and/or Alka Seltzer and off PPI. Has had GERD about 2-3 times since. Has been making dietary changes as well. He is interested in trying Lansoprazole. Has tried Zantac. Is now on MiraLAX intermittently. When taking it every day he was having a lot of gas. Did take it 2 days in a row due to some hard stools. Is not taking Colace, stopped it due to his colon feeling "washed out." Denies hematochezia, melena, fever, chills, unintentional weight loss. Has had some intermittent nausea, denies vomiting. Denies chest pain, dyspnea, dizziness, lightheadedness, syncope, near syncope. Denies any other upper or lower GI symptoms.  Past Medical History:  Diagnosis Date  . 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 Arthritis    "lower back" (07/29/2016)  . ASCVD (arteriosclerotic cardiovascular disease)    -luminal irregularities in 1998 and 2005; normal EF  . Benign prostatic hypertrophy    status post transurethral resection of the prostate  .  CAD (coronary artery disease)    a. Cath 07/29/16 99% ostial RCA & 70% pRCA s/p cutting balloon angioplasty and single SYNERGY DES 2.5X28. 100% very Distal 1st RPLB lesion --> the occlusion site moved further distal with guidewire advancement;  40% ostial LAD; 50% Ost Cx to Prox Cx lesion.  . Coronary artery disease   . Dysphagia   . Erectile dysfunction   . GERD (gastroesophageal reflux disease)   . Heart murmur   . History of hiatal hernia   . History of kidney stones   . Hyperlipidemia   . Hypertension   . Seasonal allergies   . Tobacco abuse    20 pack years; discontinued 1995  . Type II diabetes mellitus ([place])     Past Surgical History:  Procedure Laterality Date  . APPENDECTOMY    . CATARACT EXTRACTION W/ INTRAOCULAR LENS  IMPLANT, BILATERAL Bilateral   . COLONOSCOPY  07/20/2011   Dr. Gala Romney: multiple hyperplastic polyps. Surveillance 2018  . COLONOSCOPY N/A 06/30/2016   Procedure: COLONOSCOPY;  Surgeon: Daneil Dolin, MD;  Location: [facility];  Service: Endoscopy;  Laterality: N/A;  10:00am  . COLONOSCOPY W/ BIOPSIES AND POLYPECTOMY  07/08/08, 06/2011   Dr Madolyn Frieze papilla, diminutive rectal polyp ablated the, left-sided diverticula, piecemeal polypectomy of hepatic flexure adenomatous polyp, diminutive polyps near hepatic flexure ablated  . CORONARY ANGIOPLASTY WITH STENT PLACEMENT  07/29/2016  . CORONARY STENT INTERVENTION N/A 07/29/2016   Procedure: Coronary Stent Intervention;  Surgeon: Ellyn Hack,  Leonie Green, MD;  Location: [facility];  Service: Cardiovascular;  Laterality: N/A;  RCA  . ESOPHAGOGASTRODUODENOSCOPY  2013   erosive reflux esophagitis, s/p empiric dilation. chronic duodenitis.   [name] LEFT HEART CATH AND CORONARY ANGIOGRAPHY N/A 07/29/2016   Procedure: Left Heart Cath and Coronary Angiography;  Surgeon: Leonie Man, MD;  Location: [facility];  Service: Cardiovascular;  Laterality: N/A;  . NASAL SEPTUM SURGERY    . NASAL SINUS SURGERY     "cleaned  out my sinus"  . PERIPHERAL VASCULAR CATHETERIZATION N/A 11/26/2015   Procedure: Abdominal Aortogram w/Lower Extremity;  Surgeon: Wellington Hampshire, MD;  Location: [facility];  Service: Cardiovascular;  Laterality: N/A;  . TONSILLECTOMY    . TRANSURETHRAL RESECTION OF PROSTATE  2009    Current Outpatient Medications  Medication Sig Dispense Refill  . acetaminophen (TYLENOL) 325 MG tablet Take 325 mg by mouth 2 (two) times daily as needed for mild pain.    [name] alfuzosin (UROXATRAL) 10 MG 24 hr tablet Take 10 mg by mouth daily with breakfast.    . amLODipine (NORVASC) 5 MG tablet Take 1 tablet (5 mg total) by mouth daily. 90 tablet 3  . aspirin EC 81 MG tablet Take 81 mg by mouth at bedtime.     [name] atorvastatin (LIPITOR) 10 MG tablet Take 5 mg by mouth every other day.     . Cyanocobalamin (VITAMIN B 12 PO) Take 2,000 Units by mouth daily.     . diazepam (VALIUM) 5 MG tablet Take 1 tablet (5 mg total) by mouth at bedtime as needed. for sleep 15 tablet 0  . LANTUS SOLOSTAR 100 UNIT/ML Solostar Pen Inject 18 Units into the skin daily at 10 pm. 15 mL 11  . lisinopril (PRINIVIL,ZESTRIL) 20 MG tablet Take 1 tablet (20 mg total) by mouth daily. 90 tablet 3  . loratadine (CLARITIN) 10 MG tablet Take 10 mg by mouth daily as needed for allergies.     . Magnesium 200 MG TABS Take 1 tablet (200 mg total) by mouth daily. 30 each 6  . metFORMIN (GLUCOPHAGE) 1000 MG tablet Take 1 tablet (1,000 mg total) by mouth 2 (two) times daily with a meal. 180 tablet 3  . metoprolol tartrate (LOPRESSOR) 25 MG tablet Take 1 tablet (25 mg total) by mouth 2 (two) times daily. 180 tablet 3  . polyethylene glycol (MIRALAX / GLYCOLAX) packet Take 17 g by mouth every other day.     . prasugrel (EFFIENT) 10 MG TABS tablet Take 1 tablet (10 mg total) by mouth daily. 90 tablet 3  . sitaGLIPtin (JANUVIA) 100 MG tablet Take 1 tablet (100 mg total) by mouth daily. 90 tablet 3  . tadalafil (CIALIS) 20 MG tablet Take 0.5-1 tablets  (10-20 mg total) by mouth every other day as needed for erectile dysfunction. 5 tablet 1  . docusate sodium (COLACE) 100 MG capsule Take 100 mg by mouth as needed for mild constipation.      No current facility-administered medications for this visit.     Allergies as of 04/14/2017 - Review Complete 04/14/2017  Allergen Reaction Noted  . Plavix [clopidogrel bisulfate] Shortness Of Breath 02/24/2016  . Oxycontin [oxycodone hcl] Other (See Comments) 05/14/2012  . Penicillins Other (See Comments) 04/30/2013  . Pioglitazone Other (See Comments) 04/30/2013  . Prednisone Other (See Comments) 07/14/2011  . Simvastatin Other (See Comments) 05/14/2012    Family History  Problem Relation Age of Onset  . Ovarian  cancer Mother   . Colon cancer Sister 51       deceased from colon cancer    Social History   Socioeconomic History  . Marital status: Married    Spouse name: None  . Number of children: 2  . Years of education: None  . Highest education level: None  Social Needs  . Financial resource strain: None  . Food insecurity - worry: None  . Food insecurity - inability: None  . Transportation needs - medical: None  . Transportation needs - non-medical: None  Occupational History  . 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 Smoking status: Former Smoker    Packs/day: 1.00    Years: 15.00    Pack years: 15.00    Types: Cigarettes    Start date: 03/08/1965    Last attempt to quit: 07/07/1993    Years since quitting: 23.7  . Smokeless tobacco: Never Used  Substance and Sexual Activity  . Alcohol use: No    Alcohol/week: 0.0 oz    Frequency: Never    Comment: 07/29/2016 "last drink was 4 wks ago; had a beer"  . Drug use: No  . Sexual activity: Not Currently  Other Topics Concern  . None  Social History Narrative  . None    Review of Systems: General: Negative for anorexia, weight loss, fever, chills, fatigue,  weakness. ENT: Negative for hoarseness, difficulty swallowing. CV: Negative for chest pain, angina, palpitations, peripheral edema.  Respiratory: Negative for dyspnea at rest, cough, sputum, wheezing.  GI: See history of present illness. Endo: Negative for unusual weight change.  Heme: Negative for bruising or bleeding. Allergy: Negative for rash or hives.   Physical Exam: BP 131/67   Pulse 90   Temp 98 F (36.7 C) (Oral)   Ht 5\' 7"  (1.702 m)   Wt 179 lb 6.4 oz (81.4 kg)   BMI 28.10 kg/m  General:   Alert and oriented. Pleasant and cooperative. Well-nourished and well-developed.  Eyes:  Without icterus, sclera clear and conjunctiva pink.  Ears:  Normal auditory acuity. Cardiovascular:  S1, S2 present with 2/6 blowing systolic murmur noted. Extremities without clubbing or edema. Respiratory:  Clear to auscultation bilaterally. No wheezes, rales, or rhonchi. No distress.  Gastrointestinal:  +BS, soft, non-tender and non-distended. No HSM noted. No guarding or rebound. No masses appreciated.  Rectal:  Deferred  Musculoskalatal:  Symmetrical without gross deformities. Neurologic:  Alert and oriented x4;  grossly normal neurologically. Psych:  Alert and cooperative. Normal mood and affect. Heme/Lymph/Immune: No excessive bruising noted.    04/14/2017 10:19 AM   Disclaimer: This note was dictated with voice recognition software. Similar sounding words can inadvertently be transcribed and may not be corrected upon review.

## 2017-04-14 NOTE — Assessment & Plan Note (Signed)
Patient was on a regimen of Colace daily and MiraLAX daily to every other day.  He was having significant gas on MiraLAX.  He stopped taking Colace around December.  Uses MiraLAX intermittently.  He did take it 2 days in a row when he had constipation with hard stools.  At this point I recommended restarting Colace daily.  He thinks he may have been having side effects from it which is why he stopped it.  I informed him if he has side effects he is free to stop it again.  He can also use MiraLAX as needed 1/2-1 dose every day to every other day as needed based on response.  Return for follow-up in 3 months to check his progress.

## 2017-04-14 NOTE — Assessment & Plan Note (Signed)
History of chronic GERD symptoms.  Prilosec did not work well.  Protonix is working well but he had side effects of "not feeling good."  His primary care provider changed him to [facility] and he has similar side effects.  At this point is interested in trying lansoprazole which is also covered by his insurance.  We will start this at once a day dosing.  He is currently taking Tums and Alka-Seltzer both as needed.  I recommended he may ensure Alka-Seltzer formulation he has does not contain NSAIDs and if so, avoid it.  We will have him return for follow-up in 3 months to check his progress.

## 2017-05-09 ENCOUNTER — Other Ambulatory Visit: Payer: Self-pay | Admitting: Physician Assistant

## 2017-07-12 ENCOUNTER — Ambulatory Visit: Payer: Medicare Other | Admitting: Nurse Practitioner

## 2017-07-12 ENCOUNTER — Ambulatory Visit: Payer: Medicare Other | Admitting: Cardiovascular Disease

## 2017-07-14 ENCOUNTER — Ambulatory Visit: Payer: Medicare Other | Admitting: Physician Assistant

## 2017-07-14 ENCOUNTER — Encounter: Payer: Self-pay | Admitting: Physician Assistant

## 2017-07-14 ENCOUNTER — Telehealth: Payer: Self-pay | Admitting: Cardiovascular Disease

## 2017-07-14 ENCOUNTER — Other Ambulatory Visit: Payer: Self-pay

## 2017-07-14 VITALS — BP 114/66 | HR 66 | Temp 97.5°F | Resp 15 | Ht 66.5 in | Wt 177.6 lb

## 2017-07-14 DIAGNOSIS — E7849 Other hyperlipidemia: Secondary | ICD-10-CM

## 2017-07-14 DIAGNOSIS — E1159 Type 2 diabetes mellitus with other circulatory complications: Secondary | ICD-10-CM | POA: Diagnosis not present

## 2017-07-14 DIAGNOSIS — I10 Essential (primary) hypertension: Secondary | ICD-10-CM

## 2017-07-14 DIAGNOSIS — R0602 Shortness of breath: Secondary | ICD-10-CM | POA: Diagnosis not present

## 2017-07-14 DIAGNOSIS — E1165 Type 2 diabetes mellitus with hyperglycemia: Secondary | ICD-10-CM | POA: Diagnosis not present

## 2017-07-14 LAB — LIPID PANEL
CHOL/HDL RATIO: 4
CHOLESTEROL: 137 mg/dL (ref 0–200)
HDL: 38.7 mg/dL — ABNORMAL LOW (ref >39.00)
LDL Cholesterol: 83 mg/dL (ref 0–99)
NonHDL: 98.43
TRIGLYCERIDES: 75 mg/dL (ref 0.0–149.0)
VLDL: 15 mg/dL (ref 0.0–40.0)

## 2017-07-14 LAB — COMPREHENSIVE METABOLIC PANEL
ALK PHOS: 46 U/L (ref 39–117)
ALT: 38 U/L (ref 0–53)
AST: 19 U/L (ref 0–37)
Albumin: 4.4 g/dL (ref 3.5–5.2)
BUN: 28 mg/dL — ABNORMAL HIGH (ref 6–23)
CALCIUM: 9.7 mg/dL (ref 8.4–10.5)
CO2: 30 meq/L (ref 19–32)
Chloride: 102 mEq/L (ref 96–112)
Creatinine, Ser: 0.74 mg/dL (ref 0.40–1.50)
GFR: 108.62 mL/min (ref >60.00)
Glucose, Bld: 206 mg/dL — ABNORMAL HIGH (ref 70–99)
Potassium: 5.4 mEq/L — ABNORMAL HIGH (ref 3.5–5.1)
Sodium: 138 mEq/L (ref 135–145)
Total Bilirubin: 0.6 mg/dL (ref 0.2–1.2)
Total Protein: 6.9 g/dL (ref 6.0–8.3)

## 2017-07-14 LAB — HEMOGLOBIN A1C: Hgb A1c MFr Bld: 8.5 % — ABNORMAL HIGH (ref 4.6–6.5)

## 2017-07-14 MED ORDER — LANTUS SOLOSTAR 100 UNIT/ML ~~LOC~~ SOPN: 18.0000 [IU] | PEN_INJECTOR | Freq: Every day | SUBCUTANEOUS | 11 refills | Status: DC

## 2017-07-14 NOTE — Patient Instructions (Signed)
Please go to the lab today for blood work.  I will call you with your results. We will alter treatment regimen(s) if indicated by your results.   Levada Dy is going to work on moving up your appointment with Cardiology.  Please stop by the front desk to speak with her. If there is any chest pain, shortness of breath at rest, dizziness, you need to call 911.    Diabetes and Foot Care Diabetes may cause you to have problems because of poor blood supply (circulation) to your feet and legs. This may cause the skin on your feet to become thinner, break easier, and heal more slowly. Your skin may become dry, and the skin may peel and crack. You may also have nerve damage in your legs and feet causing decreased feeling in them. You may not notice minor injuries to your feet that could lead to infections or more serious problems. Taking care of your feet is one of the most important things you can do for yourself. Follow these instructions at home:  Wear shoes at all times, even in the house. Do not go barefoot. Bare feet are easily injured.  Check your feet daily for blisters, cuts, and redness. If you cannot see the bottom of your feet, use a mirror or ask someone for help.  Wash your feet with warm water (do not use hot water) and mild soap. Then pat your feet and the areas between your toes until they are completely dry. Do not soak your feet as this can dry your skin.  Apply a moisturizing lotion or petroleum jelly (that does not contain alcohol and is unscented) to the skin on your feet and to dry, brittle toenails. Do not apply lotion between your toes.  Trim your toenails straight across. Do not dig under them or around the cuticle. File the edges of your nails with an emery board or nail file.  Do not cut corns or calluses or try to remove them with medicine.  Wear clean socks or stockings every day. Make sure they are not too tight. Do not wear knee-high stockings since they may decrease  blood flow to your legs.  Wear shoes that fit properly and have enough cushioning. To break in new shoes, wear them for just a few hours a day. This prevents you from injuring your feet. Always look in your shoes before you put them on to be sure there are no objects inside.  Do not cross your legs. This may decrease the blood flow to your feet.  If you find a minor scrape, cut, or break in the skin on your feet, keep it and the skin around it clean and dry. These areas may be cleansed with mild soap and water. Do not cleanse the area with peroxide, alcohol, or iodine.  When you remove an adhesive bandage, be sure not to damage the skin around it.  If you have a wound, look at it several times a day to make sure it is healing.  Do not use heating pads or hot water bottles. They may burn your skin. If you have lost feeling in your feet or legs, you may not know it is happening until it is too late.  Make sure your health care provider performs a complete foot exam at least annually or more often if you have foot problems. Report any cuts, sores, or bruises to your health care provider immediately. Contact a health care provider if:  You have an injury  that is not healing.  You have cuts or breaks in the skin.  You have an ingrown nail.  You notice redness on your legs or feet.  You feel burning or tingling in your legs or feet.  You have pain or cramps in your legs and feet.  Your legs or feet are numb.  Your feet always feel cold. Get help right away if:  There is increasing redness, swelling, or pain in or around a wound.  There is a red line that goes up your leg.  Pus is coming from a wound.  You develop a fever or as directed by your health care provider.  You notice a bad smell coming from an ulcer or wound. This information is not intended to replace advice given to you by your health care provider. Make sure you discuss any questions you have with your health care  provider. Document Released: 02/06/2000 Document Revised: 07/17/2015 Document Reviewed: 07/18/2012 Elsevier Interactive Patient Education  2017 [employer].

## 2017-07-14 NOTE — Telephone Encounter (Signed)
New Message   Levada Dy calling from [facility] patient is there now presenting with SOB. The doctor is trying to get  The patient in the office now

## 2017-07-14 NOTE — Telephone Encounter (Signed)
Spoke to nurse at [facility] being seen today and PA would like patient seen next week.     Appt scheduled Wed 5/29 with Kerin Ransom PA

## 2017-07-14 NOTE — Progress Notes (Signed)
History of Present Illness: Patient is a 77 y.o. male who presents to clinic today for follow-up of Diabetes Mellitus II, previously managed by Endocrinology but no longer wants to be seen due to distance and cost.  Patient currently on medication regimen of Lantus 18u QPM, Metformin 1000 mg BID, Januvia 100 mg QD.  Is taking medications as directed. Endorses sugars have been running ok overall with fasting sugars averaging in the 130-140 range.  Denies vision changes or tingling of extremities. Notes increased sugar levels over the past month due to steroid injections with his orthopedist. Fasting sugars are averaging at 200s since injection last week. Is trying to work on diet. Is taking ACEI as directed.  Latest Maintenance: A1C --  Lab Results  Component Value Date   HGBA1C 8.5 (H) 07/14/2017   Diabetic Eye Exam -- Up-to-date. Dr. Jorja Loa in [place].  Urine Microalbumin -- On ACEI. Will obtain today.  Foot Exam -- Due. Denies acute concerns with feet today.   Of note, at the end of the visit patient endorses that he is having some mild SOBOE on occasion with a pressure sensation. States this resolves at rest. Is sometimes there with exertion but other times not. Denies chest pain, racing heart or dizziness. Patient with history of CAD s/p stent placement. Has follow-up scheduled with [facility] cardiology in a couple of weeks. Is scheduled for ABI this coming week.  Past Medical History:  Diagnosis Date  . 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 Arthritis    "lower back" (07/29/2016)  . ASCVD (arteriosclerotic cardiovascular disease)    -luminal irregularities in 1998 and 2005; normal EF  . Benign prostatic hypertrophy    status post transurethral resection of the prostate  . CAD (coronary artery disease)    a. Cath 07/29/16 99% ostial RCA & 70% pRCA s/p cutting balloon angioplasty and single SYNERGY DES 2.5X28. 100% very Distal 1st RPLB lesion --> the occlusion site moved further distal with  guidewire advancement;  40% ostial LAD; 50% Ost Cx to Prox Cx lesion.  . Coronary artery disease   . Dysphagia   . Erectile dysfunction   . GERD (gastroesophageal reflux disease)   . Heart murmur   . History of hiatal hernia   . History of kidney stones   . Hyperlipidemia   . Hypertension   . Seasonal allergies   . Tobacco abuse    20 pack years; discontinued 1995  . Type II diabetes mellitus ([place])     Current Outpatient Medications on File Prior to Visit  Medication Sig Dispense Refill  . acetaminophen (TYLENOL) 325 MG tablet Take 325 mg by mouth 2 (two) times daily as needed for mild pain.    [name] alfuzosin (UROXATRAL) 10 MG 24 hr tablet Take 10 mg by mouth daily with breakfast.    . aspirin EC 81 MG tablet Take 81 mg by mouth at bedtime.     [name] atorvastatin (LIPITOR) 10 MG tablet Take 5 mg by mouth every other day.     . Cyanocobalamin (VITAMIN B 12 PO) Take 2,000 Units by mouth daily.     . diazepam (VALIUM) 5 MG tablet Take 1 tablet (5 mg total) by mouth at bedtime as needed. for sleep 15 tablet 0  . glucose blood test strip Accu-Chek Aviva Plus test strips    . lansoprazole (PREVACID) 30 MG capsule Take 1 capsule (30 mg total) by mouth daily at 12 noon. 30 capsule 2  . metFORMIN (GLUCOPHAGE) 1000 MG tablet  TAKE 1 TABLET(1000 MG) BY MOUTH TWICE DAILY WITH A MEAL 180 tablet 0  . prasugrel (EFFIENT) 10 MG TABS tablet Take 1 tablet (10 mg total) by mouth daily. 90 tablet 3  . sitaGLIPtin (JANUVIA) 100 MG tablet Take 1 tablet (100 mg total) by mouth daily. 90 tablet 3  . tadalafil (CIALIS) 20 MG tablet Take 0.5-1 tablets (10-20 mg total) by mouth every other day as needed for erectile dysfunction. 5 tablet 1  . amLODipine (NORVASC) 5 MG tablet Take 1 tablet (5 mg total) by mouth daily. 90 tablet 3  . docusate sodium (COLACE) 100 MG capsule Take 100 mg by mouth as needed for mild constipation.     [name] lisinopril (PRINIVIL,ZESTRIL) 20 MG tablet Take 1 tablet (20 mg total) by mouth daily. 90  tablet 3  . loratadine (CLARITIN) 10 MG tablet Take 10 mg by mouth daily as needed for allergies.     . Magnesium 200 MG TABS Take 1 tablet (200 mg total) by mouth daily. (Patient not taking: Reported on 07/14/2017) 30 each 6  . metoprolol tartrate (LOPRESSOR) 25 MG tablet Take 1 tablet (25 mg total) by mouth 2 (two) times daily. 180 tablet 3  . polyethylene glycol (MIRALAX / GLYCOLAX) packet Take 17 g by mouth every other day.      No current facility-administered medications on file prior to visit.     Allergies  Allergen Reactions  . Plavix [Clopidogrel Bisulfate] Shortness Of Breath  . Oxycontin [Oxycodone Hcl] Other (See Comments)    Malaise  . Penicillins Other (See Comments)    [place]outh Has patient had a PCN reaction causing immediate rash, facial/tongue/throat swelling, SOB or lightheadedness with hypotension: No Has patient had a PCN reaction causing severe rash involving mucus membranes or skin necrosis: No Has patient had a PCN reaction that required hospitalization No Has patient had a PCN reaction occurring within the last 10 years: No If all of the above answers are "NO", then may proceed with Cephalosporin use.   . Pioglitazone Other (See Comments)    Made his chest hurt  . Prednisone Other (See Comments)    Reaction: muscle aches and soreness  . Simvastatin Other (See Comments)    Myalgias    Family History  Problem Relation Age of Onset  . Ovarian cancer Mother   . Colon cancer Sister 49       deceased from colon cancer    Social History   Socioeconomic History  . Marital status: Married    Spouse name: Not on file  . Number of children: 2  . Years of education: Not on file  . Highest education level: Not on file  Occupational History  . 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 Financial resource strain: Not on file  . Food insecurity:    Worry: Not on file    Inability: Not on  file  . Transportation needs:    Medical: Not on file    Non-medical: Not on file  Tobacco Use  . Smoking status: Former Smoker    Packs/day: 1.00    Years: 15.00    Pack years: 15.00    Types: Cigarettes    Start date: 03/08/1965    Last attempt to quit: 07/07/1993    Years since quitting: 24.0  . Smokeless tobacco: Never Used  Substance and Sexual Activity  . Alcohol use: No    Alcohol/week: 0.0 oz  Frequency: Never    Comment: 07/29/2016 "last drink was 4 wks ago; had a beer"  . Drug use: No  . Sexual activity: Not Currently  Lifestyle  . Physical activity:    Days per week: Not on file    Minutes per session: Not on file  . Stress: Not on file  Relationships  . Social connections:    Talks on phone: Not on file    Gets together: Not on file    Attends religious service: Not on file    Active member of club or organization: Not on file    Attends meetings of clubs or organizations: Not on file    Relationship status: Not on file  Other Topics Concern  . Not on file  Social History Narrative  . Not on file   Review of Systems: Pertinent ROS are listed in HPI.  Physical Examination: BP 114/66   Pulse 66   Temp (!) 97.5 F (36.4 C) (Oral)   Resp 15   Ht 5' 6.5" (1.689 m)   Wt 177 lb 9.6 oz (80.6 kg)   SpO2 95%   BMI 28.24 kg/m  General appearance: alert, cooperative, appears stated age and no distress Head: Normocephalic, without obvious abnormality, atraumatic Ears: normal TM's and external ear canals both ears Lungs: clear to auscultation bilaterally Heart: regular rate and rhythm, S1, S2 normal, no murmur, click, rub or gallop Pulses: 2+ and symmetric Lymph nodes: Cervical, supraclavicular, and axillary nodes normal. Neurologic: Alert and oriented X 3, normal strength and tone. Normal symmetric reflexes. Normal coordination and gait  Diabetic Foot Exam - Simple   Simple Foot Form Diabetic Foot exam was performed with the following findings:  Yes  07/14/2017  9:45 AM  Visual Inspection No deformities, no ulcerations, no other skin breakdown bilaterally:  Yes Sensation Testing Intact to touch and monofilament testing bilaterally:  Yes Pulse Check Posterior Tibialis and Dorsalis pulse intact bilaterally:  Yes Comments     Assessment/Plan: Essential hypertension BP stable. Pulse regular. Continue current medication regimen. Will obtain labs today.   Poorly controlled type 2 diabetes mellitus with circulatory disorder (HCC) Mildly worsened 2/2 steroid injections. Eye exam up-to-date. Foot exam updated today. Immunizations are up-to-date. Will assess microalbumin. Continue ACEI. Will obtain CMP and A1C today. Will alter regimen according to A1C results. Discussed carb limitation and exercise recommendations.   Hyperlipidemia Will obtain repeat fasting lipids today. Continue current regimen.   SOBOE (shortness of breath on exertion) Noted at end of visit. Has been present since stent placement per patient but has not discussed with new cardiologist. Asymptomatic presently. EKG with sinus bradycardia and some t-wave abnormalities, relatively unchanged from prior tracing. Will have Pharr call [facility] to see if they can expedite patient appointment. Alarm signs/symptoms reviewed that would prompt ER assessment.

## 2017-07-15 ENCOUNTER — Telehealth: Payer: Self-pay | Admitting: Physician Assistant

## 2017-07-15 ENCOUNTER — Other Ambulatory Visit: Payer: Self-pay | Admitting: *Deleted

## 2017-07-15 ENCOUNTER — Other Ambulatory Visit: Payer: Self-pay | Admitting: Physician Assistant

## 2017-07-15 DIAGNOSIS — K219 Gastro-esophageal reflux disease without esophagitis: Secondary | ICD-10-CM

## 2017-07-15 DIAGNOSIS — R0609 Other forms of dyspnea: Secondary | ICD-10-CM | POA: Insufficient documentation

## 2017-07-15 DIAGNOSIS — K5900 Constipation, unspecified: Secondary | ICD-10-CM

## 2017-07-15 DIAGNOSIS — E875 Hyperkalemia: Secondary | ICD-10-CM

## 2017-07-15 MED ORDER — LANSOPRAZOLE 30 MG PO CPDR: 30.0000 mg | DELAYED_RELEASE_CAPSULE | Freq: Every day | ORAL | 2 refills | Status: DC

## 2017-07-15 NOTE — Assessment & Plan Note (Signed)
Will obtain repeat fasting lipids today. Continue current regimen.

## 2017-07-15 NOTE — Assessment & Plan Note (Signed)
BP stable. Pulse regular. Continue current medication regimen. Will obtain labs today.

## 2017-07-15 NOTE — Telephone Encounter (Signed)
Copied from [place] [id number]. Topic: Inquiry >> Jul 15, 2017  2:30 PM Pricilla Handler wrote: Reason for CRM: Patient called requesting a refill of Lansoprazole (PREVACID) 30 MG capsule. Patient is completely out. Patient's preferred pharmacy is [facility] [place],  - [id number] [address] [phone] (Phone)  [phone] (Fax).       Thank You!!!

## 2017-07-15 NOTE — Assessment & Plan Note (Signed)
Mildly worsened 2/2 steroid injections. Eye exam up-to-date. Foot exam updated today. Immunizations are up-to-date. Will assess microalbumin. Continue ACEI. Will obtain CMP and A1C today. Will alter regimen according to A1C results. Discussed carb limitation and exercise recommendations.

## 2017-07-15 NOTE — Telephone Encounter (Signed)
Rx filled by office 

## 2017-07-15 NOTE — Assessment & Plan Note (Signed)
Noted at end of visit. Has been present since stent placement per patient but has not discussed with new cardiologist. Asymptomatic presently. EKG with sinus bradycardia and some t-wave abnormalities, relatively unchanged from prior tracing. Will have Brielle call [facility] to see if they can expedite patient appointment. Alarm signs/symptoms reviewed that would prompt ER assessment.

## 2017-07-19 ENCOUNTER — Telehealth: Payer: Self-pay | Admitting: Physician Assistant

## 2017-07-19 NOTE — Telephone Encounter (Signed)
Copied from [place] [id number] [phone]. Topic: Quick Communication - See Telephone Encounter >> Jul 19, 2017  4:44 PM Neva Seat wrote: Fasting morning blood glucose levels  May: 24 - 244 25 - 162 26 - 227 27 - 196 28 - 239

## 2017-07-20 ENCOUNTER — Encounter: Payer: Self-pay | Admitting: Cardiology

## 2017-07-20 ENCOUNTER — Other Ambulatory Visit: Payer: Self-pay | Admitting: Emergency Medicine

## 2017-07-20 ENCOUNTER — Ambulatory Visit: Payer: Medicare Other | Admitting: Cardiology

## 2017-07-20 VITALS — BP 120/52 | HR 77 | Ht 67.0 in | Wt 179.2 lb

## 2017-07-20 DIAGNOSIS — E118 Type 2 diabetes mellitus with unspecified complications: Secondary | ICD-10-CM

## 2017-07-20 DIAGNOSIS — Z9861 Coronary angioplasty status: Secondary | ICD-10-CM | POA: Diagnosis not present

## 2017-07-20 DIAGNOSIS — E782 Mixed hyperlipidemia: Secondary | ICD-10-CM

## 2017-07-20 DIAGNOSIS — R0609 Other forms of dyspnea: Secondary | ICD-10-CM

## 2017-07-20 DIAGNOSIS — I2510 Atherosclerotic heart disease of native coronary artery without angina pectoris: Secondary | ICD-10-CM

## 2017-07-20 DIAGNOSIS — I10 Essential (primary) hypertension: Secondary | ICD-10-CM | POA: Diagnosis not present

## 2017-07-20 DIAGNOSIS — R011 Cardiac murmur, unspecified: Secondary | ICD-10-CM | POA: Diagnosis not present

## 2017-07-20 DIAGNOSIS — Z794 Long term (current) use of insulin: Secondary | ICD-10-CM

## 2017-07-20 DIAGNOSIS — IMO0001 Reserved for inherently not codable concepts without codable children: Secondary | ICD-10-CM

## 2017-07-20 DIAGNOSIS — R0600 Dyspnea, unspecified: Secondary | ICD-10-CM

## 2017-07-20 MED ORDER — LANTUS SOLOSTAR 100 UNIT/ML ~~LOC~~ SOPN: 18.0000 [IU] | PEN_INJECTOR | Freq: Every day | SUBCUTANEOUS | 11 refills | Status: DC

## 2017-07-20 NOTE — Telephone Encounter (Signed)
Thank you for the update.  Please tell Mr. Gerrard to increase his Lantus by 2 units every 3 days until fasting sugars are averaging 100-150 consistently. Call me in 1 week with readings.

## 2017-07-20 NOTE — Assessment & Plan Note (Signed)
RCA PCI with DES June 2018- residual 40-50% proximal CFX and LAD

## 2017-07-20 NOTE — Assessment & Plan Note (Signed)
Followed by PCP

## 2017-07-20 NOTE — Assessment & Plan Note (Signed)
LDL 83 07/14/17 on Lipitor 10 mg

## 2017-07-20 NOTE — Telephone Encounter (Signed)
Advised patient wife of increase in Lantus insulin by 2 units every 3 days until sugar readings are averaging between 100-150. She will advised patient to call back in 1 week with readings for further adjustments. Medications updated in chart

## 2017-07-20 NOTE — Progress Notes (Signed)
07/20/2017 Marcus Norton   Nov 12, 1940  [id number]  Primary Physician [name], PA-C Primary Cardiologist: Dr Fletcher Anon  HPI:  77 y/o male with a history of CAD, PVD, DM, HTN, and HLD, here with vague chest discomfort and exertional fatigue. He says he felt better "for two days" after his PCI in June 2018. He told his PCP "I feel like I should be able to do more". His PCP referred him here for further revaluation. Previously his Coreg was changed to Lopressor with some improvement in generalized fatigue.    Current Outpatient Medications  Medication Sig Dispense Refill  . acetaminophen (TYLENOL) 325 MG tablet Take 325 mg by mouth 2 (two) times daily as needed for mild pain.    [name] alfuzosin (UROXATRAL) 10 MG 24 hr tablet Take 10 mg by mouth daily with breakfast.    . aspirin EC 81 MG tablet Take 81 mg by mouth at bedtime.     [name] atorvastatin (LIPITOR) 10 MG tablet Take 5 mg by mouth every other day.     . Cyanocobalamin (VITAMIN B 12 PO) Take 2,000 Units by mouth daily.     . diazepam (VALIUM) 5 MG tablet Take 1 tablet (5 mg total) by mouth at bedtime as needed. for sleep 15 tablet 0  . docusate sodium (COLACE) 100 MG capsule Take 100 mg by mouth as needed for mild constipation.     [name] glucose blood test strip Accu-Chek Aviva Plus test strips    . lansoprazole (PREVACID) 30 MG capsule Take 1 capsule (30 mg total) by mouth daily at 12 noon. 30 capsule 2  . LANTUS SOLOSTAR 100 UNIT/ML Solostar Pen Inject 18 Units into the skin daily at 10 pm. Increase by 2 units every 3 days 15 mL 11  . loratadine (CLARITIN) 10 MG tablet Take 10 mg by mouth daily as needed for allergies.     . Magnesium 200 MG TABS Take 1 tablet (200 mg total) by mouth daily. 30 each 6  . metFORMIN (GLUCOPHAGE) 1000 MG tablet TAKE 1 TABLET(1000 MG) BY MOUTH TWICE DAILY WITH A MEAL 180 tablet 0  . polyethylene glycol (MIRALAX / GLYCOLAX) packet Take 17 g by mouth every other day.     . prasugrel (EFFIENT) 10 MG TABS  tablet Take 1 tablet (10 mg total) by mouth daily. 90 tablet 3  . sitaGLIPtin (JANUVIA) 100 MG tablet Take 1 tablet (100 mg total) by mouth daily. 90 tablet 3  . tadalafil (CIALIS) 20 MG tablet Take 0.5-1 tablets (10-20 mg total) by mouth every other day as needed for erectile dysfunction. 5 tablet 1  . amLODipine (NORVASC) 5 MG tablet Take 1 tablet (5 mg total) by mouth daily. 90 tablet 3  . lisinopril (PRINIVIL,ZESTRIL) 20 MG tablet Take 1 tablet (20 mg total) by mouth daily. 90 tablet 3  . metoprolol tartrate (LOPRESSOR) 25 MG tablet Take 1 tablet (25 mg total) by mouth 2 (two) times daily. 180 tablet 3   No current facility-administered medications for this visit.     Allergies  Allergen Reactions  . Plavix [Clopidogrel Bisulfate] Shortness Of Breath  . Oxycontin [Oxycodone Hcl] Other (See Comments)    Malaise  . Penicillins Other (See Comments)    [place] Has patient had a PCN reaction causing immediate rash, facial/tongue/throat swelling, SOB or lightheadedness with hypotension: No Has patient had a PCN reaction causing severe rash involving mucus membranes or skin necrosis: No Has patient had a PCN reaction that  required hospitalization No Has patient had a PCN reaction occurring within the last 10 years: No If all of the above answers are "NO", then may proceed with Cephalosporin use.   . Pioglitazone Other (See Comments)    Made his chest hurt  . Prednisone Other (See Comments)    Reaction: muscle aches and soreness  . Simvastatin Other (See Comments)    Myalgias    Past Medical History:  Diagnosis Date  . 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 Arthritis    "lower back" (07/29/2016)  . ASCVD (arteriosclerotic cardiovascular disease)    -luminal irregularities in 1998 and 2005; normal EF  . Benign prostatic hypertrophy    status post transurethral resection of the prostate  . CAD (coronary artery disease)    a. Cath 07/29/16 99% ostial RCA & 70% pRCA s/p cutting  balloon angioplasty and single SYNERGY DES 2.5X28. 100% very Distal 1st RPLB lesion --> the occlusion site moved further distal with guidewire advancement;  40% ostial LAD; 50% Ost Cx to Prox Cx lesion.  . Coronary artery disease   . Dysphagia   . Erectile dysfunction   . GERD (gastroesophageal reflux disease)   . Heart murmur   . History of hiatal hernia   . History of kidney stones   . Hyperlipidemia   . Hypertension   . Seasonal allergies   . Tobacco abuse    20 pack years; discontinued 1995  . Type II diabetes mellitus ([place])     Social History   Socioeconomic History  . Marital status: Married    Spouse name: Not on file  . Number of children: 2  . Years of education: Not on file  . Highest education level: Not on file  Occupational History  . Occupation: Dietitian, [employer]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    Comment: Gaffer of agriculture  Social Needs  . Financial resource strain: Not on file  . Food insecurity:    Worry: Not on file    Inability: Not on file  . Transportation needs:    Medical: Not on file    Non-medical: Not on file  Tobacco Use  . Smoking status: Former Smoker    Packs/day: 1.00    Years: 15.00    Pack years: 15.00    Types: Cigarettes    Start date: 03/08/1965    Last attempt to quit: 07/07/1993    Years since quitting: 24.0  . Smokeless tobacco: Never Used  Substance and Sexual Activity  . Alcohol use: No    Alcohol/week: 0.0 oz    Frequency: Never    Comment: 07/29/2016 "last drink was 4 wks ago; had a beer"  . Drug use: No  . Sexual activity: Not Currently  Lifestyle  . Physical activity:    Days per week: Not on file    Minutes per session: Not on file  . Stress: Not on file  Relationships  . Social connections:    Talks on phone: Not on file    Gets together: Not on file    Attends religious service: Not on file    Active member of club or organization: Not on file    Attends meetings of clubs or organizations: Not on file     Relationship status: Not on file  . Intimate partner violence:    Fear of current or ex partner: Not on file    Emotionally abused: Not on file    Physically abused: Not on file    Forced sexual activity: Not  on file  Other Topics Concern  . Not on file  Social History Narrative  . Not on file     Family History  Problem Relation Age of Onset  . Ovarian cancer Mother   . Colon cancer Sister 33       deceased from colon cancer     Review of Systems: General: negative for chills, fever, night sweats or weight changes.  Cardiovascular: negative for chest pain, edema, orthopnea, palpitations, paroxysmal nocturnal dyspnea  Dermatological: negative for rash Respiratory: negative for cough or wheezing Urologic: negative for hematuria Abdominal: negative for nausea, vomiting, diarrhea, bright red blood per rectum, melena, or hematemesis DJD hips Neurologic: negative for visual changes, syncope, or dizziness All other systems reviewed and are otherwise negative except as noted above.    Blood pressure (!) 120/52, pulse 77, height 5\' 7"  (1.702 m), weight 179 lb 3.2 oz (81.3 kg).  General appearance: alert, cooperative and no distress Neck: no JVD and transmitted murmur Lungs: clear to auscultation bilaterally Heart: regular rate and rhythm and 2/6 systolic murmur AOV Extremities: extremities normal, atraumatic, no cyanosis or edema Skin: Skin color, texture, turgor normal. No rashes or lesions Neurologic: Grossly normal  EKG 07/14/17- NSR, SB, LAD, incomplete RBBB  ASSESSMENT AND PLAN:   Exertional dyspnea Pt seen today complaining of exertional fatigue and dyspnea. "I just feel like I should be able to do more".   CAD S/P percutaneous coronary angioplasty RCA PCI with DES June 2018- residual 40-50% proximal CFX and LAD  Essential hypertension Controlled  Insulin dependent diabetes mellitus with complications (HCC) Followed by PCP  Hyperlipidemia LDL 83 07/14/17 on  Lipitor 10 mg   PLAN  I suggested we proceed with an echo (? If his aortic sclerosis has progressed to aortic stenosis), and a Lexiscan Myoview. He'll f/u with dr Fletcher Anon as scheduled in June.   Kerin Ransom PA-C 07/20/2017 2:37 PM

## 2017-07-20 NOTE — Patient Instructions (Addendum)
Medication Instructions: Your physician recommends that you continue on your current medications as directed. Please refer to the Current Medication list given to you today.  If you need a refill on your cardiac medications before your next appointment, please call your pharmacy.     Procedures/Testing: Your physician has requested that you have an echocardiogram. Echocardiography is a painless test that uses sound waves to create images of your heart. It provides your doctor with information about the size and shape of your heart and how well your heart's chambers and valves are working. This procedure takes approximately one hour. There are no restrictions for this procedure. [place]   Your physician has requested that you have a lexiscan myoview. For further information please visit [email or web address]. Please follow instruction sheet, as given. [facility] [place]. [address]    Follow-Up: Your physician wants you to keep your follow-up appointment with Dr. Fletcher Anon on 08/02/17  Special Instructions:    Thank you for choosing [facility] at [facility]!!

## 2017-07-20 NOTE — Assessment & Plan Note (Signed)
Controlled.  

## 2017-07-20 NOTE — Assessment & Plan Note (Signed)
Pt seen today complaining of exertional fatigue and dyspnea. "I just feel like I should be able to do more".

## 2017-07-22 ENCOUNTER — Ambulatory Visit (HOSPITAL_BASED_OUTPATIENT_CLINIC_OR_DEPARTMENT_OTHER)
Admission: RE | Admit: 2017-07-22 | Discharge: 2017-07-22 | Disposition: A | Discharge status: Home / Self Care | Payer: Medicare Other | Source: Ambulatory Visit | Attending: Cardiovascular Disease | Admitting: Cardiovascular Disease

## 2017-07-22 ENCOUNTER — Ambulatory Visit (HOSPITAL_COMMUNITY)
Admission: RE | Admit: 2017-07-22 | Discharge: 2017-07-22 | Disposition: A | Discharge status: Home / Self Care | Payer: Medicare Other | Source: Ambulatory Visit | Attending: Cardiovascular Disease | Admitting: Cardiovascular Disease

## 2017-07-22 ENCOUNTER — Telehealth (HOSPITAL_COMMUNITY): Payer: Self-pay

## 2017-07-22 DIAGNOSIS — E785 Hyperlipidemia, unspecified: Secondary | ICD-10-CM | POA: Insufficient documentation

## 2017-07-22 DIAGNOSIS — I2510 Atherosclerotic heart disease of native coronary artery without angina pectoris: Secondary | ICD-10-CM | POA: Diagnosis not present

## 2017-07-22 DIAGNOSIS — I10 Essential (primary) hypertension: Secondary | ICD-10-CM | POA: Diagnosis not present

## 2017-07-22 DIAGNOSIS — I739 Peripheral vascular disease, unspecified: Secondary | ICD-10-CM

## 2017-07-22 DIAGNOSIS — R9389 Abnormal findings on diagnostic imaging of other specified body structures: Secondary | ICD-10-CM | POA: Insufficient documentation

## 2017-07-22 DIAGNOSIS — Z87891 Personal history of nicotine dependence: Secondary | ICD-10-CM | POA: Insufficient documentation

## 2017-07-22 DIAGNOSIS — E119 Type 2 diabetes mellitus without complications: Secondary | ICD-10-CM | POA: Diagnosis not present

## 2017-07-22 NOTE — Telephone Encounter (Signed)
Encounter complete. 

## 2017-07-25 ENCOUNTER — Other Ambulatory Visit: Payer: Self-pay

## 2017-07-25 ENCOUNTER — Ambulatory Visit (HOSPITAL_COMMUNITY): Payer: Medicare Other | Attending: Cardiovascular Disease

## 2017-07-25 DIAGNOSIS — E785 Hyperlipidemia, unspecified: Secondary | ICD-10-CM | POA: Diagnosis not present

## 2017-07-25 DIAGNOSIS — I350 Nonrheumatic aortic (valve) stenosis: Secondary | ICD-10-CM | POA: Insufficient documentation

## 2017-07-25 DIAGNOSIS — E119 Type 2 diabetes mellitus without complications: Secondary | ICD-10-CM | POA: Diagnosis not present

## 2017-07-25 DIAGNOSIS — Z87891 Personal history of nicotine dependence: Secondary | ICD-10-CM | POA: Insufficient documentation

## 2017-07-25 DIAGNOSIS — R0609 Other forms of dyspnea: Secondary | ICD-10-CM | POA: Diagnosis not present

## 2017-07-25 DIAGNOSIS — R011 Cardiac murmur, unspecified: Secondary | ICD-10-CM | POA: Diagnosis not present

## 2017-07-25 DIAGNOSIS — I119 Hypertensive heart disease without heart failure: Secondary | ICD-10-CM | POA: Diagnosis not present

## 2017-07-25 DIAGNOSIS — I2510 Atherosclerotic heart disease of native coronary artery without angina pectoris: Secondary | ICD-10-CM | POA: Diagnosis not present

## 2017-07-27 ENCOUNTER — Other Ambulatory Visit: Payer: Self-pay | Admitting: Physician Assistant

## 2017-07-27 ENCOUNTER — Ambulatory Visit (HOSPITAL_COMMUNITY)
Admission: RE | Admit: 2017-07-27 | Discharge: 2017-07-27 | Disposition: A | Discharge status: Home / Self Care | Payer: Medicare Other | Source: Ambulatory Visit | Attending: Cardiology | Admitting: Cardiology

## 2017-07-27 DIAGNOSIS — E1151 Type 2 diabetes mellitus with diabetic peripheral angiopathy without gangrene: Secondary | ICD-10-CM | POA: Insufficient documentation

## 2017-07-27 DIAGNOSIS — K219 Gastro-esophageal reflux disease without esophagitis: Secondary | ICD-10-CM | POA: Diagnosis not present

## 2017-07-27 DIAGNOSIS — I10 Essential (primary) hypertension: Secondary | ICD-10-CM | POA: Insufficient documentation

## 2017-07-27 DIAGNOSIS — Z794 Long term (current) use of insulin: Secondary | ICD-10-CM | POA: Insufficient documentation

## 2017-07-27 DIAGNOSIS — R0609 Other forms of dyspnea: Secondary | ICD-10-CM | POA: Diagnosis present

## 2017-07-27 DIAGNOSIS — Z8249 Family history of ischemic heart disease and other diseases of the circulatory system: Secondary | ICD-10-CM | POA: Insufficient documentation

## 2017-07-27 DIAGNOSIS — R5383 Other fatigue: Secondary | ICD-10-CM | POA: Insufficient documentation

## 2017-07-27 DIAGNOSIS — Z87891 Personal history of nicotine dependence: Secondary | ICD-10-CM | POA: Diagnosis not present

## 2017-07-27 DIAGNOSIS — I2510 Atherosclerotic heart disease of native coronary artery without angina pectoris: Secondary | ICD-10-CM | POA: Insufficient documentation

## 2017-07-27 MED ORDER — REGADENOSON 0.4 MG/5ML IV SOLN
0.4000 mg | Freq: Once | INTRAVENOUS | Status: AC
  Administered 2017-07-27: 0.4 mg via INTRAVENOUS

## 2017-07-27 MED ORDER — TECHNETIUM TC 99M TETROFOSMIN IV KIT
30.2000 | PACK | Freq: Once | INTRAVENOUS | Status: AC | PRN
  Administered 2017-07-27: 30.2 via INTRAVENOUS
  Filled 2017-07-27: qty 31

## 2017-07-27 MED ORDER — TECHNETIUM TC 99M TETROFOSMIN IV KIT
10.7000 | PACK | Freq: Once | INTRAVENOUS | Status: AC | PRN
  Administered 2017-07-27: 10.7 via INTRAVENOUS
  Filled 2017-07-27: qty 11

## 2017-07-28 ENCOUNTER — Other Ambulatory Visit (INDEPENDENT_AMBULATORY_CARE_PROVIDER_SITE_OTHER): Payer: Medicare Other

## 2017-07-28 DIAGNOSIS — E875 Hyperkalemia: Secondary | ICD-10-CM

## 2017-07-28 LAB — MYOCARDIAL PERFUSION IMAGING
LV dias vol: 85 mL (ref 62–150)
LV sys vol: 32 mL
Peak HR: 87 {beats}/min
Rest HR: 68 {beats}/min
SDS: 1
SRS: 0
SSS: 1
TID: 1.22

## 2017-07-28 LAB — BASIC METABOLIC PANEL
BUN: 21 mg/dL (ref 6–23)
CALCIUM: 8.9 mg/dL (ref 8.4–10.5)
CO2: 27 mEq/L (ref 19–32)
Chloride: 104 mEq/L (ref 96–112)
Creatinine, Ser: 0.69 mg/dL (ref 0.40–1.50)
GFR: 117.75 mL/min (ref >60.00)
Glucose, Bld: 192 mg/dL — ABNORMAL HIGH (ref 70–99)
POTASSIUM: 4.3 meq/L (ref 3.5–5.1)
SODIUM: 139 meq/L (ref 135–145)

## 2017-07-29 ENCOUNTER — Encounter: Payer: Self-pay | Admitting: Emergency Medicine

## 2017-08-01 ENCOUNTER — Telehealth: Payer: Self-pay | Admitting: Cardiovascular Disease

## 2017-08-01 DIAGNOSIS — I739 Peripheral vascular disease, unspecified: Secondary | ICD-10-CM

## 2017-08-01 NOTE — Telephone Encounter (Signed)
Pt calling   Stating he returning call to nurse concerning results.please call pt

## 2017-08-01 NOTE — Telephone Encounter (Signed)
Patient made aware of results and verbalized his understanding. Patient has an appointment on 08/02/17 with Dr. Fletcher Anon. Repeat studies have been ordered for a year from now.   Notes recorded by Wellington Hampshire, MD on 07/27/2017 at 5:08 PM EDT Patent iliac stents and stable ABI. Repeat studies in 1 year.  Notes recorded by Erlene Quan, PA-C on 07/29/2017 at 7:57 AM EDT Please let the patient know his stress test was negative. His echo showed moderate narrowing of one of his heart valves. It's unlikely that is causing his symptoms. Keep f/u with Dr Fletcher Anon as scheduled.

## 2017-08-02 ENCOUNTER — Encounter: Payer: Self-pay | Admitting: Cardiovascular Disease

## 2017-08-02 ENCOUNTER — Ambulatory Visit: Payer: Medicare Other | Admitting: Cardiovascular Disease

## 2017-08-02 VITALS — BP 160/68 | HR 73 | Ht 67.0 in | Wt 181.6 lb

## 2017-08-02 DIAGNOSIS — I2510 Atherosclerotic heart disease of native coronary artery without angina pectoris: Secondary | ICD-10-CM | POA: Diagnosis not present

## 2017-08-02 DIAGNOSIS — I10 Essential (primary) hypertension: Secondary | ICD-10-CM

## 2017-08-02 DIAGNOSIS — E782 Mixed hyperlipidemia: Secondary | ICD-10-CM

## 2017-08-02 DIAGNOSIS — I350 Nonrheumatic aortic (valve) stenosis: Secondary | ICD-10-CM | POA: Diagnosis not present

## 2017-08-02 DIAGNOSIS — I739 Peripheral vascular disease, unspecified: Secondary | ICD-10-CM

## 2017-08-02 MED ORDER — ATORVASTATIN CALCIUM 10 MG PO TABS: 10.0000 mg | ORAL_TABLET | ORAL | Status: DC

## 2017-08-02 MED ORDER — ATORVASTATIN CALCIUM 10 MG PO TABS: 10.0000 mg | ORAL_TABLET | Freq: Every day | ORAL | 1 refills | Status: DC

## 2017-08-02 NOTE — Progress Notes (Signed)
Cardiology Office Note   Date:  08/02/2017   ID:  Marcus Norton, DOB 05-Jun-1940, MRN [id number]  PCP:  [name], PA-C  Cardiologist: Dr. Fletcher Anon  Chief Complaint  Patient presents with  . Shortness of Breath    occasionally.      History of Present Illness: Marcus Norton is a 77 y.o. male who presents for a follow-up visit regarding claudication and PAD. He has known history of  paroxysmal supraventricular tachycardia, nonobstructive coronary artery disease, type 2 diabetes since 1990, hypertension and hyperlipidemia. He has known history of claudication . He did not tolerate cilostazol due to palpitations. He is s/p bilateral common iliac artery kissing stent placement in 11/2015. 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   He had abnormal nuclear stress test last year.  Cardiac catheterization in June showed severe ostial RCA stenosis and occluded RPL2 with nonobstructive disease affecting the left system.  He underwent successful angioplasty and drug-eluting stent placement to the ostial right coronary artery.  He was seen recently for worsening exertional dyspnea and fatigue.  He underwent a nuclear stress test which showed no evidence of ischemia with normal ejection fraction.  Echocardiogram showed normal EF with moderate aortic stenosis.  He reports stable symptoms overall.  He has occasional palpitations. He has very mild calf discomfort with walking equal in both sides.  Past Medical History:  Diagnosis Date  . 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 Arthritis    "lower back" (07/29/2016)  . ASCVD (arteriosclerotic cardiovascular disease)    -luminal irregularities in 1998 and 2005; normal EF  . Benign prostatic hypertrophy    status post transurethral resection of the prostate  . CAD (coronary artery disease)    a. Cath 07/29/16  99% ostial RCA & 70% pRCA s/p cutting balloon angioplasty and single SYNERGY DES 2.5X28. 100% very Distal 1st RPLB lesion --> the occlusion site moved further distal with guidewire advancement;  40% ostial LAD; 50% Ost Cx to Prox Cx lesion.  . Coronary artery disease   . Dysphagia   . Erectile dysfunction   . GERD (gastroesophageal reflux disease)   . Heart murmur   . History of hiatal hernia   . History of kidney stones   . Hyperlipidemia   . Hypertension   . Seasonal allergies   . Tobacco abuse    20 pack years; discontinued 1995  . Type II diabetes mellitus ([place])     Past Surgical History:  Procedure Laterality Date  . APPENDECTOMY    . CATARACT EXTRACTION W/ INTRAOCULAR LENS  IMPLANT, BILATERAL Bilateral   . COLONOSCOPY  07/20/2011   Dr. Gala Romney: multiple hyperplastic polyps. Surveillance 2018  . COLONOSCOPY N/A 06/30/2016   Procedure: COLONOSCOPY;  Surgeon: Daneil Dolin, MD;  Location: [facility];  Service: Endoscopy;  Laterality: N/A;  10:00am  . COLONOSCOPY W/ BIOPSIES AND POLYPECTOMY  07/08/08, 06/2011   Dr Madolyn Frieze papilla, diminutive rectal polyp ablated the, left-sided diverticula, piecemeal polypectomy of hepatic flexure adenomatous polyp, diminutive polyps near hepatic flexure ablated  . CORONARY ANGIOPLASTY WITH STENT PLACEMENT  07/29/2016  . CORONARY STENT INTERVENTION N/A 07/29/2016   Procedure: Coronary Stent Intervention;  Surgeon: Leonie Man, MD;  Location: [facility];  Service: Cardiovascular;  Laterality: N/A;  RCA  . ESOPHAGOGASTRODUODENOSCOPY  2013   erosive reflux esophagitis, s/p empiric dilation.  chronic duodenitis.   [name] LEFT HEART CATH AND CORONARY ANGIOGRAPHY N/A 07/29/2016   Procedure: Left Heart Cath and Coronary Angiography;  Surgeon: Leonie Man, MD;  Location: [facility];  Service: Cardiovascular;  Laterality: N/A;  . NASAL SEPTUM SURGERY    . NASAL SINUS SURGERY     "cleaned out my sinus"  . PERIPHERAL VASCULAR  CATHETERIZATION N/A 11/26/2015   Procedure: Abdominal Aortogram w/Lower Extremity;  Surgeon: Wellington Hampshire, MD;  Location: [facility];  Service: Cardiovascular;  Laterality: N/A;  . TONSILLECTOMY    . TRANSURETHRAL RESECTION OF PROSTATE  2009     Current Outpatient Medications  Medication Sig Dispense Refill  . ACCU-CHEK AVIVA PLUS test strip TEST BLOOD SUGAR TWICE DAILY 200 each 0  . acetaminophen (TYLENOL) 325 MG tablet Take 325 mg by mouth 2 (two) times daily as needed for mild pain.    [name] alfuzosin (UROXATRAL) 10 MG 24 hr tablet Take 10 mg by mouth daily with breakfast.    . amLODipine (NORVASC) 5 MG tablet Take 1 tablet (5 mg total) by mouth daily. 90 tablet 3  . aspirin EC 81 MG tablet Take 81 mg by mouth at bedtime.     [name] atorvastatin (LIPITOR) 10 MG tablet Take 5 mg by mouth every other day.     . Cyanocobalamin (VITAMIN B 12 PO) Take 2,000 Units by mouth daily.     . diazepam (VALIUM) 5 MG tablet Take 1 tablet (5 mg total) by mouth at bedtime as needed. for sleep 15 tablet 0  . docusate sodium (COLACE) 100 MG capsule Take 100 mg by mouth as needed for mild constipation.     [name] glucose blood test strip Accu-Chek Aviva Plus test strips    . lansoprazole (PREVACID) 30 MG capsule Take 1 capsule (30 mg total) by mouth daily at 12 noon. 30 capsule 2  . LANTUS SOLOSTAR 100 UNIT/ML Solostar Pen Inject 18 Units into the skin daily at 10 pm. Increase by 2 units every 3 days 15 mL 11  . lisinopril (PRINIVIL,ZESTRIL) 20 MG tablet Take 1 tablet (20 mg total) by mouth daily. 90 tablet 3  . loratadine (CLARITIN) 10 MG tablet Take 10 mg by mouth daily as needed for allergies.     . Magnesium 200 MG TABS Take 1 tablet (200 mg total) by mouth daily. 30 each 6  . metFORMIN (GLUCOPHAGE) 1000 MG tablet TAKE 1 TABLET(1000 MG) BY MOUTH TWICE DAILY WITH A MEAL 180 tablet 0  . metoprolol tartrate (LOPRESSOR) 25 MG tablet Take 1 tablet (25 mg total) by mouth 2 (two) times daily. 180 tablet 3  .  polyethylene glycol (MIRALAX / GLYCOLAX) packet Take 17 g by mouth every other day.     . prasugrel (EFFIENT) 10 MG TABS tablet Take 1 tablet (10 mg total) by mouth daily. 90 tablet 3  . sitaGLIPtin (JANUVIA) 100 MG tablet Take 1 tablet (100 mg total) by mouth daily. 90 tablet 3  . tadalafil (CIALIS) 20 MG tablet Take 0.5-1 tablets (10-20 mg total) by mouth every other day as needed for erectile dysfunction. 5 tablet 1   No current facility-administered medications for this visit.     Allergies:   Plavix [clopidogrel bisulfate]; Oxycontin [oxycodone hcl]; Penicillins; Pioglitazone; Prednisone; and Simvastatin    Social History:  The patient  reports that he quit smoking about 24 years ago. His smoking use included cigarettes. He started smoking about 52 years ago. He has a 15.00  pack-year smoking history. He has never used smokeless tobacco. He reports that he does not drink alcohol or use drugs.   Family History:  The patient's family history includes Colon cancer (age of onset: 55) in his sister; Ovarian cancer in his mother.    ROS:  Please see the history of present illness.   Otherwise, review of systems are positive for none.   All other systems are reviewed and negative.    PHYSICAL EXAM: VS:  BP (!) 160/68   Pulse 73   Ht 5\' 7"  (1.702 m)   Wt 181 lb 9.6 oz (82.4 kg)   BMI 28.44 kg/m  , BMI Body mass index is 28.44 kg/m. GEN: Well nourished, well developed, in no acute distress  HEENT: normal  Neck: no JVD, carotid bruits, or masses Cardiac: RRR; no  rubs, or gallops,no edema . 2/6 crescendo decrescendo systolic murmur in the aortic area which is mid peaking Respiratory:  clear to auscultation bilaterally, normal work of breathing GI: soft, nontender, nondistended, + BS MS: no deformity or atrophy  Skin: warm and dry, no rash Neuro:  Strength and sensation are intact Psych: euthymic mood, full affect Vascular: Femoral pulse: Normal bilaterally.    EKG:  EKG is not  ordered today.   Recent Labs: 08/27/2016: Magnesium 1.5 03/08/2017: Hemoglobin 14.0; Platelets 246.0 07/14/2017: ALT 38 07/28/2017: BUN 21; Creatinine, Ser 0.69; Potassium 4.3; Sodium 139    Lipid Panel    Component Value Date/Time   CHOL 137 07/14/2017 1024   TRIG 75.0 07/14/2017 1024   TRIG 96 03/12/2008   HDL 38.70 (L) 07/14/2017 1024   CHOLHDL 4 07/14/2017 1024   VLDL 15.0 07/14/2017 1024   LDLCALC 83 07/14/2017 1024   LDLCALC 91 03/12/2008      Wt Readings from Last 3 Encounters:  08/02/17 181 lb 9.6 oz (82.4 kg)  07/27/17 179 lb (81.2 kg)  07/20/17 179 lb 3.2 oz (81.3 kg)       No flowsheet data found.    ASSESSMENT AND PLAN:  1.  Peripheral arterial disease:   Minimal claudication at the present time.  Recent noninvasive vascular testing showed patent iliac stents with stable ABI.   Continue medical therapy and repeat studies in 1 year.  2. Coronary artery disease involving native coronary arteries with other forms of angina: Recent nuclear stress test was normal.  Recommend continuing medical therapy.  He reports side effects with too many medications.  It has been more than 1 year since stent placement.  Thus, I elected to discontinue Prasugrel and continue aspirin indefinitely.  3. Hyperlipidemia: He takes atorvastatin 5 mg every other day and recent LDL was 83 which is not a target.  He reports myalgia with other statins.  I increased the dose to 10 mg daily.  Repeat labs in 2 months.  4. Essential hypertension: Usually reasonably controlled but elevated today.  He reports being nervous about the results of his testing.  Continue to monitor for now.  5.  Moderate aortic stenosis: Repeat echocardiogram in 1 year.    Disposition:   FU with me in 6 months  Signed,  [name], MD  08/02/2017 8:38 AM    [place]

## 2017-08-02 NOTE — Patient Instructions (Signed)
Medication Instructions: INCREASE the Atorvastatin to 10 mg daily STOP the Effient  If you need a refill on your cardiac medications before your next appointment, please call your pharmacy.   Labwork: Your provider would like for you to return in 2 months to have the following labs drawn: FASTING Liver and Lipid. You do not need an appointment for the lab. Once in our office lobby there is a podium where you can sign in and ring the doorbell to alert [place] that you are here. The lab is open from 8:00 am to 4:30 pm; closed for lunch from 12:45pm-1:45pm.  Follow-Up: Your physician wants you to follow-up in 6 months with Dr. Fletcher Anon. You will receive a reminder letter in the mail two months in advance. If you don't receive a letter, please call our office at [phone] to schedule this follow-up appointment.  Thank you for choosing [facility]!!

## 2017-08-09 ENCOUNTER — Other Ambulatory Visit: Payer: Self-pay | Admitting: Physician Assistant

## 2017-09-01 IMAGING — DX DG CHEST 2V
2 series · 2 of 2 positions shown · non-contrast
Comparison: November 30, 2015

CLINICAL DATA: Lower extremity weakness with abdominal pain.
Hypertension.

EXAM:
CHEST  2 VIEW

[chest pa]
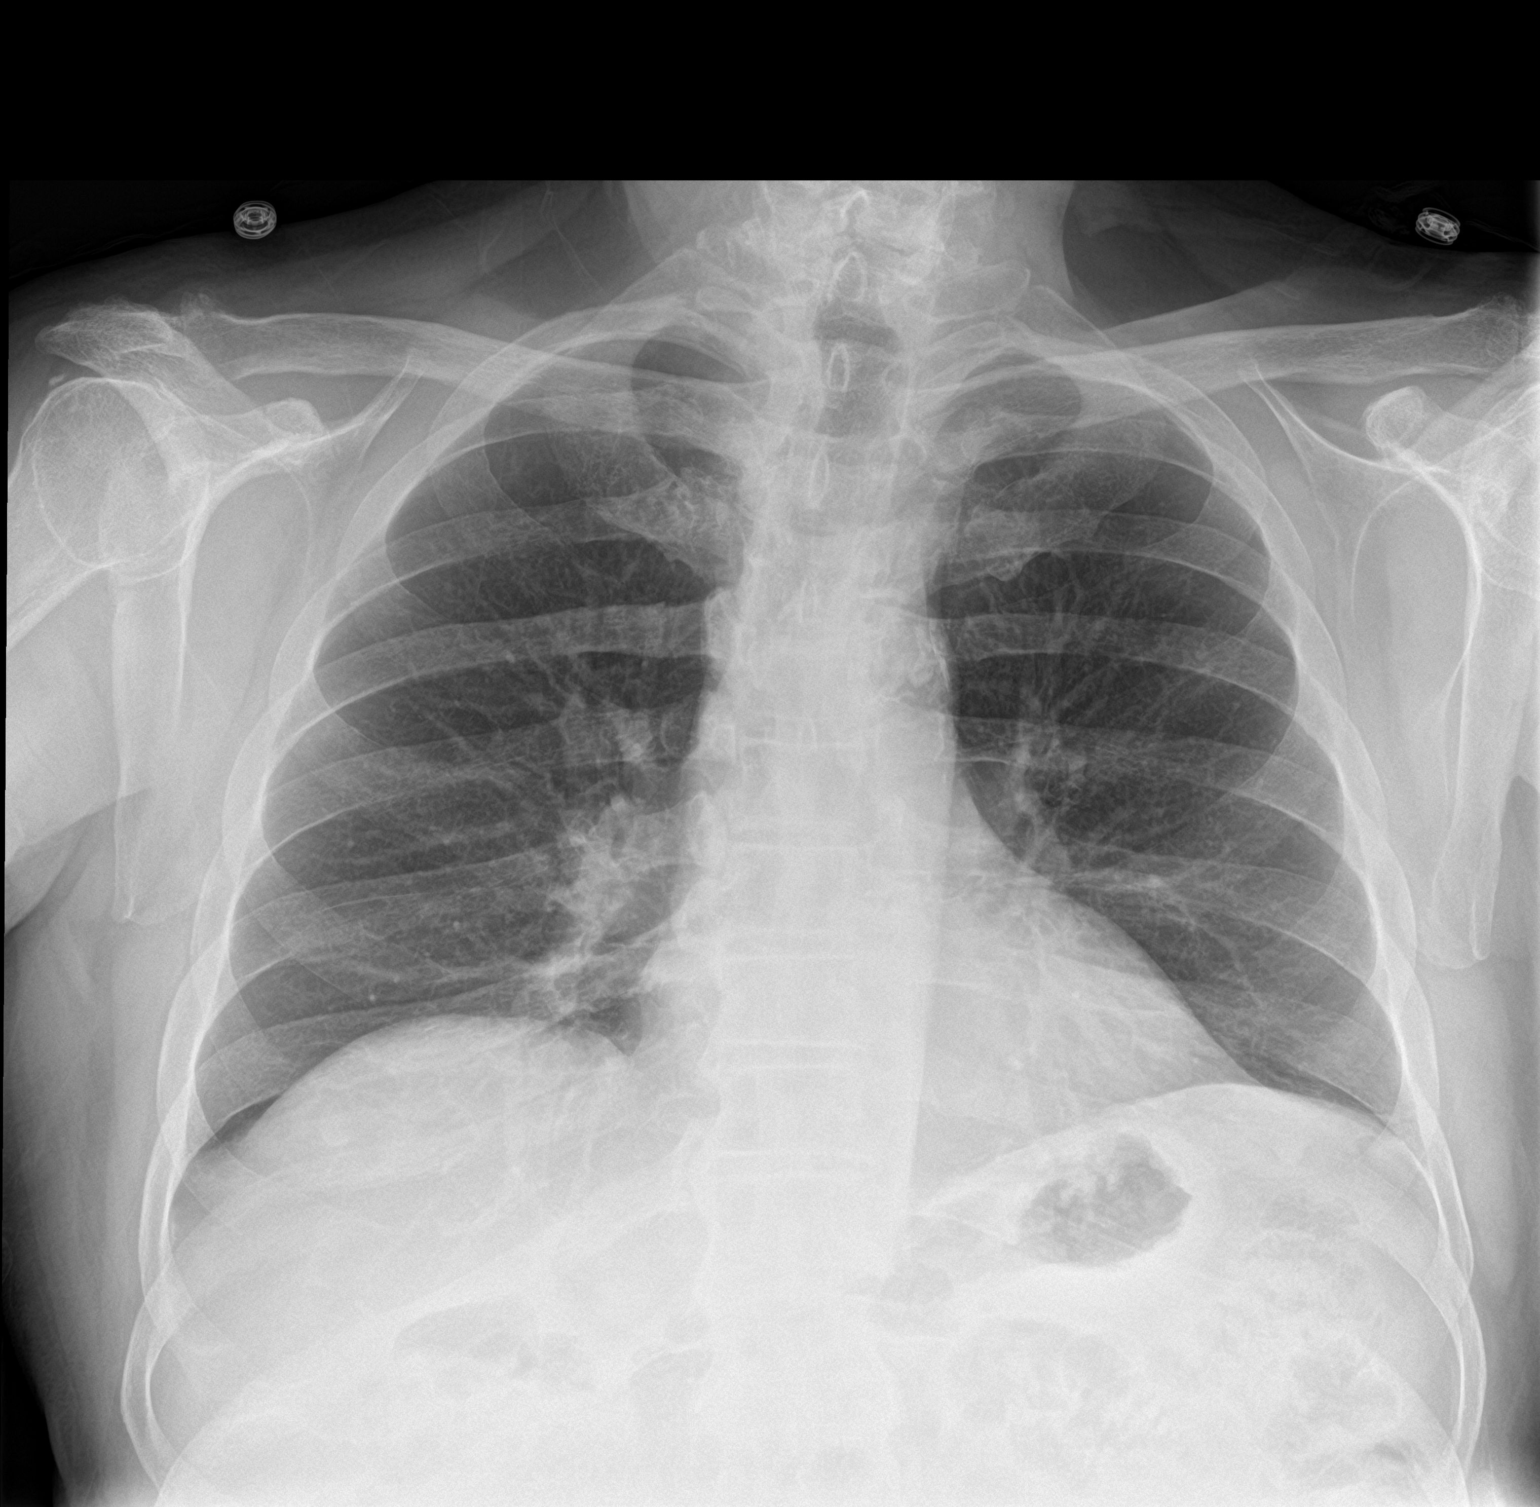

[chest lat]
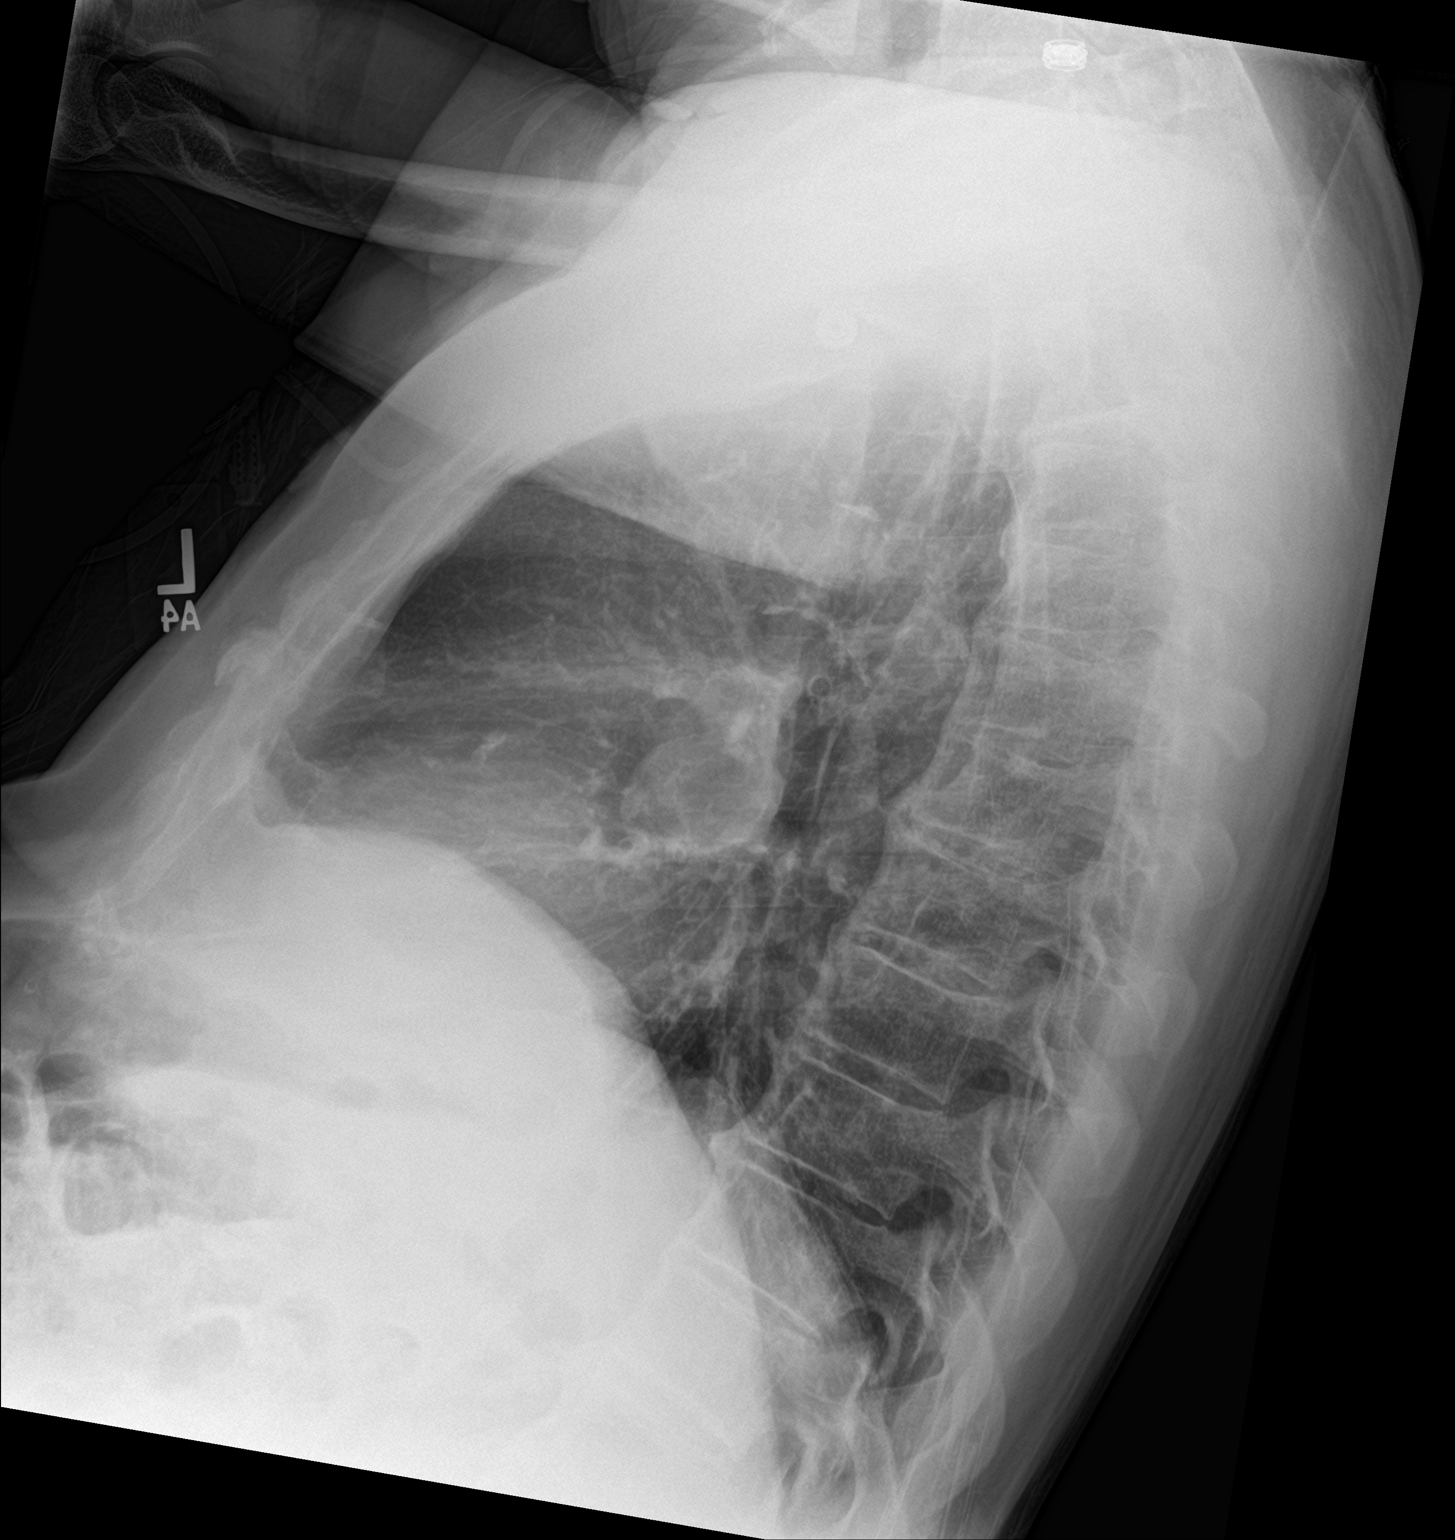

[2 of 2 positions shown; findings below may reference images not displayed]

FINDINGS: There are apparent nipple shadows bilaterally. There is no edema or
consolidation. The heart size and pulmonary vascularity are normal.
No adenopathy. There is atherosclerotic calcification in the aorta.
There is also calcification in the right carotid artery. There is
degenerative change in each shoulder. There is degenerative change
in the thoracic spine with diffuse idiopathic skeletal hyperostosis.
IMPRESSION: Apparent nipple shadows bilaterally. No edema or consolidation.
Aortic atherosclerosis. There is also calcification in the right
carotid artery.

## 2017-10-04 LAB — HEPATIC FUNCTION PANEL
ALBUMIN: 4.5 g/dL (ref 3.5–4.8)
ALT: 43 IU/L (ref 0–44)
AST: 29 IU/L (ref 0–40)
Alkaline Phosphatase: 48 IU/L (ref 39–117)
BILIRUBIN TOTAL: 0.4 mg/dL (ref 0.0–1.2)
BILIRUBIN, DIRECT: 0.13 mg/dL (ref 0.00–0.40)
TOTAL PROTEIN: 6.5 g/dL (ref 6.0–8.5)

## 2017-10-04 LAB — LIPID PANEL
CHOL/HDL RATIO: 3.6 ratio (ref 0.0–5.0)
Cholesterol, Total: 129 mg/dL (ref 100–199)
HDL: 36 mg/dL — ABNORMAL LOW (ref >39)
LDL CALC: 76 mg/dL (ref 0–99)
Triglycerides: 83 mg/dL (ref 0–149)
VLDL Cholesterol Cal: 17 mg/dL (ref 5–40)

## 2017-10-14 ENCOUNTER — Other Ambulatory Visit: Payer: Self-pay | Admitting: Physician Assistant

## 2017-10-14 DIAGNOSIS — K5900 Constipation, unspecified: Secondary | ICD-10-CM

## 2017-10-14 DIAGNOSIS — K219 Gastro-esophageal reflux disease without esophagitis: Secondary | ICD-10-CM

## 2017-10-17 ENCOUNTER — Telehealth: Payer: Self-pay | Admitting: Cardiovascular Disease

## 2017-10-17 NOTE — Telephone Encounter (Signed)
New Message   Pt is returning call for nurse about labs. Pt is requesting to be contacted after lunch due to having a doctors appt this morning

## 2017-10-17 NOTE — Telephone Encounter (Signed)
Left a message to call back  Notes recorded by Wellington Hampshire, MD on 10/14/2017 at 4:18 PM EDT Inform patient that labs were normal. Cholesterol improved. Continue atorvastatin.

## 2017-10-17 NOTE — Telephone Encounter (Signed)
Patient made aware of results and verbalized understanding.  

## 2017-11-09 ENCOUNTER — Other Ambulatory Visit: Payer: Self-pay | Admitting: Physician Assistant

## 2017-11-17 ENCOUNTER — Encounter: Payer: Self-pay | Admitting: Physician Assistant

## 2017-11-17 ENCOUNTER — Ambulatory Visit: Payer: Medicare Other | Admitting: Physician Assistant

## 2017-11-17 ENCOUNTER — Other Ambulatory Visit: Payer: Self-pay

## 2017-11-17 ENCOUNTER — Other Ambulatory Visit: Payer: Self-pay | Admitting: Cardiovascular Disease

## 2017-11-17 VITALS — BP 108/58 | HR 62 | Temp 97.5°F | Resp 14 | Ht 66.0 in | Wt 179.0 lb

## 2017-11-17 DIAGNOSIS — J3089 Other allergic rhinitis: Secondary | ICD-10-CM

## 2017-11-17 DIAGNOSIS — E119 Type 2 diabetes mellitus without complications: Secondary | ICD-10-CM | POA: Diagnosis not present

## 2017-11-17 DIAGNOSIS — G4759 Other parasomnia: Secondary | ICD-10-CM

## 2017-11-17 DIAGNOSIS — Z23 Encounter for immunization: Secondary | ICD-10-CM | POA: Diagnosis not present

## 2017-11-17 DIAGNOSIS — E782 Mixed hyperlipidemia: Secondary | ICD-10-CM

## 2017-11-17 DIAGNOSIS — K219 Gastro-esophageal reflux disease without esophagitis: Secondary | ICD-10-CM | POA: Diagnosis not present

## 2017-11-17 DIAGNOSIS — I10 Essential (primary) hypertension: Secondary | ICD-10-CM

## 2017-11-17 LAB — COMPREHENSIVE METABOLIC PANEL
ALT: 35 U/L (ref 0–53)
AST: 17 U/L (ref 0–37)
Albumin: 4.4 g/dL (ref 3.5–5.2)
Alkaline Phosphatase: 45 U/L (ref 39–117)
BUN: 23 mg/dL (ref 6–23)
CHLORIDE: 99 meq/L (ref 96–112)
CO2: 30 meq/L (ref 19–32)
CREATININE: 0.78 mg/dL (ref 0.40–1.50)
Calcium: 9.2 mg/dL (ref 8.4–10.5)
GFR: 102.13 mL/min (ref >60.00)
Glucose, Bld: 200 mg/dL — ABNORMAL HIGH (ref 70–99)
POTASSIUM: 4.6 meq/L (ref 3.5–5.1)
Sodium: 137 mEq/L (ref 135–145)
Total Bilirubin: 0.7 mg/dL (ref 0.2–1.2)
Total Protein: 6.8 g/dL (ref 6.0–8.3)

## 2017-11-17 LAB — LIPID PANEL
CHOL/HDL RATIO: 3
Cholesterol: 122 mg/dL (ref 0–200)
HDL: 35 mg/dL — ABNORMAL LOW (ref >39.00)
LDL CALC: 73 mg/dL (ref 0–99)
NonHDL: 86.5
Triglycerides: 70 mg/dL (ref 0.0–149.0)
VLDL: 14 mg/dL (ref 0.0–40.0)

## 2017-11-17 LAB — HEMOGLOBIN A1C: Hgb A1c MFr Bld: 8.3 % — ABNORMAL HIGH (ref 4.6–6.5)

## 2017-11-17 MED ORDER — PANTOPRAZOLE SODIUM 40 MG PO TBEC: 40.0000 mg | DELAYED_RELEASE_TABLET | Freq: Every day | ORAL | 3 refills | Status: DC

## 2017-11-17 MED ORDER — FLUTICASONE PROPIONATE 50 MCG/ACT NA SUSP: 2.0000 | Freq: Every day | NASAL | 6 refills | Status: DC

## 2017-11-17 NOTE — Progress Notes (Signed)
Patient presents to clinic today for follow-up of chronic medical issues.   Patent with history of DM II, unctonrolled. Patient is currently on a regimen of Metformin 1000 mg BID and Januvia 100 mg QD. Endorses taking medications as directed. Denies any change in vision, urinary habits. Noted some higher glucose levels but has been getting a few steroid shots from orthopedics. . Fasting sugars averaging 180-200 recently after steroid shots. Endorses taking other medications as directed. Patient denies chest pain, palpitations, lightheadedness, dizziness, vision changes or frequent headaches.  Patient endorses he has been sleep walking a few times per week over the past several months. Is also eating during this time without recalling this issue. Denies known prior history of sleep walking. States he does feel that sleep is non-restorative.  Past Medical History:  Diagnosis Date  . 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 Arthritis    "lower back" (07/29/2016)  . ASCVD (arteriosclerotic cardiovascular disease)    -luminal irregularities in 1998 and 2005; normal EF  . Benign prostatic hypertrophy    status post transurethral resection of the prostate  . CAD (coronary artery disease)    a. Cath 07/29/16 99% ostial RCA & 70% pRCA s/p cutting balloon angioplasty and single SYNERGY DES 2.5X28. 100% very Distal 1st RPLB lesion --> the occlusion site moved further distal with guidewire advancement;  40% ostial LAD; 50% Ost Cx to Prox Cx lesion.  . Coronary artery disease   . Dysphagia   . Erectile dysfunction   . GERD (gastroesophageal reflux disease)   . Heart murmur   . History of hiatal hernia   . History of kidney stones   . Hyperlipidemia   . Hypertension   . Seasonal allergies   . Tobacco abuse    20 pack years; discontinued 1995  . Type II diabetes mellitus ([place])     Current Outpatient Medications on File Prior to Visit  Medication Sig Dispense Refill  . ACCU-CHEK AVIVA PLUS test strip  TEST BLOOD SUGAR TWICE DAILY 200 each 0  . acetaminophen (TYLENOL) 325 MG tablet Take 325 mg by mouth 2 (two) times daily as needed for mild pain.    [name] alfuzosin (UROXATRAL) 10 MG 24 hr tablet Take 10 mg by mouth daily with breakfast.    . amLODipine (NORVASC) 5 MG tablet Take 1 tablet (5 mg total) by mouth daily. 90 tablet 3  . aspirin EC 81 MG tablet Take 81 mg by mouth at bedtime.     [name] atorvastatin (LIPITOR) 10 MG tablet Take 1 tablet (10 mg total) by mouth daily. 90 tablet 1  . Cyanocobalamin (VITAMIN B 12 PO) Take 2,000 Units by mouth daily.     . diazepam (VALIUM) 5 MG tablet Take 1 tablet (5 mg total) by mouth at bedtime as needed. for sleep 15 tablet 0  . docusate sodium (COLACE) 100 MG capsule Take 100 mg by mouth as needed for mild constipation.     [name] glucose blood test strip Accu-Chek Aviva Plus test strips    . lansoprazole (PREVACID) 30 MG capsule TAKE ONE CAPSULE BY MOUTH DAILY AT 12 NOON 90 capsule 1  . LANTUS SOLOSTAR 100 UNIT/ML Solostar Pen Inject 18 Units into the skin daily at 10 pm. Increase by 2 units every 3 days 15 mL 11  . lisinopril (PRINIVIL,ZESTRIL) 20 MG tablet Take 1 tablet (20 mg total) by mouth daily. 90 tablet 3  . loratadine (CLARITIN) 10 MG tablet Take 10 mg by mouth daily as needed  for allergies.     . Magnesium 200 MG TABS Take 1 tablet (200 mg total) by mouth daily. 30 each 6  . metFORMIN (GLUCOPHAGE) 1000 MG tablet TAKE 1 TABLET(1000 MG) BY MOUTH TWICE DAILY WITH A MEAL 180 tablet 0  . metoprolol tartrate (LOPRESSOR) 25 MG tablet Take 1 tablet (25 mg total) by mouth 2 (two) times daily. 180 tablet 3  . polyethylene glycol (MIRALAX / GLYCOLAX) packet Take 17 g by mouth every other day.     . sitaGLIPtin (JANUVIA) 100 MG tablet Take 1 tablet (100 mg total) by mouth daily. 90 tablet 3  . tadalafil (CIALIS) 20 MG tablet Take 0.5-1 tablets (10-20 mg total) by mouth every other day as needed for erectile dysfunction. 5 tablet 1   No current facility-administered  medications on file prior to visit.     Allergies  Allergen Reactions  . Plavix [Clopidogrel Bisulfate] Shortness Of Breath  . Oxycontin [Oxycodone Hcl] Other (See Comments)    Malaise  . Penicillins Other (See Comments)    [place] Has patient had a PCN reaction causing immediate rash, facial/tongue/throat swelling, SOB or lightheadedness with hypotension: No Has patient had a PCN reaction causing severe rash involving mucus membranes or skin necrosis: No Has patient had a PCN reaction that required hospitalization No Has patient had a PCN reaction occurring within the last 10 years: No If all of the above answers are "NO", then may proceed with Cephalosporin use.   . Pioglitazone Other (See Comments)    Made his chest hurt  . Prednisone Other (See Comments)    Reaction: muscle aches and soreness  . Simvastatin Other (See Comments)    Myalgias    Family History  Problem Relation Age of Onset  . Ovarian cancer Mother   . Colon cancer Sister 72       deceased from colon cancer    Social History   Socioeconomic History  . Marital status: Married    Spouse name: Not on file  . Number of children: 2  . Years of education: Not on file  . Highest education level: Not on file  Occupational History  . 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 Financial resource strain: Not on file  . Food insecurity:    Worry: Not on file    Inability: Not on file  . Transportation needs:    Medical: Not on file    Non-medical: Not on file  Tobacco Use  . Smoking status: Former Smoker    Packs/day: 1.00    Years: 15.00    Pack years: 15.00    Types: Cigarettes    Start date: 03/08/1965    Last attempt to quit: 07/07/1993    Years since quitting: 24.3  . Smokeless tobacco: Never Used  Substance and Sexual Activity  . Alcohol use: No    Alcohol/week: 0.0 standard drinks    Frequency: Never    Comment: 07/29/2016  "last drink was 4 wks ago; had a beer"  . Drug use: No  . Sexual activity: Not Currently  Lifestyle  . Physical activity:    Days per week: Not on file    Minutes per session: Not on file  . Stress: Not on file  Relationships  . Social connections:    Talks on phone: Not on file    Gets together: Not on file    Attends religious service: Not on file  Active member of club or organization: Not on file    Attends meetings of clubs or organizations: Not on file    Relationship status: Not on file  Other Topics Concern  . Not on file  Social History Narrative  . Not on file   Review of Systems - See HPI.  All other ROS are negative.  BP (!) 108/58   Pulse 62   Temp (!) 97.5 F (36.4 C) (Oral)   Resp 14   Ht 5\' 6"  (1.676 m)   Wt 179 lb (81.2 kg)   SpO2 97%   BMI 28.89 kg/m   Physical Exam  Constitutional: He is oriented to person, place, and time. He appears well-developed and well-nourished.  HENT:  Head: Normocephalic and atraumatic.  Right Ear: External ear normal.  Left Ear: External ear normal.  Eyes: Pupils are equal, round, and reactive to light. Conjunctivae are normal.  Neck: Neck supple.  Cardiovascular: Normal rate, regular rhythm, normal heart sounds and intact distal pulses.  Pulmonary/Chest: Effort normal and breath sounds normal.  Neurological: He is alert and oriented to person, place, and time.  Psychiatric: He has a normal mood and affect.  Vitals reviewed.  Recent Results (from the past 2160 hour(s))  Lipid panel     Status: Abnormal   Collection Time: 10/04/17 11:39 AM  Result Value Ref Range   Cholesterol, Total 129 100 - 199 mg/dL   Triglycerides 83 0 - 149 mg/dL   HDL 36 (L) >39 mg/dL   VLDL Cholesterol Cal 17 5 - 40 mg/dL   LDL Calculated 76 0 - 99 mg/dL   Chol/HDL Ratio 3.6 0.0 - 5.0 ratio    Comment:                                   T. Chol/HDL Ratio                                             Men  Women                                1/2 Avg.Risk  3.4    3.3                                   Avg.Risk  5.0    4.4                                2X Avg.Risk  9.6    7.1                                3X Avg.Risk 23.4   11.0   Hepatic function panel     Status: None   Collection Time: 10/04/17 11:39 AM  Result Value Ref Range   Total Protein 6.5 6.0 - 8.5 g/dL   Albumin 4.5 3.5 - 4.8 g/dL   Bilirubin Total 0.4 0.0 - 1.2 mg/dL   Bilirubin, Direct 0.13 0.00 - 0.40 mg/dL   Alkaline Phosphatase 48 39 - 117 IU/L   AST  29 0 - 40 IU/L   ALT 43 0 - 44 IU/L    Assessment/Plan: 1. Essential hypertension BP stable today. Discussed need to closely watch for lowering BP values. If decreasing, will need to alter dosing of medication.  2. Gastroesophageal reflux disease, esophagitis presence not specified Stable currently. PPI refilled.  - pantoprazole (PROTONIX) 40 MG tablet; Take 1 tablet (40 mg total) by mouth daily.  Dispense: 30 tablet; Refill: 3  3. Mixed hyperlipidemia Taking medications as directed. Dietary and exercise recommendations reviewed. Will obtain fasting labs today. - Lipid panel - Comprehensive metabolic panel  4. Diabetes mellitus without complication ([place]) Fasting labs today. Will alter regimen if indicated. HM up-to-date.  - Hemoglobin A1c - Lipid panel - Comprehensive metabolic panel  5. Other parasomnia With sleep walking. Referral to [facility] for further assessment.  - Ambulatory referral to Pulmonology  6. Non-seasonal allergic rhinitis, unspecified trigger Discussed supportive measures and OTC medications  7. Encounter for immunization High-dose flu given today. - Flu vaccine HIGH DOSE PF   Leeanne Rio, PA-C

## 2017-11-17 NOTE — Patient Instructions (Signed)
Please go to the lab today for blood work.  I will call you with your results. We will alter treatment regimen(s) if indicated by your results.   Stop the Prevacid (Lansoprazole) and start the Protonix daily. Stop the OTC nasal spray. Start the [facility] as directed.  Schedule follow-up with your specialists. Continue care as directed by them.  You will be contacted for assessment by a sleep medicine specialist.  Continue warm compresses on the eye to help the stye resolve. It is not infected. Please discuss at your visit with your eye doctor coming up.   Diabetes Mellitus and Exercise Exercising regularly is important for your overall health, especially when you have diabetes (diabetes mellitus). Exercising is not only about losing weight. It has many health benefits, such as increasing muscle strength and bone density and reducing body fat and stress. This leads to improved fitness, flexibility, and endurance, all of which result in better overall health. Exercise has additional benefits for people with diabetes, including:  Reducing appetite.  Helping to lower and control blood glucose.  Lowering blood pressure.  Helping to control amounts of fatty substances (lipids) in the blood, such as cholesterol and triglycerides.  Helping the body to respond better to insulin (improving insulin sensitivity).  Reducing how much insulin the body needs.  Decreasing the risk for heart disease by: ? Lowering cholesterol and triglyceride levels. ? Increasing the levels of good cholesterol. ? Lowering blood glucose levels.  What is my activity plan? Your health care provider or certified diabetes educator can help you make a plan for the type and frequency of exercise (activity plan) that works for you. Make sure that you:  Do at least 150 minutes of moderate-intensity or vigorous-intensity exercise each week. This could be brisk walking, biking, or water aerobics. ? Do stretching and  strength exercises, such as yoga or weightlifting, at least 2 times a week. ? Spread out your activity over at least 3 days of the week.  Get some form of physical activity every day. ? Do not go more than 2 days in a row without some kind of physical activity. ? Avoid being inactive for more than 90 minutes at a time. Take frequent breaks to walk or stretch.  Choose a type of exercise or activity that you enjoy, and set realistic goals.  Start slowly, and gradually increase the intensity of your exercise over time.  What do I need to know about managing my diabetes?  Check your blood glucose before and after exercising. ? If your blood glucose is higher than 240 mg/dL (13.3 mmol/L) before you exercise, check your urine for ketones. If you have ketones in your urine, do not exercise until your blood glucose returns to normal.  Know the symptoms of low blood glucose (hypoglycemia) and how to treat it. Your risk for hypoglycemia increases during and after exercise. Common symptoms of hypoglycemia can include: ? Hunger. ? Anxiety. ? Sweating and feeling clammy. ? Confusion. ? Dizziness or feeling light-headed. ? Increased heart rate or palpitations. ? Blurry vision. ? Tingling or numbness around the mouth, lips, or tongue. ? Tremors or shakes. ? Irritability.  Keep a rapid-acting carbohydrate snack available before, during, and after exercise to help prevent or treat hypoglycemia.  Avoid injecting insulin into areas of the body that are going to be exercised. For example, avoid injecting insulin into: ? The arms, when playing tennis. ? The legs, when jogging.  Keep records of your exercise habits. Doing this can help  you and your health care provider adjust your diabetes management plan as needed. Write down: ? Food that you eat before and after you exercise. ? Blood glucose levels before and after you exercise. ? The type and amount of exercise you have done. ? When your insulin  is expected to peak, if you use insulin. Avoid exercising at times when your insulin is peaking.  When you start a new exercise or activity, work with your health care provider to make sure the activity is safe for you, and to adjust your insulin, medicines, or food intake as needed.  Drink plenty of water while you exercise to prevent dehydration or heat stroke. Drink enough fluid to keep your urine clear or pale yellow. This information is not intended to replace advice given to you by your health care provider. Make sure you discuss any questions you have with your health care provider. Document Released: 05/01/2003 Document Revised: 08/29/2015 Document Reviewed: 07/21/2015 Elsevier Interactive Patient Education  2018 [employer].

## 2017-11-21 ENCOUNTER — Other Ambulatory Visit: Payer: Self-pay | Admitting: Physician Assistant

## 2017-11-21 DIAGNOSIS — E119 Type 2 diabetes mellitus without complications: Secondary | ICD-10-CM

## 2017-11-21 MED ORDER — GLIMEPIRIDE 2 MG PO TABS: 2.0000 mg | ORAL_TABLET | Freq: Every day | ORAL | 1 refills | Status: DC

## 2017-11-22 ENCOUNTER — Other Ambulatory Visit: Payer: Self-pay | Admitting: Physician Assistant

## 2017-11-22 DIAGNOSIS — E119 Type 2 diabetes mellitus without complications: Secondary | ICD-10-CM

## 2017-11-22 MED ORDER — INSULIN PEN NEEDLE 31G X 8 MM MISC: 3 refills | Status: DC

## 2017-11-29 ENCOUNTER — Other Ambulatory Visit: Payer: Self-pay | Admitting: Emergency Medicine

## 2017-11-29 MED ORDER — DIAZEPAM 5 MG PO TABS: 5.0000 mg | ORAL_TABLET | Freq: Every evening | ORAL | 0 refills | Status: DC | PRN

## 2017-11-29 NOTE — Telephone Encounter (Signed)
Patient states he is out of his Diazepam to help with sleep.

## 2017-11-30 ENCOUNTER — Other Ambulatory Visit: Payer: Self-pay | Admitting: Physician Assistant

## 2017-12-20 ENCOUNTER — Ambulatory Visit: Payer: Medicare Other | Admitting: Pulmonary Disease

## 2017-12-20 ENCOUNTER — Encounter: Payer: Self-pay | Admitting: Physician Assistant

## 2017-12-20 ENCOUNTER — Ambulatory Visit: Payer: Medicare Other | Admitting: Physician Assistant

## 2017-12-20 ENCOUNTER — Other Ambulatory Visit: Payer: Self-pay

## 2017-12-20 ENCOUNTER — Encounter: Payer: Self-pay | Admitting: Pulmonary Disease

## 2017-12-20 VITALS — BP 110/62 | HR 76 | Ht 67.0 in | Wt 179.0 lb

## 2017-12-20 VITALS — BP 116/62 | HR 65 | Temp 97.5°F | Resp 14 | Ht 66.0 in | Wt 179.0 lb

## 2017-12-20 DIAGNOSIS — R05 Cough: Secondary | ICD-10-CM | POA: Diagnosis not present

## 2017-12-20 DIAGNOSIS — R0683 Snoring: Secondary | ICD-10-CM

## 2017-12-20 DIAGNOSIS — R053 Chronic cough: Secondary | ICD-10-CM | POA: Insufficient documentation

## 2017-12-20 DIAGNOSIS — J302 Other seasonal allergic rhinitis: Secondary | ICD-10-CM

## 2017-12-20 DIAGNOSIS — Z794 Long term (current) use of insulin: Secondary | ICD-10-CM

## 2017-12-20 DIAGNOSIS — E118 Type 2 diabetes mellitus with unspecified complications: Secondary | ICD-10-CM

## 2017-12-20 DIAGNOSIS — IMO0001 Reserved for inherently not codable concepts without codable children: Secondary | ICD-10-CM

## 2017-12-20 LAB — BASIC METABOLIC PANEL
BUN: 26 mg/dL — AB (ref 6–23)
CALCIUM: 9.8 mg/dL (ref 8.4–10.5)
CO2: 31 mEq/L (ref 19–32)
Chloride: 98 mEq/L (ref 96–112)
Creatinine, Ser: 0.84 mg/dL (ref 0.40–1.50)
GFR: 93.74 mL/min (ref >60.00)
GLUCOSE: 161 mg/dL — AB (ref 70–99)
POTASSIUM: 5 meq/L (ref 3.5–5.1)
Sodium: 136 mEq/L (ref 135–145)

## 2017-12-20 MED ORDER — AZELASTINE HCL 0.1 % NA SOLN: 2.0000 | Freq: Two times a day (BID) | NASAL | 12 refills | Status: DC

## 2017-12-20 MED ORDER — AMLODIPINE BESYLATE 5 MG PO TABS: 5.0000 mg | ORAL_TABLET | Freq: Every day | ORAL | 1 refills | Status: DC

## 2017-12-20 NOTE — Progress Notes (Signed)
History of Present Illness: Patient is a 77 y.o. male who presents to clinic today for follow-up of Diabetes Mellitus II, uncontrolled without complication.  Patient currently on medication regimen of Metformin 1000 mg BID, Januvia 100 mg QD, Lantus 20 units daily.  Endorses taking medications as directed. Endorses noting significant crawling sensation of skin with the glimiperide so he stopped medication.  Denies any more steroid shots since last A1C check. Is checking blood glucose as directed. Has handout of results for review.   Latest Maintenance: A1C --  Lab Results  Component Value Date   HGBA1C 8.3 (H) 11/17/2017   Diabetic Eye Exam -- up-to-date Urine Microalbumin -- up-to-date Foot Exam -- up-to-date  Patient also noting ongoing sputum production over the past 1-2 years, noting clear or whitsh production. Denies SOB, hemoptysis or fatigue. Denies chest tightness or wheezing.   Past Medical History:  Diagnosis Date  . 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 Arthritis    "lower back" (07/29/2016)  . ASCVD (arteriosclerotic cardiovascular disease)    -luminal irregularities in 1998 and 2005; normal EF  . Benign prostatic hypertrophy    status post transurethral resection of the prostate  . CAD (coronary artery disease)    a. Cath 07/29/16 99% ostial RCA & 70% pRCA s/p cutting balloon angioplasty and single SYNERGY DES 2.5X28. 100% very Distal 1st RPLB lesion --> the occlusion site moved further distal with guidewire advancement;  40% ostial LAD; 50% Ost Cx to Prox Cx lesion.  . Coronary artery disease   . Dysphagia   . Erectile dysfunction   . GERD (gastroesophageal reflux disease)   . Heart murmur   . History of hiatal hernia   . History of kidney stones   . Hyperlipidemia   . Hypertension   . Seasonal allergies   . Tobacco abuse    20 pack years; discontinued 1995  . Type II diabetes mellitus ([place])     Current Outpatient Medications on File Prior to Visit  Medication  Sig Dispense Refill  . ACCU-CHEK AVIVA PLUS test strip USE TO TEST BLOOD SUAGR TWICE DAILY 200 each 2  . acetaminophen (TYLENOL) 325 MG tablet Take 325 mg by mouth 2 (two) times daily as needed for mild pain.    [name] alfuzosin (UROXATRAL) 10 MG 24 hr tablet Take 10 mg by mouth daily with breakfast.    . aspirin EC 81 MG tablet Take 81 mg by mouth at bedtime.     [name] atorvastatin (LIPITOR) 10 MG tablet Take 1 tablet (10 mg total) by mouth daily. 90 tablet 1  . Cyanocobalamin (VITAMIN B 12 PO) Take 2,000 Units by mouth daily.     [name] docusate sodium (COLACE) 100 MG capsule Take 100 mg by mouth as needed for mild constipation.     . fluticasone (FLONASE) 50 MCG/ACT nasal spray Place 2 sprays into both nostrils daily. 16 g 6  . glucose blood test strip Accu-Chek Aviva Plus test strips    . Insulin Pen Needle (B-D ULTRAFINE III SHORT PEN) 31G X 8 MM MISC Inject insulin SQ daily Dx:E11.8 100 each 3  . lansoprazole (PREVACID) 30 MG capsule TAKE ONE CAPSULE BY MOUTH DAILY AT 12 NOON 90 capsule 1  . LANTUS SOLOSTAR 100 UNIT/ML Solostar Pen Inject 18 Units into the skin daily at 10 pm. Increase by 2 units every 3 days 15 mL 11  . lisinopril (PRINIVIL,ZESTRIL) 20 MG tablet TAKE 1 TABLET BY MOUTH DAILY 90 tablet 1  . loratadine (CLARITIN)  10 MG tablet Take 10 mg by mouth daily as needed for allergies.     . Magnesium 200 MG TABS Take 1 tablet (200 mg total) by mouth daily. 30 each 6  . metFORMIN (GLUCOPHAGE) 1000 MG tablet TAKE 1 TABLET(1000 MG) BY MOUTH TWICE DAILY WITH A MEAL 180 tablet 0  . metoprolol tartrate (LOPRESSOR) 25 MG tablet TAKE 1 TABLET(25 MG) BY MOUTH TWICE DAILY 180 tablet 1  . pantoprazole (PROTONIX) 40 MG tablet Take 1 tablet (40 mg total) by mouth daily. 30 tablet 3  . polyethylene glycol (MIRALAX / GLYCOLAX) packet Take 17 g by mouth every other day.     . sitaGLIPtin (JANUVIA) 100 MG tablet Take 1 tablet (100 mg total) by mouth daily. 90 tablet 3  . tadalafil (CIALIS) 20 MG tablet Take 0.5-1  tablets (10-20 mg total) by mouth every other day as needed for erectile dysfunction. 5 tablet 1  . diazepam (VALIUM) 5 MG tablet Take 1 tablet (5 mg total) by mouth at bedtime as needed. for sleep (Patient not taking: Reported on 12/20/2017) 15 tablet 0   No current facility-administered medications on file prior to visit.     Allergies  Allergen Reactions  . Plavix [Clopidogrel Bisulfate] Shortness Of Breath  . Oxycontin [Oxycodone Hcl] Other (See Comments)    Malaise  . Penicillins Other (See Comments)    [place] Has patient had a PCN reaction causing immediate rash, facial/tongue/throat swelling, SOB or lightheadedness with hypotension: No Has patient had a PCN reaction causing severe rash involving mucus membranes or skin necrosis: No Has patient had a PCN reaction that required hospitalization No Has patient had a PCN reaction occurring within the last 10 years: No If all of the above answers are "NO", then may proceed with Cephalosporin use.   . Pioglitazone Other (See Comments)    Made his chest hurt  . Prednisone Other (See Comments)    Reaction: muscle aches and soreness  . Simvastatin Other (See Comments)    Myalgias    Family History  Problem Relation Age of Onset  . Ovarian cancer Mother   . Colon cancer Sister 72       deceased from colon cancer    Social History   Socioeconomic History  . Marital status: Married    Spouse name: Not on file  . Number of children: 2  . Years of education: Not on file  . Highest education level: Not on file  Occupational History  . Occupation: Dietitian, 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    Comment: Gaffer of agriculture  Social Needs  . Financial resource strain: Not on file  . Food insecurity:    Worry: Not on file    Inability: Not on file  . Transportation needs:    Medical: Not on file    Non-medical: Not on file  Tobacco Use  . Smoking status: Former Smoker    Packs/day: 1.00    Years: 15.00    Pack  years: 15.00    Types: Cigarettes    Start date: 03/08/1965    Last attempt to quit: 07/07/1993    Years since quitting: 24.4  . Smokeless tobacco: Never Used  Substance and Sexual Activity  . Alcohol use: No    Alcohol/week: 0.0 standard drinks    Frequency: Never    Comment: 07/29/2016 "last drink was 4 wks ago; had a beer"  . Drug use: No  . Sexual activity: Not Currently  Lifestyle  . Physical activity:  Days per week: Not on file    Minutes per session: Not on file  . Stress: Not on file  Relationships  . Social connections:    Talks on phone: Not on file    Gets together: Not on file    Attends religious service: Not on file    Active member of club or organization: Not on file    Attends meetings of clubs or organizations: Not on file    Relationship status: Not on file  Other Topics Concern  . Not on file  Social History Narrative  . Not on file   Review of Systems: Pertinent ROS are listed in HPI  Physical Examination: BP 116/62   Pulse 65   Temp (!) 97.5 F (36.4 C) (Oral)   Resp 14   Ht 5\' 6"  (1.676 m)   Wt 179 lb (81.2 kg)   SpO2 97%   BMI 28.89 kg/m  General appearance: alert, cooperative, appears stated age and no distress Lungs: clear to auscultation bilaterally Heart: regular rate and rhythm, S1, S2 normal, no murmur, click, rub or gallop Extremities: extremities normal, atraumatic, no cyanosis or edema Pulses: 2+ and symmetric Neurologic: Alert and oriented X 3, normal strength and tone. Normal symmetric reflexes. Normal coordination and gait  Assessment/Plan: Insulin dependent diabetes mellitus with complications (HCC) Fasting glucose levels are improved compared to last check even despite not taking the Amaryl. Will hold off on adding medications currently since he is no longer getting steroid shots. Will check fructosamine level today to further assess.  Chronic cough Referral to Pulm placed for further assessment and PFT.

## 2017-12-20 NOTE — Progress Notes (Signed)
Marcus Norton    [id number]    08/18/1940  Primary Care Physician:Martin, Luanna Cole, PA-C  Referring Physician: [name], PA-C [address] [place], [place] [address]  Chief complaint:   History of snoring Multiple awakenings during the night   HPI:  Wakes up multiple times during the night to use the bathroom, about 3-4 times a night Usually goes to bed between 1030 and 11 PM Able to fall asleep easily Will usually wake up in about an hour and 1/2 to 2 hours Usually not able to sleep for more than 2 hours straight tries to get up in the morning between 7 and 8 AM Admits to a dry mouth in the mornings No morning headaches No night sweats Some memory loss, fair ability to concentrate  History of prostate issues for which he had a TURP in the past  He has used sleep aids in the past including diazepam at a low dose  ESS of 16  Occupation: Was a Community education officer for many years, farmed for many years Exposures: No significant exposures Smoking history: Reformed smoker  Outpatient Encounter Medications as of 12/20/2017  Medication Sig  . ACCU-CHEK AVIVA PLUS test strip USE TO TEST BLOOD SUAGR TWICE DAILY  . acetaminophen (TYLENOL) 325 MG tablet Take 325 mg by mouth 2 (two) times daily as needed for mild pain.  [name] alfuzosin (UROXATRAL) 10 MG 24 hr tablet Take 10 mg by mouth daily with breakfast.  . amLODipine (NORVASC) 5 MG tablet Take 1 tablet (5 mg total) by mouth daily.  [name] aspirin EC 81 MG tablet Take 81 mg by mouth at bedtime.   [name] atorvastatin (LIPITOR) 10 MG tablet Take 1 tablet (10 mg total) by mouth daily.  [name] azelastine (ASTELIN) 0.1 % nasal spray Place 2 sprays into both nostrils 2 (two) times daily. Use in each nostril as directed  . Cyanocobalamin (VITAMIN B 12 PO) Take 2,000 Units by mouth daily.   . diazepam (VALIUM) 5 MG tablet Take 1 tablet (5 mg total) by mouth at bedtime as needed. for sleep  . docusate sodium (COLACE) 100 MG capsule Take  100 mg by mouth as needed for mild constipation.   [name] glucose blood test strip Accu-Chek Aviva Plus test strips  . Insulin Pen Needle (B-D ULTRAFINE III SHORT PEN) 31G X 8 MM MISC Inject insulin SQ daily Dx:E11.8  . LANTUS SOLOSTAR 100 UNIT/ML Solostar Pen Inject 18 Units into the skin daily at 10 pm. Increase by 2 units every 3 days  . lisinopril (PRINIVIL,ZESTRIL) 20 MG tablet TAKE 1 TABLET BY MOUTH DAILY  . loratadine (CLARITIN) 10 MG tablet Take 10 mg by mouth daily as needed for allergies.   . Magnesium 200 MG TABS Take 1 tablet (200 mg total) by mouth daily.  . metFORMIN (GLUCOPHAGE) 1000 MG tablet TAKE 1 TABLET(1000 MG) BY MOUTH TWICE DAILY WITH A MEAL  . metoprolol tartrate (LOPRESSOR) 25 MG tablet TAKE 1 TABLET(25 MG) BY MOUTH TWICE DAILY  . pantoprazole (PROTONIX) 40 MG tablet Take 1 tablet (40 mg total) by mouth daily.  . polyethylene glycol (MIRALAX / GLYCOLAX) packet Take 17 g by mouth every other day.   . sitaGLIPtin (JANUVIA) 100 MG tablet Take 1 tablet (100 mg total) by mouth daily.  . tadalafil (CIALIS) 20 MG tablet Take 0.5-1 tablets (10-20 mg total) by mouth every other day as needed for erectile dysfunction.  . [DISCONTINUED] fluticasone (FLONASE) 50  MCG/ACT nasal spray Place 2 sprays into both nostrils daily.  . [DISCONTINUED] lansoprazole (PREVACID) 30 MG capsule TAKE ONE CAPSULE BY MOUTH DAILY AT 12 NOON   No facility-administered encounter medications on file as of 12/20/2017.     Allergies as of 12/20/2017 - Review Complete 12/20/2017  Allergen Reaction Noted  . Plavix [clopidogrel bisulfate] Shortness Of Breath 02/24/2016  . Oxycontin [oxycodone hcl] Other (See Comments) 05/14/2012  . Penicillins Other (See Comments) 04/30/2013  . Pioglitazone Other (See Comments) 04/30/2013  . Prednisone Other (See Comments) 07/14/2011  . Simvastatin Other (See Comments) 05/14/2012    Past Medical History:  Diagnosis Date  . 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 Arthritis     "lower back" (07/29/2016)  . ASCVD (arteriosclerotic cardiovascular disease)    -luminal irregularities in 1998 and 2005; normal EF  . Benign prostatic hypertrophy    status post transurethral resection of the prostate  . CAD (coronary artery disease)    a. Cath 07/29/16 99% ostial RCA & 70% pRCA s/p cutting balloon angioplasty and single SYNERGY DES 2.5X28. 100% very Distal 1st RPLB lesion --> the occlusion site moved further distal with guidewire advancement;  40% ostial LAD; 50% Ost Cx to Prox Cx lesion.  . Coronary artery disease   . Dysphagia   . Erectile dysfunction   . GERD (gastroesophageal reflux disease)   . Heart murmur   . History of hiatal hernia   . History of kidney stones   . Hyperlipidemia   . Hypertension   . Seasonal allergies   . Tobacco abuse    20 pack years; discontinued 1995  . Type II diabetes mellitus ([place])     Past Surgical History:  Procedure Laterality Date  . APPENDECTOMY    . CATARACT EXTRACTION W/ INTRAOCULAR LENS  IMPLANT, BILATERAL Bilateral   . COLONOSCOPY  07/20/2011   Dr. Gala Romney: multiple hyperplastic polyps. Surveillance 2018  . COLONOSCOPY N/A 06/30/2016   Procedure: COLONOSCOPY;  Surgeon: Daneil Dolin, MD;  Location: [facility];  Service: Endoscopy;  Laterality: N/A;  10:00am  . COLONOSCOPY W/ BIOPSIES AND POLYPECTOMY  07/08/08, 06/2011   Dr Madolyn Frieze papilla, diminutive rectal polyp ablated the, left-sided diverticula, piecemeal polypectomy of hepatic flexure adenomatous polyp, diminutive polyps near hepatic flexure ablated  . CORONARY ANGIOPLASTY WITH STENT PLACEMENT  07/29/2016  . CORONARY STENT INTERVENTION N/A 07/29/2016   Procedure: Coronary Stent Intervention;  Surgeon: Leonie Man, MD;  Location: [facility];  Service: Cardiovascular;  Laterality: N/A;  RCA  . ESOPHAGOGASTRODUODENOSCOPY  2013   erosive reflux esophagitis, s/p empiric dilation. chronic duodenitis.   [name] LEFT HEART CATH AND CORONARY ANGIOGRAPHY N/A 07/29/2016    Procedure: Left Heart Cath and Coronary Angiography;  Surgeon: Leonie Man, MD;  Location: [facility];  Service: Cardiovascular;  Laterality: N/A;  . NASAL SEPTUM SURGERY    . NASAL SINUS SURGERY     "cleaned out my sinus"  . PERIPHERAL VASCULAR CATHETERIZATION N/A 11/26/2015   Procedure: Abdominal Aortogram w/Lower Extremity;  Surgeon: Wellington Hampshire, MD;  Location: [facility];  Service: Cardiovascular;  Laterality: N/A;  . TONSILLECTOMY    . TRANSURETHRAL RESECTION OF PROSTATE  2009    Family History  Problem Relation Age of Onset  . Ovarian cancer Mother   . Colon cancer Sister 28       deceased from colon cancer    Social History   Socioeconomic History  . Marital status: Married  Spouse name: Not on file  . Number of children: 2  . Years of education: Not on file  . Highest education level: Not on file  Occupational History  . 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 Financial resource strain: Not on file  . Food insecurity:    Worry: Not on file    Inability: Not on file  . Transportation needs:    Medical: Not on file    Non-medical: Not on file  Tobacco Use  . Smoking status: Former Smoker    Packs/day: 1.00    Years: 15.00    Pack years: 15.00    Types: Cigarettes    Start date: 03/08/1965    Last attempt to quit: 07/07/1993    Years since quitting: 24.4  . Smokeless tobacco: Never Used  Substance and Sexual Activity  . Alcohol use: No    Alcohol/week: 0.0 standard drinks    Frequency: Never    Comment: 07/29/2016 "last drink was 4 wks ago; had a beer"  . Drug use: No  . Sexual activity: Not Currently  Lifestyle  . Physical activity:    Days per week: Not on file    Minutes per session: Not on file  . Stress: Not on file  Relationships  . Social connections:    Talks on phone: Not on file    Gets together: Not on file    Attends religious service: Not on file      Active member of club or organization: Not on file    Attends meetings of clubs or organizations: Not on file    Relationship status: Not on file  . Intimate partner violence:    Fear of current or ex partner: Not on file    Emotionally abused: Not on file    Physically abused: Not on file    Forced sexual activity: Not on file  Other Topics Concern  . Not on file  Social History Narrative  . Not on file    Review of Systems  Constitutional: Negative.   HENT:       Sinus problems  Respiratory: Positive for cough. Negative for shortness of breath.        Significant history of snoring  Cardiovascular: Negative.   Gastrointestinal: Negative.   Endocrine: Negative.   Genitourinary: Negative.     Vitals:   12/20/17 1450  BP: 110/62  Pulse: 76  SpO2: 97%     Physical Exam  Constitutional: He is oriented to person, place, and time. He appears well-developed and well-nourished. No distress.  HENT:  Head: Normocephalic and atraumatic.  Eyes: Pupils are equal, round, and reactive to light. Conjunctivae are normal. Right eye exhibits no discharge. Left eye exhibits no discharge.  Neck: Normal range of motion. Neck supple. No tracheal deviation present. No thyromegaly present.  Cardiovascular: Normal rate and regular rhythm.  Pulmonary/Chest: Effort normal and breath sounds normal. No respiratory distress. He has no wheezes.  Abdominal: Soft. Bowel sounds are normal. He exhibits no distension. There is no tenderness.  Neurological: He is alert and oriented to person, place, and time. No cranial nerve deficit.  Skin: Skin is warm and dry. He is not diaphoretic. No erythema.  Psychiatric: He has a normal mood and affect.   Data Reviewed: Records reviewed  Assessment:  History of snoring -May have primary snoring however does have significant daytime sleepiness associated  Multiple awakenings at night/nocturia -The awakenings may  be on account of sleep disordered  breathing -Associated nocturia because he is already awake versus nocturia been more common in patients with untreated sleep disordered breathing  Above may be on account of sleep disordered breathing  Moderate probability of significant sleep disordered breathing  Plan/Recommendations:  We will order home sleep study  Pathophysiology of sleep disordered breathing discussed with the patient  Treatment options of sleep disordered breathing discussed  I will see you back in the office in about 3 months, encouraged to call with any significant concerns   Sherrilyn Rist MD [facility] 12/20/2017, 3:15 PM  CC: [name], PA-C

## 2017-12-20 NOTE — Assessment & Plan Note (Signed)
Fasting glucose levels are improved compared to last check even despite not taking the Amaryl. Will hold off on adding medications currently since he is no longer getting steroid shots. Will check fructosamine level today to further assess.

## 2017-12-20 NOTE — Assessment & Plan Note (Signed)
Referral to Pulm placed for further assessment and PFT.

## 2017-12-20 NOTE — Patient Instructions (Signed)
Please go to the lab today for blood work.  I will call you with your results. We will alter treatment regimen(s) if indicated by your results.   Continue current regimen for now. We are not going to add anything. We will check a fructosamine level to get a better average of sugars before making further adjustments.   I am setting you up with Pulmonology for lung function testing. Start the Astelin instead of the Flonase for now. Let me know if this does not help.

## 2017-12-20 NOTE — Patient Instructions (Addendum)
History of snoring Multiple awakenings during the night Daytime fatigue  Moderate probability of significant sleep disordered breathing  We will order a home sleep study Possibility of treatment with an auto titrating CPAP  We will see you back in the office in about 3 months , neb treatment Call with any significant concerns

## 2017-12-24 ENCOUNTER — Other Ambulatory Visit: Payer: Self-pay | Admitting: Internal Medicine

## 2017-12-25 LAB — FRUCTOSAMINE: FRUCTOSAMINE: 281 umol/L (ref 205–285)

## 2017-12-26 NOTE — Telephone Encounter (Signed)
Please advise, last OV over a year ago

## 2017-12-26 NOTE — Telephone Encounter (Signed)
Per PCP as pt was lost for f/u

## 2017-12-27 ENCOUNTER — Other Ambulatory Visit: Payer: Self-pay | Admitting: Physician Assistant

## 2018-01-12 ENCOUNTER — Encounter: Payer: Self-pay | Admitting: Pulmonary Disease

## 2018-01-12 ENCOUNTER — Ambulatory Visit: Payer: Medicare Other | Admitting: Pulmonary Disease

## 2018-01-12 ENCOUNTER — Ambulatory Visit (INDEPENDENT_AMBULATORY_CARE_PROVIDER_SITE_OTHER)
Admission: RE | Admit: 2018-01-12 | Discharge: 2018-01-12 | Disposition: A | Discharge status: Home / Self Care | Payer: Medicare Other | Source: Ambulatory Visit | Attending: Pulmonary Disease | Admitting: Pulmonary Disease

## 2018-01-12 VITALS — BP 124/74 | HR 73 | Ht 67.0 in | Wt 181.0 lb

## 2018-01-12 DIAGNOSIS — R05 Cough: Secondary | ICD-10-CM

## 2018-01-12 DIAGNOSIS — G4733 Obstructive sleep apnea (adult) (pediatric): Secondary | ICD-10-CM | POA: Diagnosis not present

## 2018-01-12 DIAGNOSIS — R059 Cough, unspecified: Secondary | ICD-10-CM

## 2018-01-12 MED ORDER — IPRATROPIUM BROMIDE 0.03 % NA SOLN: 2.0000 | Freq: Two times a day (BID) | NASAL | 12 refills | Status: DC

## 2018-01-12 MED ORDER — DOXYCYCLINE HYCLATE 100 MG PO TABS: 100.0000 mg | ORAL_TABLET | Freq: Two times a day (BID) | ORAL | 0 refills | Status: DC

## 2018-01-12 NOTE — Progress Notes (Signed)
Marcus Norton    [id number]    [birth year]  Primary Care Physician:Martin, Luanna Cole, PA-C  Referring Physician: [name], PA-C [address] [place], [place] [address]  Chief complaint:   History of snoring Cough, shortness of breath, persistent runny nose  HPI: Patient is been evaluated for possible obstructive sleep apnea, has a sleep study pending at present  Complains of a persistent runny nose, persistent cough with increased sputum production Does not feel acutely ill but feels he is coughing up more present. He does have a chronic runny nose-inhaled steroids have not helped in the past Has tried multiple multiple other interventions without relief  Wakes up multiple times during the night to use the bathroom, about 3-4 times a night Usually goes to bed between 1030 and 11 PM Able to fall asleep easily Will usually wake up in about an hour and 1/2 to 2 hours Usually not able to sleep for more than 2 hours straight tries to get up in the morning between 7 and 8 AM Admits to a dry mouth in the mornings No morning headaches No night sweats Some memory loss, fair ability to concentrate  History of prostate issues for which he had a TURP in the past  He has used sleep aids in the past including diazepam at a low dose  ESS of 16  Occupation: Was a Community education officer for many years, farmed for many years Exposures: No significant exposures Smoking history: Reformed smoker  Outpatient Encounter Medications as of 01/12/2018  Medication Sig  . ACCU-CHEK AVIVA PLUS test strip USE TO TEST BLOOD SUAGR TWICE DAILY  . acetaminophen (TYLENOL) 325 MG tablet Take 325 mg by mouth 2 (two) times daily as needed for mild pain.  [name] alfuzosin (UROXATRAL) 10 MG 24 hr tablet Take 10 mg by mouth daily with breakfast.  . amLODipine (NORVASC) 5 MG tablet Take 1 tablet (5 mg total) by mouth daily.  [name] aspirin EC 81 MG tablet Take 81 mg by mouth at bedtime.   [name] atorvastatin  (LIPITOR) 10 MG tablet Take 1 tablet (10 mg total) by mouth daily.  [name] azelastine (ASTELIN) 0.1 % nasal spray Place 2 sprays into both nostrils 2 (two) times daily. Use in each nostril as directed  . Cyanocobalamin (VITAMIN B 12 PO) Take 2,000 Units by mouth daily.   . diazepam (VALIUM) 5 MG tablet Take 1 tablet (5 mg total) by mouth at bedtime as needed. for sleep  . docusate sodium (COLACE) 100 MG capsule Take 100 mg by mouth as needed for mild constipation.   [name] glucose blood test strip Accu-Chek Aviva Plus test strips  . Insulin Pen Needle (B-D ULTRAFINE III SHORT PEN) 31G X 8 MM MISC Inject insulin SQ daily Dx:E11.8  . JANUVIA 100 MG tablet TAKE 1 TABLET(100 MG) BY MOUTH DAILY  . LANTUS SOLOSTAR 100 UNIT/ML Solostar Pen Inject 18 Units into the skin daily at 10 pm. Increase by 2 units every 3 days  . lisinopril (PRINIVIL,ZESTRIL) 20 MG tablet TAKE 1 TABLET BY MOUTH DAILY  . Magnesium 200 MG TABS Take 1 tablet (200 mg total) by mouth daily.  . metFORMIN (GLUCOPHAGE) 1000 MG tablet TAKE 1 TABLET(1000 MG) BY MOUTH TWICE DAILY WITH A MEAL  . metoprolol tartrate (LOPRESSOR) 25 MG tablet TAKE 1 TABLET(25 MG) BY MOUTH TWICE DAILY  . pantoprazole (PROTONIX) 40 MG tablet Take 1 tablet (40 mg total) by mouth daily.  [name]  polyethylene glycol (MIRALAX / GLYCOLAX) packet Take 17 g by mouth every other day.   . tadalafil (CIALIS) 20 MG tablet Take 0.5-1 tablets (10-20 mg total) by mouth every other day as needed for erectile dysfunction.  [name] doxycycline (VIBRA-TABS) 100 MG tablet Take 1 tablet (100 mg total) by mouth 2 (two) times daily.  [name] ipratropium (ATROVENT) 0.03 % nasal spray Place 2 sprays into both nostrils every 12 (twelve) hours.  [name] loratadine (CLARITIN) 10 MG tablet Take 10 mg by mouth daily as needed for allergies.    No facility-administered encounter medications on file as of 01/12/2018.     Allergies as of 01/12/2018 - Review Complete 01/12/2018  Allergen Reaction Noted  . Plavix  [clopidogrel bisulfate] Shortness Of Breath 02/24/2016  . Oxycontin [oxycodone hcl] Other (See Comments) 05/14/2012  . Penicillins Other (See Comments) 04/30/2013  . Pioglitazone Other (See Comments) 04/30/2013  . Prednisone Other (See Comments) 07/14/2011  . Simvastatin Other (See Comments) 05/14/2012    Past Medical History:  Diagnosis Date  . 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 Arthritis    "lower back" (07/29/2016)  . ASCVD (arteriosclerotic cardiovascular disease)    -luminal irregularities in 1998 and 2005; normal EF  . Benign prostatic hypertrophy    status post transurethral resection of the prostate  . CAD (coronary artery disease)    a. Cath 07/29/16 99% ostial RCA & 70% pRCA s/p cutting balloon angioplasty and single SYNERGY DES 2.5X28. 100% very Distal 1st RPLB lesion --> the occlusion site moved further distal with guidewire advancement;  40% ostial LAD; 50% Ost Cx to Prox Cx lesion.  . Coronary artery disease   . Dysphagia   . Erectile dysfunction   . GERD (gastroesophageal reflux disease)   . Heart murmur   . History of hiatal hernia   . History of kidney stones   . Hyperlipidemia   . Hypertension   . Seasonal allergies   . Tobacco abuse    20 pack years; discontinued 1995  . Type II diabetes mellitus ([facility])     Past Surgical History:  Procedure Laterality Date  . APPENDECTOMY    . CATARACT EXTRACTION W/ INTRAOCULAR LENS  IMPLANT, BILATERAL Bilateral   . COLONOSCOPY  07/20/2011   Dr. Gala Romney: multiple hyperplastic polyps. Surveillance 2018  . COLONOSCOPY N/A 06/30/2016   Procedure: COLONOSCOPY;  Surgeon: Daneil Dolin, MD;  [facility];  Service: Endoscopy;  Laterality: N/A;  10:00am  . COLONOSCOPY W/ BIOPSIES AND POLYPECTOMY  07/08/08, 06/2011   Dr Madolyn Frieze papilla, diminutive rectal polyp ablated the, left-sided diverticula, piecemeal polypectomy of hepatic flexure adenomatous polyp, diminutive polyps near hepatic flexure ablated  . CORONARY  ANGIOPLASTY WITH STENT PLACEMENT  07/29/2016  . CORONARY STENT INTERVENTION N/A 07/29/2016   Procedure: Coronary Stent Intervention;  Surgeon: Leonie Man, MD;  Location: [facility];  Service: Cardiovascular;  Laterality: N/A;  RCA  . ESOPHAGOGASTRODUODENOSCOPY  2013   erosive reflux esophagitis, s/p empiric dilation. chronic duodenitis.   [name] LEFT HEART CATH AND CORONARY ANGIOGRAPHY N/A 07/29/2016   Procedure: Left Heart Cath and Coronary Angiography;  Surgeon: Leonie Man, MD;  Location: [facility];  Service: Cardiovascular;  Laterality: N/A;  . NASAL SEPTUM SURGERY    . NASAL SINUS SURGERY     "cleaned out my sinus"  . PERIPHERAL VASCULAR CATHETERIZATION N/A 11/26/2015   Procedure: Abdominal Aortogram w/Lower Extremity;  Surgeon: Wellington Hampshire, MD;  Location: [facility];  Service: Cardiovascular;  Laterality: N/A;  . TONSILLECTOMY    . TRANSURETHRAL RESECTION OF PROSTATE  2009    Family History  Problem Relation Age of Onset  . Ovarian cancer Mother   . Colon cancer Sister 57       deceased from colon cancer    Social History   Socioeconomic History  . Marital status: Married    Spouse name: Not on file  . Number of children: 2  . Years of education: Not on file  . Highest education level: Not on file  Occupational History  . 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 Financial resource strain: Not on file  . Food insecurity:    Worry: Not on file    Inability: Not on file  . Transportation needs:    Medical: Not on file    Non-medical: Not on file  Tobacco Use  . Smoking status: Former Smoker    Packs/day: 1.00    Years: 15.00    Pack years: 15.00    Types: Cigarettes    Start date: 03/08/1965    Last attempt to quit: 07/07/1993    Years since quitting: 24.5  . Smokeless tobacco: Never Used  Substance and Sexual Activity  . Alcohol use: No    Alcohol/week: 0.0  standard drinks    Frequency: Never    Comment: 07/29/2016 "last drink was 4 wks ago; had a beer"  . Drug use: No  . Sexual activity: Not Currently  Lifestyle  . Physical activity:    Days per week: Not on file    Minutes per session: Not on file  . Stress: Not on file  Relationships  . Social connections:    Talks on phone: Not on file    Gets together: Not on file    Attends religious service: Not on file    Active member of club or organization: Not on file    Attends meetings of clubs or organizations: Not on file    Relationship status: Not on file  . Intimate partner violence:    Fear of current or ex partner: Not on file    Emotionally abused: Not on file    Physically abused: Not on file    Forced sexual activity: Not on file  Other Topics Concern  . Not on file  Social History Narrative  . Not on file    Review of Systems  Constitutional: Negative.  Negative for unexpected weight change.  HENT: Positive for postnasal drip.        Sinus problems, nasal stuffiness, runny nose  Eyes: Negative.   Respiratory: Positive for cough. Negative for shortness of breath.        Significant history of snoring  Cardiovascular: Negative.   Gastrointestinal: Negative.     Vitals:   01/12/18 1142  BP: 124/74  Pulse: 73  SpO2: 95%     Physical Exam  Constitutional: He appears well-developed and well-nourished. No distress.  HENT:  Head: Normocephalic and atraumatic.  Eyes: Pupils are equal, round, and reactive to light. Conjunctivae are normal. Right eye exhibits no discharge. Left eye exhibits no discharge.  Neck: Normal range of motion. Neck supple. No tracheal deviation present. No thyromegaly present.  Cardiovascular: Normal rate and regular rhythm.  Pulmonary/Chest: Effort normal. No respiratory distress. He has no wheezes. He has no rales.  Abdominal: Soft. Bowel sounds are normal. He exhibits no distension. There is no tenderness.  Skin: He  is not diaphoretic.    Psychiatric: He has a normal mood and affect.   Data Reviewed: Records reviewed  Assessment:  History of snoring -Home sleep study pending  Multiple awakenings at night/nocturia -The awakenings may be on account of sleep disordered breathing -Associated nocturia because he is already awake versus nocturia been more common in patients with untreated sleep disordered breathing  Cough and shortness of breath -We will obtain chest x-ray, PFT when more stable -Call him a course of doxycycline  Chronic rhinitis -We will try him on Atrovent nasal  Plan/Recommendations:  We will follow-up on his sleep study  Follow-up on the chest x-ray  Prednisone for rhinitis and persistent runny nose  Call with any significant concerns    Sherrilyn Rist MD [facility] 01/12/2018, 1:53 PM  CC: [name], PA-C

## 2018-01-12 NOTE — Patient Instructions (Addendum)
Persistent runny nose Chronic cough  Atrovent nasal 0.03, 2 puffs twice daily Doxycycline 100 po BID for 10 days  CXR  Will see you back in 3 months

## 2018-01-13 DIAGNOSIS — G4733 Obstructive sleep apnea (adult) (pediatric): Secondary | ICD-10-CM | POA: Diagnosis not present

## 2018-01-17 ENCOUNTER — Other Ambulatory Visit: Payer: Self-pay | Admitting: *Deleted

## 2018-01-17 DIAGNOSIS — R0683 Snoring: Secondary | ICD-10-CM

## 2018-01-25 ENCOUNTER — Telehealth: Payer: Self-pay | Admitting: Pulmonary Disease

## 2018-01-25 NOTE — Telephone Encounter (Signed)
Dr. Ander Slade has reviewed the home sleep test this showed Moderate sleep apnea.  Mild  Recommendations   Treatment options are CPAP with the settings auto 5 to 15.    Weight loss measures .   Advise against driving while sleepy & against medication with sedative side effects.    Make appointment for 3 months for compliance with download with Dr. Ander Slade.   Called and spoke with the patient he would like to come in for a visit to discuss results. Apt made nothing further needed at this time.

## 2018-01-27 ENCOUNTER — Other Ambulatory Visit: Payer: Self-pay | Admitting: Physician Assistant

## 2018-01-27 ENCOUNTER — Other Ambulatory Visit: Payer: Self-pay | Admitting: Pulmonary Disease

## 2018-01-27 ENCOUNTER — Ambulatory Visit: Payer: Medicare Other | Admitting: Pulmonary Disease

## 2018-01-27 ENCOUNTER — Encounter: Payer: Self-pay | Admitting: Pulmonary Disease

## 2018-01-27 VITALS — BP 120/60 | HR 71 | Ht 67.0 in | Wt 181.0 lb

## 2018-01-27 DIAGNOSIS — G4733 Obstructive sleep apnea (adult) (pediatric): Secondary | ICD-10-CM | POA: Diagnosis not present

## 2018-01-27 MED ORDER — ROPINIROLE HCL 0.5 MG PO TABS: ORAL_TABLET | ORAL | 1 refills | Status: DC

## 2018-01-27 NOTE — Patient Instructions (Signed)
Moderate obstructive sleep apnea  Symptoms of restless legs  Will initiate Requip  I will see you back in the office in about 6 to 8 weeks  Encouraged to sleep in a more upright position Continue to work on weight loss efforts Find more information about CPAP treatment

## 2018-01-27 NOTE — Progress Notes (Signed)
Marcus Norton    [id number]    May 24, 1940  Primary Care Physician:Martin, Luanna Cole, PA-C  Referring Physician: [name], PA-C [address] [place], [place] [address]  Chief complaint:   History of snoring Recent diagnosis of obstructive sleep apnea  HPI: Sleep study did reveal moderate obstructive sleep apnea, patient is reluctant to start any treatment at present we discussed options of treatment today He gives a history suggesting presence of significant restless legs, history corroborated by spouse A lot of leg restlessness at night especially when he is trying to calm down, sometimes gets out of bed to get his legs to settle down, occasionally will soak his legs and water to get rid of a sensation 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  He does have a chronic runny nose-inhaled steroids have not helped in the past Has tried multiple multiple other interventions without relief  Wakes up multiple times during the night to use the bathroom, about 3-4 times a night Usually goes to bed between 1030 and 11 PM Able to fall asleep easily Will usually wake up in about an hour and 1/2 to 2 hours Usually not able to sleep for more than 2 hours straight tries to get up in the morning between 7 and 8 AM Admits to a dry mouth in the mornings No morning headaches No night sweats Some memory loss, fair ability to concentrate  History of prostate issues for which he had a TURP in the past  He has used sleep aids in the past including diazepam at a low dose  ESS of 16  Occupation: Was a Community education officer for many years, farmed for many years Exposures: No significant exposures Smoking history: Reformed smoker  Outpatient Encounter Medications as of 01/27/2018  Medication Sig  . ACCU-CHEK AVIVA PLUS test strip USE TO TEST BLOOD SUAGR TWICE DAILY  . acetaminophen (TYLENOL) 325 MG tablet Take 325 mg by mouth 2 (two) times daily as needed for mild pain.  [name]  alfuzosin (UROXATRAL) 10 MG 24 hr tablet Take 10 mg by mouth daily with breakfast.  . amLODipine (NORVASC) 5 MG tablet Take 1 tablet (5 mg total) by mouth daily.  [name] aspirin EC 81 MG tablet Take 81 mg by mouth at bedtime.   [name] atorvastatin (LIPITOR) 10 MG tablet Take 1 tablet (10 mg total) by mouth daily.  [name] azelastine (ASTELIN) 0.1 % nasal spray Place 2 sprays into both nostrils 2 (two) times daily. Use in each nostril as directed  . Cyanocobalamin (VITAMIN B 12 PO) Take 2,000 Units by mouth daily.   . diazepam (VALIUM) 5 MG tablet Take 1 tablet (5 mg total) by mouth at bedtime as needed. for sleep  . docusate sodium (COLACE) 100 MG capsule Take 100 mg by mouth as needed for mild constipation.   [name] doxycycline (VIBRA-TABS) 100 MG tablet Take 1 tablet (100 mg total) by mouth 2 (two) times daily.  [name] glucose blood test strip Accu-Chek Aviva Plus test strips  . Insulin Pen Needle (B-D ULTRAFINE III SHORT PEN) 31G X 8 MM MISC Inject insulin SQ daily Dx:E11.8  . ipratropium (ATROVENT) 0.03 % nasal spray Place 2 sprays into both nostrils every 12 (twelve) hours.  [name] JANUVIA 100 MG tablet TAKE 1 TABLET(100 MG) BY MOUTH DAILY  . LANTUS SOLOSTAR 100 UNIT/ML Solostar Pen Inject 18 Units into the skin daily at 10 pm. Increase by 2 units every 3 days  .  lisinopril (PRINIVIL,ZESTRIL) 20 MG tablet TAKE 1 TABLET BY MOUTH DAILY  . loratadine (CLARITIN) 10 MG tablet Take 10 mg by mouth daily as needed for allergies.   . Magnesium 200 MG TABS Take 1 tablet (200 mg total) by mouth daily.  . metFORMIN (GLUCOPHAGE) 1000 MG tablet TAKE 1 TABLET(1000 MG) BY MOUTH TWICE DAILY WITH A MEAL  . metoprolol tartrate (LOPRESSOR) 25 MG tablet TAKE 1 TABLET(25 MG) BY MOUTH TWICE DAILY  . pantoprazole (PROTONIX) 40 MG tablet Take 1 tablet (40 mg total) by mouth daily.  . polyethylene glycol (MIRALAX / GLYCOLAX) packet Take 17 g by mouth every other day.   . tadalafil (CIALIS) 20 MG tablet Take 0.5-1 tablets (10-20 mg total) by mouth  every other day as needed for erectile dysfunction.   No facility-administered encounter medications on file as of 01/27/2018.     Allergies as of 01/27/2018 - Review Complete 01/12/2018  Allergen Reaction Noted  . Plavix [clopidogrel bisulfate] Shortness Of Breath 02/24/2016  . Oxycontin [oxycodone hcl] Other (See Comments) 05/14/2012  . Penicillins Other (See Comments) 04/30/2013  . Pioglitazone Other (See Comments) 04/30/2013  . Prednisone Other (See Comments) 07/14/2011  . Simvastatin Other (See Comments) 05/14/2012    Past Medical History:  Diagnosis Date  . 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 Arthritis    "lower back" (07/29/2016)  . ASCVD (arteriosclerotic cardiovascular disease)    -luminal irregularities in 1998 and 2005; normal EF  . Benign prostatic hypertrophy    status post transurethral resection of the prostate  . CAD (coronary artery disease)    a. Cath 07/29/16 99% ostial RCA & 70% pRCA s/p cutting balloon angioplasty and single SYNERGY DES 2.5X28. 100% very Distal 1st RPLB lesion --> the occlusion site moved further distal with guidewire advancement;  40% ostial LAD; 50% Ost Cx to Prox Cx lesion.  . Coronary artery disease   . Dysphagia   . Erectile dysfunction   . GERD (gastroesophageal reflux disease)   . Heart murmur   . History of hiatal hernia   . History of kidney stones   . Hyperlipidemia   . Hypertension   . Seasonal allergies   . Tobacco abuse    20 pack years; discontinued 1995  . Type II diabetes mellitus ([facility])     Past Surgical History:  Procedure Laterality Date  . APPENDECTOMY    . CATARACT EXTRACTION W/ INTRAOCULAR LENS  IMPLANT, BILATERAL Bilateral   . COLONOSCOPY  07/20/2011   Dr. Gala Romney: multiple hyperplastic polyps. Surveillance 2018  . COLONOSCOPY N/A 06/30/2016   Procedure: COLONOSCOPY;  Surgeon: Daneil Dolin, MD;  [facility];  Service: Endoscopy;  Laterality: N/A;  10:00am  . COLONOSCOPY W/ BIOPSIES AND POLYPECTOMY   07/08/08, 06/2011   Dr Madolyn Frieze papilla, diminutive rectal polyp ablated the, left-sided diverticula, piecemeal polypectomy of hepatic flexure adenomatous polyp, diminutive polyps near hepatic flexure ablated  . CORONARY ANGIOPLASTY WITH STENT PLACEMENT  07/29/2016  . CORONARY STENT INTERVENTION N/A 07/29/2016   Procedure: Coronary Stent Intervention;  Surgeon: Leonie Man, MD;  Location: [facility];  Service: Cardiovascular;  Laterality: N/A;  RCA  . ESOPHAGOGASTRODUODENOSCOPY  2013   erosive reflux esophagitis, s/p empiric dilation. chronic duodenitis.   [name] LEFT HEART CATH AND CORONARY ANGIOGRAPHY N/A 07/29/2016   Procedure: Left Heart Cath and Coronary Angiography;  Surgeon: Leonie Man, MD;  Location: [facility];  Service: Cardiovascular;  Laterality: N/A;  . NASAL SEPTUM SURGERY    .  NASAL SINUS SURGERY     "cleaned out my sinus"  . PERIPHERAL VASCULAR CATHETERIZATION N/A 11/26/2015   Procedure: Abdominal Aortogram w/Lower Extremity;  Surgeon: Wellington Hampshire, MD;  Location: [facility];  Service: Cardiovascular;  Laterality: N/A;  . TONSILLECTOMY    . TRANSURETHRAL RESECTION OF PROSTATE  2009    Family History  Problem Relation Age of Onset  . Ovarian cancer Mother   . Colon cancer Sister 58       deceased from colon cancer    Social History   Socioeconomic History  . Marital status: Married    Spouse name: Not on file  . Number of children: 2  . Years of education: Not on file  . Highest education level: Not on file  Occupational History  . 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 Financial resource strain: Not on file  . Food insecurity:    Worry: Not on file    Inability: Not on file  . Transportation needs:    Medical: Not on file    Non-medical: Not on file  Tobacco Use  . Smoking status: Former Smoker    Packs/day: 1.00    Years: 15.00    Pack years: 15.00     Types: Cigarettes    Start date: 03/08/1965    Last attempt to quit: 07/07/1993    Years since quitting: 24.5  . Smokeless tobacco: Never Used  Substance and Sexual Activity  . Alcohol use: No    Alcohol/week: 0.0 standard drinks    Frequency: Never    Comment: 07/29/2016 "last drink was 4 wks ago; had a beer"  . Drug use: No  . Sexual activity: Not Currently  Lifestyle  . Physical activity:    Days per week: Not on file    Minutes per session: Not on file  . Stress: Not on file  Relationships  . Social connections:    Talks on phone: Not on file    Gets together: Not on file    Attends religious service: Not on file    Active member of club or organization: Not on file    Attends meetings of clubs or organizations: Not on file    Relationship status: Not on file  . Intimate partner violence:    Fear of current or ex partner: Not on file    Emotionally abused: Not on file    Physically abused: Not on file    Forced sexual activity: Not on file  Other Topics Concern  . Not on file  Social History Narrative  . Not on file    Review of Systems  Constitutional: Negative.  Negative for unexpected weight change.  HENT: Positive for postnasal drip.        Sinus problems, nasal stuffiness, runny nose  Eyes: Negative.   Respiratory: Positive for apnea. Negative for cough and shortness of breath.        Significant history of snoring  Cardiovascular: Negative.   Gastrointestinal: Negative.   Psychiatric/Behavioral: Positive for sleep disturbance.    Vitals:   01/27/18 1534  BP: 120/60  Pulse: 71  SpO2: 96%   Physical Exam  Constitutional: He appears well-developed and well-nourished. No distress.  HENT:  Head: Normocephalic and atraumatic.  Eyes: Pupils are equal, round, and reactive to light. Conjunctivae are normal. Right eye exhibits no discharge. Left eye exhibits no discharge.  Neck: Normal range of motion. Neck supple.  No tracheal deviation present. No thyromegaly  present.  Cardiovascular: Normal rate and regular rhythm.  Pulmonary/Chest: Effort normal. No respiratory distress. He has no wheezes. He has no rales.  Abdominal: Soft. Bowel sounds are normal. He exhibits no distension. There is no tenderness.  Skin: He is not diaphoretic.  Psychiatric: He has a normal mood and affect.   Data Reviewed: Records reviewed  Assessment:   Moderate obstructive sleep apnea -Reluctant to initiate treatment at the present time -Sleep with the head of the bed elevated, encourage lateral sleep -We will revisit at his next visit  Restless leg syndrome -We will initiate Requip  Multiple awakenings at night/nocturia -The awakenings may be on account of sleep disordered breathing -Associated nocturia because he is already awake versus nocturia been more common in patients with untreated sleep disordered breathing  Chronic rhinitis -Continue Atrovent nasal  Plan/Recommendations:  Continue use of Requip  We will continue to talk about management of obstructive sleep apnea, he will work on weight loss and regular exercises  Call with any significant concerns    Sherrilyn Rist MD [facility] 01/27/2018, 4:05 PM  CC: [name], PA-C

## 2018-02-04 ENCOUNTER — Emergency Department (HOSPITAL_COMMUNITY): Payer: Medicare Other

## 2018-02-04 ENCOUNTER — Inpatient Hospital Stay (HOSPITAL_COMMUNITY)
Admission: EM | Admit: 2018-02-04 | Discharge: 2018-02-06 | DRG: 871 | Disposition: A | Discharge status: Home / Self Care | Payer: Medicare Other | Attending: Internal Medicine | Admitting: Internal Medicine

## 2018-02-04 ENCOUNTER — Encounter (HOSPITAL_COMMUNITY): Payer: Self-pay | Admitting: *Deleted

## 2018-02-04 ENCOUNTER — Other Ambulatory Visit: Payer: Self-pay

## 2018-02-04 DIAGNOSIS — Z86010 Personal history of colonic polyps: Secondary | ICD-10-CM

## 2018-02-04 DIAGNOSIS — Z955 Presence of coronary angioplasty implant and graft: Secondary | ICD-10-CM | POA: Diagnosis not present

## 2018-02-04 DIAGNOSIS — Z794 Long term (current) use of insulin: Secondary | ICD-10-CM

## 2018-02-04 DIAGNOSIS — G2581 Restless legs syndrome: Secondary | ICD-10-CM | POA: Diagnosis present

## 2018-02-04 DIAGNOSIS — Z87442 Personal history of urinary calculi: Secondary | ICD-10-CM

## 2018-02-04 DIAGNOSIS — Z9841 Cataract extraction status, right eye: Secondary | ICD-10-CM

## 2018-02-04 DIAGNOSIS — E118 Type 2 diabetes mellitus with unspecified complications: Secondary | ICD-10-CM | POA: Diagnosis not present

## 2018-02-04 DIAGNOSIS — E1165 Type 2 diabetes mellitus with hyperglycemia: Secondary | ICD-10-CM | POA: Diagnosis present

## 2018-02-04 DIAGNOSIS — E871 Hypo-osmolality and hyponatremia: Secondary | ICD-10-CM | POA: Diagnosis present

## 2018-02-04 DIAGNOSIS — E1151 Type 2 diabetes mellitus with diabetic peripheral angiopathy without gangrene: Secondary | ICD-10-CM | POA: Diagnosis present

## 2018-02-04 DIAGNOSIS — I10 Essential (primary) hypertension: Secondary | ICD-10-CM | POA: Diagnosis present

## 2018-02-04 DIAGNOSIS — F411 Generalized anxiety disorder: Secondary | ICD-10-CM | POA: Diagnosis present

## 2018-02-04 DIAGNOSIS — E785 Hyperlipidemia, unspecified: Secondary | ICD-10-CM | POA: Diagnosis present

## 2018-02-04 DIAGNOSIS — J9601 Acute respiratory failure with hypoxia: Secondary | ICD-10-CM | POA: Diagnosis present

## 2018-02-04 DIAGNOSIS — N400 Enlarged prostate without lower urinary tract symptoms: Secondary | ICD-10-CM | POA: Diagnosis present

## 2018-02-04 DIAGNOSIS — Z87891 Personal history of nicotine dependence: Secondary | ICD-10-CM | POA: Diagnosis not present

## 2018-02-04 DIAGNOSIS — N529 Male erectile dysfunction, unspecified: Secondary | ICD-10-CM | POA: Diagnosis present

## 2018-02-04 DIAGNOSIS — M479 Spondylosis, unspecified: Secondary | ICD-10-CM | POA: Diagnosis present

## 2018-02-04 DIAGNOSIS — Z9842 Cataract extraction status, left eye: Secondary | ICD-10-CM

## 2018-02-04 DIAGNOSIS — R6520 Severe sepsis without septic shock: Secondary | ICD-10-CM | POA: Diagnosis present

## 2018-02-04 DIAGNOSIS — J209 Acute bronchitis, unspecified: Secondary | ICD-10-CM | POA: Diagnosis present

## 2018-02-04 DIAGNOSIS — I2510 Atherosclerotic heart disease of native coronary artery without angina pectoris: Secondary | ICD-10-CM | POA: Diagnosis present

## 2018-02-04 DIAGNOSIS — Z7982 Long term (current) use of aspirin: Secondary | ICD-10-CM

## 2018-02-04 DIAGNOSIS — IMO0001 Reserved for inherently not codable concepts without codable children: Secondary | ICD-10-CM

## 2018-02-04 DIAGNOSIS — A419 Sepsis, unspecified organism: Secondary | ICD-10-CM | POA: Diagnosis present

## 2018-02-04 DIAGNOSIS — K219 Gastro-esophageal reflux disease without esophagitis: Secondary | ICD-10-CM | POA: Diagnosis present

## 2018-02-04 DIAGNOSIS — K5900 Constipation, unspecified: Secondary | ICD-10-CM | POA: Diagnosis present

## 2018-02-04 DIAGNOSIS — G4733 Obstructive sleep apnea (adult) (pediatric): Secondary | ICD-10-CM | POA: Diagnosis present

## 2018-02-04 DIAGNOSIS — J441 Chronic obstructive pulmonary disease with (acute) exacerbation: Secondary | ICD-10-CM

## 2018-02-04 DIAGNOSIS — Z888 Allergy status to other drugs, medicaments and biological substances status: Secondary | ICD-10-CM

## 2018-02-04 DIAGNOSIS — R0602 Shortness of breath: Secondary | ICD-10-CM | POA: Diagnosis not present

## 2018-02-04 DIAGNOSIS — Z880 Allergy status to penicillin: Secondary | ICD-10-CM

## 2018-02-04 DIAGNOSIS — Z885 Allergy status to narcotic agent status: Secondary | ICD-10-CM

## 2018-02-04 DIAGNOSIS — Z961 Presence of intraocular lens: Secondary | ICD-10-CM | POA: Diagnosis present

## 2018-02-04 DIAGNOSIS — Z79899 Other long term (current) drug therapy: Secondary | ICD-10-CM

## 2018-02-04 DIAGNOSIS — I739 Peripheral vascular disease, unspecified: Secondary | ICD-10-CM | POA: Diagnosis present

## 2018-02-04 LAB — COMPREHENSIVE METABOLIC PANEL
ALT: 20 U/L (ref 0–44)
AST: 19 U/L (ref 15–41)
Albumin: 4.1 g/dL (ref 3.5–5.0)
Alkaline Phosphatase: 49 U/L (ref 38–126)
Anion gap: 13 (ref 5–15)
BILIRUBIN TOTAL: 1.4 mg/dL — AB (ref 0.3–1.2)
BUN: 28 mg/dL — ABNORMAL HIGH (ref 8–23)
CALCIUM: 9.2 mg/dL (ref 8.9–10.3)
CO2: 24 mmol/L (ref 22–32)
Chloride: 93 mmol/L — ABNORMAL LOW (ref 98–111)
Creatinine, Ser: 0.74 mg/dL (ref 0.61–1.24)
GFR calc Af Amer: >60 mL/min (ref >60)
GFR calc non Af Amer: >60 mL/min (ref >60)
Glucose, Bld: 196 mg/dL — ABNORMAL HIGH (ref 70–99)
Potassium: 4.5 mmol/L (ref 3.5–5.1)
Sodium: 130 mmol/L — ABNORMAL LOW (ref 135–145)
TOTAL PROTEIN: 7.4 g/dL (ref 6.5–8.1)

## 2018-02-04 LAB — URINALYSIS, ROUTINE W REFLEX MICROSCOPIC
Bacteria, UA: NONE SEEN
Bilirubin Urine: NEGATIVE
Hgb urine dipstick: NEGATIVE
Ketones, ur: 5 mg/dL — AB
Leukocytes, UA: NEGATIVE
Nitrite: NEGATIVE
Protein, ur: NEGATIVE mg/dL
Specific Gravity, Urine: 1.016 (ref 1.005–1.030)
pH: 5 (ref 5.0–8.0)

## 2018-02-04 LAB — I-STAT TROPONIN, ED: Troponin i, poc: 0.02 ng/mL (ref 0.00–0.08)

## 2018-02-04 LAB — CBC
HCT: 38.3 % — ABNORMAL LOW (ref 39.0–52.0)
HEMOGLOBIN: 12.5 g/dL — AB (ref 13.0–17.0)
MCH: 33.7 pg (ref 26.0–34.0)
MCHC: 32.6 g/dL (ref 30.0–36.0)
MCV: 103.2 fL — AB (ref 80.0–100.0)
Platelets: 221 10*3/uL (ref 150–400)
RBC: 3.71 MIL/uL — ABNORMAL LOW (ref 4.22–5.81)
RDW: 12.6 % (ref 11.5–15.5)
WBC: 10.1 10*3/uL (ref 4.0–10.5)
nRBC: 0 % (ref 0.0–0.2)

## 2018-02-04 LAB — CREATININE, SERUM
Creatinine, Ser: 0.77 mg/dL (ref 0.61–1.24)
GFR calc Af Amer: >60 mL/min (ref >60)
GFR calc non Af Amer: >60 mL/min (ref >60)

## 2018-02-04 LAB — CBC WITH DIFFERENTIAL/PLATELET
Abs Immature Granulocytes: 0.03 10*3/uL (ref 0.00–0.07)
Basophils Absolute: 0.1 10*3/uL (ref 0.0–0.1)
Basophils Relative: 0 %
Eosinophils Absolute: 0 10*3/uL (ref 0.0–0.5)
Eosinophils Relative: 0 %
HEMATOCRIT: 41.6 % (ref 39.0–52.0)
Hemoglobin: 13.5 g/dL (ref 13.0–17.0)
Immature Granulocytes: 0 %
Lymphocytes Relative: 10 %
Lymphs Abs: 1.1 10*3/uL (ref 0.7–4.0)
MCH: 33.2 pg (ref 26.0–34.0)
MCHC: 32.5 g/dL (ref 30.0–36.0)
MCV: 102.2 fL — ABNORMAL HIGH (ref 80.0–100.0)
Monocytes Absolute: 1.5 10*3/uL — ABNORMAL HIGH (ref 0.1–1.0)
Monocytes Relative: 12 %
Neutro Abs: 9.1 10*3/uL — ABNORMAL HIGH (ref 1.7–7.7)
Neutrophils Relative %: 78 %
Platelets: 250 10*3/uL (ref 150–400)
RBC: 4.07 MIL/uL — ABNORMAL LOW (ref 4.22–5.81)
RDW: 12.6 % (ref 11.5–15.5)
WBC Morphology: INCREASED
WBC: 11.8 10*3/uL — ABNORMAL HIGH (ref 4.0–10.5)
nRBC: 0 % (ref 0.0–0.2)

## 2018-02-04 LAB — INFLUENZA PANEL BY PCR (TYPE A & B)
Influenza A By PCR: NEGATIVE
Influenza B By PCR: NEGATIVE

## 2018-02-04 LAB — LACTIC ACID, PLASMA
Lactic Acid, Venous: 3.1 mmol/L (ref 0.5–1.9)
Lactic Acid, Venous: 3.3 mmol/L (ref 0.5–1.9)

## 2018-02-04 LAB — I-STAT CG4 LACTIC ACID, ED
Lactic Acid, Venous: 2.81 mmol/L (ref 0.5–1.9)
Lactic Acid, Venous: 3.68 mmol/L (ref 0.5–1.9)

## 2018-02-04 LAB — GLUCOSE, CAPILLARY: Glucose-Capillary: 202 mg/dL — ABNORMAL HIGH (ref 70–99)

## 2018-02-04 LAB — PROCALCITONIN: Procalcitonin: <0.1 ng/mL

## 2018-02-04 MED ORDER — METOPROLOL TARTRATE 25 MG PO TABS
25.0000 mg | ORAL_TABLET | Freq: Two times a day (BID) | ORAL | Status: DC
  Administered 2018-02-04 – 2018-02-06 (×4): 25 mg via ORAL
  Filled 2018-02-04 (×4): qty 1

## 2018-02-04 MED ORDER — ONDANSETRON HCL 4 MG/2ML IJ SOLN: 4.0000 mg | Freq: Four times a day (QID) | INTRAMUSCULAR | Status: DC | PRN

## 2018-02-04 MED ORDER — GUAIFENESIN-DM 100-10 MG/5ML PO SYRP
5.0000 mL | ORAL_SOLUTION | ORAL | Status: DC | PRN
  Administered 2018-02-05 – 2018-02-06 (×3): 5 mL via ORAL
  Filled 2018-02-04 (×3): qty 10

## 2018-02-04 MED ORDER — AZELASTINE HCL 0.1 % NA SOLN
2.0000 | Freq: Two times a day (BID) | NASAL | Status: DC
  Administered 2018-02-04 – 2018-02-06 (×4): 2 via NASAL
  Filled 2018-02-04: qty 30

## 2018-02-04 MED ORDER — INSULIN GLARGINE 100 UNIT/ML ~~LOC~~ SOLN
20.0000 [IU] | Freq: Every day | SUBCUTANEOUS | Status: DC
  Administered 2018-02-04 – 2018-02-05 (×2): 20 [IU] via SUBCUTANEOUS
  Filled 2018-02-04 (×3): qty 0.2

## 2018-02-04 MED ORDER — INSULIN GLARGINE 100 UNIT/ML SOLOSTAR PEN: 20.0000 [IU] | PEN_INJECTOR | Freq: Every day | SUBCUTANEOUS | Status: DC

## 2018-02-04 MED ORDER — DIAZEPAM 5 MG PO TABS
5.0000 mg | ORAL_TABLET | Freq: Every evening | ORAL | Status: DC | PRN
  Administered 2018-02-05: 5 mg via ORAL
  Filled 2018-02-04: qty 1

## 2018-02-04 MED ORDER — ROPINIROLE HCL 0.5 MG PO TABS: 0.5000 mg | ORAL_TABLET | Freq: Every day | ORAL | Status: DC

## 2018-02-04 MED ORDER — ORAL CARE MOUTH RINSE
15.0000 mL | Freq: Two times a day (BID) | OROMUCOSAL | Status: DC
  Administered 2018-02-04 – 2018-02-06 (×4): 15 mL via OROMUCOSAL

## 2018-02-04 MED ORDER — ACETAMINOPHEN 650 MG RE SUPP: 650.0000 mg | Freq: Four times a day (QID) | RECTAL | Status: DC | PRN

## 2018-02-04 MED ORDER — ALBUTEROL SULFATE (2.5 MG/3ML) 0.083% IN NEBU
2.5000 mg | INHALATION_SOLUTION | Freq: Three times a day (TID) | RESPIRATORY_TRACT | Status: DC
  Administered 2018-02-04 – 2018-02-05 (×4): 2.5 mg via RESPIRATORY_TRACT
  Filled 2018-02-04 (×5): qty 3

## 2018-02-04 MED ORDER — ROPINIROLE HCL 1 MG PO TABS: 1.0000 mg | ORAL_TABLET | Freq: Every day | ORAL | Status: DC

## 2018-02-04 MED ORDER — ALFUZOSIN HCL ER 10 MG PO TB24
10.0000 mg | ORAL_TABLET | Freq: Every day | ORAL | Status: DC
  Administered 2018-02-05 – 2018-02-06 (×2): 10 mg via ORAL
  Filled 2018-02-04 (×2): qty 1

## 2018-02-04 MED ORDER — BENZONATATE 100 MG PO CAPS
100.0000 mg | ORAL_CAPSULE | Freq: Once | ORAL | Status: AC
  Administered 2018-02-04: 100 mg via ORAL
  Filled 2018-02-04: qty 1

## 2018-02-04 MED ORDER — ALBUTEROL SULFATE (2.5 MG/3ML) 0.083% IN NEBU
5.0000 mg | INHALATION_SOLUTION | Freq: Once | RESPIRATORY_TRACT | Status: AC
  Administered 2018-02-04: 5 mg via RESPIRATORY_TRACT
  Filled 2018-02-04: qty 6

## 2018-02-04 MED ORDER — ROPINIROLE HCL 1 MG PO TABS
0.5000 mg | ORAL_TABLET | Freq: Every day | ORAL | Status: DC
  Administered 2018-02-05 (×2): 0.5 mg via ORAL
  Filled 2018-02-04 (×3): qty 1

## 2018-02-04 MED ORDER — ALBUTEROL SULFATE (2.5 MG/3ML) 0.083% IN NEBU: 2.5000 mg | INHALATION_SOLUTION | Freq: Four times a day (QID) | RESPIRATORY_TRACT | Status: DC

## 2018-02-04 MED ORDER — PANTOPRAZOLE SODIUM 40 MG PO TBEC
40.0000 mg | DELAYED_RELEASE_TABLET | Freq: Every day | ORAL | Status: DC
  Administered 2018-02-05 – 2018-02-06 (×2): 40 mg via ORAL
  Filled 2018-02-04 (×2): qty 1

## 2018-02-04 MED ORDER — METHYLPREDNISOLONE SODIUM SUCC 40 MG IJ SOLR
40.0000 mg | Freq: Two times a day (BID) | INTRAMUSCULAR | Status: DC
  Administered 2018-02-04 – 2018-02-06 (×4): 40 mg via INTRAVENOUS
  Filled 2018-02-04 (×4): qty 1

## 2018-02-04 MED ORDER — DOCUSATE SODIUM 100 MG PO CAPS: 100.0000 mg | ORAL_CAPSULE | ORAL | Status: DC | PRN

## 2018-02-04 MED ORDER — SODIUM CHLORIDE 0.9 % IV BOLUS
1000.0000 mL | Freq: Once | INTRAVENOUS | Status: AC
  Administered 2018-02-04: 1000 mL via INTRAVENOUS

## 2018-02-04 MED ORDER — SODIUM CHLORIDE 0.9 % IV SOLN
500.0000 mg | INTRAVENOUS | Status: DC
  Administered 2018-02-05 – 2018-02-06 (×2): 500 mg via INTRAVENOUS
  Filled 2018-02-04 (×2): qty 500

## 2018-02-04 MED ORDER — ENOXAPARIN SODIUM 40 MG/0.4ML ~~LOC~~ SOLN
40.0000 mg | SUBCUTANEOUS | Status: DC
  Administered 2018-02-04 – 2018-02-05 (×2): 40 mg via SUBCUTANEOUS
  Filled 2018-02-04 (×2): qty 0.4

## 2018-02-04 MED ORDER — ACETAMINOPHEN 325 MG PO TABS: 650.0000 mg | ORAL_TABLET | Freq: Four times a day (QID) | ORAL | Status: DC | PRN

## 2018-02-04 MED ORDER — INSULIN ASPART 100 UNIT/ML ~~LOC~~ SOLN
0.0000 [IU] | Freq: Every day | SUBCUTANEOUS | Status: DC
  Administered 2018-02-05: 2 [IU] via SUBCUTANEOUS
  Administered 2018-02-05: 3 [IU] via SUBCUTANEOUS

## 2018-02-04 MED ORDER — ONDANSETRON HCL 4 MG PO TABS: 4.0000 mg | ORAL_TABLET | Freq: Four times a day (QID) | ORAL | Status: DC | PRN

## 2018-02-04 MED ORDER — METHYLPREDNISOLONE SODIUM SUCC 125 MG IJ SOLR
125.0000 mg | Freq: Once | INTRAMUSCULAR | Status: AC
  Administered 2018-02-04: 125 mg via INTRAVENOUS
  Filled 2018-02-04: qty 2

## 2018-02-04 MED ORDER — ASPIRIN EC 81 MG PO TBEC
81.0000 mg | DELAYED_RELEASE_TABLET | Freq: Every day | ORAL | Status: DC
  Administered 2018-02-04 – 2018-02-05 (×2): 81 mg via ORAL
  Filled 2018-02-04 (×2): qty 1

## 2018-02-04 MED ORDER — SODIUM CHLORIDE 0.9 % IV SOLN
INTRAVENOUS | Status: AC
  Administered 2018-02-04 – 2018-02-05 (×2): via INTRAVENOUS

## 2018-02-04 MED ORDER — SODIUM CHLORIDE 0.9% FLUSH: 3.0000 mL | INTRAVENOUS | Status: DC | PRN

## 2018-02-04 MED ORDER — ATORVASTATIN CALCIUM 10 MG PO TABS
10.0000 mg | ORAL_TABLET | Freq: Every day | ORAL | Status: DC
  Administered 2018-02-05: 10 mg via ORAL
  Filled 2018-02-04 (×2): qty 1

## 2018-02-04 MED ORDER — GUAIFENESIN ER 600 MG PO TB12
600.0000 mg | ORAL_TABLET | Freq: Two times a day (BID) | ORAL | Status: DC
  Administered 2018-02-04 – 2018-02-06 (×4): 600 mg via ORAL
  Filled 2018-02-04 (×4): qty 1

## 2018-02-04 MED ORDER — LEVOFLOXACIN IN D5W 750 MG/150ML IV SOLN
750.0000 mg | Freq: Once | INTRAVENOUS | Status: AC
  Administered 2018-02-04: 750 mg via INTRAVENOUS
  Filled 2018-02-04: qty 150

## 2018-02-04 MED ORDER — SODIUM CHLORIDE 0.9 % IV SOLN: 250.0000 mL | INTRAVENOUS | Status: DC | PRN

## 2018-02-04 MED ORDER — SODIUM CHLORIDE 0.9% FLUSH
3.0000 mL | Freq: Two times a day (BID) | INTRAVENOUS | Status: DC
  Administered 2018-02-04 – 2018-02-05 (×3): 3 mL via INTRAVENOUS

## 2018-02-04 MED ORDER — SODIUM CHLORIDE 0.9 % IV SOLN
1.0000 g | INTRAVENOUS | Status: DC
  Administered 2018-02-05 – 2018-02-06 (×2): 1 g via INTRAVENOUS
  Filled 2018-02-04 (×2): qty 1

## 2018-02-04 MED ORDER — PANTOPRAZOLE SODIUM 40 MG PO TBEC: 40.0000 mg | DELAYED_RELEASE_TABLET | Freq: Every day | ORAL | Status: DC

## 2018-02-04 MED ORDER — MAGNESIUM 200 MG PO TABS: 200.0000 mg | ORAL_TABLET | Freq: Every day | ORAL | Status: DC

## 2018-02-04 MED ORDER — MAGNESIUM OXIDE 400 (241.3 MG) MG PO TABS
200.0000 mg | ORAL_TABLET | Freq: Every day | ORAL | Status: DC
  Administered 2018-02-05 – 2018-02-06 (×2): 200 mg via ORAL
  Filled 2018-02-04 (×2): qty 1

## 2018-02-04 MED ORDER — SODIUM CHLORIDE 0.9 % IV SOLN
1000.0000 mL | INTRAVENOUS | Status: DC
  Administered 2018-02-04: 1000 mL via INTRAVENOUS

## 2018-02-04 MED ORDER — INSULIN ASPART 100 UNIT/ML ~~LOC~~ SOLN
0.0000 [IU] | Freq: Three times a day (TID) | SUBCUTANEOUS | Status: DC
  Administered 2018-02-04 – 2018-02-06 (×5): 5 [IU] via SUBCUTANEOUS
  Administered 2018-02-06: 11 [IU] via SUBCUTANEOUS

## 2018-02-04 NOTE — Progress Notes (Signed)
CRITICAL VALUE ALERT  Critical Value:  Lactic acid of 3.1   Date & Time Notied:  12/14 @ 1825  Provider Notified: [place]  Orders Received/Actions taken:

## 2018-02-04 NOTE — ED Notes (Signed)
I gave critical I Stat CG4 results to MD Ray

## 2018-02-04 NOTE — ED Notes (Signed)
Bed: WA02 Expected date:  Expected time:  Means of arrival:  Comments: 

## 2018-02-04 NOTE — H&P (Addendum)
TRH H&P   Patient Demographics:    Marcus Norton, is a 77 y.o. male  MRN: [id number]   DOB - 02-18-1941  Admit Date - 02/04/2018  Outpatient Primary MD for the patient is Delorse Limber  Referring MD: Dr. Tamera Punt  Outpatient Specialists: Cardiology/pulmonary  Patient coming from: Home  Chief Complaint  Patient presents with  . Shortness of Breath  . Cough      HPI:    Marcus Norton  is a 77 y.o. male, with history of coronary artery disease status post cath in 2018 and stenting, peripheral arterial disease, recently diagnosed OSA (saw pulmonary), hypertension, diabetes mellitus type 2 on insulin and oral hypoglycemic who was treated with a course of doxycycline about 2 weeks back by his pulmonologist for longstanding cough presented to many in [place] with 2-3 days of persistent cough with dark phlegm and some shortness of breath.  He also reported subjective chills and feeling feverish along with abdominal bloating.  He reports he was in a farmers convention in [place] for 3 days earlier this week where they were about 500-600 people and may have contracted some illness.  Reports some wheezing as well.  He reports mild headache but no blurry vision, feels weak but denies dizziness, chest pain, palpitations, nausea, vomiting or diarrhea.  He reports feeling bloated and not urinating as much as he should.  Denies any recent travel. At the mini clinic in [place] he was placed on oxygen and EMS was called.  He was also found to have low-grade fever.  Course in the ED Patient was found to be septic with fever of 101.1 F, tachycardic in low 100s, O2 sat improved to 92% on 2 L via nasal cannula.  WBC of 11 point 8K, sodium of 130, chloride of 93, BUN of 28, glucose of 196 and lactic acid elevated to 3.68.  Chest x-ray negative for any infiltrate.  Given concern for  acute bronchitis patient given empiric IV Solu-Medrol, Levaquin and IV fluids.  EKG showed normal sinus rhythm.  Flu PCR sent from the ED and hospitalist consulted for admission to telemetry for sepsis.      Review of systems:    In addition to the HPI above,  Fevers and chills No Headache, No changes with Vision or hearing, No problems swallowing food or Liquids, Cough with dark phlegm and shortness of breath.  No chest pain or palpitation Abdominal bloating, no abdominal pain, nausea or vomiting, Bowel movements are regular, No Blood in stool or Urine, No dysuria, No new skin rashes or bruises, No new joints pains-aches,  Weakness, tingling, numbness in any extremity, No recent weight gain or loss, No polyuria, polydypsia or polyphagia, No significant Mental Stressors.     With Past History of the following :    Past Medical History:  Diagnosis Date  . 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  Arthritis    "lower back" (07/29/2016)  . ASCVD (arteriosclerotic cardiovascular disease)    -luminal irregularities in 1998 and 2005; normal EF  . Benign prostatic hypertrophy    status post transurethral resection of the prostate  . CAD (coronary artery disease)    a. Cath 07/29/16 99% ostial RCA & 70% pRCA s/p cutting balloon angioplasty and single SYNERGY DES 2.5X28. 100% very Distal 1st RPLB lesion --> the occlusion site moved further distal with guidewire advancement;  40% ostial LAD; 50% Ost Cx to Prox Cx lesion.  . Coronary artery disease   . Dysphagia   . Erectile dysfunction   . GERD (gastroesophageal reflux disease)   . Heart murmur   . History of hiatal hernia   . History of kidney stones   . Hyperlipidemia   . Hypertension   . Seasonal allergies   . Tobacco abuse    20 pack years; discontinued 1995  . Type II diabetes mellitus ([place])       Past Surgical History:  Procedure Laterality Date  . APPENDECTOMY    . CATARACT EXTRACTION W/ INTRAOCULAR LENS  IMPLANT,  BILATERAL Bilateral   . COLONOSCOPY  07/20/2011   Dr. Gala Romney: multiple hyperplastic polyps. Surveillance 2018  . COLONOSCOPY N/A 06/30/2016   Procedure: COLONOSCOPY;  Surgeon: Daneil Dolin, MD;  [facility];  Service: Endoscopy;  Laterality: N/A;  10:00am  . COLONOSCOPY W/ BIOPSIES AND POLYPECTOMY  07/08/08, 06/2011   Dr Madolyn Frieze papilla, diminutive rectal polyp ablated the, left-sided diverticula, piecemeal polypectomy of hepatic flexure adenomatous polyp, diminutive polyps near hepatic flexure ablated  . CORONARY ANGIOPLASTY WITH STENT PLACEMENT  07/29/2016  . CORONARY STENT INTERVENTION N/A 07/29/2016   Procedure: Coronary Stent Intervention;  Surgeon: Leonie Man, MD;  Location: [facility];  Service: Cardiovascular;  Laterality: N/A;  RCA  . ESOPHAGOGASTRODUODENOSCOPY  2013   erosive reflux esophagitis, s/p empiric dilation. chronic duodenitis.   [name] LEFT HEART CATH AND CORONARY ANGIOGRAPHY N/A 07/29/2016   Procedure: Left Heart Cath and Coronary Angiography;  Surgeon: Leonie Man, MD;  Location: [facility];  Service: Cardiovascular;  Laterality: N/A;  . NASAL SEPTUM SURGERY    . NASAL SINUS SURGERY     "cleaned out my sinus"  . PERIPHERAL VASCULAR CATHETERIZATION N/A 11/26/2015   Procedure: Abdominal Aortogram w/Lower Extremity;  Surgeon: Wellington Hampshire, MD;  Location: [facility];  Service: Cardiovascular;  Laterality: N/A;  . TONSILLECTOMY    . TRANSURETHRAL RESECTION OF PROSTATE  2009      Social History:     Social History   Tobacco Use  . Smoking status: Former Smoker    Packs/day: 1.00    Years: 15.00    Pack years: 15.00    Types: Cigarettes    Start date: 03/08/1965    Last attempt to quit: 07/07/1993    Years since quitting: 24.5  . Smokeless tobacco: Never Used  Substance Use Topics  . Alcohol use: No    Alcohol/week: 0.0 standard drinks    Frequency: Never    Comment: 07/29/2016 "last drink was 4 wks ago; had a beer"      Lives -with wife Mobility -independent     Family History :     Family History  Problem Relation Age of Onset  . Ovarian cancer Mother   . Colon cancer Sister 53       deceased from colon cancer      Home Medications:  Prior to Admission medications   Medication Sig Start Date End Date Taking? Authorizing Provider  acetaminophen (TYLENOL) 325 MG tablet Take 325 mg by mouth 2 (two) times daily as needed for mild pain.   Yes [provider]  alfuzosin (UROXATRAL) 10 MG 24 hr tablet Take 10 mg by mouth daily with breakfast.   Yes [provider]  amLODipine (NORVASC) 5 MG tablet Take 1 tablet (5 mg total) by mouth daily. 12/20/17 03/20/18 Yes [name], PA-C  aspirin EC 81 MG tablet Take 81 mg by mouth at bedtime.    Yes [provider]  atorvastatin (LIPITOR) 10 MG tablet Take 1 tablet (10 mg total) by mouth daily. Patient taking differently: Take 10 mg by mouth at bedtime.  08/02/17  Yes Wellington Hampshire, MD  azelastine (ASTELIN) 0.1 % nasal spray Place 2 sprays into both nostrils 2 (two) times daily. Use in each nostril as directed 12/20/17  Yes [name], PA-C  diazepam (VALIUM) 5 MG tablet Take 1 tablet (5 mg total) by mouth at bedtime as needed. for sleep Patient taking differently: Take 2.5-5 mg by mouth at bedtime as needed (sleep).  11/29/17  Yes [name], PA-C  docusate sodium (COLACE) 100 MG capsule Take 100 mg by mouth as needed for mild constipation.    Yes [provider]  JANUVIA 100 MG tablet TAKE 1 TABLET(100 MG) BY MOUTH DAILY Patient taking differently: Take 100 mg by mouth daily.  12/27/17  Yes [name], PA-C  LANTUS SOLOSTAR 100 UNIT/ML Solostar Pen Inject 18 Units into the skin daily at 10 pm. Increase by 2 units every 3 days Patient taking differently: Inject 20 Units into the skin daily at 10 pm. Increase by 2 units every 3 days 07/20/17  Yes [name], PA-C  lisinopril  (PRINIVIL,ZESTRIL) 20 MG tablet TAKE 1 TABLET BY MOUTH DAILY Patient taking differently: Take 20 mg by mouth daily.  11/18/17  Yes Herminio Commons, MD  Magnesium 200 MG TABS Take 1 tablet (200 mg total) by mouth daily. 08/30/16  Yes Lendon Colonel, NP  metFORMIN (GLUCOPHAGE) 1000 MG tablet TAKE 1 TABLET(1000 MG) BY MOUTH TWICE DAILY WITH A MEAL Patient taking differently: Take 1,000 mg by mouth 2 (two) times daily with a meal.  11/09/17  Yes Leamon Arnt, MD  metoprolol tartrate (LOPRESSOR) 25 MG tablet TAKE 1 TABLET(25 MG) BY MOUTH TWICE DAILY Patient taking differently: Take 25 mg by mouth 2 (two) times daily.  11/18/17  Yes Herminio Commons, MD  pantoprazole (PROTONIX) 40 MG tablet Take 1 tablet (40 mg total) by mouth daily. 11/17/17  Yes [name], PA-C  rOPINIRole (REQUIP) 0.25 MG tablet TAKE 1 TABLET BY MOUTH EVERY NIGHT FOR 2 NIGHTS, 2 TABLETS X 5 NIGHTS THEN 4 TABLETS EVERY NIGHT Patient taking differently: Take 0.25-1 mg by mouth See admin instructions. Take 0.25 mg by mouth every night for 2 days, 0.5 mg by mouth for 5 nights then 1 mg every night 02/01/18  Yes Olalere, Adewale A, MD  tadalafil (CIALIS) 20 MG tablet Take 0.5-1 tablets (10-20 mg total) by mouth every other day as needed for erectile dysfunction. 03/08/17  Yes [name], PA-C  ACCU-CHEK AVIVA PLUS test strip USE TO TEST BLOOD SUAGR TWICE DAILY 11/30/17   [name], PA-C  doxycycline (VIBRA-TABS) 100 MG tablet Take 1 tablet (100 mg total) by mouth 2 (two) times daily. Patient not taking: Reported on 02/04/2018  01/12/18   Olalere, Adewale A, MD  glucose blood test strip Accu-Chek Aviva Plus test strips    [provider]  Insulin Pen Needle (B-D ULTRAFINE III SHORT PEN) 31G X 8 MM MISC Inject insulin SQ daily Dx:E11.8 11/22/17   [name], PA-C  ipratropium (ATROVENT) 0.03 % nasal spray Place 2 sprays into both nostrils every 12 (twelve) hours. Patient not taking: Reported on  02/04/2018 01/12/18   Laurin Coder, MD  metFORMIN (GLUCOPHAGE) 1000 MG tablet TAKE 1 TABLET(1000 MG) BY MOUTH TWICE DAILY WITH A MEAL Patient not taking: Reported on 02/04/2018 01/30/18   [name], PA-C  rOPINIRole (REQUIP) 0.5 MG tablet 0.25 mg nightly for 2 days Then increase to 0.5 mg nightly for 5 days Then 1 mg nightly Patient not taking: Reported on 02/04/2018 01/27/18   Laurin Coder, MD     Allergies:     Allergies  Allergen Reactions  . Plavix [Clopidogrel Bisulfate] Shortness Of Breath  . Oxycontin [Oxycodone Hcl] Other (See Comments)    Malaise  . Penicillins Other (See Comments)    [place] Has patient had a PCN reaction causing immediate rash, facial/tongue/throat swelling, SOB or lightheadedness with hypotension: No Has patient had a PCN reaction causing severe rash involving mucus membranes or skin necrosis: No Has patient had a PCN reaction that required hospitalization No Has patient had a PCN reaction occurring within the last 10 years: No If all of the above answers are "NO", then may proceed with Cephalosporin use.   . Pioglitazone Other (See Comments)    Made his chest hurt  . Prednisone Other (See Comments)    Reaction: muscle aches and soreness  . Simvastatin Other (See Comments)    Myalgias     Physical Exam:   Vitals  Pulse (!) 103, temperature (!) 101.1 F (38.4 C), temperature source Rectal, resp. rate 18, SpO2 94 %.   General: Elderly male lying in bed appears congested and fatigued HEENT: Conjunctival congestion, no icterus, dry mucosa, supple neck, no cervical lymphadenopathy Chest: Scattered rhonchi and coarse breath sounds bilaterally CVS: Normal S1-S2, no murmurs rub or gallop GI: Soft, mild abdominal distention, nontender, bowel sounds present Musculoskeletal: Warm, no edema CNS: Alert and oriented, nonfocal   Data Review:    CBC Recent Labs  Lab 02/04/18 1240  WBC 11.8*  HGB 13.5  HCT 41.6  PLT 250    MCV 102.2*  MCH 33.2  MCHC 32.5  RDW 12.6  LYMPHSABS 1.1  MONOABS 1.5*  EOSABS 0.0  BASOSABS 0.1   ------------------------------------------------------------------------------------------------------------------  Chemistries  Recent Labs  Lab 02/04/18 1240  NA 130*  K 4.5  CL 93*  CO2 24  GLUCOSE 196*  BUN 28*  CREATININE 0.74  CALCIUM 9.2  AST 19  ALT 20  ALKPHOS 49  BILITOT 1.4*   ------------------------------------------------------------------------------------------------------------------ estimated creatinine clearance is 78 mL/min (by C-G formula based on SCr of 0.74 mg/dL). ------------------------------------------------------------------------------------------------------------------ No results for input(s): TSH, T4TOTAL, T3FREE, THYROIDAB in the last 72 hours.  Invalid input(s): FREET3  Coagulation profile No results for input(s): INR, PROTIME in the last 168 hours. ------------------------------------------------------------------------------------------------------------------- No results for input(s): DDIMER in the last 72 hours. -------------------------------------------------------------------------------------------------------------------  Cardiac Enzymes No results for input(s): CKMB, TROPONINI, MYOGLOBIN in the last 168 hours.  Invalid input(s): CK ------------------------------------------------------------------------------------------------------------------ No results found for: BNP   ---------------------------------------------------------------------------------------------------------------  Urinalysis    Component Value Date/Time   COLORURINE YELLOW 03/04/2016 [place] 03/04/2016 1029   LABSPEC 1.015  03/04/2016 1029   PHURINE 5.5 03/04/2016 1029   GLUCOSEU >=1000 (A) 03/04/2016 1029   HGBUR NEGATIVE 03/04/2016 1029   BILIRUBINUR negative 03/08/2017 0916   KETONESUR NEGATIVE 03/04/2016 1029   PROTEINUR  negative 03/08/2017 0916   PROTEINUR NEGATIVE 12/08/2015 1635   UROBILINOGEN 2.0 (A) 03/08/2017 0916   UROBILINOGEN 0.2 03/04/2016 1029   NITRITE negative 03/08/2017 0916   NITRITE NEGATIVE 03/04/2016 1029   LEUKOCYTESUR Negative 03/08/2017 0916    ----------------------------------------------------------------------------------------------------------------   Imaging Results:    Dg Chest Port 1 View  Result Date: 02/04/2018 CLINICAL DATA:  Productive cough, shortness of breath. EXAM: PORTABLE CHEST 1 VIEW COMPARISON:  Radiographs of January 12, 2018. FINDINGS: The heart size and mediastinal contours are within normal limits. Both lungs are clear. No pneumothorax or pleural effusion is noted. Atherosclerosis of thoracic aorta is noted. The visualized skeletal structures are unremarkable. IMPRESSION: No acute cardiopulmonary abnormality seen. Aortic Atherosclerosis (ICD10-I70.0). Electronically Signed   By: Marijo Conception, M.D.   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  Flu PCR sent from the ED.  Admit to telemetry.  Received Levaquin in the ED.  We will place on empiric Rocephin and azithromycin for acute bronchitis.  Check procalcitonin, blood cultures and subsequent lactic acid. Does not have underlying diagnosis of COPD although has history of remote smoking and chronic cough for several weeks.  Will place on IV Solu-Medrol 40 mg every 12 hours, scheduled albuterol neb and antitussive.  Supportive care O2 via nasal cannula, 2 L with Tylenol and antiemetics. Subsequent lactic acid further elevated to 3.68.  Monitor with IV hydration.  Hold metformin.   Active Problems: Acute respiratory failure with hypoxia (HCC) Maintaining sats on mid 90s on 2 L via nasal cannula.  Secondary to acute  bronchitis.  Monitor closely with IV steroid, antibiotic and PRN nebs.    Insulin dependent diabetes mellitus with complications (HCC) On Lantus, metformin and Januvia.  Reports his last A1c was in the 7s.  Monitor CBC closely while on steroids.  Resume home dose Lantus with sliding scale coverage.  Hold metformin and Januvia.  GERD Continue PPI.  Complains of abdominal bloating.  Will add some Maalox as well.  Obstructive sleep apnea Followed by pulmonary as outpatient and reports having sleep study done.  Informs that his pulmonologist wanted to monitor with Requip for possible restless leg and if not improved plan on starting CPAP.  Hyponatremia Suspect prerenal with dehydration.  Monitor with IV fluids  Essential hypertension  Blood pressure stable.  Resume beta-blocker.  Will hold amlodipine and ACE inhibitor.  Coronary artery disease status post stenting/peripheral arterial disease Continue with aspirin and statin    DVT Prophylaxis: Subcu Lovenox  AM Labs Ordered, also please review Full Orders  Family Communication: Admission, patients condition and plan of care including tests being ordered have been discussed with the patient, his wife and daughter at bedside  Code Status full code  Likely DC to home once improved  Condition GUARDED   Consults called: None  Admission status: Inpatient Patient presenting with sepsis with high fever, tachycardia, tachypnea acute respiratory failure with hypoxia with possible underlying bronchitis and flulike symptoms, persistently elevated lactic acid.  Patient will require >2 midnight to be monitored for his sepsis, empiric antibiotic and steroid use, scheduled  nebs while currently oxygen dependent.  Time spent in minutes : 70   Aylah Yeary M.D on 02/04/2018 at 2:37 PM  Between 7am to 7pm - Pager - [phone]. After 7pm go to [email or web address] - password [facility]  [facility] - Office  [phone]

## 2018-02-04 NOTE — ED Provider Notes (Signed)
[facility] DEPT Provider Note   CSN: [id number] Arrival date & time: 02/04/18  1145     History   Chief Complaint Chief Complaint  Patient presents with  . Shortness of Breath  . Cough    HPI Barnard ACHILLIES BUEHL is a 77 y.o. male.  HPI  77 yo male ho t2dm presents today with ongoing cough  now with worsening symptoms for 4 days.  Cough more severe and productive of thick green sputum. Patient went to mini clinic in [place] and and called EMS for transport.  Patient now on oxygen which helped with dyspnea.  Patient told he had low grade fever at mini clinic.  Denies lung disease, smoked for 16 years.    Past Medical History:  Diagnosis Date  . 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 Arthritis    "lower back" (07/29/2016)  . ASCVD (arteriosclerotic cardiovascular disease)    -luminal irregularities in 1998 and 2005; normal EF  . Benign prostatic hypertrophy    status post transurethral resection of the prostate  . CAD (coronary artery disease)    a. Cath 07/29/16 99% ostial RCA & 70% pRCA s/p cutting balloon angioplasty and single SYNERGY DES 2.5X28. 100% very Distal 1st RPLB lesion --> the occlusion site moved further distal with guidewire advancement;  40% ostial LAD; 50% Ost Cx to Prox Cx lesion.  . Coronary artery disease   . Dysphagia   . Erectile dysfunction   . GERD (gastroesophageal reflux disease)   . Heart murmur   . History of hiatal hernia   . History of kidney stones   . Hyperlipidemia   . Hypertension   . Seasonal allergies   . Tobacco abuse    20 pack years; discontinued 1995  . Type II diabetes mellitus [facility])     Patient Active Problem List   Diagnosis Date Noted  . Chronic cough 12/20/2017  . CAD S/P percutaneous coronary angioplasty 07/20/2017  . Exertional dyspnea 07/15/2017  . GERD (gastroesophageal reflux disease) 04/14/2017  . Hematuria 03/08/2017  . Encounter for Medicare annual wellness exam 03/08/2017  . Need  for pneumococcal vaccination 03/08/2017  . Status post coronary artery stent placement   . Atypical angina ([place]) 07/29/2016  . Abnormal nuclear stress test 07/29/2016  . Angina pectoris ([place])   . Dyspepsia 07/12/2016  . Constipation 05/12/2016  . History of adenomatous polyp of colon 05/12/2016  . Generalized anxiety disorder 03/04/2016  . PAD (peripheral artery disease) ([place]) 02/26/2014  . Arteriosclerotic cardiovascular disease -nonobstructive   . Insulin dependent diabetes mellitus with complications ([place])   . Essential hypertension   . History of tobacco abuse   . Hyperlipidemia 07/11/2009  . 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 Abnormal liver function tests 07/11/2009    Past Surgical History:  Procedure Laterality Date  . APPENDECTOMY    . CATARACT EXTRACTION W/ INTRAOCULAR LENS  IMPLANT, BILATERAL Bilateral   . COLONOSCOPY  07/20/2011   Dr. Gala Romney: multiple hyperplastic polyps. Surveillance 2018  . COLONOSCOPY N/A 06/30/2016   Procedure: COLONOSCOPY;  Surgeon: Daneil Dolin, MD;  Location[facility];  Service: Endoscopy;  Laterality: N/A;  10:00am  . COLONOSCOPY W/ BIOPSIES AND POLYPECTOMY  07/08/08, 06/2011   Dr Madolyn Frieze papilla, diminutive rectal polyp ablated the, left-sided diverticula, piecemeal polypectomy of hepatic flexure adenomatous polyp, diminutive polyps near hepatic flexure ablated  . CORONARY ANGIOPLASTY WITH STENT PLACEMENT  07/29/2016  . CORONARY STENT INTERVENTION N/A 07/29/2016   Procedure: Coronary Stent Intervention;  Surgeon:  Leonie Man, MD;  Location: [facility];  Service: Cardiovascular;  Laterality: N/A;  RCA  . ESOPHAGOGASTRODUODENOSCOPY  2013   erosive reflux esophagitis, s/p empiric dilation. chronic duodenitis.   [name] LEFT HEART CATH AND CORONARY ANGIOGRAPHY N/A 07/29/2016   Procedure: Left Heart Cath and Coronary Angiography;  Surgeon: Leonie Man, MD;  Location: [facility];  Service: Cardiovascular;  Laterality:  N/A;  . NASAL SEPTUM SURGERY    . NASAL SINUS SURGERY     "cleaned out my sinus"  . PERIPHERAL VASCULAR CATHETERIZATION N/A 11/26/2015   Procedure: Abdominal Aortogram w/Lower Extremity;  Surgeon: Wellington Hampshire, MD;  Location: [facility];  Service: Cardiovascular;  Laterality: N/A;  . TONSILLECTOMY    . TRANSURETHRAL RESECTION OF PROSTATE  2009        Home Medications    Prior to Admission medications   Medication Sig Start Date End Date Taking? Authorizing Provider  ACCU-CHEK AVIVA PLUS test strip USE TO TEST BLOOD SUAGR TWICE DAILY 11/30/17   [name], PA-C  acetaminophen (TYLENOL) 325 MG tablet Take 325 mg by mouth 2 (two) times daily as needed for mild pain.    [provider]  alfuzosin (UROXATRAL) 10 MG 24 hr tablet Take 10 mg by mouth daily with breakfast.    [provider]  amLODipine (NORVASC) 5 MG tablet Take 1 tablet (5 mg total) by mouth daily. 12/20/17 03/20/18  [name], PA-C  aspirin EC 81 MG tablet Take 81 mg by mouth at bedtime.     [provider]  atorvastatin (LIPITOR) 10 MG tablet Take 1 tablet (10 mg total) by mouth daily. 08/02/17   Wellington Hampshire, MD  azelastine (ASTELIN) 0.1 % nasal spray Place 2 sprays into both nostrils 2 (two) times daily. Use in each nostril as directed 12/20/17   [name], PA-C  Cyanocobalamin (VITAMIN B 12 PO) Take 2,000 Units by mouth daily.     [provider]  diazepam (VALIUM) 5 MG tablet Take 1 tablet (5 mg total) by mouth at bedtime as needed. for sleep 11/29/17   [name], PA-C  docusate sodium (COLACE) 100 MG capsule Take 100 mg by mouth as needed for mild constipation.     [provider]  doxycycline (VIBRA-TABS) 100 MG tablet Take 1 tablet (100 mg total) by mouth 2 (two) times daily. 01/12/18   Olalere, Adewale A, MD  glucose blood test strip Accu-Chek Aviva Plus test strips    [provider]  Insulin Pen Needle (B-D ULTRAFINE III  SHORT PEN) 31G X 8 MM MISC Inject insulin SQ daily Dx:E11.8 11/22/17   [name], PA-C  ipratropium (ATROVENT) 0.03 % nasal spray Place 2 sprays into both nostrils every 12 (twelve) hours. 01/12/18   Laurin Coder, MD  JANUVIA 100 MG tablet TAKE 1 TABLET(100 MG) BY MOUTH DAILY 12/27/17   [name], PA-C  LANTUS SOLOSTAR 100 UNIT/ML Solostar Pen Inject 18 Units into the skin daily at 10 pm. Increase by 2 units every 3 days 07/20/17   [name], PA-C  lisinopril (PRINIVIL,ZESTRIL) 20 MG tablet TAKE 1 TABLET BY MOUTH DAILY 11/18/17   Herminio Commons, MD  loratadine (CLARITIN) 10 MG tablet Take 10 mg by mouth daily as needed for allergies.     [provider]  Magnesium 200 MG TABS Take 1 tablet (200 mg total) by mouth daily. 08/30/16   Lendon Colonel,  NP  metFORMIN (GLUCOPHAGE) 1000 MG tablet TAKE 1 TABLET(1000 MG) BY MOUTH TWICE DAILY WITH A MEAL 11/09/17   Leamon Arnt, MD  metFORMIN (GLUCOPHAGE) 1000 MG tablet TAKE 1 TABLET(1000 MG) BY MOUTH TWICE DAILY WITH A MEAL 01/30/18   [name], PA-C  metoprolol tartrate (LOPRESSOR) 25 MG tablet TAKE 1 TABLET(25 MG) BY MOUTH TWICE DAILY 11/18/17   Herminio Commons, MD  pantoprazole (PROTONIX) 40 MG tablet Take 1 tablet (40 mg total) by mouth daily. 11/17/17   [name], PA-C  polyethylene glycol [facility] / GLYCOLAX) packet Take 17 g by mouth every other day.     [provider]  rOPINIRole (REQUIP) 0.25 MG tablet TAKE 1 TABLET BY MOUTH EVERY NIGHT FOR 2 NIGHTS, 2 TABLETS X 5 NIGHTS THEN 4 TABLETS EVERY NIGHT 02/01/18   Olalere, Adewale A, MD  rOPINIRole (REQUIP) 0.5 MG tablet 0.25 mg nightly for 2 days Then increase to 0.5 mg nightly for 5 days Then 1 mg nightly 01/27/18   Olalere, Adewale A, MD  tadalafil (CIALIS) 20 MG tablet Take 0.5-1 tablets (10-20 mg total) by mouth every other day as needed for erectile dysfunction. 03/08/17   [name], PA-C    Family History Family  History  Problem Relation Age of Onset  . Ovarian cancer Mother   . Colon cancer Sister 51       deceased from colon cancer    Social History Social History   Tobacco Use  . Smoking status: Former Smoker    Packs/day: 1.00    Years: 15.00    Pack years: 15.00    Types: Cigarettes    Start date: 03/08/1965    Last attempt to quit: 07/07/1993    Years since quitting: 24.5  . Smokeless tobacco: Never Used  Substance Use Topics  . Alcohol use: No    Alcohol/week: 0.0 standard drinks    Frequency: Never    Comment: 07/29/2016 "last drink was 4 wks ago; had a beer"  . Drug use: No     Allergies   Plavix [clopidogrel bisulfate]; Oxycontin [oxycodone hcl]; Penicillins; Pioglitazone; Prednisone; and Simvastatin   Review of Systems Review of Systems  Constitutional: Positive for appetite change, chills and fatigue. Negative for fever.  HENT: Positive for congestion.   Eyes: Negative.   Respiratory: Positive for cough and shortness of breath.   Cardiovascular: Positive for chest pain.       States had a little the other morning  Gastrointestinal: Positive for nausea. Negative for diarrhea and vomiting.  Endocrine: Negative.   Musculoskeletal: Negative.   Skin: Negative.   Neurological: Positive for light-headedness.  Hematological: Negative.   Psychiatric/Behavioral: Negative.   All other systems reviewed and are negative.    Physical Exam Updated Vital Signs There were no vitals taken for this visit.  Physical Exam Vitals signs and nursing note reviewed.  Constitutional:      Appearance: He is well-developed. He is ill-appearing.  HENT:     Head: Normocephalic.     Mouth/Throat:     Mouth: Mucous membranes are moist.  Eyes:     Pupils: Pupils are equal, round, and reactive to light.  Neck:     Musculoskeletal: Normal range of motion.  Cardiovascular:     Rate and Rhythm: Tachycardia present.  Pulmonary:     Effort: Tachypnea and respiratory distress present.    Chest:     Chest wall: No mass or deformity.  Abdominal:  General: Bowel sounds are normal.     Palpations: Abdomen is soft.     Comments: Abdomen is diffusely mildly distended but no tenderness is palpated  Musculoskeletal: Normal range of motion.     Right lower leg: No edema.     Left lower leg: No edema.  Skin:    General: Skin is warm and dry.     Capillary Refill: Capillary refill takes less than 2 seconds.  Neurological:     General: No focal deficit present.     Mental Status: He is alert and oriented to person, place, and time.  Psychiatric:        Mood and Affect: Mood normal.        Behavior: Behavior normal.      ED Treatments / Results  Labs (all labs ordered are listed, but only abnormal results are displayed) Labs Reviewed - No data to display  EKG EKG Interpretation  Date/Time:  Saturday February 04 2018 12:43:56 EST Ventricular Rate:  105 PR Interval:    QRS Duration: 103 QT Interval:  348 QTC Calculation: 460 R Axis:   -58 Text Interpretation:  Sinus tachycardia Probable left atrial enlargement Incomplete RBBB and LAFB Confirmed by Pattricia Boss [phone]) on 02/04/2018 1:57:59 PM   Radiology Dg Chest Port 1 View  Result Date: 02/04/2018 CLINICAL DATA:  Productive cough, shortness of breath. EXAM: PORTABLE CHEST 1 VIEW COMPARISON:  Radiographs of January 12, 2018. FINDINGS: The heart size and mediastinal contours are within normal limits. Both lungs are clear. No pneumothorax or pleural effusion is noted. Atherosclerosis of thoracic aorta is noted. The visualized skeletal structures are unremarkable. IMPRESSION: No acute cardiopulmonary abnormality seen. Aortic Atherosclerosis (ICD10-I70.0). Electronically Signed   By: Marijo Conception, M.D.   On: 02/04/2018 12:40    Procedures Procedures (including critical care time)  Medications Ordered in ED Medications - No data to display   Initial Impression / Assessment and Plan / ED Course  I have  reviewed the triage vital signs and the nursing notes.  Pertinent labs & imaging results that were available during my care of the patient were reviewed by me and considered in my medical decision making (see chart for details).     pmd [place] 77 year old male presents today with upper respiratory infections, cough, and dyspnea.  Here he has been on oxygen and sats have been in the mid 90s.  Was treated here as COPD exacerbation with albuterol, Solu-Medrol, and Levaquin.  No focal infiltrates are seen on chest x-Junell Cullifer.  InitialLactic acid is mildly elevated lactic acid is mildly elevated at 2.81. Final Clinical Impressions(s) / ED Diagnoses   Final diagnoses:  None    ED Discharge Orders    None       Pattricia Boss, MD 02/05/18 1547

## 2018-02-04 NOTE — ED Triage Notes (Signed)
Saw for URI a month ago. Patient still has cough with SOBr. Reports brown sputum w/ productive cough. bil ronchi, o2 sats were 85- 92, EMS placed on 3L and came up to 93%

## 2018-02-04 NOTE — Progress Notes (Signed)
CRITICAL VALUE ALERT  Critical Value: Lactic of 3.3  Date & Time Notied:  12/14 @2022   Provider Notified: Blout  Orders Received/Actions taken:

## 2018-02-05 LAB — CBC
HCT: 37.3 % — ABNORMAL LOW (ref 39.0–52.0)
Hemoglobin: 12.1 g/dL — ABNORMAL LOW (ref 13.0–17.0)
MCH: 33 pg (ref 26.0–34.0)
MCHC: 32.4 g/dL (ref 30.0–36.0)
MCV: 101.6 fL — ABNORMAL HIGH (ref 80.0–100.0)
Platelets: 242 10*3/uL (ref 150–400)
RBC: 3.67 MIL/uL — AB (ref 4.22–5.81)
RDW: 12.6 % (ref 11.5–15.5)
WBC: 7.1 10*3/uL (ref 4.0–10.5)
nRBC: 0 % (ref 0.0–0.2)

## 2018-02-05 LAB — BASIC METABOLIC PANEL
Anion gap: 9 (ref 5–15)
BUN: 20 mg/dL (ref 8–23)
CO2: 24 mmol/L (ref 22–32)
Calcium: 8.4 mg/dL — ABNORMAL LOW (ref 8.9–10.3)
Chloride: 105 mmol/L (ref 98–111)
Creatinine, Ser: 0.65 mg/dL (ref 0.61–1.24)
GFR calc Af Amer: >60 mL/min (ref >60)
GFR calc non Af Amer: >60 mL/min (ref >60)
Glucose, Bld: 204 mg/dL — ABNORMAL HIGH (ref 70–99)
Potassium: 4.3 mmol/L (ref 3.5–5.1)
Sodium: 138 mmol/L (ref 135–145)

## 2018-02-05 LAB — GLUCOSE, CAPILLARY
Glucose-Capillary: 216 mg/dL — ABNORMAL HIGH (ref 70–99)
Glucose-Capillary: 201 mg/dL — ABNORMAL HIGH (ref 70–99)

## 2018-02-05 NOTE — Progress Notes (Signed)
Patient up to sink to wash off, O2 removed.  Patient maintaining sats at 93-94% while on RA.  O2 left off at this time, will continue to monitor

## 2018-02-05 NOTE — Progress Notes (Signed)
[facility]                                                                              Patient Demographics  Marcus Norton, is a 77 y.o. male, DOB - June 22, 1940, [place] date - 02/04/2018   Admitting Physician No admitting provider for patient encounter.  Outpatient Primary MD for the patient is Delorse Limber  Outpatient specialists:   LOS - 1  days    Chief Complaint  Patient presents with  . Shortness of Breath  . Cough       Brief summary   Marcus Norton  is a 77 y.o. male, with medical history significant for but not limited to coronary artery disease status post cath in 2018 and stenting, peripheral arterial disease, hypertension, diabetes mellitus type 2 on insulin and oral hypoglycemic presenting with 2 to 3 days history of persistent cough productive of dark sputum and shortness of breath associated with fever and chills with wheezing.  Has been treated with doxycycline as outpatient for presumptive bronchitis without any improvement.  In the ED patient noted to be febrile temperature of 101.1 F, tachycardic, hypoxic with leukocytosis and elevated lactic acid.  Cystoscopy was negative and was admitted for sepsis pulmonary source  Assessment & Plan    Active Problems:   Insulin dependent diabetes mellitus with complications (HCC)   Essential hypertension   PAD (peripheral artery disease) (HCC)   Generalized anxiety disorder   Status post coronary artery stent placement   GERD (gastroesophageal reflux disease)   Severe sepsis (HCC)   Acute bronchitis   Hyponatremia   Severe sepsis ([place]) Secondary to acute bronchitis   Flu PCR negative.  Continue Rocephin and azithromycin for acute bronchitis.   Follow-up blood culture. No history of underlying diagnosis of COPD although has history of remote smoking and chronic cough for several weeks -continue IV Solu-Medrol 40 mg every 12 hours, scheduled albuterol neb and  antitussive.   Supportive care O2 via nasal cannula, 2 L with Tylenol and antiemetics. Subsequent lactic acid further elevated to 3.68.  Monitor with IV hydration.  Metformin on hold for now   Acute respiratory failure with hypoxia (HCC) Maintaining sats on mid 90s on 2 L via nasal cannula.   Secondary to acute bronchitis.   Monitor closely with IV steroid, antibiotic and PRN nebs.  Insulin dependent diabetes mellitus with complications (HCC) On Lantus, metformin and Januvia.  Insulin coverage for now. Metformin and Januvia on hold for now  GERD Continue PPI.  Complains of abdominal bloating.  Will add some Maalox as well.  Obstructive sleep apnea Followed by pulmonary as outpatient and reports having sleep study done.  Informs that his pulmonologist wanted to monitor with Requip for possible restless leg and if not improved plan on starting CPAP.  Outpatient follow-up discharge  Hyponatremia Suspect prerenal with dehydration.  Monitor with IV fluids  Essential hypertension  Blood pressure stable.  Resume beta-blocker.  Will hold amlodipine and ACE inhibitor.  Coronary artery disease status post stenting/peripheral arterial disease Continue with aspirin and statin   Code Status: Full code DVT Prophylaxis:  Lovenox  Family Communication: Discussed in detail with the patient, all imaging results, lab results explained to the patient   Disposition Plan: Home  Time Spent in minutes 32 minutes  Procedures:   Consultants:    Antimicrobials:      Medications  Scheduled Meds: . albuterol  2.5 mg Nebulization TID  . alfuzosin  10 mg Oral Q breakfast  . aspirin EC  81 mg Oral QHS  . atorvastatin  10 mg Oral q1800  . azelastine  2 spray Each Nare BID  . enoxaparin (LOVENOX) injection  40 mg Subcutaneous Q24H  . guaiFENesin  600 mg Oral BID  . insulin aspart  0-15 Units Subcutaneous TID WC  . insulin aspart  0-5 Units Subcutaneous QHS  . insulin glargine  20  Units Subcutaneous QHS  . magnesium oxide  200 mg Oral Daily  . mouth rinse  15 mL Mouth Rinse BID  . methylPREDNISolone (SOLU-MEDROL) injection  40 mg Intravenous Q12H  . metoprolol tartrate  25 mg Oral BID  . pantoprazole  40 mg Oral Daily  . rOPINIRole  0.5 mg Oral QHS   Followed by  . [START ON 02/08/2018] rOPINIRole  1 mg Oral QHS  . sodium chloride flush  3 mL Intravenous Q12H   Continuous Infusions: . sodium chloride    . sodium chloride 100 mL/hr at 02/05/18 0626  . azithromycin    . cefTRIAXone (ROCEPHIN)  IV 1 g (02/05/18 1203)   PRN Meds:.sodium chloride, acetaminophen **OR** acetaminophen, diazepam, docusate sodium, guaiFENesin-dextromethorphan, ondansetron **OR** ondansetron (ZOFRAN) IV, sodium chloride flush   Antibiotics   Anti-infectives (From admission, onward)   Start     Dose/Rate Route Frequency Ordered Stop   02/05/18 1400  cefTRIAXone (ROCEPHIN) 1 g in sodium chloride 0.9 % 100 mL IVPB     1 g 200 mL/hr over 30 Minutes Intravenous Every 24 hours 02/04/18 1527     02/05/18 1400  azithromycin (ZITHROMAX) 500 mg in sodium chloride 0.9 % 250 mL IVPB     500 mg 250 mL/hr over 60 Minutes Intravenous Every 24 hours 02/04/18 1527     02/04/18 1345  levofloxacin (LEVAQUIN) IVPB 750 mg     750 mg 100 mL/hr over 90 Minutes Intravenous  Once 02/04/18 1334 02/04/18 1610        Subjective:   Marcus Norton was seen and examined today.  Patient denies dizziness, chest pain.  His shortness of breath is a lot better.  No fever or chills today  Objective:   Vitals:   02/04/18 2053 02/05/18 0633 02/05/18 0742 02/05/18 1100  BP:  (!) 152/66    Pulse:  90 91   Resp:  (!) 22 18   Temp:  (!) 97.3 F (36.3 C)    TempSrc:  Oral    SpO2: 93% 98% 99% 94%  Weight:      Height:        Intake/Output Summary (Last 24 hours) at 02/05/2018 1238 Last data filed at 02/05/2018 1008 Gross per 24 hour  Intake 4444.44 ml  Output 1850 ml  Net 2594.44 ml     Wt Readings  from Last 3 Encounters:  02/04/18 82 kg  01/27/18 82.1 kg  01/12/18 82.1 kg     Exam  General: NAD  HEENT: NCAT,  PERRL,MMM  Neck: SUPPLE, (-) JVD  Cardiovascular: RRR, (-) GALLOP, (-) MURMUR  Respiratory: Few occasional bilateral rhonchi  Gastrointestinal: SOFT, (-) DISTENSION, BS(+), (_) TENDERNESS  Ext: (-) CYANOSIS, (-) EDEMA  Neuro: A,  OX 3  Skin:(-) RASH  Psych:NORMAL AFFECT/MOOD   Data Reviewed:  I have personally reviewed following labs and imaging studies  Micro Results No results found for this or any previous visit (from the past 240 hour(s)).  Radiology Reports Dg Chest 2 View  Result Date: 01/12/2018 CLINICAL DATA:  Cough EXAM: CHEST - 2 VIEW COMPARISON:  04/30/2016 FINDINGS: The heart size and mediastinal contours are within normal limits. Coronary stent. Atherosclerotic calcification aortic arch. Both lungs are clear. The visualized skeletal structures are unremarkable. IMPRESSION: No active cardiopulmonary disease. Electronically Signed   By: Franchot Gallo M.D.   On: 01/12/2018 13:06   Dg Chest Port 1 View  Result Date: 02/04/2018 CLINICAL DATA:  Productive cough, shortness of breath. EXAM: PORTABLE CHEST 1 VIEW COMPARISON:  Radiographs of January 12, 2018. FINDINGS: The heart size and mediastinal contours are within normal limits. Both lungs are clear. No pneumothorax or pleural effusion is noted. Atherosclerosis of thoracic aorta is noted. The visualized skeletal structures are unremarkable. IMPRESSION: No acute cardiopulmonary abnormality seen. Aortic Atherosclerosis (ICD10-I70.0). Electronically Signed   By: Marijo Conception, M.D.   On: 02/04/2018 12:40    Lab Data:  CBC: Recent Labs  Lab 02/04/18 1240 02/04/18 1643 02/05/18 0610  WBC 11.8* 10.1 7.1  NEUTROABS 9.1*  --   --   HGB 13.5 12.5* 12.1*  HCT 41.6 38.3* 37.3*  MCV 102.2* 103.2* 101.6*  PLT 250 221 527   Basic Metabolic Panel: Recent Labs  Lab 02/04/18 1240 02/04/18 1643  02/05/18 0610  NA 130*  --  138  K 4.5  --  4.3  CL 93*  --  105  CO2 24  --  24  GLUCOSE 196*  --  204*  BUN 28*  --  20  CREATININE 0.74 0.77 0.65  CALCIUM 9.2  --  8.4*   GFR: Estimated Creatinine Clearance: 78 mL/min (by C-G formula based on SCr of 0.65 mg/dL). Liver Function Tests: Recent Labs  Lab 02/04/18 1240  AST 19  ALT 20  ALKPHOS 49  BILITOT 1.4*  PROT 7.4  ALBUMIN 4.1   No results for input(s): LIPASE, AMYLASE in the last 168 hours. No results for input(s): AMMONIA in the last 168 hours. Coagulation Profile: No results for input(s): INR, PROTIME in the last 168 hours. Cardiac Enzymes: No results for input(s): CKTOTAL, CKMB, CKMBINDEX, TROPONINI in the last 168 hours. BNP (last 3 results) No results for input(s): PROBNP in the last 8760 hours. HbA1C: No results for input(s): HGBA1C in the last 72 hours. CBG: Recent Labs  Lab 02/04/18 1644 02/05/18 0803 02/05/18 1149  GLUCAP 202* 216* 201*   Lipid Profile: No results for input(s): CHOL, HDL, LDLCALC, TRIG, CHOLHDL, LDLDIRECT in the last 72 hours. Thyroid Function Tests: No results for input(s): TSH, T4TOTAL, FREET4, T3FREE, THYROIDAB in the last 72 hours. Anemia Panel: No results for input(s): VITAMINB12, FOLATE, FERRITIN, TIBC, IRON, RETICCTPCT in the last 72 hours. Urine analysis:    Component Value Date/Time   COLORURINE YELLOW 02/04/2018 [place] 02/04/2018 1447   LABSPEC 1.016 02/04/2018 1447   PHURINE 5.0 02/04/2018 1447   GLUCOSEU >=500 (A) 02/04/2018 1447   GLUCOSEU >=1000 (A) 03/04/2016 1029   HGBUR NEGATIVE 02/04/2018 1447   BILIRUBINUR NEGATIVE 02/04/2018 1447   BILIRUBINUR negative 03/08/2017 0916   KETONESUR 5 (A) 02/04/2018 1447   PROTEINUR NEGATIVE 02/04/2018 1447   UROBILINOGEN 2.0 (A) 03/08/2017 0916   UROBILINOGEN 0.2 03/04/2016 1029   NITRITE  NEGATIVE 02/04/2018 1447   LEUKOCYTESUR NEGATIVE 02/04/2018 1447     Benito Mccreedy M.D. [facility] 02/05/2018, 12:38 PM  Pager: [phone] Between 7am to 7pm - call Pager - [phone]  After 7pm go to [email or web address] - password [email or web address]  Call night coverage person covering after 7pm

## 2018-02-06 ENCOUNTER — Inpatient Hospital Stay (HOSPITAL_COMMUNITY): Payer: Medicare Other

## 2018-02-06 DIAGNOSIS — A419 Sepsis, unspecified organism: Principal | ICD-10-CM

## 2018-02-06 DIAGNOSIS — E118 Type 2 diabetes mellitus with unspecified complications: Secondary | ICD-10-CM

## 2018-02-06 DIAGNOSIS — J209 Acute bronchitis, unspecified: Secondary | ICD-10-CM

## 2018-02-06 DIAGNOSIS — I10 Essential (primary) hypertension: Secondary | ICD-10-CM

## 2018-02-06 DIAGNOSIS — Z794 Long term (current) use of insulin: Secondary | ICD-10-CM

## 2018-02-06 DIAGNOSIS — R6520 Severe sepsis without septic shock: Secondary | ICD-10-CM

## 2018-02-06 LAB — GLUCOSE, CAPILLARY
GLUCOSE-CAPILLARY: 213 mg/dL — AB (ref 70–99)
Glucose-Capillary: 221 mg/dL — ABNORMAL HIGH (ref 70–99)
Glucose-Capillary: 272 mg/dL — ABNORMAL HIGH (ref 70–99)
Glucose-Capillary: 307 mg/dL — ABNORMAL HIGH (ref 70–99)

## 2018-02-06 MED ORDER — LANTUS SOLOSTAR 100 UNIT/ML ~~LOC~~ SOPN: 20.0000 [IU] | PEN_INJECTOR | Freq: Every day | SUBCUTANEOUS | Status: DC

## 2018-02-06 MED ORDER — GUAIFENESIN ER 600 MG PO TB12: 600.0000 mg | ORAL_TABLET | Freq: Two times a day (BID) | ORAL | 0 refills | Status: AC

## 2018-02-06 MED ORDER — ALBUTEROL SULFATE HFA 108 (90 BASE) MCG/ACT IN AERS: 2.0000 | INHALATION_SPRAY | Freq: Four times a day (QID) | RESPIRATORY_TRACT | 0 refills | Status: DC | PRN

## 2018-02-06 MED ORDER — GUAIFENESIN-DM 100-10 MG/5ML PO SYRP: 5.0000 mL | ORAL_SOLUTION | ORAL | 0 refills | Status: DC | PRN

## 2018-02-06 MED ORDER — ROPINIROLE HCL 0.25 MG PO TABS: 0.2500 mg | ORAL_TABLET | ORAL | Status: DC

## 2018-02-06 MED ORDER — BISACODYL 10 MG RE SUPP
10.0000 mg | Freq: Once | RECTAL | Status: AC
  Administered 2018-02-06: 10 mg via RECTAL
  Filled 2018-02-06: qty 1

## 2018-02-06 MED ORDER — DIAZEPAM 5 MG PO TABS: 2.5000 mg | ORAL_TABLET | Freq: Every evening | ORAL | Status: DC | PRN

## 2018-02-06 MED ORDER — ATORVASTATIN CALCIUM 10 MG PO TABS: 10.0000 mg | ORAL_TABLET | Freq: Every day | ORAL | Status: DC

## 2018-02-06 MED ORDER — AZITHROMYCIN 250 MG PO TABS: 250.0000 mg | ORAL_TABLET | Freq: Every day | ORAL | 0 refills | Status: AC

## 2018-02-06 MED ORDER — PREDNISONE 10 MG PO TABS: ORAL_TABLET | ORAL | 0 refills | Status: DC

## 2018-02-06 NOTE — Discharge Instructions (Signed)

## 2018-02-06 NOTE — Progress Notes (Signed)
Patient remains stable. Pt is A&O x4, out of bed without assistance. Discharge instructions reviewed, questions concerns denied.

## 2018-02-06 NOTE — Discharge Summary (Signed)
Physician Discharge Summary  Marcus Norton OQH:[id number] DOB: [birth year]  PCP: [name], PA-C  Admit date: 02/04/2018 Discharge date: 02/06/2018  Recommendations for Outpatient Follow-up:  1. [name], PA-C/PCP in 1 week with repeat labs (CBC & BMP).  Home Health: None Equipment/Devices: None  Discharge Condition: Improved and stable CODE STATUS: Full Diet recommendation: Heart healthy & diabetic diet.  Discharge Diagnoses:  Active Problems:   Insulin dependent diabetes mellitus with complications (HCC)   Essential hypertension   PAD (peripheral artery disease) (HCC)   Generalized anxiety disorder   Status post coronary artery stent placement   GERD (gastroesophageal reflux disease)   Severe sepsis (HCC)   Acute bronchitis   Hyponatremia   Brief Summary: 77 year old male with PMH of CAD status post cardiac cath and stenting in 2018, PAD, recently diagnosed OSA (saw pulmonology), HTN, DM 2/IDDM and on oral hypoglycemics, HLD, GERD, former tobacco abuse, treated with a course of doxycycline 2 weeks PTA by his pulmonologist for longstanding cough, presented to a clinic in [place] with 2 to 3 days history of persistent productive cough, dyspnea, subjective chills and fevers and abdominal bloating.  He reported that he was at a convention at the [facility] earlier in the week where there were about 500-600 people and he felt that he may have contracted some illness there.  He had associated wheezing and mild headache but no blurred vision, felt weak but denied dizziness, chest pain, palpitations, nausea, vomiting or diarrhea.  At the clinic he was placed on oxygen and EMS was called.  He was found to have low-grade fever.  Course in the ED Patient was found to be septic with fever of 101.1 F, tachycardic in low 100s, O2 sat improved to 92% on 2 L via nasal cannula.  WBC of 11 point 8K, sodium of 130, chloride of 93, BUN of 28, glucose of 196 and lactic acid  elevated to 3.68.  Chest x-ray negative for any infiltrate.  Given concern for acute bronchitis patient given empiric IV Solu-Medrol, Levaquin and IV fluids.  EKG showed normal sinus rhythm.  Flu PCR sent from the ED and hospitalist consulted for admission to telemetry for sepsis.  Assessment and plan:  1. Severe sepsis secondary to acute bronchitis: He was admitted to telemetry.  Flu panel PCR was checked and negative.  Blood cultures x2: Negative to date.  He received levofloxacin in the ED but was then empirically continued on IV ceftriaxone and azithromycin for acute bronchitis.  Lactate was elevated.  Procalcitonin was checked and negative.  He did not have underlying diagnosis of COPD although has a history of remote smoking and chronic cough for several weeks.  He was also placed on IV Solu-Medrol 40 mg every 12 hourly, scheduled albuterol nebulizations and antitussives.  Supportive oxygen was provided.  He clinically improved.  No clinical bronchospasm.  He was discharged to complete course of Z-Pak, steroid taper and albuterol inhaler as needed. 2. Acute respiratory failure with hypoxia: Suspected due to acute bronchitis complicating underlying OSA and unsure if he has underlying COPD.  Treated bronchitis as above.  Supportive oxygen was provided.  Clinically improved.  Reassessed on day of discharge and he was saturating between 89-91% on room air with activity and did not meet home oxygen requirements.. 3. Type II DM/IDDM: Patient was on Lantus, metformin and Januvia PTA.  Last known A1c was 8.3 on 11/17/2017 suggesting poor outpatient control.  He was placed on home dose of Lantus along with sliding  scale insulin.  Metformin and Januvia were held in the hospital.  His was hyperglycemic precipitated by IV steroids.  As his steroids are tapered as outpatient, glycemic control should improve.  He was continued on all prior home medications at discharge.  However he was advised to follow-up with his PCP  for titration of his outpatient diabetic medications for better control. 4. GERD: Continue PPI. 5. OSA: Followed by pulmonology as outpatient and reported having sleep study done.  He informed that his pulmonologist wanted to monitor with Requip for possible RLS and if not improved, plan was on starting CPAP. 6. Hyponatremia: Suspect prerenal from dehydration.  Hydrated with IV fluids.  Resolved. 7. Essential hypertension: He was continued on beta-blockers in the hospital.  Amlodipine and ACE inhibitor's were temporarily held.  Mildly uncontrolled.  Continue all prior home antihypertensives at discharge. 8. CAD: Status post stenting.  He also has PAD.  Continue with aspirin, statins and beta-blockers. 9. Constipation: Reports abdominal bloating and no BM for 2 to 3 days but no abdominal pain, tolerating diet without nausea and vomiting.  No clinical obstruction.  Trial of Dulcolax suppository prior to discharge and recommended OTC laxatives/stool softeners as needed at home.   Consultations:  None  Procedures:  None   Discharge Instructions  Discharge Instructions    Call MD for:  difficulty breathing, headache or visual disturbances   Complete by:  As directed    Call MD for:  extreme fatigue   Complete by:  As directed    Call MD for:  persistant dizziness or light-headedness   Complete by:  As directed    Call MD for:  persistant nausea and vomiting   Complete by:  As directed    Call MD for:  severe uncontrolled pain   Complete by:  As directed    Call MD for:  temperature >100.4   Complete by:  As directed    Diet - low sodium heart healthy   Complete by:  As directed    Diet Carb Modified   Complete by:  As directed    Increase activity slowly   Complete by:  As directed        Medication List    STOP taking these medications   doxycycline 100 MG tablet Commonly known as:  VIBRA-TABS   ipratropium 0.03 % nasal spray Commonly known as:  ATROVENT     TAKE these  medications   acetaminophen 325 MG tablet Commonly known as:  TYLENOL Take 325 mg by mouth 2 (two) times daily as needed for mild pain.   albuterol 108 (90 Base) MCG/ACT inhaler Commonly known as:  PROVENTIL HFA;VENTOLIN HFA Inhale 2 puffs into the lungs every 6 (six) hours as needed for wheezing or shortness of breath.   alfuzosin 10 MG 24 hr tablet Commonly known as:  UROXATRAL Take 10 mg by mouth daily with breakfast.   amLODipine 5 MG tablet Commonly known as:  NORVASC Take 1 tablet (5 mg total) by mouth daily.   aspirin EC 81 MG tablet Take 81 mg by mouth at bedtime.   atorvastatin 10 MG tablet Commonly known as:  LIPITOR Take 1 tablet (10 mg total) by mouth at bedtime.   azelastine 0.1 % nasal spray Commonly known as:  ASTELIN Place 2 sprays into both nostrils 2 (two) times daily. Use in each nostril as directed   azithromycin 250 MG tablet Commonly known as:  ZITHROMAX Take 1 tablet (250 mg total) by mouth daily for  4 days.   diazepam 5 MG tablet Commonly known as:  VALIUM Take 0.5-1 tablets (2.5-5 mg total) by mouth at bedtime as needed (sleep). for sleep What changed:    how much to take  reasons to take this   docusate sodium 100 MG capsule Commonly known as:  COLACE Take 100 mg by mouth as needed for mild constipation.   glucose blood test strip Accu-Chek Aviva Plus test strips   ACCU-CHEK AVIVA PLUS test strip Generic drug:  glucose blood USE TO TEST BLOOD SUAGR TWICE DAILY   guaiFENesin 600 MG 12 hr tablet Commonly known as:  MUCINEX Take 1 tablet (600 mg total) by mouth 2 (two) times daily for 7 days.   guaiFENesin-dextromethorphan 100-10 MG/5ML syrup Commonly known as:  ROBITUSSIN DM Take 5 mLs by mouth every 4 (four) hours as needed for cough.   Insulin Pen Needle 31G X 8 MM Misc Commonly known as:  B-D ULTRAFINE III SHORT PEN Inject insulin SQ daily Dx:E11.8   JANUVIA 100 MG tablet Generic drug:  sitaGLIPtin TAKE 1 TABLET(100 MG) BY  MOUTH DAILY What changed:  See the new instructions.   LANTUS SOLOSTAR 100 UNIT/ML Solostar Pen Generic drug:  Insulin Glargine Inject 20 Units into the skin daily at 10 pm. Increase by 2 units every 3 days   lisinopril 20 MG tablet Commonly known as:  PRINIVIL,ZESTRIL TAKE 1 TABLET BY MOUTH DAILY   Magnesium 200 MG Tabs Take 1 tablet (200 mg total) by mouth daily.   metFORMIN 1000 MG tablet Commonly known as:  GLUCOPHAGE TAKE 1 TABLET(1000 MG) BY MOUTH TWICE DAILY WITH A MEAL What changed:    See the new instructions.  Another medication with the same name was removed. Continue taking this medication, and follow the directions you see here.   metoprolol tartrate 25 MG tablet Commonly known as:  LOPRESSOR TAKE 1 TABLET(25 MG) BY MOUTH TWICE DAILY What changed:  See the new instructions.   pantoprazole 40 MG tablet Commonly known as:  PROTONIX Take 1 tablet (40 mg total) by mouth daily.   predniSONE 10 MG tablet Commonly known as:  DELTASONE Take 3 tabs daily for 2 days, then 2 tabs daily for 2 days, then 1 tab daily for 2 days, then stop. Start taking on:  February 07, 2018   rOPINIRole 0.25 MG tablet Commonly known as:  REQUIP Take 1-4 tablets (0.25-1 mg total) by mouth See admin instructions. Take 0.25 mg by mouth every night for 2 days, 0.5 mg by mouth for 5 nights then 1 mg every night What changed:    See the new instructions.  Another medication with the same name was removed. Continue taking this medication, and follow the directions you see here.   tadalafil 20 MG tablet Commonly known as:  ADCIRCA/CIALIS Take 0.5-1 tablets (10-20 mg total) by mouth every other day as needed for erectile dysfunction.      Follow-up Information    [name], PA-C. Schedule an appointment as soon as possible for a visit in 1 week(s).   Specialty:  Family Medicine Why:  To be seen with repeat labs (CBC & BMP). Contact information: [address] [place]  [place] [address] [phone]          Allergies  Allergen Reactions  . Plavix [Clopidogrel Bisulfate] Shortness Of Breath  . Oxycontin [Oxycodone Hcl] Other (See Comments)    Malaise  . Penicillins Other (See Comments)    [place] Has patient  had a PCN reaction causing immediate rash, facial/tongue/throat swelling, SOB or lightheadedness with hypotension: No Has patient had a PCN reaction causing severe rash involving mucus membranes or skin necrosis: No Has patient had a PCN reaction that required hospitalization No Has patient had a PCN reaction occurring within the last 10 years: No If all of the above answers are "NO", then may proceed with Cephalosporin use.   . Pioglitazone Other (See Comments)    Made his chest hurt  . Prednisone Other (See Comments)    Reaction: muscle aches and soreness  . Simvastatin Other (See Comments)    Myalgias      Procedures/Studies: Dg Chest 2 View  Result Date: 02/06/2018 CLINICAL DATA:  Cough and congestion. EXAM: CHEST - 2 VIEW COMPARISON:  February 04, 2017 FINDINGS: Suggested tiny effusions. Minimal bibasilar atelectasis. The heart, hila, mediastinum, lungs, and pleura are otherwise unremarkable. IMPRESSION: Tiny effusions and atelectasis.  No other acute abnormalities. Electronically Signed   By: Dorise Bullion III M.D   On: 02/06/2018 10:18   Dg Chest Port 1 View  Result Date: 02/04/2018 CLINICAL DATA:  Productive cough, shortness of breath. EXAM: PORTABLE CHEST 1 VIEW COMPARISON:  Radiographs of January 12, 2018. FINDINGS: The heart size and mediastinal contours are within normal limits. Both lungs are clear. No pneumothorax or pleural effusion is noted. Atherosclerosis of thoracic aorta is noted. The visualized skeletal structures are unremarkable. IMPRESSION: No acute cardiopulmonary abnormality seen. Aortic Atherosclerosis (ICD10-I70.0). Electronically Signed   By: Marijo Conception, M.D.   On: 02/04/2018 12:40       Subjective: Overall feels much better.  Reports that his breathing is "80% better".  Minimal intermittent dry cough.  Minimal dyspnea only on exertion.  No chest pain.  No fever or chills.  Abdomen feels bloated but no abdominal pain.  No nausea or vomiting.  Tolerating diet.  Passing flatus.  No BM in 2 to 3 days.  Discharge Exam:  Vitals:   02/05/18 2049 02/06/18 0453 02/06/18 1055 02/06/18 1340  BP:  (!) 164/76 (!) 163/70 (!) 173/70  Pulse:  71 89 79  Resp:  20 16 16   Temp:  (!) 97.2 F (36.2 C)    TempSrc:  Oral    SpO2: 94% 95% 92% 94%  Weight:      Height:        General: Pleasant elderly male, moderately built and overweight ambulating comfortably in the room. Cardiovascular: S1 & S2 heard, RRR, S1/S2 +. No murmurs, rubs, gallops or clicks. No JVD or pedal edema.  Telemetry personally reviewed: Sinus rhythm. Respiratory: Clear to auscultation without wheezing, rhonchi or crackles. No increased work of breathing.  Able to speak in full sentences. Abdominal:  Non distended but obese, non tender & soft. No organomegaly or masses appreciated. Normal bowel sounds heard. CNS: Alert and oriented. No focal deficits. Extremities: no edema, no cyanosis    The results of significant diagnostics from this hospitalization (including imaging, microbiology, ancillary and laboratory) are listed below for reference.     Microbiology: Recent Results (from the past 240 hour(s))  Blood culture (routine x 2)     Status: None (Preliminary result)   Collection Time: 02/04/18 12:40 PM  Result Value Ref Range Status   Specimen Description   Final    BLOOD LEFT FOREARM Performed at [facility], [place] [address] [place], [place] [address]    Special Requests   Final    BOTTLES DRAWN AEROBIC AND ANAEROBIC Blood  Culture adequate volume Performed at [facility] [address], [place], [place] [address]    Culture   Final    NO GROWTH 2  DAYS Performed at [facility] [address] [place], [place] [address]    Report Status PENDING  Incomplete  Blood culture (routine x 2)     Status: None (Preliminary result)   Collection Time: 02/04/18 12:40 PM  Result Value Ref Range Status   Specimen Description   Final    BLOOD RIGHT FOREARM Performed at [facility] [address], [place], [place] [address]    Special Requests   Final    BOTTLES DRAWN AEROBIC AND ANAEROBIC Blood Culture adequate volume Performed at [facility] [address] [place], [place] [address]    Culture   Final    NO GROWTH 2 DAYS Performed at [facility] [address], [place], [place] [address]    Report Status PENDING  Incomplete     Labs: CBC: Recent Labs  Lab 02/04/18 1240 02/04/18 1643 02/05/18 0610  WBC 11.8* 10.1 7.1  NEUTROABS 9.1*  --   --   HGB 13.5 12.5* 12.1*  HCT 41.6 38.3* 37.3*  MCV 102.2* 103.2* 101.6*  PLT 250 221 937   Basic Metabolic Panel: Recent Labs  Lab 02/04/18 1240 02/04/18 1643 02/05/18 0610  NA 130*  --  138  K 4.5  --  4.3  CL 93*  --  105  CO2 24  --  24  GLUCOSE 196*  --  204*  BUN 28*  --  20  CREATININE 0.74 0.77 0.65  CALCIUM 9.2  --  8.4*   Liver Function Tests: Recent Labs  Lab 02/04/18 1240  AST 19  ALT 20  ALKPHOS 49  BILITOT 1.4*  PROT 7.4  ALBUMIN 4.1   CBG: Recent Labs  Lab 02/05/18 1149 02/05/18 1638 02/05/18 2114 02/06/18 0732 02/06/18 1146  GLUCAP 201* 221* 272* 213* 307*   Urinalysis    Component Value Date/Time   COLORURINE YELLOW 02/04/2018 [place] 02/04/2018 1447   LABSPEC 1.016 02/04/2018 1447   PHURINE 5.0 02/04/2018 1447   GLUCOSEU >=500 (A) 02/04/2018 1447   GLUCOSEU >=1000 (A) 03/04/2016 1029   HGBUR NEGATIVE 02/04/2018 [place] 02/04/2018 1447   BILIRUBINUR negative 03/08/2017 0916   KETONESUR 5 (A) 02/04/2018 1447   PROTEINUR NEGATIVE 02/04/2018 1447    UROBILINOGEN 2.0 (A) 03/08/2017 0916   UROBILINOGEN 0.2 03/04/2016 1029   NITRITE NEGATIVE 02/04/2018 1447   LEUKOCYTESUR NEGATIVE 02/04/2018 1447      Time coordinating discharge: 40 minutes  SIGNED:  Vernell Leep, MD, FACP, [facility] Pager [phone]  If 7PM-7AM, please contact night-coverage [email or web address] Password [email or web address] 02/06/2018, 2:12 PM

## 2018-02-06 NOTE — Progress Notes (Signed)
SATURATION QUALIFICATIONS:   Patient Saturations on Room Air at Rest = 92%  Patient Saturations on [employer] while Ambulating = 89-91%  Patient Saturations on N/A Liters of oxygen while Ambulating = N/A%  Please briefly explain why patient needs home oxygen: Patient tolerated activity well overall. Pt ambulated 90 ft with mild complaints of SOB. Saturation maintained 91% with short decrease 89%.

## 2018-02-07 ENCOUNTER — Telehealth: Payer: Self-pay

## 2018-02-07 NOTE — Telephone Encounter (Signed)
Attempted to reach pt to complete TCM and schedule hospital f/u. Phone busy. Will continue to attempt to reach pt.

## 2018-02-08 NOTE — Telephone Encounter (Signed)
Transition Care Management Follow-up Telephone Call   Date discharged? 02/06/2018   How have you been since you were released from the hospital? " Felt weak on Monday and Tuesday, but he states he is feeling some better today.    Do you understand why you were in the hospital? Yes, " I had Fluid in my Lungs, my oxygen went down to 89, and coughing bad"    Do you understand the discharge instructions? yes   Where were you discharged to? Home    Items Reviewed:  Medications reviewed: yes   Allergies reviewed: yes  Dietary changes reviewed: no  Referrals reviewed: no   Functional Questionnaire:   Activities of Daily Living (ADLs):   He states they are independent in the following: ambulation, bathing and hygiene, feeding, continence, grooming, toileting and dressing States they require assistance with the following: no    Any transportation issues/concerns?: no   Any patient concerns? " My biggest Concern is my breathing, he states when he lays down, he does not feel that his breathing is good"    Confirmed importance and date/time of follow-up visits scheduled yes  Provider Appointment booked with 02/17/2018  Confirmed with patient if condition begins to worsen call PCP or go to the ER.  Patient was given the office number and encouraged to call back with question or concerns.  : yes

## 2018-02-08 NOTE — Telephone Encounter (Signed)
LM for Patient to [facility], to complete TCM

## 2018-02-08 NOTE — Telephone Encounter (Signed)
Thank you :)

## 2018-02-08 NOTE — Telephone Encounter (Signed)
Appt rescheduled for 02/13/2018 at 11:00am  Doloris Hall,  LPN

## 2018-02-08 NOTE — Telephone Encounter (Signed)
I would recommend moving up his follow-up to Monday giving recommendations by hospitalist and giving his symptoms, unless he is declining an earlier appointment.

## 2018-02-09 LAB — CULTURE, BLOOD (ROUTINE X 2)
Culture: NO GROWTH
Culture: NO GROWTH
Special Requests: ADEQUATE
Special Requests: ADEQUATE

## 2018-02-12 NOTE — Progress Notes (Signed)
Patient presents to clinic today for TCM/Hospital Follow-up. Patient presented to ER on 02/04/18 with complaints of cough, wheezing, SOB, dizziness despite treatment for bronchitis with Doxy by Pulmonology. Patient was seen at [facility] that day and sent straight to the ER. In the ER, Patient was found to be septic with fever of 101.1 F, tachycardic in low 100s, O2 sat improved to 92% on 2 L via nasal cannula.  WBC of 11 point 8K, lactic acid elevated to 3.68.  Chest x-ray negative for any infiltrate.  Given concern for acute bronchitis patient given empiric IV Solu-Medrol, Levaquin and IV fluids.  EKG showed normal sinus rhythm. Patient admitted to telemetry for further assessment and management of sepsis.  While in hospital, Flu panel by PCR was negative. Blood cultures also negative. He was continued empirically on IV ceftriaxone and Azithromycin for acute bronchitis. Procalcitonin checked and negative.  Lactate elevated but returned to normal. IV Solumedrol given every 12 hours with scheduled SABA nebulizer treatments. He initially required O2 but was quickly weaned once he stabilized. He was discharged on 02/06/18 to complete entire course of Z-pack and steroid taper.  Since discharge, patient endorses doing well overall. Denies any fever, chills, chest pain or SOB. Still noting cough but is dry. Has noted some reflux symptoms and fatigue. Noting bloating and belching initially that has improved. Is following a low-salt diet and keeping well hydrated. Of note, patient notes some symptoms of potential PND. Noting mild leg swelling. Last Echo 07/25/2017 with Grade I diastolic dysfunction. Patient would like to discuss alternative to his lisinopril due to concern of ACEI-induced cough.   Past Medical History:  Diagnosis Date  . 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 Arthritis    "lower back" (07/29/2016)  . ASCVD (arteriosclerotic cardiovascular disease)    -luminal irregularities in 1998 and 2005;  normal EF  . Benign prostatic hypertrophy    status post transurethral resection of the prostate  . CAD (coronary artery disease)    a. Cath 07/29/16 99% ostial RCA & 70% pRCA s/p cutting balloon angioplasty and single SYNERGY DES 2.5X28. 100% very Distal 1st RPLB lesion --> the occlusion site moved further distal with guidewire advancement;  40% ostial LAD; 50% Ost Cx to Prox Cx lesion.  . Coronary artery disease   . Dysphagia   . Erectile dysfunction   . GERD (gastroesophageal reflux disease)   . Heart murmur   . History of hiatal hernia   . History of kidney stones   . Hyperlipidemia   . Hypertension   . Seasonal allergies   . Tobacco abuse    20 pack years; discontinued 1995  . Type II diabetes mellitus ([place])     Current Outpatient Medications on File Prior to Visit  Medication Sig Dispense Refill  . ACCU-CHEK AVIVA PLUS test strip USE TO TEST BLOOD SUAGR TWICE DAILY 200 each 2  . acetaminophen (TYLENOL) 325 MG tablet Take 325 mg by mouth 2 (two) times daily as needed for mild pain.    [name] albuterol (PROVENTIL HFA;VENTOLIN HFA) 108 (90 Base) MCG/ACT inhaler Inhale 2 puffs into the lungs every 6 (six) hours as needed for wheezing or shortness of breath. 1 Inhaler 0  . alfuzosin (UROXATRAL) 10 MG 24 hr tablet Take 10 mg by mouth daily with breakfast.    . amLODipine (NORVASC) 5 MG tablet Take 1 tablet (5 mg total) by mouth daily. 90 tablet 1  . aspirin EC 81 MG tablet Take 81 mg by mouth  at bedtime.     [name] atorvastatin (LIPITOR) 10 MG tablet Take 1 tablet (10 mg total) by mouth at bedtime.    [name] azelastine (ASTELIN) 0.1 % nasal spray Place 2 sprays into both nostrils 2 (two) times daily. Use in each nostril as directed 30 mL 12  . docusate sodium (COLACE) 100 MG capsule Take 100 mg by mouth as needed for mild constipation.     [name] glucose blood test strip Accu-Chek Aviva Plus test strips    . guaiFENesin (MUCINEX) 600 MG 12 hr tablet Take 1 tablet (600 mg total) by mouth 2 (two) times daily  for 7 days. 14 tablet 0  . Insulin Pen Needle (B-D ULTRAFINE III SHORT PEN) 31G X 8 MM MISC Inject insulin SQ daily Dx:E11.8 100 each 3  . JANUVIA 100 MG tablet TAKE 1 TABLET(100 MG) BY MOUTH DAILY (Patient taking differently: Take 100 mg by mouth daily. ) 90 tablet 1  . LANTUS SOLOSTAR 100 UNIT/ML Solostar Pen Inject 20 Units into the skin daily at 10 pm. Increase by 2 units every 3 days    . lisinopril (PRINIVIL,ZESTRIL) 20 MG tablet TAKE 1 TABLET BY MOUTH DAILY (Patient taking differently: Take 20 mg by mouth daily. ) 90 tablet 1  . Magnesium 200 MG TABS Take 1 tablet (200 mg total) by mouth daily. 30 each 6  . metFORMIN (GLUCOPHAGE) 1000 MG tablet TAKE 1 TABLET(1000 MG) BY MOUTH TWICE DAILY WITH A MEAL (Patient taking differently: Take 1,000 mg by mouth 2 (two) times daily with a meal. ) 180 tablet 0  . metoprolol tartrate (LOPRESSOR) 25 MG tablet TAKE 1 TABLET(25 MG) BY MOUTH TWICE DAILY (Patient taking differently: Take 25 mg by mouth 2 (two) times daily. ) 180 tablet 1  . pantoprazole (PROTONIX) 40 MG tablet Take 1 tablet (40 mg total) by mouth daily. 30 tablet 3  . rOPINIRole (REQUIP) 0.25 MG tablet Take 1-4 tablets (0.25-1 mg total) by mouth See admin instructions. Take 0.25 mg by mouth every night for 2 days, 0.5 mg by mouth for 5 nights then 1 mg every night    . tadalafil (CIALIS) 20 MG tablet Take 0.5-1 tablets (10-20 mg total) by mouth every other day as needed for erectile dysfunction. 5 tablet 1   No current facility-administered medications on file prior to visit.     Allergies  Allergen Reactions  . Plavix [Clopidogrel Bisulfate] Shortness Of Breath  . Oxycontin [Oxycodone Hcl] Other (See Comments)    Malaise  . Penicillins Other (See Comments)    [place] Has patient had a PCN reaction causing immediate rash, facial/tongue/throat swelling, SOB or lightheadedness with hypotension: No Has patient had a PCN reaction causing severe rash involving mucus membranes or skin  necrosis: No Has patient had a PCN reaction that required hospitalization No Has patient had a PCN reaction occurring within the last 10 years: No If all of the above answers are "NO", then may proceed with Cephalosporin use.   . Pioglitazone Other (See Comments)    Made his chest hurt  . Prednisone Other (See Comments)    Reaction: muscle aches and soreness  . Simvastatin Other (See Comments)    Myalgias    Family History  Problem Relation Age of Onset  . Ovarian cancer Mother   . Colon cancer Sister 49       deceased from colon cancer    Social History   Socioeconomic History  . Marital status: Married    Spouse name:  Not on file  . Number of children: 2  . Years of education: Not on file  . Highest education level: Not on file  Occupational History  . Occupation: Dietitian, 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    Comment: Gaffer of agriculture  Social Needs  . Financial resource strain: Not on file  . Food insecurity:    Worry: Not on file    Inability: Not on file  . Transportation needs:    Medical: Not on file    Non-medical: Not on file  Tobacco Use  . Smoking status: Former Smoker    Packs/day: 1.00    Years: 15.00    Pack years: 15.00    Types: Cigarettes    Start date: 03/08/1965    Last attempt to quit: 07/07/1993    Years since quitting: 24.6  . Smokeless tobacco: Never Used  Substance and Sexual Activity  . Alcohol use: No    Alcohol/week: 0.0 standard drinks    Frequency: Never    Comment: 07/29/2016 "last drink was 4 wks ago; had a beer"  . Drug use: No  . Sexual activity: Not Currently  Lifestyle  . Physical activity:    Days per week: Not on file    Minutes per session: Not on file  . Stress: Not on file  Relationships  . Social connections:    Talks on phone: Not on file    Gets together: Not on file    Attends religious service: Not on file    Active member of club or organization: Not on file    Attends meetings of clubs or  organizations: Not on file    Relationship status: Not on file  Other Topics Concern  . Not on file  Social History Narrative  . Not on file   Review of Systems - See HPI.  All other ROS are negative.  BP (!) 120/58   Pulse 69   Temp (!) 97.5 F (36.4 C) (Oral)   Resp 14   Ht _0  (1.702 m)   Wt 176 lb (79.8 kg)   SpO2 94%   BMI 27.57 kg/m   Physical Exam Vitals signs reviewed.  Constitutional:      Appearance: Normal appearance.  HENT:     Head: Normocephalic and atraumatic.     Right Ear: Tympanic membrane normal.     Left Ear: Tympanic membrane normal.     Nose: Nose normal.     Mouth/Throat:     Mouth: Mucous membranes are moist.  Eyes:     Conjunctiva/sclera: Conjunctivae normal.  Cardiovascular:     Rate and Rhythm: Normal rate and regular rhythm.     Pulses: Normal pulses.     Heart sounds: Normal heart sounds.  Pulmonary:     Effort: Pulmonary effort is normal.     Breath sounds: Normal breath sounds.  Neurological:     General: No focal deficit present.     Mental Status: He is alert and oriented to person, place, and time.     Coordination: Coordination normal.     Gait: Gait normal.  Psychiatric:        Mood and Affect: Mood normal.     Assessment/Plan: 1. Essential hypertension BP stable today. Will repeat CMP. Will switch lisinopril to low-dose losartan to see if cough improved. Follow-up scheduled.  - Comp Met (CMET)  2. Insulin dependent diabetes mellitus with complications (HCC) Repeat CMP. Titration schedule for insulin reviewed with patient. Will reassess at CPE.  3. Chronic cough Will switch ACEI for ARB. Will schedule follow-up 1 week for reassessment.   4. Severe sepsis ([place]) Resolved clinically. Patient has completed medications as directed. Vitals stable. Repeat labs today.  - CBC w/Diff - Comp Met (CMET)  5. PND (paroxysmal nocturnal dyspnea) 6. Orthopnea Euvolemic on exam today. Will repeat Echo to ensure stability.     Leeanne Rio, PA-C

## 2018-02-13 ENCOUNTER — Other Ambulatory Visit: Payer: Self-pay | Admitting: Physician Assistant

## 2018-02-13 ENCOUNTER — Ambulatory Visit: Payer: Medicare Other | Admitting: Physician Assistant

## 2018-02-13 ENCOUNTER — Other Ambulatory Visit: Payer: Self-pay

## 2018-02-13 ENCOUNTER — Encounter: Payer: Self-pay | Admitting: Physician Assistant

## 2018-02-13 VITALS — BP 120/58 | HR 69 | Temp 97.5°F | Resp 14 | Ht 67.0 in | Wt 176.0 lb

## 2018-02-13 DIAGNOSIS — R6520 Severe sepsis without septic shock: Secondary | ICD-10-CM

## 2018-02-13 DIAGNOSIS — I10 Essential (primary) hypertension: Secondary | ICD-10-CM

## 2018-02-13 DIAGNOSIS — R053 Chronic cough: Secondary | ICD-10-CM

## 2018-02-13 DIAGNOSIS — E118 Type 2 diabetes mellitus with unspecified complications: Secondary | ICD-10-CM

## 2018-02-13 DIAGNOSIS — Z794 Long term (current) use of insulin: Secondary | ICD-10-CM

## 2018-02-13 DIAGNOSIS — R0601 Orthopnea: Secondary | ICD-10-CM

## 2018-02-13 DIAGNOSIS — A419 Sepsis, unspecified organism: Secondary | ICD-10-CM

## 2018-02-13 DIAGNOSIS — R0600 Dyspnea, unspecified: Secondary | ICD-10-CM

## 2018-02-13 DIAGNOSIS — IMO0001 Reserved for inherently not codable concepts without codable children: Secondary | ICD-10-CM

## 2018-02-13 DIAGNOSIS — R05 Cough: Secondary | ICD-10-CM

## 2018-02-13 LAB — COMPREHENSIVE METABOLIC PANEL
ALT: 43 U/L (ref 0–53)
AST: 29 U/L (ref 0–37)
Albumin: 3.8 g/dL (ref 3.5–5.2)
Alkaline Phosphatase: 49 U/L (ref 39–117)
BUN: 22 mg/dL (ref 6–23)
CO2: 29 mEq/L (ref 19–32)
Calcium: 9.7 mg/dL (ref 8.4–10.5)
Chloride: 96 mEq/L (ref 96–112)
Creatinine, Ser: 1.14 mg/dL (ref 0.40–1.50)
GFR: 65.87 mL/min (ref >60.00)
Glucose, Bld: 162 mg/dL — ABNORMAL HIGH (ref 70–99)
POTASSIUM: 5.2 meq/L — AB (ref 3.5–5.1)
Sodium: 132 mEq/L — ABNORMAL LOW (ref 135–145)
Total Bilirubin: 0.5 mg/dL (ref 0.2–1.2)
Total Protein: 6.6 g/dL (ref 6.0–8.3)

## 2018-02-13 LAB — CBC WITH DIFFERENTIAL/PLATELET
Basophils Absolute: 0.1 10*3/uL (ref 0.0–0.1)
Basophils Relative: 0.5 % (ref 0.0–3.0)
Eosinophils Absolute: 0.3 10*3/uL (ref 0.0–0.7)
Eosinophils Relative: 2.1 % (ref 0.0–5.0)
HCT: 40.8 % (ref 39.0–52.0)
Hemoglobin: 13.7 g/dL (ref 13.0–17.0)
Lymphocytes Relative: 11 % — ABNORMAL LOW (ref 12.0–46.0)
Lymphs Abs: 1.7 10*3/uL (ref 0.7–4.0)
MCHC: 33.7 g/dL (ref 30.0–36.0)
MCV: 99.6 fl (ref 78.0–100.0)
MONO ABS: 0.6 10*3/uL (ref 0.1–1.0)
Monocytes Relative: 3.8 % (ref 3.0–12.0)
Neutro Abs: 12.7 10*3/uL — ABNORMAL HIGH (ref 1.4–7.7)
Neutrophils Relative %: 82.6 % — ABNORMAL HIGH (ref 43.0–77.0)
Platelets: 270 10*3/uL (ref 150.0–400.0)
RBC: 4.09 Mil/uL — ABNORMAL LOW (ref 4.22–5.81)
RDW: 13.2 % (ref 11.5–15.5)
WBC: 15.3 10*3/uL — ABNORMAL HIGH (ref 4.0–10.5)

## 2018-02-13 MED ORDER — LOSARTAN POTASSIUM 25 MG PO TABS: 25.0000 mg | ORAL_TABLET | Freq: Every day | ORAL | 1 refills | Status: DC

## 2018-02-13 NOTE — Patient Instructions (Signed)
Please go to the lab today for blood work.  I will call you with your results. We will alter treatment regimen(s) if indicated by your results.   You will be contacted for an echocardiogram to further assess causes of mild swelling.   Stop the Lisinopril. Start the Losartan as directed daily in its place. Let's see if cough and bloating improve with this change. Start a daily probiotic (Align, Digestive Advantage or FD Guard) to help with bloating as well. Keep hydrated and keep a low-salt diet.  We will schedule follow-up based on lab results.   For your Lantus, as discussed last time you are to increase by 2 units every 3-4 days if fasting blood sugars are > 150. Goal for now is 130-150 consistently and then we will work on tighter control of sugars.   Notify me immediately of any increased swelling.

## 2018-02-17 ENCOUNTER — Ambulatory Visit (HOSPITAL_COMMUNITY): Payer: Medicare Other | Attending: Cardiovascular Disease

## 2018-02-17 ENCOUNTER — Other Ambulatory Visit: Payer: Self-pay | Admitting: Physician Assistant

## 2018-02-17 ENCOUNTER — Inpatient Hospital Stay: Payer: Medicare Other | Admitting: Physician Assistant

## 2018-02-17 ENCOUNTER — Other Ambulatory Visit: Payer: Self-pay

## 2018-02-17 DIAGNOSIS — I10 Essential (primary) hypertension: Secondary | ICD-10-CM

## 2018-02-17 DIAGNOSIS — R0600 Dyspnea, unspecified: Secondary | ICD-10-CM

## 2018-02-17 DIAGNOSIS — R0601 Orthopnea: Secondary | ICD-10-CM

## 2018-02-21 ENCOUNTER — Telehealth: Payer: Self-pay | Admitting: Physician Assistant

## 2018-02-21 NOTE — Telephone Encounter (Signed)
Patient calling to get lab results. Nurse triage currently unavailable. Please advise.   Copied from [place] [id number]. Topic: Quick Communication - Lab Results (Clinic Use ONLY) >> Feb 21, 2018 10:10 AM Katina Dung, CMA wrote: Called patient to inform them of  lab results. When patient returns call, triage nurse may disclose results.

## 2018-02-21 NOTE — Telephone Encounter (Signed)
Pt's wife given results and documented in result note

## 2018-02-28 ENCOUNTER — Encounter: Payer: Self-pay | Admitting: Cardiovascular Disease

## 2018-02-28 ENCOUNTER — Ambulatory Visit: Payer: Medicare Other | Admitting: Cardiovascular Disease

## 2018-02-28 VITALS — BP 138/58 | HR 72 | Ht 67.0 in | Wt 177.2 lb

## 2018-02-28 DIAGNOSIS — E785 Hyperlipidemia, unspecified: Secondary | ICD-10-CM

## 2018-02-28 DIAGNOSIS — I25118 Atherosclerotic heart disease of native coronary artery with other forms of angina pectoris: Secondary | ICD-10-CM

## 2018-02-28 DIAGNOSIS — I10 Essential (primary) hypertension: Secondary | ICD-10-CM

## 2018-02-28 DIAGNOSIS — I739 Peripheral vascular disease, unspecified: Secondary | ICD-10-CM

## 2018-02-28 DIAGNOSIS — I350 Nonrheumatic aortic (valve) stenosis: Secondary | ICD-10-CM

## 2018-02-28 NOTE — Patient Instructions (Signed)
Medication Instructions:  No changes If you need a refill on your cardiac medications before your next appointment, please call your pharmacy.   Lab work: None odered  Testing/Procedures: None ordered  Follow-Up: At [facility], you and your health needs are our priority.  As part of our continuing mission to provide you with exceptional heart care, we have created designated Provider Care Teams.  These Care Teams include your primary Cardiologist (physician) and Advanced Practice Providers (APPs -  Physician Assistants and Nurse Practitioners) who all work together to provide you with the care you need, when you need it. You will need a follow up appointment in 6 months.  Please call our office 2 months in advance to schedule this appointment.  You may see [name], MD or one of the following Advanced Practice Providers on your designated Care Team:   Kerin Ransom, PA-C Roby Lofts, [place] . Sande Rives, PA-C

## 2018-02-28 NOTE — Progress Notes (Signed)
Cardiology Office Note   Date:  02/28/2018   ID:  Berry, Godsey 1940-05-03, MRN [id number]  PCP:  [name], PA-C  Cardiologist: Dr. Fletcher Anon  Chief Complaint  Patient presents with  . Follow-up    pt denied chest pain      History of Present Illness: Marcus Norton is a 78 y.o. male who presents for a follow-up visit regarding claudication and PAD. He has known history of  paroxysmal supraventricular tachycardia,  coronary artery disease, mild to moderate aortic stenosis, type 2 diabetes since 1990, hypertension and hyperlipidemia. He has known history of claudication . He did not tolerate cilostazol due to palpitations. He is s/p bilateral common iliac artery kissing stent placement in 11/2015. 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   He had abnormal nuclear stress test in 2018.  Cardiac catheterization in June, 2018 showed severe ostial RCA stenosis and occluded RPL2 with nonobstructive disease affecting the left system.  He underwent successful angioplasty and drug-eluting stent placement to the ostial right coronary artery.  He was hospitalized in December with sepsis due to acute bronchitis.  He was treated with antibiotics and steroids with subsequent improvement.  After discharge, he had significant leg edema that gradually improved.  He had an echocardiogram done which showed normal LV systolic function with grade 1 diastolic dysfunction, mild aortic stenosis with mean gradient of 15 mmHg and no other valvular abnormalities.  He feels close to baseline.  He has not been exerting himself enough to experience claudication.  He did have some episodes of sharp left-sided chest pain which likely was related to his prolonged coughing.  Past Medical History:  Diagnosis Date  . 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 Arthritis    "lower  back" (07/29/2016)  . ASCVD (arteriosclerotic cardiovascular disease)    -luminal irregularities in 1998 and 2005; normal EF  . Benign prostatic hypertrophy    status post transurethral resection of the prostate  . CAD (coronary artery disease)    a. Cath 07/29/16 99% ostial RCA & 70% pRCA s/p cutting balloon angioplasty and single SYNERGY DES 2.5X28. 100% very Distal 1st RPLB lesion --> the occlusion site moved further distal with guidewire advancement;  40% ostial LAD; 50% Ost Cx to Prox Cx lesion.  . Coronary artery disease   . Dysphagia   . Erectile dysfunction   . GERD (gastroesophageal reflux disease)   . Heart murmur   . History of hiatal hernia   . History of kidney stones   . Hyperlipidemia   . Hypertension   . Seasonal allergies   . Tobacco abuse    20 pack years; discontinued 1995  . Type II diabetes mellitus ([place])     Past Surgical History:  Procedure Laterality Date  . APPENDECTOMY    . CATARACT EXTRACTION W/ INTRAOCULAR LENS  IMPLANT, BILATERAL Bilateral   . COLONOSCOPY  07/20/2011   Dr. Gala Romney: multiple hyperplastic polyps. Surveillance 2018  . COLONOSCOPY N/A 06/30/2016   Procedure: COLONOSCOPY;  Surgeon: Daneil Dolin, MD;  Location[facility];  Service: Endoscopy;  Laterality: N/A;  10:00am  . COLONOSCOPY W/ BIOPSIES AND POLYPECTOMY  07/08/08, 06/2011   Dr Madolyn Frieze papilla, diminutive rectal polyp ablated the, left-sided diverticula, piecemeal polypectomy of hepatic flexure adenomatous polyp, diminutive polyps near hepatic flexure ablated  . CORONARY ANGIOPLASTY WITH STENT PLACEMENT  07/29/2016  . CORONARY STENT INTERVENTION N/A 07/29/2016   Procedure: Coronary Stent Intervention;  Surgeon: Leonie Man, MD;  Location: [place] [facility];  Service: Cardiovascular;  Laterality: N/A;  RCA  . ESOPHAGOGASTRODUODENOSCOPY  2013   erosive reflux esophagitis, s/p empiric dilation. chronic duodenitis.   [name] LEFT HEART CATH AND CORONARY ANGIOGRAPHY N/A 07/29/2016    Procedure: Left Heart Cath and Coronary Angiography;  Surgeon: Leonie Man, MD;  Location: [place] [facility];  Service: Cardiovascular;  Laterality: N/A;  . NASAL SEPTUM SURGERY    . NASAL SINUS SURGERY     "cleaned out my sinus"  . PERIPHERAL VASCULAR CATHETERIZATION N/A 11/26/2015   Procedure: Abdominal Aortogram w/Lower Extremity;  Surgeon: Wellington Hampshire, MD;  Location: [place] [facility];  Service: Cardiovascular;  Laterality: N/A;  . TONSILLECTOMY    . TRANSURETHRAL RESECTION OF PROSTATE  2009     Current Outpatient Medications  Medication Sig Dispense Refill  . ACCU-CHEK AVIVA PLUS test strip USE TO TEST BLOOD SUAGR TWICE DAILY 200 each 2  . acetaminophen (TYLENOL) 325 MG tablet Take 325 mg by mouth 2 (two) times daily as needed for mild pain.    [name] albuterol (PROVENTIL HFA;VENTOLIN HFA) 108 (90 Base) MCG/ACT inhaler Inhale 2 puffs into the lungs every 6 (six) hours as needed for wheezing or shortness of breath. 1 Inhaler 0  . alfuzosin (UROXATRAL) 10 MG 24 hr tablet Take 10 mg by mouth daily with breakfast.    . amLODipine (NORVASC) 5 MG tablet Take 1 tablet (5 mg total) by mouth daily. 90 tablet 1  . aspirin EC 81 MG tablet Take 81 mg by mouth at bedtime.     [name] atorvastatin (LIPITOR) 10 MG tablet Take 1 tablet (10 mg total) by mouth at bedtime.    [name] azelastine (ASTELIN) 0.1 % nasal spray Place 2 sprays into both nostrils 2 (two) times daily. Use in each nostril as directed 30 mL 12  . docusate sodium (COLACE) 100 MG capsule Take 100 mg by mouth as needed for mild constipation.     [name] glucose blood test strip Accu-Chek Aviva Plus test strips    . Insulin Pen Needle (B-D ULTRAFINE III SHORT PEN) 31G X 8 MM MISC Inject insulin SQ daily Dx:E11.8 100 each 3  . JANUVIA 100 MG tablet TAKE 1 TABLET(100 MG) BY MOUTH DAILY (Patient taking differently: Take 100 mg by mouth daily. ) 90 tablet 1  . LANTUS SOLOSTAR 100 UNIT/ML Solostar Pen Inject 20 Units into the skin daily at 10 pm.  Increase by 2 units every 3 days    . losartan (COZAAR) 25 MG tablet TAKE 1 TABLET(25 MG) BY MOUTH DAILY 90 tablet 1  . Magnesium 200 MG TABS Take 1 tablet (200 mg total) by mouth daily. 30 each 6  . metFORMIN (GLUCOPHAGE) 1000 MG tablet TAKE 1 TABLET(1000 MG) BY MOUTH TWICE DAILY WITH A MEAL (Patient taking differently: Take 1,000 mg by mouth 2 (two) times daily with a meal. ) 180 tablet 0  . metoprolol tartrate (LOPRESSOR) 25 MG tablet TAKE 1 TABLET(25 MG) BY MOUTH TWICE DAILY (Patient taking differently: Take 25 mg by mouth 2 (two) times daily. ) 180 tablet 1  . pantoprazole (PROTONIX) 40 MG tablet Take 1 tablet (40 mg total) by mouth daily. 30 tablet 3  . tadalafil (CIALIS) 20 MG tablet Take 0.5-1 tablets (10-20 mg total) by mouth every other day as needed for erectile dysfunction. 5 tablet 1  No current facility-administered medications for this visit.     Allergies:   Plavix [clopidogrel bisulfate]; Oxycontin [oxycodone hcl]; Penicillins; Pioglitazone; Prednisone; and Simvastatin    Social History:  The patient  reports that he quit smoking about 24 years ago. His smoking use included cigarettes. He started smoking about 53 years ago. He has a 15.00 pack-year smoking history. He has never used smokeless tobacco. He reports that he does not drink alcohol or use drugs.   Family History:  The patient's family history includes Colon cancer (age of onset: 31) in his sister; Ovarian cancer in his mother.    ROS:  Please see the history of present illness.   Otherwise, review of systems are positive for none.   All other systems are reviewed and negative.    PHYSICAL EXAM: VS:  BP (!) 138/58   Pulse 72   Ht 5\' 7"  (1.702 m)   Wt 177 lb 3.2 oz (80.4 kg)   SpO2 97%   BMI 27.75 kg/m  , BMI Body mass index is 27.75 kg/m. GEN: Well nourished, well developed, in no acute distress  HEENT: normal  Neck: no JVD, carotid bruits, or masses Cardiac: RRR; no  rubs, or gallops,no edema . 2/6  crescendo decrescendo systolic murmur in the aortic area which is mid peaking Respiratory:  clear to auscultation bilaterally, normal work of breathing GI: soft, nontender, nondistended, + BS MS: no deformity or atrophy  Skin: warm and dry, no rash Neuro:  Strength and sensation are intact Psych: euthymic mood, full affect Vascular: Femoral pulse: Normal bilaterally.    EKG:  EKG is not ordered today.   Recent Labs: 02/13/2018: ALT 43; BUN 22; Creatinine, Ser 1.14; Hemoglobin 13.7; Platelets 270.0; Potassium 5.2; Sodium 132    Lipid Panel    Component Value Date/Time   CHOL 122 11/17/2017 0954   CHOL 129 10/04/2017 1139   TRIG 70.0 11/17/2017 0954   TRIG 96 03/12/2008   HDL 35.00 (L) 11/17/2017 0954   HDL 36 (L) 10/04/2017 1139   CHOLHDL 3 11/17/2017 0954   VLDL 14.0 11/17/2017 0954   LDLCALC 73 11/17/2017 0954   LDLCALC 76 10/04/2017 1139   LDLCALC 91 03/12/2008      Wt Readings from Last 3 Encounters:  02/28/18 177 lb 3.2 oz (80.4 kg)  02/13/18 176 lb (79.8 kg)  02/04/18 180 lb 12.4 oz (82 kg)       No flowsheet data found.    ASSESSMENT AND PLAN:  1.  Peripheral arterial disease:   Minimal claudication at the present time.  I advised him to resume his exercise.  He is due for noninvasive vascular evaluation in June of this year.    2. Coronary artery disease involving native coronary arteries with other forms of angina: Status post RCA stent in June 2018.  No recent anginal symptoms.  Continue aspirin indefinitely and treatment of risk factors.    3. Hyperlipidemia: He has history of intolerance to high-dose statins but is tolerating 10 mg of atorvastatin.  Most recent LDL was 73 which is improved from before.  4. Essential hypertension: Blood pressure is well controlled on current medications.  5.  Mild to moderate aortic stenosis: This was stable on most recent echocardiogram last month.  Repeat study in 1 to 2 years.    Disposition:   FU with me in 6  months  Signed,  [name], MD  02/28/2018 9:18 AM    [place]

## 2018-03-02 ENCOUNTER — Encounter: Payer: Self-pay | Admitting: Physician Assistant

## 2018-03-02 ENCOUNTER — Ambulatory Visit: Payer: Medicare Other | Admitting: Physician Assistant

## 2018-03-02 ENCOUNTER — Other Ambulatory Visit: Payer: Self-pay

## 2018-03-02 VITALS — BP 124/60 | HR 74 | Temp 97.5°F | Resp 14 | Ht 67.0 in | Wt 176.0 lb

## 2018-03-02 DIAGNOSIS — D72829 Elevated white blood cell count, unspecified: Secondary | ICD-10-CM | POA: Diagnosis not present

## 2018-03-02 DIAGNOSIS — I10 Essential (primary) hypertension: Secondary | ICD-10-CM

## 2018-03-02 LAB — CBC WITH DIFFERENTIAL/PLATELET
BASOS ABS: 0.1 10*3/uL (ref 0.0–0.1)
Basophils Relative: 0.9 % (ref 0.0–3.0)
Eosinophils Absolute: 0.3 10*3/uL (ref 0.0–0.7)
Eosinophils Relative: 5.1 % — ABNORMAL HIGH (ref 0.0–5.0)
HCT: 42 % (ref 39.0–52.0)
Hemoglobin: 14.1 g/dL (ref 13.0–17.0)
Lymphocytes Relative: 21.9 % (ref 12.0–46.0)
Lymphs Abs: 1.2 10*3/uL (ref 0.7–4.0)
MCHC: 33.6 g/dL (ref 30.0–36.0)
MCV: 100.4 fl — ABNORMAL HIGH (ref 78.0–100.0)
Monocytes Absolute: 0.4 10*3/uL (ref 0.1–1.0)
Monocytes Relative: 6.4 % (ref 3.0–12.0)
Neutro Abs: 3.6 10*3/uL (ref 1.4–7.7)
Neutrophils Relative %: 65.7 % (ref 43.0–77.0)
Platelets: 282 10*3/uL (ref 150.0–400.0)
RBC: 4.18 Mil/uL — ABNORMAL LOW (ref 4.22–5.81)
RDW: 14.2 % (ref 11.5–15.5)
WBC: 5.5 10*3/uL (ref 4.0–10.5)

## 2018-03-02 LAB — BASIC METABOLIC PANEL
BUN: 23 mg/dL (ref 6–23)
CO2: 30 mEq/L (ref 19–32)
Calcium: 9.8 mg/dL (ref 8.4–10.5)
Chloride: 98 mEq/L (ref 96–112)
Creatinine, Ser: 0.79 mg/dL (ref 0.40–1.50)
GFR: 100.57 mL/min (ref >60.00)
Glucose, Bld: 218 mg/dL — ABNORMAL HIGH (ref 70–99)
Potassium: 4.7 mEq/L (ref 3.5–5.1)
Sodium: 135 mEq/L (ref 135–145)

## 2018-03-02 NOTE — Patient Instructions (Addendum)
Please go to the lab today for blood work.  I will call you with your results. We will alter treatment regimen(s) if indicated by your results.   I am glad cough is resolving. Continue losartan as directed for now.  Check BP daily and record.  Call me in 1 week with readings.  We will make further changes if needed.

## 2018-03-02 NOTE — Progress Notes (Signed)
Patient presents to clinic today for follow-up of hypertension and chronic cough. At last visit patient's lisinopril was discontinued and he was started on losartan 25 mg daily. Endorses taking medication as directed and tolerating well. Notes significant improvement in cough. Still noting mild occasional dry cough but is getting over a small cold as well. Patient denies chest pain, palpitations, lightheadedness, dizziness, vision changes or frequent headaches.  BP Readings from Last 3 Encounters:  03/02/18 124/60  02/28/18 (!) 138/58  02/13/18 (!) 120/58   Patient also due for repeat CBC and BMP due to mild leukocytosis (felt to be steroid-induced) and elevated potassium level. Notes he is feeling great.    Past Medical History:  Diagnosis Date  . 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 Arthritis    "lower back" (07/29/2016)  . ASCVD (arteriosclerotic cardiovascular disease)    -luminal irregularities in 1998 and 2005; normal EF  . Benign prostatic hypertrophy    status post transurethral resection of the prostate  . CAD (coronary artery disease)    a. Cath 07/29/16 99% ostial RCA & 70% pRCA s/p cutting balloon angioplasty and single SYNERGY DES 2.5X28. 100% very Distal 1st RPLB lesion --> the occlusion site moved further distal with guidewire advancement;  40% ostial LAD; 50% Ost Cx to Prox Cx lesion.  . Coronary artery disease   . Dysphagia   . Erectile dysfunction   . GERD (gastroesophageal reflux disease)   . Heart murmur   . History of hiatal hernia   . History of kidney stones   . Hyperlipidemia   . Hypertension   . Seasonal allergies   . Tobacco abuse    20 pack years; discontinued 1995  . Type II diabetes mellitus ([employer])     Current Outpatient Medications on File Prior to Visit  Medication Sig Dispense Refill  . ACCU-CHEK AVIVA PLUS test strip USE TO TEST BLOOD SUAGR TWICE DAILY 200 each 2  . acetaminophen (TYLENOL) 325 MG tablet Take 325 mg by mouth 2 (two) times daily as  needed for mild pain.    [name] albuterol (PROVENTIL HFA;VENTOLIN HFA) 108 (90 Base) MCG/ACT inhaler Inhale 2 puffs into the lungs every 6 (six) hours as needed for wheezing or shortness of breath. 1 Inhaler 0  . alfuzosin (UROXATRAL) 10 MG 24 hr tablet Take 10 mg by mouth daily with breakfast.    . amLODipine (NORVASC) 5 MG tablet Take 1 tablet (5 mg total) by mouth daily. 90 tablet 1  . aspirin EC 81 MG tablet Take 81 mg by mouth at bedtime.     [name] atorvastatin (LIPITOR) 10 MG tablet Take 1 tablet (10 mg total) by mouth at bedtime.    [name] azelastine (ASTELIN) 0.1 % nasal spray Place 2 sprays into both nostrils 2 (two) times daily. Use in each nostril as directed 30 mL 12  . docusate sodium (COLACE) 100 MG capsule Take 100 mg by mouth as needed for mild constipation.     [name] glucose blood test strip Accu-Chek Aviva Plus test strips    . Insulin Pen Needle (B-D ULTRAFINE III SHORT PEN) 31G X 8 MM MISC Inject insulin SQ daily Dx:E11.8 100 each 3  . JANUVIA 100 MG tablet TAKE 1 TABLET(100 MG) BY MOUTH DAILY (Patient taking differently: Take 100 mg by mouth daily. ) 90 tablet 1  . LANTUS SOLOSTAR 100 UNIT/ML Solostar Pen Inject 20 Units into the skin daily at 10 pm. Increase by 2 units every 3 days    .  losartan (COZAAR) 25 MG tablet TAKE 1 TABLET(25 MG) BY MOUTH DAILY 90 tablet 1  . Magnesium 200 MG TABS Take 1 tablet (200 mg total) by mouth daily. 30 each 6  . metFORMIN (GLUCOPHAGE) 1000 MG tablet TAKE 1 TABLET(1000 MG) BY MOUTH TWICE DAILY WITH A MEAL (Patient taking differently: Take 1,000 mg by mouth 2 (two) times daily with a meal. ) 180 tablet 0  . metoprolol tartrate (LOPRESSOR) 25 MG tablet TAKE 1 TABLET(25 MG) BY MOUTH TWICE DAILY (Patient taking differently: Take 25 mg by mouth 2 (two) times daily. ) 180 tablet 1  . pantoprazole (PROTONIX) 40 MG tablet Take 1 tablet (40 mg total) by mouth daily. 30 tablet 3  . tadalafil (CIALIS) 20 MG tablet Take 0.5-1 tablets (10-20 mg total) by mouth every other day  as needed for erectile dysfunction. 5 tablet 1   No current facility-administered medications on file prior to visit.     Allergies  Allergen Reactions  . Plavix [Clopidogrel Bisulfate] Shortness Of Breath  . Oxycontin [Oxycodone Hcl] Other (See Comments)    Malaise  . Penicillins Other (See Comments)    [place] Has patient had a PCN reaction causing immediate rash, facial/tongue/throat swelling, SOB or lightheadedness with hypotension: No Has patient had a PCN reaction causing severe rash involving mucus membranes or skin necrosis: No Has patient had a PCN reaction that required hospitalization No Has patient had a PCN reaction occurring within the last 10 years: No If all of the above answers are "NO", then may proceed with Cephalosporin use.   . Pioglitazone Other (See Comments)    Made his chest hurt  . Prednisone Other (See Comments)    Reaction: muscle aches and soreness  . Simvastatin Other (See Comments)    Myalgias    Family History  Problem Relation Age of Onset  . Ovarian cancer Mother   . Colon cancer Sister 94       deceased from colon cancer    Social History   Socioeconomic History  . Marital status: Married    Spouse name: Not on file  . Number of children: 2  . Years of education: Not on file  . Highest education level: Not on file  Occupational History  . Occupation: Dietitian,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    Comment: Gaffer of agriculture  Social Needs  . Financial resource strain: Not on file  . Food insecurity:    Worry: Not on file    Inability: Not on file  . Transportation needs:    Medical: Not on file    Non-medical: Not on file  Tobacco Use  . Smoking status: Former Smoker    Packs/day: 1.00    Years: 15.00    Pack years: 15.00    Types: Cigarettes    Start date: 03/08/1965    Last attempt to quit: 07/07/1993    Years since quitting: 24.6  . Smokeless tobacco: Never Used  Substance and Sexual Activity  . Alcohol use:  No    Alcohol/week: 0.0 standard drinks    Frequency: Never    Comment: 07/29/2016 "last drink was 4 wks ago; had a beer"  . Drug use: No  . Sexual activity: Not Currently  Lifestyle  . Physical activity:    Days per week: Not on file    Minutes per session: Not on file  . Stress: Not on file  Relationships  . Social connections:    Talks on phone: Not on file  Gets together: Not on file    Attends religious service: Not on file    Active member of club or organization: Not on file    Attends meetings of clubs or organizations: Not on file    Relationship status: Not on file  Other Topics Concern  . Not on file  Social History Narrative  . Not on file   Review of Systems - See HPI.  All other ROS are negative.  BP 124/60   Pulse 74   Temp (!) 97.5 F (36.4 C) (Oral)   Resp 14   Ht _0  (1.702 m)   Wt 176 lb (79.8 kg)   SpO2 96%   BMI 27.57 kg/m   Physical Exam Vitals signs reviewed.  Constitutional:      Appearance: Normal appearance.  HENT:     Head: Normocephalic and atraumatic.  Cardiovascular:     Rate and Rhythm: Normal rate and regular rhythm.     Pulses: Normal pulses.  Pulmonary:     Effort: Pulmonary effort is normal.     Breath sounds: Normal breath sounds.  Neurological:     Mental Status: He is alert.    Recent Results (from the past 2160 hour(s))  Fructosamine     Status: None   Collection Time: 12/20/17  9:45 AM  Result Value Ref Range   Fructosamine 281 205 - 285 umol/L  Basic metabolic panel     Status: Abnormal   Collection Time: 12/20/17  9:45 AM  Result Value Ref Range   Sodium 136 135 - 145 mEq/L   Potassium 5.0 3.5 - 5.1 mEq/L   Chloride 98 96 - 112 mEq/L   CO2 31 19 - 32 mEq/L   Glucose, Bld 161 (H) 70 - 99 mg/dL   BUN 26 (H) 6 - 23 mg/dL   Creatinine, Ser 0.84 0.40 - 1.50 mg/dL   Calcium 9.8 8.4 - 10.5 mg/dL   GFR 93.74 >60.00 mL/min  CBC with Differential/Platelet     Status: Abnormal   Collection Time: 02/04/18 12:40 PM    Result Value Ref Range   WBC 11.8 (H) 4.0 - 10.5 K/uL   RBC 4.07 (L) 4.22 - 5.81 MIL/uL   Hemoglobin 13.5 13.0 - 17.0 g/dL   HCT 41.6 39.0 - 52.0 %   MCV 102.2 (H) 80.0 - 100.0 fL   MCH 33.2 26.0 - 34.0 pg   MCHC 32.5 30.0 - 36.0 g/dL   RDW 12.6 11.5 - 15.5 %   Platelets 250 150 - 400 K/uL   nRBC 0.0 0.0 - 0.2 %   Neutrophils Relative % 78 %   Neutro Abs 9.1 (H) 1.7 - 7.7 K/uL   Lymphocytes Relative 10 %   Lymphs Abs 1.1 0.7 - 4.0 K/uL   Monocytes Relative 12 %   Monocytes Absolute 1.5 (H) 0.1 - 1.0 K/uL   Eosinophils Relative 0 %   Eosinophils Absolute 0.0 0.0 - 0.5 K/uL   Basophils Relative 0 %   Basophils Absolute 0.1 0.0 - 0.1 K/uL   WBC Morphology INCREASED BANDS (>20% BANDS)    Immature Granulocytes 0 %   Abs Immature Granulocytes 0.03 0.00 - 0.07 K/uL    Comment: Performed at [facility], [place] [address], [place], [place] [address]  Comprehensive metabolic panel     Status: Abnormal   Collection Time: 02/04/18 12:40 PM  Result Value Ref Range   Sodium 130 (L) 135 - 145 mmol/L   Potassium 4.5 3.5 -  5.1 mmol/L   Chloride 93 (L) 98 - 111 mmol/L   CO2 24 22 - 32 mmol/L   Glucose, Bld 196 (H) 70 - 99 mg/dL   BUN 28 (H) 8 - 23 mg/dL   Creatinine, Ser 0.74 0.61 - 1.24 mg/dL   Calcium 9.2 8.9 - 10.3 mg/dL   Total Protein 7.4 6.5 - 8.1 g/dL   Albumin 4.1 3.5 - 5.0 g/dL   AST 19 15 - 41 U/L   ALT 20 0 - 44 U/L   Alkaline Phosphatase 49 38 - 126 U/L   Total Bilirubin 1.4 (H) 0.3 - 1.2 mg/dL   GFR calc non Af Amer >60 >60 mL/min   GFR calc Af Amer >60 >60 mL/min   Anion gap 13 5 - 15    Comment: Performed at [facility], [place] [address], [place], [place] [address]  Blood culture (routine x 2)     Status: None   Collection Time: 02/04/18 12:40 PM  Result Value Ref Range   Specimen Description      BLOOD LEFT FOREARM Performed at [facility] [address], [place], [place] [address]    Special Requests       BOTTLES DRAWN AEROBIC AND ANAEROBIC Blood Culture adequate volume Performed at [facility] [address], [place], [place] [address]    Culture      NO GROWTH 5 DAYS Performed at [facility], [place] [address], [place], [place] [address]    Report Status 02/09/2018 FINAL   Blood culture (routine x 2)     Status: None   Collection Time: 02/04/18 12:40 PM  Result Value Ref Range   Specimen Description      BLOOD RIGHT FOREARM Performed at [facility] [address] [place], [place] [address]    Special Requests      BOTTLES DRAWN AEROBIC AND ANAEROBIC Blood Culture adequate volume Performed at [facility] [address], [place], [place] [address]    Culture      NO GROWTH 5 DAYS Performed at [facility], [place] [address], [place], [place] [address]    Report Status 02/09/2018 FINAL   I-stat troponin, ED     Status: None   Collection Time: 02/04/18 12:49 PM  Result Value Ref Range   Troponin i, poc 0.02 0.00 - 0.08 ng/mL   Comment 3            Comment: Due to the release kinetics of cTnI, a negative result within the first hours of the onset of symptoms does not rule out myocardial infarction with certainty. If myocardial infarction is still suspected, repeat the test at appropriate intervals.   I-Stat CG4 Lactic Acid, ED     Status: Abnormal   Collection Time: 02/04/18 12:51 PM  Result Value Ref Range   Lactic Acid, Venous 2.81 (HH) 0.5 - 1.9 mmol/L   Comment NOTIFIED PHYSICIAN   Influenza panel by PCR (type A & B)     Status: None   Collection Time: 02/04/18  1:45 PM  Result Value Ref Range   Influenza A By PCR NEGATIVE NEGATIVE   Influenza B By PCR NEGATIVE NEGATIVE    Comment: (NOTE) The Xpert Xpress Flu assay is intended as an aid in the diagnosis of  influenza and should not be used as a sole basis for treatment.  This  assay is FDA approved for nasopharyngeal swab specimens only. Nasal  washings and aspirates are unacceptable for Xpert Xpress Flu testing. Performed at [facility], [place] [address], [place], [place] [address]   I-Stat CG4 Lactic Acid, ED     Status: Abnormal   Collection Time: 02/04/18  2:28 PM  Result Value Ref Range   Lactic Acid, Venous 3.68 (HH) 0.5 - 1.9 mmol/L   Comment NOTIFIED PHYSICIAN   Urinalysis, Routine w reflex microscopic     Status: Abnormal   Collection Time: 02/04/18  2:47 PM  Result Value Ref Range   Color, Urine YELLOW YELLOW   APPearance CLEAR CLEAR   Specific Gravity, Urine 1.016 1.005 - 1.030   pH 5.0 5.0 - 8.0   Glucose, UA >=500 (A) NEGATIVE mg/dL   Hgb urine dipstick NEGATIVE NEGATIVE   Bilirubin Urine NEGATIVE NEGATIVE   Ketones, ur 5 (A) NEGATIVE mg/dL   Protein, ur NEGATIVE NEGATIVE mg/dL   Nitrite NEGATIVE NEGATIVE   Leukocytes, UA NEGATIVE NEGATIVE   RBC / HPF 0-5 0 - 5 RBC/hpf   WBC, UA 0-5 0 - 5 WBC/hpf   Bacteria, UA NONE SEEN NONE SEEN   Squamous Epithelial / LPF 0-5 0 - 5   Mucus PRESENT    Hyaline Casts, UA PRESENT     Comment: Performed at [facility], [place] [address] [place], [place] [address]  Lactic acid, plasma     Status: Abnormal   Collection Time: 02/04/18  4:43 PM  Result Value Ref Range   Lactic Acid, Venous 3.1 (HH) 0.5 - 1.9 mmol/L    Comment: CRITICAL RESULT CALLED TO, READ BACK BY AND VERIFIED WITH: DUNKELBERGER,K AT 1723 ON 02/04/2018 BY MOSLEY,J Performed at [facility], [place] [address] [place], [place] [address]   Procalcitonin     Status: None   Collection Time: 02/04/18  4:43 PM  Result Value Ref Range   Procalcitonin <0.10 ng/mL    Comment:        Interpretation: PCT (Procalcitonin) <= 0.5 ng/mL: Systemic infection (sepsis) is not likely. Local bacterial infection is possible. (NOTE)       Sepsis PCT Algorithm           Lower Respiratory Tract                                      Infection PCT Algorithm     ----------------------------     ----------------------------         PCT < 0.25 ng/mL                PCT < 0.10 ng/mL         Strongly encourage             Strongly discourage   discontinuation of antibiotics    initiation of antibiotics    ----------------------------     -----------------------------       PCT 0.25 - 0.50 ng/mL            PCT 0.10 - 0.25 ng/mL               OR       >80% decrease in PCT            Discourage initiation of  antibiotics      Encourage discontinuation           of antibiotics    ----------------------------     -----------------------------         PCT >= 0.50 ng/mL              PCT 0.26 - 0.50 ng/mL               AND        <80% decrease in PCT             Encourage initiation of                                             antibiotics       Encourage continuation           of antibiotics    ----------------------------     -----------------------------        PCT >= 0.50 ng/mL                  PCT > 0.50 ng/mL               AND         increase in PCT                  Strongly encourage                                      initiation of antibiotics    Strongly encourage escalation           of antibiotics                                     -----------------------------                                           PCT <= 0.25 ng/mL                                                 OR                                        > 80% decrease in PCT                                     Discontinue / Do not initiate                                             antibiotics Performed at [facility] [address], [place], [place] [address]   CBC     Status: Abnormal   Collection Time: 02/04/18  4:43  PM  Result Value Ref Range   WBC 10.1 4.0 - 10.5 K/uL   RBC 3.71 (L) 4.22 - 5.81 MIL/uL   Hemoglobin 12.5 (L) 13.0 - 17.0 g/dL   HCT 38.3 (L) 39.0 - 52.0 %   MCV 103.2 (H) 80.0 - 100.0 fL   MCH  33.7 26.0 - 34.0 pg   MCHC 32.6 30.0 - 36.0 g/dL   RDW 12.6 11.5 - 15.5 %   Platelets 221 150 - 400 K/uL   nRBC 0.0 0.0 - 0.2 %    Comment: Performed at [facility], [place] [address] [place], [place] [address]  Creatinine, serum     Status: None   Collection Time: 02/04/18  4:43 PM  Result Value Ref Range   Creatinine, Ser 0.77 0.61 - 1.24 mg/dL   GFR calc non Af Amer >60 >60 mL/min   GFR calc Af Amer >60 >60 mL/min    Comment: Performed at [facility], [place] [address] [place], [place] [address]  Glucose, capillary     Status: Abnormal   Collection Time: 02/04/18  4:44 PM  Result Value Ref Range   Glucose-Capillary 202 (H) 70 - 99 mg/dL  Lactic acid, plasma     Status: Abnormal   Collection Time: 02/04/18  6:31 PM  Result Value Ref Range   Lactic Acid, Venous 3.3 (HH) 0.5 - 1.9 mmol/L    Comment: CRITICAL RESULT CALLED TO, READ BACK BY AND VERIFIED WITH: BULLINS,H AT 1916 ON 02/04/2018 BY MOSLEY,J Performed at [facility], [place] [address] [place], [place] [address]   Basic metabolic panel     Status: Abnormal   Collection Time: 02/05/18  6:10 AM  Result Value Ref Range   Sodium 138 135 - 145 mmol/L    Comment: DELTA CHECK NOTED   Potassium 4.3 3.5 - 5.1 mmol/L   Chloride 105 98 - 111 mmol/L   CO2 24 22 - 32 mmol/L   Glucose, Bld 204 (H) 70 - 99 mg/dL   BUN 20 8 - 23 mg/dL   Creatinine, Ser 0.65 0.61 - 1.24 mg/dL   Calcium 8.4 (L) 8.9 - 10.3 mg/dL   GFR calc non Af Amer >60 >60 mL/min   GFR calc Af Amer >60 >60 mL/min   Anion gap 9 5 - 15    Comment: Performed at [facility], [place] [address], [place], [place] [address]  CBC     Status: Abnormal   Collection Time: 02/05/18  6:10 AM  Result Value Ref Range   WBC 7.1 4.0 - 10.5 K/uL   RBC 3.67 (L) 4.22 - 5.81 MIL/uL   Hemoglobin 12.1 (L) 13.0 - 17.0 g/dL   HCT 37.3 (L) 39.0 - 52.0 %   MCV 101.6 (H) 80.0 - 100.0 fL   MCH 33.0 26.0 - 34.0 pg    MCHC 32.4 30.0 - 36.0 g/dL   RDW 12.6 11.5 - 15.5 %   Platelets 242 150 - 400 K/uL   nRBC 0.0 0.0 - 0.2 %    Comment: Performed at [facility], [place] [address], [place], [place] [address]  Glucose, capillary     Status: Abnormal   Collection Time: 02/05/18  8:03 AM  Result Value Ref Range   Glucose-Capillary 216 (H) 70 - 99 mg/dL  Glucose, capillary     Status: Abnormal   Collection Time: 02/05/18 11:49 AM  Result Value Ref Range   Glucose-Capillary 201 (H) 70 - 99  mg/dL  Glucose, capillary     Status: Abnormal   Collection Time: 02/05/18  4:38 PM  Result Value Ref Range   Glucose-Capillary 221 (H) 70 - 99 mg/dL  Glucose, capillary     Status: Abnormal   Collection Time: 02/05/18  9:14 PM  Result Value Ref Range   Glucose-Capillary 272 (H) 70 - 99 mg/dL  Glucose, capillary     Status: Abnormal   Collection Time: 02/06/18  7:32 AM  Result Value Ref Range   Glucose-Capillary 213 (H) 70 - 99 mg/dL  Glucose, capillary     Status: Abnormal   Collection Time: 02/06/18 11:46 AM  Result Value Ref Range   Glucose-Capillary 307 (H) 70 - 99 mg/dL  CBC w/Diff     Status: Abnormal   Collection Time: 02/13/18 11:37 AM  Result Value Ref Range   WBC 15.3 (H) 4.0 - 10.5 K/uL   RBC 4.09 (L) 4.22 - 5.81 Mil/uL   Hemoglobin 13.7 13.0 - 17.0 g/dL   HCT 40.8 39.0 - 52.0 %   MCV 99.6 78.0 - 100.0 fl   MCHC 33.7 30.0 - 36.0 g/dL   RDW 13.2 11.5 - 15.5 %   Platelets 270.0 150.0 - 400.0 K/uL   Neutrophils Relative % 82.6 (H) 43.0 - 77.0 %   Lymphocytes Relative 11.0 (L) 12.0 - 46.0 %   Monocytes Relative 3.8 3.0 - 12.0 %   Eosinophils Relative 2.1 0.0 - 5.0 %   Basophils Relative 0.5 0.0 - 3.0 %   Neutro Abs 12.7 (H) 1.4 - 7.7 K/uL   Lymphs Abs 1.7 0.7 - 4.0 K/uL   Monocytes Absolute 0.6 0.1 - 1.0 K/uL   Eosinophils Absolute 0.3 0.0 - 0.7 K/uL   Basophils Absolute 0.1 0.0 - 0.1 K/uL  Comp Met (CMET)     Status: Abnormal   Collection Time: 02/13/18 11:37 AM  Result Value  Ref Range   Sodium 132 (L) 135 - 145 mEq/L   Potassium 5.2 (H) 3.5 - 5.1 mEq/L   Chloride 96 96 - 112 mEq/L   CO2 29 19 - 32 mEq/L   Glucose, Bld 162 (H) 70 - 99 mg/dL   BUN 22 6 - 23 mg/dL   Creatinine, Ser 1.14 0.40 - 1.50 mg/dL   Total Bilirubin 0.5 0.2 - 1.2 mg/dL   Alkaline Phosphatase 49 39 - 117 U/L   AST 29 0 - 37 U/L   ALT 43 0 - 53 U/L   Total Protein 6.6 6.0 - 8.3 g/dL   Albumin 3.8 3.5 - 5.2 g/dL   Calcium 9.7 8.4 - 10.5 mg/dL   GFR 65.87 >60.00 mL/min    Assessment/Plan: 1. Essential hypertension BP normotensive. Asymptomatic. Cough improved with change from ACEI to ARB. Repeat BMP today. Continue current regimen. Follow-up discussed.  - Basic metabolic panel  2. Leukocytosis, unspecified type Afebrile. Asymptomatic. Repeat CBC today. - CBC w diff  Leeanne Rio, PA-C

## 2018-03-10 ENCOUNTER — Ambulatory Visit (INDEPENDENT_AMBULATORY_CARE_PROVIDER_SITE_OTHER): Payer: Medicare Other | Admitting: Physician Assistant

## 2018-03-10 ENCOUNTER — Other Ambulatory Visit: Payer: Self-pay

## 2018-03-10 ENCOUNTER — Encounter: Payer: Self-pay | Admitting: Physician Assistant

## 2018-03-10 VITALS — BP 120/60 | HR 82 | Temp 97.5°F | Resp 14 | Ht 67.0 in | Wt 178.0 lb

## 2018-03-10 DIAGNOSIS — R1310 Dysphagia, unspecified: Secondary | ICD-10-CM | POA: Insufficient documentation

## 2018-03-10 DIAGNOSIS — I471 Supraventricular tachycardia: Secondary | ICD-10-CM | POA: Insufficient documentation

## 2018-03-10 DIAGNOSIS — Z0000 Encounter for general adult medical examination without abnormal findings: Secondary | ICD-10-CM

## 2018-03-10 DIAGNOSIS — F17210 Nicotine dependence, cigarettes, uncomplicated: Secondary | ICD-10-CM

## 2018-03-10 DIAGNOSIS — I10 Essential (primary) hypertension: Secondary | ICD-10-CM | POA: Diagnosis not present

## 2018-03-10 DIAGNOSIS — R0789 Other chest pain: Secondary | ICD-10-CM | POA: Diagnosis not present

## 2018-03-10 DIAGNOSIS — E118 Type 2 diabetes mellitus with unspecified complications: Secondary | ICD-10-CM

## 2018-03-10 DIAGNOSIS — E782 Mixed hyperlipidemia: Secondary | ICD-10-CM | POA: Diagnosis not present

## 2018-03-10 DIAGNOSIS — IMO0001 Reserved for inherently not codable concepts without codable children: Secondary | ICD-10-CM

## 2018-03-10 DIAGNOSIS — Z794 Long term (current) use of insulin: Secondary | ICD-10-CM

## 2018-03-10 LAB — CBC WITH DIFFERENTIAL/PLATELET
Basophils Absolute: 0 10*3/uL (ref 0.0–0.1)
Basophils Relative: 0.5 % (ref 0.0–3.0)
Eosinophils Absolute: 0.1 10*3/uL (ref 0.0–0.7)
Eosinophils Relative: 1.9 % (ref 0.0–5.0)
HCT: 41.9 % (ref 39.0–52.0)
Hemoglobin: 14.3 g/dL (ref 13.0–17.0)
Lymphocytes Relative: 17.5 % (ref 12.0–46.0)
Lymphs Abs: 1.3 10*3/uL (ref 0.7–4.0)
MCHC: 34.2 g/dL (ref 30.0–36.0)
MCV: 99.1 fl (ref 78.0–100.0)
Monocytes Absolute: 0.4 10*3/uL (ref 0.1–1.0)
Monocytes Relative: 5.6 % (ref 3.0–12.0)
Neutro Abs: 5.6 10*3/uL (ref 1.4–7.7)
Neutrophils Relative %: 74.5 % (ref 43.0–77.0)
Platelets: 253 10*3/uL (ref 150.0–400.0)
RBC: 4.23 Mil/uL (ref 4.22–5.81)
RDW: 14.1 % (ref 11.5–15.5)
WBC: 7.5 10*3/uL (ref 4.0–10.5)

## 2018-03-10 LAB — LIPID PANEL
CHOLESTEROL: 143 mg/dL (ref 0–200)
HDL: 35.8 mg/dL — ABNORMAL LOW (ref >39.00)
LDL Cholesterol: 92 mg/dL (ref 0–99)
NonHDL: 107.53
Total CHOL/HDL Ratio: 4
Triglycerides: 80 mg/dL (ref 0.0–149.0)
VLDL: 16 mg/dL (ref 0.0–40.0)

## 2018-03-10 LAB — COMPREHENSIVE METABOLIC PANEL
ALT: 31 U/L (ref 0–53)
AST: 23 U/L (ref 0–37)
Albumin: 4.5 g/dL (ref 3.5–5.2)
Alkaline Phosphatase: 44 U/L (ref 39–117)
BUN: 20 mg/dL (ref 6–23)
CO2: 30 mEq/L (ref 19–32)
CREATININE: 0.81 mg/dL (ref 0.40–1.50)
Calcium: 9.7 mg/dL (ref 8.4–10.5)
Chloride: 97 mEq/L (ref 96–112)
GFR: 91.92 mL/min (ref >60.00)
Glucose, Bld: 224 mg/dL — ABNORMAL HIGH (ref 70–99)
Potassium: 4.4 mEq/L (ref 3.5–5.1)
Sodium: 136 mEq/L (ref 135–145)
Total Bilirubin: 0.6 mg/dL (ref 0.2–1.2)
Total Protein: 7 g/dL (ref 6.0–8.3)

## 2018-03-10 LAB — HEMOGLOBIN A1C: Hgb A1c MFr Bld: 8.2 % — ABNORMAL HIGH (ref 4.6–6.5)

## 2018-03-10 LAB — MICROALBUMIN / CREATININE URINE RATIO
Creatinine,U: 57.7 mg/dL
Microalb Creat Ratio: 1.2 mg/g (ref 0.0–30.0)

## 2018-03-10 NOTE — Assessment & Plan Note (Signed)
BP normotensive today. Continue current regimen. Repeat labs.

## 2018-03-10 NOTE — Assessment & Plan Note (Signed)
Noted on assessment by student. Notes occasional choking/coughing with swallowing. Higher risk for aspiration and aspiration pneumonia. Order for swallow study placed.

## 2018-03-10 NOTE — Patient Instructions (Signed)
-Please go to the lab for blood work.  -Our office will call you with your results unless you have chosen to receive results via MyChart. -If your blood work is normal we will follow-up each year for physicals and as scheduled for chronic medical problems. -If anything is abnormal we will treat accordingly and get you in for a follow-up.  -Please schedule a follow-up with your Urologist for ongoing issues.   -You will be contacted for a swallow study so we can make sure you are not choking/aspirating on food, water or medicine. This increases risk for pneumonia  Please go to the [facility] office for x-ray. We will call you with your results and alter treatment accordingly.   [address]  [place], [place] [address]   Preventive Care 79 Years and Older, Male Preventive care refers to lifestyle choices and visits with your health care provider that can promote health and wellness. What does preventive care include?   A yearly physical exam. This is also called an annual well check.  Dental exams once or twice a year.  Routine eye exams. Ask your health care provider how often you should have your eyes checked.  Personal lifestyle choices, including: ? Daily care of your teeth and gums. ? Regular physical activity. ? Eating a healthy diet. ? Avoiding tobacco and drug use. ? Limiting alcohol use. ? Practicing safe sex. ? Taking low doses of aspirin every day. ? Taking vitamin and mineral supplements as recommended by your health care provider. What happens during an annual well check? The services and screenings done by your health care provider during your annual well check will depend on your age, overall health, lifestyle risk factors, and family history of disease. Counseling Your health care provider may ask you questions about your:  Alcohol use.  Tobacco use.  Drug use.  Emotional well-being.  Home and relationship  well-being.  Sexual activity.  Eating habits.  History of falls.  Memory and ability to understand (cognition).  Work and work Statistician. Screening You may have the following tests or measurements:  Height, weight, and BMI.  Blood pressure.  Lipid and cholesterol levels. These may be checked every 5 years, or more frequently if you are over 21 years old.  Skin check.  Lung cancer screening. You may have this screening every year starting at age 79 if you have a 30-pack-year history of smoking and currently smoke or have quit within the past 15 years.  Colorectal cancer screening. All adults should have this screening starting at age 79 and continuing until age 79. You will have tests every 1-10 years, depending on your results and the type of screening test. People at increased risk should start screening at an earlier age. Screening tests may include: ? Guaiac-based fecal occult blood testing. ? Fecal immunochemical test (FIT). ? Stool DNA test. ? Virtual colonoscopy. ? Sigmoidoscopy. During this test, a flexible tube with a tiny camera (sigmoidoscope) is used to examine your rectum and lower colon. The sigmoidoscope is inserted through your anus into your rectum and lower colon. ? Colonoscopy. During this test, a long, thin, flexible tube with a tiny camera (colonoscope) is used to examine your entire colon and rectum.  Prostate cancer screening. Recommendations will vary depending on your family history and other risks.  Hepatitis C blood test.  Hepatitis B blood test.  Sexually transmitted disease (STD) testing.  Diabetes screening. This is done by checking your blood sugar (glucose) after  you have not eaten for a while (fasting). You may have this done every 1-3 years.  Abdominal aortic aneurysm (AAA) screening. You may need this if you are a current or former smoker.  Osteoporosis. You may be screened starting at age 79 if you are at high risk. Talk with your  health care provider about your test results, treatment options, and if necessary, the need for more tests. Vaccines Your health care provider may recommend certain vaccines, such as:  Influenza vaccine. This is recommended every year.  Tetanus, diphtheria, and acellular pertussis (Tdap, Td) vaccine. You may need a Td booster every 10 years.  Varicella vaccine. You may need this if you have not been vaccinated.  Zoster vaccine. You may need this after age 60.  Measles, mumps, and rubella (MMR) vaccine. You may need at least one dose of MMR if you were born in 1957 or later. You may also need a second dose.  Pneumococcal 13-valent conjugate (PCV13) vaccine. One dose is recommended after age 79.  Pneumococcal polysaccharide (PPSV23) vaccine. One dose is recommended after age 79.  Meningococcal vaccine. You may need this if you have certain conditions.  Hepatitis A vaccine. You may need this if you have certain conditions or if you travel or work in places where you may be exposed to hepatitis A.  Hepatitis B vaccine. You may need this if you have certain conditions or if you travel or work in places where you may be exposed to hepatitis B.  Haemophilus influenzae type b (Hib) vaccine. You may need this if you have certain risk factors. Talk to your health care provider about which screenings and vaccines you need and how often you need them. This information is not intended to replace advice given to you by your health care provider. Make sure you discuss any questions you have with your health care provider. Document Released: 03/07/2015 Document Revised: 03/31/2017 Document Reviewed: 12/10/2014 [employer] Interactive Patient Education  2019 [employer]    .      

## 2018-03-10 NOTE — Progress Notes (Signed)
Patient presents to clinic today for annual exam.  Patient is fasting for labs.   Acute Concerns: Patient with episodes of getting choked on food. Denies any odynophagia. Typically liquids and not solid foods when this occurs. Does not happen often but is worrisome to him. Notes his GERD symptoms are well-controlled currently.   Patient also noting intermittent left chest pain described as pulling or aching in nature. Recently had follow-up with cardiology and was told that symptoms were not cardiac in nature. Denies any skin changes, rash. Denies any heavy lifting, overexertion, trauma or injury.   Health Maintenance: Immunizations -- up-to-date Colonoscopy -- up-to-date.  Past Medical History:  Diagnosis Date  . 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 Arthritis    "lower back" (07/29/2016)  . ASCVD (arteriosclerotic cardiovascular disease)    -luminal irregularities in 1998 and 2005; normal EF  . Benign prostatic hypertrophy    status post transurethral resection of the prostate  . CAD (coronary artery disease)    a. Cath 07/29/16 99% ostial RCA & 70% pRCA s/p cutting balloon angioplasty and single SYNERGY DES 2.5X28. 100% very Distal 1st RPLB lesion --> the occlusion site moved further distal with guidewire advancement;  40% ostial LAD; 50% Ost Cx to Prox Cx lesion.  . Coronary artery disease   . Dysphagia   . Erectile dysfunction   . GERD (gastroesophageal reflux disease)   . Heart murmur   . History of hiatal hernia   . History of kidney stones   . Hyperlipidemia   . Hypertension   . Seasonal allergies   . Tobacco abuse    20 pack years; discontinued 1995  . Type II diabetes mellitus ([place])     Past Surgical History:  Procedure Laterality Date  . APPENDECTOMY    . CATARACT EXTRACTION W/ INTRAOCULAR LENS  IMPLANT, BILATERAL Bilateral   . COLONOSCOPY  07/20/2011   Dr. Gala Romney: multiple hyperplastic polyps. Surveillance 2018  . COLONOSCOPY N/A 06/30/2016   Procedure:  COLONOSCOPY;  Surgeon: Daneil Dolin, MD;  [facility];  Service: Endoscopy;  Laterality: N/A;  10:00am  . COLONOSCOPY W/ BIOPSIES AND POLYPECTOMY  07/08/08, 06/2011   Dr Madolyn Frieze papilla, diminutive rectal polyp ablated the, left-sided diverticula, piecemeal polypectomy of hepatic flexure adenomatous polyp, diminutive polyps near hepatic flexure ablated  . CORONARY ANGIOPLASTY WITH STENT PLACEMENT  07/29/2016  . CORONARY STENT INTERVENTION N/A 07/29/2016   Procedure: Coronary Stent Intervention;  Surgeon: Leonie Man, MD;  Location: [facility];  Service: Cardiovascular;  Laterality: N/A;  RCA  . ESOPHAGOGASTRODUODENOSCOPY  2013   erosive reflux esophagitis, s/p empiric dilation. chronic duodenitis.   [name] LEFT HEART CATH AND CORONARY ANGIOGRAPHY N/A 07/29/2016   Procedure: Left Heart Cath and Coronary Angiography;  Surgeon: Leonie Man, MD;  Location: [facility];  Service: Cardiovascular;  Laterality: N/A;  . NASAL SEPTUM SURGERY    . NASAL SINUS SURGERY     "cleaned out my sinus"  . PERIPHERAL VASCULAR CATHETERIZATION N/A 11/26/2015   Procedure: Abdominal Aortogram w/Lower Extremity;  Surgeon: Wellington Hampshire, MD;  Location: [facility];  Service: Cardiovascular;  Laterality: N/A;  . TONSILLECTOMY    . TRANSURETHRAL RESECTION OF PROSTATE  2009    Current Outpatient Medications on File Prior to Visit  Medication Sig Dispense Refill  . ACCU-CHEK AVIVA PLUS test strip USE TO TEST BLOOD SUAGR TWICE DAILY 200 each 2  . acetaminophen (TYLENOL) 325 MG tablet Take 325 mg  by mouth 2 (two) times daily as needed for mild pain.    [name] albuterol (PROVENTIL HFA;VENTOLIN HFA) 108 (90 Base) MCG/ACT inhaler Inhale 2 puffs into the lungs every 6 (six) hours as needed for wheezing or shortness of breath. 1 Inhaler 0  . alfuzosin (UROXATRAL) 10 MG 24 hr tablet Take 10 mg by mouth daily with breakfast.    . amLODipine (NORVASC) 5 MG tablet Take 1 tablet (5 mg total) by  mouth daily. 90 tablet 1  . aspirin EC 81 MG tablet Take 81 mg by mouth at bedtime.     [name] atorvastatin (LIPITOR) 10 MG tablet Take 1 tablet (10 mg total) by mouth at bedtime.    [name] azelastine (ASTELIN) 0.1 % nasal spray Place 2 sprays into both nostrils 2 (two) times daily. Use in each nostril as directed 30 mL 12  . docusate sodium (COLACE) 100 MG capsule Take 100 mg by mouth as needed for mild constipation.     [name] glucose blood test strip Accu-Chek Aviva Plus test strips    . Insulin Pen Needle (B-D ULTRAFINE III SHORT PEN) 31G X 8 MM MISC Inject insulin SQ daily Dx:E11.8 100 each 3  . JANUVIA 100 MG tablet TAKE 1 TABLET(100 MG) BY MOUTH DAILY (Patient taking differently: Take 100 mg by mouth daily. ) 90 tablet 1  . LANTUS SOLOSTAR 100 UNIT/ML Solostar Pen Inject 20 Units into the skin daily at 10 pm. Increase by 2 units every 3 days (Patient taking differently: Inject 24 Units into the skin daily at 10 pm. Increase by 2 units every 3 days)    . losartan (COZAAR) 25 MG tablet TAKE 1 TABLET(25 MG) BY MOUTH DAILY 90 tablet 1  . Magnesium 200 MG TABS Take 1 tablet (200 mg total) by mouth daily. 30 each 6  . metFORMIN (GLUCOPHAGE) 1000 MG tablet TAKE 1 TABLET(1000 MG) BY MOUTH TWICE DAILY WITH A MEAL (Patient taking differently: Take 1,000 mg by mouth 2 (two) times daily with a meal. ) 180 tablet 0  . metoprolol tartrate (LOPRESSOR) 25 MG tablet TAKE 1 TABLET(25 MG) BY MOUTH TWICE DAILY (Patient taking differently: Take 25 mg by mouth 2 (two) times daily. ) 180 tablet 1  . pantoprazole (PROTONIX) 40 MG tablet Take 1 tablet (40 mg total) by mouth daily. 30 tablet 3  . tadalafil (CIALIS) 20 MG tablet Take 0.5-1 tablets (10-20 mg total) by mouth every other day as needed for erectile dysfunction. 5 tablet 1   No current facility-administered medications on file prior to visit.     Allergies  Allergen Reactions  . Plavix [Clopidogrel Bisulfate] Shortness Of Breath  . Oxycontin [Oxycodone Hcl] Other  (See Comments)    Malaise  . Penicillins Other (See Comments)    [place] Has patient had a PCN reaction causing immediate rash, facial/tongue/throat swelling, SOB or lightheadedness with hypotension: No Has patient had a PCN reaction causing severe rash involving mucus membranes or skin necrosis: No Has patient had a PCN reaction that required hospitalization No Has patient had a PCN reaction occurring within the last 10 years: No If all of the above answers are "NO", then may proceed with Cephalosporin use.   . Pioglitazone Other (See Comments)    Made his chest hurt  . Prednisone Other (See Comments)    Reaction: muscle aches and soreness  . Simvastatin Other (See Comments)    Myalgias    Family History  Problem Relation Age of Onset  . Ovarian cancer  Mother   . Colon cancer Sister 10       deceased from colon cancer    Social History   Socioeconomic History  . Marital status: Married    Spouse name: Not on file  . Number of children: 2  . Years of education: Not on file  . Highest education level: Not on file  Occupational History  . Occupation: Dietitian, 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    Comment: Gaffer of agriculture  Social Needs  . Financial resource strain: Not on file  . Food insecurity:    Worry: Not on file    Inability: Not on file  . Transportation needs:    Medical: Not on file    Non-medical: Not on file  Tobacco Use  . Smoking status: Former Smoker    Packs/day: 1.00    Years: 15.00    Pack years: 15.00    Types: Cigarettes    Start date: 03/08/1965    Last attempt to quit: 07/07/1993    Years since quitting: 24.6  . Smokeless tobacco: Never Used  Substance and Sexual Activity  . Alcohol use: No    Alcohol/week: 0.0 standard drinks    Frequency: Never    Comment: 07/29/2016 "last drink was 4 wks ago; had a beer"  . Drug use: No  . Sexual activity: Not Currently  Lifestyle  . Physical activity:    Days per week: Not on file     Minutes per session: Not on file  . Stress: Not on file  Relationships  . Social connections:    Talks on phone: Not on file    Gets together: Not on file    Attends religious service: Not on file    Active member of club or organization: Not on file    Attends meetings of clubs or organizations: Not on file    Relationship status: Not on file  . Intimate partner violence:    Fear of current or ex partner: Not on file    Emotionally abused: Not on file    Physically abused: Not on file    Forced sexual activity: Not on file  Other Topics Concern  . Not on file  Social History Narrative  . Not on file   Review of Systems  Constitutional: Negative for fever and weight loss.  HENT: Negative for ear discharge, ear pain, hearing loss and tinnitus.   Eyes: Negative for blurred vision, double vision, photophobia and pain.  Respiratory: Negative for cough and shortness of breath.   Cardiovascular: Positive for chest pain (left-side occasional with inspiration. Has had Cardiology assessment of this). Negative for palpitations.  Gastrointestinal: Negative for abdominal pain, blood in stool, constipation, diarrhea, heartburn, melena, nausea and vomiting.  Genitourinary: Negative for dysuria, flank pain, frequency, hematuria and urgency.       Nocturia x 3 (Hsa BPH - followed by Urology)  Musculoskeletal: Negative for falls.  Neurological: Negative for dizziness, loss of consciousness and headaches.       Occasional lightheadedness  Endo/Heme/Allergies: Negative for environmental allergies.  Psychiatric/Behavioral: Negative for depression, hallucinations, substance abuse and suicidal ideas. The patient has insomnia. The patient is not nervous/anxious.    BP 120/60   Pulse 82   Temp (!) 97.5 F (36.4 C) (Oral)   Resp 14   Ht '5\' 7"'  (1.702 m)   Wt 178 lb (80.7 kg)   SpO2 97%   BMI 27.88 kg/m   Physical Exam Vitals signs reviewed.  Constitutional:  Appearance: Normal appearance.    HENT:     Head: Normocephalic and atraumatic.     Right Ear: Tympanic membrane normal.     Left Ear: Tympanic membrane normal.     Nose: No congestion.  Eyes:     Conjunctiva/sclera: Conjunctivae normal.  Neck:     Musculoskeletal: Neck supple.  Cardiovascular:     Rate and Rhythm: Normal rate and regular rhythm.     Pulses: Normal pulses.     Heart sounds: Normal heart sounds.  Pulmonary:     Effort: Pulmonary effort is normal.     Breath sounds: Normal breath sounds.  Neurological:     General: No focal deficit present.     Mental Status: He is alert and oriented to person, place, and time.  Psychiatric:        Mood and Affect: Mood normal.     Recent Results (from the past 2160 hour(s))  Fructosamine     Status: None   Collection Time: 12/20/17  9:45 AM  Result Value Ref Range   Fructosamine 281 205 - 285 umol/L  Basic metabolic panel     Status: Abnormal   Collection Time: 12/20/17  9:45 AM  Result Value Ref Range   Sodium 136 135 - 145 mEq/L   Potassium 5.0 3.5 - 5.1 mEq/L   Chloride 98 96 - 112 mEq/L   CO2 31 19 - 32 mEq/L   Glucose, Bld 161 (H) 70 - 99 mg/dL   BUN 26 (H) 6 - 23 mg/dL   Creatinine, Ser 0.84 0.40 - 1.50 mg/dL   Calcium 9.8 8.4 - 10.5 mg/dL   GFR 93.74 >60.00 mL/min  CBC with Differential/Platelet     Status: Abnormal   Collection Time: 02/04/18 12:40 PM  Result Value Ref Range   WBC 11.8 (H) 4.0 - 10.5 K/uL   RBC 4.07 (L) 4.22 - 5.81 MIL/uL   Hemoglobin 13.5 13.0 - 17.0 g/dL   HCT 41.6 39.0 - 52.0 %   MCV 102.2 (H) 80.0 - 100.0 fL   MCH 33.2 26.0 - 34.0 pg   MCHC 32.5 30.0 - 36.0 g/dL   RDW 12.6 11.5 - 15.5 %   Platelets 250 150 - 400 K/uL   nRBC 0.0 0.0 - 0.2 %   Neutrophils Relative % 78 %   Neutro Abs 9.1 (H) 1.7 - 7.7 K/uL   Lymphocytes Relative 10 %   Lymphs Abs 1.1 0.7 - 4.0 K/uL   Monocytes Relative 12 %   Monocytes Absolute 1.5 (H) 0.1 - 1.0 K/uL   Eosinophils Relative 0 %   Eosinophils Absolute 0.0 0.0 - 0.5 K/uL    Basophils Relative 0 %   Basophils Absolute 0.1 0.0 - 0.1 K/uL   WBC Morphology INCREASED BANDS (>20% BANDS)    Immature Granulocytes 0 %   Abs Immature Granulocytes 0.03 0.00 - 0.07 K/uL    Comment: Performed at [facility], [place] [address] [place], [place] [address]  Comprehensive metabolic panel     Status: Abnormal   Collection Time: 02/04/18 12:40 PM  Result Value Ref Range   Sodium 130 (L) 135 - 145 mmol/L   Potassium 4.5 3.5 - 5.1 mmol/L   Chloride 93 (L) 98 - 111 mmol/L   CO2 24 22 - 32 mmol/L   Glucose, Bld 196 (H) 70 - 99 mg/dL   BUN 28 (H) 8 - 23 mg/dL   Creatinine, Ser 0.74 0.61 - 1.24 mg/dL   Calcium 9.2  8.9 - 10.3 mg/dL   Total Protein 7.4 6.5 - 8.1 g/dL   Albumin 4.1 3.5 - 5.0 g/dL   AST 19 15 - 41 U/L   ALT 20 0 - 44 U/L   Alkaline Phosphatase 49 38 - 126 U/L   Total Bilirubin 1.4 (H) 0.3 - 1.2 mg/dL   GFR calc non Af Amer >60 >60 mL/min   GFR calc Af Amer >60 >60 mL/min   Anion gap 13 5 - 15    Comment: Performed at [facility], [place] [address], [place], [place] [address]  Blood culture (routine x 2)     Status: None   Collection Time: 02/04/18 12:40 PM  Result Value Ref Range   Specimen Description      BLOOD LEFT FOREARM Performed at [facility] [address], [place], [place] [address]    Special Requests      BOTTLES DRAWN AEROBIC AND ANAEROBIC Blood Culture adequate volume Performed at [facility] [address], [place], [place] [address]    Culture      NO GROWTH 5 DAYS Performed at [facility], [place] [address], [place], [place] [address]    Report Status 02/09/2018 FINAL   Blood culture (routine x 2)     Status: None   Collection Time: 02/04/18 12:40 PM  Result Value Ref Range   Specimen Description      BLOOD RIGHT FOREARM Performed at [facility] [address] [place], [place] [address]    Special Requests      BOTTLES DRAWN  AEROBIC AND ANAEROBIC Blood Culture adequate volume Performed at [facility] [address], [place], [place] [address]    Culture      NO GROWTH 5 DAYS Performed at [facility], [place] [address], [place], [place] [address]    Report Status 02/09/2018 FINAL   I-stat troponin, ED     Status: None   Collection Time: 02/04/18 12:49 PM  Result Value Ref Range   Troponin i, poc 0.02 0.00 - 0.08 ng/mL   Comment 3            Comment: Due to the release kinetics of cTnI, a negative result within the first hours of the onset of symptoms does not rule out myocardial infarction with certainty. If myocardial infarction is still suspected, repeat the test at appropriate intervals.   I-Stat CG4 Lactic Acid, ED     Status: Abnormal   Collection Time: 02/04/18 12:51 PM  Result Value Ref Range   Lactic Acid, Venous 2.81 (HH) 0.5 - 1.9 mmol/L   Comment NOTIFIED PHYSICIAN   Influenza panel by PCR (type A & B)     Status: None   Collection Time: 02/04/18  1:45 PM  Result Value Ref Range   Influenza A By PCR NEGATIVE NEGATIVE   Influenza B By PCR NEGATIVE NEGATIVE    Comment: (NOTE) The Xpert Xpress Flu assay is intended as an aid in the diagnosis of  influenza and should not be used as a sole basis for treatment.  This  assay is FDA approved for nasopharyngeal swab specimens only. Nasal  washings and aspirates are unacceptable for Xpert Xpress Flu testing. Performed at [facility], [place] [address] [place],  [address]   I-Stat CG4 Lactic Acid, ED     Status: Abnormal   Collection Time: 02/04/18  2:28 PM  Result Value Ref Range  Lactic Acid, Venous 3.68 (HH) 0.5 - 1.9 mmol/L   Comment NOTIFIED PHYSICIAN   Urinalysis, Routine w reflex microscopic     Status: Abnormal   Collection Time: 02/04/18  2:47 PM  Result Value Ref Range   Color, Urine YELLOW YELLOW   APPearance CLEAR CLEAR   Specific Gravity, Urine 1.016 1.005 - 1.030   pH 5.0 5.0  - 8.0   Glucose, UA >=500 (A) NEGATIVE mg/dL   Hgb urine dipstick NEGATIVE NEGATIVE   Bilirubin Urine NEGATIVE NEGATIVE   Ketones, ur 5 (A) NEGATIVE mg/dL   Protein, ur NEGATIVE NEGATIVE mg/dL   Nitrite NEGATIVE NEGATIVE   Leukocytes, UA NEGATIVE NEGATIVE   RBC / HPF 0-5 0 - 5 RBC/hpf   WBC, UA 0-5 0 - 5 WBC/hpf   Bacteria, UA NONE SEEN NONE SEEN   Squamous Epithelial / LPF 0-5 0 - 5   Mucus PRESENT    Hyaline Casts, UA PRESENT     Comment: Performed at [facility], [place] [address], [place], [place] [address]  Lactic acid, plasma     Status: Abnormal   Collection Time: 02/04/18  4:43 PM  Result Value Ref Range   Lactic Acid, Venous 3.1 (HH) 0.5 - 1.9 mmol/L    Comment: CRITICAL RESULT CALLED TO, READ BACK BY AND VERIFIED WITH: DUNKELBERGER,K AT 1723 ON 02/04/2018 BY MOSLEY,J Performed at [facility], [place] [address], [place], [place] [address]   Procalcitonin     Status: None   Collection Time: 02/04/18  4:43 PM  Result Value Ref Range   Procalcitonin <0.10 ng/mL    Comment:        Interpretation: PCT (Procalcitonin) <= 0.5 ng/mL: Systemic infection (sepsis) is not likely. Local bacterial infection is possible. (NOTE)       Sepsis PCT Algorithm           Lower Respiratory Tract                                      Infection PCT Algorithm    ----------------------------     ----------------------------         PCT < 0.25 ng/mL                PCT < 0.10 ng/mL         Strongly encourage             Strongly discourage   discontinuation of antibiotics    initiation of antibiotics    ----------------------------     -----------------------------       PCT 0.25 - 0.50 ng/mL            PCT 0.10 - 0.25 ng/mL               OR       >80% decrease in PCT            Discourage initiation of                                            antibiotics      Encourage discontinuation           of antibiotics    ----------------------------      -----------------------------         PCT >=  0.50 ng/mL              PCT 0.26 - 0.50 ng/mL               AND        <80% decrease in PCT             Encourage initiation of                                             antibiotics       Encourage continuation           of antibiotics    ----------------------------     -----------------------------        PCT >= 0.50 ng/mL                  PCT > 0.50 ng/mL               AND         increase in PCT                  Strongly encourage                                      initiation of antibiotics    Strongly encourage escalation           of antibiotics                                     -----------------------------                                           PCT <= 0.25 ng/mL                                                 OR                                        > 80% decrease in PCT                                     Discontinue / Do not initiate                                             antibiotics Performed at [facility] [address] [place], [place] [address]   CBC     Status: Abnormal   Collection Time: 02/04/18  4:43 PM  Result Value Ref Range   WBC 10.1 4.0 - 10.5 K/uL   RBC 3.71 (L) 4.22 - 5.81 MIL/uL   Hemoglobin 12.5 (L) 13.0 - 17.0 g/dL   HCT 38.3 (L) 39.0 -  52.0 %   MCV 103.2 (H) 80.0 - 100.0 fL   MCH 33.7 26.0 - 34.0 pg   MCHC 32.6 30.0 - 36.0 g/dL   RDW 12.6 11.5 - 15.5 %   Platelets 221 150 - 400 K/uL   nRBC 0.0 0.0 - 0.2 %    Comment: Performed at [facility], [place] [address], [place], [place] [address]  Creatinine, serum     Status: None   Collection Time: 02/04/18  4:43 PM  Result Value Ref Range   Creatinine, Ser 0.77 0.61 - 1.24 mg/dL   GFR calc non Af Amer >60 >60 mL/min   GFR calc Af Amer >60 >60 mL/min    Comment: Performed at [facility], [place] [address] [place], [place] [address]  Glucose, capillary     Status: Abnormal    Collection Time: 02/04/18  4:44 PM  Result Value Ref Range   Glucose-Capillary 202 (H) 70 - 99 mg/dL  Lactic acid, plasma     Status: Abnormal   Collection Time: 02/04/18  6:31 PM  Result Value Ref Range   Lactic Acid, Venous 3.3 (HH) 0.5 - 1.9 mmol/L    Comment: CRITICAL RESULT CALLED TO, READ BACK BY AND VERIFIED WITH: BULLINS,H AT 1916 ON 02/04/2018 BY MOSLEY,J Performed at [facility], [place] [address] [place], [place] [address]   Basic metabolic panel     Status: Abnormal   Collection Time: 02/05/18  6:10 AM  Result Value Ref Range   Sodium 138 135 - 145 mmol/L    Comment: DELTA CHECK NOTED   Potassium 4.3 3.5 - 5.1 mmol/L   Chloride 105 98 - 111 mmol/L   CO2 24 22 - 32 mmol/L   Glucose, Bld 204 (H) 70 - 99 mg/dL   BUN 20 8 - 23 mg/dL   Creatinine, Ser 0.65 0.61 - 1.24 mg/dL   Calcium 8.4 (L) 8.9 - 10.3 mg/dL   GFR calc non Af Amer >60 >60 mL/min   GFR calc Af Amer >60 >60 mL/min   Anion gap 9 5 - 15    Comment: Performed at [facility], [place] [address] [place], [place] [address]  CBC     Status: Abnormal   Collection Time: 02/05/18  6:10 AM  Result Value Ref Range   WBC 7.1 4.0 - 10.5 K/uL   RBC 3.67 (L) 4.22 - 5.81 MIL/uL   Hemoglobin 12.1 (L) 13.0 - 17.0 g/dL   HCT 37.3 (L) 39.0 - 52.0 %   MCV 101.6 (H) 80.0 - 100.0 fL   MCH 33.0 26.0 - 34.0 pg   MCHC 32.4 30.0 - 36.0 g/dL   RDW 12.6 11.5 - 15.5 %   Platelets 242 150 - 400 K/uL   nRBC 0.0 0.0 - 0.2 %    Comment: Performed at [facility], [place] [address] [place],  [address]  Glucose, capillary     Status: Abnormal   Collection Time: 02/05/18  8:03 AM  Result Value Ref Range   Glucose-Capillary 216 (H) 70 - 99 mg/dL  Glucose, capillary     Status: Abnormal   Collection Time: 02/05/18 11:49 AM  Result Value Ref Range   Glucose-Capillary 201 (H) 70 - 99 mg/dL  Glucose, capillary     Status: Abnormal   Collection Time: 02/05/18  4:38 PM  Result  Value Ref Range   Glucose-Capillary 221 (H) 70 - 99 mg/dL  Glucose, capillary  Status: Abnormal   Collection Time: 02/05/18  9:14 PM  Result Value Ref Range   Glucose-Capillary 272 (H) 70 - 99 mg/dL  Glucose, capillary     Status: Abnormal   Collection Time: 02/06/18  7:32 AM  Result Value Ref Range   Glucose-Capillary 213 (H) 70 - 99 mg/dL  Glucose, capillary     Status: Abnormal   Collection Time: 02/06/18 11:46 AM  Result Value Ref Range   Glucose-Capillary 307 (H) 70 - 99 mg/dL  CBC w/Diff     Status: Abnormal   Collection Time: 02/13/18 11:37 AM  Result Value Ref Range   WBC 15.3 (H) 4.0 - 10.5 K/uL   RBC 4.09 (L) 4.22 - 5.81 Mil/uL   Hemoglobin 13.7 13.0 - 17.0 g/dL   HCT 40.8 39.0 - 52.0 %   MCV 99.6 78.0 - 100.0 fl   MCHC 33.7 30.0 - 36.0 g/dL   RDW 13.2 11.5 - 15.5 %   Platelets 270.0 150.0 - 400.0 K/uL   Neutrophils Relative % 82.6 (H) 43.0 - 77.0 %   Lymphocytes Relative 11.0 (L) 12.0 - 46.0 %   Monocytes Relative 3.8 3.0 - 12.0 %   Eosinophils Relative 2.1 0.0 - 5.0 %   Basophils Relative 0.5 0.0 - 3.0 %   Neutro Abs 12.7 (H) 1.4 - 7.7 K/uL   Lymphs Abs 1.7 0.7 - 4.0 K/uL   Monocytes Absolute 0.6 0.1 - 1.0 K/uL   Eosinophils Absolute 0.3 0.0 - 0.7 K/uL   Basophils Absolute 0.1 0.0 - 0.1 K/uL  Comp Met (CMET)     Status: Abnormal   Collection Time: 02/13/18 11:37 AM  Result Value Ref Range   Sodium 132 (L) 135 - 145 mEq/L   Potassium 5.2 (H) 3.5 - 5.1 mEq/L   Chloride 96 96 - 112 mEq/L   CO2 29 19 - 32 mEq/L   Glucose, Bld 162 (H) 70 - 99 mg/dL   BUN 22 6 - 23 mg/dL   Creatinine, Ser 1.14 0.40 - 1.50 mg/dL   Total Bilirubin 0.5 0.2 - 1.2 mg/dL   Alkaline Phosphatase 49 39 - 117 U/L   AST 29 0 - 37 U/L   ALT 43 0 - 53 U/L   Total Protein 6.6 6.0 - 8.3 g/dL   Albumin 3.8 3.5 - 5.2 g/dL   Calcium 9.7 8.4 - 10.5 mg/dL   GFR 65.87 >60.00 mL/min  CBC w/Diff     Status: Abnormal   Collection Time: 03/02/18  9:37 AM  Result Value Ref Range   WBC 5.5 4.0 -  10.5 K/uL   RBC 4.18 (L) 4.22 - 5.81 Mil/uL   Hemoglobin 14.1 13.0 - 17.0 g/dL   HCT 42.0 39.0 - 52.0 %   MCV 100.4 (H) 78.0 - 100.0 fl   MCHC 33.6 30.0 - 36.0 g/dL   RDW 14.2 11.5 - 15.5 %   Platelets 282.0 150.0 - 400.0 K/uL   Neutrophils Relative % 65.7 43.0 - 77.0 %   Lymphocytes Relative 21.9 12.0 - 46.0 %   Monocytes Relative 6.4 3.0 - 12.0 %   Eosinophils Relative 5.1 (H) 0.0 - 5.0 %   Basophils Relative 0.9 0.0 - 3.0 %   Neutro Abs 3.6 1.4 - 7.7 K/uL   Lymphs Abs 1.2 0.7 - 4.0 K/uL   Monocytes Absolute 0.4 0.1 - 1.0 K/uL   Eosinophils Absolute 0.3 0.0 - 0.7 K/uL   Basophils Absolute 0.1 0.0 - 0.1 K/uL  Basic metabolic panel  Status: Abnormal   Collection Time: 03/02/18  9:37 AM  Result Value Ref Range   Sodium 135 135 - 145 mEq/L   Potassium 4.7 3.5 - 5.1 mEq/L   Chloride 98 96 - 112 mEq/L   CO2 30 19 - 32 mEq/L   Glucose, Bld 218 (H) 70 - 99 mg/dL   BUN 23 6 - 23 mg/dL   Creatinine, Ser 0.79 0.40 - 1.50 mg/dL   Calcium 9.8 8.4 - 10.5 mg/dL   GFR 100.57 >60.00 mL/min   Assessment/Plan: Visit for preventive health examination Depression screen negative. Health Maintenance reviewed. Preventive schedule discussed and handout given in AVS. Will obtain fasting labs today.   Hyperlipidemia Taking statins as directed. Labs up-to-date. Medications refilled today.  Atypical chest pain Negative cardiac assessment per patient. Exam unremarkable. CXR today.  Dysphagia Noted on assessment by student. Notes occasional choking/coughing with swallowing. Higher risk for aspiration and aspiration pneumonia. Order for swallow study placed.  PSVT (paroxysmal supraventricular tachycardia) ([place]) Continue management per Cardiology.  Essential hypertension BP normotensive today. Continue current regimen. Repeat labs.     Leeanne Rio, PA-C

## 2018-03-10 NOTE — Assessment & Plan Note (Signed)
Taking statins as directed. Labs up-to-date. Medications refilled today.

## 2018-03-10 NOTE — Assessment & Plan Note (Signed)
Negative cardiac assessment per patient. Exam unremarkable. CXR today.

## 2018-03-10 NOTE — Assessment & Plan Note (Signed)
Continue management per Cardiology.

## 2018-03-10 NOTE — Assessment & Plan Note (Signed)
Depression screen negative. Health Maintenance reviewed. Preventive schedule discussed and handout given in AVS. Will obtain fasting labs today.  

## 2018-03-13 ENCOUNTER — Ambulatory Visit (INDEPENDENT_AMBULATORY_CARE_PROVIDER_SITE_OTHER): Payer: Medicare Other

## 2018-03-13 DIAGNOSIS — R0789 Other chest pain: Secondary | ICD-10-CM

## 2018-03-14 ENCOUNTER — Other Ambulatory Visit (HOSPITAL_COMMUNITY): Payer: Self-pay

## 2018-03-14 DIAGNOSIS — R1310 Dysphagia, unspecified: Secondary | ICD-10-CM

## 2018-03-21 ENCOUNTER — Telehealth: Payer: Self-pay | Admitting: Emergency Medicine

## 2018-03-21 DIAGNOSIS — S86811A Strain of other muscle(s) and tendon(s) at lower leg level, right leg, initial encounter: Secondary | ICD-10-CM

## 2018-03-21 DIAGNOSIS — M79604 Pain in right leg: Secondary | ICD-10-CM

## 2018-03-21 NOTE — Telephone Encounter (Signed)
Glucose much improved. Fine with him leaving at 22 for now.  Ok to place referral to Sports Medicine.

## 2018-03-21 NOTE — Telephone Encounter (Signed)
Spoke with patient and he advised that his blood sugar readings are 130, 135, 140, and 120 this morning. He is doing 22 Units.  Patient states the Prednisone is out of his system.   Patient also asked about if needing a referral for his strain muscle. He is using ice to the area, Tylenol every 6 hours. Still using his crutches. He is not taking the Hydrocodone.  Please advise

## 2018-03-22 NOTE — Telephone Encounter (Signed)
Called patient but phone line was busy. Will try again.

## 2018-03-22 NOTE — Addendum Note (Signed)
Addended by: Leonidas Romberg on: 03/22/2018 10:17 AM   Modules accepted: Orders

## 2018-03-23 NOTE — Telephone Encounter (Signed)
Spoke with patient and he states that he has appointment with his wife Ortho for evaluation of leg.  He will continue the dose of 22 units of insulin daily.

## 2018-03-24 ENCOUNTER — Ambulatory Visit: Payer: Medicare Other | Admitting: Pulmonary Disease

## 2018-03-24 ENCOUNTER — Other Ambulatory Visit: Payer: Self-pay | Admitting: Physician Assistant

## 2018-03-24 DIAGNOSIS — K219 Gastro-esophageal reflux disease without esophagitis: Secondary | ICD-10-CM

## 2018-03-27 ENCOUNTER — Encounter (HOSPITAL_COMMUNITY): Payer: Self-pay

## 2018-03-27 ENCOUNTER — Emergency Department (HOSPITAL_COMMUNITY)
Admission: EM | Admit: 2018-03-27 | Discharge: 2018-03-27 | Disposition: A | Discharge status: Home / Self Care | Payer: Medicare Other | Attending: Emergency Medicine | Admitting: Emergency Medicine

## 2018-03-27 ENCOUNTER — Other Ambulatory Visit: Payer: Self-pay

## 2018-03-27 ENCOUNTER — Emergency Department (HOSPITAL_COMMUNITY): Payer: Medicare Other

## 2018-03-27 DIAGNOSIS — Y999 Unspecified external cause status: Secondary | ICD-10-CM | POA: Diagnosis not present

## 2018-03-27 DIAGNOSIS — Z794 Long term (current) use of insulin: Secondary | ICD-10-CM | POA: Insufficient documentation

## 2018-03-27 DIAGNOSIS — E119 Type 2 diabetes mellitus without complications: Secondary | ICD-10-CM | POA: Diagnosis not present

## 2018-03-27 DIAGNOSIS — Y9289 Other specified places as the place of occurrence of the external cause: Secondary | ICD-10-CM | POA: Diagnosis not present

## 2018-03-27 DIAGNOSIS — Y9389 Activity, other specified: Secondary | ICD-10-CM | POA: Diagnosis not present

## 2018-03-27 DIAGNOSIS — M79604 Pain in right leg: Secondary | ICD-10-CM

## 2018-03-27 DIAGNOSIS — I2510 Atherosclerotic heart disease of native coronary artery without angina pectoris: Secondary | ICD-10-CM | POA: Diagnosis not present

## 2018-03-27 DIAGNOSIS — M79661 Pain in right lower leg: Secondary | ICD-10-CM | POA: Diagnosis not present

## 2018-03-27 DIAGNOSIS — I10 Essential (primary) hypertension: Secondary | ICD-10-CM | POA: Insufficient documentation

## 2018-03-27 DIAGNOSIS — Z87891 Personal history of nicotine dependence: Secondary | ICD-10-CM | POA: Diagnosis not present

## 2018-03-27 DIAGNOSIS — S86911A Strain of unspecified muscle(s) and tendon(s) at lower leg level, right leg, initial encounter: Secondary | ICD-10-CM | POA: Insufficient documentation

## 2018-03-27 DIAGNOSIS — X500XXA Overexertion from strenuous movement or load, initial encounter: Secondary | ICD-10-CM | POA: Insufficient documentation

## 2018-03-27 DIAGNOSIS — Z79899 Other long term (current) drug therapy: Secondary | ICD-10-CM | POA: Diagnosis not present

## 2018-03-27 DIAGNOSIS — S8991XA Unspecified injury of right lower leg, initial encounter: Secondary | ICD-10-CM | POA: Diagnosis present

## 2018-03-27 DIAGNOSIS — T148XXA Other injury of unspecified body region, initial encounter: Secondary | ICD-10-CM

## 2018-03-27 MED ORDER — HYDROCODONE-ACETAMINOPHEN 5-325 MG PO TABS: 2.0000 | ORAL_TABLET | ORAL | 0 refills | Status: AC | PRN

## 2018-03-27 NOTE — Discharge Instructions (Addendum)
Your ultrasound today was negative for any clots. I have prescribed medication to help with your pain.  Please follow-up with orthopedist tomorrow or schedule appointment.  You may also take 400 mg ibuprofen every 6 hours as needed for your pain.

## 2018-03-27 NOTE — ED Provider Notes (Signed)
[facility] EMERGENCY DEPARTMENT Provider Note   CSN: [id number] Arrival date & time: 03/27/18  1053     History   Chief Complaint Chief Complaint  Patient presents with  . Leg Pain    HPI Marcus Norton is a 78 y.o. male.  78 y.o male with a PMH of CAD,HTN, HLD presents to the ED with a chief complaint of right leg pain x 3 days. Patient reports he was stepping over a guardrail on 03/16/18 and felt like a bolt of lighten on his right calf.  Was seen by urgent care and diagnosed with a muscle strain, he was given hydrocodone to control his pain.  Is worse with ambulation and palpation.  No alleviating symptoms.  Patient reports he tried scheduling an appointment with orthopedist but was unable to do so until tomorrow.  He reports pain worsening to the right calf along with tightening.  Reports on tolerable pain.  Is currently not on any blood thinners, he denies any previous history of blood clot, shortness of breath or chest pain.     Past Medical History:  Diagnosis Date  . 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 Arthritis    "lower back" (07/29/2016)  . ASCVD (arteriosclerotic cardiovascular disease)    -luminal irregularities in 1998 and 2005; normal EF  . Benign prostatic hypertrophy    status post transurethral resection of the prostate  . CAD (coronary artery disease)    a. Cath 07/29/16 99% ostial RCA & 70% pRCA s/p cutting balloon angioplasty and single SYNERGY DES 2.5X28. 100% very Distal 1st RPLB lesion --> the occlusion site moved further distal with guidewire advancement;  40% ostial LAD; 50% Ost Cx to Prox Cx lesion.  . Coronary artery disease   . Dysphagia   . Erectile dysfunction   . GERD (gastroesophageal reflux disease)   . Heart murmur   . History of hiatal hernia   . History of kidney stones   . Hyperlipidemia   . Hypertension   . Seasonal allergies   . Tobacco abuse    20 pack years; discontinued 1995  . Type II diabetes mellitus [facility])     Patient  Active Problem List   Diagnosis Date Noted  . PSVT (paroxysmal supraventricular tachycardia) ([place]) 03/10/2018  . Visit for preventive health examination 03/10/2018  . Atypical chest pain 03/10/2018  . Dysphagia 03/10/2018  . Severe sepsis ([place]) 02/04/2018  . Hyponatremia 02/04/2018  . Chronic cough 12/20/2017  . CAD S/P percutaneous coronary angioplasty 07/20/2017  . Exertional dyspnea 07/15/2017  . GERD (gastroesophageal reflux disease) 04/14/2017  . Hematuria 03/08/2017  . Encounter for Medicare annual wellness exam 03/08/2017  . Need for pneumococcal vaccination 03/08/2017  . Status post coronary artery stent placement   . Atypical angina ([place]) 07/29/2016  . Abnormal nuclear stress test 07/29/2016  . Angina pectoris ([place])   . Dyspepsia 07/12/2016  . Constipation 05/12/2016  . History of adenomatous polyp of colon 05/12/2016  . Generalized anxiety disorder 03/04/2016  . PAD (peripheral artery disease) ([place]) 02/26/2014  . Arteriosclerotic cardiovascular disease -nonobstructive   . Insulin dependent diabetes mellitus with complications ([place])   . Essential hypertension   . History of tobacco abuse   . Hyperlipidemia 07/11/2009  . 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 Abnormal liver function tests 07/11/2009    Past Surgical History:  Procedure Laterality Date  . APPENDECTOMY    . CATARACT EXTRACTION W/ INTRAOCULAR LENS  IMPLANT, BILATERAL Bilateral   . COLONOSCOPY  07/20/2011  Dr. Gala Romney: multiple hyperplastic polyps. Surveillance 2018  . COLONOSCOPY N/A 06/30/2016   Procedure: COLONOSCOPY;  Surgeon: Daneil Dolin, MD;  Location: [facility] ENDO SUITE;  Service: Endoscopy;  Laterality: N/A;  10:00am  . COLONOSCOPY W/ BIOPSIES AND POLYPECTOMY  07/08/08, 06/2011   Dr Madolyn Frieze papilla, diminutive rectal polyp ablated the, left-sided diverticula, piecemeal polypectomy of hepatic flexure adenomatous polyp, diminutive polyps near hepatic flexure ablated  . CORONARY ANGIOPLASTY WITH  STENT PLACEMENT  07/29/2016  . CORONARY STENT INTERVENTION N/A 07/29/2016   Procedure: Coronary Stent Intervention;  Surgeon: Leonie Man, MD;  Location: [place] CV LAB;  Service: Cardiovascular;  Laterality: N/A;  RCA  . ESOPHAGOGASTRODUODENOSCOPY  2013   erosive reflux esophagitis, s/p empiric dilation. chronic duodenitis.   [name] LEFT HEART CATH AND CORONARY ANGIOGRAPHY N/A 07/29/2016   Procedure: Left Heart Cath and Coronary Angiography;  Surgeon: Leonie Man, MD;  Location: [place] CV LAB;  Service: Cardiovascular;  Laterality: N/A;  . NASAL SEPTUM SURGERY    . NASAL SINUS SURGERY     "cleaned out my sinus"  . PERIPHERAL VASCULAR CATHETERIZATION N/A 11/26/2015   Procedure: Abdominal Aortogram w/Lower Extremity;  Surgeon: Wellington Hampshire, MD;  Location: [place] CV LAB;  Service: Cardiovascular;  Laterality: N/A;  . TONSILLECTOMY    . TRANSURETHRAL RESECTION OF PROSTATE  2009        Home Medications    Prior to Admission medications   Medication Sig Start Date End Date Taking? Authorizing Provider  ACCU-CHEK AVIVA PLUS test strip USE TO TEST BLOOD SUAGR TWICE DAILY 11/30/17   [name], PA-C  acetaminophen (TYLENOL) 325 MG tablet Take 325 mg by mouth 2 (two) times daily as needed for mild pain.    [provider]  albuterol (PROVENTIL HFA;VENTOLIN HFA) 108 (90 Base) MCG/ACT inhaler Inhale 2 puffs into the lungs every 6 (six) hours as needed for wheezing or shortness of breath. 02/06/18   Hongalgi, Lenis Dickinson, MD  alfuzosin (UROXATRAL) 10 MG 24 hr tablet Take 10 mg by mouth daily with breakfast.    [provider]  amLODipine (NORVASC) 5 MG tablet Take 1 tablet (5 mg total) by mouth daily. 12/20/17 03/20/18  [name], PA-C  aspirin EC 81 MG tablet Take 81 mg by mouth at bedtime.     [provider]  atorvastatin (LIPITOR) 10 MG tablet Take 1 tablet (10 mg total) by mouth at bedtime. 02/06/18   Hongalgi, Lenis Dickinson, MD  azelastine  (ASTELIN) 0.1 % nasal spray Place 2 sprays into both nostrils 2 (two) times daily. Use in each nostril as directed 12/20/17   [name], PA-C  docusate sodium (COLACE) 100 MG capsule Take 100 mg by mouth as needed for mild constipation.     [provider]  glucose blood test strip Accu-Chek Aviva Plus test strips    [provider]  HYDROcodone-acetaminophen (NORCO/VICODIN) 5-325 MG tablet Take 2 tablets by mouth every 4 (four) hours as needed for up to 3 days. 03/27/18 03/30/18  Janeece Fitting, PA-C  Insulin Pen Needle (B-D ULTRAFINE III SHORT PEN) 31G X 8 MM MISC Inject insulin SQ daily Dx:E11.8 11/22/17   [name], PA-C  JANUVIA 100 MG tablet TAKE 1 TABLET(100 MG) BY MOUTH DAILY Patient taking differently: Take 100 mg by mouth daily.  12/27/17   [name], PA-C  LANTUS SOLOSTAR 100 UNIT/ML Solostar Pen Inject 20 Units into the skin daily at 10 pm. Increase by 2  units every 3 days Patient taking differently: Inject 24 Units into the skin daily at 10 pm. Increase by 2 units every 3 days 02/06/18   Modena Jansky, MD  losartan (COZAAR) 25 MG tablet TAKE 1 TABLET(25 MG) BY MOUTH DAILY 02/13/18   [name], PA-C  Magnesium 200 MG TABS Take 1 tablet (200 mg total) by mouth daily. 08/30/16   Lendon Colonel, NP  metFORMIN (GLUCOPHAGE) 1000 MG tablet TAKE 1 TABLET(1000 MG) BY MOUTH TWICE DAILY WITH A MEAL Patient taking differently: Take 1,000 mg by mouth 2 (two) times daily with a meal.  11/09/17   Leamon Arnt, MD  metoprolol tartrate (LOPRESSOR) 25 MG tablet TAKE 1 TABLET(25 MG) BY MOUTH TWICE DAILY Patient taking differently: Take 25 mg by mouth 2 (two) times daily.  11/18/17   Herminio Commons, MD  pantoprazole (PROTONIX) 40 MG tablet TAKE 1 TABLET(40 MG) BY MOUTH DAILY 03/24/18   [name], PA-C  tadalafil (CIALIS) 20 MG tablet Take 0.5-1 tablets (10-20 mg total) by mouth every other day as needed for erectile dysfunction. 03/08/17    [name], PA-C    Family History Family History  Problem Relation Age of Onset  . Ovarian cancer Mother   . Colon cancer Sister 8       deceased from colon cancer    Social History Social History   Tobacco Use  . Smoking status: Former Smoker    Packs/day: 1.00    Years: 15.00    Pack years: 15.00    Types: Cigarettes    Start date: 03/08/1965    Last attempt to quit: 07/07/1993    Years since quitting: 24.7  . Smokeless tobacco: Never Used  Substance Use Topics  . Alcohol use: No    Alcohol/week: 0.0 standard drinks    Frequency: Never    Comment: 07/29/2016 "last drink was 4 wks ago; had a beer"  . Drug use: No     Allergies   Plavix [clopidogrel bisulfate]; Oxycontin [oxycodone hcl]; Penicillins; Pioglitazone; Prednisone; and Simvastatin   Review of Systems Review of Systems  Respiratory: Negative for cough and shortness of breath.   Cardiovascular: Positive for leg swelling. Negative for chest pain.  Skin: Positive for color change.     Physical Exam Updated Vital Signs BP 126/82 (BP Location: Right Arm)   Pulse 75   Temp 97.7 F (36.5 C) (Oral)   Resp 16   Ht 5\' 7"  (1.702 m)   Wt 79.4 kg   SpO2 95%   BMI 27.41 kg/m   Physical Exam Vitals signs and nursing note reviewed.  Constitutional:      Appearance: He is well-developed.  HENT:     Head: Normocephalic and atraumatic.  Eyes:     General: No scleral icterus.    Pupils: Pupils are equal, round, and reactive to light.  Neck:     Musculoskeletal: Normal range of motion.  Cardiovascular:     Heart sounds: Normal heart sounds.  Pulmonary:     Effort: Pulmonary effort is normal.     Breath sounds: Normal breath sounds. No wheezing.  Chest:     Chest wall: No tenderness.  Abdominal:     General: Bowel sounds are normal. There is no distension.     Palpations: Abdomen is soft.     Tenderness: There is no abdominal tenderness.  Musculoskeletal:        General: No deformity.      Right lower  leg: He exhibits tenderness and swelling. He exhibits no deformity. No edema.       Legs:  Skin:    General: Skin is warm and dry.  Neurological:     Mental Status: He is alert and oriented to person, place, and time.      ED Treatments / Results  Labs (all labs ordered are listed, but only abnormal results are displayed) Labs Reviewed - No data to display  EKG None  Radiology US Venous Img Lower Unilateral Right  Result Date: 03/27/2018 CLINICAL DATA:  Right lower extremity pain and edema since 03/16/2018. Evaluate for DVT. EXAM: RIGHT LOWER EXTREMITY VENOUS DOPPLER ULTRASOUND TECHNIQUE: Gray-scale sonography with graded compression, as well as color Doppler and duplex ultrasound were performed to evaluate the lower extremity deep venous systems from the level of the common femoral vein and including the common femoral, femoral, profunda femoral, popliteal and calf veins including the posterior tibial, peroneal and gastrocnemius veins when visible. The superficial great saphenous vein was also interrogated. Spectral Doppler was utilized to evaluate flow at rest and with distal augmentation maneuvers in the common femoral, femoral and popliteal veins. COMPARISON:  None. FINDINGS: Contralateral Common Femoral Vein: Respiratory phasicity is normal and symmetric with the symptomatic side. No evidence of thrombus. Normal compressibility. Common Femoral Vein: No evidence of thrombus. Normal compressibility, respiratory phasicity and response to augmentation. Saphenofemoral Junction: No evidence of thrombus. Normal compressibility and flow on color Doppler imaging. Profunda Femoral Vein: No evidence of thrombus. Normal compressibility and flow on color Doppler imaging. Femoral Vein: No evidence of thrombus. Normal compressibility, respiratory phasicity and response to augmentation. Popliteal Vein: No evidence of thrombus. Normal compressibility, respiratory phasicity and response to  augmentation. Calf Veins: No evidence of thrombus. Normal compressibility and flow on color Doppler imaging. Superficial Great Saphenous Vein: No evidence of thrombus. Normal compressibility. Venous Reflux:  None. Other Findings: There is a minimal amount of subcutaneous edema at the level of the popliteal fossa and right lower leg. There is hypoechoic near occlusive thrombus within the right lesser saphenous vein (image 54 through 57). IMPRESSION: 1. No evidence of DVT within right lower extremity. 2. The examination is positive for near occlusive SVT involving the right lesser saphenous vein. Again, there is no extension of this near occlusive SVT to the deep venous system of the right lower extremity. Electronically Signed   By: Sandi Mariscal M.D.   On: 03/27/2018 14:53    Procedures Procedures (including critical care time)  Medications Ordered in ED Medications - No data to display   Initial Impression / Assessment and Plan / ED Course  I have reviewed the triage vital signs and the nursing notes.  Pertinent labs & imaging results that were available during my care of the patient were reviewed by me and considered in my medical decision making (see chart for details).     Patient with right calf swelling, posttraumatic while stepping on a guardrail on January 23, seen at urgent care, diagnosed with muscle strain.  Has orthopedic follow-up for tomorrow but reports pain has increased in nature.  Was sent home with hydrocodone prescription which he has now run out.  During evaluation there is significant tenderness to the right calf along with swelling, no pitting edema.  There is also bruising noted to the area on the inner thigh and inner right foot.  Some suspicion for venous thrombosis, he is currently not on any blood thinners has a previous cardiac history but denies any previous  history of blood clots.  Will obtain ultrasound to rule out DVT.   US DVT:  1. No evidence of DVT within right  lower extremity.  2. The examination is positive for near occlusive SVT involving the  right lesser saphenous vein. Again, there is no extension of this  near occlusive SVT to the deep venous system of the right lower  extremity.     Has orthopedist appointment tomorrow, will provide him with pain medication for his symptoms.  I have discussed this patient with Dr. Lajuan Lines who has also seen and evaluated patient, he is advised to follow-up with orthopedist no DVT during exam, suspicion for a gastroc strain.  Strict return precautions provided for patient.  Final Clinical Impressions(s) / ED Diagnoses   Final diagnoses:  Pain of right lower extremity  Muscle strain    ED Discharge Orders         Ordered    HYDROcodone-acetaminophen (NORCO/VICODIN) 5-325 MG tablet  Every 4 hours PRN     03/27/18 1515           Janeece Fitting, PA-C 03/27/18 1518    Virgel Manifold, MD 03/27/18 1554

## 2018-03-27 NOTE — ED Notes (Signed)
Pt is complaining of  Right calf that extends up the whole leg. Pt states he just needs pain medications to make it through to tomorrow.

## 2018-03-27 NOTE — ED Triage Notes (Signed)
Pt reports that he was stepping over a guardrail on 03/16/18 and felt like a bolt of lighten.  Pt reports that he was seen by  Urgent care and was dx with muscle strain. Pt reports he has an appt with ortho tomorrow. Pt reports he has run out of his pain medication

## 2018-03-30 ENCOUNTER — Ambulatory Visit (HOSPITAL_COMMUNITY): Payer: Medicare Other

## 2018-03-30 ENCOUNTER — Encounter (HOSPITAL_COMMUNITY): Payer: Medicare Other

## 2018-04-03 ENCOUNTER — Ambulatory Visit: Payer: Self-pay

## 2018-04-03 NOTE — Telephone Encounter (Signed)
Pt calling requesting for enough pain medicine to get to his orthopedic appointment on Thursday. Pt was seen at [facility] 03/13/18 after injuring his right calf from stepping over a guardrail and was diagnosed as muscle strain given hydrocodone to control pain.  Pt stated went to [facility] ED for reevaluation on 03/16/18 and was given 2 days worth of Vicodin.  Pt stated that his right lower leg and foot are swollen and he is having stabbing pain. After he ran out of pain medication, he has been taking aleve but stated it is not helping.  Called PCP office and spoke with [facility] regarding if pt needs an appointment. Romelle Starcher stated to send triage note and she will show it to his provider for review. Pt updated and verbalized understanding. Reason for Disposition . [1] SEVERE pain (e.g., excruciating, unable to do any normal activities) AND [2] not improved after 2 hours of pain medicine  Answer Assessment - Initial Assessment Questions 1. ONSET: "When did the pain start?"      Day of injury were given 2 days worth of Vicodin  2. LOCATION: "Where is the pain located?"      Right calf and stabbing pain- feels pain to the toes 3. PAIN: "How bad is the pain?"    (Scale 1-10; or mild, moderate, severe)   -  MILD (1-3): doesn't interfere with normal activities    -  MODERATE (4-7): interferes with normal activities (e.g., work or school) or awakens from sleep, limping    -  SEVERE (8-10): excruciating pain, unable to do any normal activities, unable to walk     severe 4. WORK OR EXERCISE: "Has there been any recent work or exercise that involved this part of the body?"      Injury  5. CAUSE: "What do you think is causing the leg pain?"     Achilles tendon torn 6. OTHER SYMPTOMS: "Do you have any other symptoms?" (e.g., chest pain, back pain, breathing difficulty, swelling, rash, fever, numbness, weakness)     Lower leg and foot swelling 7. PREGNANCY: "Is there any chance you are pregnant?" "When was your last  menstrual period?"     n/a  Protocols used: LEG PAIN-A-AH

## 2018-04-03 NOTE — Telephone Encounter (Signed)
We do not given opioid medications without assessment. Can he come see me in the morning for re-assessment? Otherwise he will need to contact the providers who prescribed previously.

## 2018-04-03 NOTE — Telephone Encounter (Signed)
Spoke with patient.  He is aware that we are unable to call something in without patient being seen.  He stated verbal understanding.   I have scheduled patient for 11:00 tomorrow and if he is not able to come in he will call early to cancel.

## 2018-04-04 ENCOUNTER — Ambulatory Visit: Payer: Medicare Other | Admitting: Pulmonary Disease

## 2018-04-04 ENCOUNTER — Ambulatory Visit: Payer: Medicare Other | Admitting: Physician Assistant

## 2018-04-04 ENCOUNTER — Other Ambulatory Visit: Payer: Self-pay

## 2018-04-04 ENCOUNTER — Encounter: Payer: Self-pay | Admitting: Physician Assistant

## 2018-04-04 VITALS — BP 112/50 | HR 76 | Temp 97.5°F | Resp 14 | Ht 67.0 in | Wt 178.0 lb

## 2018-04-04 DIAGNOSIS — I82811 Embolism and thrombosis of superficial veins of right lower extremities: Secondary | ICD-10-CM

## 2018-04-04 DIAGNOSIS — S86111D Strain of other muscle(s) and tendon(s) of posterior muscle group at lower leg level, right leg, subsequent encounter: Secondary | ICD-10-CM

## 2018-04-04 MED ORDER — HYDROCODONE-ACETAMINOPHEN 5-325 MG PO TABS: 1.0000 | ORAL_TABLET | Freq: Four times a day (QID) | ORAL | 0 refills | Status: DC | PRN

## 2018-04-04 NOTE — Patient Instructions (Signed)
Please elevate the leg while resting. Apply an ACE wrap starting and toes and going up the extremity while at home to help with swelling and pain. Continue Aspirin twice daily as directed by specialist.  Do not take Aleve with this. Take tylenol instead for mild to moderate pain. I am giving you a couple of doses of Hydrocodone to take as directed, if needed, for more severe pain.   Follow-up with your specialist Thursday as scheduled. ER for any acute worsening of symptoms.

## 2018-04-04 NOTE — Progress Notes (Signed)
Patient presents to clinic today c/o significant residual pain of R leg. Patient was initially seen at [facility] on 03/16/18. Was diagnosed with calf sprain and given Hydrocodone to take for severe pain. Was taking with only some improvement. As such, went to ER on 03/27/2018 where [place] was obtained revealing superficial venous thrombosis in the right lesser saphenous vein without extension near the deep venous system. Has seen Orthopedics who recommended Tylenol, RICE. Scheduled for close follow-up on Thursday for next steps. Concern for tear. Patient endorses he has been taking 2 aleve 8 hours along with his Aspirin 325 mg BID. Notes this does provider some relief. Is keeping leg elevated and using heating pad. Is wanting to discuss options for pain until he sees specialist in 2 days. Tried to contact them but was unable to get call back.   Past Medical History:  Diagnosis Date  . 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 Arthritis    "lower back" (07/29/2016)  . ASCVD (arteriosclerotic cardiovascular disease)    -luminal irregularities in 1998 and 2005; normal EF  . Benign prostatic hypertrophy    status post transurethral resection of the prostate  . CAD (coronary artery disease)    a. Cath 07/29/16 99% ostial RCA & 70% pRCA s/p cutting balloon angioplasty and single SYNERGY DES 2.5X28. 100% very Distal 1st RPLB lesion --> the occlusion site moved further distal with guidewire advancement;  40% ostial LAD; 50% Ost Cx to Prox Cx lesion.  . Coronary artery disease   . Dysphagia   . Erectile dysfunction   . GERD (gastroesophageal reflux disease)   . Heart murmur   . History of hiatal hernia   . History of kidney stones   . Hyperlipidemia   . Hypertension   . Seasonal allergies   . Tobacco abuse    20 pack years; discontinued 1995  . Type II diabetes mellitus ([place])     Current Outpatient Medications on File Prior to Visit  Medication Sig Dispense Refill  . ACCU-CHEK AVIVA PLUS test strip USE TO TEST  BLOOD SUAGR TWICE DAILY 200 each 2  . acetaminophen (TYLENOL) 325 MG tablet Take 325 mg by mouth 2 (two) times daily as needed for mild pain.    [name] albuterol (PROVENTIL HFA;VENTOLIN HFA) 108 (90 Base) MCG/ACT inhaler Inhale 2 puffs into the lungs every 6 (six) hours as needed for wheezing or shortness of breath. 1 Inhaler 0  . alfuzosin (UROXATRAL) 10 MG 24 hr tablet Take 10 mg by mouth daily with breakfast.    . amLODipine (NORVASC) 5 MG tablet Take 1 tablet (5 mg total) by mouth daily. 90 tablet 1  . aspirin EC 81 MG tablet Take 81 mg by mouth at bedtime.     [name] atorvastatin (LIPITOR) 10 MG tablet Take 1 tablet (10 mg total) by mouth at bedtime.    [name] azelastine (ASTELIN) 0.1 % nasal spray Place 2 sprays into both nostrils 2 (two) times daily. Use in each nostril as directed 30 mL 12  . docusate sodium (COLACE) 100 MG capsule Take 100 mg by mouth as needed for mild constipation.     [name] glucose blood test strip Accu-Chek Aviva Plus test strips    . Insulin Pen Needle (B-D ULTRAFINE III SHORT PEN) 31G X 8 MM MISC Inject insulin SQ daily Dx:E11.8 100 each 3  . JANUVIA 100 MG tablet TAKE 1 TABLET(100 MG) BY MOUTH DAILY (Patient taking differently: Take 100 mg by mouth daily. ) 90 tablet 1  .  LANTUS SOLOSTAR 100 UNIT/ML Solostar Pen Inject 20 Units into the skin daily at 10 pm. Increase by 2 units every 3 days (Patient taking differently: Inject 24 Units into the skin daily at 10 pm. Increase by 2 units every 3 days)    . losartan (COZAAR) 25 MG tablet TAKE 1 TABLET(25 MG) BY MOUTH DAILY 90 tablet 1  . Magnesium 200 MG TABS Take 1 tablet (200 mg total) by mouth daily. 30 each 6  . metFORMIN (GLUCOPHAGE) 1000 MG tablet TAKE 1 TABLET(1000 MG) BY MOUTH TWICE DAILY WITH A MEAL (Patient taking differently: Take 1,000 mg by mouth 2 (two) times daily with a meal. ) 180 tablet 0  . metoprolol tartrate (LOPRESSOR) 25 MG tablet TAKE 1 TABLET(25 MG) BY MOUTH TWICE DAILY (Patient taking differently: Take 25 mg by mouth  2 (two) times daily. ) 180 tablet 1  . pantoprazole (PROTONIX) 40 MG tablet TAKE 1 TABLET(40 MG) BY MOUTH DAILY 30 tablet 3  . tadalafil (CIALIS) 20 MG tablet Take 0.5-1 tablets (10-20 mg total) by mouth every other day as needed for erectile dysfunction. 5 tablet 1   No current facility-administered medications on file prior to visit.     Allergies  Allergen Reactions  . Plavix [Clopidogrel Bisulfate] Shortness Of Breath  . Oxycontin [Oxycodone Hcl] Other (See Comments)    Malaise  . Penicillins Other (See Comments)    [place] Has patient had a PCN reaction causing immediate rash, facial/tongue/throat swelling, SOB or lightheadedness with hypotension: No Has patient had a PCN reaction causing severe rash involving mucus membranes or skin necrosis: No Has patient had a PCN reaction that required hospitalization No Has patient had a PCN reaction occurring within the last 10 years: No If all of the above answers are "NO", then may proceed with Cephalosporin use.   . Pioglitazone Other (See Comments)    Made his chest hurt  . Prednisone Other (See Comments)    Reaction: muscle aches and soreness  . Simvastatin Other (See Comments)    Myalgias    Family History  Problem Relation Age of Onset  . Ovarian cancer Mother   . Colon cancer Sister 6       deceased from colon cancer    Social History   Socioeconomic History  . Marital status: Married    Spouse name: Not on file  . Number of children: 2  . Years of education: Not on file  . Highest education level: Not on file  Occupational History  . Occupation: Dietitian, 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    Comment: Gaffer of agriculture  Social Needs  . Financial resource strain: Not on file  . Food insecurity:    Worry: Not on file    Inability: Not on file  . Transportation needs:    Medical: Not on file    Non-medical: Not on file  Tobacco Use  . Smoking status: Former Smoker    Packs/day: 1.00    Years: 15.00     Pack years: 15.00    Types: Cigarettes    Start date: 03/08/1965    Last attempt to quit: 07/07/1993    Years since quitting: 24.7  . Smokeless tobacco: Never Used  Substance and Sexual Activity  . Alcohol use: No    Alcohol/week: 0.0 standard drinks    Frequency: Never    Comment: 07/29/2016 "last drink was 4 wks ago; had a beer"  . Drug use: No  . Sexual activity: Not Currently  Lifestyle  .  Physical activity:    Days per week: Not on file    Minutes per session: Not on file  . Stress: Not on file  Relationships  . Social connections:    Talks on phone: Not on file    Gets together: Not on file    Attends religious service: Not on file    Active member of club or organization: Not on file    Attends meetings of clubs or organizations: Not on file    Relationship status: Not on file  Other Topics Concern  . Not on file  Social History Narrative  . Not on file    Review of Systems - See HPI.  All other ROS are negative.  There were no vitals taken for this visit.  Physical Exam Musculoskeletal:       Legs:     Recent Results (from the past 2160 hour(s))  CBC with Differential/Platelet     Status: Abnormal   Collection Time: 02/04/18 12:40 PM  Result Value Ref Range   WBC 11.8 (H) 4.0 - 10.5 K/uL   RBC 4.07 (L) 4.22 - 5.81 MIL/uL   Hemoglobin 13.5 13.0 - 17.0 g/dL   HCT 41.6 39.0 - 52.0 %   MCV 102.2 (H) 80.0 - 100.0 fL   MCH 33.2 26.0 - 34.0 pg   MCHC 32.5 30.0 - 36.0 g/dL   RDW 12.6 11.5 - 15.5 %   Platelets 250 150 - 400 K/uL   nRBC 0.0 0.0 - 0.2 %   Neutrophils Relative % 78 %   Neutro Abs 9.1 (H) 1.7 - 7.7 K/uL   Lymphocytes Relative 10 %   Lymphs Abs 1.1 0.7 - 4.0 K/uL   Monocytes Relative 12 %   Monocytes Absolute 1.5 (H) 0.1 - 1.0 K/uL   Eosinophils Relative 0 %   Eosinophils Absolute 0.0 0.0 - 0.5 K/uL   Basophils Relative 0 %   Basophils Absolute 0.1 0.0 - 0.1 K/uL   WBC Morphology INCREASED BANDS (>20% BANDS)    Immature Granulocytes 0 %     Abs Immature Granulocytes 0.03 0.00 - 0.07 K/uL    Comment: Performed at [facility], [place] [address], [place], [place] [address]  Comprehensive metabolic panel     Status: Abnormal   Collection Time: 02/04/18 12:40 PM  Result Value Ref Range   Sodium 130 (L) 135 - 145 mmol/L   Potassium 4.5 3.5 - 5.1 mmol/L   Chloride 93 (L) 98 - 111 mmol/L   CO2 24 22 - 32 mmol/L   Glucose, Bld 196 (H) 70 - 99 mg/dL   BUN 28 (H) 8 - 23 mg/dL   Creatinine, Ser 0.74 0.61 - 1.24 mg/dL   Calcium 9.2 8.9 - 10.3 mg/dL   Total Protein 7.4 6.5 - 8.1 g/dL   Albumin 4.1 3.5 - 5.0 g/dL   AST 19 15 - 41 U/L   ALT 20 0 - 44 U/L   Alkaline Phosphatase 49 38 - 126 U/L   Total Bilirubin 1.4 (H) 0.3 - 1.2 mg/dL   GFR calc non Af Amer >60 >60 mL/min   GFR calc Af Amer >60 >60 mL/min   Anion gap 13 5 - 15    Comment: Performed at [facility], [place] [address], [place], [place] [address]  Blood culture (routine x 2)     Status: None   Collection Time: 02/04/18 12:40 PM  Result Value Ref Range   Specimen Description  BLOOD LEFT FOREARM Performed at [place] [address] [place], [place] [address]    Special Requests      BOTTLES DRAWN AEROBIC AND ANAEROBIC Blood Culture adequate volume Performed at [place] [address] [place], [place] [address]    Culture      NO GROWTH 5 DAYS Performed at [facility], [place] [address], [place], [place] [address]    Report Status 02/09/2018 FINAL   Blood culture (routine x 2)     Status: None   Collection Time: 02/04/18 12:40 PM  Result Value Ref Range   Specimen Description      BLOOD RIGHT FOREARM Performed at [place] [address], [place], [place] [address]    Special Requests      BOTTLES DRAWN AEROBIC AND ANAEROBIC Blood Culture adequate volume Performed at [place] [address] [place], [place] [address]     Culture      NO GROWTH 5 DAYS Performed at [facility], [place] [address], [place], [place] [address]    Report Status 02/09/2018 FINAL   I-stat troponin, ED     Status: None   Collection Time: 02/04/18 12:49 PM  Result Value Ref Range   Troponin i, poc 0.02 0.00 - 0.08 ng/mL   Comment 3            Comment: Due to the release kinetics of cTnI, a negative result within the first hours of the onset of symptoms does not rule out myocardial infarction with certainty. If myocardial infarction is still suspected, repeat the test at appropriate intervals.   I-Stat CG4 Lactic Acid, ED     Status: Abnormal   Collection Time: 02/04/18 12:51 PM  Result Value Ref Range   Lactic Acid, Venous 2.81 (HH) 0.5 - 1.9 mmol/L   Comment NOTIFIED PHYSICIAN   Influenza panel by PCR (type A & B)     Status: None   Collection Time: 02/04/18  1:45 PM  Result Value Ref Range   Influenza A By PCR NEGATIVE NEGATIVE   Influenza B By PCR NEGATIVE NEGATIVE    Comment: (NOTE) The Xpert Xpress Flu assay is intended as an aid in the diagnosis of  influenza and should not be used as a sole basis for treatment.  This  assay is FDA approved for nasopharyngeal swab specimens only. Nasal  washings and aspirates are unacceptable for Xpert Xpress Flu testing. Performed at [facility], [place] [address] [place], [place] [address]   I-Stat CG4 Lactic Acid, ED     Status: Abnormal   Collection Time: 02/04/18  2:28 PM  Result Value Ref Range   Lactic Acid, Venous 3.68 (HH) 0.5 - 1.9 mmol/L   Comment NOTIFIED PHYSICIAN   Urinalysis, Routine w reflex microscopic     Status: Abnormal   Collection Time: 02/04/18  2:47 PM  Result Value Ref Range   Color, Urine YELLOW YELLOW   APPearance CLEAR CLEAR   Specific Gravity, Urine 1.016 1.005 - 1.030   pH 5.0 5.0 - 8.0   Glucose, UA >=500 (A) NEGATIVE mg/dL   Hgb urine dipstick NEGATIVE NEGATIVE   Bilirubin Urine NEGATIVE NEGATIVE   Ketones, ur 5 (A)  NEGATIVE mg/dL   Protein, ur NEGATIVE NEGATIVE mg/dL   Nitrite NEGATIVE NEGATIVE   Leukocytes, UA NEGATIVE NEGATIVE   RBC / HPF 0-5 0 - 5 RBC/hpf   WBC, UA 0-5 0 -  5 WBC/hpf   Bacteria, UA NONE SEEN NONE SEEN   Squamous Epithelial / LPF 0-5 0 - 5   Mucus PRESENT    Hyaline Casts, UA PRESENT     Comment: Performed at [facility], [place] [address], [place], [place] [address]  Lactic acid, plasma     Status: Abnormal   Collection Time: 02/04/18  4:43 PM  Result Value Ref Range   Lactic Acid, Venous 3.1 (HH) 0.5 - 1.9 mmol/L    Comment: CRITICAL RESULT CALLED TO, READ BACK BY AND VERIFIED WITH: DUNKELBERGER,K AT 1723 ON 02/04/2018 BY MOSLEY,J Performed at [facility], [place] [address], [place], [place] [address]   Procalcitonin     Status: None   Collection Time: 02/04/18  4:43 PM  Result Value Ref Range   Procalcitonin <0.10 ng/mL    Comment:        Interpretation: PCT (Procalcitonin) <= 0.5 ng/mL: Systemic infection (sepsis) is not likely. Local bacterial infection is possible. (NOTE)       Sepsis PCT Algorithm           Lower Respiratory Tract                                      Infection PCT Algorithm    ----------------------------     ----------------------------         PCT < 0.25 ng/mL                PCT < 0.10 ng/mL         Strongly encourage             Strongly discourage   discontinuation of antibiotics    initiation of antibiotics    ----------------------------     -----------------------------       PCT 0.25 - 0.50 ng/mL            PCT 0.10 - 0.25 ng/mL               OR       >80% decrease in PCT            Discourage initiation of                                            antibiotics      Encourage discontinuation           of antibiotics    ----------------------------     -----------------------------         PCT >= 0.50 ng/mL              PCT 0.26 - 0.50 ng/mL               AND        <80% decrease in PCT              Encourage initiation of                                             antibiotics       Encourage continuation           of antibiotics    ----------------------------     -----------------------------  PCT >= 0.50 ng/mL                  PCT > 0.50 ng/mL               AND         increase in PCT                  Strongly encourage                                      initiation of antibiotics    Strongly encourage escalation           of antibiotics                                     -----------------------------                                           PCT <= 0.25 ng/mL                                                 OR                                        > 80% decrease in PCT                                     Discontinue / Do not initiate                                             antibiotics Performed at [place] [address], [place], [place] [address]   CBC     Status: Abnormal   Collection Time: 02/04/18  4:43 PM  Result Value Ref Range   WBC 10.1 4.0 - 10.5 K/uL   RBC 3.71 (L) 4.22 - 5.81 MIL/uL   Hemoglobin 12.5 (L) 13.0 - 17.0 g/dL   HCT 38.3 (L) 39.0 - 52.0 %   MCV 103.2 (H) 80.0 - 100.0 fL   MCH 33.7 26.0 - 34.0 pg   MCHC 32.6 30.0 - 36.0 g/dL   RDW 12.6 11.5 - 15.5 %   Platelets 221 150 - 400 K/uL   nRBC 0.0 0.0 - 0.2 %    Comment: Performed at [facility], [place] [address] [place],  [address]  Creatinine, serum     Status: None   Collection Time: 02/04/18  4:43 PM  Result Value Ref Range   Creatinine, Ser 0.77 0.61 - 1.24 mg/dL   GFR calc non Af Amer >60 >60 mL/min   GFR calc Af Amer >60 >60 mL/min    Comment: Performed at [facility], [place] [address] [place], [place] [address]  Glucose, capillary  Status: Abnormal   Collection Time: 02/04/18  4:44 PM  Result Value Ref Range   Glucose-Capillary 202 (H) 70 - 99 mg/dL  Lactic acid, plasma     Status: Abnormal   Collection  Time: 02/04/18  6:31 PM  Result Value Ref Range   Lactic Acid, Venous 3.3 (HH) 0.5 - 1.9 mmol/L    Comment: CRITICAL RESULT CALLED TO, READ BACK BY AND VERIFIED WITH: BULLINS,H AT 1916 ON 02/04/2018 BY MOSLEY,J Performed at [facility], [place] [address], [place], [place] [address]   Basic metabolic panel     Status: Abnormal   Collection Time: 02/05/18  6:10 AM  Result Value Ref Range   Sodium 138 135 - 145 mmol/L    Comment: DELTA CHECK NOTED   Potassium 4.3 3.5 - 5.1 mmol/L   Chloride 105 98 - 111 mmol/L   CO2 24 22 - 32 mmol/L   Glucose, Bld 204 (H) 70 - 99 mg/dL   BUN 20 8 - 23 mg/dL   Creatinine, Ser 0.65 0.61 - 1.24 mg/dL   Calcium 8.4 (L) 8.9 - 10.3 mg/dL   GFR calc non Af Amer >60 >60 mL/min   GFR calc Af Amer >60 >60 mL/min   Anion gap 9 5 - 15    Comment: Performed at [facility], [place] [address], [place], [place] [address]  CBC     Status: Abnormal   Collection Time: 02/05/18  6:10 AM  Result Value Ref Range   WBC 7.1 4.0 - 10.5 K/uL   RBC 3.67 (L) 4.22 - 5.81 MIL/uL   Hemoglobin 12.1 (L) 13.0 - 17.0 g/dL   HCT 37.3 (L) 39.0 - 52.0 %   MCV 101.6 (H) 80.0 - 100.0 fL   MCH 33.0 26.0 - 34.0 pg   MCHC 32.4 30.0 - 36.0 g/dL   RDW 12.6 11.5 - 15.5 %   Platelets 242 150 - 400 K/uL   nRBC 0.0 0.0 - 0.2 %    Comment: Performed at [facility], [place] [address] [place], [place] [address]  Glucose, capillary     Status: Abnormal   Collection Time: 02/05/18  8:03 AM  Result Value Ref Range   Glucose-Capillary 216 (H) 70 - 99 mg/dL  Glucose, capillary     Status: Abnormal   Collection Time: 02/05/18 11:49 AM  Result Value Ref Range   Glucose-Capillary 201 (H) 70 - 99 mg/dL  Glucose, capillary     Status: Abnormal   Collection Time: 02/05/18  4:38 PM  Result Value Ref Range   Glucose-Capillary 221 (H) 70 - 99 mg/dL  Glucose, capillary     Status: Abnormal   Collection Time: 02/05/18  9:14 PM  Result Value Ref Range    Glucose-Capillary 272 (H) 70 - 99 mg/dL  Glucose, capillary     Status: Abnormal   Collection Time: 02/06/18  7:32 AM  Result Value Ref Range   Glucose-Capillary 213 (H) 70 - 99 mg/dL  Glucose, capillary     Status: Abnormal   Collection Time: 02/06/18 11:46 AM  Result Value Ref Range   Glucose-Capillary 307 (H) 70 - 99 mg/dL  CBC w/Diff     Status: Abnormal   Collection Time: 02/13/18 11:37 AM  Result Value Ref Range   WBC 15.3 (H) 4.0 - 10.5 K/uL   RBC 4.09 (L) 4.22 - 5.81 Mil/uL   Hemoglobin 13.7 13.0 - 17.0 g/dL   HCT 40.8 39.0 - 52.0 %  MCV 99.6 78.0 - 100.0 fl   MCHC 33.7 30.0 - 36.0 g/dL   RDW 13.2 11.5 - 15.5 %   Platelets 270.0 150.0 - 400.0 K/uL   Neutrophils Relative % 82.6 (H) 43.0 - 77.0 %   Lymphocytes Relative 11.0 (L) 12.0 - 46.0 %   Monocytes Relative 3.8 3.0 - 12.0 %   Eosinophils Relative 2.1 0.0 - 5.0 %   Basophils Relative 0.5 0.0 - 3.0 %   Neutro Abs 12.7 (H) 1.4 - 7.7 K/uL   Lymphs Abs 1.7 0.7 - 4.0 K/uL   Monocytes Absolute 0.6 0.1 - 1.0 K/uL   Eosinophils Absolute 0.3 0.0 - 0.7 K/uL   Basophils Absolute 0.1 0.0 - 0.1 K/uL  Comp Met (CMET)     Status: Abnormal   Collection Time: 02/13/18 11:37 AM  Result Value Ref Range   Sodium 132 (L) 135 - 145 mEq/L   Potassium 5.2 (H) 3.5 - 5.1 mEq/L   Chloride 96 96 - 112 mEq/L   CO2 29 19 - 32 mEq/L   Glucose, Bld 162 (H) 70 - 99 mg/dL   BUN 22 6 - 23 mg/dL   Creatinine, Ser 1.14 0.40 - 1.50 mg/dL   Total Bilirubin 0.5 0.2 - 1.2 mg/dL   Alkaline Phosphatase 49 39 - 117 U/L   AST 29 0 - 37 U/L   ALT 43 0 - 53 U/L   Total Protein 6.6 6.0 - 8.3 g/dL   Albumin 3.8 3.5 - 5.2 g/dL   Calcium 9.7 8.4 - 10.5 mg/dL   GFR 65.87 >60.00 mL/min  CBC w/Diff     Status: Abnormal   Collection Time: 03/02/18  9:37 AM  Result Value Ref Range   WBC 5.5 4.0 - 10.5 K/uL   RBC 4.18 (L) 4.22 - 5.81 Mil/uL   Hemoglobin 14.1 13.0 - 17.0 g/dL   HCT 42.0 39.0 - 52.0 %   MCV 100.4 (H) 78.0 - 100.0 fl   MCHC 33.6 30.0 -  36.0 g/dL   RDW 14.2 11.5 - 15.5 %   Platelets 282.0 150.0 - 400.0 K/uL   Neutrophils Relative % 65.7 43.0 - 77.0 %   Lymphocytes Relative 21.9 12.0 - 46.0 %   Monocytes Relative 6.4 3.0 - 12.0 %   Eosinophils Relative 5.1 (H) 0.0 - 5.0 %   Basophils Relative 0.9 0.0 - 3.0 %   Neutro Abs 3.6 1.4 - 7.7 K/uL   Lymphs Abs 1.2 0.7 - 4.0 K/uL   Monocytes Absolute 0.4 0.1 - 1.0 K/uL   Eosinophils Absolute 0.3 0.0 - 0.7 K/uL   Basophils Absolute 0.1 0.0 - 0.1 K/uL  Basic metabolic panel     Status: Abnormal   Collection Time: 03/02/18  9:37 AM  Result Value Ref Range   Sodium 135 135 - 145 mEq/L   Potassium 4.7 3.5 - 5.1 mEq/L   Chloride 98 96 - 112 mEq/L   CO2 30 19 - 32 mEq/L   Glucose, Bld 218 (H) 70 - 99 mg/dL   BUN 23 6 - 23 mg/dL   Creatinine, Ser 0.79 0.40 - 1.50 mg/dL   Calcium 9.8 8.4 - 10.5 mg/dL   GFR 100.57 >60.00 mL/min  CBC with Differential/Platelet     Status: None   Collection Time: 03/10/18  9:41 AM  Result Value Ref Range   WBC 7.5 4.0 - 10.5 K/uL   RBC 4.23 4.22 - 5.81 Mil/uL   Hemoglobin 14.3 13.0 - 17.0 g/dL   HCT 41.9  39.0 - 52.0 %   MCV 99.1 78.0 - 100.0 fl   MCHC 34.2 30.0 - 36.0 g/dL   RDW 14.1 11.5 - 15.5 %   Platelets 253.0 150.0 - 400.0 K/uL   Neutrophils Relative % 74.5 43.0 - 77.0 %   Lymphocytes Relative 17.5 12.0 - 46.0 %   Monocytes Relative 5.6 3.0 - 12.0 %   Eosinophils Relative 1.9 0.0 - 5.0 %   Basophils Relative 0.5 0.0 - 3.0 %   Neutro Abs 5.6 1.4 - 7.7 K/uL   Lymphs Abs 1.3 0.7 - 4.0 K/uL   Monocytes Absolute 0.4 0.1 - 1.0 K/uL   Eosinophils Absolute 0.1 0.0 - 0.7 K/uL   Basophils Absolute 0.0 0.0 - 0.1 K/uL  Comprehensive metabolic panel     Status: Abnormal   Collection Time: 03/10/18  9:41 AM  Result Value Ref Range   Sodium 136 135 - 145 mEq/L   Potassium 4.4 3.5 - 5.1 mEq/L   Chloride 97 96 - 112 mEq/L   CO2 30 19 - 32 mEq/L   Glucose, Bld 224 (H) 70 - 99 mg/dL   BUN 20 6 - 23 mg/dL   Creatinine, Ser 0.81 0.40 - 1.50 mg/dL     Total Bilirubin 0.6 0.2 - 1.2 mg/dL   Alkaline Phosphatase 44 39 - 117 U/L   AST 23 0 - 37 U/L   ALT 31 0 - 53 U/L   Total Protein 7.0 6.0 - 8.3 g/dL   Albumin 4.5 3.5 - 5.2 g/dL   Calcium 9.7 8.4 - 10.5 mg/dL   GFR 91.92 >60.00 mL/min  Hemoglobin A1c     Status: Abnormal   Collection Time: 03/10/18  9:41 AM  Result Value Ref Range   Hgb A1c MFr Bld 8.2 (H) 4.6 - 6.5 %    Comment: Glycemic Control Guidelines for People with Diabetes:Non Diabetic:  <6%Goal of Therapy: <7%Additional Action Suggested:  >8%   Lipid panel     Status: Abnormal   Collection Time: 03/10/18  9:41 AM  Result Value Ref Range   Cholesterol 143 0 - 200 mg/dL    Comment: ATP III Classification       Desirable:  < 200 mg/dL               Borderline High:  200 - 239 mg/dL          High:  > = 240 mg/dL   Triglycerides 80.0 0.0 - 149.0 mg/dL    Comment: Normal:  <150 mg/dLBorderline High:  150 - 199 mg/dL   HDL 35.80 (L) >39.00 mg/dL   VLDL 16.0 0.0 - 40.0 mg/dL   LDL Cholesterol 92 0 - 99 mg/dL   Total CHOL/HDL Ratio 4     Comment:                Men          Women1/2 Average Risk     3.4          3.3Average Risk          5.0          4.42X Average Risk          9.6          7.13X Average Risk          15.0          11.0  NonHDL 107.53     Comment: NOTE:  Non-HDL goal should be 30 mg/dL higher than patient's LDL goal (i.e. LDL goal of < 70 mg/dL, would have non-HDL goal of < 100 mg/dL)  Urine Microalbumin w/creat. ratio     Status: None   Collection Time: 03/10/18 11:55 AM  Result Value Ref Range   Microalb, Ur <0.7 0.0 - 1.9 mg/dL   Creatinine,U 57.7 mg/dL   Microalb Creat Ratio 1.2 0.0 - 30.0 mg/g    Assessment/Plan: 1. Acute superficial venous thrombosis of right lower extremity Continue RICE. Continue Aspirin as directed. No Aleve with this. Tylenol for mild pain. Symptoms will take time to resolve. Will monitor closely.  2. Strain of right gastrocnemius muscle, subsequent  encounter Concern by Orthopedics for partial tear. Has follow-up scheduled Thursday morning with Orthopedics. He has been taking Aleve along with Aspirin 325 mg BID. Stop Aleve. Continue Aspirin per Ortho. Tylenol for mild pain. Very small course of Hydrocodone given for severe pain until follow-up.   Leeanne Rio, PA-C

## 2018-04-12 ENCOUNTER — Telehealth: Payer: Self-pay

## 2018-04-12 NOTE — Telephone Encounter (Signed)
   [facility] Pre-operative Risk Assessment    Request for surgical clearance:  1. What type of surgery is being performed? MRI    2. When is this surgery scheduled? TBD  Within the next few days  3. What type of clearance is required (medical clearance vs. Pharmacy clearance to hold med vs. Both)? Medical  Due to stents  4. Are there any medications that need to be held prior to surgery and how long? None   5. Practice name and name of physician performing surgery? [facility]  Dr.Ronald Gioffre  6. What is your office phone number [phone]   7.   What is your office fax number [phone]  8.   Anesthesia type  not listed    Kathyrn Lass 04/12/2018, 4:45 PM  _________________________________________________________________   (provider comments below)

## 2018-04-13 NOTE — Telephone Encounter (Signed)
   Primary Cardiologist: Dr. Fletcher Anon  Chart reviewed as part of pre-operative protocol coverage. Given past medical history and time since last visit, based on ACC/AHA guidelines, Marcus Norton would be at acceptable risk MRI.   I discussed with DOD, Dr. Irish Lack. Ok to do MRI with coronary stents. If pt may require surgery in the future, another clearance request will need to be sent to our office.   I will route this recommendation to the requesting party via Epic fax function and remove from pre-op pool.  Please call with questions.  Lyda Jester, PA-C 04/13/2018, 8:29 AM

## 2018-04-19 ENCOUNTER — Telehealth: Payer: Self-pay | Admitting: *Deleted

## 2018-04-19 NOTE — Telephone Encounter (Signed)
Duplicate encounter. Clearance was addressed 04/12/18. Will refax. To summarize.       Primary Cardiologist: Dr. Fletcher Anon  Chart reviewed as part of pre-operative protocol coverage. Given past medical history and time since last visit, based on ACC/AHA guidelines, Marcus Norton would be at acceptable risk MRI.   I discussed with DOD, Dr. Irish Lack. Ok to do MRI with coronary stents. If pt may require surgery in the future, another clearance request will need to be sent to our office.   I will route this recommendation to the requesting party via Epic fax function and remove from pre-op pool.  Please call with questions.  Lyda Jester, PA-C 04/13/2018, 8:29 AM

## 2018-04-19 NOTE — Telephone Encounter (Signed)
PRIMARY CARDIOLOGIST -- [facility] Pre-operative Risk Assessment    Request for surgical clearance:  1. What type of surgery is being performed? MRI   RIGHT LEG   2. When is this surgery scheduled? TBD  3. What type of clearance is required (medical clearance vs. Pharmacy clearance to hold med vs. Both)? MEDICAL  (  BECAUSE PATIENT HAS STENTS)  4. Are there any medications that need to be held prior to surgery and how long?N/A   5. Practice name and name of physician performing surgery? [facility]: DR RONALD GIOFFRE   6. What is your office phone number [phone]    7.   What is your office fax number [phone]  8.   Anesthesia type (None, local, MAC, general) ? Leonia Corona 04/19/2018, 2:47 PM  _________________________________________________________________   (provider comments below)

## 2018-04-25 ENCOUNTER — Ambulatory Visit (HOSPITAL_COMMUNITY)
Admission: RE | Admit: 2018-04-25 | Discharge: 2018-04-25 | Disposition: A | Discharge status: Home / Self Care | Payer: Medicare Other | Source: Ambulatory Visit | Attending: Physician Assistant | Admitting: Physician Assistant

## 2018-04-25 DIAGNOSIS — R1310 Dysphagia, unspecified: Secondary | ICD-10-CM | POA: Diagnosis present

## 2018-05-11 ENCOUNTER — Other Ambulatory Visit: Payer: Self-pay | Admitting: Cardiovascular Disease

## 2018-05-11 ENCOUNTER — Other Ambulatory Visit: Payer: Self-pay | Admitting: Physician Assistant

## 2018-06-19 ENCOUNTER — Other Ambulatory Visit: Payer: Self-pay | Admitting: Physician Assistant

## 2018-06-27 ENCOUNTER — Encounter: Payer: Self-pay | Admitting: Physician Assistant

## 2018-06-27 ENCOUNTER — Ambulatory Visit (INDEPENDENT_AMBULATORY_CARE_PROVIDER_SITE_OTHER): Payer: Medicare Other | Admitting: Physician Assistant

## 2018-06-27 ENCOUNTER — Other Ambulatory Visit: Payer: Self-pay

## 2018-06-27 VITALS — BP 120/60 | HR 78

## 2018-06-27 DIAGNOSIS — K219 Gastro-esophageal reflux disease without esophagitis: Secondary | ICD-10-CM | POA: Diagnosis not present

## 2018-06-27 DIAGNOSIS — R1310 Dysphagia, unspecified: Secondary | ICD-10-CM | POA: Diagnosis not present

## 2018-06-27 DIAGNOSIS — IMO0001 Reserved for inherently not codable concepts without codable children: Secondary | ICD-10-CM

## 2018-06-27 DIAGNOSIS — W57XXXA Bitten or stung by nonvenomous insect and other nonvenomous arthropods, initial encounter: Secondary | ICD-10-CM

## 2018-06-27 DIAGNOSIS — E118 Type 2 diabetes mellitus with unspecified complications: Secondary | ICD-10-CM

## 2018-06-27 DIAGNOSIS — Z794 Long term (current) use of insulin: Secondary | ICD-10-CM

## 2018-06-27 DIAGNOSIS — E782 Mixed hyperlipidemia: Secondary | ICD-10-CM | POA: Diagnosis not present

## 2018-06-27 MED ORDER — DOXYCYCLINE HYCLATE 200 MG PO TBEC: 1.0000 | DELAYED_RELEASE_TABLET | Freq: Once | ORAL | 0 refills | Status: AC

## 2018-06-27 NOTE — Progress Notes (Signed)
Virtual Visit via Video   I connected with patient on 06/27/18 at  9:00 AM EDT by a video enabled telemedicine application and verified that I am speaking with the correct person using two identifiers.  Location patient: Home Location provider: [facility], Office Persons participating in the virtual visit: Patient, Provider, [place] (Patina Moore)  I discussed the limitations of evaluation and management by telemedicine and the availability of in person appointments. The patient expressed understanding and agreed to proceed.  Subjective:   HPI:     History of Present Illness: Patient is a 78 y.o. male who presents to clinic today for follow-up of Diabetes Mellitus II, uncontrolled.  Patient currently on medication regimen of Januvia 100mg  QD, Metformin 1000 mg BID and Lantus QAM.  Endorses taking medications as directed. Endorses only taking 22 units of his Lantus daily. Has not titrated as directed.  Denies polyuria, polydipsia or polyphagia. Is checking blood glucose as directed. Notes fasting glucose levels averaging 140-180 currently. Does try to keep a well-balanced diet but notes he is eating late-night snacks recently. Has eye exam scheduled for summer. Was pushed out due to [place]. Patient is up-to-date on immunizations.   Patient also endorses several tick bites in the past month, most recently taking one off yesterday. Notes area is red. Denies fever, chills, bulls-eye rash, headache. Is wondering is he should take Doxycycline as he notes his "old doctor" in [place] gave him an ongoing prescription and he would take a week's course with each tick bite.   Latest Maintenance: A1C --  Lab Results  Component Value Date   HGBA1C 8.2 (H) 03/10/2018   Diabetic Eye Exam -- Due. Urine Microalbumin -- UTD Foot Exam -- UTD  Past Medical History:  Diagnosis Date  . 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 Arthritis    "lower back" (07/29/2016)  . ASCVD (arteriosclerotic  cardiovascular disease)    -luminal irregularities in 1998 and 2005; normal EF  . Benign prostatic hypertrophy    status post transurethral resection of the prostate  . CAD (coronary artery disease)    a. Cath 07/29/16 99% ostial RCA & 70% pRCA s/p cutting balloon angioplasty and single SYNERGY DES 2.5X28. 100% very Distal 1st RPLB lesion --> the occlusion site moved further distal with guidewire advancement;  40% ostial LAD; 50% Ost Cx to Prox Cx lesion.  . Coronary artery disease   . Dysphagia   . Erectile dysfunction   . GERD (gastroesophageal reflux disease)   . Heart murmur   . History of hiatal hernia   . History of kidney stones   . Hyperlipidemia   . Hypertension   . Seasonal allergies   . Tobacco abuse    20 pack years; discontinued 1995  . Type II diabetes mellitus ([place])     Current Outpatient Medications on File Prior to Visit  Medication Sig Dispense Refill  . ACCU-CHEK AVIVA PLUS test strip USE TO TEST BLOOD SUAGR TWICE DAILY 200 each 2  . albuterol (PROVENTIL HFA;VENTOLIN HFA) 108 (90 Base) MCG/ACT inhaler Inhale 2 puffs into the lungs every 6 (six) hours as needed for wheezing or shortness of breath. 1 Inhaler 0  . alfuzosin (UROXATRAL) 10 MG 24 hr tablet Take 10 mg by mouth daily with breakfast.    . amLODipine (NORVASC) 5 MG tablet TAKE 1 TABLET(5 MG) BY MOUTH DAILY 90 tablet 1  . aspirin 325 MG EC tablet Take 325 mg by mouth 2 (two) times daily.    [name] atorvastatin (  LIPITOR) 10 MG tablet Take 1 tablet (10 mg total) by mouth at bedtime.    [name] azelastine (ASTELIN) 0.1 % nasal spray Place 2 sprays into both nostrils 2 (two) times daily. Use in each nostril as directed 30 mL 12  . docusate sodium (COLACE) 100 MG capsule Take 100 mg by mouth as needed for mild constipation.     . Insulin Pen Needle (B-D ULTRAFINE III SHORT PEN) 31G X 8 MM MISC Inject insulin SQ daily Dx:E11.8 100 each 3  . JANUVIA 100 MG tablet TAKE 1 TABLET(100 MG) BY MOUTH DAILY 90 tablet 1  . LANTUS  SOLOSTAR 100 UNIT/ML Solostar Pen Inject 20 Units into the skin daily at 10 pm. Increase by 2 units every 3 days (Patient taking differently: Inject 22 Units into the skin daily at 10 pm. Increase by 2 units every 3 days)    . losartan (COZAAR) 25 MG tablet TAKE 1 TABLET(25 MG) BY MOUTH DAILY 90 tablet 1  . Magnesium 200 MG TABS Take 1 tablet (200 mg total) by mouth daily. 30 each 6  . metFORMIN (GLUCOPHAGE) 1000 MG tablet TAKE 1 TABLET(1000 MG) BY MOUTH TWICE DAILY WITH A MEAL 180 tablet 0  . metoprolol tartrate (LOPRESSOR) 25 MG tablet TAKE 1 TABLET(25 MG) BY MOUTH TWICE DAILY 180 tablet 1  . pantoprazole (PROTONIX) 40 MG tablet TAKE 1 TABLET(40 MG) BY MOUTH DAILY 30 tablet 3  . tadalafil (CIALIS) 20 MG tablet Take 0.5-1 tablets (10-20 mg total) by mouth every other day as needed for erectile dysfunction. 5 tablet 1   No current facility-administered medications on file prior to visit.     Allergies  Allergen Reactions  . Plavix [Clopidogrel Bisulfate] Shortness Of Breath  . Oxycontin [Oxycodone Hcl] Other (See Comments)    Malaise  . Penicillins Other (See Comments)    [place] Has patient had a PCN reaction causing immediate rash, facial/tongue/throat swelling, SOB or lightheadedness with hypotension: No Has patient had a PCN reaction causing severe rash involving mucus membranes or skin necrosis: No Has patient had a PCN reaction that required hospitalization No Has patient had a PCN reaction occurring within the last 10 years: No If all of the above answers are "NO", then may proceed with Cephalosporin use.   . Pioglitazone Other (See Comments)    Made his chest hurt  . Prednisone Other (See Comments)    Reaction: muscle aches and soreness  . Simvastatin Other (See Comments)    Myalgias    Family History  Problem Relation Age of Onset  . Ovarian cancer Mother   . Colon cancer Sister 34       deceased from colon cancer    Social History   Socioeconomic History  .  Marital status: Married    Spouse name: Not on file  . Number of children: 2  . Years of education: Not on file  . Highest education level: Not on file  Occupational History  . 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 Financial resource strain: Not on file  . Food insecurity:    Worry: Not on file    Inability: Not on file  . Transportation needs:    Medical: Not on file    Non-medical: Not on file  Tobacco Use  . Smoking status: Former Smoker    Packs/day: 1.00    Years: 15.00    Pack years: 15.00    Types: Cigarettes  Start date: 03/08/1965    Last attempt to quit: 07/07/1993    Years since quitting: 24.9  . Smokeless tobacco: Never Used  Substance and Sexual Activity  . Alcohol use: No    Alcohol/week: 0.0 standard drinks    Frequency: Never    Comment: 07/29/2016 "last drink was 4 wks ago; had a beer"  . Drug use: No  . Sexual activity: Not Currently  Lifestyle  . Physical activity:    Days per week: Not on file    Minutes per session: Not on file  . Stress: Not on file  Relationships  . Social connections:    Talks on phone: Not on file    Gets together: Not on file    Attends religious service: Not on file    Active member of club or organization: Not on file    Attends meetings of clubs or organizations: Not on file    Relationship status: Not on file  Other Topics Concern  . Not on file  Social History Narrative  . Not on file   Review of Systems: Pertinent ROS are listed in HPI  Objective:   BP 120/60   Pulse 78   Patient is well-developed, well-nourished in no acute distress.  Resting comfortably at home.  Head is normocephalic, atraumatic.  No labored breathing.  Speech is clear and coherent with logical content.  Patient is alert and oriented at baseline.   Assessment and Plan:   1. Insulin dependent diabetes mellitus with complications (HCC) Continue oral glycemic agents  as directed. Increase Lantus by 2 units. He is to work on late-night snacking. Reviewed titration again with him. Lab appointment scheduled for Wed for repeat fasting lipids.   2. Gastroesophageal reflux disease, esophagitis presence not specified 3. Dysphagia, unspecified type Swallow study unremarkable. Still having symptoms despite PPI. Referral to GI placed.   4. Mixed hyperlipidemia Continue statin medication. Order for fasting lipids placed.  5. Tick bite, initial encounter No signs/symptoms present concerning for tick-borne illness or cellulitis. Discussed there is no need for doxy course with uncomplicated tick bite. Current tick present for 12 hours. Due to his concerns I did send in a prophylactic dose of Doxycycline.   - Doxycycline Hyclate 200 MG TBEC; Take 1 tablet by mouth once for 1 dose.  Dispense: 1 tablet; Refill: 0    Leeanne Rio, [place] 06/27/2018

## 2018-06-27 NOTE — Progress Notes (Signed)
I have discussed the procedure for the virtual visit with the patient who has given consent to proceed with assessment and treatment.   Kambry Takacs S Simra Fiebig, CMA     

## 2018-07-03 ENCOUNTER — Other Ambulatory Visit: Payer: Self-pay

## 2018-07-03 ENCOUNTER — Other Ambulatory Visit: Payer: Self-pay | Admitting: Physician Assistant

## 2018-07-03 ENCOUNTER — Other Ambulatory Visit (INDEPENDENT_AMBULATORY_CARE_PROVIDER_SITE_OTHER): Payer: Medicare Other

## 2018-07-03 DIAGNOSIS — E119 Type 2 diabetes mellitus without complications: Secondary | ICD-10-CM | POA: Diagnosis not present

## 2018-07-03 LAB — COMPREHENSIVE METABOLIC PANEL
ALT: 23 U/L (ref 0–53)
AST: 15 U/L (ref 0–37)
Albumin: 4.4 g/dL (ref 3.5–5.2)
Alkaline Phosphatase: 59 U/L (ref 39–117)
BUN: 22 mg/dL (ref 6–23)
CO2: 31 mEq/L (ref 19–32)
Calcium: 9.5 mg/dL (ref 8.4–10.5)
Chloride: 99 mEq/L (ref 96–112)
Creatinine, Ser: 0.77 mg/dL (ref 0.40–1.50)
GFR: 97.38 mL/min (ref >60.00)
Glucose, Bld: 198 mg/dL — ABNORMAL HIGH (ref 70–99)
Potassium: 5.1 mEq/L (ref 3.5–5.1)
Sodium: 139 mEq/L (ref 135–145)
Total Bilirubin: 0.6 mg/dL (ref 0.2–1.2)
Total Protein: 6.7 g/dL (ref 6.0–8.3)

## 2018-07-03 LAB — HEMOGLOBIN A1C: Hgb A1c MFr Bld: 8.2 % — ABNORMAL HIGH (ref 4.6–6.5)

## 2018-07-03 MED ORDER — NITROGLYCERIN 0.4 MG SL SUBL: 0.4000 mg | SUBLINGUAL_TABLET | SUBLINGUAL | 0 refills | Status: AC | PRN

## 2018-07-12 ENCOUNTER — Encounter: Payer: Self-pay | Admitting: Adult Health

## 2018-07-12 ENCOUNTER — Ambulatory Visit: Payer: Medicare Other | Admitting: Adult Health

## 2018-07-12 ENCOUNTER — Other Ambulatory Visit: Payer: Self-pay

## 2018-07-12 DIAGNOSIS — G2581 Restless legs syndrome: Secondary | ICD-10-CM | POA: Diagnosis not present

## 2018-07-12 DIAGNOSIS — G4733 Obstructive sleep apnea (adult) (pediatric): Secondary | ICD-10-CM

## 2018-07-12 NOTE — Progress Notes (Signed)
@Patient  ID: Marcus Norton, male    DOB: [birth year], 78 y.o.   MRN: [id number]  Chief Complaint  Patient presents with  . Follow-up    here to discuss using CPAP - has not started yet    Referring provider: Delorse Limber  HPI: 78 year old male seen for sleep consult December 2019 for OSA  Medical history significant for HTN, DM   TEST/EVENTS :  HST 12/2017 AHI 28/hr , O2 sats low 79%   07/12/2018 Follow up : OSA  Patient returns for a 48-month follow-up.  Patient was seen for a sleep consult December 2019.  Patient snoring and daytime sleepiness.  He had a sleep study that was done November 2019 that showed moderate sleep apnea with AHI 28/hour.  Patient was recommended to begin nocturnal CPAP.  He declined at that time.  Patient also has restless leg syndrome.  He was started on Requip.  Patient returns today to discuss getting started on CPAP. He has snoring , restless sleep and daytime sleepiness.  He was started on requip for restless leg but says he did not like this and did not feel this worked.     Allergies  Allergen Reactions  . Plavix [Clopidogrel Bisulfate] Shortness Of Breath  . Oxycontin [Oxycodone Hcl] Other (See Comments)    Malaise  . Penicillins Other (See Comments)    [place] Has patient had a PCN reaction causing immediate rash, facial/tongue/throat swelling, SOB or lightheadedness with hypotension: No Has patient had a PCN reaction causing severe rash involving mucus membranes or skin necrosis: No Has patient had a PCN reaction that required hospitalization No Has patient had a PCN reaction occurring within the last 10 years: No If all of the above answers are "NO", then may proceed with Cephalosporin use.   . Pioglitazone Other (See Comments)    Made his chest hurt  . Prednisone Other (See Comments)    Reaction: muscle aches and soreness  . Simvastatin Other (See Comments)    Myalgias    Immunization History  Administered Date(s)  Administered  . Influenza, High Dose Seasonal PF 11/03/2015, 11/17/2017  . Influenza,inj,Quad PF,6+ Mos 11/20/2016  . Influenza,inj,quad, With Preservative 11/22/2016  . Influenza-Unspecified 11/20/2016  . Pneumococcal Conjugate-13 03/04/2016  . Pneumococcal Polysaccharide-23 03/08/2017  . Td 03/11/2011  . Zoster Recombinat (Shingrix) 05/01/2016, 10/05/2016    Past Medical History:  Diagnosis Date  . 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 Arthritis    "lower back" (07/29/2016)  . ASCVD (arteriosclerotic cardiovascular disease)    -luminal irregularities in 1998 and 2005; normal EF  . Benign prostatic hypertrophy    status post transurethral resection of the prostate  . CAD (coronary artery disease)    a. Cath 07/29/16 99% ostial RCA & 70% pRCA s/p cutting balloon angioplasty and single SYNERGY DES 2.5X28. 100% very Distal 1st RPLB lesion --> the occlusion site moved further distal with guidewire advancement;  40% ostial LAD; 50% Ost Cx to Prox Cx lesion.  . Coronary artery disease   . Dysphagia   . Erectile dysfunction   . GERD (gastroesophageal reflux disease)   . Heart murmur   . History of hiatal hernia   . History of kidney stones   . Hyperlipidemia   . Hypertension   . Seasonal allergies   . Tobacco abuse    20 pack years; discontinued 1995  . Type II diabetes mellitus (HCC)     Tobacco History: Social History   Tobacco Use  Smoking  Status Former Smoker  . Packs/day: 1.00  . Years: 15.00  . Pack years: 15.00  . Types: Cigarettes  . Start date: 03/08/1965  . Last attempt to quit: 07/07/1993  . Years since quitting: 25.0  Smokeless Tobacco Never Used   Counseling given: Not Answered   Outpatient Medications Prior to Visit  Medication Sig Dispense Refill  . ACCU-CHEK AVIVA PLUS test strip USE TO TEST BLOOD SUAGR TWICE DAILY 200 each 2  . alfuzosin (UROXATRAL) 10 MG 24 hr tablet Take 10 mg by mouth daily with breakfast.    . amLODipine (NORVASC) 5 MG tablet TAKE 1  TABLET(5 MG) BY MOUTH DAILY 90 tablet 1  . aspirin 325 MG EC tablet Take 325 mg by mouth daily.     [name] atorvastatin (LIPITOR) 10 MG tablet Take 1 tablet (10 mg total) by mouth at bedtime.    . Insulin Pen Needle (B-D ULTRAFINE III SHORT PEN) 31G X 8 MM MISC Inject insulin SQ daily Dx:E11.8 100 each 3  . JANUVIA 100 MG tablet TAKE 1 TABLET(100 MG) BY MOUTH DAILY 90 tablet 1  . LANTUS SOLOSTAR 100 UNIT/ML Solostar Pen Inject 20 Units into the skin daily at 10 pm. Increase by 2 units every 3 days (Patient taking differently: Inject 22 Units into the skin daily at 10 pm. Increase by 24 units every 3 days)    . losartan (COZAAR) 25 MG tablet TAKE 1 TABLET(25 MG) BY MOUTH DAILY 90 tablet 1  . metFORMIN (GLUCOPHAGE) 1000 MG tablet TAKE 1 TABLET(1000 MG) BY MOUTH TWICE DAILY WITH A MEAL 180 tablet 0  . metoprolol tartrate (LOPRESSOR) 25 MG tablet TAKE 1 TABLET(25 MG) BY MOUTH TWICE DAILY 180 tablet 1  . nitroGLYCERIN (NITROSTAT) 0.4 MG SL tablet Place 1 tablet (0.4 mg total) under the tongue every 5 (five) minutes as needed for chest pain. 5 tablet 0  . pantoprazole (PROTONIX) 40 MG tablet TAKE 1 TABLET(40 MG) BY MOUTH DAILY 30 tablet 3  . tadalafil (CIALIS) 20 MG tablet Take 0.5-1 tablets (10-20 mg total) by mouth every other day as needed for erectile dysfunction. 5 tablet 1  . albuterol (PROVENTIL HFA;VENTOLIN HFA) 108 (90 Base) MCG/ACT inhaler Inhale 2 puffs into the lungs every 6 (six) hours as needed for wheezing or shortness of breath. (Patient not taking: Reported on 07/12/2018) 1 Inhaler 0  . azelastine (ASTELIN) 0.1 % nasal spray Place 2 sprays into both nostrils 2 (two) times daily. Use in each nostril as directed (Patient not taking: Reported on 07/12/2018) 30 mL 12  . docusate sodium (COLACE) 100 MG capsule Take 100 mg by mouth as needed for mild constipation.     . Magnesium 200 MG TABS Take 1 tablet (200 mg total) by mouth daily. (Patient not taking: Reported on 07/12/2018) 30 each 6   No  facility-administered medications prior to visit.      Review of Systems:   Constitutional:   No  weight loss, night sweats,  Fevers, chills,  +fatigue, or  lassitude.  HEENT:   No headaches,  Difficulty swallowing,  Tooth/dental problems, or  Sore throat,                No sneezing, itching, ear ache, nasal congestion, post nasal drip,   CV:  No chest pain,  Orthopnea, PND, swelling in lower extremities, anasarca, dizziness, palpitations, syncope.   GI  No heartburn, indigestion, abdominal pain, nausea, vomiting, diarrhea, change in bowel habits, loss of appetite, bloody stools.   Resp:  No shortness of breath with exertion or at rest.  No excess mucus, no productive cough,  No non-productive cough,  No coughing up of blood.  No change in color of mucus.  No wheezing.  No chest wall deformity  Skin: no rash or lesions.  GU: no dysuria, change in color of urine, no urgency or frequency.  No flank pain, no hematuria   MS:  No joint pain or swelling.  No decreased range of motion.  No back pain.    Physical Exam  BP 136/64 (BP Location: Left Arm, Patient Position: Sitting, Cuff Size: Normal)   Pulse 70   Temp 98.3 F (36.8 C)   Ht 5\' 7"  (1.702 m)   Wt 183 lb (83 kg)   SpO2 96%   BMI 28.66 kg/m   GEN: A/Ox3; pleasant , NAD, well nourished    HEENT:  [place]/AT,  EACs-clear, TMs-wnl, NOSE-clear, THROAT-clear, no lesions, no postnasal drip or exudate noted. Class 2 MP airway   NECK:  Supple w/ fair ROM; no JVD; normal carotid impulses w/o bruits; no thyromegaly or nodules palpated; no lymphadenopathy.    RESP  Clear  P & A; w/o, wheezes/ rales/ or rhonchi. no accessory muscle use, no dullness to percussion  CARD:  RRR, no m/r/g, no peripheral edema, pulses intact, no cyanosis or clubbing.  GI:   Soft & nt; nml bowel sounds; no organomegaly or masses detected.   Musco: Warm bil, no deformities or joint swelling noted.   Neuro: alert, no focal deficits noted.    Skin: Warm,  no lesions or rashes    Lab Results:  CBC  No results found for: BNP  ProBNP No results found for: PROBNP  Imaging: No results found.    No flowsheet data found.  No results found for: NITRICOXIDE      Assessment & Plan:   OSA (obstructive sleep apnea) Moderate sleep apnea-with comorbidities of hypertension and diabetes.  Patient has significant daytime symptoms with hypersomnolence.  He would like to proceed with CPAP.  Patient education on sleep apnea and on CPAP usage.  Plan  Patient Instructions  Begin CPAP At bedtime  .  Wear CPAP each night for at least 4-6 hr .  Work on healthy weight.  Do not drive if sleepy.  Follow up with Dr. Ander Slade or Parrett NP in 2 months and As needed         Restless leg syndrome No perceived benefit from Requip  Patient does not feel that this is a significant problem currently.  We will continue to monitor this.     Rexene Edison, NP 07/12/2018

## 2018-07-12 NOTE — Assessment & Plan Note (Signed)
Moderate sleep apnea-with comorbidities of hypertension and diabetes.  Patient has significant daytime symptoms with hypersomnolence.  He would like to proceed with CPAP.  Patient education on sleep apnea and on CPAP usage.  Plan  Patient Instructions  Begin CPAP At bedtime  .  Wear CPAP each night for at least 4-6 hr .  Work on healthy weight.  Do not drive if sleepy.  Follow up with Dr. Ander Slade or Parrett NP in 2 months and As needed

## 2018-07-12 NOTE — Assessment & Plan Note (Signed)
No perceived benefit from Requip  Patient does not feel that this is a significant problem currently.  We will continue to monitor this.

## 2018-07-12 NOTE — Addendum Note (Signed)
Addended by: Joella Prince on: 07/12/2018 10:19 AM   Modules accepted: Orders

## 2018-07-12 NOTE — Patient Instructions (Addendum)
Begin CPAP At bedtime  .  Wear CPAP each night for at least 4-6 hr .  Work on healthy weight.  Do not drive if sleepy.  Follow up with Dr. Ander Slade or Parrett NP in 2 months and As needed

## 2018-07-25 ENCOUNTER — Other Ambulatory Visit: Payer: Self-pay | Admitting: Physician Assistant

## 2018-07-25 DIAGNOSIS — K219 Gastro-esophageal reflux disease without esophagitis: Secondary | ICD-10-CM

## 2018-08-02 ENCOUNTER — Ambulatory Visit (HOSPITAL_BASED_OUTPATIENT_CLINIC_OR_DEPARTMENT_OTHER)
Admission: RE | Admit: 2018-08-02 | Discharge: 2018-08-02 | Disposition: A | Discharge status: Home / Self Care | Payer: Medicare Other | Source: Ambulatory Visit | Attending: Cardiovascular Disease | Admitting: Cardiovascular Disease

## 2018-08-02 ENCOUNTER — Other Ambulatory Visit: Payer: Self-pay

## 2018-08-02 ENCOUNTER — Ambulatory Visit (HOSPITAL_COMMUNITY)
Admission: RE | Admit: 2018-08-02 | Discharge: 2018-08-02 | Disposition: A | Discharge status: Home / Self Care | Payer: Medicare Other | Source: Ambulatory Visit | Attending: Cardiology | Admitting: Cardiology

## 2018-08-02 ENCOUNTER — Other Ambulatory Visit (HOSPITAL_COMMUNITY): Payer: Self-pay | Admitting: Cardiovascular Disease

## 2018-08-02 DIAGNOSIS — Z95828 Presence of other vascular implants and grafts: Secondary | ICD-10-CM

## 2018-08-02 DIAGNOSIS — I739 Peripheral vascular disease, unspecified: Secondary | ICD-10-CM | POA: Insufficient documentation

## 2018-08-04 ENCOUNTER — Telehealth: Payer: Self-pay | Admitting: *Deleted

## 2018-08-04 NOTE — Telephone Encounter (Signed)
Left a message for the patient to call back.  He already has an appointment on 08/22/2018 with Dr. Fletcher Anon

## 2018-08-04 NOTE — Telephone Encounter (Signed)
-----   Message from Wellington Hampshire, MD sent at 08/02/2018  3:41 PM EDT ----- Inform patient that ABI is worse especially on the right side. I recommend an office visit (not virtual) on the 16th or 30th.

## 2018-08-07 ENCOUNTER — Other Ambulatory Visit: Payer: Self-pay | Admitting: Physician Assistant

## 2018-08-07 NOTE — Telephone Encounter (Signed)
Patient made aware of results and verbalized understanding. The patient has been made aware to keep his appointment on 08/22/2018

## 2018-08-07 NOTE — Telephone Encounter (Signed)

## 2018-08-14 ENCOUNTER — Other Ambulatory Visit: Payer: Self-pay | Admitting: Physician Assistant

## 2018-08-21 ENCOUNTER — Telehealth: Payer: Self-pay | Admitting: Cardiovascular Disease

## 2018-08-21 NOTE — Telephone Encounter (Signed)
LVM, reminding pt of his appt with Dr Fletcher Anon on 08-22-18.

## 2018-08-22 ENCOUNTER — Other Ambulatory Visit: Payer: Self-pay

## 2018-08-22 ENCOUNTER — Telehealth: Payer: Self-pay | Admitting: Cardiovascular Disease

## 2018-08-22 ENCOUNTER — Encounter: Payer: Self-pay | Admitting: Cardiovascular Disease

## 2018-08-22 ENCOUNTER — Ambulatory Visit (INDEPENDENT_AMBULATORY_CARE_PROVIDER_SITE_OTHER): Payer: Medicare Other | Admitting: Cardiovascular Disease

## 2018-08-22 VITALS — BP 122/72 | HR 61 | Ht 67.0 in | Wt 182.0 lb

## 2018-08-22 DIAGNOSIS — I739 Peripheral vascular disease, unspecified: Secondary | ICD-10-CM

## 2018-08-22 DIAGNOSIS — E782 Mixed hyperlipidemia: Secondary | ICD-10-CM | POA: Diagnosis not present

## 2018-08-22 DIAGNOSIS — I2510 Atherosclerotic heart disease of native coronary artery without angina pectoris: Secondary | ICD-10-CM | POA: Diagnosis not present

## 2018-08-22 DIAGNOSIS — I10 Essential (primary) hypertension: Secondary | ICD-10-CM | POA: Diagnosis not present

## 2018-08-22 NOTE — Telephone Encounter (Signed)
Spoke to pt wife--ok per DPR-- and informed that pt is scheduled for appointment with PharmD to discuss starting Repatha on Monday, 09/11/18 at 9:30 AM. Pt wife verbalized understanding and thanks for the call.

## 2018-08-22 NOTE — Patient Instructions (Signed)
Medication Instructions:  Your physician recommends that you continue on your current medications as directed. Please refer to the Current Medication list given to you today.  If you need a refill on your cardiac medications before your next appointment, please call your pharmacy.   Follow-Up: At [facility] you and your health needs are our priority.  As part of our continuing mission to provide you with exceptional heart care, we have created designated Provider Care Teams.  These Care Teams include your primary Cardiologist (physician) and Advanced Practice Providers (APPs -  Physician Assistants and Nurse Practitioners) who all work together to provide you with the care you need, when you need it. You will need a follow up appointment in 6 months.  Please call our office 2 months in advance to schedule this appointment.  You may see Dr. Fletcher Anon or one of the following Advanced Practice Providers on your designated Care Team:   Kerin Ransom, PA-C Roby Lofts, [place] . Sande Rives, PA-C  Any Other Special Instructions Will Be Listed Below (If Applicable). You have been referred to our [facility] to discuss starting Repatha to manage your cholesterol. Someone will call you to schedule this appointment.

## 2018-08-22 NOTE — Progress Notes (Signed)
Cardiology Office Note   Date:  08/22/2018   ID:  Tayshun, Gappa October 01, 1940, MRN [id number]  PCP:  [name], PA-C  Cardiologist: Dr. Fletcher Anon  No chief complaint on file.     History of Present Illness: Marcus Norton is a 78 y.o. male who presents for a follow-up visit regarding claudication and PAD. He has known history of  paroxysmal supraventricular tachycardia,  coronary artery disease, mild to moderate aortic stenosis, type 2 diabetes since 1990, hypertension and hyperlipidemia. He has known history of claudication . He did not tolerate cilostazol due to palpitations. He is s/p bilateral common iliac artery kissing stent placement in 11/2015. 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   He had abnormal nuclear stress test in 2018.  Cardiac catheterization in June, 2018 showed severe ostial RCA stenosis and occluded RPL2 with nonobstructive disease affecting the left system.  He underwent successful angioplasty and drug-eluting stent placement to the ostial right coronary artery. He also has mild aortic stenosis. He had recent vascular studies done which showed a drop in ABI bilaterally with patent iliac stents.  He reports exertional dyspnea and fatigue which limits his activity is more than claudication.  He has known history of intolerance to statins and currently takes half a tablet of atorvastatin 10 mg daily.  He reports that he tried all other medications with intolerance due to myalgia.   Past Medical History:  Diagnosis Date  . 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 Arthritis    "lower back" (07/29/2016)  . ASCVD (arteriosclerotic cardiovascular disease)    -luminal irregularities in 1998 and 2005; normal EF  . Benign prostatic hypertrophy    status post transurethral resection of the prostate  . CAD (coronary artery  disease)    a. Cath 07/29/16 99% ostial RCA & 70% pRCA s/p cutting balloon angioplasty and single SYNERGY DES 2.5X28. 100% very Distal 1st RPLB lesion --> the occlusion site moved further distal with guidewire advancement;  40% ostial LAD; 50% Ost Cx to Prox Cx lesion.  . Coronary artery disease   . Dysphagia   . Erectile dysfunction   . GERD (gastroesophageal reflux disease)   . Heart murmur   . History of hiatal hernia   . History of kidney stones   . Hyperlipidemia   . Hypertension   . Seasonal allergies   . Tobacco abuse    20 pack years; discontinued 1995  . Type II diabetes mellitus ([place])     Past Surgical History:  Procedure Laterality Date  . APPENDECTOMY    . CATARACT EXTRACTION W/ INTRAOCULAR LENS  IMPLANT, BILATERAL Bilateral   . COLONOSCOPY  07/20/2011   Dr. Gala Romney: multiple hyperplastic polyps. Surveillance 2018  . COLONOSCOPY N/A 06/30/2016   Procedure: COLONOSCOPY;  Surgeon: Daneil Dolin, MD;  Location: [facility];  Service: Endoscopy;  Laterality: N/A;  10:00am  . COLONOSCOPY W/ BIOPSIES AND POLYPECTOMY  07/08/08, 06/2011   Dr Madolyn Frieze papilla, diminutive rectal polyp ablated the, left-sided diverticula, piecemeal polypectomy of hepatic flexure adenomatous polyp, diminutive polyps near hepatic flexure ablated  . CORONARY ANGIOPLASTY WITH STENT PLACEMENT  07/29/2016  . CORONARY STENT INTERVENTION N/A 07/29/2016   Procedure: Coronary Stent Intervention;  Surgeon: Leonie Man, MD;  Location: [facility];  Service: Cardiovascular;  Laterality: N/A;  RCA  . ESOPHAGOGASTRODUODENOSCOPY  2013  erosive reflux esophagitis, s/p empiric dilation. chronic duodenitis.   [name] LEFT HEART CATH AND CORONARY ANGIOGRAPHY N/A 07/29/2016   Procedure: Left Heart Cath and Coronary Angiography;  Surgeon: Leonie Man, MD;  Location: [facility];  Service: Cardiovascular;  Laterality: N/A;  . NASAL SEPTUM SURGERY    . NASAL SINUS SURGERY     "cleaned out my sinus"  .  PERIPHERAL VASCULAR CATHETERIZATION N/A 11/26/2015   Procedure: Abdominal Aortogram w/Lower Extremity;  Surgeon: Wellington Hampshire, MD;  Location: [facility];  Service: Cardiovascular;  Laterality: N/A;  . TONSILLECTOMY    . TRANSURETHRAL RESECTION OF PROSTATE  2009     Current Outpatient Medications  Medication Sig Dispense Refill  . ACCU-CHEK AVIVA PLUS test strip USE TO TEST BLOOD SUAGR TWICE DAILY 200 each 2  . alfuzosin (UROXATRAL) 10 MG 24 hr tablet Take 10 mg by mouth daily with breakfast.    . amLODipine (NORVASC) 5 MG tablet TAKE 1 TABLET(5 MG) BY MOUTH DAILY 90 tablet 1  . aspirin 325 MG EC tablet Take 325 mg by mouth daily.     [name] atorvastatin (LIPITOR) 10 MG tablet TAKE 1 TABLET(10 MG) BY MOUTH DAILY 90 tablet 1  . docusate sodium (COLACE) 100 MG capsule Take 100 mg by mouth as needed for mild constipation.     . Insulin Pen Needle (B-D ULTRAFINE III SHORT PEN) 31G X 8 MM MISC Inject insulin SQ daily Dx:E11.8 100 each 3  . JANUVIA 100 MG tablet TAKE 1 TABLET(100 MG) BY MOUTH DAILY 90 tablet 1  . LANTUS SOLOSTAR 100 UNIT/ML Solostar Pen Inject 20 Units into the skin daily at 10 pm. Increase by 2 units every 3 days (Patient taking differently: Inject 22 Units into the skin daily at 10 pm. Increase by 24 units every 3 days)    . losartan (COZAAR) 25 MG tablet TAKE 1 TABLET(25 MG) BY MOUTH DAILY 90 tablet 1  . metFORMIN (GLUCOPHAGE) 1000 MG tablet TAKE 1 TABLET BY MOUTH TWICE DAILY WITH MEALS 180 tablet 1  . metoprolol tartrate (LOPRESSOR) 25 MG tablet TAKE 1 TABLET(25 MG) BY MOUTH TWICE DAILY 180 tablet 1  . MULTIPLE VITAMIN PO Take 1 tablet by mouth daily.    . nitroGLYCERIN (NITROSTAT) 0.4 MG SL tablet Place 1 tablet (0.4 mg total) under the tongue every 5 (five) minutes as needed for chest pain. 5 tablet 0  . pantoprazole (PROTONIX) 40 MG tablet TAKE 1 TABLET(40 MG) BY MOUTH DAILY 90 tablet 1  . tadalafil (CIALIS) 20 MG tablet Take 0.5-1 tablets (10-20 mg total) by mouth  every other day as needed for erectile dysfunction. 5 tablet 1  . albuterol (PROVENTIL HFA;VENTOLIN HFA) 108 (90 Base) MCG/ACT inhaler Inhale 2 puffs into the lungs every 6 (six) hours as needed for wheezing or shortness of breath. (Patient not taking: Reported on 07/12/2018) 1 Inhaler 0  . azelastine (ASTELIN) 0.1 % nasal spray Place 2 sprays into both nostrils 2 (two) times daily. Use in each nostril as directed (Patient not taking: Reported on 07/12/2018) 30 mL 12   No current facility-administered medications for this visit.     Allergies:   Plavix [clopidogrel bisulfate], Oxycontin [oxycodone hcl], Penicillins, Pioglitazone, Prednisone, and Simvastatin    Social History:  The patient  reports that he quit smoking about 25 years ago. His smoking use included cigarettes. He started smoking about 53 years ago. He has a 15.00 pack-year smoking history. He has never used smokeless tobacco. He  reports that he does not drink alcohol or use drugs.   Family History:  The patient's family history includes Colon cancer (age of onset: 29) in his sister; Ovarian cancer in his mother.    ROS:  Please see the history of present illness.   Otherwise, review of systems are positive for none.   All other systems are reviewed and negative.    PHYSICAL EXAM: VS:  BP 122/72   Pulse 61   Ht 5\' 7"  (1.702 m)   Wt 182 lb (82.6 kg)   BMI 28.51 kg/m  , BMI Body mass index is 28.51 kg/m. GEN: Well nourished, well developed, in no acute distress  HEENT: normal  Neck: no JVD, carotid bruits, or masses Cardiac: RRR; no  rubs, or gallops,no edema . 2/6 crescendo decrescendo systolic murmur in the aortic area which is mid peaking Respiratory:  clear to auscultation bilaterally, normal work of breathing GI: soft, nontender, nondistended, + BS MS: no deformity or atrophy  Skin: warm and dry, no rash Neuro:  Strength and sensation are intact Psych: euthymic mood, full affect Vascular: Femoral pulse: Normal  bilaterally.  Distal pulses are not palpable  EKG:  EKG ordered today. EKG showed normal sinus rhythm with incomplete right bundle branch block, left axis deviation and old septal infarct.  Recent Labs: 03/10/2018: Hemoglobin 14.3; Platelets 253.0 07/03/2018: ALT 23; BUN 22; Creatinine, Ser 0.77; Potassium 5.1; Sodium 139    Lipid Panel    Component Value Date/Time   CHOL 143 03/10/2018 0941   CHOL 129 10/04/2017 1139   TRIG 80.0 03/10/2018 0941   TRIG 96 03/12/2008   HDL 35.80 (L) 03/10/2018 0941   HDL 36 (L) 10/04/2017 1139   CHOLHDL 4 03/10/2018 0941   VLDL 16.0 03/10/2018 0941   LDLCALC 92 03/10/2018 0941   LDLCALC 76 10/04/2017 1139   LDLCALC 91 03/12/2008      Wt Readings from Last 3 Encounters:  08/22/18 182 lb (82.6 kg)  07/12/18 183 lb (83 kg)  04/04/18 178 lb (80.7 kg)       No flowsheet data found.    ASSESSMENT AND PLAN:  1.  Peripheral arterial disease: In spite of her drop in ABI, he reports no significant claudication.  I suspect worsening SFA disease.  He is known to have occlusion on the right side.  Recommend intensifying medical therapy.  2. Coronary artery disease involving native coronary arteries with other forms of angina: Status post RCA stent in June 2018.  No recent anginal symptoms.  Continue aspirin indefinitely and treatment of risk factors.    3. Hyperlipidemia: The patient has known history of intolerance to statins and currently able to take only small dose atorvastatin.  Most recent LDL was 92.  Given his extensive PAD and coronary artery disease, we need to be more aggressive and with this with a target LDL below 55.  I recommend treatment with a PCSK9 inhibitor and will refer him to the lipid clinic.  4. Essential hypertension: Blood pressure is well controlled on current medications.  5.  Mild aortic stenosis: This was stable on most recent echocardiogram last year.  Repeat study in 1 to 2 years.    Disposition:   FU with me in 6  months  Signed,  [name], MD  08/22/2018 11:17 AM    [place]

## 2018-08-27 ENCOUNTER — Other Ambulatory Visit: Payer: Self-pay | Admitting: Physician Assistant

## 2018-09-01 ENCOUNTER — Other Ambulatory Visit: Payer: Self-pay | Admitting: Physician Assistant

## 2018-09-07 ENCOUNTER — Telehealth: Payer: Self-pay

## 2018-09-07 NOTE — Telephone Encounter (Signed)
lmom for prescreen  

## 2018-09-11 ENCOUNTER — Other Ambulatory Visit: Payer: Self-pay

## 2018-09-11 ENCOUNTER — Ambulatory Visit (INDEPENDENT_AMBULATORY_CARE_PROVIDER_SITE_OTHER): Payer: Medicare Other | Admitting: Pharmacist

## 2018-09-11 ENCOUNTER — Encounter: Payer: Self-pay | Admitting: Adult Health

## 2018-09-11 ENCOUNTER — Ambulatory Visit: Payer: Medicare Other | Admitting: Adult Health

## 2018-09-11 VITALS — BP 126/58 | HR 65

## 2018-09-11 VITALS — BP 122/72 | HR 66 | Ht 67.0 in | Wt 182.0 lb

## 2018-09-11 DIAGNOSIS — I739 Peripheral vascular disease, unspecified: Secondary | ICD-10-CM | POA: Diagnosis not present

## 2018-09-11 DIAGNOSIS — Z9989 Dependence on other enabling machines and devices: Secondary | ICD-10-CM | POA: Diagnosis not present

## 2018-09-11 DIAGNOSIS — G4733 Obstructive sleep apnea (adult) (pediatric): Secondary | ICD-10-CM

## 2018-09-11 DIAGNOSIS — J310 Chronic rhinitis: Secondary | ICD-10-CM | POA: Diagnosis not present

## 2018-09-11 MED ORDER — ATORVASTATIN CALCIUM 10 MG PO TABS: 10.0000 mg | ORAL_TABLET | ORAL | 1 refills | Status: DC

## 2018-09-11 NOTE — Assessment & Plan Note (Signed)
LDL remains above goal for secondary [prevcention. Patient intolerant to simvastatin and atorvastatin 10mg . Currently following low fat and low carbohydrate diet and taking atorvastatin 5mg  daily.  Good candidate for PCSK9i therapy.  Repatha Seureclick 140mg  inidcation, common side effects, PA process, storage, administration and financial assistance discussed during this visit.  Patient agrees on therapy. 1st dose of Repatha provided as sample for in office administration and teaching (lot [id number] exp 10/22).   Will process paperwork for PA approval, call patient for follow up and repeat fasting lipid panel 2 -3 months after initiating therapy.

## 2018-09-11 NOTE — Patient Instructions (Signed)
Lipid Clinic (pharmacist)  *START prior authorization for Repatha 140mg  every 14 days* - pharmacist  *1st dose given in office on July/20th, next dose due August 3th* *Take atorvastatin 10mg  every Monday - next dose July 27*  . Cholesterol Content in Foods Cholesterol is a waxy, fat-like substance that helps to carry fat in the blood. The body needs cholesterol in small amounts, but too much cholesterol can cause damage to the arteries and heart. Most people should eat less than 200 milligrams (mg) of cholesterol a day. Foods with cholesterol  Cholesterol is found in animal-based foods, such as meat, seafood, and dairy. Generally, low-fat dairy and lean meats have less cholesterol than full-fat dairy and fatty meats. The milligrams of cholesterol per serving (mg per serving) of common cholesterol-containing foods are listed below. Meat and other proteins  Egg - one large whole egg has 186 mg.  Veal shank - 4 oz has 141 mg.  Lean ground [place] (93% lean) - 4 oz has 118 mg.  Fat-trimmed lamb loin - 4 oz has 106 mg.  Lean ground beef (90% lean) - 4 oz has 100 mg.  Lobster - 3.5 oz has 90 mg.  Pork loin chops - 4 oz has 86 mg.  Canned salmon - 3.5 oz has 83 mg.  Fat-trimmed beef top loin - 4 oz has 78 mg.  Frankfurter - 1 frank (3.5 oz) has 77 mg.  Crab - 3.5 oz has 71 mg.  Roasted chicken without skin, white meat - 4 oz has 66 mg.  Light bologna - 2 oz has 45 mg.  Deli-cut [place] - 2 oz has 31 mg.  Canned tuna - 3.5 oz has 31 mg.  Bacon - 1 oz has 29 mg.  Oysters and mussels (raw) - 3.5 oz has 25 mg.  Mackerel - 1 oz has 22 mg.  Trout - 1 oz has 20 mg.  Pork sausage - 1 link (1 oz) has 17 mg.  Salmon - 1 oz has 16 mg.  Tilapia - 1 oz has 14 mg. Dairy  Soft-serve ice cream -  cup (4 oz) has 103 mg.  Whole-milk yogurt - 1 cup (8 oz) has 29 mg.  Cheddar cheese - 1 oz has 28 mg.  American cheese - 1 oz has 28 mg.  Whole milk - 1 cup (8 oz) has 23 mg.   2% milk - 1 cup (8 oz) has 18 mg.  Cream cheese - 1 tablespoon (Tbsp) has 15 mg.  Cottage cheese -  cup (4 oz) has 14 mg.  Low-fat (1%) milk - 1 cup (8 oz) has 10 mg.  Sour cream - 1 Tbsp has 8.5 mg.  Low-fat yogurt - 1 cup (8 oz) has 8 mg.  Nonfat Greek yogurt - 1 cup (8 oz) has 7 mg.  Half-and-half cream - 1 Tbsp has 5 mg. Fats and oils  Cod liver oil - 1 tablespoon (Tbsp) has 82 mg.  Butter - 1 Tbsp has 15 mg.  Lard - 1 Tbsp has 14 mg.  Bacon grease - 1 Tbsp has 14 mg.  Mayonnaise - 1 Tbsp has 5-10 mg.  Margarine - 1 Tbsp has 3-10 mg. Exact amounts of cholesterol in these foods may vary depending on specific ingredients and brands. Foods without cholesterol Most plant-based foods do not have cholesterol unless you combine them with a food that has cholesterol. Foods without cholesterol include:  Grains and cereals.  Vegetables.  Fruits.  Vegetable oils, such as olive, canola,  and sunflower oil.  Legumes, such as peas, beans, and lentils.  Nuts and seeds.  Egg whites. Summary  The body needs cholesterol in small amounts, but too much cholesterol can cause damage to the arteries and heart.  Most people should eat less than 200 milligrams (mg) of cholesterol a day. This information is not intended to replace advice given to you by your health care provider. Make sure you discuss any questions you have with your health care provider. Document Released: 10/05/2016 Document Revised: 01/21/2017 Document Reviewed: 10/05/2016 Elsevier Patient Education  2020 [employer].

## 2018-09-11 NOTE — Assessment & Plan Note (Signed)
Moderate sleep apnea.  Patient education on the importance of CPAP compliance and potential complications of untreated sleep apnea.  Will adjust CPAP pressure for comfort.  Also trial of dream wear CPAP mask. Advised to try melatonin while he was wearing his CPAP to see if this helps with his insomnia.  Also may need to look further at his restless leg if this continues to be an issue.  However his restless leg may be a result of his untreated sleep apnea. If unable to obtain this.  Consider a CPAP titration study on return Add Flonase and saline nasal spray to help with nasal congestion.  Snore strips for comfort.  Plan  Patient Instructions  Continue on CPAP At bedtime  .  Wear CPAP each night for at least 4-6 hr .  Work on healthy weight.  Do not drive if sleepy.  Try Dream Wear full face.  Adjust CPAP pressure 10 to 16 cm H2O .  Saline nasal spray and gel.  Try Flonase 2 puff At bedtime   Continue on Snore strip at night .  Follow up with Dr. Ander Slade or Ruxin Ransome NP in 3  months and As needed

## 2018-09-11 NOTE — Progress Notes (Signed)
@Patient  ID: Marcus Norton, male    DOB: 1940/04/24, 78 y.o.   MRN: [id number]  Chief Complaint  Patient presents with   Follow-up    osa     Referring provider: Delorse Limber  HPI: 78 year old male seen for sleep consult December 2019 for obstructive sleep apnea Medical history significant for hypertension and diabetes, restless leg syndrome, coronary artery disease    TEST/EVENTS :  HST 12/2017 AHI 28/hr , O2 sats low 79%    09/11/2018 Follow up : OSA  Patient returns for a 88-month follow-up.  Patient was seen for a sleep consult in December 2019.  He underwent a sleep study in November 2019 that showed moderate sleep apnea with significant desaturations at 79% AHI 28/hour.  Patient was seen last visit and discussed treatment options.  Patient was started on nocturnal CPAP.  Patient has underlying medical history of diabetes and hypertension.  And peripheral artery disease.  She returns today and says that he has been having trouble wearing his CPAP.  Download shows 60% compliance with daily average usage around 59 minutes.  Patient is on auto CPAP 5 to 15 cm H2O.  AHI 30 We discussed the importance of CPAP compliance.  Patient does have some daytime sleepiness.  Really wants to be successful at wearing CPAP feels that he has some stuffy nose and is hard for him to breathe at times.  And therefore cannot wear his mask.  Also feels the pressure is not strong enough at times.  Also says his mass that he currently has a lot of times comes apart while he is trying to wear it.  We went over a DreamWear full face sample mask and he seems to like this and wants to try it.  Patient says restless leg symptoms seem to be interfering with his sleep also has some insomnia.  Has tried restless leg medications in the past but was intolerant.  Has tried melatonin in the past without much results.  Allergies  Allergen Reactions   Plavix [Clopidogrel Bisulfate] Shortness Of Breath    Oxycontin [Oxycodone Hcl] Other (See Comments)    Malaise   Penicillins Other (See Comments)    [place] Has patient had a PCN reaction causing immediate rash, facial/tongue/throat swelling, SOB or lightheadedness with hypotension: No Has patient had a PCN reaction causing severe rash involving mucus membranes or skin necrosis: No Has patient had a PCN reaction that required hospitalization No Has patient had a PCN reaction occurring within the last 10 years: No If all of the above answers are "NO", then may proceed with Cephalosporin use.    Pioglitazone Other (See Comments)    Made his chest hurt   Prednisone Other (See Comments)    Reaction: muscle aches and soreness   Simvastatin Other (See Comments)    Myalgias    Immunization History  Administered Date(s) Administered   Influenza, High Dose Seasonal PF 11/03/2015, 11/17/2017   Influenza,inj,Quad PF,6+ Mos 11/20/2016   Influenza,inj,quad, With Preservative 11/22/2016   Influenza-Unspecified 11/20/2016   Pneumococcal Conjugate-13 03/04/2016   Pneumococcal Polysaccharide-23 03/08/2017   Td 03/11/2011   Zoster Recombinat (Shingrix) 05/01/2016, 10/05/2016    Past Medical History:  Diagnosis Date   Adenomatous colon polyp 2000/2010   Arthritis    "lower back" (07/29/2016)   ASCVD (arteriosclerotic cardiovascular disease)    -luminal irregularities in 1998 and 2005; normal EF   Benign prostatic hypertrophy    status post transurethral resection of the prostate  CAD (coronary artery disease)    a. Cath 07/29/16 99% ostial RCA & 70% pRCA s/p cutting balloon angioplasty and single SYNERGY DES 2.5X28. 100% very Distal 1st RPLB lesion --> the occlusion site moved further distal with guidewire advancement;  40% ostial LAD; 50% Ost Cx to Prox Cx lesion.   Coronary artery disease    Dysphagia    Erectile dysfunction    GERD (gastroesophageal reflux disease)    Heart murmur    History of hiatal hernia     History of kidney stones    Hyperlipidemia    Hypertension    Seasonal allergies    Tobacco abuse    20 pack years; discontinued 1995   Type II diabetes mellitus ([place])     Tobacco History: Social History   Tobacco Use  Smoking Status Former Smoker   Packs/day: 1.00   Years: 15.00   Pack years: 15.00   Types: Cigarettes   Start date: 03/08/1965   Quit date: 07/07/1993   Years since quitting: 25.1  Smokeless Tobacco Never Used   Counseling given: Not Answered   Outpatient Medications Prior to Visit  Medication Sig Dispense Refill   ACCU-CHEK AVIVA PLUS test strip USE TO TEST BLOOD SUAGR TWICE DAILY 200 each 2   albuterol (PROVENTIL HFA;VENTOLIN HFA) 108 (90 Base) MCG/ACT inhaler Inhale 2 puffs into the lungs every 6 (six) hours as needed for wheezing or shortness of breath. (Patient not taking: Reported on 07/12/2018) 1 Inhaler 0   alfuzosin (UROXATRAL) 10 MG 24 hr tablet Take 10 mg by mouth daily with breakfast.     amLODipine (NORVASC) 5 MG tablet TAKE 1 TABLET(5 MG) BY MOUTH DAILY 90 tablet 1   aspirin 325 MG EC tablet Take 325 mg by mouth daily.      atorvastatin (LIPITOR) 10 MG tablet Take 1 tablet (10 mg total) by mouth once a week. 90 tablet 1   azelastine (ASTELIN) 0.1 % nasal spray Place 2 sprays into both nostrils 2 (two) times daily. Use in each nostril as directed (Patient not taking: Reported on 07/12/2018) 30 mL 12   docusate sodium (COLACE) 100 MG capsule Take 100 mg by mouth as needed for mild constipation.      Insulin Pen Needle (B-D ULTRAFINE III SHORT PEN) 31G X 8 MM MISC Inject insulin SQ daily Dx:E11.8 100 each 3   JANUVIA 100 MG tablet TAKE 1 TABLET(100 MG) BY MOUTH DAILY 90 tablet 1   LANTUS SOLOSTAR 100 UNIT/ML Solostar Pen Inject 22 Units into the skin daily. Increase by 24 units every 3 days 15 mL 3   losartan (COZAAR) 25 MG tablet TAKE 1 TABLET(25 MG) BY MOUTH DAILY 90 tablet 1   metFORMIN (GLUCOPHAGE) 1000 MG tablet TAKE 1 TABLET  BY MOUTH TWICE DAILY WITH MEALS 180 tablet 1   metoprolol tartrate (LOPRESSOR) 25 MG tablet TAKE 1 TABLET(25 MG) BY MOUTH TWICE DAILY 180 tablet 1   MULTIPLE VITAMIN PO Take 1 tablet by mouth daily.     nitroGLYCERIN (NITROSTAT) 0.4 MG SL tablet Place 1 tablet (0.4 mg total) under the tongue every 5 (five) minutes as needed for chest pain. 5 tablet 0   pantoprazole (PROTONIX) 40 MG tablet TAKE 1 TABLET(40 MG) BY MOUTH DAILY 90 tablet 1   tadalafil (CIALIS) 20 MG tablet Take 0.5-1 tablets (10-20 mg total) by mouth every other day as needed for erectile dysfunction. 5 tablet 1   No facility-administered medications prior to visit.      Review  of Systems:   Constitutional:   No  weight loss, night sweats,  Fevers, chills,  +fatigue, or  lassitude.  HEENT:   No headaches,  Difficulty swallowing,  Tooth/dental problems, or  Sore throat,                No sneezing, itching, ear ache,  +nasal congestion, post nasal drip,   CV:  No chest pain,  Orthopnea, PND, swelling in lower extremities, anasarca, dizziness, palpitations, syncope.   GI  No heartburn, indigestion, abdominal pain, nausea, vomiting, diarrhea, change in bowel habits, loss of appetite, bloody stools.   Resp: No shortness of breath with exertion or at rest.  No excess mucus, no productive cough,  No non-productive cough,  No coughing up of blood.  No change in color of mucus.  No wheezing.  No chest wall deformity  Skin: no rash or lesions.  GU: no dysuria, change in color of urine, no urgency or frequency.  No flank pain, no hematuria   MS:  No joint pain or swelling.  No decreased range of motion.  No back pain.    Physical Exam  BP 122/72    Pulse 66    Ht 5\' 7"  (1.702 m)    Wt 182 lb (82.6 kg)    SpO2 95%    BMI 28.51 kg/m   GEN: A/Ox3; pleasant , NAD, elderly    HEENT:  [place]/AT,  , NOSE-clear, THROAT-clear, no lesions, no postnasal drip or exudate noted. Class 2-3 MP airway   NECK:  Supple w/ fair ROM; no JVD;  normal carotid impulses w/o bruits; no thyromegaly or nodules palpated; no lymphadenopathy.    RESP  Clear  P & A; w/o, wheezes/ rales/ or rhonchi. no accessory muscle use, no dullness to percussion  CARD:  RRR, no m/r/g, no peripheral edema, pulses intact, no cyanosis or clubbing.  GI:   Soft & nt; nml bowel sounds; no organomegaly or masses detected.   Musco: Warm bil, no deformities or joint swelling noted.   Neuro: alert, no focal deficits noted.    Skin: Warm, no lesions or rashes    Lab Results:  CBC  No results found for: BNP  ProBNP No results found for: PROBNP  Imaging: No results found.    No flowsheet data found.  No results found for: NITRICOXIDE      Assessment & Plan:   OSA (obstructive sleep apnea) Moderate sleep apnea.  Patient education on the importance of CPAP compliance and potential complications of untreated sleep apnea.  Will adjust CPAP pressure for comfort.  Also trial of dream wear CPAP mask. Advised to try melatonin while he was wearing his CPAP to see if this helps with his insomnia.  Also may need to look further at his restless leg if this continues to be an issue.  However his restless leg may be a result of his untreated sleep apnea. If unable to obtain this.  Consider a CPAP titration study on return Add Flonase and saline nasal spray to help with nasal congestion.  Snore strips for comfort.  Plan  Patient Instructions  Continue on CPAP At bedtime  .  Wear CPAP each night for at least 4-6 hr .  Work on healthy weight.  Do not drive if sleepy.  Try Dream Wear full face.  Adjust CPAP pressure 10 to 16 cm H2O .  Saline nasal spray and gel.  Try Flonase 2 puff At bedtime   Continue on Snore strip at  night .  Follow up with Dr. Ander Slade or Yale Golla NP in 3  months and As needed         Chronic rhinitis Add Flonase and saline nasal spray     Rexene Edison, NP 09/11/2018

## 2018-09-11 NOTE — Patient Instructions (Addendum)
Continue on CPAP At bedtime  .  Wear CPAP each night for at least 4-6 hr .  Work on healthy weight.  Do not drive if sleepy.  Try Dream Wear full face.  Adjust CPAP pressure 10 to 16 cm H2O .  Saline nasal spray and gel.  Try Flonase 2 puff At bedtime   Continue on Snore strip at night .  Follow up with Dr. Ander Slade or [place] NP in 3  months and As needed

## 2018-09-11 NOTE — Progress Notes (Signed)
Patient ID: Marcus Norton                 DOB: 1940-12-26                    MRN: [id number]     HPI: Marcus Norton is a 78 y.o. male patient referred to lipid clinic by Dr Fletcher Anon. PMH is significant for claudication, PAD, ASCVD, s/piliac artery stent placement, DM-11, hypertension, and hyperlipidemia. Patient compliant with medication regimen and diet.  Patient presents for potential PCSK9i initiation and teaching.  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 His smoking use included cigarettes. He started smoking about 53 years ago. He has a 15.00 pack-year smoking history. He has never used smokeless tobacco. He reports that he does not drink alcohol or use drugs.   Labs: Jan/17/2020:  CHO 143; TG 80; HDL 35; LDL 92 - atorvastatin mg daily  Past Medical History:  Diagnosis Date  . 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 Arthritis    "lower back" (07/29/2016)  . ASCVD (arteriosclerotic cardiovascular disease)    -luminal irregularities in 1998 and 2005; normal EF  . Benign prostatic hypertrophy    status post transurethral resection of the prostate  . CAD (coronary artery disease)    a. Cath 07/29/16 99% ostial RCA & 70% pRCA s/p cutting balloon angioplasty and single SYNERGY DES 2.5X28. 100% very Distal 1st RPLB lesion --> the occlusion site moved further distal with guidewire advancement;  40% ostial LAD; 50% Ost Cx to Prox Cx lesion.  . Coronary artery disease   . Dysphagia   . Erectile dysfunction   .  GERD (gastroesophageal reflux disease)   . Heart murmur   . History of hiatal hernia   . History of kidney stones   . Hyperlipidemia   . Hypertension   . Seasonal allergies   . Tobacco abuse    20 pack years; discontinued 1995  . Type II diabetes mellitus ([place])     Current Outpatient Medications on File Prior to Visit  Medication Sig Dispense Refill  . ACCU-CHEK AVIVA PLUS test strip USE TO TEST BLOOD SUAGR TWICE DAILY 200 each 2  . albuterol (PROVENTIL HFA;VENTOLIN HFA) 108 (90 Base) MCG/ACT inhaler Inhale 2 puffs into the lungs every 6 (six) hours as needed for wheezing or shortness of breath. (Patient not taking: Reported on 07/12/2018) 1 Inhaler 0  . alfuzosin (UROXATRAL) 10 MG 24 hr tablet Take 10 mg by mouth daily with breakfast.    . amLODipine (NORVASC) 5 MG tablet TAKE 1 TABLET(5 MG) BY MOUTH DAILY 90 tablet 1  . aspirin 325 MG EC tablet Take 325 mg by mouth daily.     [name] atorvastatin (LIPITOR) 10 MG tablet TAKE 1 TABLET(10 MG) BY MOUTH DAILY 90 tablet 1  . azelastine (ASTELIN) 0.1 % nasal spray Place 2 sprays into both nostrils 2 (two) times daily. Use in each nostril as directed (Patient not taking: Reported on 07/12/2018) 30 mL 12  .  docusate sodium (COLACE) 100 MG capsule Take 100 mg by mouth as needed for mild constipation.     . Insulin Pen Needle (B-D ULTRAFINE III SHORT PEN) 31G X 8 MM MISC Inject insulin SQ daily Dx:E11.8 100 each 3  . JANUVIA 100 MG tablet TAKE 1 TABLET(100 MG) BY MOUTH DAILY 90 tablet 1  . LANTUS SOLOSTAR 100 UNIT/ML Solostar Pen Inject 22 Units into the skin daily. Increase by 24 units every 3 days 15 mL 3  . losartan (COZAAR) 25 MG tablet TAKE 1 TABLET(25 MG) BY MOUTH DAILY 90 tablet 1  . metFORMIN (GLUCOPHAGE) 1000 MG tablet TAKE 1 TABLET BY MOUTH TWICE DAILY WITH MEALS 180 tablet 1  . metoprolol tartrate (LOPRESSOR) 25 MG tablet TAKE 1 TABLET(25 MG) BY MOUTH TWICE DAILY 180 tablet 1  . MULTIPLE VITAMIN PO Take 1 tablet by mouth daily.    .  nitroGLYCERIN (NITROSTAT) 0.4 MG SL tablet Place 1 tablet (0.4 mg total) under the tongue every 5 (five) minutes as needed for chest pain. 5 tablet 0  . pantoprazole (PROTONIX) 40 MG tablet TAKE 1 TABLET(40 MG) BY MOUTH DAILY 90 tablet 1  . tadalafil (CIALIS) 20 MG tablet Take 0.5-1 tablets (10-20 mg total) by mouth every other day as needed for erectile dysfunction. 5 tablet 1   No current facility-administered medications on file prior to visit.     Allergies  Allergen Reactions  . Plavix [Clopidogrel Bisulfate] Shortness Of Breath  . Oxycontin [Oxycodone Hcl] Other (See Comments)    Malaise  . Penicillins Other (See Comments)    [place] Has patient had a PCN reaction causing immediate rash, facial/tongue/throat swelling, SOB or lightheadedness with hypotension: No Has patient had a PCN reaction causing severe rash involving mucus membranes or skin necrosis: No Has patient had a PCN reaction that required hospitalization No Has patient had a PCN reaction occurring within the last 10 years: No If all of the above answers are "NO", then may proceed with Cephalosporin use.   . Pioglitazone Other (See Comments)    Made his chest hurt  . Prednisone Other (See Comments)    Reaction: muscle aches and soreness  . Simvastatin Other (See Comments)    Myalgias    PAD (peripheral artery disease) ([place]) LDL remains above goal for secondary [prevcention. Patient intolerant to simvastatin and atorvastatin 10mg . Currently following low fat and low carbohydrate diet and taking atorvastatin 5mg  daily.  Good candidate for PCSK9i therapy.  Repatha Seureclick 140mg  inidcation, common side effects, PA process, storage, administration and financial assistance discussed during this visit.  Patient agrees on therapy. 1st dose of Repatha provided as sample for in office administration and teaching (lot [id number] exp 10/22).   Will process paperwork for PA approval, call patient for follow up and repeat  fasting lipid panel 2 -3 months after initiating therapy.    Tayllor Breitenstein Rodriguez-Guzman PharmD, BCPS, [place] [id number] 09/11/2018 1:04 PM

## 2018-09-11 NOTE — Assessment & Plan Note (Signed)
Add Flonase and saline nasal spray

## 2018-09-19 ENCOUNTER — Telehealth: Payer: Self-pay | Admitting: Pharmacist Clinician (PhC)/ Clinical Pharmacy Specialist

## 2018-09-19 MED ORDER — REPATHA SURECLICK 140 MG/ML ~~LOC~~ SOAJ: 140.0000 mg | SUBCUTANEOUS | 12 refills | Status: DC

## 2018-09-19 NOTE — Telephone Encounter (Signed)
Repatha approved to 03/18/2019. Rx sent to [facility]

## 2018-11-10 ENCOUNTER — Other Ambulatory Visit: Payer: Self-pay | Admitting: Cardiovascular Disease

## 2018-11-16 ENCOUNTER — Ambulatory Visit: Payer: Medicare Other | Admitting: Physician Assistant

## 2018-11-16 ENCOUNTER — Encounter: Payer: Self-pay | Admitting: Physician Assistant

## 2018-11-16 ENCOUNTER — Other Ambulatory Visit: Payer: Self-pay

## 2018-11-16 VITALS — BP 130/60 | HR 58 | Temp 97.7°F | Resp 14 | Ht 67.0 in | Wt 180.0 lb

## 2018-11-16 DIAGNOSIS — Z23 Encounter for immunization: Secondary | ICD-10-CM

## 2018-11-16 DIAGNOSIS — E782 Mixed hyperlipidemia: Secondary | ICD-10-CM

## 2018-11-16 DIAGNOSIS — I2510 Atherosclerotic heart disease of native coronary artery without angina pectoris: Secondary | ICD-10-CM

## 2018-11-16 DIAGNOSIS — I10 Essential (primary) hypertension: Secondary | ICD-10-CM

## 2018-11-16 DIAGNOSIS — Z794 Long term (current) use of insulin: Secondary | ICD-10-CM | POA: Diagnosis not present

## 2018-11-16 DIAGNOSIS — E118 Type 2 diabetes mellitus with unspecified complications: Secondary | ICD-10-CM | POA: Diagnosis not present

## 2018-11-16 DIAGNOSIS — J310 Chronic rhinitis: Secondary | ICD-10-CM

## 2018-11-16 DIAGNOSIS — IMO0001 Reserved for inherently not codable concepts without codable children: Secondary | ICD-10-CM

## 2018-11-16 LAB — BASIC METABOLIC PANEL
BUN: 24 mg/dL — ABNORMAL HIGH (ref 6–23)
CO2: 30 mEq/L (ref 19–32)
Calcium: 9.5 mg/dL (ref 8.4–10.5)
Chloride: 100 mEq/L (ref 96–112)
Creatinine, Ser: 0.76 mg/dL (ref 0.40–1.50)
GFR: 98.76 mL/min (ref >60.00)
Glucose, Bld: 157 mg/dL — ABNORMAL HIGH (ref 70–99)
Potassium: 4.8 mEq/L (ref 3.5–5.1)
Sodium: 137 mEq/L (ref 135–145)

## 2018-11-16 LAB — CBC WITH DIFFERENTIAL/PLATELET
Basophils Absolute: 0.1 10*3/uL (ref 0.0–0.1)
Basophils Relative: 1.8 % (ref 0.0–3.0)
Eosinophils Absolute: 0.3 10*3/uL (ref 0.0–0.7)
Eosinophils Relative: 4.6 % (ref 0.0–5.0)
HCT: 41.7 % (ref 39.0–52.0)
Hemoglobin: 14.1 g/dL (ref 13.0–17.0)
Lymphocytes Relative: 17.4 % (ref 12.0–46.0)
Lymphs Abs: 1.3 10*3/uL (ref 0.7–4.0)
MCHC: 33.7 g/dL (ref 30.0–36.0)
MCV: 101.6 fl — ABNORMAL HIGH (ref 78.0–100.0)
Monocytes Absolute: 0.4 10*3/uL (ref 0.1–1.0)
Monocytes Relative: 5.5 % (ref 3.0–12.0)
Neutro Abs: 5.3 10*3/uL (ref 1.4–7.7)
Neutrophils Relative %: 70.7 % (ref 43.0–77.0)
Platelets: 259 10*3/uL (ref 150.0–400.0)
RBC: 4.11 Mil/uL — ABNORMAL LOW (ref 4.22–5.81)
RDW: 13.6 % (ref 11.5–15.5)
WBC: 7.5 10*3/uL (ref 4.0–10.5)

## 2018-11-16 LAB — HEMOGLOBIN A1C: Hgb A1c MFr Bld: 7.9 % — ABNORMAL HIGH (ref 4.6–6.5)

## 2018-11-16 NOTE — Patient Instructions (Signed)
Please go to the lab today for blood work.  I will call you with your results. We will alter treatment regimen(s) if indicated by your results.   Continue medications as directed for now.  Get your eyes checked as directed. If you decide to change providers let me know so I can place a referral.   You will be contacted by ENT for assessment.  Follow-up with specialists as scheduled.

## 2018-11-16 NOTE — Progress Notes (Signed)
History of Present Illness: Patient is a 78 y.o. male who presents to clinic today for follow-up of Diabetes Mellitus II, uncontrolled with history of diabetic retinopathy.  Patient currently on medication regimen of Metformin 1000 mg BID, Januvia 100 mg QD and Lantus 24 units daily.  Endorses taking medications as directed.Marcus Norton checking blood glucose as directed. Fasting glucose averaging 140s.   Latest Maintenance: A1C --  Lab Results  Component Value Date   HGBA1C 8.2 (H) 07/03/2018   Diabetic Eye Exam -- Overdue for follow-up. + retinopathy.  Urine Microalbumin -- On ARB Foot Exam -- Due. Denies concerns today.   Past Medical History:  Diagnosis Date  . 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 Arthritis    "lower back" (07/29/2016)  . ASCVD (arteriosclerotic cardiovascular disease)    -luminal irregularities in 1998 and 2005; normal EF  . Benign prostatic hypertrophy    status post transurethral resection of the prostate  . CAD (coronary artery disease)    a. Cath 07/29/16 99% ostial RCA & 70% pRCA s/p cutting balloon angioplasty and single SYNERGY DES 2.5X28. 100% very Distal 1st RPLB lesion --> the occlusion site moved further distal with guidewire advancement;  40% ostial LAD; 50% Ost Cx to Prox Cx lesion.  . Coronary artery disease   . Dysphagia   . Erectile dysfunction   . GERD (gastroesophageal reflux disease)   . Heart murmur   . History of hiatal hernia   . History of kidney stones   . Hyperlipidemia   . Hypertension   . Seasonal allergies   . Tobacco abuse    20 pack years; discontinued 1995  . Type II diabetes mellitus ([place])     Current Outpatient Medications on File Prior to Visit  Medication Sig Dispense Refill  . ACCU-CHEK AVIVA PLUS test strip USE TO TEST BLOOD SUAGR TWICE DAILY 200 each 2  . alfuzosin (UROXATRAL) 10 MG 24 hr tablet Take 10 mg by mouth daily with breakfast.    . amLODipine (NORVASC) 5 MG tablet TAKE 1 TABLET(5 MG) BY MOUTH DAILY 90  tablet 1  . aspirin 325 MG EC tablet Take 325 mg by mouth daily.     [name] atorvastatin (LIPITOR) 10 MG tablet Take 1 tablet (10 mg total) by mouth once a week. (Patient taking differently: Take 10 mg by mouth every 14 (fourteen) days. ) 90 tablet 1  . docusate sodium (COLACE) 100 MG capsule Take 100 mg by mouth as needed for mild constipation.     . Evolocumab (REPATHA SURECLICK) XX123456 MG/ML SOAJ Inject 140 mg into the skin every 14 (fourteen) days. 2 pen 12  . Insulin Pen Needle (B-D ULTRAFINE III SHORT PEN) 31G X 8 MM MISC Inject insulin SQ daily Dx:E11.8 100 each 3  . JANUVIA 100 MG tablet TAKE 1 TABLET(100 MG) BY MOUTH DAILY 90 tablet 1  . LANTUS SOLOSTAR 100 UNIT/ML Solostar Pen Inject 22 Units into the skin daily. Increase by 24 units every 3 days (Patient taking differently: Inject into the skin daily. Increase by 24 units every 3 days) 15 mL 3  . losartan (COZAAR) 25 MG tablet TAKE 1 TABLET(25 MG) BY MOUTH DAILY 90 tablet 1  . metFORMIN (GLUCOPHAGE) 1000 MG tablet TAKE 1 TABLET BY MOUTH TWICE DAILY WITH MEALS 180 tablet 1  . metoprolol tartrate (LOPRESSOR) 25 MG tablet TAKE 1 TABLET(25 MG) BY MOUTH TWICE DAILY 180 tablet 3  . MULTIPLE VITAMIN PO Take 1 tablet by mouth daily.    [name]  nitroGLYCERIN (NITROSTAT) 0.4 MG SL tablet Place 1 tablet (0.4 mg total) under the tongue every 5 (five) minutes as needed for chest pain. 5 tablet 0  . pantoprazole (PROTONIX) 40 MG tablet TAKE 1 TABLET(40 MG) BY MOUTH DAILY 90 tablet 1  . tadalafil (CIALIS) 20 MG tablet Take 0.5-1 tablets (10-20 mg total) by mouth every other day as needed for erectile dysfunction. 5 tablet 1   No current facility-administered medications on file prior to visit.     Allergies  Allergen Reactions  . Plavix [Clopidogrel Bisulfate] Shortness Of Breath  . Oxycontin [Oxycodone Hcl] Other (See Comments)    Malaise  . Penicillins Other (See Comments)    [place] Has patient had a PCN reaction causing immediate rash,  facial/tongue/throat swelling, SOB or lightheadedness with hypotension: No Has patient had a PCN reaction causing severe rash involving mucus membranes or skin necrosis: No Has patient had a PCN reaction that required hospitalization No Has patient had a PCN reaction occurring within the last 10 years: No If all of the above answers are "NO", then may proceed with Cephalosporin use.   . Pioglitazone Other (See Comments)    Made his chest hurt  . Prednisone Other (See Comments)    Reaction: muscle aches and soreness  . Simvastatin Other (See Comments)    Myalgias    Family History  Problem Relation Age of Onset  . Ovarian cancer Mother   . Colon cancer Sister 64       deceased from colon cancer    Social History   Socioeconomic History  . Marital status: Married    Spouse name: Not on file  . Number of children: 2  . Years of education: Not on file  . Highest education level: Not on file  Occupational History  . Occupation: Dietitian, 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    Comment: Gaffer of agriculture  Social Needs  . Financial resource strain: Not on file  . Food insecurity    Worry: Not on file    Inability: Not on file  . Transportation needs    Medical: Not on file    Non-medical: Not on file  Tobacco Use  . Smoking status: Former Smoker    Packs/day: 1.00    Years: 15.00    Pack years: 15.00    Types: Cigarettes    Start date: 03/08/1965    Quit date: 07/07/1993    Years since quitting: 25.3  . Smokeless tobacco: Never Used  Substance and Sexual Activity  . Alcohol use: No    Alcohol/week: 0.0 standard drinks    Frequency: Never    Comment: 07/29/2016 "last drink was 4 wks ago; had a beer"  . Drug use: No  . Sexual activity: Not Currently  Lifestyle  . Physical activity    Days per week: Not on file    Minutes per session: Not on file  . Stress: Not on file  Relationships  . Social Herbalist on phone: Not on file    Gets together: Not on  file    Attends religious service: Not on file    Active member of club or organization: Not on file    Attends meetings of clubs or organizations: Not on file    Relationship status: Not on file  Other Topics Concern  . Not on file  Social History Narrative  . Not on file   Review of Systems: Pertinent ROS are listed in HPI  Physical Examination:  BP 130/60   Pulse (!) 58   Temp 97.7 F (36.5 C) (Skin)   Resp 14   Ht 5\' 7"  (1.702 m)   Wt 180 lb (81.6 kg)   SpO2 96%   BMI 28.19 kg/m  General appearance: alert, cooperative, appears stated age and no distress Head: Normocephalic, without obvious abnormality, atraumatic Lungs: clear to auscultation bilaterally Heart: regular rate and rhythm, S1, S2 normal, no murmur, click, rub or gallop Pulses: 2+ and symmetric Skin: Skin color, texture, turgor normal. No rashes or lesions Neurologic: Grossly normal  Diabetic Foot Exam - Simple   Simple Foot Form Diabetic Foot exam was performed with the following findings: Yes 11/16/2018  9:23 AM  Visual Inspection No deformities, no ulcerations, no other skin breakdown bilaterally: Yes Sensation Testing Intact to touch and monofilament testing bilaterally: Yes Pulse Check Posterior Tibialis and Dorsalis pulse intact bilaterally: Yes Comments Thick and longer toenails. Has appt with Podiatry for nail care.      Assessment/Plan: 1. Insulin dependent diabetes mellitus with complications ([place]) Foot exam updated today. Patient to call and schedule eye examination. Flu shot ([facility]) given. Reviewed dietary and exercise recommendations. Will repeat labs today and alter regimen accordingly.  - CBC with Differential/Platelet - Hemoglobin [id number] - Basic metabolic panel  2. Essential hypertension 3. Arteriosclerotic cardiovascular disease -nonobstructive 4. Mixed hyperlipidemia Managed by Cardiology. UTD on Lipids and LFTs. Tolerating Repatha well. Continue management per specialist.   5.  Chronic rhinitis Asked for referral at end of visit. Referral placed. - Ambulatory referral to ENT  6. Need for immunization against influenza - Flu Vaccine QUAD High Dose(Fluad)

## 2018-11-27 ENCOUNTER — Telehealth: Payer: Self-pay | Admitting: Physician Assistant

## 2018-11-27 NOTE — Telephone Encounter (Signed)
Much improved. Continue at current dose for now. Keep check on glucose to make sure is now staying stable. Keep a consistent diet. Follow-up as scheduled.

## 2018-11-27 NOTE — Telephone Encounter (Signed)
Patient called in to let Einar Pheasant know his blood sugar from over the weekend starting on October 2nd.  150,151,140,111,120, 104, 90, 106, 111,95 this morning it was 98.

## 2018-11-28 NOTE — Telephone Encounter (Signed)
Per DPR, LMOVM informing patient of PCP directions. Informed to call back with any questions or concerns.

## 2018-12-13 ENCOUNTER — Ambulatory Visit (INDEPENDENT_AMBULATORY_CARE_PROVIDER_SITE_OTHER): Payer: Medicare Other | Admitting: Adult Health

## 2018-12-13 ENCOUNTER — Other Ambulatory Visit: Payer: Self-pay

## 2018-12-13 ENCOUNTER — Encounter: Payer: Self-pay | Admitting: Adult Health

## 2018-12-13 DIAGNOSIS — G4733 Obstructive sleep apnea (adult) (pediatric): Secondary | ICD-10-CM | POA: Diagnosis not present

## 2018-12-13 NOTE — Assessment & Plan Note (Signed)
OSA -Moderate -unfortunately after multiple attempts and trials have patient was unable to tolerate CPAP.  Patient does have multiple risk factors including hypertension coronary artery disease and diabetes.. Discussed INSPIRE device .  Patient would like to think about it. Patient education given on obstructive sleep apnea and CPAP  Plan  Patient Instructions  Saline nasal spray and gel As needed   Continue on Snore strip at night .  Call back if you are interested in the INSPIRE implantable device , we can refer you to ENT , Dr. Wilburn Cornelia.  Follow up with Dr. Ander Slade or Corian Handley NP As needed

## 2018-12-13 NOTE — Progress Notes (Signed)
@Patient  ID: Marcus Norton, male    DOB: [birth year], 78 y.o.   MRN: [id number]  Chief Complaint  Patient presents with  . Follow-up    OSA    Referring provider: Delorse Limber  HPI: 78 year old male seen for sleep consult December 2019 for obstructive sleep apnea Medical history significant for hypertension and diabetes, restless leg syndrome, coronary artery disease, HTN, DM     TEST/EVENTS :  HST 12/2017 AHI 28/hr, O2 sats low 79%  12/13/2018 Follow up : OSA  Patient returns for a 64-month follow-up.  Patient has underlying moderate obstructive sleep apnea.  Was started on CPAP 1 year ago.  Has had troubles wearing CPAP.  Felt that the mask was not comfortable we tried several different mask.  He has tried multiple attempts over the last year of trying to wear this unfortunately was not successful.  His homecare company came pick this up not too long ago.  Due to noncompliance.  Patient says he tried his very best but just could not tolerate it.  Patient does have restless sleep with frequent awakenings.  Has some mild daytime sleepiness.  Says that he gets sleepy when he sits down.  Does take a nap on occasion. Has chronic nasal congestion and drainage.  Has recently started Flonase which seems to help some.   Allergies  Allergen Reactions  . Plavix [Clopidogrel Bisulfate] Shortness Of Breath  . Oxycontin [Oxycodone Hcl] Other (See Comments)    Malaise  . Penicillins Other (See Comments)    [place] Has patient had a PCN reaction causing immediate rash, facial/tongue/throat swelling, SOB or lightheadedness with hypotension: No Has patient had a PCN reaction causing severe rash involving mucus membranes or skin necrosis: No Has patient had a PCN reaction that required hospitalization No Has patient had a PCN reaction occurring within the last 10 years: No If all of the above answers are "NO", then may proceed with Cephalosporin use.   . Pioglitazone  Other (See Comments)    Made his chest hurt  . Prednisone Other (See Comments)    Reaction: muscle aches and soreness  . Simvastatin Other (See Comments)    Myalgias    Immunization History  Administered Date(s) Administered  . Fluad Quad(high Dose 65+) 11/16/2018  . Influenza, High Dose Seasonal PF 11/03/2015, 11/17/2017  . Influenza,inj,Quad PF,6+ Mos 11/20/2016  . Influenza,inj,quad, With Preservative 11/22/2016  . Influenza-Unspecified 11/20/2016  . Pneumococcal Conjugate-13 03/04/2016  . Pneumococcal Polysaccharide-23 03/08/2017  . Td 03/11/2011  . Zoster Recombinat (Shingrix) 05/01/2016, 10/05/2016    Past Medical History:  Diagnosis Date  . 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 Arthritis    "lower back" (07/29/2016)  . ASCVD (arteriosclerotic cardiovascular disease)    -luminal irregularities in 1998 and 2005; normal EF  . Benign prostatic hypertrophy    status post transurethral resection of the prostate  . CAD (coronary artery disease)    a. Cath 07/29/16 99% ostial RCA & 70% pRCA s/p cutting balloon angioplasty and single SYNERGY DES 2.5X28. 100% very Distal 1st RPLB lesion --> the occlusion site moved further distal with guidewire advancement;  40% ostial LAD; 50% Ost Cx to Prox Cx lesion.  . Coronary artery disease   . Dysphagia   . Erectile dysfunction   . GERD (gastroesophageal reflux disease)   . Heart murmur   . History of hiatal hernia   . History of kidney stones   . Hyperlipidemia   . Hypertension   . Seasonal  allergies   . Tobacco abuse    20 pack years; discontinued 1995  . Type II diabetes mellitus ([facility])     Tobacco History: Social History   Tobacco Use  Smoking Status Former Smoker  . Packs/day: 1.00  . Years: 15.00  . Pack years: 15.00  . Types: Cigarettes  . Start date: 03/08/1965  . Quit date: 07/07/1993  . Years since quitting: 25.4  Smokeless Tobacco Never Used   Counseling given: Not Answered   Outpatient Medications Prior to Visit   Medication Sig Dispense Refill  . ACCU-CHEK AVIVA PLUS test strip USE TO TEST BLOOD SUAGR TWICE DAILY 200 each 2  . alfuzosin (UROXATRAL) 10 MG 24 hr tablet Take 10 mg by mouth daily with breakfast.    . amLODipine (NORVASC) 5 MG tablet TAKE 1 TABLET(5 MG) BY MOUTH DAILY 90 tablet 1  . aspirin EC 81 MG tablet Take 81 mg by mouth daily.    [name] atorvastatin (LIPITOR) 10 MG tablet Take 1 tablet (10 mg total) by mouth once a week. 90 tablet 1  . docusate sodium (COLACE) 100 MG capsule Take 100 mg by mouth as needed for mild constipation.     . Evolocumab (REPATHA SURECLICK) [id number] MG/ML SOAJ Inject 140 mg into the skin every 14 (fourteen) days. 2 pen 12  . Insulin Pen Needle (B-D ULTRAFINE III SHORT PEN) 31G X 8 MM MISC Inject insulin SQ daily Dx:E11.8 100 each 3  . JANUVIA 100 MG tablet TAKE 1 TABLET(100 MG) BY MOUTH DAILY 90 tablet 1  . LANTUS SOLOSTAR 100 UNIT/ML Solostar Pen Inject 22 Units into the skin daily. Increase by 24 units every 3 days (Patient taking differently: Inject into the skin daily. Increase by 26 units every day) 15 mL 3  . losartan (COZAAR) 25 MG tablet TAKE 1 TABLET(25 MG) BY MOUTH DAILY 90 tablet 1  . metFORMIN (GLUCOPHAGE) 1000 MG tablet TAKE 1 TABLET BY MOUTH TWICE DAILY WITH MEALS 180 tablet 1  . metoprolol tartrate (LOPRESSOR) 25 MG tablet TAKE 1 TABLET(25 MG) BY MOUTH TWICE DAILY 180 tablet 3  . MULTIPLE VITAMIN PO Take 1 tablet by mouth daily.    . nitroGLYCERIN (NITROSTAT) 0.4 MG SL tablet Place 1 tablet (0.4 mg total) under the tongue every 5 (five) minutes as needed for chest pain. 5 tablet 0  . pantoprazole (PROTONIX) 40 MG tablet TAKE 1 TABLET(40 MG) BY MOUTH DAILY 90 tablet 1  . tadalafil (CIALIS) 20 MG tablet Take 0.5-1 tablets (10-20 mg total) by mouth every other day as needed for erectile dysfunction. 5 tablet 1  . aspirin 325 MG EC tablet Take 325 mg by mouth daily.      No facility-administered medications prior to visit.      Review of Systems:    Constitutional:   No  weight loss, night sweats,  Fevers, chills, fatigue, or  lassitude.  HEENT:   No headaches,  Difficulty swallowing,  Tooth/dental problems, or  Sore throat,                No sneezing, itching, ear ache,  +nasal congestion, post nasal drip,   CV:  No chest pain,  Orthopnea, PND, swelling in lower extremities, anasarca, dizziness, palpitations, syncope.   GI  No heartburn, indigestion, abdominal pain, nausea, vomiting, diarrhea, change in bowel habits, loss of appetite, bloody stools.   Resp: No shortness of breath with exertion or at rest.  No excess mucus, no productive cough,  No non-productive cough,  No coughing up of blood.  No change in color of mucus.  No wheezing.  No chest wall deformity  Skin: no rash or lesions.  GU: no dysuria, change in color of urine, no urgency or frequency.  No flank pain, no hematuria   MS:  No joint pain or swelling.  No decreased range of motion.  No back pain.    Physical Exam  BP 134/68 (BP Location: Left Arm, Cuff Size: Normal)   Pulse 71   Temp 97.9 F (36.6 C) (Temporal)   Ht 5\' 7"  (1.702 m)   Wt 182 lb 3.2 oz (82.6 kg)   SpO2 97%   BMI 28.54 kg/m   GEN: A/Ox3; pleasant , NAD, well nourished    HEENT:  [place]/AT,   NOSE-clear, THROAT-clear, no lesions, no postnasal drip or exudate noted.  Class II-III MP airway  NECK:  Supple w/ fair ROM; no JVD; normal carotid impulses w/o bruits; no thyromegaly or nodules palpated; no lymphadenopathy.    RESP  Clear  P & A; w/o, wheezes/ rales/ or rhonchi. no accessory muscle use, no dullness to percussion  CARD:  RRR, no m/r/g, no peripheral edema, pulses intact, no cyanosis or clubbing.  GI:   Soft & nt; nml bowel sounds; no organomegaly or masses detected.   Musco: Warm bil, no deformities or joint swelling noted.   Neuro: alert, no focal deficits noted.    Skin: Warm, no lesions or rashes    Lab Results:  CBC    Component Value Date/Time   WBC 7.5 11/16/2018  0946   RBC 4.11 (L) 11/16/2018 0946   HGB 14.1 11/16/2018 0946   HCT 41.7 11/16/2018 0946   PLT 259.0 11/16/2018 0946   MCV 101.6 (H) 11/16/2018 0946   MCH 33.0 02/05/2018 0610   MCHC 33.7 11/16/2018 0946   RDW 13.6 11/16/2018 0946   LYMPHSABS 1.3 11/16/2018 0946   MONOABS 0.4 11/16/2018 0946   EOSABS 0.3 11/16/2018 0946   BASOSABS 0.1 11/16/2018 0946    BMET    Component Value Date/Time   NA 137 11/16/2018 0946   NA 140 12/15/2015   K 4.8 11/16/2018 0946   CL 100 11/16/2018 0946   CO2 30 11/16/2018 0946   GLUCOSE 157 (H) 11/16/2018 0946   BUN 24 (H) 11/16/2018 0946   BUN 26 (A) 12/15/2015   CREATININE 0.76 11/16/2018 0946   CREATININE 0.91 11/18/2015 1153   CALCIUM 9.5 11/16/2018 0946   GFRNONAA >60 02/05/2018 0610   GFRAA >60 02/05/2018 0610    BNP No results found for: BNP  ProBNP No results found for: PROBNP  Imaging: No results found.    No flowsheet data found.  No results found for: NITRICOXIDE      Assessment & Plan:   OSA (obstructive sleep apnea) OSA -Moderate -unfortunately after multiple attempts and trials have patient was unable to tolerate CPAP.  Patient does have multiple risk factors including hypertension coronary artery disease and diabetes.. Discussed INSPIRE device .  Patient would like to think about it. Patient education given on obstructive sleep apnea and CPAP  Plan  Patient Instructions  Saline nasal spray and gel As needed   Continue on Snore strip at night .  Call back if you are interested in the INSPIRE implantable device , we can refer you to ENT , Dr. Wilburn Cornelia.  Follow up with Dr. Ander Slade or Deroy Noah NP As needed            Rexene Edison, NP  12/13/2018  

## 2018-12-13 NOTE — Patient Instructions (Addendum)
Saline nasal spray and gel As needed   Continue on Snore strip at night .  Call back if you are interested in the INSPIRE implantable device , we can refer you to ENT , Dr. Wilburn Cornelia.  Follow up with Dr. Ander Slade or Jarquez Mestre NP As needed

## 2018-12-21 ENCOUNTER — Other Ambulatory Visit: Payer: Self-pay | Admitting: Physician Assistant

## 2018-12-26 ENCOUNTER — Other Ambulatory Visit: Payer: Self-pay | Admitting: Physician Assistant

## 2019-01-24 ENCOUNTER — Other Ambulatory Visit: Payer: Self-pay | Admitting: Physician Assistant

## 2019-01-24 DIAGNOSIS — K219 Gastro-esophageal reflux disease without esophagitis: Secondary | ICD-10-CM

## 2019-02-06 ENCOUNTER — Ambulatory Visit: Payer: Medicare Other | Admitting: Cardiovascular Disease

## 2019-02-06 ENCOUNTER — Other Ambulatory Visit: Payer: Self-pay

## 2019-02-06 ENCOUNTER — Encounter: Payer: Self-pay | Admitting: Cardiovascular Disease

## 2019-02-06 VITALS — BP 132/56 | HR 62 | Ht 67.0 in | Wt 182.8 lb

## 2019-02-06 DIAGNOSIS — E785 Hyperlipidemia, unspecified: Secondary | ICD-10-CM

## 2019-02-06 DIAGNOSIS — I2510 Atherosclerotic heart disease of native coronary artery without angina pectoris: Secondary | ICD-10-CM | POA: Diagnosis not present

## 2019-02-06 DIAGNOSIS — I739 Peripheral vascular disease, unspecified: Secondary | ICD-10-CM | POA: Diagnosis not present

## 2019-02-06 DIAGNOSIS — I350 Nonrheumatic aortic (valve) stenosis: Secondary | ICD-10-CM

## 2019-02-06 DIAGNOSIS — I10 Essential (primary) hypertension: Secondary | ICD-10-CM

## 2019-02-06 LAB — LIPID PANEL
Chol/HDL Ratio: 2 ratio (ref 0.0–5.0)
Cholesterol, Total: 75 mg/dL — ABNORMAL LOW (ref 100–199)
HDL: 37 mg/dL — ABNORMAL LOW (ref >39)
LDL Chol Calc (NIH): 24 mg/dL (ref 0–99)
Triglycerides: 60 mg/dL (ref 0–149)
VLDL Cholesterol Cal: 14 mg/dL (ref 5–40)

## 2019-02-06 LAB — HEPATIC FUNCTION PANEL
ALT: 27 IU/L (ref 0–44)
AST: 16 IU/L (ref 0–40)
Albumin: 4.3 g/dL (ref 3.7–4.7)
Alkaline Phosphatase: 64 IU/L (ref 39–117)
Bilirubin Total: 0.6 mg/dL (ref 0.0–1.2)
Bilirubin, Direct: 0.19 mg/dL (ref 0.00–0.40)
Total Protein: 6.7 g/dL (ref 6.0–8.5)

## 2019-02-06 NOTE — Patient Instructions (Signed)
Medication Instructions:  No changes *If you need a refill on your cardiac medications before your next appointment, please call your pharmacy*  Lab Work: Your provider would like for you to have the following labs today: Lipid and Liver  If you have labs (blood work) drawn today and your tests are completely normal, you will receive your results only by: [name] MyChart Message (if you have MyChart) OR . A paper copy in the mail If you have any lab test that is abnormal or we need to change your treatment, we will call you to review the results.  Testing/Procedures: None ordered  Follow-Up: At [facility], you and your health needs are our priority.  As part of our continuing mission to provide you with exceptional heart care, we have created designated Provider Care Teams.  These Care Teams include your primary Cardiologist (physician) and Advanced Practice Providers (APPs -  Physician Assistants and Nurse Practitioners) who all work together to provide you with the care you need, when you need it.  Your next appointment:   6 month(s)  The format for your next appointment:   In Person  Provider:   [name], MD

## 2019-02-06 NOTE — Progress Notes (Signed)
Cardiology Office Note   Date:  02/06/2019   ID:  Marcus, Norton 11-Apr-1940, MRN [id number]  PCP:  [name], PA-C  Cardiologist: Dr. Fletcher Anon  No chief complaint on file.     History of Present Illness: Marcus Norton is a 78 y.o. male who presents for a follow-up visit regarding claudication and PAD. He has known history of  paroxysmal supraventricular tachycardia,  coronary artery disease, mild to moderate aortic stenosis, type 2 diabetes since 1990, hypertension and hyperlipidemia. He has known history of claudication . He did not tolerate cilostazol due to palpitations. He is s/p bilateral common iliac artery kissing stent placement in 11/2015. 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   He had abnormal nuclear stress test in 2018.  Cardiac catheterization in June, 2018 showed severe ostial RCA stenosis and occluded RPL2 with nonobstructive disease affecting the left system.  He underwent successful angioplasty and drug-eluting stent placement to the ostial right coronary artery. He also has mild aortic stenosis. He had vascular studies done in June which showed a drop in ABI bilaterally with patent iliac stents.  He had no worsening claudication.  He has known history of intolerance to statins and was able to tolerate only small dose of atorvastatin.  His LDL continues to be above 70 and thus he was started on Repatha and has been tolerating the medication.  He reports no new symptoms today.    Past Medical History:  Diagnosis Date  . 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 Arthritis    "lower back" (07/29/2016)  . ASCVD (arteriosclerotic cardiovascular disease)    -luminal irregularities in 1998 and 2005; normal EF  . Benign prostatic hypertrophy    status post transurethral resection of the prostate  . CAD (coronary artery  disease)    a. Cath 07/29/16 99% ostial RCA & 70% pRCA s/p cutting balloon angioplasty and single SYNERGY DES 2.5X28. 100% very Distal 1st RPLB lesion --> the occlusion site moved further distal with guidewire advancement;  40% ostial LAD; 50% Ost Cx to Prox Cx lesion.  . Coronary artery disease   . Dysphagia   . Erectile dysfunction   . GERD (gastroesophageal reflux disease)   . Heart murmur   . History of hiatal hernia   . History of kidney stones   . Hyperlipidemia   . Hypertension   . Seasonal allergies   . Tobacco abuse    20 pack years; discontinued 1995  . Type II diabetes mellitus ([place])     Past Surgical History:  Procedure Laterality Date  . APPENDECTOMY    . CATARACT EXTRACTION W/ INTRAOCULAR LENS  IMPLANT, BILATERAL Bilateral   . COLONOSCOPY  07/20/2011   Dr. Gala Romney: multiple hyperplastic polyps. Surveillance 2018  . COLONOSCOPY N/A 06/30/2016   Procedure: COLONOSCOPY;  Surgeon: Daneil Dolin, MD;  Location: [facility];  Service: Endoscopy;  Laterality: N/A;  10:00am  . COLONOSCOPY W/ BIOPSIES AND POLYPECTOMY  07/08/08, 06/2011   Dr Madolyn Frieze papilla, diminutive rectal polyp ablated the, left-sided diverticula, piecemeal polypectomy of hepatic flexure adenomatous polyp, diminutive polyps near hepatic flexure ablated  . CORONARY ANGIOPLASTY WITH STENT PLACEMENT  07/29/2016  . CORONARY STENT INTERVENTION N/A 07/29/2016   Procedure: Coronary Stent Intervention;  Surgeon: Leonie Man, MD;  Location: [facility];  Service: Cardiovascular;  Laterality: N/A;  RCA  . ESOPHAGOGASTRODUODENOSCOPY  2013   erosive reflux esophagitis, s/p empiric dilation. chronic duodenitis.   [name] LEFT HEART CATH AND CORONARY ANGIOGRAPHY N/A 07/29/2016   Procedure: Left Heart Cath and Coronary Angiography;  Surgeon: Leonie Man, MD;  Location: [facility];  Service: Cardiovascular;  Laterality: N/A;  . NASAL SEPTUM SURGERY    . NASAL SINUS SURGERY     "cleaned out my sinus"  .  PERIPHERAL VASCULAR CATHETERIZATION N/A 11/26/2015   Procedure: Abdominal Aortogram w/Lower Extremity;  Surgeon: Wellington Hampshire, MD;  Location: [facility];  Service: Cardiovascular;  Laterality: N/A;  . TONSILLECTOMY    . TRANSURETHRAL RESECTION OF PROSTATE  2009     Current Outpatient Medications  Medication Sig Dispense Refill  . ACCU-CHEK AVIVA PLUS test strip USE TO TEST BLOOD SUAGR TWICE DAILY 200 each 2  . alfuzosin (UROXATRAL) 10 MG 24 hr tablet Take 10 mg by mouth daily with breakfast.    . amLODipine (NORVASC) 5 MG tablet TAKE 1 TABLET BY MOUTH DAILY 90 tablet 1  . aspirin EC 81 MG tablet Take 81 mg by mouth daily.    [name] atorvastatin (LIPITOR) 10 MG tablet Take 1 tablet (10 mg total) by mouth once a week. 90 tablet 1  . docusate sodium (COLACE) 100 MG capsule Take 100 mg by mouth as needed for mild constipation.     . Evolocumab (REPATHA SURECLICK) [id number] MG/ML SOAJ Inject 140 mg into the skin every 14 (fourteen) days. 2 pen 12  . Insulin Pen Needle (B-D ULTRAFINE III SHORT PEN) 31G X 8 MM MISC Inject insulin SQ daily Dx:E11.8 100 each 3  . JANUVIA 100 MG tablet TAKE 1 TABLET BY MOUTH DAILY 90 tablet 1  . LANTUS SOLOSTAR 100 UNIT/ML Solostar Pen Inject 22 Units into the skin daily. Increase by 24 units every 3 days (Patient taking differently: Inject into the skin daily. Increase by 26 units every day) 15 mL 3  . losartan (COZAAR) 25 MG tablet TAKE 1 TABLET(25 MG) BY MOUTH DAILY 90 tablet 1  . metFORMIN (GLUCOPHAGE) 1000 MG tablet TAKE 1 TABLET BY MOUTH TWICE DAILY WITH MEALS 180 tablet 1  . metoprolol tartrate (LOPRESSOR) 25 MG tablet TAKE 1 TABLET(25 MG) BY MOUTH TWICE DAILY 180 tablet 3  . MULTIPLE VITAMIN PO Take 1 tablet by mouth daily.    . nitroGLYCERIN (NITROSTAT) 0.4 MG SL tablet Place 1 tablet (0.4 mg total) under the tongue every 5 (five) minutes as needed for chest pain. 5 tablet 0  . pantoprazole (PROTONIX) 40 MG tablet TAKE 1 TABLET(40 MG) BY MOUTH DAILY 90 tablet  1  . tadalafil (CIALIS) 20 MG tablet Take 0.5-1 tablets (10-20 mg total) by mouth every other day as needed for erectile dysfunction. 5 tablet 1   No current facility-administered medications for this visit.    Allergies:   Plavix [clopidogrel bisulfate], Oxycontin [oxycodone hcl], Penicillins, Pioglitazone, Prednisone, and Simvastatin    Social History:  The patient  reports that he quit smoking about 25 years ago. His smoking use included cigarettes. He started smoking about 53 years ago. He has a 15.00 pack-year smoking history. He has never used smokeless tobacco. He reports that he does not drink alcohol or use drugs.   Family History:  The patient's family history includes Colon cancer (age of onset: 84) in his sister; Ovarian cancer in his mother.    ROS:  Please see the history of present illness.   Otherwise, review  of systems are positive for none.   All other systems are reviewed and negative.    PHYSICAL EXAM: VS:  BP (!) 132/56   Pulse 62   Ht 5\' 7"  (1.702 m)   Wt 182 lb 12.8 oz (82.9 kg)   BMI 28.63 kg/m  , BMI Body mass index is 28.63 kg/m. GEN: Well nourished, well developed, in no acute distress  HEENT: normal  Neck: no JVD, carotid bruits, or masses Cardiac: RRR; no  rubs, or gallops,no edema . 2/6 crescendo decrescendo systolic murmur in the aortic area which is mid peaking Respiratory:  clear to auscultation bilaterally, normal work of breathing GI: soft, nontender, nondistended, + BS MS: no deformity or atrophy  Skin: warm and dry, no rash Neuro:  Strength and sensation are intact Psych: euthymic mood, full affect Vascular: Femoral pulse: Normal bilaterally.  Distal pulses are not palpable  EKG:  EKG ordered today. EKG showed normal sinus rhythm with incomplete right bundle branch block, left axis deviation   Recent Labs: 07/03/2018: ALT 23 11/16/2018: BUN 24; Creatinine, Ser 0.76; Hemoglobin 14.1; Platelets 259.0; Potassium 4.8; Sodium 137    Lipid  Panel    Component Value Date/Time   CHOL 143 03/10/2018 0941   CHOL 129 10/04/2017 1139   TRIG 80.0 03/10/2018 0941   TRIG 96 03/12/2008 0000   HDL 35.80 (L) 03/10/2018 0941   HDL 36 (L) 10/04/2017 1139   CHOLHDL 4 03/10/2018 0941   VLDL 16.0 03/10/2018 0941   LDLCALC 92 03/10/2018 0941   LDLCALC 76 10/04/2017 1139   LDLCALC 91 03/12/2008 0000      Wt Readings from Last 3 Encounters:  02/06/19 182 lb 12.8 oz (82.9 kg)  12/13/18 182 lb 3.2 oz (82.6 kg)  11/16/18 180 lb (81.6 kg)       No flowsheet data found.    ASSESSMENT AND PLAN:  1.  Peripheral arterial disease: He is doing reasonably well with no significant claudication.  Continue medical therapy.    2. Coronary artery disease involving native coronary arteries without angina: Continue aggressive medical therapy.    3. Hyperlipidemia: The patient has known history of intolerance to statins and currently able to take only small dose atorvastatin.  Most recently, he was started on Repatha given that his LDL was 92.  He has been tolerating the medication.  I requested lipid and liver profile today.    4. Essential hypertension: Blood pressure is well controlled on current medications.  5.  Mild aortic stenosis: Recommend repeat echocardiogram in December 2021.    Disposition:   FU with me in 6 months  Signed,  [name], MD  02/06/2019 10:05 AM    [place]

## 2019-02-09 ENCOUNTER — Other Ambulatory Visit: Payer: Self-pay | Admitting: Physician Assistant

## 2019-02-21 ENCOUNTER — Other Ambulatory Visit: Payer: Self-pay

## 2019-02-21 MED ORDER — METFORMIN HCL 1000 MG PO TABS: 1000.0000 mg | ORAL_TABLET | Freq: Two times a day (BID) | ORAL | 1 refills | Status: DC

## 2019-03-13 ENCOUNTER — Other Ambulatory Visit: Payer: Self-pay | Admitting: Physician Assistant

## 2019-03-13 MED ORDER — LOSARTAN POTASSIUM 25 MG PO TABS: ORAL_TABLET | ORAL | 0 refills | Status: DC

## 2019-03-14 ENCOUNTER — Encounter: Payer: Self-pay | Admitting: Physician Assistant

## 2019-03-14 ENCOUNTER — Ambulatory Visit (INDEPENDENT_AMBULATORY_CARE_PROVIDER_SITE_OTHER): Payer: Medicare PPO

## 2019-03-14 ENCOUNTER — Other Ambulatory Visit: Payer: Self-pay

## 2019-03-14 ENCOUNTER — Ambulatory Visit (INDEPENDENT_AMBULATORY_CARE_PROVIDER_SITE_OTHER): Payer: Medicare PPO | Admitting: Physician Assistant

## 2019-03-14 VITALS — BP 140/62 | HR 76 | Temp 98.2°F | Ht 67.0 in | Wt 180.8 lb

## 2019-03-14 VITALS — BP 140/62 | HR 76 | Temp 98.2°F | Resp 16 | Ht 67.0 in | Wt 180.8 lb

## 2019-03-14 DIAGNOSIS — Z0000 Encounter for general adult medical examination without abnormal findings: Secondary | ICD-10-CM | POA: Diagnosis not present

## 2019-03-14 DIAGNOSIS — R300 Dysuria: Secondary | ICD-10-CM | POA: Diagnosis not present

## 2019-03-14 DIAGNOSIS — Z0001 Encounter for general adult medical examination with abnormal findings: Secondary | ICD-10-CM

## 2019-03-14 DIAGNOSIS — E119 Type 2 diabetes mellitus without complications: Secondary | ICD-10-CM | POA: Diagnosis not present

## 2019-03-14 DIAGNOSIS — I10 Essential (primary) hypertension: Secondary | ICD-10-CM | POA: Diagnosis not present

## 2019-03-14 DIAGNOSIS — Z9861 Coronary angioplasty status: Secondary | ICD-10-CM

## 2019-03-14 DIAGNOSIS — E782 Mixed hyperlipidemia: Secondary | ICD-10-CM

## 2019-03-14 DIAGNOSIS — I739 Peripheral vascular disease, unspecified: Secondary | ICD-10-CM | POA: Diagnosis not present

## 2019-03-14 DIAGNOSIS — K219 Gastro-esophageal reflux disease without esophagitis: Secondary | ICD-10-CM

## 2019-03-14 DIAGNOSIS — I2510 Atherosclerotic heart disease of native coronary artery without angina pectoris: Secondary | ICD-10-CM

## 2019-03-14 LAB — POCT URINALYSIS DIPSTICK
Bilirubin, UA: NEGATIVE
Blood, UA: NEGATIVE
Glucose, UA: NEGATIVE
Ketones, UA: NEGATIVE
Leukocytes, UA: NEGATIVE
Nitrite, UA: NEGATIVE
Protein, UA: NEGATIVE
Spec Grav, UA: 1.02 (ref 1.010–1.025)
Urobilinogen, UA: 0.2 E.U./dL
pH, UA: 6 (ref 5.0–8.0)

## 2019-03-14 LAB — COMPREHENSIVE METABOLIC PANEL
ALT: 23 U/L (ref 0–53)
AST: 17 U/L (ref 0–37)
Albumin: 4.4 g/dL (ref 3.5–5.2)
Alkaline Phosphatase: 55 U/L (ref 39–117)
BUN: 24 mg/dL — ABNORMAL HIGH (ref 6–23)
CO2: 30 mEq/L (ref 19–32)
Calcium: 9.4 mg/dL (ref 8.4–10.5)
Chloride: 101 mEq/L (ref 96–112)
Creatinine, Ser: 0.71 mg/dL (ref 0.40–1.50)
GFR: 106.74 mL/min (ref >60.00)
Glucose, Bld: 163 mg/dL — ABNORMAL HIGH (ref 70–99)
Potassium: 4.3 mEq/L (ref 3.5–5.1)
Sodium: 138 mEq/L (ref 135–145)
Total Bilirubin: 0.7 mg/dL (ref 0.2–1.2)
Total Protein: 6.6 g/dL (ref 6.0–8.3)

## 2019-03-14 LAB — LIPID PANEL
Cholesterol: 72 mg/dL (ref 0–200)
HDL: 36.6 mg/dL — ABNORMAL LOW (ref >39.00)
LDL Cholesterol: 22 mg/dL (ref 0–99)
NonHDL: 35.64
Total CHOL/HDL Ratio: 2
Triglycerides: 67 mg/dL (ref 0.0–149.0)
VLDL: 13.4 mg/dL (ref 0.0–40.0)

## 2019-03-14 LAB — HEMOGLOBIN A1C: Hgb A1c MFr Bld: 7.6 % — ABNORMAL HIGH (ref 4.6–6.5)

## 2019-03-14 MED ORDER — FLUTICASONE PROPIONATE 50 MCG/ACT NA SUSP: 2.0000 | Freq: Every day | NASAL | 0 refills | Status: DC

## 2019-03-14 NOTE — Patient Instructions (Signed)
Please go to the lab for blood work.   Our office will call you with your results unless you have chosen to receive results via MyChart.  If your blood work is normal we will follow-up each year for physicals and as scheduled for chronic medical problems.  If anything is abnormal we will treat accordingly and get you in for a follow-up.  Please start the Flonase once daily to help with nasal congestion, drainage and ear pressure. I think this is contributing to nighttime symptoms.  I do want you to check BP, pulse and blood sugar levels if you have recurrence of the dizziness/jitteriness. Even though I feel this is related to fluid buildup behind the ear, I want to make sure there is not more than one thing going on.   We will get you back in with your orthopedist. I want you to schedule a follow-up with your Pulmonologist for chronic cough so they can do lung function testing.    Preventive Care 27 Years and Older, Male Preventive care refers to lifestyle choices and visits with your health care provider that can promote health and wellness. This includes:  A yearly physical exam. This is also called an annual well check.  Regular dental and eye exams.  Immunizations.  Screening for certain conditions.  Healthy lifestyle choices, such as diet and exercise. What can I expect for my preventive care visit? Physical exam Your health care provider will check:  Height and weight. These may be used to calculate body mass index (BMI), which is a measurement that tells if you are at a healthy weight.  Heart rate and blood pressure.  Your skin for abnormal spots. Counseling Your health care provider may ask you questions about:  Alcohol, tobacco, and drug use.  Emotional well-being.  Home and relationship well-being.  Sexual activity.  Eating habits.  History of falls.  Memory and ability to understand (cognition).  Work and work Statistician. What immunizations do I  need?  Influenza (flu) vaccine  This is recommended every year. Tetanus, diphtheria, and pertussis (Tdap) vaccine  You may need a Td booster every 10 years. Varicella (chickenpox) vaccine  You may need this vaccine if you have not already been vaccinated. Zoster (shingles) vaccine  You may need this after age 33. Pneumococcal conjugate (PCV13) vaccine  One dose is recommended after age 16. Pneumococcal polysaccharide (PPSV23) vaccine  One dose is recommended after age 68. Measles, mumps, and rubella (MMR) vaccine  You may need at least one dose of MMR if you were born in 1957 or later. You may also need a second dose. Meningococcal conjugate (MenACWY) vaccine  You may need this if you have certain conditions. Hepatitis A vaccine  You may need this if you have certain conditions or if you travel or work in places where you may be exposed to hepatitis A. Hepatitis B vaccine  You may need this if you have certain conditions or if you travel or work in places where you may be exposed to hepatitis B. Haemophilus influenzae type b (Hib) vaccine  You may need this if you have certain conditions. You may receive vaccines as individual doses or as more than one vaccine together in one shot (combination vaccines). Talk with your health care provider about the risks and benefits of combination vaccines. What tests do I need? Blood tests  Lipid and cholesterol levels. These may be checked every 5 years, or more frequently depending on your overall health.  Hepatitis C test.  Hepatitis B test. Screening  Lung cancer screening. You may have this screening every year starting at age 57 if you have a 30-pack-year history of smoking and currently smoke or have quit within the past 15 years.  Colorectal cancer screening. All adults should have this screening starting at age 12 and continuing until age 28. Your health care provider may recommend screening at age 67 if you are at  increased risk. You will have tests every 1-10 years, depending on your results and the type of screening test.  Prostate cancer screening. Recommendations will vary depending on your family history and other risks.  Diabetes screening. This is done by checking your blood sugar (glucose) after you have not eaten for a while (fasting). You may have this done every 1-3 years.  Abdominal aortic aneurysm (AAA) screening. You may need this if you are a current or former smoker.  Sexually transmitted disease (STD) testing. Follow these instructions at home: Eating and drinking  Eat a diet that includes fresh fruits and vegetables, whole grains, lean protein, and low-fat dairy products. Limit your intake of foods with high amounts of sugar, saturated fats, and salt.  Take vitamin and mineral supplements as recommended by your health care provider.  Do not drink alcohol if your health care provider tells you not to drink.  If you drink alcohol: ? Limit how much you have to 0-2 drinks a day. ? Be aware of how much alcohol is in your drink. In the [place] one drink equals one 12 oz bottle of beer (355 mL), one 5 oz glass of wine (148 mL), or one 1 oz glass of hard liquor (44 mL). Lifestyle  Take daily care of your teeth and gums.  Stay active. Exercise for at least 30 minutes on 5 or more days each week.  Do not use any products that contain nicotine or tobacco, such as cigarettes, e-cigarettes, and chewing tobacco. If you need help quitting, ask your health care provider.  If you are sexually active, practice safe sex. Use a condom or other form of protection to prevent STIs (sexually transmitted infections).  Talk with your health care provider about taking a low-dose aspirin or statin. What's next?  Visit your health care provider once a year for a well check visit.  Ask your health care provider how often you should have your eyes and teeth checked.  Stay up to date on all  vaccines. This information is not intended to replace advice given to you by your health care provider. Make sure you discuss any questions you have with your health care provider. Document Revised: 02/02/2018 Document Reviewed: 02/02/2018 Elsevier Patient Education  2020 [employer].

## 2019-03-14 NOTE — Progress Notes (Signed)
Subjective:   Marcus Norton is a 79 y.o. male who presents for Medicare Annual/Subsequent preventive examination.  Review of Systems:   Cardiac Risk Factors include: advanced age (>46men, >67 women);diabetes mellitus;hypertension;male gender;dyslipidemia    Objective:    Vitals: BP 140/62   Pulse 76   Temp 98.2 F (36.8 C) (Temporal)   Ht 5\' 7"  (1.702 m)   Wt 180 lb 12.8 oz (82 kg)   SpO2 93%   BMI 28.32 kg/m   Body mass index is 28.32 kg/m.  Advanced Directives 03/14/2019 02/04/2018 03/08/2017 10/01/2016 07/29/2016 07/29/2016 06/30/2016  Does Patient Have a Medical Advance Directive? No No No No Yes No No  Type of Advance Directive - - - - Press photographer - -  Does patient want to make changes to medical advance directive? - - - - No - Patient declined - -  Copy of [place] in Chart? - - - - Yes - -  Would patient like information on creating a medical advance directive? Yes (MAU/Ambulatory/Procedural Areas - Information given) No - Patient declined Yes (MAU/Ambulatory/Procedural Areas - Information given) No - Patient declined No - Patient declined Yes (MAU/Ambulatory/Procedural Areas - Information given) No - Patient declined  Pre-existing out of facility DNR order (yellow form or pink MOST form) - - - - - - -    Tobacco Social History   Tobacco Use  Smoking Status Former Smoker  . Packs/day: 1.00  . Years: 15.00  . Pack years: 15.00  . Types: Cigarettes  . Start date: 03/08/1965  . Quit date: 07/07/1993  . Years since quitting: 25.7  Smokeless Tobacco Never Used     Counseling given: Not Answered   Clinical Intake:  Pre-visit preparation completed: Yes  Pain : No/denies pain  Diabetes: Yes CBG done?: No Did pt. bring in CBG monitor from home?: No  How often do you need to have someone help you when you read instructions, pamphlets, or other written materials from your doctor or pharmacy?: 1 - Never  Interpreter Needed?:  No  Information entered by :: Denman George LPN  Past Medical History:  Diagnosis Date  . 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 Arthritis    "lower back" (07/29/2016)  . ASCVD (arteriosclerotic cardiovascular disease)    -luminal irregularities in 1998 and 2005; normal EF  . Benign prostatic hypertrophy    status post transurethral resection of the prostate  . CAD (coronary artery disease)    a. Cath 07/29/16 99% ostial RCA & 70% pRCA s/p cutting balloon angioplasty and single SYNERGY DES 2.5X28. 100% very Distal 1st RPLB lesion --> the occlusion site moved further distal with guidewire advancement;  40% ostial LAD; 50% Ost Cx to Prox Cx lesion.  . Coronary artery disease   . Dysphagia   . Erectile dysfunction   . GERD (gastroesophageal reflux disease)   . Heart murmur   . History of hiatal hernia   . History of kidney stones   . Hyperlipidemia   . Hypertension   . Seasonal allergies   . Tobacco abuse    20 pack years; discontinued 1995  . Type II diabetes mellitus ([place])    Past Surgical History:  Procedure Laterality Date  . APPENDECTOMY    . CATARACT EXTRACTION W/ INTRAOCULAR LENS  IMPLANT, BILATERAL Bilateral   . COLONOSCOPY  07/20/2011   Dr. Gala Romney: multiple hyperplastic polyps. Surveillance 2018  . COLONOSCOPY N/A 06/30/2016   Procedure: COLONOSCOPY;  Surgeon: Manus Rudd  M, MD;  [facility];  Service: Endoscopy;  Laterality: N/A;  10:00am  . COLONOSCOPY W/ BIOPSIES AND POLYPECTOMY  07/08/08, 06/2011   Dr Madolyn Frieze papilla, diminutive rectal polyp ablated the, left-sided diverticula, piecemeal polypectomy of hepatic flexure adenomatous polyp, diminutive polyps near hepatic flexure ablated  . CORONARY ANGIOPLASTY WITH STENT PLACEMENT  07/29/2016  . CORONARY STENT INTERVENTION N/A 07/29/2016   Procedure: Coronary Stent Intervention;  Surgeon: Leonie Man, MD;  Location: [facility];  Service: Cardiovascular;  Laterality: N/A;  RCA  .  ESOPHAGOGASTRODUODENOSCOPY  2013   erosive reflux esophagitis, s/p empiric dilation. chronic duodenitis.   [name] LEFT HEART CATH AND CORONARY ANGIOGRAPHY N/A 07/29/2016   Procedure: Left Heart Cath and Coronary Angiography;  Surgeon: Leonie Man, MD;  Location: [facility];  Service: Cardiovascular;  Laterality: N/A;  . NASAL SEPTUM SURGERY    . NASAL SINUS SURGERY     "cleaned out my sinus"  . PERIPHERAL VASCULAR CATHETERIZATION N/A 11/26/2015   Procedure: Abdominal Aortogram w/Lower Extremity;  Surgeon: Wellington Hampshire, MD;  Location: [facility];  Service: Cardiovascular;  Laterality: N/A;  . TONSILLECTOMY    . TRANSURETHRAL RESECTION OF PROSTATE  2009   Family History  Problem Relation Age of Onset  . Ovarian cancer Mother   . Colon cancer Sister 32       deceased from colon cancer   Social History   Socioeconomic History  . Marital status: Married    Spouse name: Not on file  . Number of children: 2  . Years of education: Not on file  . Highest education level: Not on file  Occupational History  . Occupation: Dietitian, 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    Comment: [employer]  Tobacco Use  . Smoking status: Former Smoker    Packs/day: 1.00    Years: 15.00    Pack years: 15.00    Types: Cigarettes    Start date: 03/08/1965    Quit date: 07/07/1993    Years since quitting: 25.7  . Smokeless tobacco: Never Used  Substance and Sexual Activity  . Alcohol use: No    Alcohol/week: 0.0 standard drinks    Comment: 07/29/2016 "last drink was 4 wks ago; had a beer"  . Drug use: No  . Sexual activity: Not Currently  Other Topics Concern  . Not on file  Social History Narrative   5 grandchildren    Social Determinants of Health   Financial Resource Strain:   . Difficulty of Paying Living Expenses: Not on file  Food Insecurity:   . Worried About Charity fundraiser in the Last Year: Not on file  . Ran Out of Food in the Last Year: Not on file   Transportation Needs:   . Lack of Transportation (Medical): Not on file  . Lack of Transportation (Non-Medical): Not on file  Physical Activity:   . Days of Exercise per Week: Not on file  . Minutes of Exercise per Session: Not on file  Stress:   . Feeling of Stress : Not on file  Social Connections:   . Frequency of Communication with Friends and Family: Not on file  . Frequency of Social Gatherings with Friends and Family: Not on file  . Attends Religious Services: Not on file  . Active Member of Clubs or Organizations: Not on file  . Attends Archivist Meetings: Not on file  . Marital Status: Not on file  Outpatient Encounter Medications as of 03/14/2019  Medication Sig  . ACCU-CHEK AVIVA PLUS test strip USE TO TEST BLOOD SUGAR TWICE DAILY  . alfuzosin (UROXATRAL) 10 MG 24 hr tablet Take 10 mg by mouth daily with breakfast.  . amLODipine (NORVASC) 5 MG tablet TAKE 1 TABLET BY MOUTH DAILY  . aspirin EC 81 MG tablet Take 81 mg by mouth daily.  [name] atorvastatin (LIPITOR) 10 MG tablet Take 1 tablet (10 mg total) by mouth once a week.  . docusate sodium (COLACE) 100 MG capsule Take 100 mg by mouth as needed for mild constipation.   . Evolocumab (REPATHA SURECLICK) [id number] MG/ML SOAJ Inject 140 mg into the skin every 14 (fourteen) days.  . Insulin Pen Needle (B-D ULTRAFINE III SHORT PEN) 31G X 8 MM MISC Inject insulin SQ daily Dx:E11.8  . JANUVIA 100 MG tablet TAKE 1 TABLET BY MOUTH DAILY  . LANTUS SOLOSTAR 100 UNIT/ML Solostar Pen Inject 22 Units into the skin daily. Increase by 24 units every 3 days (Patient taking differently: Inject into the skin daily. Increase by 26 units every day)  . losartan (COZAAR) 25 MG tablet TAKE 1 TABLET(25 MG) BY MOUTH DAILY  . metFORMIN (GLUCOPHAGE) 1000 MG tablet Take 1 tablet (1,000 mg total) by mouth 2 (two) times daily with a meal.  . metoprolol tartrate (LOPRESSOR) 25 MG tablet TAKE 1 TABLET(25 MG) BY MOUTH TWICE DAILY  . MULTIPLE VITAMIN PO  Take 1 tablet by mouth daily.  . nitroGLYCERIN (NITROSTAT) 0.4 MG SL tablet Place 1 tablet (0.4 mg total) under the tongue every 5 (five) minutes as needed for chest pain.  . pantoprazole (PROTONIX) 40 MG tablet TAKE 1 TABLET(40 MG) BY MOUTH DAILY  . tadalafil (CIALIS) 20 MG tablet Take 0.5-1 tablets (10-20 mg total) by mouth every other day as needed for erectile dysfunction.   No facility-administered encounter medications on file as of 03/14/2019.    Activities of Daily Living In your present state of health, do you have any difficulty performing the following activities: 03/14/2019 06/27/2018  Hearing? Y N  Vision? N N  Difficulty concentrating or making decisions? N N  Walking or climbing stairs? N N  Dressing or bathing? N N  Doing errands, shopping? N N  Preparing Food and eating ? N -  Using the Toilet? N -  In the past six months, have you accidently leaked urine? N -  Do you have problems with loss of bowel control? N -  Managing your Medications? N -  Managing your Finances? N -  Housekeeping or managing your Housekeeping? N -  Some recent data might be hidden    Patient Care Team: Delorse Limber as PCP - General (Family Medicine) Orion Crook, MD (Inactive) (Urology) Gala Romney Cristopher Estimable, MD (Gastroenterology) Herminio Commons, MD as Attending Physician (Cardiology) Philemon Kingdom, MD as Consulting Physician (Internal Medicine) Allyn Kenner, MD (Dermatology) [facility], [place], MD as Attending Physician   Assessment:   This is a routine wellness examination for Saint.  Exercise Activities and Dietary recommendations Current Exercise Habits: The patient does not participate in regular exercise at present  Goals    . Patient Stated     Stay active.        Fall Risk Fall Risk  03/14/2019 03/08/2017 03/08/2017 10/01/2016 03/04/2016  Falls in the past year? 0 No No No No  Injury with Fall? 0 - - - -  Risk for fall due to : - - - (No  Data) -   Risk for fall due to: Comment - - - No history of falls -  Follow up Falls evaluation completed;Education provided;Falls prevention discussed - - - -   Is the patient's home free of loose throw rugs in walkways, pet beds, electrical cords, etc?   yes      Grab bars in the bathroom? yes      Handrails on the stairs?   yes      Adequate lighting?   yes  Timed Get Up and Go Performed: completed and within normal timeframe; no gait abnormalities noted   Depression Screen PHQ 2/9 Scores 03/14/2019 06/27/2018 03/10/2018 03/08/2017  PHQ - 2 Score 0 0 0 1  PHQ- 9 Score 2 2 0 1  Exception Documentation - - - -    Cognitive Function- no cognitive concerns at this time  MMSE - Mini Mental State Exam 03/10/2018 03/08/2017  Orientation to time 5 5  Orientation to Place 5 5  Registration 3 3  Attention/ Calculation 3 5  Recall 3 1  Language- name 2 objects 2 2  Language- repeat 1 1  Language- follow 3 step command 3 3  Language- read & follow direction 1 1  Write a sentence 1 1  Copy design 1 1  Total score 28 28     6CIT Screen 03/14/2019  What Year? 0 points  What month? 0 points  What time? 0 points  Count back from 20 0 points  Months in reverse 0 points  Repeat phrase 2 points  Total Score 2    Immunization History  Administered Date(s) Administered  . Fluad Quad(high Dose 65+) 11/16/2018  . Influenza, High Dose Seasonal PF 11/03/2015, 11/17/2017  . Influenza,inj,Quad PF,6+ Mos 11/20/2016  . Influenza,inj,quad, With Preservative 11/22/2016  . Influenza-Unspecified 11/20/2016  . Pneumococcal Conjugate-13 03/04/2016  . Pneumococcal Polysaccharide-23 03/08/2017  . Td 03/11/2011  . Zoster Recombinat (Shingrix) 05/01/2016, 10/05/2016    Qualifies for Shingles Vaccine? Shingrix completed   Screening Tests Health Maintenance  Topic Date Due  . DTAP VACCINES (1) 05/07/1940  . OPHTHALMOLOGY EXAM  05/24/2018  . HEMOGLOBIN A1C  05/16/2019  . FOOT EXAM  11/16/2019  .  DTaP/Tdap/Td (2 - Tdap) 03/10/2021  . TETANUS/TDAP  03/10/2021  . INFLUENZA VACCINE  Completed  . PNA vac Low Risk Adult  Completed   Cancer Screenings: Lung: Low Dose CT Chest recommended if Age 48-80 years, 30 pack-year currently smoking OR have quit w/in 15years. Patient does not qualify. Colorectal: colonoscopy 06/30/16; no longer indicated      Plan:  I have personally reviewed and addressed the Medicare Annual Wellness questionnaire and have noted the following in the patient's chart:  A. Medical and social history B. Use of alcohol, tobacco or illicit drugs  C. Current medications and supplements D. Functional ability and status E.  Nutritional status F.  Physical activity G. Advance directives H. List of other physicians I.  Hospitalizations, surgeries, and ER visits in previous 12 months J.  [place] such as hearing and vision if needed, cognitive and depression L. Referrals, records requested, and appointments- will request diabetic eye exam notes   In addition, I have reviewed and discussed with patient certain preventive protocols, quality metrics, and best practice recommendations. A written personalized care plan for preventive services as well as general preventive health recommendations were provided to patient.   Signed,  Denman George, LPN  Nurse Health Advisor   Nurse Notes: Patient will discuss concerns with  pcp with problems of elevated blood sugars and jittery feeling mainly at night.

## 2019-03-14 NOTE — Patient Instructions (Signed)
[name] , Thank you for taking time to come for your Medicare Wellness Visit. I appreciate your ongoing commitment to your health goals. Please review the following plan we discussed and let me know if I can assist you in the future.   Screening recommendations/referrals: Colorectal Screening: up to date; last colonoscopy 06/30/16  Vision and Dental Exams: Recommended annual ophthalmology exams for early detection of glaucoma and other disorders of the eye Recommended annual dental exams for proper oral hygiene  Diabetic Exams: Diabetic Eye Exam: recommended yearly Diabetic Foot Exam: recommended yearly; up to date   Vaccinations: Influenza vaccine: completed 11/16/18 Pneumococcal vaccine: up to date; last 03/08/17 Tdap vaccine: up to date; last 03/11/11 Shingles vaccine: Shingrix completed   Advanced directives: Please bring a copy of your POA (Power of [place]) and/or Living Will to your next appointment.  Goals: Recommend to drink at least 6-8 8oz glasses of water per day and consume a balanced diet rich in fresh fruits and vegetables.   Next appointment: Please schedule your Annual Wellness Visit with your Nurse Health Advisor in one year.  Preventive Care 80 Years and Older, Male Preventive care refers to lifestyle choices and visits with your health care provider that can promote health and wellness. What does preventive care include?  A yearly physical exam. This is also called an annual well check.  Dental exams once or twice a year.  Routine eye exams. Ask your health care provider how often you should have your eyes checked.  Personal lifestyle choices, including:  Daily care of your teeth and gums.  Regular physical activity.  Eating a healthy diet.  Avoiding tobacco and drug use.  Limiting alcohol use.  Practicing safe sex.  Taking low doses of aspirin every day if recommended by your health care provider..  Taking vitamin and mineral supplements as  recommended by your health care provider. What happens during an annual well check? The services and screenings done by your health care provider during your annual well check will depend on your age, overall health, lifestyle risk factors, and family history of disease. Counseling  Your health care provider may ask you questions about your:  Alcohol use.  Tobacco use.  Drug use.  Emotional well-being.  Home and relationship well-being.  Sexual activity.  Eating habits.  History of falls.  Memory and ability to understand (cognition).  Work and work Statistician. Screening  You may have the following tests or measurements:  Height, weight, and BMI.  Blood pressure.  Lipid and cholesterol levels. These may be checked every 5 years, or more frequently if you are over 80 years old.  Skin check.  Lung cancer screening. You may have this screening every year starting at age 80 if you have a 30-pack-year history of smoking and currently smoke or have quit within the past 15 years.  Fecal occult blood test (FOBT) of the stool. You may have this test every year starting at age 80.  Flexible sigmoidoscopy or colonoscopy. You may have a sigmoidoscopy every 5 years or a colonoscopy every 10 years starting at age 80.  Prostate cancer screening. Recommendations will vary depending on your family history and other risks.  Hepatitis C blood test.  Hepatitis B blood test.  Sexually transmitted disease (STD) testing.  Diabetes screening. This is done by checking your blood sugar (glucose) after you have not eaten for a while (fasting). You may have this done every 1-3 years.  Abdominal aortic aneurysm (AAA) screening. You may need  this if you are a current or former smoker.  Osteoporosis. You may be screened starting at age 59 if you are at high risk. Talk with your health care provider about your test results, treatment options, and if necessary, the need for more tests.  Vaccines  Your health care provider may recommend certain vaccines, such as:  Influenza vaccine. This is recommended every year.  Tetanus, diphtheria, and acellular pertussis (Tdap, Td) vaccine. You may need a Td booster every 10 years.  Zoster vaccine. You may need this after age 80.  Pneumococcal 13-valent conjugate (PCV13) vaccine. One dose is recommended after age 80.  Pneumococcal polysaccharide (PPSV23) vaccine. One dose is recommended after age 80. Talk to your health care provider about which screenings and vaccines you need and how often you need them. This information is not intended to replace advice given to you by your health care provider. Make sure you discuss any questions you have with your health care provider. Document Released: 03/07/2015 Document Revised: 10/29/2015 Document Reviewed: 12/10/2014 Elsevier Interactive Patient Education  2017 [place] Prevention in the Home Falls can cause injuries. They can happen to people of all ages. There are many things you can do to make your home safe and to help prevent falls. What can I do on the outside of my home?  Regularly fix the edges of walkways and driveways and fix any cracks.  Remove anything that might make you trip as you walk through a door, such as a raised step or threshold.  Trim any bushes or trees on the path to your home.  Use bright outdoor lighting.  Clear any walking paths of anything that might make someone trip, such as rocks or tools.  Regularly check to see if handrails are loose or broken. Make sure that both sides of any steps have handrails.  Any raised decks and porches should have guardrails on the edges.  Have any leaves, snow, or ice cleared regularly.  Use sand or salt on walking paths during winter.  Clean up any spills in your garage right away. This includes oil or grease spills. What can I do in the bathroom?  Use night lights.  Install grab bars by the toilet  and in the tub and shower. Do not use towel bars as grab bars.  Use non-skid mats or decals in the tub or shower.  If you need to sit down in the shower, use a plastic, non-slip stool.  Keep the floor dry. Clean up any water that spills on the floor as soon as it happens.  Remove soap buildup in the tub or shower regularly.  Attach bath mats securely with double-sided non-slip rug tape.  Do not have throw rugs and other things on the floor that can make you trip. What can I do in the bedroom?  Use night lights.  Make sure that you have a light by your bed that is easy to reach.  Do not use any sheets or blankets that are too big for your bed. They should not hang down onto the floor.  Have a firm chair that has side arms. You can use this for support while you get dressed.  Do not have throw rugs and other things on the floor that can make you trip. What can I do in the kitchen?  Clean up any spills right away.  Avoid walking on wet floors.  Keep items that you use a lot in easy-to-reach places.  If you  need to reach something above you, use a strong step stool that has a grab bar.  Keep electrical cords out of the way.  Do not use floor polish or wax that makes floors slippery. If you must use wax, use non-skid floor wax.  Do not have throw rugs and other things on the floor that can make you trip. What can I do with my stairs?  Do not leave any items on the stairs.  Make sure that there are handrails on both sides of the stairs and use them. Fix handrails that are broken or loose. Make sure that handrails are as long as the stairways.  Check any carpeting to make sure that it is firmly attached to the stairs. Fix any carpet that is loose or worn.  Avoid having throw rugs at the top or bottom of the stairs. If you do have throw rugs, attach them to the floor with carpet tape.  Make sure that you have a light switch at the top of the stairs and the bottom of the  stairs. If you do not have them, ask someone to add them for you. What else can I do to help prevent falls?  Wear shoes that:  Do not have high heels.  Have rubber bottoms.  Are comfortable and fit you well.  Are closed at the toe. Do not wear sandals.  If you use a stepladder:  Make sure that it is fully opened. Do not climb a closed stepladder.  Make sure that both sides of the stepladder are locked into place.  Ask someone to hold it for you, if possible.  Clearly mark and make sure that you can see:  Any grab bars or handrails.  First and last steps.  Where the edge of each step is.  Use tools that help you move around (mobility aids) if they are needed. These include:  Canes.  Walkers.  Scooters.  Crutches.  Turn on the lights when you go into a dark area. Replace any light bulbs as soon as they burn out.  Set up your furniture so you have a clear path. Avoid moving your furniture around.  If any of your floors are uneven, fix them.  If there are any pets around you, be aware of where they are.  Review your medicines with your doctor. Some medicines can make you feel dizzy. This can increase your chance of falling. Ask your doctor what other things that you can do to help prevent falls. This information is not intended to replace advice given to you by your health care provider. Make sure you discuss any questions you have with your health care provider. Document Released: 12/05/2008 Document Revised: 07/17/2015 Document Reviewed: 03/15/2014 Elsevier Interactive Patient Education  2017 [employer].

## 2019-03-14 NOTE — Progress Notes (Signed)
Patient presents to clinic today for annual exam.  Patient is fasting for labs.  Chronic Issues: Diabetes Mellitus II -- Previously controlled with A1C at 7.9. Patient currently on a regimen of Metformin 1000 mg BID, Januvia 100 mg QD and Lantus 26 units daily. Endorses taking medications as directed. Is checking glucose levels as directed. Notes that they have occasionally run a bit higher since starting Repatha. Notes averaging mid-upper 100s. Patient remains on ARB daily. Is UTD on immunizations, foot exam and eye examination. Records being requested from last eye examination.  Hyperlipidemia -- Patient followed by [facility] at Cardiology. Currently on once weekly atorvastatin and Repatha injections. Tolerating well overall. Trying to keep a well-balanced diet.   Hypertension -- Currently on regimen of Losartan 25 mg QD, amlodipine 5mg  QD and Metoprolol 25 mg BID. Is taking as directed. Patient denies chest pain, palpitations, lightheadedness, dizziness, vision changes or frequent headaches.   Health Maintenance: Immunizations -- UTD Colonoscopy -- UTD  Past Medical History:  Diagnosis Date  . 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 Arthritis    "lower back" (07/29/2016)  . ASCVD (arteriosclerotic cardiovascular disease)    -luminal irregularities in 1998 and 2005; normal EF  . Benign prostatic hypertrophy    status post transurethral resection of the prostate  . CAD (coronary artery disease)    a. Cath 07/29/16 99% ostial RCA & 70% pRCA s/p cutting balloon angioplasty and single SYNERGY DES 2.5X28. 100% very Distal 1st RPLB lesion --> the occlusion site moved further distal with guidewire advancement;  40% ostial LAD; 50% Ost Cx to Prox Cx lesion.  . Coronary artery disease   . Dysphagia   . Erectile dysfunction   . GERD (gastroesophageal reflux disease)   . Heart murmur   . History of hiatal hernia   . History of kidney stones   . Hyperlipidemia   . Hypertension   . Seasonal  allergies   . Tobacco abuse    20 pack years; discontinued 1995  . Type II diabetes mellitus ([place])     Past Surgical History:  Procedure Laterality Date  . APPENDECTOMY    . CATARACT EXTRACTION W/ INTRAOCULAR LENS  IMPLANT, BILATERAL Bilateral   . COLONOSCOPY  07/20/2011   Dr. Gala Romney: multiple hyperplastic polyps. Surveillance 2018  . COLONOSCOPY N/A 06/30/2016   Procedure: COLONOSCOPY;  Surgeon: Daneil Dolin, MD;  Location: [facility];  Service: Endoscopy;  Laterality: N/A;  10:00am  . COLONOSCOPY W/ BIOPSIES AND POLYPECTOMY  07/08/08, 06/2011   Dr Madolyn Frieze papilla, diminutive rectal polyp ablated the, left-sided diverticula, piecemeal polypectomy of hepatic flexure adenomatous polyp, diminutive polyps near hepatic flexure ablated  . CORONARY ANGIOPLASTY WITH STENT PLACEMENT  07/29/2016  . CORONARY STENT INTERVENTION N/A 07/29/2016   Procedure: Coronary Stent Intervention;  Surgeon: Leonie Man, MD;  Location: [place] [facility];  Service: Cardiovascular;  Laterality: N/A;  RCA  . ESOPHAGOGASTRODUODENOSCOPY  2013   erosive reflux esophagitis, s/p empiric dilation. chronic duodenitis.   [name] LEFT HEART CATH AND CORONARY ANGIOGRAPHY N/A 07/29/2016   Procedure: Left Heart Cath and Coronary Angiography;  Surgeon: Leonie Man, MD;  Location: [place] [facility];  Service: Cardiovascular;  Laterality: N/A;  . NASAL SEPTUM SURGERY    . NASAL SINUS SURGERY     "cleaned out my sinus"  . PERIPHERAL VASCULAR CATHETERIZATION N/A 11/26/2015   Procedure: Abdominal Aortogram w/Lower Extremity;  Surgeon: Wellington Hampshire, MD;  Location: [place] [facility];  Service: Cardiovascular;  Laterality: N/A;  . TONSILLECTOMY    . TRANSURETHRAL RESECTION OF PROSTATE  2009    Current Outpatient Medications on File Prior to Visit  Medication Sig Dispense Refill  . ACCU-CHEK AVIVA PLUS test strip USE TO TEST BLOOD SUGAR TWICE DAILY 200 strip 3  . alfuzosin (UROXATRAL) 10 MG 24 hr tablet Take 10 mg by  mouth daily with breakfast.    . amLODipine (NORVASC) 5 MG tablet TAKE 1 TABLET BY MOUTH DAILY 90 tablet 1  . aspirin EC 81 MG tablet Take 81 mg by mouth daily.    [name] atorvastatin (LIPITOR) 10 MG tablet Take 1 tablet (10 mg total) by mouth once a week. 90 tablet 1  . docusate sodium (COLACE) 100 MG capsule Take 100 mg by mouth as needed for mild constipation.     . Evolocumab (REPATHA SURECLICK) [id number] MG/ML SOAJ Inject 140 mg into the skin every 14 (fourteen) days. 2 pen 12  . Insulin Pen Needle (B-D ULTRAFINE III SHORT PEN) 31G X 8 MM MISC Inject insulin SQ daily Dx:E11.8 100 each 3  . JANUVIA 100 MG tablet TAKE 1 TABLET BY MOUTH DAILY 90 tablet 1  . LANTUS SOLOSTAR 100 UNIT/ML Solostar Pen Inject 22 Units into the skin daily. Increase by 24 units every 3 days (Patient taking differently: Inject into the skin daily. Increase by 26 units every day) 15 mL 3  . losartan (COZAAR) 25 MG tablet TAKE 1 TABLET(25 MG) BY MOUTH DAILY 90 tablet 0  . metFORMIN (GLUCOPHAGE) 1000 MG tablet Take 1 tablet (1,000 mg total) by mouth 2 (two) times daily with a meal. 180 tablet 1  . metoprolol tartrate (LOPRESSOR) 25 MG tablet TAKE 1 TABLET(25 MG) BY MOUTH TWICE DAILY 180 tablet 3  . MULTIPLE VITAMIN PO Take 1 tablet by mouth daily.    . nitroGLYCERIN (NITROSTAT) 0.4 MG SL tablet Place 1 tablet (0.4 mg total) under the tongue every 5 (five) minutes as needed for chest pain. 5 tablet 0  . pantoprazole (PROTONIX) 40 MG tablet TAKE 1 TABLET(40 MG) BY MOUTH DAILY 90 tablet 1  . tadalafil (CIALIS) 20 MG tablet Take 0.5-1 tablets (10-20 mg total) by mouth every other day as needed for erectile dysfunction. 5 tablet 1   No current facility-administered medications on file prior to visit.    Allergies  Allergen Reactions  . Plavix [Clopidogrel Bisulfate] Shortness Of Breath  . Oxycontin [Oxycodone Hcl] Other (See Comments)    Malaise  . Penicillins Other (See Comments)    [place] Has patient had a PCN reaction  causing immediate rash, facial/tongue/throat swelling, SOB or lightheadedness with hypotension: No Has patient had a PCN reaction causing severe rash involving mucus membranes or skin necrosis: No Has patient had a PCN reaction that required hospitalization No Has patient had a PCN reaction occurring within the last 10 years: No If all of the above answers are "NO", then may proceed with Cephalosporin use.   . Pioglitazone Other (See Comments)    Made his chest hurt  . Prednisone Other (See Comments)    Reaction: muscle aches and soreness  . Simvastatin Other (See Comments)    Myalgias    Family History  Problem Relation Age of Onset  . Ovarian cancer Mother   . Colon cancer Sister 98       deceased from colon cancer    Social History   Socioeconomic History  . Marital status: Married    Spouse name: Not on file  .  Number of children: 2  . Years of education: Not on file  . Highest education level: Not on file  Occupational History  . Occupation: Dietitian, 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    Comment: [employer]  Tobacco Use  . Smoking status: Former Smoker    Packs/day: 1.00    Years: 15.00    Pack years: 15.00    Types: Cigarettes    Start date: 03/08/1965    Quit date: 07/07/1993    Years since quitting: 25.7  . Smokeless tobacco: Never Used  Substance and Sexual Activity  . Alcohol use: No    Alcohol/week: 0.0 standard drinks    Comment: 07/29/2016 "last drink was 4 wks ago; had a beer"  . Drug use: No  . Sexual activity: Not Currently  Other Topics Concern  . Not on file  Social History Narrative   5 grandchildren    Social Determinants of Health   Financial Resource Strain:   . Difficulty of Paying Living Expenses: Not on file  Food Insecurity:   . Worried About Charity fundraiser in the Last Year: Not on file  . Ran Out of Food in the Last Year: Not on file  Transportation Needs:   . Lack of Transportation (Medical): Not on file  . Lack  of Transportation (Non-Medical): Not on file  Physical Activity:   . Days of Exercise per Week: Not on file  . Minutes of Exercise per Session: Not on file  Stress:   . Feeling of Stress : Not on file  Social Connections:   . Frequency of Communication with Friends and Family: Not on file  . Frequency of Social Gatherings with Friends and Family: Not on file  . Attends Religious Services: Not on file  . Active Member of Clubs or Organizations: Not on file  . Attends Archivist Meetings: Not on file  . Marital Status: Not on file  Intimate Partner Violence:   . Fear of Current or Ex-Partner: Not on file  . Emotionally Abused: Not on file  . Physically Abused: Not on file  . Sexually Abused: Not on file   Review of Systems  Constitutional: Negative for fever and weight loss.  HENT: Negative for ear discharge, ear pain, hearing loss and tinnitus.   Eyes: Negative for blurred vision, double vision, photophobia and pain.  Respiratory: Negative for cough and shortness of breath.   Cardiovascular: Negative for chest pain and palpitations.  Gastrointestinal: Negative for abdominal pain, blood in stool, constipation, diarrhea, heartburn, melena, nausea and vomiting.  Genitourinary: Negative for dysuria, flank pain, frequency, hematuria and urgency.  Musculoskeletal: Negative for falls.  Neurological: Negative for dizziness, loss of consciousness and headaches.  Endo/Heme/Allergies: Negative for environmental allergies.  Psychiatric/Behavioral: Negative for depression, hallucinations, substance abuse and suicidal ideas. The patient is not nervous/anxious and does not have insomnia.     BP 140/62   Pulse 76   Temp 98.2 F (36.8 C) (Temporal)   Resp 16   Ht 5\' 7"  (1.702 m)   Wt 180 lb 12.8 oz (82 kg)   SpO2 93%   BMI 28.32 kg/m   Physical Exam Vitals reviewed.  Constitutional:      General: He is not in acute distress.    Appearance: He is well-developed. He is not  diaphoretic.  HENT:     Head: Normocephalic and atraumatic.     Right Ear: Tympanic membrane, ear canal and external ear normal.     Left Ear:  Tympanic membrane, ear canal and external ear normal.     Nose: Nose normal.     Mouth/Throat:     Pharynx: No posterior oropharyngeal erythema.  Eyes:     Conjunctiva/sclera: Conjunctivae normal.     Pupils: Pupils are equal, round, and reactive to light.  Neck:     Thyroid: No thyromegaly.  Cardiovascular:     Rate and Rhythm: Normal rate and regular rhythm.     Heart sounds: Normal heart sounds.  Pulmonary:     Effort: Pulmonary effort is normal. No respiratory distress.     Breath sounds: Normal breath sounds. No wheezing or rales.  Chest:     Chest wall: No tenderness.  Abdominal:     General: Bowel sounds are normal. There is no distension.     Palpations: Abdomen is soft. There is no mass.     Tenderness: There is no abdominal tenderness. There is no guarding or rebound.  Musculoskeletal:     Cervical back: Neck supple.  Lymphadenopathy:     Cervical: No cervical adenopathy.  Skin:    General: Skin is warm and dry.     Findings: No rash.  Neurological:     Mental Status: He is alert and oriented to person, place, and time.     Cranial Nerves: No cranial nerve deficit.     Recent Results (from the past 2160 hour(s))  Hepatic function panel     Status: None   Collection Time: 02/06/19 10:52 AM  Result Value Ref Range   Total Protein 6.7 6.0 - 8.5 g/dL   Albumin 4.3 3.7 - 4.7 g/dL   Bilirubin Total 0.6 0.0 - 1.2 mg/dL   Bilirubin, Direct 0.19 0.00 - 0.40 mg/dL   Alkaline Phosphatase 64 39 - 117 IU/L   AST 16 0 - 40 IU/L   ALT 27 0 - 44 IU/L  Lipid panel     Status: Abnormal   Collection Time: 02/06/19 10:52 AM  Result Value Ref Range   Cholesterol, Total 75 (L) 100 - 199 mg/dL   Triglycerides 60 0 - 149 mg/dL   HDL 37 (L) >39 mg/dL   VLDL Cholesterol Cal 14 5 - 40 mg/dL   LDL Chol Calc (NIH) 24 0 - 99 mg/dL    Chol/HDL Ratio 2.0 0.0 - 5.0 ratio    Comment:                                   T. Chol/HDL Ratio                                             Men  Women                               1/2 Avg.Risk  3.4    3.3                                   Avg.Risk  5.0    4.4                                2X Avg.Risk  9.6    7.1                                3X Avg.Risk 23.4   11.0     Assessment/Plan: 1. Visit for preventive health examination Depression screen negative. Health Maintenance reviewed. Preventive schedule discussed and handout given in AVS. Will obtain fasting labs today.   2. Diabetes mellitus without complication ([place]) Repeat labs today to see where we are at. HM parameters up-to-date. Will alter regimen according to results. Dietary and exercise recommendations again reviewed.  - Comprehensive metabolic panel - Hemoglobin A1c - Lipid panel  3. Essential hypertension Stable for age. Asymptomatic. Continue current regimen. Labs today. Follow-up with Cardiology as scheduled.  - Comprehensive metabolic panel - Hemoglobin A1c - Lipid panel  4. Mixed hyperlipidemia 5. PAD (peripheral artery disease) ([place]) 6. CAD S/P percutaneous coronary angioplasty Repeat labs today. Continue management per Cardiology - Comprehensive metabolic panel - Hemoglobin A1c - Lipid panel  7. Gastroesophageal reflux disease, unspecified whether esophagitis present Restart PPI daily. Dietary recommendations reviewed. Follow-up discussed.  8. Dysuria Noted at the end of the visit. Noted an episode of pain at the end of urination the other day. No ongoing symptoms. Is taking medications as directed by Urology. UA unremarkable today. Reassurance given. Follow-up with [place] or specialist for any recurrence of symptoms. - POCT urinalysis dipstick   Leeanne Rio, PA-C

## 2019-03-19 ENCOUNTER — Telehealth: Payer: Self-pay | Admitting: Physician Assistant

## 2019-03-19 NOTE — Telephone Encounter (Signed)
Pt returned call regarding lab results from 03/14/2019. Read him Cody's comments verbatim, pt understood and will call back to schedule 6 month f/up.

## 2019-03-19 NOTE — Telephone Encounter (Signed)
Pt given lab resu

## 2019-03-20 ENCOUNTER — Other Ambulatory Visit: Payer: Self-pay | Admitting: Emergency Medicine

## 2019-03-20 DIAGNOSIS — E119 Type 2 diabetes mellitus without complications: Secondary | ICD-10-CM

## 2019-03-20 MED ORDER — BD PEN NEEDLE SHORT U/F 31G X 8 MM MISC: 3 refills | Status: DC

## 2019-04-30 ENCOUNTER — Other Ambulatory Visit: Payer: Self-pay

## 2019-04-30 ENCOUNTER — Encounter: Payer: Self-pay | Admitting: Pulmonary Disease

## 2019-04-30 ENCOUNTER — Ambulatory Visit: Payer: Medicare PPO | Admitting: Pulmonary Disease

## 2019-04-30 VITALS — BP 138/62 | HR 87 | Temp 98.7°F | Ht 67.0 in | Wt 181.6 lb

## 2019-04-30 DIAGNOSIS — R05 Cough: Secondary | ICD-10-CM

## 2019-04-30 DIAGNOSIS — R059 Cough, unspecified: Secondary | ICD-10-CM

## 2019-04-30 MED ORDER — ESZOPICLONE 1 MG PO TABS: 1.0000 mg | ORAL_TABLET | Freq: Every evening | ORAL | 3 refills | Status: DC | PRN

## 2019-04-30 MED ORDER — MONTELUKAST SODIUM 10 MG PO TABS: 10.0000 mg | ORAL_TABLET | Freq: Every day | ORAL | 11 refills | Status: DC

## 2019-04-30 NOTE — Patient Instructions (Signed)
Protracted cough Associated nasal stuffiness and congestion Associated postnasal drip  Singulair 10 mg daily Flonase 2 sprays each nostril daily  Insomnia -Lunesta 1 mg nightly, may increase to 2 mg if 1 mg ineffective  Breathing study will be ordered to check for airway narrowing  We will see you back in about 6 to 8 weeks

## 2019-04-30 NOTE — Progress Notes (Signed)
Marcus Norton    UT:[id number]    [birth year]  Primary Care Physician:Martin, Luanna Cole, PA-C  Referring Physician: [name], PA-C [address] [place],  [place] [address]  Chief complaint:   Patient with a history of cough, Endorses nasal stuffiness and congestion, postnasal drip  HPI: He has a history of obstructive sleep apnea-not able to tolerate CPAP therapy -Machine already picked up by DME company as he was unable to tolerate it despite multiple attempts  History of insomnia -Has used multiple agents in the past that did not really help  He quit smoking over 25 years ago Cough is intermittent, no aggravating or relieving factors Associated wheezing Some shortness of breath with activity  Sleep study did reveal moderate obstructive sleep apnea, patient is reluctant to start any treatment at present we discussed options of treatment today He gives a history suggesting presence of significant restless legs, history corroborated by spouse A lot of leg restlessness at night especially when he is trying to calm down, sometimes gets out of bed to get his legs to settle down, occasionally will soak his legs and water to get rid of a sensation 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  He does have a chronic runny nose-inhaled steroids have not helped in the past Has tried multiple multiple other interventions without relief  Wakes up multiple times during the night to use the bathroom, about 3-4 times a night Usually goes to bed between 1030 and 11 PM Able to fall asleep easily Will usually wake up in about an hour and 1/2 to 2 hours Usually not able to sleep for more than 2 hours straight tries to get up in the morning between 7 and 8 AM Admits to a dry mouth in the mornings No morning headaches No night sweats Some memory loss, fair ability to concentrate  History of prostate issues for which he had a TURP in the past  He has used sleep aids in  the past including diazepam at a low dose  ESS of 16  Occupation: Was a Community education officer for many years, farmed for many years Exposures: No significant exposures Smoking history: Reformed smoker  Outpatient Encounter Medications as of 04/30/2019  Medication Sig  . ACCU-CHEK AVIVA PLUS test strip USE TO TEST BLOOD SUGAR TWICE DAILY  . alfuzosin (UROXATRAL) 10 MG 24 hr tablet Take 10 mg by mouth daily with breakfast.  . amLODipine (NORVASC) 5 MG tablet TAKE 1 TABLET BY MOUTH DAILY  . aspirin EC 81 MG tablet Take 81 mg by mouth daily.  [name] atorvastatin (LIPITOR) 10 MG tablet Take 1 tablet (10 mg total) by mouth once a week.  . Evolocumab (REPATHA SURECLICK) [id number] MG/ML SOAJ Inject 140 mg into the skin every 14 (fourteen) days.  . Insulin Pen Needle (B-D ULTRAFINE III SHORT PEN) 31G X 8 MM MISC Inject insulin subcutaneous daily Dx:E11.8  . JANUVIA 100 MG tablet TAKE 1 TABLET BY MOUTH DAILY  . LANTUS SOLOSTAR 100 UNIT/ML Solostar Pen Inject 22 Units into the skin daily. Increase by 24 units every 3 days (Patient taking differently: Inject into the skin daily. Increase by 26 units every day)  . losartan (COZAAR) 25 MG tablet TAKE 1 TABLET(25 MG) BY MOUTH DAILY  . metFORMIN (GLUCOPHAGE) 1000 MG tablet Take 1 tablet (1,000 mg total) by mouth 2 (two) times daily with a meal.  . metoprolol tartrate (LOPRESSOR) 25 MG tablet TAKE 1 TABLET(25 MG)  BY MOUTH TWICE DAILY  . MULTIPLE VITAMIN PO Take 1 tablet by mouth daily.  . nitroGLYCERIN (NITROSTAT) 0.4 MG SL tablet Place 1 tablet (0.4 mg total) under the tongue every 5 (five) minutes as needed for chest pain.  . pantoprazole (PROTONIX) 40 MG tablet TAKE 1 TABLET(40 MG) BY MOUTH DAILY  . tadalafil (CIALIS) 20 MG tablet Take 0.5-1 tablets (10-20 mg total) by mouth every other day as needed for erectile dysfunction.  . [DISCONTINUED] docusate sodium (COLACE) 100 MG capsule Take 100 mg by mouth as needed for mild constipation.   . [DISCONTINUED] fluticasone (FLONASE)  50 MCG/ACT nasal spray Place 2 sprays into both nostrils daily.  . eszopiclone (LUNESTA) 1 MG TABS tablet Take 1 tablet (1 mg total) by mouth at bedtime as needed for sleep. Take immediately before bedtime  . montelukast (SINGULAIR) 10 MG tablet Take 1 tablet (10 mg total) by mouth at bedtime.   No facility-administered encounter medications on file as of 04/30/2019.    Allergies as of 04/30/2019 - Review Complete 04/30/2019  Allergen Reaction Noted  . Plavix [clopidogrel bisulfate] Shortness Of Breath 02/24/2016  . Oxycontin [oxycodone hcl] Other (See Comments) 05/14/2012  . Penicillins Other (See Comments) 04/30/2013  . Pioglitazone Other (See Comments) 04/30/2013  . Prednisone Other (See Comments) 07/14/2011  . Simvastatin Other (See Comments) 05/14/2012    Past Medical History:  Diagnosis Date  . 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 Arthritis    "lower back" (07/29/2016)  . ASCVD (arteriosclerotic cardiovascular disease)    -luminal irregularities in 1998 and 2005; normal EF  . Benign prostatic hypertrophy    status post transurethral resection of the prostate  . CAD (coronary artery disease)    a. Cath 07/29/16 99% ostial RCA & 70% pRCA s/p cutting balloon angioplasty and single SYNERGY DES 2.5X28. 100% very Distal 1st RPLB lesion --> the occlusion site moved further distal with guidewire advancement;  40% ostial LAD; 50% Ost Cx to Prox Cx lesion.  . Coronary artery disease   . Dysphagia   . Erectile dysfunction   . GERD (gastroesophageal reflux disease)   . Heart murmur   . History of hiatal hernia   . History of kidney stones   . Hyperlipidemia   . Hypertension   . Seasonal allergies   . Tobacco abuse    20 pack years; discontinued 1995  . Type II diabetes mellitus ([place])     Past Surgical History:  Procedure Laterality Date  . APPENDECTOMY    . CATARACT EXTRACTION W/ INTRAOCULAR LENS  IMPLANT, BILATERAL Bilateral   . COLONOSCOPY  07/20/2011   Dr. Gala Romney: multiple  hyperplastic polyps. Surveillance 2018  . COLONOSCOPY N/A 06/30/2016   Procedure: COLONOSCOPY;  Surgeon: Daneil Dolin, MD;  Location: [facility];  Service: Endoscopy;  Laterality: N/A;  10:00am  . COLONOSCOPY W/ BIOPSIES AND POLYPECTOMY  07/08/08, 06/2011   Dr Madolyn Frieze papilla, diminutive rectal polyp ablated the, left-sided diverticula, piecemeal polypectomy of hepatic flexure adenomatous polyp, diminutive polyps near hepatic flexure ablated  . CORONARY ANGIOPLASTY WITH STENT PLACEMENT  07/29/2016  . CORONARY STENT INTERVENTION N/A 07/29/2016   Procedure: Coronary Stent Intervention;  Surgeon: Leonie Man, MD;  Location: [facility];  Service: Cardiovascular;  Laterality: N/A;  RCA  . ESOPHAGOGASTRODUODENOSCOPY  2013   erosive reflux esophagitis, s/p empiric dilation. chronic duodenitis.   [name] LEFT HEART CATH AND CORONARY ANGIOGRAPHY N/A 07/29/2016   Procedure: Left Heart Cath and Coronary Angiography;  Surgeon: Glenetta Hew  W, MD;  Location: [facility];  Service: Cardiovascular;  Laterality: N/A;  . NASAL SEPTUM SURGERY    . NASAL SINUS SURGERY     "cleaned out my sinus"  . PERIPHERAL VASCULAR CATHETERIZATION N/A 11/26/2015   Procedure: Abdominal Aortogram w/Lower Extremity;  Surgeon: Wellington Hampshire, MD;  Location: [facility];  Service: Cardiovascular;  Laterality: N/A;  . TONSILLECTOMY    . TRANSURETHRAL RESECTION OF PROSTATE  2009    Family History  Problem Relation Age of Onset  . Ovarian cancer Mother   . Colon cancer Sister 63       deceased from colon cancer    Social History   Socioeconomic History  . Marital status: Married    Spouse name: Not on file  . Number of children: 2  . Years of education: Not on file  . Highest education level: Not on file  Occupational History  . Occupation: Dietitian, 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    Comment: [employer]  Tobacco Use  . Smoking status: Former Smoker    Packs/day: 1.00     Years: 15.00    Pack years: 15.00    Types: Cigarettes    Start date: 03/08/1965    Quit date: 07/07/1993    Years since quitting: 25.8  . Smokeless tobacco: Never Used  Substance and Sexual Activity  . Alcohol use: No    Alcohol/week: 0.0 standard drinks    Comment: 07/29/2016 "last drink was 4 wks ago; had a beer"  . Drug use: No  . Sexual activity: Not Currently  Other Topics Concern  . Not on file  Social History Narrative   5 grandchildren    Social Determinants of Health   Financial Resource Strain:   . Difficulty of Paying Living Expenses: Not on file  Food Insecurity:   . Worried About Charity fundraiser in the Last Year: Not on file  . Ran Out of Food in the Last Year: Not on file  Transportation Needs:   . Lack of Transportation (Medical): Not on file  . Lack of Transportation (Non-Medical): Not on file  Physical Activity:   . Days of Exercise per Week: Not on file  . Minutes of Exercise per Session: Not on file  Stress:   . Feeling of Stress : Not on file  Social Connections:   . Frequency of Communication with Friends and Family: Not on file  . Frequency of Social Gatherings with Friends and Family: Not on file  . Attends Religious Services: Not on file  . Active Member of Clubs or Organizations: Not on file  . Attends Archivist Meetings: Not on file  . Marital Status: Not on file  Intimate Partner Violence:   . Fear of Current or Ex-Partner: Not on file  . Emotionally Abused: Not on file  . Physically Abused: Not on file  . Sexually Abused: Not on file    Review of Systems  Constitutional: Negative.  Negative for unexpected weight change.  HENT: Positive for postnasal drip.        Sinus problems, nasal stuffiness, runny nose  Eyes: Negative.   Respiratory: Positive for apnea. Negative for cough and shortness of breath.        Significant history of snoring  Cardiovascular: Negative.   Gastrointestinal: Negative.   Psychiatric/Behavioral:  Positive for sleep disturbance.    Vitals:   04/30/19 1351  BP: 138/62  Pulse: 87  Temp: 98.7 F (37.1 C)  SpO2: 95%   Physical Exam  Constitutional: He appears well-developed and well-nourished. No distress.  HENT:  Head: Normocephalic and atraumatic.  Eyes: Pupils are equal, round, and reactive to light. Conjunctivae are normal. Right eye exhibits no discharge. Left eye exhibits no discharge.  Neck: No tracheal deviation present. No thyromegaly present.  Cardiovascular: Normal rate and regular rhythm.  Pulmonary/Chest: Effort normal. No respiratory distress. He has no wheezes. He has no rales.  Abdominal: Soft. Bowel sounds are normal. He exhibits no distension. There is no abdominal tenderness.  Musculoskeletal:     Cervical back: Normal range of motion and neck supple.  Skin: He is not diaphoretic.  Psychiatric: He has a normal mood and affect.   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 -States this is affecting his quality of life and would like to try a sleep aid -We will try Lunesta, -He says he has tried multiple agents in the past that have not helped  Multiple awakenings at night/nocturia -The awakenings may be on account of sleep disordered breathing -Associated nocturia because he is already awake versus nocturia been more common in patients with untreated sleep disordered breathing  Chronic rhinitis, this may be contributing to his chronic cough -Continue Atrovent nasal -Add Singulair and Flonase  Plan/Recommendations:  Continue use of Requip  Lunesta 1 mg nightly  Singulair and Flonase  Obtain pulmonary function tests to assess for obstructive lung disease   Call with any significant concerns  We will follow-up in about 6 weeks  Sherrilyn Rist MD [facility] 04/30/2019, 2:15 PM  CC: [name], PA-C

## 2019-05-07 ENCOUNTER — Telehealth: Payer: Self-pay | Admitting: Physician Assistant

## 2019-05-07 DIAGNOSIS — M545 Low back pain, unspecified: Secondary | ICD-10-CM

## 2019-05-07 DIAGNOSIS — M25559 Pain in unspecified hip: Secondary | ICD-10-CM

## 2019-05-07 NOTE — Telephone Encounter (Signed)
Pt called in asking for a referral to [facility] for lower back and hip pain. He states that he and Einar Pheasant talked about doing this at his wellness visit in Jan. Please advise.

## 2019-05-07 NOTE — Telephone Encounter (Signed)
Per PCP ok to place referral to [facility].

## 2019-05-07 NOTE — Telephone Encounter (Signed)
Ok to place referral.

## 2019-06-11 ENCOUNTER — Ambulatory Visit (INDEPENDENT_AMBULATORY_CARE_PROVIDER_SITE_OTHER): Payer: Medicare PPO | Admitting: Ophthalmology

## 2019-06-11 ENCOUNTER — Encounter (INDEPENDENT_AMBULATORY_CARE_PROVIDER_SITE_OTHER): Payer: Self-pay | Admitting: Ophthalmology

## 2019-06-11 ENCOUNTER — Other Ambulatory Visit: Payer: Self-pay

## 2019-06-11 DIAGNOSIS — E113312 Type 2 diabetes mellitus with moderate nonproliferative diabetic retinopathy with macular edema, left eye: Secondary | ICD-10-CM | POA: Diagnosis not present

## 2019-06-11 DIAGNOSIS — E113393 Type 2 diabetes mellitus with moderate nonproliferative diabetic retinopathy without macular edema, bilateral: Secondary | ICD-10-CM | POA: Insufficient documentation

## 2019-06-11 DIAGNOSIS — H43823 Vitreomacular adhesion, bilateral: Secondary | ICD-10-CM | POA: Insufficient documentation

## 2019-06-11 DIAGNOSIS — H43822 Vitreomacular adhesion, left eye: Secondary | ICD-10-CM

## 2019-06-11 DIAGNOSIS — E113311 Type 2 diabetes mellitus with moderate nonproliferative diabetic retinopathy with macular edema, right eye: Secondary | ICD-10-CM | POA: Diagnosis not present

## 2019-06-11 DIAGNOSIS — H3561 Retinal hemorrhage, right eye: Secondary | ICD-10-CM

## 2019-06-11 DIAGNOSIS — E113411 Type 2 diabetes mellitus with severe nonproliferative diabetic retinopathy with macular edema, right eye: Secondary | ICD-10-CM | POA: Insufficient documentation

## 2019-06-11 DIAGNOSIS — H43821 Vitreomacular adhesion, right eye: Secondary | ICD-10-CM

## 2019-06-11 NOTE — Assessment & Plan Note (Signed)
The nature of moderate nonproliferative diabetic retinopathy was discussed with the patient as well as the need for more frequent follow up to judge for progression. Good blood glucose, blood pressure, and serum lipid control was recommended as well as avoidance of smoking and maintenance of normal weight.  Close follow up with PCP encouraged. °

## 2019-06-11 NOTE — Assessment & Plan Note (Signed)
Vitreomacular traction may cause vision loss from anatomic distortion to the center of the vision, the macula.  If visual function is symptomatic or threatened, therapy may be needed.  Surgical intervention offers the highest chance of visual stability and improvement.  Distortion of the macula anatomy may cause splitting of the retinal layers, termed foveomacular retinoschisis, which can cause more permanent vision loss.  Epiretinal membranes may also be associated.  Macular hole may also develop if vitreomacular traction progresses. The minor form of this condition is Vitreomacular adhesion, which is a natural change in the aging process of the eye, which requires observation only. 

## 2019-06-14 ENCOUNTER — Other Ambulatory Visit: Payer: Self-pay

## 2019-06-14 ENCOUNTER — Other Ambulatory Visit: Payer: Self-pay | Admitting: Emergency Medicine

## 2019-06-14 DIAGNOSIS — I10 Essential (primary) hypertension: Secondary | ICD-10-CM

## 2019-06-14 DIAGNOSIS — E119 Type 2 diabetes mellitus without complications: Secondary | ICD-10-CM

## 2019-06-14 MED ORDER — LOSARTAN POTASSIUM 25 MG PO TABS: ORAL_TABLET | ORAL | 1 refills | Status: DC

## 2019-06-14 MED ORDER — LANTUS SOLOSTAR 100 UNIT/ML ~~LOC~~ SOPN: 22.0000 [IU] | PEN_INJECTOR | Freq: Every day | SUBCUTANEOUS | 3 refills | Status: DC

## 2019-06-14 MED ORDER — LANTUS SOLOSTAR 100 UNIT/ML ~~LOC~~ SOPN: 26.0000 [IU] | PEN_INJECTOR | Freq: Every day | SUBCUTANEOUS | 3 refills | Status: DC

## 2019-06-19 ENCOUNTER — Other Ambulatory Visit: Payer: Self-pay

## 2019-06-19 ENCOUNTER — Telehealth: Payer: Self-pay | Admitting: Physician Assistant

## 2019-06-19 MED ORDER — SITAGLIPTIN PHOSPHATE 100 MG PO TABS: 100.0000 mg | ORAL_TABLET | Freq: Every day | ORAL | 1 refills | Status: DC

## 2019-06-19 MED ORDER — AMLODIPINE BESYLATE 5 MG PO TABS: 5.0000 mg | ORAL_TABLET | Freq: Every day | ORAL | 1 refills | Status: DC

## 2019-06-19 NOTE — Telephone Encounter (Signed)
Pt called in asking for a refill on the amlodipine and Januvia, pt uses [facility] in [place].

## 2019-06-19 NOTE — Telephone Encounter (Signed)
Refills sent to pharmacy. 

## 2019-07-03 ENCOUNTER — Other Ambulatory Visit: Payer: Self-pay | Admitting: Emergency Medicine

## 2019-07-03 DIAGNOSIS — E119 Type 2 diabetes mellitus without complications: Secondary | ICD-10-CM

## 2019-07-03 MED ORDER — ACCU-CHEK AVIVA PLUS VI STRP: ORAL_STRIP | 6 refills | Status: DC

## 2019-07-09 ENCOUNTER — Other Ambulatory Visit: Payer: Self-pay | Admitting: Emergency Medicine

## 2019-07-09 DIAGNOSIS — E119 Type 2 diabetes mellitus without complications: Secondary | ICD-10-CM

## 2019-07-09 MED ORDER — METFORMIN HCL 1000 MG PO TABS: 1000.0000 mg | ORAL_TABLET | Freq: Two times a day (BID) | ORAL | 1 refills | Status: DC

## 2019-07-13 ENCOUNTER — Other Ambulatory Visit (HOSPITAL_COMMUNITY)
Admission: RE | Admit: 2019-07-13 | Discharge: 2019-07-13 | Disposition: A | Discharge status: Home / Self Care | Payer: Medicare PPO | Source: Ambulatory Visit | Attending: Pulmonary Disease | Admitting: Pulmonary Disease

## 2019-07-13 DIAGNOSIS — Z20822 Contact with and (suspected) exposure to covid-19: Secondary | ICD-10-CM | POA: Diagnosis not present

## 2019-07-13 DIAGNOSIS — Z01812 Encounter for preprocedural laboratory examination: Secondary | ICD-10-CM | POA: Diagnosis present

## 2019-07-13 LAB — SARS CORONAVIRUS 2 (TAT 6-24 HRS): SARS Coronavirus 2: NEGATIVE

## 2019-07-17 ENCOUNTER — Other Ambulatory Visit: Payer: Self-pay

## 2019-07-17 ENCOUNTER — Ambulatory Visit (INDEPENDENT_AMBULATORY_CARE_PROVIDER_SITE_OTHER): Payer: Medicare PPO | Admitting: Pulmonary Disease

## 2019-07-17 ENCOUNTER — Ambulatory Visit: Payer: Medicare PPO | Admitting: Adult Health

## 2019-07-17 DIAGNOSIS — R05 Cough: Secondary | ICD-10-CM

## 2019-07-17 DIAGNOSIS — R059 Cough, unspecified: Secondary | ICD-10-CM

## 2019-07-17 LAB — PULMONARY FUNCTION TEST
DL/VA % pred: 111 %
DL/VA: 4.39 ml/min/mmHg/L
DLCO cor % pred: 80 %
DLCO cor: 17.69 ml/min/mmHg
DLCO unc % pred: 80 %
DLCO unc: 17.69 ml/min/mmHg
FEF 25-75 Post: 1.79 L/sec
FEF 25-75 Pre: 1.01 L/sec
FEF2575-%Change-Post: 76 %
FEF2575-%Pred-Post: 108 %
FEF2575-%Pred-Pre: 61 %
FEV1-%Change-Post: 16 %
FEV1-%Pred-Post: 74 %
FEV1-%Pred-Pre: 63 %
FEV1-Post: 1.81 L
FEV1-Pre: 1.55 L
FEV1FVC-%Change-Post: -1 %
FEV1FVC-%Pred-Pre: 100 %
FEV6-%Change-Post: 16 %
FEV6-%Pred-Post: 78 %
FEV6-%Pred-Pre: 67 %
FEV6-Post: 2.53 L
FEV6-Pre: 2.16 L
FEV6FVC-%Change-Post: -1 %
FEV6FVC-%Pred-Post: 106 %
FEV6FVC-%Pred-Pre: 108 %
FVC-%Change-Post: 18 %
FVC-%Pred-Post: 73 %
FVC-%Pred-Pre: 62 %
FVC-Post: 2.56 L
FVC-Pre: 2.16 L
Post FEV1/FVC ratio: 71 %
Post FEV6/FVC ratio: 99 %
Pre FEV1/FVC ratio: 72 %
Pre FEV6/FVC Ratio: 100 %
RV % pred: 122 %
RV: 3.01 L
TLC % pred: 78 %
TLC: 5.01 L

## 2019-07-17 NOTE — Progress Notes (Signed)
Full PFT performed today. °

## 2019-07-20 ENCOUNTER — Telehealth: Payer: Self-pay | Admitting: Pulmonary Disease

## 2019-07-20 NOTE — Telephone Encounter (Signed)
Called and spoke with pt who is requesting to know the results of PFT which was performed 5/25. Dr. Jenetta Downer, please advise.

## 2019-07-24 NOTE — Telephone Encounter (Signed)
PFT revealed mild obstructive airway disease with significant bronchodilator response mostly affecting the small airways  The significant bronchodilator response does suggest the patient will benefit from bronchodilators long-term

## 2019-07-24 NOTE — Telephone Encounter (Signed)
I called pt Marcus Norton and there was a busy signal. Will try again later.

## 2019-07-25 ENCOUNTER — Other Ambulatory Visit: Payer: Self-pay | Admitting: Emergency Medicine

## 2019-07-25 DIAGNOSIS — K219 Gastro-esophageal reflux disease without esophagitis: Secondary | ICD-10-CM

## 2019-07-25 MED ORDER — PANTOPRAZOLE SODIUM 40 MG PO TBEC: DELAYED_RELEASE_TABLET | ORAL | 1 refills | Status: DC

## 2019-07-25 NOTE — Telephone Encounter (Signed)
Called and spoke with pt letting him know the results of PFT and pt verbalized understanding. Pt did not have a f/u scheduled with AO so I have scheduled pt a f/u with AO 7/6 at 11:30.  Due to mention of bronchodilators long-term will be beneficial, pt wanted to know if something was going to be prescribed for pt or if this was to wait until OV to further discuss. Dr. Jenetta Downer, please advise.

## 2019-07-25 NOTE — Telephone Encounter (Signed)
Pt called back, please return call  

## 2019-07-25 NOTE — Telephone Encounter (Signed)
ATC pt, no answer. Left message for pt to call back.  

## 2019-07-26 ENCOUNTER — Other Ambulatory Visit: Payer: Self-pay | Admitting: Pulmonary Disease

## 2019-07-26 MED ORDER — FLUTICASONE FUROATE-VILANTEROL 100-25 MCG/INH IN AEPB: 1.0000 | INHALATION_SPRAY | Freq: Every day | RESPIRATORY_TRACT | 2 refills | Status: DC

## 2019-07-26 NOTE — Telephone Encounter (Signed)
Breo sent in to pharmacy

## 2019-07-26 NOTE — Telephone Encounter (Signed)
Called and spoke with pt letting him know that AO sent Breo inhaler to pharmacy for him and he verbalized understanding. Nothing further needed.

## 2019-08-02 ENCOUNTER — Other Ambulatory Visit (HOSPITAL_COMMUNITY): Payer: Self-pay | Admitting: Cardiovascular Disease

## 2019-08-02 ENCOUNTER — Other Ambulatory Visit: Payer: Self-pay

## 2019-08-02 ENCOUNTER — Ambulatory Visit (HOSPITAL_COMMUNITY)
Admission: RE | Admit: 2019-08-02 | Discharge: 2019-08-02 | Disposition: A | Discharge status: Home / Self Care | Payer: Medicare PPO | Source: Ambulatory Visit | Attending: Internal Medicine | Admitting: Internal Medicine

## 2019-08-02 ENCOUNTER — Ambulatory Visit (HOSPITAL_BASED_OUTPATIENT_CLINIC_OR_DEPARTMENT_OTHER)
Admission: RE | Admit: 2019-08-02 | Discharge: 2019-08-02 | Disposition: A | Discharge status: Home / Self Care | Payer: Medicare PPO | Source: Ambulatory Visit | Attending: Cardiovascular Disease | Admitting: Cardiovascular Disease

## 2019-08-02 DIAGNOSIS — Z95828 Presence of other vascular implants and grafts: Secondary | ICD-10-CM

## 2019-08-02 DIAGNOSIS — I739 Peripheral vascular disease, unspecified: Secondary | ICD-10-CM

## 2019-08-03 ENCOUNTER — Other Ambulatory Visit: Payer: Self-pay

## 2019-08-03 ENCOUNTER — Emergency Department (HOSPITAL_COMMUNITY)
Admission: EM | Admit: 2019-08-03 | Discharge: 2019-08-04 | Disposition: A | Discharge status: Home / Self Care | Payer: Medicare PPO | Attending: Emergency Medicine | Admitting: Emergency Medicine

## 2019-08-03 ENCOUNTER — Encounter (HOSPITAL_COMMUNITY): Payer: Self-pay

## 2019-08-03 DIAGNOSIS — Z79899 Other long term (current) drug therapy: Secondary | ICD-10-CM | POA: Diagnosis not present

## 2019-08-03 DIAGNOSIS — Z87891 Personal history of nicotine dependence: Secondary | ICD-10-CM | POA: Insufficient documentation

## 2019-08-03 DIAGNOSIS — E113313 Type 2 diabetes mellitus with moderate nonproliferative diabetic retinopathy with macular edema, bilateral: Secondary | ICD-10-CM | POA: Insufficient documentation

## 2019-08-03 DIAGNOSIS — R600 Localized edema: Secondary | ICD-10-CM | POA: Diagnosis not present

## 2019-08-03 DIAGNOSIS — Z7982 Long term (current) use of aspirin: Secondary | ICD-10-CM | POA: Diagnosis not present

## 2019-08-03 DIAGNOSIS — I2510 Atherosclerotic heart disease of native coronary artery without angina pectoris: Secondary | ICD-10-CM | POA: Diagnosis not present

## 2019-08-03 DIAGNOSIS — I10 Essential (primary) hypertension: Secondary | ICD-10-CM | POA: Diagnosis not present

## 2019-08-03 DIAGNOSIS — R42 Dizziness and giddiness: Secondary | ICD-10-CM | POA: Diagnosis not present

## 2019-08-03 DIAGNOSIS — Z7984 Long term (current) use of oral hypoglycemic drugs: Secondary | ICD-10-CM | POA: Diagnosis not present

## 2019-08-03 DIAGNOSIS — Z7901 Long term (current) use of anticoagulants: Secondary | ICD-10-CM | POA: Insufficient documentation

## 2019-08-03 LAB — BASIC METABOLIC PANEL
Anion gap: 13 (ref 5–15)
BUN: 25 mg/dL — ABNORMAL HIGH (ref 8–23)
CO2: 25 mmol/L (ref 22–32)
Calcium: 9 mg/dL (ref 8.9–10.3)
Chloride: 100 mmol/L (ref 98–111)
Creatinine, Ser: 0.84 mg/dL (ref 0.61–1.24)
GFR calc Af Amer: >60 mL/min (ref >60)
GFR calc non Af Amer: >60 mL/min (ref >60)
Glucose, Bld: 254 mg/dL — ABNORMAL HIGH (ref 70–99)
Potassium: 3.8 mmol/L (ref 3.5–5.1)
Sodium: 138 mmol/L (ref 135–145)

## 2019-08-03 LAB — TROPONIN I (HIGH SENSITIVITY)
Troponin I (High Sensitivity): 8 ng/L (ref <18)
Troponin I (High Sensitivity): 8 ng/L (ref <18)

## 2019-08-03 LAB — CBC
HCT: 38.3 % — ABNORMAL LOW (ref 39.0–52.0)
Hemoglobin: 12.8 g/dL — ABNORMAL LOW (ref 13.0–17.0)
MCH: 34.9 pg — ABNORMAL HIGH (ref 26.0–34.0)
MCHC: 33.4 g/dL (ref 30.0–36.0)
MCV: 104.4 fL — ABNORMAL HIGH (ref 80.0–100.0)
Platelets: 247 10*3/uL (ref 150–400)
RBC: 3.67 MIL/uL — ABNORMAL LOW (ref 4.22–5.81)
RDW: 13.2 % (ref 11.5–15.5)
WBC: 9.9 10*3/uL (ref 4.0–10.5)
nRBC: 0 % (ref 0.0–0.2)

## 2019-08-03 NOTE — ED Triage Notes (Addendum)
Pt from home with complains of a dizzy spell where he also "blacked out" said he has had a few spells in the last month. Also complains of a mild headache. Goes to see his cardiologist the 22nd . Had sonogram yesterday.

## 2019-08-04 ENCOUNTER — Emergency Department (HOSPITAL_COMMUNITY): Payer: Medicare PPO

## 2019-08-04 LAB — URINALYSIS, ROUTINE W REFLEX MICROSCOPIC
Bacteria, UA: NONE SEEN
Bilirubin Urine: NEGATIVE
Glucose, UA: >=500 mg/dL — AB
Hgb urine dipstick: NEGATIVE
Ketones, ur: NEGATIVE mg/dL
Leukocytes,Ua: NEGATIVE
Nitrite: NEGATIVE
Protein, ur: NEGATIVE mg/dL
Specific Gravity, Urine: 1.012 (ref 1.005–1.030)
pH: 5 (ref 5.0–8.0)

## 2019-08-04 MED ORDER — MECLIZINE HCL 12.5 MG PO TABS
12.5000 mg | ORAL_TABLET | Freq: Three times a day (TID) | ORAL | 0 refills | Status: DC | PRN
Start: 2019-08-04 — End: 2019-08-28

## 2019-08-04 MED ORDER — IOHEXOL 350 MG/ML SOLN
100.0000 mL | Freq: Once | INTRAVENOUS | Status: AC | PRN
  Administered 2019-08-04: 100 mL via INTRAVENOUS

## 2019-08-04 NOTE — ED Provider Notes (Addendum)
[facility] EMERGENCY DEPARTMENT Provider Note   CSN: [id number] Arrival date & time: 08/03/19  1017     History Chief Complaint  Patient presents with  . Dizziness    Marcus Norton is a 79 y.o. male.  Patient with history of CAD, aortic stenosis, diabetes, hypertension hyperlipidemia presenting with episode of dizziness and near syncope.  Patient states she was watching TV this evening when he has sudden onset of room spinning dizziness that lasted for 2 or 3 seconds and resolved.  He felt like he was going to pass out but did not.  Did not fall or hit his head.  Not complaining of a mild headache that was gradual in onset.  No focal weakness, numbness or tingling.  No difficulty speaking or difficulty swallowing.  No chest pain or shortness of breath.  He did have a dizzy spell earlier in the week according to his wife as well butdid not seek treatment and it was not as severe as tonight.  He has a known murmur from aortic stenosis.  He feels like he is back to normal now.  He no longer feels dizzy.  Never did have chest pain or shortness of breath.  No vomiting, diarrhea, cough or fever.  No visual changes.  The history is provided by the patient and a relative.  Dizziness Associated symptoms: headaches and weakness   Associated symptoms: no chest pain, no nausea and no shortness of breath        Past Medical History:  Diagnosis Date  . 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 Arthritis    "lower back" (07/29/2016)  . ASCVD (arteriosclerotic cardiovascular disease)    -luminal irregularities in 1998 and 2005; normal EF  . Benign prostatic hypertrophy    status post transurethral resection of the prostate  . CAD (coronary artery disease)    a. Cath 07/29/16 99% ostial RCA & 70% pRCA s/p cutting balloon angioplasty and single SYNERGY DES 2.5X28. 100% very Distal 1st RPLB lesion --> the occlusion site moved further distal with guidewire advancement;  40% ostial LAD; 50% Ost Cx to Prox  Cx lesion.  . Coronary artery disease   . Dysphagia   . Erectile dysfunction   . GERD (gastroesophageal reflux disease)   . Heart murmur   . History of hiatal hernia   . History of kidney stones   . Hyperlipidemia   . Hypertension   . Seasonal allergies   . Tobacco abuse    20 pack years; discontinued 1995  . Type II diabetes mellitus [facility])     Patient Active Problem List   Diagnosis Date Noted  . 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 Moderate nonproliferative diabetic retinopathy of right eye with macular edema ([place]) 06/11/2019  . Retinal hemorrhage of right eye 06/11/2019  . 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 Vitreomacular adhesion of right eye 06/11/2019  . Chronic rhinitis 09/11/2018  . OSA (obstructive sleep apnea) 07/12/2018  . Restless leg syndrome 07/12/2018  . PSVT (paroxysmal supraventricular tachycardia) ([place]) 03/10/2018  . Visit for preventive health examination 03/10/2018  . Atypical chest pain 03/10/2018  . Dysphagia 03/10/2018  . Chronic cough 12/20/2017  . CAD S/P percutaneous coronary angioplasty 07/20/2017  . Exertional dyspnea 07/15/2017  . GERD (gastroesophageal reflux disease) 04/14/2017  . Hematuria 03/08/2017  . Encounter for Medicare annual wellness exam 03/08/2017  . Need for pneumococcal vaccination 03/08/2017  .  Status post coronary artery stent placement   . Atypical angina ([place]) 07/29/2016  . Abnormal nuclear stress test 07/29/2016  . Angina pectoris ([place])   . Constipation 05/12/2016  . History of adenomatous polyp of colon 05/12/2016  . Generalized anxiety disorder 03/04/2016  . PAD (peripheral artery disease) ([place]) 02/26/2014  . Arteriosclerotic cardiovascular disease -nonobstructive   . Insulin dependent diabetes mellitus with complications   . Essential hypertension   . History of tobacco abuse   . Hyperlipidemia  07/11/2009  . 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 Abnormal liver function tests 07/11/2009    Past Surgical History:  Procedure Laterality Date  . APPENDECTOMY    . CATARACT EXTRACTION W/ INTRAOCULAR LENS  IMPLANT, BILATERAL Bilateral   . COLONOSCOPY  07/20/2011   Dr. Gala Romney: multiple hyperplastic polyps. Surveillance 2018  . COLONOSCOPY N/A 06/30/2016   Procedure: COLONOSCOPY;  Surgeon: Daneil Dolin, MD;  Location: [facility];  Service: Endoscopy;  Laterality: N/A;  10:00am  . COLONOSCOPY W/ BIOPSIES AND POLYPECTOMY  07/08/08, 06/2011   Dr Madolyn Frieze papilla, diminutive rectal polyp ablated the, left-sided diverticula, piecemeal polypectomy of hepatic flexure adenomatous polyp, diminutive polyps near hepatic flexure ablated  . CORONARY ANGIOPLASTY WITH STENT PLACEMENT  07/29/2016  . CORONARY STENT INTERVENTION N/A 07/29/2016   Procedure: Coronary Stent Intervention;  Surgeon: Leonie Man, MD;  Location: [place] [facility] LAB;  Service: Cardiovascular;  Laterality: N/A;  RCA  . ESOPHAGOGASTRODUODENOSCOPY  2013   erosive reflux esophagitis, s/p empiric dilation. chronic duodenitis.   [name] LEFT HEART CATH AND CORONARY ANGIOGRAPHY N/A 07/29/2016   Procedure: Left Heart Cath and Coronary Angiography;  Surgeon: Leonie Man, MD;  Location: [place] [facility];  Service: Cardiovascular;  Laterality: N/A;  . NASAL SEPTUM SURGERY    . NASAL SINUS SURGERY     "cleaned out my sinus"  . PERIPHERAL VASCULAR CATHETERIZATION N/A 11/26/2015   Procedure: Abdominal Aortogram w/Lower Extremity;  Surgeon: Wellington Hampshire, MD;  Location: [place] [facility];  Service: Cardiovascular;  Laterality: N/A;  . TONSILLECTOMY    . TRANSURETHRAL RESECTION OF PROSTATE  2009       Family History  Problem Relation Age of Onset  . Ovarian cancer Mother   . Colon cancer Sister 18       deceased from colon cancer    Social History   Tobacco Use  . Smoking status: Former Smoker    Packs/day: 1.00     Years: 15.00    Pack years: 15.00    Types: Cigarettes    Start date: 03/08/1965    Quit date: 07/07/1993    Years since quitting: 26.0  . Smokeless tobacco: Never Used  Vaping Use  . Vaping Use: Never used  Substance Use Topics  . Alcohol use: No    Alcohol/week: 0.0 standard drinks    Comment: 07/29/2016 "last drink was 4 wks ago; had a beer"  . Drug use: No    Home Medications Prior to Admission medications   Medication Sig Start Date End Date Taking? Authorizing Provider  alfuzosin (UROXATRAL) 10 MG 24 hr tablet Take 10 mg by mouth daily with breakfast.   Yes [provider]  amLODipine (NORVASC) 5 MG tablet Take 1 tablet (5 mg total) by mouth daily. 06/19/19  Yes [name], PA-C  aspirin EC 81 MG tablet Take 81 mg by mouth at bedtime.    Yes [provider]  atorvastatin (LIPITOR) 10 MG tablet Take 1 tablet (10 mg  total) by mouth once a week. Patient taking differently: Take 10 mg by mouth every Tuesday.  09/11/18  Yes Wellington Hampshire, MD  Evolocumab (REPATHA SURECLICK) 841 MG/ML SOAJ Inject 140 mg into the skin every 14 (fourteen) days. 09/19/18  Yes Wellington Hampshire, MD  fluticasone furoate-vilanterol (BREO ELLIPTA) 100-25 MCG/INH AEPB Inhale 1 puff into the lungs daily. 07/26/19  Yes Olalere, Adewale A, MD  LANTUS SOLOSTAR 100 UNIT/ML Solostar Pen Inject 26 Units into the skin daily. Patient taking differently: Inject 28 Units into the skin at bedtime.  06/14/19  Yes [name], PA-C  losartan (COZAAR) 25 MG tablet TAKE 1 TABLET(25 MG) BY MOUTH DAILY Patient taking differently: Take 25 mg by mouth in the morning.  06/14/19  Yes [name], PA-C  metFORMIN (GLUCOPHAGE) 1000 MG tablet Take 1 tablet (1,000 mg total) by mouth 2 (two) times daily with a meal. 07/09/19  Yes [name], PA-C  metoprolol tartrate (LOPRESSOR) 25 MG tablet TAKE 1 TABLET(25 MG) BY MOUTH TWICE DAILY Patient taking differently: Take 25 mg by mouth 2 (two) times daily.   11/10/18  Yes Herminio Commons, MD  montelukast (SINGULAIR) 10 MG tablet Take 1 tablet (10 mg total) by mouth at bedtime. 04/30/19  Yes Olalere, Adewale A, MD  MULTIPLE VITAMIN PO Take 1 tablet by mouth daily.   Yes [provider]  nitroGLYCERIN (NITROSTAT) 0.4 MG SL tablet Place 1 tablet (0.4 mg total) under the tongue every 5 (five) minutes as needed for chest pain. 07/03/18  Yes [name], PA-C  pantoprazole (PROTONIX) 40 MG tablet TAKE 1 TABLET(40 MG) BY MOUTH DAILY Patient taking differently: Take 40 mg by mouth in the morning. TAKE 1 TABLET(40 MG) BY MOUTH DAILY 07/25/19  Yes [name], PA-C  sitaGLIPtin (JANUVIA) 100 MG tablet Take 1 tablet (100 mg total) by mouth daily. Patient taking differently: Take 100 mg by mouth in the morning.  06/19/19  Yes [name], PA-C  tadalafil (CIALIS) 20 MG tablet Take 0.5-1 tablets (10-20 mg total) by mouth every other day as needed for erectile dysfunction. 03/08/17  Yes [name], PA-C  eszopiclone (LUNESTA) 1 MG TABS tablet Take 1 tablet (1 mg total) by mouth at bedtime as needed for sleep. Take immediately before bedtime Patient not taking: Reported on 08/03/2019 04/30/19   Laurin Coder, MD    Allergies    Plavix [clopidogrel bisulfate], Oxycontin [oxycodone hcl], Penicillins, Pioglitazone, Prednisone, and Simvastatin  Review of Systems   Review of Systems  Constitutional: Negative for activity change, appetite change, fatigue and fever.  HENT: Negative for congestion and rhinorrhea.   Respiratory: Negative for cough, chest tightness and shortness of breath.   Cardiovascular: Positive for leg swelling. Negative for chest pain.  Gastrointestinal: Negative for abdominal pain and nausea.  Genitourinary: Negative for dysuria and hematuria.  Musculoskeletal: Negative for arthralgias and myalgias.  Skin: Negative for rash.  Neurological: Positive for dizziness, weakness, light-headedness and headaches. Negative  for numbness.   all other systems are negative except as noted in the HPI and PMH.    Physical Exam Updated Vital Signs BP (!) 148/59   Pulse 62   Temp 97.9 F (36.6 C)   Resp 14   Ht 5\' 7"  (1.702 m)   Wt 78.9 kg   SpO2 94%   BMI 27.25 kg/m   Physical Exam Vitals and nursing note reviewed.  Constitutional:      General: He is not in  acute distress.    Appearance: He is well-developed.  HENT:     Head: Normocephalic and atraumatic.     Mouth/Throat:     Pharynx: No oropharyngeal exudate.  Eyes:     Conjunctiva/sclera: Conjunctivae normal.     Pupils: Pupils are equal, round, and reactive to light.  Neck:     Comments: No meningismus. Cardiovascular:     Rate and Rhythm: Normal rate and regular rhythm.     Heart sounds: Murmur heard.      Comments: Systolic murmur Pulmonary:     Effort: Pulmonary effort is normal. No respiratory distress.     Breath sounds: Normal breath sounds.  Abdominal:     Palpations: Abdomen is soft.     Tenderness: There is no abdominal tenderness. There is no guarding or rebound.  Musculoskeletal:        General: No tenderness. Normal range of motion.     Cervical back: Normal range of motion and neck supple.     Right lower leg: Edema present.     Left lower leg: Edema present.  Skin:    General: Skin is warm.  Neurological:     Mental Status: He is alert and oriented to person, place, and time.     Cranial Nerves: No cranial nerve deficit.     Motor: No abnormal muscle tone.     Coordination: Coordination normal.     Comments: No ataxia on finger to nose bilaterally. No pronator drift. 5/5 strength throughout. CN 2-12 intact.Equal grip strength. Sensation intact.  No ataxia on finger-to-nose.  Negative Romberg.  Normal gait.  Psychiatric:        Behavior: Behavior normal.     ED Results / Procedures / Treatments   Labs (all labs ordered are listed, but only abnormal results are displayed) Labs Reviewed  BASIC METABOLIC PANEL -  Abnormal; Notable for the following components:      Result Value   Glucose, Bld 254 (*)    BUN 25 (*)    All other components within normal limits  CBC - Abnormal; Notable for the following components:   RBC 3.67 (*)    Hemoglobin 12.8 (*)    HCT 38.3 (*)    MCV 104.4 (*)    MCH 34.9 (*)    All other components within normal limits  URINALYSIS, ROUTINE W REFLEX MICROSCOPIC - Abnormal; Notable for the following components:   Glucose, UA >=500 (*)    All other components within normal limits  TROPONIN I (HIGH SENSITIVITY)  TROPONIN I (HIGH SENSITIVITY)    EKG EKG Interpretation  Date/Time:  Friday August 03 2019 20:15:18 EDT Ventricular Rate:  75 PR Interval:  154 QRS Duration: 108 QT Interval:  392 QTC Calculation: 437 R Axis:   -39 Text Interpretation: Sinus rhythm with Premature atrial complexes Left axis deviation Incomplete right bundle branch block Moderate voltage criteria for LVH, may be normal variant ( R in aVL , Cornell product ) Septal infarct , age undetermined Abnormal ECG Nonspecific T wave abnormality Confirmed by Ezequiel Essex [phone]) on 08/04/2019 12:01:44 AM   Radiology CT Angio Head W or Wo Contrast  Result Date: 08/04/2019 CLINICAL DATA:  Vertigo and headache EXAM: CT ANGIOGRAPHY HEAD AND NECK TECHNIQUE: Multidetector CT imaging of the head and neck was performed using the standard protocol during bolus administration of intravenous contrast. Multiplanar CT image reconstructions and MIPs were obtained to evaluate the vascular anatomy. Carotid stenosis measurements (when applicable) are obtained utilizing NASCET criteria,  using the distal internal carotid diameter as the denominator. CONTRAST:  1101mL OMNIPAQUE IOHEXOL 350 MG/ML SOLN COMPARISON:  None. FINDINGS: CT HEAD FINDINGS Brain: There is no mass, hemorrhage or extra-axial collection. The size and configuration of the ventricles and extra-axial CSF spaces are normal. There is no acute or chronic infarction.  The brain parenchyma is normal. Skull: The visualized skull base, calvarium and extracranial soft tissues are normal. Sinuses/Orbits: No fluid levels or advanced mucosal thickening of the visualized paranasal sinuses. No mastoid or middle ear effusion. The orbits are normal. CTA NECK FINDINGS SKELETON: There is no bony spinal canal stenosis. No lytic or blastic lesion. OTHER NECK: Normal pharynx, larynx and major salivary glands. No cervical lymphadenopathy. Unremarkable thyroid gland. UPPER CHEST: No pneumothorax or pleural effusion. No nodules or masses. AORTIC ARCH: There is no calcific atherosclerosis of the aortic arch. There is no aneurysm, dissection or hemodynamically significant stenosis of the visualized portion of the aorta. Conventional 3 vessel aortic branching pattern. The visualized proximal subclavian arteries are widely patent. RIGHT CAROTID SYSTEM: No dissection, occlusion or aneurysm. There is calcified atherosclerosis extending into the proximal ICA, resulting in 50% stenosis. LEFT CAROTID SYSTEM: No dissection, occlusion or aneurysm. There is calcified atherosclerosis extending into the proximal ICA, resulting in 60% stenosis. VERTEBRAL ARTERIES: Right dominant configuration. Both origins are clearly patent. There is no dissection, occlusion or flow-limiting stenosis to the skull base (V1-V3 segments). CTA HEAD FINDINGS POSTERIOR CIRCULATION: --Vertebral arteries: Normal V4 segments. --Inferior cerebellar arteries: Normal. --Basilar artery: Normal. --Superior cerebellar arteries: Normal. --Posterior cerebral arteries (PCA): Normal. ANTERIOR CIRCULATION: --Intracranial internal carotid arteries: Normal. --Anterior cerebral arteries (ACA): Normal. Both A1 segments are present. Patent anterior communicating artery (a-comm). --Middle cerebral arteries (MCA): Normal. VENOUS SINUSES: As permitted by contrast timing, patent. ANATOMIC VARIANTS: None Review of the MIP images confirms the above findings.  IMPRESSION: 1. No emergent large vessel occlusion or high-grade stenosis of the intracranial arteries. 2. Bilateral proximal internal carotid artery stenosis measuring 50 % on the right and 60 % on the left. Electronically Signed   By: Ulyses Jarred M.D.   On: 08/04/2019 03:22   CT Angio Neck W and/or Wo Contrast  Result Date: 08/04/2019 CLINICAL DATA:  Vertigo and headache EXAM: CT ANGIOGRAPHY HEAD AND NECK TECHNIQUE: Multidetector CT imaging of the head and neck was performed using the standard protocol during bolus administration of intravenous contrast. Multiplanar CT image reconstructions and MIPs were obtained to evaluate the vascular anatomy. Carotid stenosis measurements (when applicable) are obtained utilizing NASCET criteria, using the distal internal carotid diameter as the denominator. CONTRAST:  155mL OMNIPAQUE IOHEXOL 350 MG/ML SOLN COMPARISON:  None. FINDINGS: CT HEAD FINDINGS Brain: There is no mass, hemorrhage or extra-axial collection. The size and configuration of the ventricles and extra-axial CSF spaces are normal. There is no acute or chronic infarction. The brain parenchyma is normal. Skull: The visualized skull base, calvarium and extracranial soft tissues are normal. Sinuses/Orbits: No fluid levels or advanced mucosal thickening of the visualized paranasal sinuses. No mastoid or middle ear effusion. The orbits are normal. CTA NECK FINDINGS SKELETON: There is no bony spinal canal stenosis. No lytic or blastic lesion. OTHER NECK: Normal pharynx, larynx and major salivary glands. No cervical lymphadenopathy. Unremarkable thyroid gland. UPPER CHEST: No pneumothorax or pleural effusion. No nodules or masses. AORTIC ARCH: There is no calcific atherosclerosis of the aortic arch. There is no aneurysm, dissection or hemodynamically significant stenosis of the visualized portion of the aorta. Conventional 3 vessel aortic branching  pattern. The visualized proximal subclavian arteries are widely  patent. RIGHT CAROTID SYSTEM: No dissection, occlusion or aneurysm. There is calcified atherosclerosis extending into the proximal ICA, resulting in 50% stenosis. LEFT CAROTID SYSTEM: No dissection, occlusion or aneurysm. There is calcified atherosclerosis extending into the proximal ICA, resulting in 60% stenosis. VERTEBRAL ARTERIES: Right dominant configuration. Both origins are clearly patent. There is no dissection, occlusion or flow-limiting stenosis to the skull base (V1-V3 segments). CTA HEAD FINDINGS POSTERIOR CIRCULATION: --Vertebral arteries: Normal V4 segments. --Inferior cerebellar arteries: Normal. --Basilar artery: Normal. --Superior cerebellar arteries: Normal. --Posterior cerebral arteries (PCA): Normal. ANTERIOR CIRCULATION: --Intracranial internal carotid arteries: Normal. --Anterior cerebral arteries (ACA): Normal. Both A1 segments are present. Patent anterior communicating artery (a-comm). --Middle cerebral arteries (MCA): Normal. VENOUS SINUSES: As permitted by contrast timing, patent. ANATOMIC VARIANTS: None Review of the MIP images confirms the above findings. IMPRESSION: 1. No emergent large vessel occlusion or high-grade stenosis of the intracranial arteries. 2. Bilateral proximal internal carotid artery stenosis measuring 50 % on the right and 60 % on the left. Electronically Signed   By: Ulyses Jarred M.D.   On: 08/04/2019 03:22   VAS [place] ABI WITH/WO TBI  Result Date: 08/02/2019 LOWER EXTREMITY DOPPLER STUDY Indications: Peripheral artery disease s/p bilateral common iliac stent              placement in 2017. Patient says his legs feel pretty good overall,              occasionally they'll get some weakness/heaviness but he denies              worsening claudication symptoms at this time. High Risk Factors: Hypertension, hyperlipidemia, Diabetes, past history of                    smoking, coronary artery disease. Other Factors: Known right SFA occlusion and moderate left SFA disease.   Vascular Interventions: Bilateral common iliac artery kissing stent placement in                         10/17. Comparison       Previous ABIs in 6/20 were 0.53 on the right and 0.79 on the Study:           left. Performing Technologist: Mariane Masters RVT  Examination Guidelines: A complete evaluation includes at minimum, Doppler waveform signals and systolic blood pressure reading at the level of bilateral brachial, anterior tibial, and posterior tibial arteries, when vessel segments are accessible. Bilateral testing is considered an integral part of a complete examination. Photoelectric Plethysmograph (PPG) waveforms and toe systolic pressure readings are included as required and additional duplex testing as needed. Limited examinations for reoccurring indications may be performed as noted.  ABI Findings: +---------+------------------+-----+----------+---------+ Right    Rt Pressure (mmHg)IndexWaveform  Comment   +---------+------------------+-----+----------+---------+ Brachial 156                                        +---------+------------------+-----+----------+---------+ ATA      116               0.74 monophasic          +---------+------------------+-----+----------+---------+ PTA                             absent    inaudible +---------+------------------+-----+----------+---------+  PERO     109               0.70 monophasic          +---------+------------------+-----+----------+---------+ Great Toe44                0.28 Abnormal            +---------+------------------+-----+----------+---------+ +---------+------------------+-----+----------+-------+ Left     Lt Pressure (mmHg)IndexWaveform  Comment +---------+------------------+-----+----------+-------+ Brachial 148                                      +---------+------------------+-----+----------+-------+ ATA      122               0.78 monophasic         +---------+------------------+-----+----------+-------+ PTA      131               0.84 monophasic        +---------+------------------+-----+----------+-------+ PERO     107               0.69 monophasic        +---------+------------------+-----+----------+-------+ Great Toe56                0.36 Abnormal          +---------+------------------+-----+----------+-------+ +-------+-----------+-----------+------------+------------+ ABI/TBIToday's ABIToday's TBIPrevious ABIPrevious TBI +-------+-----------+-----------+------------+------------+ Right  0.74       0.28       0.53        0            +-------+-----------+-----------+------------+------------+ Left   0.84       0.36       0.79        0.46         +-------+-----------+-----------+------------+------------+ Right ABIs and TBIs appear increased compared to prior study on 08/02/18. Left ABIs appear essentially unchanged compared to prior study on 08/02/18.  Summary: Right: Resting right ankle-brachial index indicates moderate right lower extremity arterial disease. The right toe-brachial index is abnormal. Left: Resting left ankle-brachial index indicates mild left lower extremity arterial disease. The left toe-brachial index is abnormal.  *See table(s) above for measurements and observations. See aorta-iliac duplex report.  Suggest follow up study in 12 months. Electronically signed by Ida Rogue MD on 08/02/2019 at 5:27:16 PM.    Final    VAS US AORTA/IVC/ILIACS  Result Date: 08/02/2019 ABDOMINAL AORTA STUDY Indications: Peripheral artery disease s/p bilateral common iliac stent              placement in 2017. Patient says his legs feel pretty good overall,              occasionally they'll get some weakness/heaviness but he denies              worsening claudication symptoms at this time Risk Factors: Hypertension, hyperlipidemia, Diabetes, past history of smoking,               coronary artery disease. Other  Factors: Known right SFA occlusion and moderate left SFA disease.                 Today's ABIs are 0.74 on the right and 0.84 on the left. Vascular Interventions: Bilateral common iliac artery kissing stent placement in                         10/17. Limitations:  Air/bowel gas.  Comparison Study: Previous aorta-iliac duplex in 6/20 was technically difficult                   and showed <50% stenosis of the right common and external                   iliac arteries, >50% stenosis (low-end) of the left common                   iliac artery and <50% stenosis of the left external iliac                   artery. Performing Technologist: Mariane Masters RVT  Examination Guidelines: A complete evaluation includes B-mode imaging, spectral Doppler, color Doppler, and power Doppler as needed of all accessible portions of each vessel. Bilateral testing is considered an integral part of a complete examination. Limited examinations for reoccurring indications may be performed as noted.  Abdominal Aorta Findings: +-------------+-------+----------+----------+--------+--------+--------+ Location     AP (cm)Trans (cm)PSV (cm/s)WaveformThrombusComments +-------------+-------+----------+----------+--------+--------+--------+ Proximal     1.90   1.90      99        biphasic                 +-------------+-------+----------+----------+--------+--------+--------+ Mid          1.80   1.80      67        biphasic                 +-------------+-------+----------+----------+--------+--------+--------+ Distal       1.70   1.60      77        biphasic                 +-------------+-------+----------+----------+--------+--------+--------+ RT CIA Prox  1.0    0.9       78        biphasic                 +-------------+-------+----------+----------+--------+--------+--------+ RT CIA Mid                    120       biphasic                  +-------------+-------+----------+----------+--------+--------+--------+ RT CIA Distal                 154       biphasic                 +-------------+-------+----------+----------+--------+--------+--------+ RT EIA Prox  0.8    0.8       86        biphasic                 +-------------+-------+----------+----------+--------+--------+--------+ RT EIA Mid                    68        biphasic                 +-------------+-------+----------+----------+--------+--------+--------+ RT EIA Distal                 72        biphasic                 +-------------+-------+----------+----------+--------+--------+--------+ LT CIA Prox  1.0    1.0       160       biphasic                 +-------------+-------+----------+----------+--------+--------+--------+  LT CIA Mid                    188       biphasic                 +-------------+-------+----------+----------+--------+--------+--------+ LT CIA Distal                 191       biphasic                 +-------------+-------+----------+----------+--------+--------+--------+ LT EIA Prox  0.9    0.9       117       biphasic                 +-------------+-------+----------+----------+--------+--------+--------+ LT EIA Mid                    94        biphasic                 +-------------+-------+----------+----------+--------+--------+--------+ LT EIA Distal                 105       biphasic                 +-------------+-------+----------+----------+--------+--------+--------+ Visualization of the Left CIA Distal artery was limited. Atherosclerosis throughout the abdominal aorta and bilateral common and external iliac arteries. Unable to visualized the bilateral common iliac stent struts accurately therefore all velocities are  listed in the native table. IVC/Iliac Findings: +--------+------+--------+--------+   IVC   PatentThrombusComments +--------+------+--------+--------+ IVC  Proxpatent                 +--------+------+--------+--------+   Summary: Abdominal Aorta: No evidence of an abdominal aortic aneurysm was visualized. The largest aortic measurement is 2.0 cm. Stenosis: +--------------------+-------------+--------------+-----------------+ Location            Stenosis     Stent         Comments          +--------------------+-------------+--------------+-----------------+ Right Common Iliac               1-49% stenosis                  +--------------------+-------------+--------------+-----------------+ Left Common Iliac                1-49% stenosishigh-end of range +--------------------+-------------+--------------+-----------------+ Right External Iliac<50% stenosis                                +--------------------+-------------+--------------+-----------------+ Left External Iliac <50% stenosis                                +--------------------+-------------+--------------+-----------------+  *See table(s) above for measurements and observations. See ABI report. Suggest follow up study in 12 months.  Electronically signed by Ida Rogue MD on 08/02/2019 at 5:31:34 PM.    Final     Procedures Procedures (including critical care time)  Medications Ordered in ED Medications - No data to display  ED Course  I have reviewed the triage vital signs and the nursing notes.  Pertinent labs & imaging results that were available during my care of the patient were reviewed by me and considered in my medical decision making (see chart for details).    MDM Rules/Calculators/A&P  Episode of dizziness described as room spinning that has since resolved.  No focal neurological deficits.  No chest pain or shortness of breath.  EKG shows sinus rhythm without Brugada or prolonged QT. ST changes on EKG today are similar to June 2020.  Neurological exam is nonfocal.  CT head is negative.  Does not show any large vessel  occlusion or aneurysm.  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  Patient able to ambulate without difficulty or dizziness.  Low suspicion for acute CVA. Troponin negative x2.  EKG is unchanged.  CTA shows no large vessel occlusion or aneurysm.  Patient tolerating p.o. and ambulatory.  No further episodes of dizziness or lightheadedness.  Orthostatics are negative. Troponin negative x2.  Low suspicion for ACS, PE, or dissection.  Low suspicion for acute CVA given brief episode of symptoms and lack of recurrence.  Patent vertebrobasilar circulation on CTA.  Description of symptoms suspicious for vertigo.  We will trial meclizine.  Patient has follow-up with his PCP and cardiologist next week.  Informed of the carotid stenosis and need for follow-up. Low suspicion for cardiogenic syncope.  No arrhythmias noted throughout ED course.  Observation admission discussed with patient but he is feeling well and wants to go home. Seems reasonable as he is reliable and feels improved and has had a reassuring work-up so far. Instructed to return to the ED if he has recurrent dizzy spells, chest pain, shortness of breath, focal neurological deficits or any other concerns. Final Clinical Impression(s) / ED Diagnoses Final diagnoses:  Vertigo    Rx / DC Orders ED Discharge Orders    None       Avalynne Diver, Annie Main, MD 08/04/19 3734    Ezequiel Essex, MD 08/04/19 0451

## 2019-08-04 NOTE — Discharge Instructions (Signed)
As we discussed I suspect you are having vertigo.  We do not suspect stroke at this time.  Your heart enzymes and EKG are reassuring.  Follow-up with your primary doctor as well as her cardiologist.  You do have narrowing of the carotid arteries in your neck which needs to be monitored.  Return to the ED with recurrent dizziness, chest pain, shortness of breath, difficulty speaking, difficulty swallowing, focal weakness, numbness or tingling or other concerns.

## 2019-08-04 NOTE — ED Notes (Signed)
Pt tolerated PO fluids and ambulated in hallway without complication. MD notified.

## 2019-08-04 NOTE — ED Notes (Signed)
ED Provider at bedside. 

## 2019-08-07 ENCOUNTER — Other Ambulatory Visit: Payer: Self-pay

## 2019-08-07 ENCOUNTER — Encounter: Payer: Self-pay | Admitting: Physician Assistant

## 2019-08-07 ENCOUNTER — Ambulatory Visit: Payer: Medicare PPO | Admitting: Physician Assistant

## 2019-08-07 VITALS — BP 124/60 | HR 62 | Temp 97.2°F | Ht 67.0 in | Wt 179.8 lb

## 2019-08-07 DIAGNOSIS — E119 Type 2 diabetes mellitus without complications: Secondary | ICD-10-CM

## 2019-08-07 DIAGNOSIS — I739 Peripheral vascular disease, unspecified: Secondary | ICD-10-CM

## 2019-08-07 DIAGNOSIS — R42 Dizziness and giddiness: Secondary | ICD-10-CM

## 2019-08-07 DIAGNOSIS — I6523 Occlusion and stenosis of bilateral carotid arteries: Secondary | ICD-10-CM | POA: Diagnosis not present

## 2019-08-07 LAB — CBC WITH DIFFERENTIAL/PLATELET
Basophils Absolute: 0.1 10*3/uL (ref 0.0–0.1)
Basophils Relative: 0.7 % (ref 0.0–3.0)
Eosinophils Absolute: 0.4 10*3/uL (ref 0.0–0.7)
Eosinophils Relative: 3.9 % (ref 0.0–5.0)
HCT: 40 % (ref 39.0–52.0)
Hemoglobin: 13.6 g/dL (ref 13.0–17.0)
Lymphocytes Relative: 16.9 % (ref 12.0–46.0)
Lymphs Abs: 1.5 10*3/uL (ref 0.7–4.0)
MCHC: 34.1 g/dL (ref 30.0–36.0)
MCV: 101.6 fl — ABNORMAL HIGH (ref 78.0–100.0)
Monocytes Absolute: 0.5 10*3/uL (ref 0.1–1.0)
Monocytes Relative: 5 % (ref 3.0–12.0)
Neutro Abs: 6.7 10*3/uL (ref 1.4–7.7)
Neutrophils Relative %: 73.5 % (ref 43.0–77.0)
Platelets: 247 10*3/uL (ref 150.0–400.0)
RBC: 3.93 Mil/uL — ABNORMAL LOW (ref 4.22–5.81)
RDW: 13.3 % (ref 11.5–15.5)
WBC: 9.1 10*3/uL (ref 4.0–10.5)

## 2019-08-07 LAB — HEMOGLOBIN A1C: Hgb A1c MFr Bld: 8.7 % — ABNORMAL HIGH (ref 4.6–6.5)

## 2019-08-07 LAB — LIPID PANEL
Cholesterol: 71 mg/dL (ref 0–200)
HDL: 35.3 mg/dL — ABNORMAL LOW (ref >39.00)
LDL Cholesterol: 22 mg/dL (ref 0–99)
NonHDL: 35.92
Total CHOL/HDL Ratio: 2
Triglycerides: 70 mg/dL (ref 0.0–149.0)
VLDL: 14 mg/dL (ref 0.0–40.0)

## 2019-08-07 LAB — TSH: TSH: 3.17 u[IU]/mL (ref 0.35–4.50)

## 2019-08-07 NOTE — Patient Instructions (Signed)
Please go to the lab today for blood work.  I will call you with your results. We will alter treatment regimen(s) if indicated by your results.   Please keep well-hydrated and eat a well-balanced diet. Do not skip meals. For the next week I want you to also check glucose levels each night before bed and anytime you notice symptoms.   I want you to follow-up next week with Dr. Fletcher Anon as scheduled for further management of peripheral arterial disease and to follow-up regarding your carotids.   Start the exercises below. I am going to work on getting you back in with your ENT provider as I feel episodes are related to your inner-ear/vestibular system.   I am going to speak with the pharmacist here to get you set up for a medication review. He will reach out to you to schedule a phone or in-office visit with you.   Hang in there!  How to Perform the Epley Maneuver The Epley maneuver is an exercise that relieves symptoms of vertigo. Vertigo is the feeling that you or your surroundings are moving when they are not. When you feel vertigo, you may feel like the room is spinning and have trouble walking. Dizziness is a little different than vertigo. When you are dizzy, you may feel unsteady or light-headed. You can do this maneuver at home whenever you have symptoms of vertigo. You can do it up to 3 times a day until your symptoms go away. Even though the Epley maneuver may relieve your vertigo for a few weeks, it is possible that your symptoms will return. This maneuver relieves vertigo, but it does not relieve dizziness. What are the risks? If it is done correctly, the Epley maneuver is considered safe. Sometimes it can lead to dizziness or nausea that goes away after a short time. If you develop other symptoms, such as changes in vision, weakness, or numbness, stop doing the maneuver and call your health care provider. How to perform the Epley maneuver 1. Sit on the edge of a bed or table with your  back straight and your legs extended or hanging over the edge of the bed or table. 2. Turn your head halfway toward the affected ear or side. 3. Lie backward quickly with your head turned until you are lying flat on your back. You may want to position a pillow under your shoulders. 4. Hold this position for 30 seconds. You may experience an attack of vertigo. This is normal. 5. Turn your head to the opposite direction until your unaffected ear is facing the floor. 6. Hold this position for 30 seconds. You may experience an attack of vertigo. This is normal. Hold this position until the vertigo stops. 7. Turn your whole body to the same side as your head. Hold for another 30 seconds. 8. Sit back up. You can repeat this exercise up to 3 times a day. Follow these instructions at home:  After doing the Epley maneuver, you can return to your normal activities.  Ask your health care provider if there is anything you should do at home to prevent vertigo. He or she may recommend that you: ? Keep your head raised (elevated) with two or more pillows while you sleep. ? Do not sleep on the side of your affected ear. ? Get up slowly from bed. ? Avoid sudden movements during the day. ? Avoid extreme head movement, like looking up or bending over. Contact a health care provider if:  Your vertigo gets  worse.  You have other symptoms, including: ? Nausea. ? Vomiting. ? Headache. Get help right away if:  You have vision changes.  You have a severe or worsening headache or neck pain.  You cannot stop vomiting.  You have new numbness or weakness in any part of your body. Summary  Vertigo is the feeling that you or your surroundings are moving when they are not.  The Epley maneuver is an exercise that relieves symptoms of vertigo.  If the Epley maneuver is done correctly, it is considered safe. You can do it up to 3 times a day. This information is not intended to replace advice given to you by  your health care provider. Make sure you discuss any questions you have with your health care provider. Document Revised: 01/21/2017 Document Reviewed: 12/30/2015 Elsevier Patient Education  2020 [employer].

## 2019-08-07 NOTE — Progress Notes (Signed)
Patient presents to clinic today for ER follow-up.  Patient presented to the emergency room on 08/03/2019 after having an episode of dizziness and near syncope while watching TV.  ER work-up and included examination that was unremarkable, labs revealing mild elevation of glucose at 254, BUN 25, hemoglobin 12.8.  Troponins negative.  EKG obtained revealing sinus rhythm at rate of 75 bpm with premature atrial complexes, left axis deviation, incomplete right bundle branch block; relatively unchanged from prior tracings.  Orthostatic vitals obtained and negative.  CT angiogram of head and neck performed revealing no emergent large vessel occlusion or high-grade stenosis; bilateral proximal internal carotid artery stenosis, 50% on right and 60% on the left.  Patient had a vascular ultrasound of aorta and iliacs as well as ABI the day before by cardiology.  This revealed moderate right lower extremity arterial disease, mild left lower extremity arterial disease.  A repeat study was recommended in 12 months.  As work-up overall unremarkable, patient stable and no recurrence of symptoms, patient felt safe to discharge home.  Symptoms felt to be related to vertigo.  Patient started on a trial of meclizine and encouraged to follow-up with primary care and cardiology.  Since discharge patient endorses doing well overall.  Has had a couple of other episodes where he feels lightheaded/dizzy, mainly with positional change and turning head.  Denies any chest pain, shortness of breath, racing heart.  Denies true spinning of the room.  Denies syncope.  Denies headache or vision changes.  Has continued on medications as directed.  Notes glucose has been averaging 150-170 each morning.  Has not checked glucose level when symptoms are present.  Has been keeping a stable, well-balanced diet.  Does not skip meals.  Endorses keeping well hydrated.  Denies new symptoms or concerns today.  Patient does have follow-up scheduled with  cardiology (Dr. Fletcher Anon) next week.   Past Medical History:  Diagnosis Date  . 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 Arthritis    "lower back" (07/29/2016)  . ASCVD (arteriosclerotic cardiovascular disease)    -luminal irregularities in 1998 and 2005; normal EF  . Benign prostatic hypertrophy    status post transurethral resection of the prostate  . CAD (coronary artery disease)    a. Cath 07/29/16 99% ostial RCA & 70% pRCA s/p cutting balloon angioplasty and single SYNERGY DES 2.5X28. 100% very Distal 1st RPLB lesion --> the occlusion site moved further distal with guidewire advancement;  40% ostial LAD; 50% Ost Cx to Prox Cx lesion.  . Coronary artery disease   . Dysphagia   . Erectile dysfunction   . GERD (gastroesophageal reflux disease)   . Heart murmur   . History of hiatal hernia   . History of kidney stones   . Hyperlipidemia   . Hypertension   . Seasonal allergies   . Tobacco abuse    20 pack years; discontinued 1995  . Type II diabetes mellitus ([place])     Current Outpatient Medications on File Prior to Visit  Medication Sig Dispense Refill  . alfuzosin (UROXATRAL) 10 MG 24 hr tablet Take 10 mg by mouth daily with breakfast.    . amLODipine (NORVASC) 5 MG tablet Take 1 tablet (5 mg total) by mouth daily. 90 tablet 1  . aspirin EC 81 MG tablet Take 81 mg by mouth at bedtime.     [name] atorvastatin (LIPITOR) 10 MG tablet Take 1 tablet (10 mg total) by mouth once a week. (Patient taking differently: Take 10  mg by mouth every Tuesday. ) 90 tablet 1  . Evolocumab (REPATHA SURECLICK) 662 MG/ML SOAJ Inject 140 mg into the skin every 14 (fourteen) days. 2 pen 12  . fluticasone furoate-vilanterol (BREO ELLIPTA) 100-25 MCG/INH AEPB Inhale 1 puff into the lungs daily. 60 each 2  . LANTUS SOLOSTAR 100 UNIT/ML Solostar Pen Inject 26 Units into the skin daily. (Patient taking differently: Inject 28 Units into the skin at bedtime. ) 15 mL 3  . losartan (COZAAR) 25 MG tablet TAKE 1 TABLET(25 MG)  BY MOUTH DAILY (Patient taking differently: Take 25 mg by mouth in the morning. ) 90 tablet 1  . meclizine (ANTIVERT) 12.5 MG tablet Take 1 tablet (12.5 mg total) by mouth 3 (three) times daily as needed for dizziness. 30 tablet 0  . metFORMIN (GLUCOPHAGE) 1000 MG tablet Take 1 tablet (1,000 mg total) by mouth 2 (two) times daily with a meal. 180 tablet 1  . metoprolol tartrate (LOPRESSOR) 25 MG tablet TAKE 1 TABLET(25 MG) BY MOUTH TWICE DAILY (Patient taking differently: Take 25 mg by mouth 2 (two) times daily. ) 180 tablet 3  . montelukast (SINGULAIR) 10 MG tablet Take 1 tablet (10 mg total) by mouth at bedtime. 30 tablet 11  . MULTIPLE VITAMIN PO Take 1 tablet by mouth daily.    . nitroGLYCERIN (NITROSTAT) 0.4 MG SL tablet Place 1 tablet (0.4 mg total) under the tongue every 5 (five) minutes as needed for chest pain. 5 tablet 0  . pantoprazole (PROTONIX) 40 MG tablet TAKE 1 TABLET(40 MG) BY MOUTH DAILY (Patient taking differently: Take 40 mg by mouth in the morning. TAKE 1 TABLET(40 MG) BY MOUTH DAILY) 90 tablet 1  . sitaGLIPtin (JANUVIA) 100 MG tablet Take 1 tablet (100 mg total) by mouth daily. (Patient taking differently: Take 100 mg by mouth in the morning. ) 90 tablet 1  . tadalafil (CIALIS) 20 MG tablet Take 0.5-1 tablets (10-20 mg total) by mouth every other day as needed for erectile dysfunction. 5 tablet 1   No current facility-administered medications on file prior to visit.    Allergies  Allergen Reactions  . Plavix [Clopidogrel Bisulfate] Shortness Of Breath  . Oxycontin [Oxycodone Hcl] Other (See Comments)    Malaise  . Penicillins Other (See Comments)    Cotton Mouth   . Pioglitazone Other (See Comments)    Made his chest hurt  . Prednisone Other (See Comments)    Reaction: muscle aches and soreness  . Simvastatin Other (See Comments)    Myalgias    Family History  Problem Relation Age of Onset  . Ovarian cancer Mother   . Colon cancer Sister 9       deceased from  colon cancer    Social History   Socioeconomic History  . Marital status: Married    Spouse name: Not on file  . Number of children: 2  . Years of education: Not on file  . Highest education level: Not on file  Occupational History  . Occupation: Dietitian, 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    Comment: [employer]  Tobacco Use  . Smoking status: Former Smoker    Packs/day: 1.00    Years: 15.00    Pack years: 15.00    Types: Cigarettes    Start date: 03/08/1965    Quit date: 07/07/1993    Years since quitting: 26.1  . Smokeless tobacco: Never Used  Vaping Use  . Vaping Use: Never used  Substance and Sexual Activity  .  Alcohol use: No    Alcohol/week: 0.0 standard drinks    Comment: 07/29/2016 "last drink was 4 wks ago; had a beer"  . Drug use: No  . Sexual activity: Not Currently  Other Topics Concern  . Not on file  Social History Narrative   5 grandchildren    Social Determinants of Health   Financial Resource Strain:   . Difficulty of Paying Living Expenses:   Food Insecurity:   . Worried About Charity fundraiser in the Last Year:   . Arboriculturist in the Last Year:   Transportation Needs:   . Film/video editor (Medical):   [name] Lack of Transportation (Non-Medical):   Physical Activity:   . Days of Exercise per Week:   . Minutes of Exercise per Session:   Stress:   . Feeling of Stress :   Social Connections:   . Frequency of Communication with Friends and Family:   . Frequency of Social Gatherings with Friends and Family:   . Attends Religious Services:   . Active Member of Clubs or Organizations:   . Attends Archivist Meetings:   [name] Marital Status:     Review of Systems - See HPI.  All other ROS are negative.  BP 124/60   Pulse 62   Temp (!) 97.2 F (36.2 C)   Ht 5\' 7"  (1.702 m)   Wt 179 lb 12.8 oz (81.6 kg)   SpO2 96%   BMI 28.16 kg/m   Physical Exam Vitals reviewed.  Constitutional:      Appearance: Normal  appearance.  HENT:     Head: Normocephalic and atraumatic.     Right Ear: Tympanic membrane normal.     Left Ear: Tympanic membrane normal.  Eyes:     Conjunctiva/sclera: Conjunctivae normal.  Cardiovascular:     Rate and Rhythm: Normal rate and regular rhythm.     Pulses: Normal pulses.     Heart sounds: Murmur (Chronic, unchanged) heard.   Pulmonary:     Effort: Pulmonary effort is normal.     Breath sounds: Normal breath sounds.  Musculoskeletal:     Cervical back: Neck supple.  Neurological:     General: No focal deficit present.     Mental Status: He is alert and oriented to person, place, and time.     Cranial Nerves: No cranial nerve deficit.     Motor: No weakness.     Coordination: Coordination normal.  Psychiatric:        Mood and Affect: Mood normal.     Recent Results (from the past 2160 hour(s))  SARS CORONAVIRUS 2 (TAT 6-24 HRS) Nasopharyngeal Nasopharyngeal Swab     Status: None   Collection Time: 07/13/19  8:42 AM   Specimen: Nasopharyngeal Swab  Result Value Ref Range   SARS Coronavirus 2 NEGATIVE NEGATIVE    Comment: (NOTE) SARS-CoV-2 target nucleic acids are NOT DETECTED. The SARS-CoV-2 RNA is generally detectable in upper and lower respiratory specimens during the acute phase of infection. Negative results do not preclude SARS-CoV-2 infection, do not rule out co-infections with other pathogens, and should not be used as the sole basis for treatment or other patient management decisions. Negative results must be combined with clinical observations, patient history, and epidemiological information. The expected result is Negative. Fact Sheet for Patients: SugarRoll.be Fact Sheet for Healthcare Providers: https://www.woods-mathews.com/ This test is not yet approved or cleared by the [place] FDA and  has been authorized for detection  and/or diagnosis of SARS-CoV-2 by FDA under an Emergency Use  Authorization (EUA). This EUA will remain  in effect (meaning this test can be used) for the duration of the COVID-19 declaration under Section 56 4(b)(1) of the Act, 21 U.S.C. section 360bbb-3(b)(1), unless the authorization is terminated or revoked sooner. Performed at [facility], [place] [address] [place], [place] [address]   Pulmonary function test     Status: None   Collection Time: 07/17/19  8:44 AM  Result Value Ref Range   FVC-Pre 2.16 L   FVC-%Pred-Pre 62 %   FVC-Post 2.56 L   FVC-%Pred-Post 73 %   FVC-%Change-Post 18 %   FEV1-Pre 1.55 L   FEV1-%Pred-Pre 63 %   FEV1-Post 1.81 L   FEV1-%Pred-Post 74 %   FEV1-%Change-Post 16 %   FEV6-Pre 2.16 L   FEV6-%Pred-Pre 67 %   FEV6-Post 2.53 L   FEV6-%Pred-Post 78 %   FEV6-%Change-Post 16 %   Pre FEV1/FVC ratio 72 %   FEV1FVC-%Pred-Pre 100 %   Post FEV1/FVC ratio 71 %   FEV1FVC-%Change-Post -1 %   Pre FEV6/FVC Ratio 100 %   FEV6FVC-%Pred-Pre 108 %   Post FEV6/FVC ratio 99 %   FEV6FVC-%Pred-Post 106 %   FEV6FVC-%Change-Post -1 %   FEF 25-75 Pre 1.01 L/sec   FEF2575-%Pred-Pre 61 %   FEF 25-75 Post 1.79 L/sec   FEF2575-%Pred-Post 108 %   FEF2575-%Change-Post 76 %   RV 3.01 L   RV % pred 122 %   TLC 5.01 L   TLC % pred 78 %   DLCO unc 17.69 ml/min/mmHg   DLCO unc % pred 80 %   DLCO cor 17.69 ml/min/mmHg   DLCO cor % pred 80 %   DL/VA 4.39 ml/min/mmHg/L   DL/VA % pred 191 %  Basic metabolic panel     Status: Abnormal   Collection Time: 08/03/19  8:27 PM  Result Value Ref Range   Sodium 138 135 - 145 mmol/L   Potassium 3.8 3.5 - 5.1 mmol/L   Chloride 100 98 - 111 mmol/L   CO2 25 22 - 32 mmol/L   Glucose, Bld 254 (H) 70 - 99 mg/dL    Comment: Glucose reference range applies only to samples taken after fasting for at least 8 hours.   BUN 25 (H) 8 - 23 mg/dL   Creatinine, Ser 0.84 0.61 - 1.24 mg/dL   Calcium 9.0 8.9 - 10.3 mg/dL   GFR calc non Af Amer >60 >60 mL/min   GFR calc Af Amer >60 >60 mL/min   Anion  gap 13 5 - 15    Comment: Performed at [facility], [address], [place], [place] [address]  CBC     Status: Abnormal   Collection Time: 08/03/19  8:27 PM  Result Value Ref Range   WBC 9.9 4.0 - 10.5 K/uL   RBC 3.67 (L) 4.22 - 5.81 MIL/uL   Hemoglobin 12.8 (L) 13.0 - 17.0 g/dL   HCT 38.3 (L) 39 - 52 %   MCV 104.4 (H) 80.0 - 100.0 fL   MCH 34.9 (H) 26.0 - 34.0 pg   MCHC 33.4 30.0 - 36.0 g/dL   RDW 13.2 11.5 - 15.5 %   Platelets 247 150 - 400 K/uL   nRBC 0.0 0.0 - 0.2 %    Comment: Performed at [facility], [address] [place],  [address]  Troponin I (High Sensitivity)     Status: None   Collection Time: 08/03/19  8:54 PM  Result Value Ref Range   Troponin I (High Sensitivity) 8 <18 ng/L    Comment: (NOTE) Elevated high sensitivity troponin I (hsTnI) values and significant  changes across serial measurements may suggest ACS but many other  chronic and acute conditions are known to elevate hsTnI results.  Refer to the "Links" section for chest pain algorithms and additional  guidance. Performed at [facility], [address] [place], [place] [address]   Troponin I (High Sensitivity)     Status: None   Collection Time: 08/03/19 10:46 PM  Result Value Ref Range   Troponin I (High Sensitivity) 8 <18 ng/L    Comment: (NOTE) Elevated high sensitivity troponin I (hsTnI) values and significant  changes across serial measurements may suggest ACS but many other  chronic and acute conditions are known to elevate hsTnI results.  Refer to the "Links" section for chest pain algorithms and additional  guidance. Performed at [facility], [address] [place], [place] [address]   Urinalysis, Routine w reflex microscopic     Status: Abnormal   Collection Time: 08/04/19 12:20 AM  Result Value Ref Range   Color, Urine YELLOW YELLOW   APPearance CLEAR CLEAR   Specific Gravity, Urine 1.012 1.005 - 1.030   pH 5.0 5.0 - 8.0   Glucose, UA >=500 (A) NEGATIVE mg/dL   Hgb urine  dipstick NEGATIVE NEGATIVE   Bilirubin Urine NEGATIVE NEGATIVE   Ketones, ur NEGATIVE NEGATIVE mg/dL   Protein, ur NEGATIVE NEGATIVE mg/dL   Nitrite NEGATIVE NEGATIVE   Leukocytes,Ua NEGATIVE NEGATIVE   RBC / HPF 0-5 0 - 5 RBC/hpf   WBC, UA 0-5 0 - 5 WBC/hpf   Bacteria, UA NONE SEEN NONE SEEN    Comment: Performed at [facility], [address] [place], [place] [address]    Assessment/Plan: 1. Episode of dizziness Significant work-up in the emergency room unremarkable.  Examination today within normal limits.  No focal neurologic findings.  Patient ambulating without difficulty.  Asymptomatic.  Blood pressure remains normotensive.  We will reassess A1c level today.  Encourage patient to check glucose when symptoms are present just to make sure there is no significant drop occurring.  Agree with EDP that this seems to be more related to his vestibular system.  We will have him continue meclizine.  Start home Epley maneuvers.  We will have him follow-up with his ear nose and throat provider.  Strict ER precautions reviewed with patient. - CBC w/Diff - TSH  2. Carotid stenosis, bilateral Internal carotid arteries -50% right, 60% left.  Noncontributory to current symptoms.  We will have him follow-up with cardiology next week as scheduled.  3. PAD (peripheral artery disease) (HCC) Moderate right lower extremity PAD as noted on ABI.  Mild left lower extremity PAD as noted on ABI.  Patient status post iliac artery stents.  Follow-up with Dr. Mariea Clonts as scheduled.  4. Diabetes mellitus without complication ([place]) Taking medications as directed.  Recent slight increase in fasting sugars most likely secondary to injections he has been receiving from orthopedics.  Patient to minimize carbs.  Continue current regimen for now.  We will repeat A1c, lipids.  We will also reassess TSH level today. - Hemoglobin A1c - Lipid Profile - TSH   This visit occurred during the SARS-CoV-2 public health emergency.   Safety protocols were in place, including screening questions prior to the visit, additional usage of staff PPE, and extensive cleaning of exam room while observing appropriate contact time  as indicated for disinfecting solutions.     Leeanne Rio, PA-C

## 2019-08-14 ENCOUNTER — Ambulatory Visit: Payer: Medicare PPO | Admitting: Cardiovascular Disease

## 2019-08-14 ENCOUNTER — Encounter: Payer: Self-pay | Admitting: Cardiovascular Disease

## 2019-08-14 ENCOUNTER — Telehealth: Payer: Self-pay | Admitting: Radiology

## 2019-08-14 ENCOUNTER — Other Ambulatory Visit: Payer: Self-pay

## 2019-08-14 VITALS — BP 142/56 | HR 64 | Ht 67.0 in | Wt 179.8 lb

## 2019-08-14 DIAGNOSIS — E785 Hyperlipidemia, unspecified: Secondary | ICD-10-CM

## 2019-08-14 DIAGNOSIS — I739 Peripheral vascular disease, unspecified: Secondary | ICD-10-CM

## 2019-08-14 DIAGNOSIS — R42 Dizziness and giddiness: Secondary | ICD-10-CM

## 2019-08-14 DIAGNOSIS — I350 Nonrheumatic aortic (valve) stenosis: Secondary | ICD-10-CM | POA: Diagnosis not present

## 2019-08-14 DIAGNOSIS — R002 Palpitations: Secondary | ICD-10-CM

## 2019-08-14 DIAGNOSIS — I2510 Atherosclerotic heart disease of native coronary artery without angina pectoris: Secondary | ICD-10-CM | POA: Diagnosis not present

## 2019-08-14 MED ORDER — AMLODIPINE BESYLATE 2.5 MG PO TABS: 2.5000 mg | ORAL_TABLET | Freq: Every day | ORAL | 1 refills | Status: DC

## 2019-08-14 NOTE — Patient Instructions (Addendum)
Medication Instructions:  DECREASE the Amlodipine to 2.5 mg once daily  *If you need a refill on your cardiac medications before your next appointment, please call your pharmacy*   Lab Work: None ordered If you have labs (blood work) drawn today and your tests are completely normal, you will receive your results only by: [name] MyChart Message (if you have MyChart) OR . A paper copy in the mail If you have any lab test that is abnormal or we need to change your treatment, we will call you to review the results.   Testing/Procedures: Your physician has requested that you have an echocardiogram. Echocardiography is a painless test that uses sound waves to create images of your heart. It provides your doctor with information about the size and shape of your heart and how well your heart's chambers and valves are working. You may receive an ultrasound enhancing agent through an IV if needed to better visualize your heart during the echo.This procedure takes approximately one hour. There are no restrictions for this procedure. This will take place at the [address].   ZIO XT- Long Term Monitor Instructions   Your physician has requested you wear your ZIO patch monitor____14___days.   This is a single patch monitor.  [employer] supplies one patch monitor per enrollment.  Additional stickers are not available.   Please do not apply patch if you will be having a Nuclear Stress Test, Echocardiogram, Cardiac CT, MRI, or Chest Xray during the time frame you would be wearing the monitor. The patch cannot be worn during these tests.  You cannot remove and re-apply the ZIO XT patch monitor.   Your ZIO patch monitor will be sent USPS Priority mail from [facility] directly to your home address. The monitor may also be mailed to a PO BOX if home delivery is not available.   It may take 3-5 days to receive your monitor after you have been enrolled.   Once you have received you monitor,  please review enclosed instructions.  Your monitor has already been registered assigning a specific monitor serial # to you.   Applying the monitor   Shave hair from upper left chest.   Hold abrader disc by orange tab.  Rub abrader in 40 strokes over left upper chest as indicated in your monitor instructions.   Clean area with 4 enclosed alcohol pads .  Use all pads to assure are is cleaned thoroughly.  Let dry.   Apply patch as indicated in monitor instructions.  Patch will be place under collarbone on left side of chest with arrow pointing upward.   Rub patch adhesive wings for 2 minutes.Remove white label marked "1".  Remove white label marked "2".  Rub patch adhesive wings for 2 additional minutes.   While looking in a mirror, press and release button in center of patch.  A small green light will flash 3-4 times .  This will be your only indicator the monitor has been turned on.     Do not shower for the first 24 hours.  You may shower after the first 24 hours.   Press button if you feel a symptom. You will hear a small click.  Record Date, Time and Symptom in the Patient Log Book.   When you are ready to remove patch, follow instructions on last 2 pages of Patient Log Book.  Stick patch monitor onto last page of Patient Log Book.   Place Patient Log Book in [facility]  box.  Use locking tab on box and tape box closed securely.  The [employer] has [employer] on it.  Please place in mailbox as soon as possible.  Your physician should have your test results approximately 7 days after the monitor has been mailed back to [facility].   Call [employer] at [phone] if you have questions regarding your ZIO XT patch monitor.  Call them immediately if you see an orange light blinking on your monitor.   If your monitor falls off in less than 4 days contact our Monitor department at [phone].  If your monitor becomes loose or falls off after 4 days call  [employer] at [phone] for suggestions on securing your monitor.     Follow-Up: At [facility], you and your health needs are our priority.  As part of our continuing mission to provide you with exceptional heart care, we have created designated Provider Care Teams.  These Care Teams include your primary Cardiologist (physician) and Advanced Practice Providers (APPs -  Physician Assistants and Nurse Practitioners) who all work together to provide you with the care you need, when you need it.  We recommend signing up for the patient portal called "MyChart".  Sign up information is provided on this After Visit Summary.  MyChart is used to connect with patients for Virtual Visits (Telemedicine).  Patients are able to view lab/test results, encounter notes, upcoming appointments, etc.  Non-urgent messages can be sent to your provider as well.   To learn more about what you can do with MyChart, go to [email or web address].    Your next appointment:   2 month(s)  The format for your next appointment:   In Person  Provider:   [name], MD

## 2019-08-14 NOTE — Telephone Encounter (Signed)
Enrolled patient for a 14 day Zio monitor to be mailed to patients home.  

## 2019-08-14 NOTE — Progress Notes (Signed)
Cardiology Office Note   Date:  08/15/2019   ID:  Marcus, Norton 04/13/1940, MRN [id number]  PCP:  Marcus Jeans, PA-C  Cardiologist: Dr. Fletcher Norton  No chief complaint on file.     History of Present Illness: Marcus Norton is a 79 y.o. male who presents for a follow-up visit regarding claudication and PAD. He has known history of  paroxysmal supraventricular tachycardia,  coronary artery disease, mild to moderate aortic stenosis, type 2 diabetes since 1990, hypertension and hyperlipidemia. He has known history of claudication . He did not tolerate cilostazol due to palpitations. He is s/p bilateral common iliac artery kissing stent placement in 11/2015. 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   He had abnormal nuclear stress test in 2018.  Cardiac catheterization in June, 2018 showed severe ostial RCA stenosis and occluded RPL2 with nonobstructive disease affecting the left system.  He underwent successful angioplasty and drug-eluting stent placement to the ostial right coronary artery. He also has mild aortic stenosis. He has known history of intolerance to statins and was able to tolerate only small dose of atorvastatin.  He is currently on Repatha.  Recently, he had an episode of dizziness and presyncope while he was watching TV.  He went to the emergency room for evaluation.  Work-up was mostly unremarkable with negative labs and EKG.  He was not orthostatic.  CT angiogram showed moderate bilateral carotid disease. Reports mostly dizziness and lightheadedness which has been random.  Occasionally this is associated with palpitations.  He does feel more fatigued and he gives out easier than before.  No chest pain or shortness of breath. He was started on meclizine by his primary care physician but reports no improvement in symptoms.   Past  Medical History:  Diagnosis Date  . 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 Arthritis    "lower back" (07/29/2016)  . ASCVD (arteriosclerotic cardiovascular disease)    -luminal irregularities in 1998 and 2005; normal EF  . Benign prostatic hypertrophy    status post transurethral resection of the prostate  . CAD (coronary artery disease)    a. Cath 07/29/16 99% ostial RCA & 70% pRCA s/p cutting balloon angioplasty and single SYNERGY DES 2.5X28. 100% very Distal 1st RPLB lesion --> the occlusion site moved further distal with guidewire advancement;  40% ostial LAD; 50% Ost Cx to Prox Cx lesion.  . Coronary artery disease   . Dysphagia   . Erectile dysfunction   . GERD (gastroesophageal reflux disease)   . Heart murmur   . History of hiatal hernia   . History of kidney stones   . Hyperlipidemia   . Hypertension   . Seasonal allergies   . Tobacco abuse    20 pack years; discontinued 1995  . Type II diabetes mellitus ([place])     Past Surgical History:  Procedure Laterality Date  . APPENDECTOMY    . CATARACT EXTRACTION W/ INTRAOCULAR LENS  IMPLANT, BILATERAL Bilateral   . COLONOSCOPY  07/20/2011   Dr. Gala Romney: multiple hyperplastic polyps. Surveillance 2018  . COLONOSCOPY N/A 06/30/2016   Procedure: COLONOSCOPY;  Surgeon: Daneil Dolin, MD;  [facility];  Service: Endoscopy;  Laterality: N/A;  10:00am  . COLONOSCOPY W/ BIOPSIES AND POLYPECTOMY  07/08/08, 06/2011   Dr Marcus Norton papilla, diminutive rectal polyp ablated the, left-sided diverticula, piecemeal polypectomy  of hepatic flexure adenomatous polyp, diminutive polyps near hepatic flexure ablated  . CORONARY ANGIOPLASTY WITH STENT PLACEMENT  07/29/2016  . CORONARY STENT INTERVENTION N/A 07/29/2016   Procedure: Coronary Stent Intervention;  Surgeon: Leonie Man, MD;  Location: [facility];  Service: Cardiovascular;  Laterality: N/A;  RCA  . ESOPHAGOGASTRODUODENOSCOPY  2013   erosive reflux esophagitis, s/p empiric  dilation. chronic duodenitis.   [name] LEFT HEART CATH AND CORONARY ANGIOGRAPHY N/A 07/29/2016   Procedure: Left Heart Cath and Coronary Angiography;  Surgeon: Leonie Man, MD;  Location: [facility];  Service: Cardiovascular;  Laterality: N/A;  . NASAL SEPTUM SURGERY    . NASAL SINUS SURGERY     "cleaned out my sinus"  . PERIPHERAL VASCULAR CATHETERIZATION N/A 11/26/2015   Procedure: Abdominal Aortogram w/Lower Extremity;  Surgeon: Wellington Hampshire, MD;  Location: [facility];  Service: Cardiovascular;  Laterality: N/A;  . TONSILLECTOMY    . TRANSURETHRAL RESECTION OF PROSTATE  2009     Current Outpatient Medications  Medication Sig Dispense Refill  . alfuzosin (UROXATRAL) 10 MG 24 hr tablet Take 10 mg by mouth daily with breakfast.    . amLODipine (NORVASC) 2.5 MG tablet Take 1 tablet (2.5 mg total) by mouth daily. 90 tablet 1  . aspirin EC 81 MG tablet Take 81 mg by mouth at bedtime.     [name] atorvastatin (LIPITOR) 10 MG tablet Take 1 tablet (10 mg total) by mouth once a week. (Patient taking differently: Take 10 mg by mouth every Tuesday. ) 90 tablet 1  . Evolocumab (REPATHA SURECLICK) 062 MG/ML SOAJ Inject 140 mg into the skin every 14 (fourteen) days. 2 pen 12  . fluticasone furoate-vilanterol (BREO ELLIPTA) 100-25 MCG/INH AEPB Inhale 1 puff into the lungs daily. 60 each 2  . LANTUS SOLOSTAR 100 UNIT/ML Solostar Pen Inject 26 Units into the skin daily. (Patient taking differently: Inject 28 Units into the skin at bedtime. ) 15 mL 3  . losartan (COZAAR) 25 MG tablet TAKE 1 TABLET(25 MG) BY MOUTH DAILY (Patient taking differently: Take 25 mg by mouth in the morning. ) 90 tablet 1  . meclizine (ANTIVERT) 12.5 MG tablet Take 1 tablet (12.5 mg total) by mouth 3 (three) times daily as needed for dizziness. 30 tablet 0  . metFORMIN (GLUCOPHAGE) 1000 MG tablet Take 1 tablet (1,000 mg total) by mouth 2 (two) times daily with a meal. 180 tablet 1  . metoprolol tartrate (LOPRESSOR) 25 MG  tablet TAKE 1 TABLET(25 MG) BY MOUTH TWICE DAILY (Patient taking differently: Take 25 mg by mouth 2 (two) times daily. ) 180 tablet 3  . montelukast (SINGULAIR) 10 MG tablet Take 1 tablet (10 mg total) by mouth at bedtime. 30 tablet 11  . MULTIPLE VITAMIN PO Take 1 tablet by mouth daily.    . nitroGLYCERIN (NITROSTAT) 0.4 MG SL tablet Place 1 tablet (0.4 mg total) under the tongue every 5 (five) minutes as needed for chest pain. 5 tablet 0  . pantoprazole (PROTONIX) 40 MG tablet TAKE 1 TABLET(40 MG) BY MOUTH DAILY (Patient taking differently: Take 40 mg by mouth in the morning. TAKE 1 TABLET(40 MG) BY MOUTH DAILY) 90 tablet 1  . sitaGLIPtin (JANUVIA) 100 MG tablet Take 1 tablet (100 mg total) by mouth daily. (Patient taking differently: Take 100 mg by mouth in the morning. ) 90 tablet 1  . tadalafil (CIALIS) 20 MG tablet Take 0.5-1 tablets (10-20 mg total) by mouth every other day  as needed for erectile dysfunction. 5 tablet 1   No current facility-administered medications for this visit.    Allergies:   Plavix [clopidogrel bisulfate], Oxycontin [oxycodone hcl], Penicillins, Pioglitazone, Prednisone, and Simvastatin    Social History:  The patient  reports that he quit smoking about 26 years ago. His smoking use included cigarettes. He started smoking about 54 years ago. He has a 15.00 pack-year smoking history. He has never used smokeless tobacco. He reports that he does not drink alcohol and does not use drugs.   Family History:  The patient's family history includes Colon cancer (age of onset: 66) in his sister; Ovarian cancer in his mother.    ROS:  Please see the history of present illness.   Otherwise, review of systems are positive for none.   All other systems are reviewed and negative.    PHYSICAL EXAM: VS:  BP (!) 142/56 (BP Location: Right Arm)   Pulse 64   Ht 5\' 7"  (1.702 m)   Wt 179 lb 12.8 oz (81.6 kg)   SpO2 97%   BMI 28.16 kg/m  , BMI Body mass index is 28.16 kg/m. GEN:  Well nourished, well developed, in no acute distress  HEENT: normal  Neck: no JVD, carotid bruits, or masses Cardiac: RRR; no  rubs, or gallops,no edema . 2/6 crescendo decrescendo systolic murmur in the aortic area which is mid peaking Respiratory:  clear to auscultation bilaterally, normal work of breathing GI: soft, nontender, nondistended, + BS MS: no deformity or atrophy  Skin: warm and dry, no rash Neuro:  Strength and sensation are intact Psych: euthymic mood, full affect Vascular: Femoral pulse: Normal bilaterally.  Distal pulses are not palpable  EKG:  EKG ordered today. EKG showed normal sinus rhythm with incomplete right bundle branch block and frequent PACs.  Recent Labs: 03/14/2019: ALT 23 08/03/2019: BUN 25; Creatinine, Ser 0.84; Potassium 3.8; Sodium 138 08/07/2019: Hemoglobin 13.6; Platelets 247.0; TSH 3.17    Lipid Panel    Component Value Date/Time   CHOL 71 08/07/2019 1108   CHOL 75 (L) 02/06/2019 1052   TRIG 70.0 08/07/2019 1108   TRIG 96 03/12/2008 0000   HDL 35.30 (L) 08/07/2019 1108   HDL 37 (L) 02/06/2019 1052   CHOLHDL 2 08/07/2019 1108   VLDL 14.0 08/07/2019 1108   LDLCALC 22 08/07/2019 1108   LDLCALC 24 02/06/2019 1052   LDLCALC 91 03/12/2008 0000      Wt Readings from Last 3 Encounters:  08/14/19 179 lb 12.8 oz (81.6 kg)  08/07/19 179 lb 12.8 oz (81.6 kg)  08/03/19 174 lb (78.9 kg)       No flowsheet data found.    ASSESSMENT AND PLAN:  1.  Peripheral arterial disease: No significant claudication.  Most recent vascular studies showed patent iliac stents and stable ABI.  Repeat studies in 1 year.    2. Coronary artery disease involving native coronary arteries without angina: Continue aggressive medical therapy.    3. Hyperlipidemia: He is currently on atorvastatin and Repatha with most recent LDL of 24.  4. Essential hypertension: Blood pressure is well controlled on current medications.  He is mildly orthostatic today and this could  be contributing to some of his symptoms.  I elected to decrease amlodipine to 2.5 mg daily.  5.  Mild aortic stenosis: Given recent worsening of dizziness, I am going to repeat his echocardiogram to ensure no worsening of face.  By exam, his aortic stenosis does not appear to be more  than moderate.  6.  Dizziness: He reports associated palpitations and thus I am going to obtain a 2-week outpatient monitor.  We will get an echocardiogram as outlined above.  Decrease amlodipine to 2.5 mg daily.  7.  Moderate bilateral carotid disease: This was recently found on CT.  We will plan on carotid Doppler in June 2022.    Disposition:   FU with me in 2 months  Signed,  [name], MD  08/15/2019 10:37 AM    [place]

## 2019-08-16 ENCOUNTER — Other Ambulatory Visit: Payer: Self-pay

## 2019-08-16 ENCOUNTER — Ambulatory Visit (HOSPITAL_COMMUNITY): Payer: Medicare PPO | Attending: Cardiology

## 2019-08-16 DIAGNOSIS — I350 Nonrheumatic aortic (valve) stenosis: Secondary | ICD-10-CM | POA: Insufficient documentation

## 2019-08-20 ENCOUNTER — Telehealth: Payer: Self-pay

## 2019-08-20 ENCOUNTER — Telehealth: Payer: Self-pay | Admitting: Cardiovascular Disease

## 2019-08-20 ENCOUNTER — Other Ambulatory Visit (INDEPENDENT_AMBULATORY_CARE_PROVIDER_SITE_OTHER): Payer: Medicare PPO

## 2019-08-20 DIAGNOSIS — R42 Dizziness and giddiness: Secondary | ICD-10-CM | POA: Diagnosis not present

## 2019-08-20 DIAGNOSIS — R002 Palpitations: Secondary | ICD-10-CM

## 2019-08-20 NOTE — Telephone Encounter (Signed)
Patient made aware of results and verbalized understanding.  Inform patient that echo was fine. Normal ejection fraction with stable moderate aortic stenosis.

## 2019-08-20 NOTE — Telephone Encounter (Signed)
° ° °  Pt is returning call from [facility] about echo result

## 2019-08-20 NOTE — Telephone Encounter (Signed)
Patient came into the office to drop off his glucose readings with front desk staff. Information converted into message. June 21 morning-170, bedtime-130 June 22 morning-145, bedtime- 180 June 23 morning- 182, bedtime- 170 June 24 morning-120, bedtime- 216 June 25 morning-165, bedtime- 180 June 26 morning-159, bedtime- 160 June 27 morning-160, bedtime- 190 June 28 morning- 215

## 2019-08-21 NOTE — Telephone Encounter (Signed)
Called patient in reference to PCP recommendations. Patient voiced understanding and will call us back in about 10 days with readings.

## 2019-08-21 NOTE — Telephone Encounter (Signed)
Increase Lantus by 2 more units.  Check fasting glucose daily.  Watch glucose levels for improvement. Goal fasting now is < 130 consistently. If glucose remaining > 130, after monitoring for a few days, increase Lantus by 2 more units.  Call in 10 days or so with readings.

## 2019-08-24 NOTE — Addendum Note (Signed)
Addended by: Wonda Horner on: 08/24/2019 11:10 AM   Modules accepted: Orders

## 2019-08-28 ENCOUNTER — Other Ambulatory Visit: Payer: Self-pay

## 2019-08-28 ENCOUNTER — Encounter: Payer: Self-pay | Admitting: Pulmonary Disease

## 2019-08-28 ENCOUNTER — Ambulatory Visit: Payer: Medicare PPO | Admitting: Pulmonary Disease

## 2019-08-28 VITALS — BP 126/70 | HR 76 | Temp 98.2°F | Ht 67.0 in | Wt 182.8 lb

## 2019-08-28 DIAGNOSIS — R05 Cough: Secondary | ICD-10-CM

## 2019-08-28 DIAGNOSIS — G4733 Obstructive sleep apnea (adult) (pediatric): Secondary | ICD-10-CM | POA: Diagnosis not present

## 2019-08-28 DIAGNOSIS — R0602 Shortness of breath: Secondary | ICD-10-CM | POA: Diagnosis not present

## 2019-08-28 DIAGNOSIS — R059 Cough, unspecified: Secondary | ICD-10-CM

## 2019-08-28 MED ORDER — FLUTICASONE FUROATE-VILANTEROL 100-25 MCG/INH IN AEPB: 1.0000 | INHALATION_SPRAY | Freq: Every day | RESPIRATORY_TRACT | 2 refills | Status: DC

## 2019-08-28 MED ORDER — TEMAZEPAM 15 MG PO CAPS: 15.0000 mg | ORAL_CAPSULE | Freq: Every evening | ORAL | 1 refills | Status: DC | PRN

## 2019-08-28 NOTE — Patient Instructions (Signed)
shortness of breath Insomnia  Trial with Restoril for insomnia Refill of Breo  Continue staying very active  We will see you back in 3 months

## 2019-08-28 NOTE — Progress Notes (Signed)
Marcus Norton    [id number]    [birth year]  Primary Care Physician:Martin, Luanna Cole, PA-C  Referring Physician: [name], PA-C [address] [place],  [place] [address]  Chief complaint:   Patient with a history of cough, History of nasal stuffiness and congestion Feels better  HPI:  His breathing has been relatively stable Continues Breo  Denies shortness of breath or chest pain Not limited with usual activities  He has a history of obstructive sleep apnea-not able to tolerate CPAP therapy -Machine already picked up by DME company as he was unable to tolerate it despite multiple attempts  History of insomnia -Has used multiple agents in the past that did not really help -Lunesta did not help recently  He quit smoking over 25 years ago Cough is intermittent, no aggravating or relieving factors Associated wheezing Some shortness of breath with activity  Sleep study did reveal moderate obstructive sleep apnea, patient is reluctant to start any treatment at present we discussed options of treatment today He gives a history suggesting presence of significant restless legs, history corroborated by spouse A lot of leg restlessness at night especially when he is trying to calm down, sometimes gets out of bed to get his legs to settle down, occasionally will soak his legs and water to get rid of a sensation 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  He does have a chronic runny nose-inhaled steroids have not helped in the past Has tried multiple multiple other interventions without relief  Wakes up multiple times during the night to use the bathroom, about 3-4 times a night Usually goes to bed between 1030 and 11 PM Able to fall asleep easily Will usually wake up in about an hour and 1/2 to 2 hours Usually not able to sleep for more than 2 hours straight tries to get up in the morning between 7 and 8 AM Admits to a dry mouth in the mornings No morning  headaches No night sweats Some memory loss, fair ability to concentrate  History of prostate issues for which he had a TURP in the past  He has used sleep aids in the past including diazepam at a low dose  Occupation: Was a Community education officer for many years, farmed for many years Exposures: No significant exposures Smoking history: Reformed smoker  Outpatient Encounter Medications as of 08/28/2019  Medication Sig   alfuzosin (UROXATRAL) 10 MG 24 hr tablet Take 10 mg by mouth daily with breakfast.   amLODipine (NORVASC) 2.5 MG tablet Take 1 tablet (2.5 mg total) by mouth daily.   aspirin EC 81 MG tablet Take 81 mg by mouth at bedtime.    atorvastatin (LIPITOR) 10 MG tablet Take 1 tablet (10 mg total) by mouth once a week. (Patient taking differently: Take 10 mg by mouth every Tuesday. )   Evolocumab (REPATHA SURECLICK) 578 MG/ML SOAJ Inject 140 mg into the skin every 14 (fourteen) days.   fluticasone furoate-vilanterol (BREO ELLIPTA) 100-25 MCG/INH AEPB Inhale 1 puff into the lungs daily.   LANTUS SOLOSTAR 100 UNIT/ML Solostar Pen Inject 26 Units into the skin daily. (Patient taking differently: Inject 28 Units into the skin at bedtime. )   losartan (COZAAR) 25 MG tablet TAKE 1 TABLET(25 MG) BY MOUTH DAILY (Patient taking differently: Take 25 mg by mouth in the morning. )   metFORMIN (GLUCOPHAGE) 1000 MG tablet Take 1 tablet (1,000 mg total) by mouth 2 (two) times daily with  a meal.   metoprolol tartrate (LOPRESSOR) 25 MG tablet TAKE 1 TABLET(25 MG) BY MOUTH TWICE DAILY (Patient taking differently: Take 25 mg by mouth 2 (two) times daily. )   montelukast (SINGULAIR) 10 MG tablet Take 1 tablet (10 mg total) by mouth at bedtime.   MULTIPLE VITAMIN PO Take 1 tablet by mouth daily.   nitroGLYCERIN (NITROSTAT) 0.4 MG SL tablet Place 1 tablet (0.4 mg total) under the tongue every 5 (five) minutes as needed for chest pain.   pantoprazole (PROTONIX) 40 MG tablet TAKE 1 TABLET(40 MG) BY MOUTH  DAILY (Patient taking differently: Take 40 mg by mouth in the morning. TAKE 1 TABLET(40 MG) BY MOUTH DAILY)   sitaGLIPtin (JANUVIA) 100 MG tablet Take 1 tablet (100 mg total) by mouth daily. (Patient taking differently: Take 100 mg by mouth in the morning. )   tadalafil (CIALIS) 20 MG tablet Take 0.5-1 tablets (10-20 mg total) by mouth every other day as needed for erectile dysfunction.   [DISCONTINUED] meclizine (ANTIVERT) 12.5 MG tablet Take 1 tablet (12.5 mg total) by mouth 3 (three) times daily as needed for dizziness.   No facility-administered encounter medications on file as of 08/28/2019.    Allergies as of 08/28/2019 - Review Complete 08/28/2019  Allergen Reaction Noted   Plavix [clopidogrel bisulfate] Shortness Of Breath 02/24/2016   Oxycontin [oxycodone hcl] Other (See Comments) 05/14/2012   Penicillins Other (See Comments) 04/30/2013   Pioglitazone Other (See Comments) 04/30/2013   Prednisone Other (See Comments) 07/14/2011   Simvastatin Other (See Comments) 05/14/2012    Past Medical History:  Diagnosis Date   Adenomatous colon polyp 2000/2010   Arthritis    "lower back" (07/29/2016)   ASCVD (arteriosclerotic cardiovascular disease)    -luminal irregularities in 1998 and 2005; normal EF   Benign prostatic hypertrophy    status post transurethral resection of the prostate   CAD (coronary artery disease)    a. Cath 07/29/16 99% ostial RCA & 70% pRCA s/p cutting balloon angioplasty and single SYNERGY DES 2.5X28. 100% very Distal 1st RPLB lesion --> the occlusion site moved further distal with guidewire advancement;  40% ostial LAD; 50% Ost Cx to Prox Cx lesion.   Coronary artery disease    Dysphagia    Erectile dysfunction    GERD (gastroesophageal reflux disease)    Heart murmur    History of hiatal hernia    History of kidney stones    Hyperlipidemia    Hypertension    Seasonal allergies    Tobacco abuse    20 pack years; discontinued 1995    Type II diabetes mellitus ([place])     Past Surgical History:  Procedure Laterality Date   APPENDECTOMY     CATARACT EXTRACTION W/ INTRAOCULAR LENS  IMPLANT, BILATERAL Bilateral    COLONOSCOPY  07/20/2011   Dr. Gala Romney: multiple hyperplastic polyps. Surveillance 2018   COLONOSCOPY N/A 06/30/2016   Procedure: COLONOSCOPY;  Surgeon: Daneil Dolin, MD;  [facility];  Service: Endoscopy;  Laterality: N/A;  10:00am   COLONOSCOPY W/ BIOPSIES AND POLYPECTOMY  07/08/08, 06/2011   Dr Madolyn Frieze papilla, diminutive rectal polyp ablated the, left-sided diverticula, piecemeal polypectomy of hepatic flexure adenomatous polyp, diminutive polyps near hepatic flexure ablated   CORONARY ANGIOPLASTY WITH STENT PLACEMENT  07/29/2016   CORONARY STENT INTERVENTION N/A 07/29/2016   Procedure: Coronary Stent Intervention;  Surgeon: Leonie Man, MD;  Location: [facility];  Service: Cardiovascular;  Laterality: N/A;  RCA   ESOPHAGOGASTRODUODENOSCOPY  2013   erosive reflux esophagitis, s/p empiric dilation. chronic duodenitis.    LEFT HEART CATH AND CORONARY ANGIOGRAPHY N/A 07/29/2016   Procedure: Left Heart Cath and Coronary Angiography;  Surgeon: Leonie Man, MD;  Location: [facility];  Service: Cardiovascular;  Laterality: N/A;   NASAL SEPTUM SURGERY     NASAL SINUS SURGERY     "cleaned out my sinus"   PERIPHERAL VASCULAR CATHETERIZATION N/A 11/26/2015   Procedure: Abdominal Aortogram w/Lower Extremity;  Surgeon: Wellington Hampshire, MD;  Location: [facility];  Service: Cardiovascular;  Laterality: N/A;   TONSILLECTOMY     TRANSURETHRAL RESECTION OF PROSTATE  2009    Family History  Problem Relation Age of Onset   Ovarian cancer Mother    Colon cancer Sister 71       deceased from colon cancer    Social History   Socioeconomic History   Marital status: Married    Spouse name: Not on file   Number of children: 2   Years of education: Not on file    Highest education level: Not on file  Occupational History   Occupation: Dietitian, 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    Comment: Gaffer of agriculture  Tobacco Use   Smoking status: Former Smoker    Packs/day: 1.00    Years: 15.00    Pack years: 15.00    Types: Cigarettes    Start date: 03/08/1965    Quit date: 07/07/1993    Years since quitting: 26.1   Smokeless tobacco: Never Used  Vaping Use   Vaping Use: Never used  Substance and Sexual Activity   Alcohol use: No    Alcohol/week: 0.0 standard drinks    Comment: 07/29/2016 "last drink was 4 wks ago; had a beer"   Drug use: No   Sexual activity: Not Currently  Other Topics Concern   Not on file  Social History Narrative   5 grandchildren    Social Determinants of Health   Financial Resource Strain:    Difficulty of Paying Living Expenses:   Food Insecurity:    Worried About Charity fundraiser in the Last Year:    Arboriculturist in the Last Year:   Transportation Needs:    Film/video editor (Medical):    Lack of Transportation (Non-Medical):   Physical Activity:    Days of Exercise per Week:    Minutes of Exercise per Session:   Stress:    Feeling of Stress :   Social Connections:    Frequency of Communication with Friends and Family:    Frequency of Social Gatherings with Friends and Family:    Attends Religious Services:    Active Member of Clubs or Organizations:    Attends Music therapist:    Marital Status:   Intimate Partner Violence:    Fear of Current or Ex-Partner:    Emotionally Abused:    Physically Abused:    Sexually Abused:     Review of Systems  Constitutional: Negative.  Negative for unexpected weight change.  HENT: Positive for postnasal drip.        Sinus problems, nasal stuffiness, runny nose  Eyes: Negative.   Respiratory: Positive for apnea. Negative for cough and shortness of breath.        Significant history of snoring  Cardiovascular:  Negative.   Gastrointestinal: Negative.   Psychiatric/Behavioral: Positive for sleep disturbance.    Vitals:   08/28/19 1116  BP: 126/70  Pulse:  76  Temp: 98.2 F (36.8 C)  SpO2: 95%   Physical Exam Constitutional:      General: He is not in acute distress.    Appearance: He is well-developed. He is not diaphoretic.  HENT:     Head: Normocephalic and atraumatic.  Eyes:     General:        Right eye: No discharge.        Left eye: No discharge.     Conjunctiva/sclera: Conjunctivae normal.     Pupils: Pupils are equal, round, and reactive to light.  Neck:     Thyroid: No thyromegaly.     Trachea: No tracheal deviation.  Cardiovascular:     Rate and Rhythm: Normal rate and regular rhythm.  Pulmonary:     Effort: Pulmonary effort is normal. No respiratory distress.     Breath sounds: No stridor. No wheezing or rales.  Musculoskeletal:     Cervical back: No rigidity or tenderness.  Neurological:     General: No focal deficit present.  Psychiatric:        Mood and Affect: Mood normal.    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 -States this is affecting his quality of life and would like to try a sleep aid -Lunesta did not help -We will try Restoril  Multiple awakenings at night/nocturia -The awakenings may be on account of sleep disordered breathing -Associated nocturia because he is already awake versus nocturia been more common in patients with untreated sleep disordered breathing  Chronic rhinitis, this may be contributing to his chronic cough -Atrovent nasal, Singulair, Flonase  Plan/Recommendations:  Continue use of Requip  Restoril 15 mg nightly  Singulair and Flonase  Continue  Breo-refills provided  Call with significant concerns, follow-up in 3 months  Sherrilyn Rist MD  Pulmonary and Critical Care 08/28/2019, 11:38 AM  CC: [name], PA-C

## 2019-10-01 ENCOUNTER — Ambulatory Visit: Payer: Medicare PPO | Admitting: Cardiovascular Disease

## 2019-10-01 ENCOUNTER — Encounter: Payer: Self-pay | Admitting: Cardiovascular Disease

## 2019-10-01 ENCOUNTER — Other Ambulatory Visit: Payer: Self-pay

## 2019-10-01 ENCOUNTER — Telehealth: Payer: Self-pay | Admitting: Cardiovascular Disease

## 2019-10-01 VITALS — BP 129/55 | HR 60 | Ht 67.0 in | Wt 180.2 lb

## 2019-10-01 DIAGNOSIS — I495 Sick sinus syndrome: Secondary | ICD-10-CM

## 2019-10-01 DIAGNOSIS — I25118 Atherosclerotic heart disease of native coronary artery with other forms of angina pectoris: Secondary | ICD-10-CM

## 2019-10-01 DIAGNOSIS — I471 Supraventricular tachycardia: Secondary | ICD-10-CM | POA: Diagnosis not present

## 2019-10-01 DIAGNOSIS — I6523 Occlusion and stenosis of bilateral carotid arteries: Secondary | ICD-10-CM

## 2019-10-01 DIAGNOSIS — Z01818 Encounter for other preprocedural examination: Secondary | ICD-10-CM | POA: Diagnosis not present

## 2019-10-01 DIAGNOSIS — I4719 Other supraventricular tachycardia: Secondary | ICD-10-CM

## 2019-10-01 DIAGNOSIS — E782 Mixed hyperlipidemia: Secondary | ICD-10-CM

## 2019-10-01 DIAGNOSIS — I350 Nonrheumatic aortic (valve) stenosis: Secondary | ICD-10-CM | POA: Diagnosis not present

## 2019-10-01 DIAGNOSIS — I739 Peripheral vascular disease, unspecified: Secondary | ICD-10-CM

## 2019-10-01 DIAGNOSIS — E1151 Type 2 diabetes mellitus with diabetic peripheral angiopathy without gangrene: Secondary | ICD-10-CM | POA: Insufficient documentation

## 2019-10-01 DIAGNOSIS — IMO0002 Reserved for concepts with insufficient information to code with codable children: Secondary | ICD-10-CM

## 2019-10-01 DIAGNOSIS — I10 Essential (primary) hypertension: Secondary | ICD-10-CM

## 2019-10-01 DIAGNOSIS — E1165 Type 2 diabetes mellitus with hyperglycemia: Secondary | ICD-10-CM

## 2019-10-01 NOTE — H&P (View-Only) (Signed)
Cardiology Office Note:    Date:  10/01/2019   ID:  Marcus Norton, DOB 24-Dec-1940, MRN [id number]  PCP:  [name], PA-C  [facility] Cardiologist:  No primary care provider on file.  [facility] Electrophysiologist:  None   Referring MD: [name], PA-C   Chief Complaint  Patient presents with  . Irregular Heart Beat    History of Present Illness:    Marcus Norton is a 79 y.o. male with a hx of PAD (bilateral iliac kissing stent placement October 2017, occlusion of right SFA with popliteal reconstitution, diffuse moderate left SFA stenosis), SVT, CAD (single-vessel disease ostial RCA stent 2018, occluded second posterolateral ventricular branch), mild-moderate aortic stenosis, moderate bilateral carotid artery disease by CT, HTN, DM type 2 on insulin,  hypercholesterolemia (on statin + PCSK9 inhibitor).  He is referred for abnormalities on his event monitor.  This was ordered due to complaints of random episodes of dizziness and lightheadedness and presyncope, inconsistently associated with his palpitations.  The event monitor showed a 3-second pause (symptomatic) and also showed numerous episodes of ectopic atrial tachycardia (14 episodes lasting up to 9 seconds in duration and frequent PACs 10% of all beats).  There were no significant ventricular arrhythmias noted.  The echocardiogram performed August 16, 2019 showed normal LVEF 65-70%, mild LVH, diastolic dysfunction with elevated filling pressures, degenerative aortic valve changes with mild-moderate stenosis (mean gradient 18 mmHg, dimensionless index 0.32, calculated aortic valve area 1.12 cm.  He notes reduced exercise tolerance, easily becomes tired with physical activity.  Does not have orthopnea, PND or leg edema.  Palpitations are always more noticeable if he lies on his left side.  Angina has not been a problem recently.  He also notices some lightheadedness and reports that his diastolic blood pressure  is rather low (in the 50s).  He wonders whether he should cut back his amlodipine further.  Past Medical History:  Diagnosis Date  . 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 Arthritis    "lower back" (07/29/2016)  . ASCVD (arteriosclerotic cardiovascular disease)    -luminal irregularities in 1998 and 2005; normal EF  . Benign prostatic hypertrophy    status post transurethral resection of the prostate  . CAD (coronary artery disease)    a. Cath 07/29/16 99% ostial RCA & 70% pRCA s/p cutting balloon angioplasty and single SYNERGY DES 2.5X28. 100% very Distal 1st RPLB lesion --> the occlusion site moved further distal with guidewire advancement;  40% ostial LAD; 50% Ost Cx to Prox Cx lesion.  . Coronary artery disease   . Dysphagia   . Erectile dysfunction   . GERD (gastroesophageal reflux disease)   . Heart murmur   . History of hiatal hernia   . History of kidney stones   . Hyperlipidemia   . Hypertension   . Seasonal allergies   . Tobacco abuse    20 pack years; discontinued 1995  . Type II diabetes mellitus ([place])     Past Surgical History:  Procedure Laterality Date  . APPENDECTOMY    . CATARACT EXTRACTION W/ INTRAOCULAR LENS  IMPLANT, BILATERAL Bilateral   . COLONOSCOPY  07/20/2011   Dr. Gala Romney: multiple hyperplastic polyps. Surveillance 2018  . COLONOSCOPY N/A 06/30/2016   Procedure: COLONOSCOPY;  Surgeon: Daneil Dolin, MD;  [facility];  Service: Endoscopy;  Laterality: N/A;  10:00am  . COLONOSCOPY W/ BIOPSIES AND POLYPECTOMY  07/08/08, 06/2011   Dr Madolyn Frieze papilla, diminutive rectal polyp ablated the, left-sided  diverticula, piecemeal polypectomy of hepatic flexure adenomatous polyp, diminutive polyps near hepatic flexure ablated  . CORONARY ANGIOPLASTY WITH STENT PLACEMENT  07/29/2016  . CORONARY STENT INTERVENTION N/A 07/29/2016   Procedure: Coronary Stent Intervention;  Surgeon: Leonie Man, MD;  Location: [place] [facility];  Service: Cardiovascular;   Laterality: N/A;  RCA  . ESOPHAGOGASTRODUODENOSCOPY  2013   erosive reflux esophagitis, s/p empiric dilation. chronic duodenitis.   [name] LEFT HEART CATH AND CORONARY ANGIOGRAPHY N/A 07/29/2016   Procedure: Left Heart Cath and Coronary Angiography;  Surgeon: Leonie Man, MD;  Location: [place] [facility];  Service: Cardiovascular;  Laterality: N/A;  . NASAL SEPTUM SURGERY    . NASAL SINUS SURGERY     "cleaned out my sinus"  . PERIPHERAL VASCULAR CATHETERIZATION N/A 11/26/2015   Procedure: Abdominal Aortogram w/Lower Extremity;  Surgeon: Wellington Hampshire, MD;  Location: [place] [facility];  Service: Cardiovascular;  Laterality: N/A;  . TONSILLECTOMY    . TRANSURETHRAL RESECTION OF PROSTATE  2009    Current Medications: Current Meds  Medication Sig  . alfuzosin (UROXATRAL) 10 MG 24 hr tablet Take 10 mg by mouth daily with breakfast.  . amLODipine (NORVASC) 2.5 MG tablet Take 1 tablet (2.5 mg total) by mouth daily.  [name] aspirin EC 81 MG tablet Take 81 mg by mouth at bedtime.   [name] atorvastatin (LIPITOR) 10 MG tablet Take 1 tablet (10 mg total) by mouth once a week. (Patient taking differently: Take 10 mg by mouth every Tuesday. )  . Evolocumab (REPATHA SURECLICK) 865 MG/ML SOAJ Inject 140 mg into the skin every 14 (fourteen) days.  . fluticasone furoate-vilanterol (BREO ELLIPTA) 100-25 MCG/INH AEPB Inhale 1 puff into the lungs daily.  [name] LANTUS SOLOSTAR 100 UNIT/ML Solostar Pen Inject 26 Units into the skin daily. (Patient taking differently: Inject 28 Units into the skin at bedtime. )  . losartan (COZAAR) 25 MG tablet TAKE 1 TABLET(25 MG) BY MOUTH DAILY (Patient taking differently: Take 25 mg by mouth in the morning. )  . metFORMIN (GLUCOPHAGE) 1000 MG tablet Take 1 tablet (1,000 mg total) by mouth 2 (two) times daily with a meal.  . metoprolol tartrate (LOPRESSOR) 25 MG tablet TAKE 1 TABLET(25 MG) BY MOUTH TWICE DAILY (Patient taking differently: Take 25 mg by mouth 2 (two) times daily. )  .  montelukast (SINGULAIR) 10 MG tablet Take 1 tablet (10 mg total) by mouth at bedtime.  . MULTIPLE VITAMIN PO Take 1 tablet by mouth daily.  . nitroGLYCERIN (NITROSTAT) 0.4 MG SL tablet Place 1 tablet (0.4 mg total) under the tongue every 5 (five) minutes as needed for chest pain.  . pantoprazole (PROTONIX) 40 MG tablet TAKE 1 TABLET(40 MG) BY MOUTH DAILY (Patient taking differently: Take 40 mg by mouth in the morning. TAKE 1 TABLET(40 MG) BY MOUTH DAILY)  . sitaGLIPtin (JANUVIA) 100 MG tablet Take 1 tablet (100 mg total) by mouth daily. (Patient taking differently: Take 100 mg by mouth in the morning. )  . tadalafil (CIALIS) 20 MG tablet Take 0.5-1 tablets (10-20 mg total) by mouth every other day as needed for erectile dysfunction.  . temazepam (RESTORIL) 15 MG capsule Take 1 capsule (15 mg total) by mouth at bedtime as needed for sleep.     Allergies:   Plavix [clopidogrel bisulfate], Oxycontin [oxycodone hcl], Penicillins, Pioglitazone, Prednisone, and Simvastatin   Social History   Socioeconomic History  . Marital status: Married    Spouse name: Not on file  .  Number of children: 2  . Years of education: Not on file  . Highest education level: Not on file  Occupational History  . Occupation: Dietitian, 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    Comment: [employer]  Tobacco Use  . Smoking status: Former Smoker    Packs/day: 1.00    Years: 15.00    Pack years: 15.00    Types: Cigarettes    Start date: 03/08/1965    Quit date: 07/07/1993    Years since quitting: 26.2  . Smokeless tobacco: Never Used  Vaping Use  . Vaping Use: Never used  Substance and Sexual Activity  . Alcohol use: No    Alcohol/week: 0.0 standard drinks    Comment: 07/29/2016 "last drink was 4 wks ago; had a beer"  . Drug use: No  . Sexual activity: Not Currently  Other Topics Concern  . Not on file  Social History Narrative   5 grandchildren    Social Determinants of Health   Financial Resource  Strain:   . Difficulty of Paying Living Expenses:   Food Insecurity:   . Worried About Charity fundraiser in the Last Year:   . Arboriculturist in the Last Year:   Transportation Needs:   . Film/video editor (Medical):   [name] Lack of Transportation (Non-Medical):   Physical Activity:   . Days of Exercise per Week:   . Minutes of Exercise per Session:   Stress:   . Feeling of Stress :   Social Connections:   . Frequency of Communication with Friends and Family:   . Frequency of Social Gatherings with Friends and Family:   . Attends Religious Services:   . Active Member of Clubs or Organizations:   . Attends Archivist Meetings:   [name] Marital Status:      Family History: The patient's family history includes Colon cancer (age of onset: 79) in his sister; Ovarian cancer in his mother.  ROS:   Please see the history of present illness.    All other systems reviewed and are negative.  EKGs/Labs/Other Studies Reviewed:    The following studies were reviewed today: Echo 08/16/2019 1. Left ventricular ejection fraction, by estimation, is 65 to 70%. The  left ventricle has normal function. The left ventricle has no regional  wall motion abnormalities. There is mild concentric left ventricular  hypertrophy. Left ventricular diastolic  parameters are indeterminate. Elevated left ventricular end-diastolic  pressure. The average left ventricular global longitudinal strain is -21.8  %. The global longitudinal strain is normal.  2. Right ventricular systolic function is normal. The right ventricular  size is normal. Tricuspid regurgitation signal is inadequate for assessing  PA pressure.  3. The mitral valve is normal in structure. Mild mitral valve  regurgitation. No evidence of mitral stenosis.  4. The aortic valve has an indeterminant number of cusps. Aortic valve  regurgitation is trivial. Mild to moderate aortic valve stenosis. Aortic  valve area, by VTI measures  1.12 cm. Aortic valve mean gradient measures  18.0 mmHg. Aortic valve Vmax  measures 2.68 m/s. DI 0.32.  5. Aortic dilatation noted. 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  6. The inferior vena cava is normal in size with greater than 50%  respiratory variability, suggesting right atrial pressure of 3 mmHg.  Event monitor 2 weeks 09/20/2019 Normal sinus rhythm with an average heart rate of 75 bpm. 14 runs of SVT, the longest lasted 9.4 seconds  with a rate of 134 bpm. 3-second pause which happened at 11 in the morning.  This was a triggered event but symptoms were not specified. Frequent PACs with a burden of 10.1%.  Some of the triggered events correlated with PACs. Rare PVCs with a burden of less than 1%.   EKG:  EKG is ordered today.  The ekg ordered today demonstrates sinus rhythm with a single PAC, rate around 60 bpm, incomplete right bundle branch block and left anterior fascicular bloc position abnormalities, QTC 418 ms k, QS pattern in leads V1 and V2, no ischemic Recent Labs: 03/14/2019: ALT 23 08/03/2019: BUN 25; Creatinine, Ser 0.84; Potassium 3.8; Sodium 138 08/07/2019: Hemoglobin 13.6; Platelets 247.0; TSH 3.17  Recent Lipid Panel    Component Value Date/Time   CHOL 71 08/07/2019 1108   CHOL 75 (L) 02/06/2019 1052   TRIG 70.0 08/07/2019 1108   TRIG 96 03/12/2008 0000   HDL 35.30 (L) 08/07/2019 1108   HDL 37 (L) 02/06/2019 1052   CHOLHDL 2 08/07/2019 1108   VLDL 14.0 08/07/2019 1108   LDLCALC 22 08/07/2019 1108   LDLCALC 24 02/06/2019 1052   LDLCALC 91 03/12/2008 0000    Physical Exam:    VS:  BP (!) 129/55   Pulse 60   Ht 5\' 7"  (1.702 m)   Wt 180 lb 3.2 oz (81.7 kg)   SpO2 97%   BMI 28.22 kg/m     Wt Readings from Last 3 Encounters:  10/01/19 180 lb 3.2 oz (81.7 kg)  08/28/19 182 lb 12.8 oz (82.9 kg)  08/14/19 179 lb 12.8 oz (81.6 kg)     GEN: Appears younger than stated age.  Well nourished, well developed in no acute  distress HEENT: Normal NECK: No JVD; No carotid bruits LYMPHATICS: No lymphadenopathy CARDIAC: Occasional ectopy on a background of RRR, 2/6 early peaking aortic ejection murmur, no diastolic murmurs, rubs, gallops.  Extremities are warm but there are no pulses palpable in his feet.  Normal radial and ulnar pulses. RESPIRATORY:  Clear to auscultation without rales, wheezing or rhonchi  ABDOMEN: Soft, non-tender, non-distended MUSCULOSKELETAL:  No edema; No deformity  SKIN: Warm and dry NEUROLOGIC:  Alert and oriented x 3 PSYCHIATRIC:  Normal affect   ASSESSMENT:    1. Tachycardia-bradycardia syndrome ([place])   2. PAT (paroxysmal atrial tachycardia) ([place])   3. Aortic valve stenosis, nonrheumatic   4. PAD (peripheral artery disease) ([place])   5. Coronary artery disease of native artery of native heart with stable angina pectoris ([place])   6. Bilateral carotid artery stenosis   7. Mixed hyperlipidemia   8. DM (diabetes mellitus) type II uncontrolled, periph vascular disorder ([place])   9. Essential hypertension   10. Pre-op testing    PLAN:    In order of problems listed above:  1. Tachycardia-bradycardia syndrome: He has evidence of both recurrent symptomatic paroxysmal atrial tachycardia and dizziness related to sinus pauses up to 3 seconds in duration.  He also has evidence of intraventricular conduction abnormalities and may at some point require ventricular pacing.  Unable to increase the dose of beta-blocker without backup pacemaker.  We discussed the pros and cons of having a permanent pacemaker, the details of implantation, possible adverse events including but not limited to lead dislodgment, need for reoperation, pneumothorax, need for chest tube, lead perforation, infection.  Reviewed long-term management and follow-up.  He asked several questions about the procedure and wishes to proceed. 2. PAT: We will be able to increase the dose  of beta-blocker after device implantation. 3. AS:  Mild to moderate and unlikely to be contributing to his dizziness. 4. PAD: Not currently limited by claudication.  He did not tolerate treatment with cilostazol which increased palpitations. 5. CAD: Does not have angina pectoris.  On 2 different antianginal medications (he has a known occluded posterolateral ventricular branch). 6. Bilateral moderate carotid artery stenosis: Scheduled by Dr. Fletcher Anon for a follow-up duplex ultrasound June 2022 7. HLP: Excellent LDL cholesterol level on PCSK9 inhibitor. 8. DM: Take half of his usual dose of insulin on the day of pacemaker implantation.  Glycemic control is suboptimal with most recent hemoglobin A1c 8.7%. 9. HTN: His diastolic blood pressure is relatively low.  Will wait and see if his dizziness resolves after pacemaker implantation.  Amlodipine may be having some benefit as an antianginal.    Medication Adjustments/Labs and Tests Ordered: Current medicines are reviewed at length with the patient today.  Concerns regarding medicines are outlined above.  Orders Placed This Encounter  Procedures  . CBC With Differential  . Basic metabolic panel  . EKG 12-Lead   No orders of the defined types were placed in this encounter.   Patient Instructions  Medications: Your physician recommends that you continue on your current medications as directed. Please refer to the Current Medication list given to you today.     * If you need a refill on your cardiac medications before your next appointment, please call your pharmacy. *  Testing/Procedures: Your physician has recommended that you have a pacemaker inserted. A pacemaker is a small device that is placed under the skin of your chest or abdomen to help control abnormal heart rhythms. This device uses electrical pulses to prompt the heart to beat at a normal rate. Pacemakers are used to treat heart rhythms that are too slow. Wire (leads) are attached to the pacemaker that goes into the chambers of you  heart. This is done in the hospital and usually requires and overnight stay. Please follow the instructions below, located under the special instructions section.  Follow-Up: Your physician recommends that you schedule a wound check appointment 10-14 days, after your procedure on 10/15/19, with the device clinic.   Your physician recommends that you schedule a follow up appointment in 91 days, after your procedure on 10/15/19, with Dr. Sallyanne Kuster.  Thank you for choosing [facility]!!      Any Other Special Instructions Will Be Listed Below (If Applicable).     Implantable Device Instructions  You are scheduled for a Permanent Transvenous Pacemaker on 10/15/19  with Dr. Sallyanne Kuster.  1.   Please arrive at the [facility], Entrance "A"  at [facility] at  11:30 am on the day of your procedure. (The address is [address])  2. Do not eat or drink after midnight the night before your procedure.  3.  Labs:   Your provider would like for you to have labs today: BMET AND CBC  You will need to have the coronavirus test completed prior to your procedure. An appointment has been made at 2:45 pm  on 10/11/19. This is a Drive Up Visit at [address], [place], [place] [address]. Please tell them that you are there for procedure testing. Stay in your car and someone will be with you shortly. Please make sure to have all other labs completed before this test because you will need to stay quarantined until your procedure.  4.  Medications: TAKE half the dose  of the Lantus the night before the procedure. Hold all diabetic medication the morning of the procedure (Metformin and Januvia)  5.  Plan for an overnight stay.  Bring your insurance cards and a list of you medications.  6.  Wash your chest and neck with surgical scrub the evening before and the morning of your procedure.  Rinse well. Please review the surgical scrub instruction sheet given to you. You may pick this up  at the [facility] office or any drug store.  7. Your chest will need to be shaved prior to this procedure (if needed). We ask that you do this yourself at home 1 to 2 days before or if uncomfortable/unable to do yourself, then it will be performed by the hospital staff the day of.                                                                                                                * If you have ant questions after you get home, please call Lattie Haw, RN at [phone]  * Every attempt is made to prevent procedures from being rescheduled.  Due to the nature of  Electrophysiology, rescheduling can happen.  The physician is always aware and directs the staff when this occurs.     Pacemaker Implantation, Adult Pacemaker implantation is a procedure to place a pacemaker inside your chest. A pacemaker is a small computer that sends electrical signals to the heart and helps your heart beat normally. A pacemaker also stores information about your heart rhythms. You may need pacemaker implantation if you:  Have a slow heartbeat (bradycardia).  Faint (syncope).  Have shortness of breath (dyspnea) due to heart problems.  The pacemaker attaches to your heart through a wire, called a lead. Sometimes just one lead is needed. Other times, there will be two leads. There are two types of pacemakers:  Transvenous pacemaker. This type is placed under the skin or muscle of your chest. The lead goes through a vein in the chest area to reach the inside of the heart.  Epicardial pacemaker. This type is placed under the skin or muscle of your chest or belly. The lead goes through your chest to the outside of the heart.  Tell a health care provider about:  Any allergies you have.  All medicines you are taking, including vitamins, herbs, eye drops, creams, and over-the-counter medicines.  Any problems you or family members have had with anesthetic medicines.  Any blood or bone disorders you have.  Any  surgeries you have had.  Any medical conditions you have.  Whether you are pregnant or may be pregnant. What are the risks? Generally, this is a safe procedure. However, problems may occur, including:  Infection.  Bleeding.  Failure of the pacemaker or the lead.  Collapse of a lung or bleeding into a lung.  Blood clot inside a blood vessel with a lead.  Damage to the heart.  Infection inside the heart (endocarditis).  Allergic reactions to medicines.  What happens before the procedure?  Staying hydrated Follow instructions from your health care provider about hydration, which may include:  Up to 2 hours before the procedure - you may continue to drink clear liquids, such as water, clear fruit juice, black coffee, and plain tea.  Eating and drinking restrictions Follow instructions from your health care provider about eating and drinking, which may include:  8 hours before the procedure - stop eating heavy meals or foods such as meat, fried foods, or fatty foods.  6 hours before the procedure - stop eating light meals or foods, such as toast or cereal.  6 hours before the procedure - stop drinking milk or drinks that contain milk.  2 hours before the procedure - stop drinking clear liquids.  Medicines  Ask your health care provider about: ? Changing or stopping your regular medicines. This is especially important if you are taking diabetes medicines or blood thinners. ? Taking medicines such as aspirin and ibuprofen. These medicines can thin your blood. Do not take these medicines before your procedure if your health care provider instructs you not to.  You may be given antibiotic medicine to help prevent infection. General instructions  You will have a heart evaluation. This may include an electrocardiogram (ECG), chest X-ray, and heart imaging (echocardiogram,  or echo) tests.  You will have blood tests.  Do not use any products that contain nicotine or tobacco,  such as cigarettes and e-cigarettes. If you need help quitting, ask your health care provider.  Plan to have someone take you home from the hospital or clinic.  If you will be going home right after the procedure, plan to have someone with you for 24 hours.  Ask your health care provider how your surgical site will be marked or identified. What happens during the procedure?  To reduce your risk of infection: ? Your health care team will wash or sanitize their hands. ? Your skin will be washed with soap. ? Hair may be removed from the surgical area.  An IV tube will be inserted into one of your veins.  You will be given one or more of the following: ? A medicine to help you relax (sedative). ? A medicine to numb the area (local anesthetic). ? A medicine to make you fall asleep (general anesthetic).  If you are getting a transvenous pacemaker: ? An incision will be made in your upper chest. ? A pocket will be made for the pacemaker. It may be placed under the skin or between layers of muscle. ? The lead will be inserted into a blood vessel that returns to the heart. ? While X-rays are taken by an imaging machine (fluoroscopy), the lead will be advanced through the vein to the inside of your heart. ? The other end of the lead will be tunneled under the skin and attached to the pacemaker.  If you are getting an epicardial pacemaker: ? An incision will be made near your ribs or breastbone (sternum) for the lead. ? The lead will be attached to the outside of your heart. ? Another incision will be made in your chest or upper belly to create a pocket for the pacemaker. ? The free end of the lead will be tunneled under the skin and attached to the pacemaker.  The transvenous or epicardial pacemaker will be tested. Imaging studies may be done to check the lead position.  The incisions will be closed with stitches (sutures), adhesive strips, or skin glue.  Bandages (dressing) will be  placed  over the incisions. The procedure may vary among health care providers and hospitals. What happens after the procedure?  Your blood pressure, heart rate, breathing rate, and blood oxygen level will be monitored until the medicines you were given have worn off.  You will be given antibiotics and pain medicine.  ECG and chest x-rays will be done.  You will wear a continuous type of ECG (Holter monitor) to check your heart rhythm.  Your health care provider will program the pacemaker.  Do not drive for 24 hours if you received a sedative. This information is not intended to replace advice given to you by your health care provider. Make sure you discuss any questions you have with your health care provider. Document Released: 01/29/2002 Document Revised: 08/29/2015 Document Reviewed: 07/23/2015 Elsevier Interactive Patient Education  2018 [employer].     Pacemaker Implantation, Adult, Care After This sheet gives you information about how to care for yourself after your procedure. Your health care provider may also give you more specific instructions. If you have problems or questions, contact your health care provider. What can I expect after the procedure? After the procedure, it is common to have:  Mild pain.  Slight bruising.  Some swelling over the incision.  A slight bump over the skin where the device was placed. Sometimes, it is possible to feel the device under the skin. This is normal.  Follow these instructions at home: Medicines  Take over-the-counter and prescription medicines only as told by your health care provider.  If you were prescribed an antibiotic medicine, take it as told by your health care provider. Do not stop taking the antibiotic even if you start to feel better. Wound care  Do not remove the bandage on your chest until directed to do so by your health care provider.  After your bandage is removed, you may see pieces of tape called skin  adhesive strips over the area where the cut was made (incision site). Let them fall off on their own.  Check the incision site every day to make sure it is not infected, bleeding, or starting to pull apart.  Do not use lotions or ointments near the incision site unless directed to do so.  Keep the incision area clean and dry for 2-3 days after the procedure or as directed by your health care provider. It takes several weeks for the incision site to completely heal.  Do not take baths, swim, or use a hot tub for 7-10 days or as otherwise directed by your health care provider. Activity  Do not drive or use heavy machinery while taking prescription pain medicine.  Do not drive for 24 hours if you were given a medicine to help you relax (sedative).  Check with your health care provider before you start to drive or play sports.  Avoid sudden jerking, pulling, or chopping movements that pull your upper arm far away from your body. Avoid these movements for at least 6 weeks or as long as told by your health care provider.  Do not lift your upper arm above your shoulders for at least 6 weeks or as long as told by your health care provider. This means no tennis, golf, or swimming.  You may go back to work when your health care provider says it is okay. Pacemaker care  You may be shown how to transfer data from your pacemaker through the phone to your health care provider.  Always let all health care providers know about  your pacemaker before you have any medical procedures or tests.  Wear a medical ID bracelet or necklace stating that you have a pacemaker. Carry a pacemaker ID card with you at all times.  Your pacemaker battery will last for 5-15 years. Routine checks by your health care provider will let the health care provider know when the battery is starting to run down. The pacemaker will need to be replaced when the battery starts to run down.  Do not use amateur Nurse, children's. Other electrical devices are safe to use, including power tools, lawn mowers, and speakers. If you are unsure of whether something is safe to use, ask your health care provider.  When using your cell phone, hold it to the ear opposite the pacemaker. Do not leave your cell phone in a pocket over the pacemaker.  Avoid places or objects that have a strong electric or magnetic field, including: ? [employer]. When at the airport, let officials know that you have a pacemaker. ? Power plants. ? Large electrical generators. ? Radiofrequency transmission towers, such as cell phone and radio towers. General instructions  Weigh yourself every day. If you suddenly gain weight, fluid may be building up in your body.  Keep all follow-up visits as told by your health care provider. This is important. Contact a health care provider if:  You gain weight suddenly.  Your legs or feet swell.  It feels like your heart is fluttering or skipping beats (heart palpitations).  You have chills or a fever.  You have more redness, swelling, or pain around your incisions.  You have more fluid or blood coming from your incisions.  Your incisions feel warm to the touch.  You have pus or a bad smell coming from your incisions. Get help right away if:  You have chest pain.  You have trouble breathing or are short of breath.  You become extremely tired.  You are light-headed or you faint. This information is not intended to replace advice given to you by your health care provider. Make sure you discuss any questions you have with your health care provider. Document Released: 08/28/2004 Document Revised: 11/21/2015 Document Reviewed: 11/21/2015 Elsevier Interactive Patient Education  2018 [place] Discharge Instructions for  Pacemaker/Defibrillator Patients  ACTIVITY No heavy lifting or vigorous activity with your left/right arm for 6 to 8  weeks.  Do not raise your left/right arm above your head for one week.  Gradually raise your affected arm as drawn below.           __  NO DRIVING for     ; you may begin driving on     .  WOUND CARE - Keep the wound area clean and dry.  Do not get this area wet for one week. No showers for one week; you may shower on     . - The tape/steri-strips on your wound will fall off; do not pull them off.  No bandage is needed on the site.  DO  NOT apply any creams, oils, or ointments to the wound area. - If you notice any drainage or discharge from the wound, any swelling or bruising at the site, or you develop a fever > 101? F after you are discharged home, call the office at once.  SPECIAL INSTRUCTIONS - You are still able to use cellular telephones; use the ear opposite the side where you have your pacemaker/defibrillator.  Avoid carrying  your cellular phone near your device. - When traveling through airports, show security personnel your identification card to avoid being screened in the metal detectors.  Ask the security personnel to use the hand wand. - Avoid arc welding equipment, MRI testing (magnetic resonance imaging), TENS units (transcutaneous nerve stimulators).  Call the office for questions about other devices. - Avoid electrical appliances that are in poor condition or are not properly grounded. - Microwave ovens are safe to be near or to operate.  ADDITIONAL INFORMATION FOR DEFIBRILLATOR PATIENTS SHOULD YOUR DEVICE GO OFF: - If your device goes off ONCE and you feel fine afterward, notify the device clinic nurses. - If your device goes off ONCE and you do not feel well afterward, call 911. - If your device goes off TWICE, call 911. - If your device goes off [place], call 911.  DO NOT DRIVE YOURSELF OR A FAMILY MEMBER WITH A DEFIBRILLATOR TO THE HOSPITAL--CALL 911.    [place] - Preparing For Surgery  Before surgery, you can play an important role. Because  skin is not sterile, your skin needs to be as free of germs as possible. You can reduce the number of germs on your skin by washing with CHG (chlorahexidine gluconate) Soap before surgery.  CHG is an antiseptic cleaner which kills germs and bonds with the skin to continue killing germs even after washing.   Please do not use if you have an allergy to CHG or antibacterial soaps.  If your skin becomes reddened/irritated stop using the CHG.   Do not shave (including legs and underarms) for at least 48 hours prior to first CHG shower.  It is OK to shave your face.  Please follow these instructions carefully:  1.  Shower the night before surgery and the morning of surgery with CHG.  2.  If you choose to wash your hair, wash your hair first as usual with your normal shampoo.  3.  After you shampoo, rinse your hair and body thoroughly to remove the shampoo.  4.  Use CHG as you would any other liquid soap.  You can apply CHG directly to the skin and wash gently with a clean washcloth. 5.  Apply the CHG Soap to your body ONLY FROM THE NECK DOWN.  Do not use on open wounds or open sores.  Avoid contact with your eyes, ears, mouth and genitals (private parts).  Wash genitals (private parts) with your normal soap.  6.  Wash thoroughly, paying special attention to the area where your surgery will be performed.  7.  Thoroughly rise your body with warm water from the neck down.   8.  DO NOT shower/wash with your normal soap after using and rinsing off the CHG soap.  9.  Pat yourself dry with a clean towel.           10.  Wear clean pajamas.           11.  Place clean sheets on your bed the night of your first shower and do not sleep with pets.  Day of Surgery: Do not apply any deodorants/lotions.  Please wear clean clothes to the hospital/surgery center.         Signed, Sanda Klein, MD  10/01/2019 2:16 PM    [place]

## 2019-10-01 NOTE — Telephone Encounter (Signed)
Left msg for patient to call and schedule 3 month f/u with Dr. Sallyanne Kuster

## 2019-10-01 NOTE — Progress Notes (Signed)
Cardiology Office Note:    Date:  10/01/2019   ID:  [name], DOB 1941-01-28, MRN [id number]  PCP:  Marcus Jeans, PA-C  [facility] Cardiologist:  No primary care provider on file.  Marcus Norton Electrophysiologist:  None   Referring MD: Marcus Jeans, PA-C   Chief Complaint  Patient presents with  . Irregular Heart Beat    History of Present Illness:    Marcus Norton is a 79 y.o. male with a hx of PAD (bilateral iliac kissing stent placement October 2017, occlusion of right SFA with popliteal reconstitution, diffuse moderate left SFA stenosis), SVT, CAD (single-vessel disease ostial RCA stent 2018, occluded second posterolateral ventricular branch), mild-moderate aortic stenosis, moderate bilateral carotid artery disease by CT, HTN, DM type 2 on insulin,  hypercholesterolemia (on statin + PCSK9 inhibitor).  He is referred for abnormalities on his event monitor.  This was ordered due to complaints of random episodes of dizziness and lightheadedness and presyncope, inconsistently associated with his palpitations.  The event monitor showed a 3-second pause (symptomatic) and also showed numerous episodes of ectopic atrial tachycardia (14 episodes lasting up to 9 seconds in duration and frequent PACs 10% of all beats).  There were no significant ventricular arrhythmias noted.  The echocardiogram performed August 16, 2019 showed normal LVEF 65-70%, mild LVH, diastolic dysfunction with elevated filling pressures, degenerative aortic valve changes with mild-moderate stenosis (mean gradient 18 mmHg, dimensionless index 0.32, calculated aortic valve area 1.12 cm.  He notes reduced exercise tolerance, easily becomes tired with physical activity.  Does not have orthopnea, PND or leg edema.  Palpitations are always more noticeable if he lies on his left side.  Angina has not been a problem recently.  He also notices some lightheadedness and reports that his diastolic blood pressure  is rather low (in the 50s).  He wonders whether he should cut back his amlodipine further.  Past Medical History:  Diagnosis Date  . 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 Arthritis    "lower back" (07/29/2016)  . ASCVD (arteriosclerotic cardiovascular disease)    -luminal irregularities in 1998 and 2005; normal EF  . Benign prostatic hypertrophy    status post transurethral resection of the prostate  . CAD (coronary artery disease)    a. Cath 07/29/16 99% ostial RCA & 70% pRCA s/p cutting balloon angioplasty and single SYNERGY DES 2.5X28. 100% very Distal 1st RPLB lesion --> the occlusion site moved further distal with guidewire advancement;  40% ostial LAD; 50% Ost Cx to Prox Cx lesion.  . Coronary artery disease   . Dysphagia   . Erectile dysfunction   . GERD (gastroesophageal reflux disease)   . Heart murmur   . History of hiatal hernia   . History of kidney stones   . Hyperlipidemia   . Hypertension   . Seasonal allergies   . Tobacco abuse    20 pack years; discontinued 1995  . Type II diabetes mellitus ([place])     Past Surgical History:  Procedure Laterality Date  . APPENDECTOMY    . CATARACT EXTRACTION W/ INTRAOCULAR LENS  IMPLANT, BILATERAL Bilateral   . COLONOSCOPY  07/20/2011   Dr. Gala Romney: multiple hyperplastic polyps. Surveillance 2018  . COLONOSCOPY N/A 06/30/2016   Procedure: COLONOSCOPY;  Surgeon: Daneil Dolin, MD;  [facility];  Service: Endoscopy;  Laterality: N/A;  10:00am  . COLONOSCOPY W/ BIOPSIES AND POLYPECTOMY  07/08/08, 06/2011   Dr Madolyn Frieze papilla, diminutive rectal polyp ablated the, left-sided  diverticula, piecemeal polypectomy of hepatic flexure adenomatous polyp, diminutive polyps near hepatic flexure ablated  . CORONARY ANGIOPLASTY WITH STENT PLACEMENT  07/29/2016  . CORONARY STENT INTERVENTION N/A 07/29/2016   Procedure: Coronary Stent Intervention;  Surgeon: Leonie Man, MD;  Location: [place] [facility];  Service: Cardiovascular;   Laterality: N/A;  RCA  . ESOPHAGOGASTRODUODENOSCOPY  2013   erosive reflux esophagitis, s/p empiric dilation. chronic duodenitis.   [name] LEFT HEART CATH AND CORONARY ANGIOGRAPHY N/A 07/29/2016   Procedure: Left Heart Cath and Coronary Angiography;  Surgeon: Leonie Man, MD;  Location: [place] [facility];  Service: Cardiovascular;  Laterality: N/A;  . NASAL SEPTUM SURGERY    . NASAL SINUS SURGERY     "cleaned out my sinus"  . PERIPHERAL VASCULAR CATHETERIZATION N/A 11/26/2015   Procedure: Abdominal Aortogram w/Lower Extremity;  Surgeon: Wellington Hampshire, MD;  Location: [place] [facility];  Service: Cardiovascular;  Laterality: N/A;  . TONSILLECTOMY    . TRANSURETHRAL RESECTION OF PROSTATE  2009    Current Medications: Current Meds  Medication Sig  . alfuzosin (UROXATRAL) 10 MG 24 hr tablet Take 10 mg by mouth daily with breakfast.  . amLODipine (NORVASC) 2.5 MG tablet Take 1 tablet (2.5 mg total) by mouth daily.  [name] aspirin EC 81 MG tablet Take 81 mg by mouth at bedtime.   [name] atorvastatin (LIPITOR) 10 MG tablet Take 1 tablet (10 mg total) by mouth once a week. (Patient taking differently: Take 10 mg by mouth every Tuesday. )  . Evolocumab (REPATHA SURECLICK) 767 MG/ML SOAJ Inject 140 mg into the skin every 14 (fourteen) days.  . fluticasone furoate-vilanterol (BREO ELLIPTA) 100-25 MCG/INH AEPB Inhale 1 puff into the lungs daily.  [name] LANTUS SOLOSTAR 100 UNIT/ML Solostar Pen Inject 26 Units into the skin daily. (Patient taking differently: Inject 28 Units into the skin at bedtime. )  . losartan (COZAAR) 25 MG tablet TAKE 1 TABLET(25 MG) BY MOUTH DAILY (Patient taking differently: Take 25 mg by mouth in the morning. )  . metFORMIN (GLUCOPHAGE) 1000 MG tablet Take 1 tablet (1,000 mg total) by mouth 2 (two) times daily with a meal.  . metoprolol tartrate (LOPRESSOR) 25 MG tablet TAKE 1 TABLET(25 MG) BY MOUTH TWICE DAILY (Patient taking differently: Take 25 mg by mouth 2 (two) times daily. )  .  montelukast (SINGULAIR) 10 MG tablet Take 1 tablet (10 mg total) by mouth at bedtime.  . MULTIPLE VITAMIN PO Take 1 tablet by mouth daily.  . nitroGLYCERIN (NITROSTAT) 0.4 MG SL tablet Place 1 tablet (0.4 mg total) under the tongue every 5 (five) minutes as needed for chest pain.  . pantoprazole (PROTONIX) 40 MG tablet TAKE 1 TABLET(40 MG) BY MOUTH DAILY (Patient taking differently: Take 40 mg by mouth in the morning. TAKE 1 TABLET(40 MG) BY MOUTH DAILY)  . sitaGLIPtin (JANUVIA) 100 MG tablet Take 1 tablet (100 mg total) by mouth daily. (Patient taking differently: Take 100 mg by mouth in the morning. )  . tadalafil (CIALIS) 20 MG tablet Take 0.5-1 tablets (10-20 mg total) by mouth every other day as needed for erectile dysfunction.  . temazepam (RESTORIL) 15 MG capsule Take 1 capsule (15 mg total) by mouth at bedtime as needed for sleep.     Allergies:   Plavix [clopidogrel bisulfate], Oxycontin [oxycodone hcl], Penicillins, Pioglitazone, Prednisone, and Simvastatin   Social History   Socioeconomic History  . Marital status: Married    Spouse name: Not on file  .  Number of children: 2  . Years of education: Not on file  . Highest education level: Not on file  Occupational History  . Occupation: Dietitian, 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    Comment: [employer]  Tobacco Use  . Smoking status: Former Smoker    Packs/day: 1.00    Years: 15.00    Pack years: 15.00    Types: Cigarettes    Start date: 03/08/1965    Quit date: 07/07/1993    Years since quitting: 26.2  . Smokeless tobacco: Never Used  Vaping Use  . Vaping Use: Never used  Substance and Sexual Activity  . Alcohol use: No    Alcohol/week: 0.0 standard drinks    Comment: 07/29/2016 "last drink was 4 wks ago; had a beer"  . Drug use: No  . Sexual activity: Not Currently  Other Topics Concern  . Not on file  Social History Narrative   5 grandchildren    Social Determinants of Health   Financial Resource  Strain:   . Difficulty of Paying Living Expenses:   Food Insecurity:   . Worried About Charity fundraiser in the Last Year:   . Arboriculturist in the Last Year:   Transportation Needs:   . Film/video editor (Medical):   [name] Lack of Transportation (Non-Medical):   Physical Activity:   . Days of Exercise per Week:   . Minutes of Exercise per Session:   Stress:   . Feeling of Stress :   Social Connections:   . Frequency of Communication with Friends and Family:   . Frequency of Social Gatherings with Friends and Family:   . Attends Religious Services:   . Active Member of Clubs or Organizations:   . Attends Archivist Meetings:   [name] Marital Status:      Family History: The patient's family history includes Colon cancer (age of onset: 66) in his sister; Ovarian cancer in his mother.  ROS:   Please see the history of present illness.    All other systems reviewed and are negative.  EKGs/Labs/Other Studies Reviewed:    The following studies were reviewed today: Echo 08/16/2019 1. Left ventricular ejection fraction, by estimation, is 65 to 70%. The  left ventricle has normal function. The left ventricle has no regional  wall motion abnormalities. There is mild concentric left ventricular  hypertrophy. Left ventricular diastolic  parameters are indeterminate. Elevated left ventricular end-diastolic  pressure. The average left ventricular global longitudinal strain is -21.8  %. The global longitudinal strain is normal.  2. Right ventricular systolic function is normal. The right ventricular  size is normal. Tricuspid regurgitation signal is inadequate for assessing  PA pressure.  3. The mitral valve is normal in structure. Mild mitral valve  regurgitation. No evidence of mitral stenosis.  4. The aortic valve has an indeterminant number of cusps. Aortic valve  regurgitation is trivial. Mild to moderate aortic valve stenosis. Aortic  valve area, by VTI measures  1.12 cm. Aortic valve mean gradient measures  18.0 mmHg. Aortic valve Vmax  measures 2.68 m/s. DI 0.32.  5. Aortic dilatation noted. 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  6. The inferior vena cava is normal in size with greater than 50%  respiratory variability, suggesting right atrial pressure of 3 mmHg.  Event monitor 2 weeks 09/20/2019 Normal sinus rhythm with an average heart rate of 75 bpm. 14 runs of SVT, the longest lasted 9.4 seconds  with a rate of 134 bpm. 3-second pause which happened at 11 in the morning.  This was a triggered event but symptoms were not specified. Frequent PACs with a burden of 10.1%.  Some of the triggered events correlated with PACs. Rare PVCs with a burden of less than 1%.   EKG:  EKG is ordered today.  The ekg ordered today demonstrates sinus rhythm with a single PAC, rate around 60 bpm, incomplete right bundle branch block and left anterior fascicular bloc position abnormalities, QTC 418 ms k, QS pattern in leads V1 and V2, no ischemic Recent Labs: 03/14/2019: ALT 23 08/03/2019: BUN 25; Creatinine, Ser 0.84; Potassium 3.8; Sodium 138 08/07/2019: Hemoglobin 13.6; Platelets 247.0; TSH 3.17  Recent Lipid Panel    Component Value Date/Time   CHOL 71 08/07/2019 1108   CHOL 75 (L) 02/06/2019 1052   TRIG 70.0 08/07/2019 1108   TRIG 96 03/12/2008 0000   HDL 35.30 (L) 08/07/2019 1108   HDL 37 (L) 02/06/2019 1052   CHOLHDL 2 08/07/2019 1108   VLDL 14.0 08/07/2019 1108   LDLCALC 22 08/07/2019 1108   LDLCALC 24 02/06/2019 1052   LDLCALC 91 03/12/2008 0000    Physical Exam:    VS:  BP (!) 129/55   Pulse 60   Ht 5\' 7"  (1.702 m)   Wt 180 lb 3.2 oz (81.7 kg)   SpO2 97%   BMI 28.22 kg/m     Wt Readings from Last 3 Encounters:  10/01/19 180 lb 3.2 oz (81.7 kg)  08/28/19 182 lb 12.8 oz (82.9 kg)  08/14/19 179 lb 12.8 oz (81.6 kg)     GEN: Appears younger than stated age.  Well nourished, well developed in no acute  distress HEENT: Normal NECK: No JVD; No carotid bruits LYMPHATICS: No lymphadenopathy CARDIAC: Occasional ectopy on a background of RRR, 2/6 early peaking aortic ejection murmur, no diastolic murmurs, rubs, gallops.  Extremities are warm but there are no pulses palpable in his feet.  Normal radial and ulnar pulses. RESPIRATORY:  Clear to auscultation without rales, wheezing or rhonchi  ABDOMEN: Soft, non-tender, non-distended MUSCULOSKELETAL:  No edema; No deformity  SKIN: Warm and dry NEUROLOGIC:  Alert and oriented x 3 PSYCHIATRIC:  Normal affect   ASSESSMENT:    1. Tachycardia-bradycardia syndrome ([place])   2. PAT (paroxysmal atrial tachycardia) ([place])   3. Aortic valve stenosis, nonrheumatic   4. PAD (peripheral artery disease) ([place])   5. Coronary artery disease of native artery of native heart with stable angina pectoris ([place])   6. Bilateral carotid artery stenosis   7. Mixed hyperlipidemia   8. DM (diabetes mellitus) type II uncontrolled, periph vascular disorder ([place])   9. Essential hypertension   10. Pre-op testing    PLAN:    In order of problems listed above:  1. Tachycardia-bradycardia syndrome: He has evidence of both recurrent symptomatic paroxysmal atrial tachycardia and dizziness related to sinus pauses up to 3 seconds in duration.  He also has evidence of intraventricular conduction abnormalities and may at some point require ventricular pacing.  Unable to increase the dose of beta-blocker without backup pacemaker.  We discussed the pros and cons of having a permanent pacemaker, the details of implantation, possible adverse events including but not limited to lead dislodgment, need for reoperation, pneumothorax, need for chest tube, lead perforation, infection.  Reviewed long-term management and follow-up.  He asked several questions about the procedure and wishes to proceed. 2. PAT: We will be able to increase the dose  of beta-blocker after device implantation. 3. AS:  Mild to moderate and unlikely to be contributing to his dizziness. 4. PAD: Not currently limited by claudication.  He did not tolerate treatment with cilostazol which increased palpitations. 5. CAD: Does not have angina pectoris.  On 2 different antianginal medications (he has a known occluded posterolateral ventricular branch). 6. Bilateral moderate carotid artery stenosis: Scheduled by Dr. Fletcher Anon for a follow-up duplex ultrasound June 2022 7. HLP: Excellent LDL cholesterol level on PCSK9 inhibitor. 8. DM: Take half of his usual dose of insulin on the day of pacemaker implantation.  Glycemic control is suboptimal with most recent hemoglobin A1c 8.7%. 9. HTN: His diastolic blood pressure is relatively low.  Will wait and see if his dizziness resolves after pacemaker implantation.  Amlodipine may be having some benefit as an antianginal.    Medication Adjustments/Labs and Tests Ordered: Current medicines are reviewed at length with the patient today.  Concerns regarding medicines are outlined above.  Orders Placed This Encounter  Procedures  . CBC With Differential  . Basic metabolic panel  . EKG 12-Lead   No orders of the defined types were placed in this encounter.   Patient Instructions  Medications: Your physician recommends that you continue on your current medications as directed. Please refer to the Current Medication list given to you today.     * If you need a refill on your cardiac medications before your next appointment, please call your pharmacy. *  Testing/Procedures: Your physician has recommended that you have a pacemaker inserted. A pacemaker is a small device that is placed under the skin of your chest or abdomen to help control abnormal heart rhythms. This device uses electrical pulses to prompt the heart to beat at a normal rate. Pacemakers are used to treat heart rhythms that are too slow. Wire (leads) are attached to the pacemaker that goes into the chambers of you  heart. This is done in the hospital and usually requires and overnight stay. Please follow the instructions below, located under the special instructions section.  Follow-Up: Your physician recommends that you schedule a wound check appointment 10-14 days, after your procedure on 10/15/19, with the device clinic.   Your physician recommends that you schedule a follow up appointment in 91 days, after your procedure on 10/15/19, with Dr. Sallyanne Kuster.  Thank you for choosing [facility]!!      Any Other Special Instructions Will Be Listed Below (If Applicable).     Implantable Device Instructions  You are scheduled for a Permanent Transvenous Pacemaker on 10/15/19  with Dr. Sallyanne Kuster.  1.   Please arrive at the [facility], Entrance "A"  at [facility] at  11:30 am on the day of your procedure. (The address is [address])  2. Do not eat or drink after midnight the night before your procedure.  3.  Labs:   Your provider would like for you to have labs today: BMET AND CBC  You will need to have the coronavirus test completed prior to your procedure. An appointment has been made at 2:45 pm  on 10/11/19. This is a Drive Up Visit at [address], [place], [place] [address]. Please tell them that you are there for procedure testing. Stay in your car and someone will be with you shortly. Please make sure to have all other labs completed before this test because you will need to stay quarantined until your procedure.  4.  Medications: TAKE half the dose  of the Lantus the night before the procedure. Hold all diabetic medication the morning of the procedure (Metformin and Januvia)  5.  Plan for an overnight stay.  Bring your insurance cards and a list of you medications.  6.  Wash your chest and neck with surgical scrub the evening before and the morning of your procedure.  Rinse well. Please review the surgical scrub instruction sheet given to you. You may pick this up  at the [facility] office or any drug store.  7. Your chest will need to be shaved prior to this procedure (if needed). We ask that you do this yourself at home 1 to 2 days before or if uncomfortable/unable to do yourself, then it will be performed by the hospital staff the day of.                                                                                                                * If you have ant questions after you get home, please call Lattie Haw, RN at [phone]  * Every attempt is made to prevent procedures from being rescheduled.  Due to the nature of  Electrophysiology, rescheduling can happen.  The physician is always aware and directs the staff when this occurs.     Pacemaker Implantation, Adult Pacemaker implantation is a procedure to place a pacemaker inside your chest. A pacemaker is a small computer that sends electrical signals to the heart and helps your heart beat normally. A pacemaker also stores information about your heart rhythms. You may need pacemaker implantation if you:  Have a slow heartbeat (bradycardia).  Faint (syncope).  Have shortness of breath (dyspnea) due to heart problems.  The pacemaker attaches to your heart through a wire, called a lead. Sometimes just one lead is needed. Other times, there will be two leads. There are two types of pacemakers:  Transvenous pacemaker. This type is placed under the skin or muscle of your chest. The lead goes through a vein in the chest area to reach the inside of the heart.  Epicardial pacemaker. This type is placed under the skin or muscle of your chest or belly. The lead goes through your chest to the outside of the heart.  Tell a health care provider about:  Any allergies you have.  All medicines you are taking, including vitamins, herbs, eye drops, creams, and over-the-counter medicines.  Any problems you or family members have had with anesthetic medicines.  Any blood or bone disorders you have.  Any  surgeries you have had.  Any medical conditions you have.  Whether you are pregnant or may be pregnant. What are the risks? Generally, this is a safe procedure. However, problems may occur, including:  Infection.  Bleeding.  Failure of the pacemaker or the lead.  Collapse of a lung or bleeding into a lung.  Blood clot inside a blood vessel with a lead.  Damage to the heart.  Infection inside the heart (endocarditis).  Allergic reactions to medicines.  What happens before the procedure?  Staying hydrated Follow instructions from your health care provider about hydration, which may include:  Up to 2 hours before the procedure - you may continue to drink clear liquids, such as water, clear fruit juice, black coffee, and plain tea.  Eating and drinking restrictions Follow instructions from your health care provider about eating and drinking, which may include:  8 hours before the procedure - stop eating heavy meals or foods such as meat, fried foods, or fatty foods.  6 hours before the procedure - stop eating light meals or foods, such as toast or cereal.  6 hours before the procedure - stop drinking milk or drinks that contain milk.  2 hours before the procedure - stop drinking clear liquids.  Medicines  Ask your health care provider about: ? Changing or stopping your regular medicines. This is especially important if you are taking diabetes medicines or blood thinners. ? Taking medicines such as aspirin and ibuprofen. These medicines can thin your blood. Do not take these medicines before your procedure if your health care provider instructs you not to.  You may be given antibiotic medicine to help prevent infection. General instructions  You will have a heart evaluation. This may include an electrocardiogram (ECG), chest X-ray, and heart imaging (echocardiogram,  or echo) tests.  You will have blood tests.  Do not use any products that contain nicotine or tobacco,  such as cigarettes and e-cigarettes. If you need help quitting, ask your health care provider.  Plan to have someone take you home from the hospital or clinic.  If you will be going home right after the procedure, plan to have someone with you for 24 hours.  Ask your health care provider how your surgical site will be marked or identified. What happens during the procedure?  To reduce your risk of infection: ? Your health care team will wash or sanitize their hands. ? Your skin will be washed with soap. ? Hair may be removed from the surgical area.  An IV tube will be inserted into one of your veins.  You will be given one or more of the following: ? A medicine to help you relax (sedative). ? A medicine to numb the area (local anesthetic). ? A medicine to make you fall asleep (general anesthetic).  If you are getting a transvenous pacemaker: ? An incision will be made in your upper chest. ? A pocket will be made for the pacemaker. It may be placed under the skin or between layers of muscle. ? The lead will be inserted into a blood vessel that returns to the heart. ? While X-rays are taken by an imaging machine (fluoroscopy), the lead will be advanced through the vein to the inside of your heart. ? The other end of the lead will be tunneled under the skin and attached to the pacemaker.  If you are getting an epicardial pacemaker: ? An incision will be made near your ribs or breastbone (sternum) for the lead. ? The lead will be attached to the outside of your heart. ? Another incision will be made in your chest or upper belly to create a pocket for the pacemaker. ? The free end of the lead will be tunneled under the skin and attached to the pacemaker.  The transvenous or epicardial pacemaker will be tested. Imaging studies may be done to check the lead position.  The incisions will be closed with stitches (sutures), adhesive strips, or skin glue.  Bandages (dressing) will be  placed  over the incisions. The procedure may vary among health care providers and hospitals. What happens after the procedure?  Your blood pressure, heart rate, breathing rate, and blood oxygen level will be monitored until the medicines you were given have worn off.  You will be given antibiotics and pain medicine.  ECG and chest x-rays will be done.  You will wear a continuous type of ECG (Holter monitor) to check your heart rhythm.  Your health care provider will program the pacemaker.  Do not drive for 24 hours if you received a sedative. This information is not intended to replace advice given to you by your health care provider. Make sure you discuss any questions you have with your health care provider. Document Released: 01/29/2002 Document Revised: 08/29/2015 Document Reviewed: 07/23/2015 [employer]vier Interactive Patient Education  2018 [employer].     Pacemaker Implantation, Adult, Care After This sheet gives you information about how to care for yourself after your procedure. Your health care provider may also give you more specific instructions. If you have problems or questions, contact your health care provider. What can I expect after the procedure? After the procedure, it is common to have:  Mild pain.  Slight bruising.  Some swelling over the incision.  A slight bump over the skin where the device was placed. Sometimes, it is possible to feel the device under the skin. This is normal.  Follow these instructions at home: Medicines  Take over-the-counter and prescription medicines only as told by your health care provider.  If you were prescribed an antibiotic medicine, take it as told by your health care provider. Do not stop taking the antibiotic even if you start to feel better. Wound care  Do not remove the bandage on your chest until directed to do so by your health care provider.  After your bandage is removed, you may see pieces of tape called skin  adhesive strips over the area where the cut was made (incision site). Let them fall off on their own.  Check the incision site every day to make sure it is not infected, bleeding, or starting to pull apart.  Do not use lotions or ointments near the incision site unless directed to do so.  Keep the incision area clean and dry for 2-3 days after the procedure or as directed by your health care provider. It takes several weeks for the incision site to completely heal.  Do not take baths, swim, or use a hot tub for 7-10 days or as otherwise directed by your health care provider. Activity  Do not drive or use heavy machinery while taking prescription pain medicine.  Do not drive for 24 hours if you were given a medicine to help you relax (sedative).  Check with your health care provider before you start to drive or play sports.  Avoid sudden jerking, pulling, or chopping movements that pull your upper arm far away from your body. Avoid these movements for at least 6 weeks or as long as told by your health care provider.  Do not lift your upper arm above your shoulders for at least 6 weeks or as long as told by your health care provider. This means no tennis, golf, or swimming.  You may go back to work when your health care provider says it is okay. Pacemaker care  You may be shown how to transfer data from your pacemaker through the phone to your health care provider.  Always let all health care providers know about  your pacemaker before you have any medical procedures or tests.  Wear a medical ID bracelet or necklace stating that you have a pacemaker. Carry a pacemaker ID card with you at all times.  Your pacemaker battery will last for 5-15 years. Routine checks by your health care provider will let the health care provider know when the battery is starting to run down. The pacemaker will need to be replaced when the battery starts to run down.  Do not use amateur Nurse, children's. Other electrical devices are safe to use, including power tools, lawn mowers, and speakers. If you are unsure of whether something is safe to use, ask your health care provider.  When using your cell phone, hold it to the ear opposite the pacemaker. Do not leave your cell phone in a pocket over the pacemaker.  Avoid places or objects that have a strong electric or magnetic field, including: ? [employer]. When at the airport, let officials know that you have a pacemaker. ? Power plants. ? Large electrical generators. ? Radiofrequency transmission towers, such as cell phone and radio towers. General instructions  Weigh yourself every day. If you suddenly gain weight, fluid may be building up in your body.  Keep all follow-up visits as told by your health care provider. This is important. Contact a health care provider if:  You gain weight suddenly.  Your legs or feet swell.  It feels like your heart is fluttering or skipping beats (heart palpitations).  You have chills or a fever.  You have more redness, swelling, or pain around your incisions.  You have more fluid or blood coming from your incisions.  Your incisions feel warm to the touch.  You have pus or a bad smell coming from your incisions. Get help right away if:  You have chest pain.  You have trouble breathing or are short of breath.  You become extremely tired.  You are light-headed or you faint. This information is not intended to replace advice given to you by your health care provider. Make sure you discuss any questions you have with your health care provider. Document Released: 08/28/2004 Document Revised: 11/21/2015 Document Reviewed: 11/21/2015 Elsevier Interactive Patient Education  2018 [employer] Discharge Instructions for  Pacemaker/Defibrillator Patients  ACTIVITY No heavy lifting or vigorous activity with your left/right arm for 6 to 8  weeks.  Do not raise your left/right arm above your head for one week.  Gradually raise your affected arm as drawn below.           __  NO DRIVING for     ; you may begin driving on     .  WOUND CARE - Keep the wound area clean and dry.  Do not get this area wet for one week. No showers for one week; you may shower on     . - The tape/steri-strips on your wound will fall off; do not pull them off.  No bandage is needed on the site.  DO  NOT apply any creams, oils, or ointments to the wound area. - If you notice any drainage or discharge from the wound, any swelling or bruising at the site, or you develop a fever > 101? F after you are discharged home, call the office at once.  SPECIAL INSTRUCTIONS - You are still able to use cellular telephones; use the ear opposite the side where you have your pacemaker/defibrillator.  Avoid carrying  your cellular phone near your device. - When traveling through airports, show security personnel your identification card to avoid being screened in the metal detectors.  Ask the security personnel to use the hand wand. - Avoid arc welding equipment, MRI testing (magnetic resonance imaging), TENS units (transcutaneous nerve stimulators).  Call the office for questions about other devices. - Avoid electrical appliances that are in poor condition or are not properly grounded. - Microwave ovens are safe to be near or to operate.  ADDITIONAL INFORMATION FOR DEFIBRILLATOR PATIENTS SHOULD YOUR DEVICE GO OFF: - If your device goes off ONCE and you feel fine afterward, notify the device clinic nurses. - If your device goes off ONCE and you do not feel well afterward, call 911. - If your device goes off TWICE, call 911. - If your device goes off [place], call 911.  DO NOT DRIVE YOURSELF OR A FAMILY MEMBER WITH A DEFIBRILLATOR TO THE HOSPITAL--CALL 911.    [facility] - Preparing For Surgery  Before surgery, you can play an important role. Because  skin is not sterile, your skin needs to be as free of germs as possible. You can reduce the number of germs on your skin by washing with CHG (chlorahexidine gluconate) Soap before surgery.  CHG is an antiseptic cleaner which kills germs and bonds with the skin to continue killing germs even after washing.   Please do not use if you have an allergy to CHG or antibacterial soaps.  If your skin becomes reddened/irritated stop using the CHG.   Do not shave (including legs and underarms) for at least 48 hours prior to first CHG shower.  It is OK to shave your face.  Please follow these instructions carefully:  1.  Shower the night before surgery and the morning of surgery with CHG.  2.  If you choose to wash your hair, wash your hair first as usual with your normal shampoo.  3.  After you shampoo, rinse your hair and body thoroughly to remove the shampoo.  4.  Use CHG as you would any other liquid soap.  You can apply CHG directly to the skin and wash gently with a clean washcloth. 5.  Apply the CHG Soap to your body ONLY FROM THE NECK DOWN.  Do not use on open wounds or open sores.  Avoid contact with your eyes, ears, mouth and genitals (private parts).  Wash genitals (private parts) with your normal soap.  6.  Wash thoroughly, paying special attention to the area where your surgery will be performed.  7.  Thoroughly rise your body with warm water from the neck down.   8.  DO NOT shower/wash with your normal soap after using and rinsing off the CHG soap.  9.  Pat yourself dry with a clean towel.           10.  Wear clean pajamas.           11.  Place clean sheets on your bed the night of your first shower and do not sleep with pets.  Day of Surgery: Do not apply any deodorants/lotions.  Please wear clean clothes to the hospital/surgery center.         Signed, Sanda Klein, MD  10/01/2019 2:16 PM    [place]

## 2019-10-01 NOTE — Patient Instructions (Signed)
Medications: Your physician recommends that you continue on your current medications as directed. Please refer to the Current Medication list given to you today.     * If you need a refill on your cardiac medications before your next appointment, please call your pharmacy. *  Testing/Procedures: Your physician has recommended that you have a pacemaker inserted. A pacemaker is a small device that is placed under the skin of your chest or abdomen to help control abnormal heart rhythms. This device uses electrical pulses to prompt the heart to beat at a normal rate. Pacemakers are used to treat heart rhythms that are too slow. Wire (leads) are attached to the pacemaker that goes into the chambers of you heart. This is done in the hospital and usually requires and overnight stay. Please follow the instructions below, located under the special instructions section.  Follow-Up: Your physician recommends that you schedule a wound check appointment 10-14 days, after your procedure on 10/15/19, with the device clinic.   Your physician recommends that you schedule a follow up appointment in 91 days, after your procedure on 10/15/19, with Dr. Sallyanne Kuster.  Thank you for choosing [facility]!!      Any Other Special Instructions Will Be Listed Below (If Applicable).     Implantable Device Instructions  You are scheduled for a Permanent Transvenous Pacemaker on 10/15/19  with Dr. Sallyanne Kuster.  1.   Please arrive at the [facility], Entrance "A"  at [facility] at  11:30 am on the day of your procedure. (The address is [address]  2. Do not eat or drink after midnight the night before your procedure.  3.  Labs:   Your provider would like for you to have labs today: BMET AND CBC  You will need to have the coronavirus test completed prior to your procedure. An appointment has been made at 2:45 pm  on 10/11/19. This is a Drive Up Visit at [address], [place], [place]  [address]. Please tell them that you are there for procedure testing. Stay in your car and someone will be with you shortly. Please make sure to have all other labs completed before this test because you will need to stay quarantined until your procedure.  4.  Medications: TAKE half the dose of the Lantus the night before the procedure. Hold all diabetic medication the morning of the procedure (Metformin and Januvia)  5.  Plan for an overnight stay.  Bring your insurance cards and a list of you medications.  6.  Wash your chest and neck with surgical scrub the evening before and the morning of your procedure.  Rinse well. Please review the surgical scrub instruction sheet given to you. You may pick this up at the [facility] office or any drug store.  7. Your chest will need to be shaved prior to this procedure (if needed). We ask that you do this yourself at home 1 to 2 days before or if uncomfortable/unable to do yourself, then it will be performed by the hospital staff the day of.                                                                                                                *  If you have ant questions after you get home, please call Lattie Haw, RN at [phone]  * Every attempt is made to prevent procedures from being rescheduled.  Due to the nature of  Electrophysiology, rescheduling can happen.  The physician is always aware and directs the staff when this occurs.     Pacemaker Implantation, Adult Pacemaker implantation is a procedure to place a pacemaker inside your chest. A pacemaker is a small computer that sends electrical signals to the heart and helps your heart beat normally. A pacemaker also stores information about your heart rhythms. You may need pacemaker implantation if you:  Have a slow heartbeat (bradycardia).  Faint (syncope).  Have shortness of breath (dyspnea) due to heart problems.  The pacemaker attaches to your heart through a wire, called a lead. Sometimes  just one lead is needed. Other times, there will be two leads. There are two types of pacemakers:  Transvenous pacemaker. This type is placed under the skin or muscle of your chest. The lead goes through a vein in the chest area to reach the inside of the heart.  Epicardial pacemaker. This type is placed under the skin or muscle of your chest or belly. The lead goes through your chest to the outside of the heart.  Tell a health care provider about:  Any allergies you have.  All medicines you are taking, including vitamins, herbs, eye drops, creams, and over-the-counter medicines.  Any problems you or family members have had with anesthetic medicines.  Any blood or bone disorders you have.  Any surgeries you have had.  Any medical conditions you have.  Whether you are pregnant or may be pregnant. What are the risks? Generally, this is a safe procedure. However, problems may occur, including:  Infection.  Bleeding.  Failure of the pacemaker or the lead.  Collapse of a lung or bleeding into a lung.  Blood clot inside a blood vessel with a lead.  Damage to the heart.  Infection inside the heart (endocarditis).  Allergic reactions to medicines.  What happens before the procedure? Staying hydrated Follow instructions from your health care provider about hydration, which may include:  Up to 2 hours before the procedure - you may continue to drink clear liquids, such as water, clear fruit juice, black coffee, and plain tea.  Eating and drinking restrictions Follow instructions from your health care provider about eating and drinking, which may include:  8 hours before the procedure - stop eating heavy meals or foods such as meat, fried foods, or fatty foods.  6 hours before the procedure - stop eating light meals or foods, such as toast or cereal.  6 hours before the procedure - stop drinking milk or drinks that contain milk.  2 hours before the procedure - stop  drinking clear liquids.  Medicines  Ask your health care provider about: ? Changing or stopping your regular medicines. This is especially important if you are taking diabetes medicines or blood thinners. ? Taking medicines such as aspirin and ibuprofen. These medicines can thin your blood. Do not take these medicines before your procedure if your health care provider instructs you not to.  You may be given antibiotic medicine to help prevent infection. General instructions  You will have a heart evaluation. This may include an electrocardiogram (ECG), chest X-ray, and heart imaging (echocardiogram,  or echo) tests.  You will have blood tests.  Do not use any products that contain nicotine or tobacco, such as cigarettes and e-cigarettes. If  you need help quitting, ask your health care provider.  Plan to have someone take you home from the hospital or clinic.  If you will be going home right after the procedure, plan to have someone with you for 24 hours.  Ask your health care provider how your surgical site will be marked or identified. What happens during the procedure?  To reduce your risk of infection: ? Your health care team will wash or sanitize their hands. ? Your skin will be washed with soap. ? Hair may be removed from the surgical area.  An IV tube will be inserted into one of your veins.  You will be given one or more of the following: ? A medicine to help you relax (sedative). ? A medicine to numb the area (local anesthetic). ? A medicine to make you fall asleep (general anesthetic).  If you are getting a transvenous pacemaker: ? An incision will be made in your upper chest. ? A pocket will be made for the pacemaker. It may be placed under the skin or between layers of muscle. ? The lead will be inserted into a blood vessel that returns to the heart. ? While X-rays are taken by an imaging machine (fluoroscopy), the lead will be advanced through the vein to the  inside of your heart. ? The other end of the lead will be tunneled under the skin and attached to the pacemaker.  If you are getting an epicardial pacemaker: ? An incision will be made near your ribs or breastbone (sternum) for the lead. ? The lead will be attached to the outside of your heart. ? Another incision will be made in your chest or upper belly to create a pocket for the pacemaker. ? The free end of the lead will be tunneled under the skin and attached to the pacemaker.  The transvenous or epicardial pacemaker will be tested. Imaging studies may be done to check the lead position.  The incisions will be closed with stitches (sutures), adhesive strips, or skin glue.  Bandages (dressing) will be placed over the incisions. The procedure may vary among health care providers and hospitals. What happens after the procedure?  Your blood pressure, heart rate, breathing rate, and blood oxygen level will be monitored until the medicines you were given have worn off.  You will be given antibiotics and pain medicine.  ECG and chest x-rays will be done.  You will wear a continuous type of ECG (Holter monitor) to check your heart rhythm.  Your health care provider will program the pacemaker.  Do not drive for 24 hours if you received a sedative. This information is not intended to replace advice given to you by your health care provider. Make sure you discuss any questions you have with your health care provider. Document Released: 01/29/2002 Document Revised: 08/29/2015 Document Reviewed: 07/23/2015 Elsevier Interactive Patient Education  2018 [employer].     Pacemaker Implantation, Adult, Care After This sheet gives you information about how to care for yourself after your procedure. Your health care provider may also give you more specific instructions. If you have problems or questions, contact your health care provider. What can I expect after the procedure? After the  procedure, it is common to have:  Mild pain.  Slight bruising.  Some swelling over the incision.  A slight bump over the skin where the device was placed. Sometimes, it is possible to feel the device under the skin. This is normal.  Follow these instructions  at home: Medicines  Take over-the-counter and prescription medicines only as told by your health care provider.  If you were prescribed an antibiotic medicine, take it as told by your health care provider. Do not stop taking the antibiotic even if you start to feel better. Wound care  Do not remove the bandage on your chest until directed to do so by your health care provider.  After your bandage is removed, you may see pieces of tape called skin adhesive strips over the area where the cut was made (incision site). Let them fall off on their own.  Check the incision site every day to make sure it is not infected, bleeding, or starting to pull apart.  Do not use lotions or ointments near the incision site unless directed to do so.  Keep the incision area clean and dry for 2-3 days after the procedure or as directed by your health care provider. It takes several weeks for the incision site to completely heal.  Do not take baths, swim, or use a hot tub for 7-10 days or as otherwise directed by your health care provider. Activity  Do not drive or use heavy machinery while taking prescription pain medicine.  Do not drive for 24 hours if you were given a medicine to help you relax (sedative).  Check with your health care provider before you start to drive or play sports.  Avoid sudden jerking, pulling, or chopping movements that pull your upper arm far away from your body. Avoid these movements for at least 6 weeks or as long as told by your health care provider.  Do not lift your upper arm above your shoulders for at least 6 weeks or as long as told by your health care provider. This means no tennis, golf, or swimming.  You  may go back to work when your health care provider says it is okay. Pacemaker care  You may be shown how to transfer data from your pacemaker through the phone to your health care provider.  Always let all health care providers know about your pacemaker before you have any medical procedures or tests.  Wear a medical ID bracelet or necklace stating that you have a pacemaker. Carry a pacemaker ID card with you at all times.  Your pacemaker battery will last for 5-15 years. Routine checks by your health care provider will let the health care provider know when the battery is starting to run down. The pacemaker will need to be replaced when the battery starts to run down.  Do not use amateur Chief of Staff. Other electrical devices are safe to use, including power tools, lawn mowers, and speakers. If you are unsure of whether something is safe to use, ask your health care provider.  When using your cell phone, hold it to the ear opposite the pacemaker. Do not leave your cell phone in a pocket over the pacemaker.  Avoid places or objects that have a strong electric or magnetic field, including: ? Airport Herbalist. When at the airport, let officials know that you have a pacemaker. ? Power plants. ? Large electrical generators. ? Radiofrequency transmission towers, such as cell phone and radio towers. General instructions  Weigh yourself every day. If you suddenly gain weight, fluid may be building up in your body.  Keep all follow-up visits as told by your health care provider. This is important. Contact a health care provider if:  You gain weight suddenly.  Your legs or  feet swell.  It feels like your heart is fluttering or skipping beats (heart palpitations).  You have chills or a fever.  You have more redness, swelling, or pain around your incisions.  You have more fluid or blood coming from your incisions.  Your incisions feel warm to the  touch.  You have pus or a bad smell coming from your incisions. Get help right away if:  You have chest pain.  You have trouble breathing or are short of breath.  You become extremely tired.  You are light-headed or you faint. This information is not intended to replace advice given to you by your health care provider. Make sure you discuss any questions you have with your health care provider. Document Released: 08/28/2004 Document Revised: 11/21/2015 Document Reviewed: 11/21/2015 Elsevier Interactive Patient Education  2018 [place] Discharge Instructions for  Pacemaker/Defibrillator Patients  ACTIVITY No heavy lifting or vigorous activity with your left/right arm for 6 to 8 weeks.  Do not raise your left/right arm above your head for one week.  Gradually raise your affected arm as drawn below.           __  NO DRIVING for     ; you may begin driving on     .  WOUND CARE - Keep the wound area clean and dry.  Do not get this area wet for one week. No showers for one week; you may shower on     . - The tape/steri-strips on your wound will fall off; do not pull them off.  No bandage is needed on the site.  DO  NOT apply any creams, oils, or ointments to the wound area. - If you notice any drainage or discharge from the wound, any swelling or bruising at the site, or you develop a fever > 101? F after you are discharged home, call the office at once.  SPECIAL INSTRUCTIONS - You are still able to use cellular telephones; use the ear opposite the side where you have your pacemaker/defibrillator.  Avoid carrying your cellular phone near your device. - When traveling through airports, show security personnel your identification card to avoid being screened in the metal detectors.  Ask the security personnel to use the hand wand. - Avoid arc welding equipment, MRI testing (magnetic resonance imaging), TENS units (transcutaneous nerve stimulators).  Call the office  for questions about other devices. - Avoid electrical appliances that are in poor condition or are not properly grounded. - Microwave ovens are safe to be near or to operate.  ADDITIONAL INFORMATION FOR DEFIBRILLATOR PATIENTS SHOULD YOUR DEVICE GO OFF: - If your device goes off ONCE and you feel fine afterward, notify the device clinic nurses. - If your device goes off ONCE and you do not feel well afterward, call 911. - If your device goes off TWICE, call 911. - If your device goes off [place], call 911.  DO NOT DRIVE YOURSELF OR A FAMILY MEMBER WITH A DEFIBRILLATOR TO THE HOSPITAL--CALL 911.    [place] - Preparing For Surgery  Before surgery, you can play an important role. Because skin is not sterile, your skin needs to be as free of germs as possible. You can reduce the number of germs on your skin by washing with CHG (chlorahexidine gluconate) Soap before surgery.  CHG is an antiseptic cleaner which kills germs and bonds with the skin to continue killing germs even after washing.   Please do  not use if you have an allergy to CHG or antibacterial soaps.  If your skin becomes reddened/irritated stop using the CHG.   Do not shave (including legs and underarms) for at least 48 hours prior to first CHG shower.  It is OK to shave your face.  Please follow these instructions carefully:  1.  Shower the night before surgery and the morning of surgery with CHG.  2.  If you choose to wash your hair, wash your hair first as usual with your normal shampoo.  3.  After you shampoo, rinse your hair and body thoroughly to remove the shampoo.  4.  Use CHG as you would any other liquid soap.  You can apply CHG directly to the skin and wash gently with a clean washcloth. 5.  Apply the CHG Soap to your body ONLY FROM THE NECK DOWN.  Do not use on open wounds or open sores.  Avoid contact with your eyes, ears, mouth and genitals (private parts).  Wash genitals (private parts) with your  normal soap.  6.  Wash thoroughly, paying special attention to the area where your surgery will be performed.  7.  Thoroughly rise your body with warm water from the neck down.   8.  DO NOT shower/wash with your normal soap after using and rinsing off the CHG soap.  9.  Pat yourself dry with a clean towel.           10.  Wear clean pajamas.           11.  Place clean sheets on your bed the night of your first shower and do not sleep with pets.  Day of Surgery: Do not apply any deodorants/lotions.  Please wear clean clothes to the hospital/surgery center.

## 2019-10-02 LAB — CBC WITH DIFFERENTIAL
Basophils Absolute: 0.1 10*3/uL (ref 0.0–0.2)
Basos: 1 %
EOS (ABSOLUTE): 0.3 10*3/uL (ref 0.0–0.4)
Eos: 3 %
Hematocrit: 41.2 % (ref 37.5–51.0)
Hemoglobin: 13.6 g/dL (ref 13.0–17.7)
Immature Grans (Abs): 0 10*3/uL (ref 0.0–0.1)
Immature Granulocytes: 0 %
Lymphocytes Absolute: 1.8 10*3/uL (ref 0.7–3.1)
Lymphs: 17 %
MCH: 33.7 pg — ABNORMAL HIGH (ref 26.6–33.0)
MCHC: 33 g/dL (ref 31.5–35.7)
MCV: 102 fL — ABNORMAL HIGH (ref 79–97)
Monocytes Absolute: 0.5 10*3/uL (ref 0.1–0.9)
Monocytes: 5 %
Neutrophils Absolute: 7.8 10*3/uL — ABNORMAL HIGH (ref 1.4–7.0)
Neutrophils: 74 %
RBC: 4.03 x10E6/uL — ABNORMAL LOW (ref 4.14–5.80)
RDW: 12.3 % (ref 11.6–15.4)
WBC: 10.6 10*3/uL (ref 3.4–10.8)

## 2019-10-02 LAB — BASIC METABOLIC PANEL
BUN/Creatinine Ratio: 23 (ref 10–24)
BUN: 20 mg/dL (ref 8–27)
CO2: 24 mmol/L (ref 20–29)
Calcium: 9.6 mg/dL (ref 8.6–10.2)
Chloride: 100 mmol/L (ref 96–106)
Creatinine, Ser: 0.88 mg/dL (ref 0.76–1.27)
GFR calc Af Amer: 94 mL/min/{1.73_m2} (ref >59)
GFR calc non Af Amer: 81 mL/min/{1.73_m2} (ref >59)
Glucose: 116 mg/dL — ABNORMAL HIGH (ref 65–99)
Potassium: 5.4 mmol/L — ABNORMAL HIGH (ref 3.5–5.2)
Sodium: 140 mmol/L (ref 134–144)

## 2019-10-04 ENCOUNTER — Other Ambulatory Visit: Payer: Self-pay | Admitting: *Deleted

## 2019-10-04 DIAGNOSIS — I495 Sick sinus syndrome: Secondary | ICD-10-CM

## 2019-10-04 MED ORDER — SODIUM CHLORIDE 0.9% FLUSH: 3.0000 mL | Freq: Two times a day (BID) | INTRAVENOUS | Status: DC

## 2019-10-09 ENCOUNTER — Other Ambulatory Visit: Payer: Self-pay | Admitting: Cardiovascular Disease

## 2019-10-09 NOTE — Telephone Encounter (Signed)
Please review for refill, Thanks !  

## 2019-10-11 ENCOUNTER — Encounter (INDEPENDENT_AMBULATORY_CARE_PROVIDER_SITE_OTHER): Payer: Medicare PPO | Admitting: Ophthalmology

## 2019-10-11 ENCOUNTER — Other Ambulatory Visit (HOSPITAL_COMMUNITY)
Admission: RE | Admit: 2019-10-11 | Discharge: 2019-10-11 | Disposition: A | Discharge status: Home / Self Care | Payer: Medicare PPO | Source: Ambulatory Visit | Attending: Cardiovascular Disease | Admitting: Cardiovascular Disease

## 2019-10-11 DIAGNOSIS — Z01812 Encounter for preprocedural laboratory examination: Secondary | ICD-10-CM | POA: Insufficient documentation

## 2019-10-11 DIAGNOSIS — Z20822 Contact with and (suspected) exposure to covid-19: Secondary | ICD-10-CM | POA: Diagnosis not present

## 2019-10-11 LAB — SARS CORONAVIRUS 2 (TAT 6-24 HRS): SARS Coronavirus 2: NEGATIVE

## 2019-10-15 ENCOUNTER — Ambulatory Visit (HOSPITAL_COMMUNITY): Payer: Medicare PPO

## 2019-10-15 ENCOUNTER — Other Ambulatory Visit: Payer: Self-pay

## 2019-10-15 ENCOUNTER — Ambulatory Visit (HOSPITAL_COMMUNITY)
Admission: RE | Admit: 2019-10-15 | Discharge: 2019-10-15 | Disposition: A | Discharge status: Home / Self Care | Payer: Medicare PPO | Attending: Cardiovascular Disease | Admitting: Cardiovascular Disease

## 2019-10-15 ENCOUNTER — Ambulatory Visit (HOSPITAL_COMMUNITY)
Admission: RE | Disposition: A | Discharge status: Home / Self Care | Payer: Medicare PPO | Source: Home / Self Care | Attending: Cardiovascular Disease

## 2019-10-15 DIAGNOSIS — Z955 Presence of coronary angioplasty implant and graft: Secondary | ICD-10-CM | POA: Insufficient documentation

## 2019-10-15 DIAGNOSIS — Z79899 Other long term (current) drug therapy: Secondary | ICD-10-CM | POA: Diagnosis not present

## 2019-10-15 DIAGNOSIS — I10 Essential (primary) hypertension: Secondary | ICD-10-CM | POA: Insufficient documentation

## 2019-10-15 DIAGNOSIS — Z794 Long term (current) use of insulin: Secondary | ICD-10-CM | POA: Insufficient documentation

## 2019-10-15 DIAGNOSIS — E1151 Type 2 diabetes mellitus with diabetic peripheral angiopathy without gangrene: Secondary | ICD-10-CM | POA: Insufficient documentation

## 2019-10-15 DIAGNOSIS — Z87891 Personal history of nicotine dependence: Secondary | ICD-10-CM | POA: Diagnosis not present

## 2019-10-15 DIAGNOSIS — K219 Gastro-esophageal reflux disease without esophagitis: Secondary | ICD-10-CM | POA: Insufficient documentation

## 2019-10-15 DIAGNOSIS — R1310 Dysphagia, unspecified: Secondary | ICD-10-CM | POA: Insufficient documentation

## 2019-10-15 DIAGNOSIS — I471 Supraventricular tachycardia: Secondary | ICD-10-CM | POA: Diagnosis not present

## 2019-10-15 DIAGNOSIS — E7800 Pure hypercholesterolemia, unspecified: Secondary | ICD-10-CM | POA: Diagnosis not present

## 2019-10-15 DIAGNOSIS — I25118 Atherosclerotic heart disease of native coronary artery with other forms of angina pectoris: Secondary | ICD-10-CM | POA: Insufficient documentation

## 2019-10-15 DIAGNOSIS — I350 Nonrheumatic aortic (valve) stenosis: Secondary | ICD-10-CM | POA: Insufficient documentation

## 2019-10-15 DIAGNOSIS — Z950 Presence of cardiac pacemaker: Secondary | ICD-10-CM

## 2019-10-15 DIAGNOSIS — Z7982 Long term (current) use of aspirin: Secondary | ICD-10-CM | POA: Diagnosis not present

## 2019-10-15 DIAGNOSIS — I6523 Occlusion and stenosis of bilateral carotid arteries: Secondary | ICD-10-CM | POA: Insufficient documentation

## 2019-10-15 DIAGNOSIS — E782 Mixed hyperlipidemia: Secondary | ICD-10-CM | POA: Diagnosis not present

## 2019-10-15 DIAGNOSIS — I495 Sick sinus syndrome: Secondary | ICD-10-CM | POA: Insufficient documentation

## 2019-10-15 HISTORY — PX: PACEMAKER IMPLANT: EP1218

## 2019-10-15 LAB — GLUCOSE, CAPILLARY
Glucose-Capillary: 213 mg/dL — ABNORMAL HIGH (ref 70–99)
Glucose-Capillary: 229 mg/dL — ABNORMAL HIGH (ref 70–99)

## 2019-10-15 SURGERY — PACEMAKER IMPLANT

## 2019-10-15 MED ORDER — CHLORHEXIDINE GLUCONATE 4 % EX LIQD
4.0000 "application " | Freq: Once | CUTANEOUS | Status: DC
  Filled 2019-10-15: qty 60

## 2019-10-15 MED ORDER — SODIUM CHLORIDE 0.9 % IV SOLN
INTRAVENOUS | Status: AC
  Filled 2019-10-15: qty 2

## 2019-10-15 MED ORDER — SODIUM CHLORIDE 0.9 % IV SOLN: INTRAVENOUS | Status: DC

## 2019-10-15 MED ORDER — LIDOCAINE HCL 1 % IJ SOLN
INTRAMUSCULAR | Status: AC
  Filled 2019-10-15: qty 60

## 2019-10-15 MED ORDER — ACETAMINOPHEN 325 MG PO TABS: 325.0000 mg | ORAL_TABLET | ORAL | Status: DC | PRN

## 2019-10-15 MED ORDER — MIDAZOLAM HCL 5 MG/5ML IJ SOLN
INTRAMUSCULAR | Status: AC
  Filled 2019-10-15: qty 5

## 2019-10-15 MED ORDER — FENTANYL CITRATE (PF) 100 MCG/2ML IJ SOLN
INTRAMUSCULAR | Status: DC | PRN
Start: 2019-10-15 — End: 2019-10-15
  Administered 2019-10-15: 25 ug via INTRAVENOUS

## 2019-10-15 MED ORDER — YOU HAVE A PACEMAKER BOOK
Status: AC
  Filled 2019-10-15: qty 1

## 2019-10-15 MED ORDER — VANCOMYCIN HCL IN DEXTROSE 1-5 GM/200ML-% IV SOLN
INTRAVENOUS | Status: AC
  Filled 2019-10-15: qty 200

## 2019-10-15 MED ORDER — SODIUM CHLORIDE 0.9% FLUSH: 3.0000 mL | INTRAVENOUS | Status: DC | PRN

## 2019-10-15 MED ORDER — ONDANSETRON HCL 4 MG/2ML IJ SOLN: 4.0000 mg | Freq: Four times a day (QID) | INTRAMUSCULAR | Status: DC | PRN

## 2019-10-15 MED ORDER — FENTANYL CITRATE (PF) 100 MCG/2ML IJ SOLN
INTRAMUSCULAR | Status: AC
  Filled 2019-10-15: qty 2

## 2019-10-15 MED ORDER — VANCOMYCIN HCL IN DEXTROSE 1-5 GM/200ML-% IV SOLN
1000.0000 mg | INTRAVENOUS | Status: AC
  Administered 2019-10-15: 1000 mg via INTRAVENOUS

## 2019-10-15 MED ORDER — MIDAZOLAM HCL 5 MG/5ML IJ SOLN
INTRAMUSCULAR | Status: DC | PRN
  Administered 2019-10-15 (×2): 1 mg via INTRAVENOUS

## 2019-10-15 MED ORDER — SODIUM CHLORIDE 0.9 % IV SOLN: 250.0000 mL | INTRAVENOUS | Status: DC

## 2019-10-15 MED ORDER — LIDOCAINE HCL (PF) 1 % IJ SOLN
INTRAMUSCULAR | Status: DC | PRN
  Administered 2019-10-15: 60 mL

## 2019-10-15 MED ORDER — METOPROLOL TARTRATE 50 MG PO TABS: 50.0000 mg | ORAL_TABLET | Freq: Two times a day (BID) | ORAL | 3 refills | Status: DC

## 2019-10-15 MED ORDER — SODIUM CHLORIDE 0.9 % IV SOLN
80.0000 mg | INTRAVENOUS | Status: AC
  Administered 2019-10-15: 80 mg

## 2019-10-15 MED ORDER — HEPARIN (PORCINE) IN NACL 2-0.9 UNITS/ML
INTRAMUSCULAR | Status: AC | PRN
  Administered 2019-10-15: 500 mL

## 2019-10-15 MED ORDER — LIDOCAINE HCL (PF) 1 % IJ SOLN
INTRAMUSCULAR | Status: AC
  Filled 2019-10-15: qty 60

## 2019-10-15 SURGICAL SUPPLY — 7 items
CABLE SURGICAL S-101-97-12 (CABLE) ×2 IMPLANT
LEAD TENDRIL MRI 52CM LPA1200M (Lead) ×1 IMPLANT
LEAD TENDRIL MRI 58CM LPA1200M (Lead) ×1 IMPLANT
PACEMAKER ASSURITY DR-RF (Pacemaker) ×1 IMPLANT
PAD PRO RADIOLUCENT 2001M-C (PAD) ×2 IMPLANT
SHEATH 8FR PRELUDE SNAP 13 (SHEATH) ×2 IMPLANT
TRAY PACEMAKER INSERTION (PACKS) ×2 IMPLANT

## 2019-10-15 NOTE — Op Note (Signed)
Procedure report  Procedure performed:  1. Implantation of new dual chamber permanent pacemaker 2. Fluoroscopy 3. 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  The patient's heart rate, blood pressure, and oxygen saturation are monitored continuously during the procedure. The period of conscious sedation is 47 minutes, of which I was present face-to-face 100% of this time.   Device details: [place] 2272 serial number [id number] Right atrial lead [employer] [id number] serial number [id number] Right ventricular lead [employer] 1200M-58 serial number [id number]  Procedure details:  After the risks and benefits of the procedure were discussed the patient provided informed consent and was brought to the cardiac cath lab in the fasting state. The patient was prepped and draped in usual sterile fashion. Local anesthesia with 1% lidocaine was administered to to the left infraclavicular area. A 5-6 cm horizontal incision was made parallel with and 2-3 cm caudal to the left clavicle. Using electrocautery and blunt dissection a prepectoral pocket was created down to the level of the pectoralis major muscle fascia. The pocket was carefully inspected for hemostasis. An antibiotic-soaked sponge was placed in the pocket.  Under fluoroscopic guidance and using the modified Seldinger technique 2 separate venipunctures were performed to access the left subclavian vein.  No difficulty was encountered accessing the vein.  Two J-tip guidewires were subsequently exchanged for two 41F French safe sheaths.  Under fluoroscopic guidance the ventricular lead was advanced to level of the mid to apical right ventricular septum and thet active-fixation helix was deployed. Prominent current of injury was seen. Satisfactory pacing and sensing parameters were recorded. There was no evidence of diaphragmatic stimulation at maximum device output. The safe sheath was peeled away and the lead was secured in place with 2-0 silk.  In similar fashion the right atrial lead was advanced to the level of the atrial appendage. The active-fixation helix was deployed. There was prominent current of injury. Satisfactory  pacing and sensing parameters were recorded. There was no evidence of diaphragmatic stimulation with pacing at maximum device output. The safe sheath was peeled away and the lead was secured in place with 2-0 silk.  The antibiotic-soaked sponge was removed from the pocket. The pocket was flushed with copious amounts of antibiotic solution. Reinspection showed excellent hemostasis..  The ventricular lead was connected to the generator and appropriate ventricular pacing was seen. Subsequently the atrial lead was also connected. Repeat testing of the lead parameters via telemetry showed excellent values.  The entire system was then carefully inserted in the pocket with care been taking that the leads and device assumed a comfortable position without pressure on the incision. Great care was taken that the leads be located deep to the generator. The pocket was then closed in layers using 2 layers of 2-0 Vicryl, one layer of 3-) Vicryl, after which Steristrips and a sterile dressing was applied.  At the end of the procedure the following lead parameters were encountered:  Right atrial lead  sensed P waves >5 mV, impedance 460 ohms, threshold 1.2 V at 0.5  ms pulse width.  Right  ventricular lead sensed R waves 6.4 mV, impedance 980 ohms, threshold 1.0 V at 0.5 ms pulse width.

## 2019-10-15 NOTE — Interval H&P Note (Signed)
History and Physical Interval Note:  10/15/2019 1:32 PM  Marcus Norton  has presented today for surgery, with the diagnosis of tachibrady.  The various methods of treatment have been discussed with the patient and family. After consideration of risks, benefits and other options for treatment, the patient has consented to  Procedure(s): PACEMAKER IMPLANT (N/A) as a surgical intervention.  The patient's history has been reviewed, patient examined, no change in status, stable for surgery.  I have reviewed the patient's chart and labs.  Questions were answered to the patient's satisfaction.     Travor Royce

## 2019-10-15 NOTE — Discharge Instructions (Signed)

## 2019-10-15 NOTE — Progress Notes (Signed)
Dr Sallyanne Kuster in and ok to d/c home

## 2019-10-16 ENCOUNTER — Encounter (HOSPITAL_COMMUNITY): Payer: Self-pay | Admitting: Cardiovascular Disease

## 2019-10-16 ENCOUNTER — Ambulatory Visit: Payer: Medicare PPO | Admitting: Cardiovascular Disease

## 2019-10-16 MED FILL — Lidocaine HCl Local Inj 1%: INTRAMUSCULAR | Qty: 20 | Status: AC

## 2019-10-30 ENCOUNTER — Other Ambulatory Visit: Payer: Self-pay

## 2019-10-30 ENCOUNTER — Ambulatory Visit: Payer: Medicare PPO | Admitting: Cardiovascular Disease

## 2019-10-30 ENCOUNTER — Encounter: Payer: Self-pay | Admitting: Cardiovascular Disease

## 2019-10-30 VITALS — BP 160/60 | HR 61 | Ht 67.0 in | Wt 179.0 lb

## 2019-10-30 DIAGNOSIS — I10 Essential (primary) hypertension: Secondary | ICD-10-CM | POA: Diagnosis not present

## 2019-10-30 DIAGNOSIS — I739 Peripheral vascular disease, unspecified: Secondary | ICD-10-CM | POA: Diagnosis not present

## 2019-10-30 DIAGNOSIS — I2510 Atherosclerotic heart disease of native coronary artery without angina pectoris: Secondary | ICD-10-CM | POA: Diagnosis not present

## 2019-10-30 DIAGNOSIS — I359 Nonrheumatic aortic valve disorder, unspecified: Secondary | ICD-10-CM

## 2019-10-30 DIAGNOSIS — E785 Hyperlipidemia, unspecified: Secondary | ICD-10-CM

## 2019-10-30 MED ORDER — AMLODIPINE BESYLATE 5 MG PO TABS: 5.0000 mg | ORAL_TABLET | Freq: Every day | ORAL | 1 refills | Status: AC

## 2019-10-30 NOTE — Progress Notes (Signed)
Cardiology Office Note   Date:  10/30/2019   ID:  Marcus Norton, Marcus Norton [birth year], MRN [id number]  PCP:  [name], PA-C  Cardiologist: Dr. Fletcher Anon  No chief complaint on file.     History of Present Illness: Marcus Norton is a 79 y.o. male who presents for a follow-up visit regarding claudication and PAD. He has known history of  paroxysmal supraventricular tachycardia,  coronary artery disease, mild to moderate aortic stenosis, type 2 diabetes since 1990, hypertension and hyperlipidemia. He has known history of claudication . He did not tolerate cilostazol due to palpitations. He is s/p bilateral common iliac artery kissing stent placement in 11/2015. 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   He had abnormal nuclear stress test in 2018.  Cardiac catheterization in June, 2018 showed severe ostial RCA stenosis and occluded RPL2 with nonobstructive disease affecting the left system.  He underwent successful angioplasty and drug-eluting stent placement to the ostial right coronary artery. He also has mild aortic stenosis. He has known history of intolerance to statins and was able to tolerate only small dose of atorvastatin.  He is currently on Repatha.  He was seen recently for episodes of dizziness and presyncope.  An echocardiogram was done which showed normal LV systolic function, mild mitral regurgitation and moderate aortic stenosis with a valve area of 1.22 cm. An outpatient monitor showed frequent episodes of SVT as well as a 3-second pause.  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  He was referred to Dr. Sallyanne Kuster who implanted a dual-chamber pacemaker on August 23 without complications.  The patient reports less dizziness and no presyncopal episodes since then.  He has some mild chest discomfort on the right side but  no exertional symptoms overall.  He does not sleep well at night due to sleep apnea and intolerance to CPAP.   Past Medical History:  Diagnosis Date  . 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 Arthritis    "lower back" (07/29/2016)  . ASCVD (arteriosclerotic cardiovascular disease)    -luminal irregularities in 1998 and 2005; normal EF  . Benign prostatic hypertrophy    status post transurethral resection of the prostate  . CAD (coronary artery disease)    a. Cath 07/29/16 99% ostial RCA & 70% pRCA s/p cutting balloon angioplasty and single SYNERGY DES 2.5X28. 100% very Distal 1st RPLB lesion --> the occlusion site moved further distal with guidewire advancement;  40% ostial LAD; 50% Ost Cx to Prox Cx lesion.  . Coronary artery disease   . Dysphagia   . Erectile dysfunction   . GERD (gastroesophageal reflux disease)   . Heart murmur   . History of hiatal hernia   . History of kidney stones   . Hyperlipidemia   . Hypertension   . Seasonal allergies   . Tobacco abuse    20 pack years; discontinued 1995  . Type II diabetes mellitus ([place])     Past Surgical History:  Procedure Laterality Date  . APPENDECTOMY    . CATARACT EXTRACTION W/ INTRAOCULAR LENS  IMPLANT, BILATERAL Bilateral   . COLONOSCOPY  07/20/2011   Dr. Gala Romney: multiple hyperplastic polyps. Surveillance 2018  . COLONOSCOPY N/A 06/30/2016   Procedure: COLONOSCOPY;  Surgeon: Daneil Dolin, MD;  Location: [facility];  Service: Endoscopy;  Laterality: N/A;  10:00am  . COLONOSCOPY W/  BIOPSIES AND POLYPECTOMY  07/08/08, 06/2011   Dr Madolyn Frieze papilla, diminutive rectal polyp ablated the, left-sided diverticula, piecemeal polypectomy of hepatic flexure adenomatous polyp, diminutive polyps near hepatic flexure ablated  . CORONARY ANGIOPLASTY WITH STENT PLACEMENT  07/29/2016  . CORONARY STENT INTERVENTION N/A 07/29/2016   Procedure: Coronary Stent Intervention;  Surgeon: Leonie Man, MD;  Location: [place] [facility];  Service:  Cardiovascular;  Laterality: N/A;  RCA  . ESOPHAGOGASTRODUODENOSCOPY  2013   erosive reflux esophagitis, s/p empiric dilation. chronic duodenitis.   [name] LEFT HEART CATH AND CORONARY ANGIOGRAPHY N/A 07/29/2016   Procedure: Left Heart Cath and Coronary Angiography;  Surgeon: Leonie Man, MD;  Location: [place] [facility];  Service: Cardiovascular;  Laterality: N/A;  . NASAL SEPTUM SURGERY    . NASAL SINUS SURGERY     "cleaned out my sinus"  . PACEMAKER IMPLANT N/A 10/15/2019   Procedure: PACEMAKER IMPLANT;  Surgeon: Sanda Klein, MD;  Location: [place] [facility];  Service: Cardiovascular;  Laterality: N/A;  . PERIPHERAL VASCULAR CATHETERIZATION N/A 11/26/2015   Procedure: Abdominal Aortogram w/Lower Extremity;  Surgeon: Wellington Hampshire, MD;  Location: [place] [facility];  Service: Cardiovascular;  Laterality: N/A;  . TONSILLECTOMY    . TRANSURETHRAL RESECTION OF PROSTATE  2009     Current Outpatient Medications  Medication Sig Dispense Refill  . alfuzosin (UROXATRAL) 10 MG 24 hr tablet Take 10 mg by mouth daily with breakfast.    . amLODipine (NORVASC) 2.5 MG tablet Take 1 tablet (2.5 mg total) by mouth daily. 90 tablet 1  . aspirin EC 81 MG tablet Take 81 mg by mouth at bedtime.     [name] atorvastatin (LIPITOR) 10 MG tablet Take 1 tablet (10 mg total) by mouth once a week. (Patient taking differently: Take 10 mg by mouth every Wednesday. ) 90 tablet 1  . Cholecalciferol (VITAMIN D3) 50 MCG (2000 UT) TABS Take 2,000 Units by mouth daily.    . fluticasone furoate-vilanterol (BREO ELLIPTA) 100-25 MCG/INH AEPB Inhale 1 puff into the lungs daily. 180 each 2  . LANTUS SOLOSTAR 100 UNIT/ML Solostar Pen Inject 26 Units into the skin daily. (Patient taking differently: Inject 28 Units into the skin at bedtime. ) 15 mL 3  . losartan (COZAAR) 25 MG tablet TAKE 1 TABLET(25 MG) BY MOUTH DAILY (Patient taking differently: Take 25 mg by mouth in the morning. ) 90 tablet 1  . metFORMIN (GLUCOPHAGE) 1000 MG  tablet Take 1 tablet (1,000 mg total) by mouth 2 (two) times daily with a meal. 180 tablet 1  . metoprolol tartrate (LOPRESSOR) 50 MG tablet Take 1 tablet (50 mg total) by mouth 2 (two) times daily. 180 tablet 3  . montelukast (SINGULAIR) 10 MG tablet Take 1 tablet (10 mg total) by mouth at bedtime. 30 tablet 11  . MULTIPLE VITAMIN PO Take 1 tablet by mouth daily.    . Multiple Vitamins-Minerals (HAIR SKIN AND NAILS FORMULA) TABS Take 1 tablet by mouth daily. Gummie    . nitroGLYCERIN (NITROSTAT) 0.4 MG SL tablet Place 1 tablet (0.4 mg total) under the tongue every 5 (five) minutes as needed for chest pain. 5 tablet 0  . Oxymetazoline HCl (NASAL SPRAY NA) Place 1 spray into the nose See admin instructions. Alternate each Nasal at bedtime    . pantoprazole (PROTONIX) 40 MG tablet TAKE 1 TABLET(40 MG) BY MOUTH DAILY (Patient taking differently: Take 40 mg by mouth in the morning. ) 90 tablet 1  . REPATHA  SURECLICK 010 MG/ML SOAJ ADMINISTER 1 ML UNDER THE SKIN EVERY 14 DAYS 2 mL 12  . sitaGLIPtin (JANUVIA) 100 MG tablet Take 1 tablet (100 mg total) by mouth daily. (Patient taking differently: Take 100 mg by mouth in the morning. ) 90 tablet 1  . tadalafil (CIALIS) 20 MG tablet Take 0.5-1 tablets (10-20 mg total) by mouth every other day as needed for erectile dysfunction. 5 tablet 1  . temazepam (RESTORIL) 15 MG capsule Take 1 capsule (15 mg total) by mouth at bedtime as needed for sleep. 30 capsule 1  . vitamin C (ASCORBIC ACID) 250 MG tablet Take 500 mg by mouth daily. Gummie    . ACCU-CHEK AVIVA PLUS test strip      Current Facility-Administered Medications  Medication Dose Route Frequency Provider Last Rate Last Admin  . sodium chloride flush (NS) 0.9 % injection 3 mL  3 mL Intravenous Q12H Croitoru, Mihai, MD        Allergies:   Plavix [clopidogrel bisulfate], Oxycontin [oxycodone hcl], Penicillins, Pioglitazone, Prednisone, and Simvastatin    Social History:  The patient  reports that he  quit smoking about 26 years ago. His smoking use included cigarettes. He started smoking about 54 years ago. He has a 15.00 pack-year smoking history. He has never used smokeless tobacco. He reports that he does not drink alcohol and does not use drugs.   Family History:  The patient's family history includes Colon cancer (age of onset: 43) in his sister; Ovarian cancer in his mother.    ROS:  Please see the history of present illness.   Otherwise, review of systems are positive for none.   All other systems are reviewed and negative.    PHYSICAL EXAM: VS:  BP (!) 160/60   Pulse 61   Ht 5\' 7"  (1.702 m)   Wt 179 lb (81.2 kg)   SpO2 95%   BMI 28.04 kg/m  , BMI Body mass index is 28.04 kg/m. GEN: Well nourished, well developed, in no acute distress  HEENT: normal  Neck: no JVD, carotid bruits, or masses Cardiac: RRR; no  rubs, or gallops,no edema . 2/6 crescendo decrescendo systolic murmur in the aortic area which is mid peaking Respiratory:  clear to auscultation bilaterally, normal work of breathing GI: soft, nontender, nondistended, + BS MS: no deformity or atrophy  Skin: warm and dry, no rash Neuro:  Strength and sensation are intact Psych: euthymic mood, full affect Vascular: Femoral pulse: Normal bilaterally.  Distal pulses are not palpable  EKG:  EKG ordered today. EKG showed atrial paced rhythm with incomplete right bundle branch block and minimal LVH.  Recent Labs: 03/14/2019: ALT 23 08/07/2019: Platelets 247.0; TSH 3.17 10/01/2019: BUN 20; Creatinine, Ser 0.88; Hemoglobin 13.6; Potassium 5.4; Sodium 140    Lipid Panel    Component Value Date/Time   CHOL 71 08/07/2019 1108   CHOL 75 (L) 02/06/2019 1052   TRIG 70.0 08/07/2019 1108   TRIG 96 03/12/2008 0000   HDL 35.30 (L) 08/07/2019 1108   HDL 37 (L) 02/06/2019 1052   CHOLHDL 2 08/07/2019 1108   VLDL 14.0 08/07/2019 1108   LDLCALC 22 08/07/2019 1108   LDLCALC 24 02/06/2019 1052   LDLCALC 91 03/12/2008 0000       Wt Readings from Last 3 Encounters:  10/30/19 179 lb (81.2 kg)  10/15/19 175 lb (79.4 kg)  10/01/19 180 lb 3.2 oz (81.7 kg)       No flowsheet data found.    ASSESSMENT  AND PLAN:  1.  Peripheral arterial disease: No significant claudication.  Most recent vascular studies showed patent iliac stents and stable ABI.  Repeat studies in 1 year.    2. Coronary artery disease involving native coronary arteries without angina: Continue aggressive medical therapy.    3. Hyperlipidemia: He is currently on atorvastatin and Repatha with most recent LDL of 24.  4. Essential hypertension: His blood pressure is elevated today.  I elected to increase amlodipine back to 5 mg once daily.  5.  Moderate aortic stenosis: Recommend repeat echocardiogram in July 2022.  6.  Tachycardia-bradycardia syndrome, status post dual-chamber pacemaker placement with improvement in symptoms.  We do have the option of increasing metoprolol if needed in the near future now that he has a pacemaker.  7.  Moderate bilateral carotid disease: This was recently found on CT.  We will plan on carotid Doppler in June 2022.    Disposition:   FU with me in 6 months  Signed,  [name], MD  10/30/2019 10:50 AM    [place]

## 2019-10-30 NOTE — Progress Notes (Signed)
Dual chamber pacemaker implantation follow-up. Site healthy without swelling or redness. Device check with good lead parameters. No episodes of atrial mode switch or high ventricular rates. 34% A paced, no V pacing. F/U 3 months for settings adjustment.

## 2019-10-30 NOTE — Patient Instructions (Signed)
Medication Instructions:  Increase Amlodipine 5 mg daily   *If you need a refill on your cardiac medications before your next appointment, please call your pharmacy*  Follow-Up: At [facility], you and your health needs are our priority.  As part of our continuing mission to provide you with exceptional heart care, we have created designated Provider Care Teams.  These Care Teams include your primary Cardiologist (physician) and Advanced Practice Providers (APPs -  Physician Assistants and Nurse Practitioners) who all work together to provide you with the care you need, when you need it.  We recommend signing up for the patient portal called "MyChart".  Sign up information is provided on this After Visit Summary.  MyChart is used to connect with patients for Virtual Visits (Telemedicine).  Patients are able to view lab/test results, encounter notes, upcoming appointments, etc.  Non-urgent messages can be sent to your provider as well.   To learn more about what you can do with MyChart, go to [email or web address].    Your next appointment:   6 month(s)  The format for your next appointment:   In Person  Provider:   [name], MD

## 2019-11-01 IMAGING — DX DG CHEST 2V
2 series · 2 of 2 positions shown · non-contrast
Comparison: February 04, 2017

CLINICAL DATA: Cough and congestion.

EXAM:
CHEST - 2 VIEW

[chest pa]
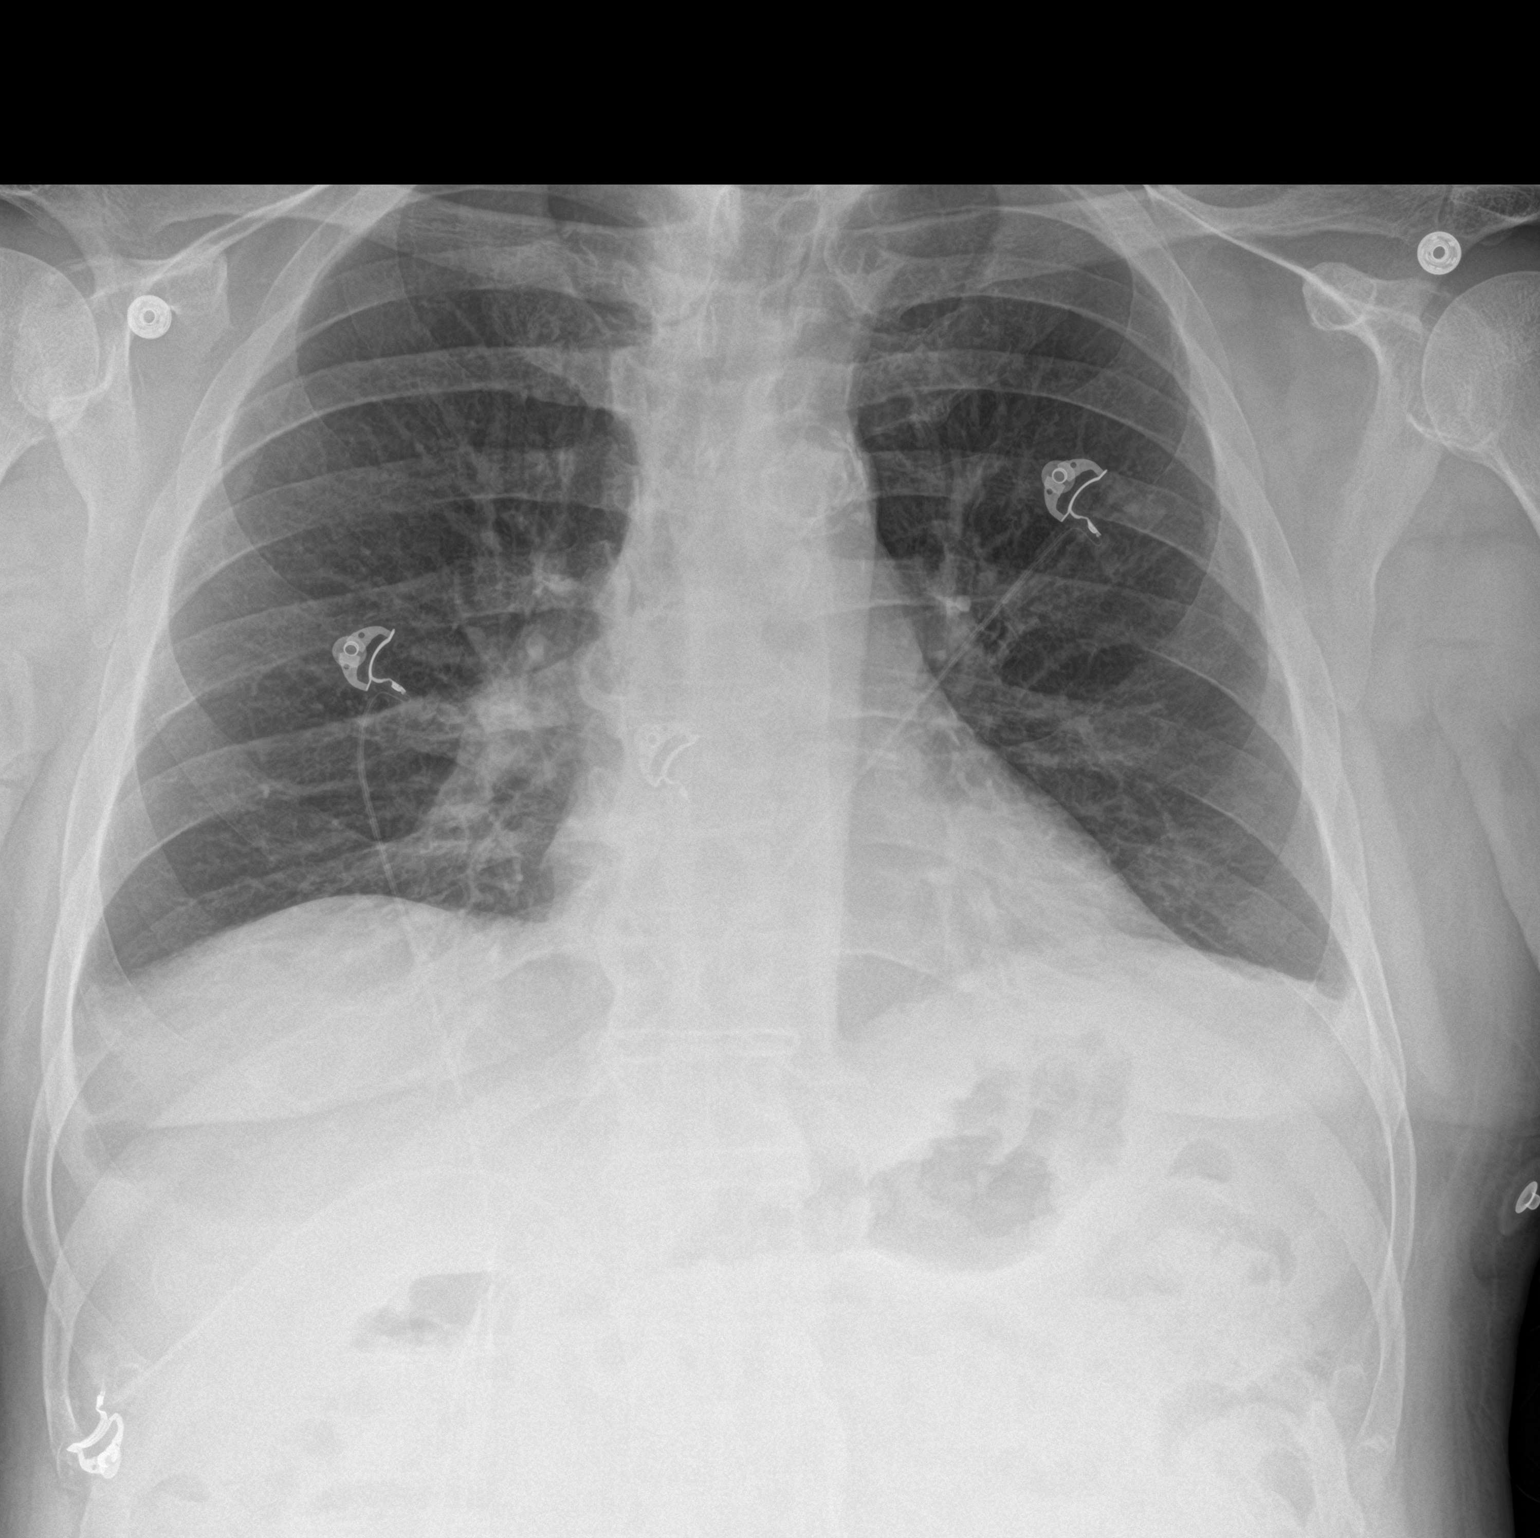

[chest lat]
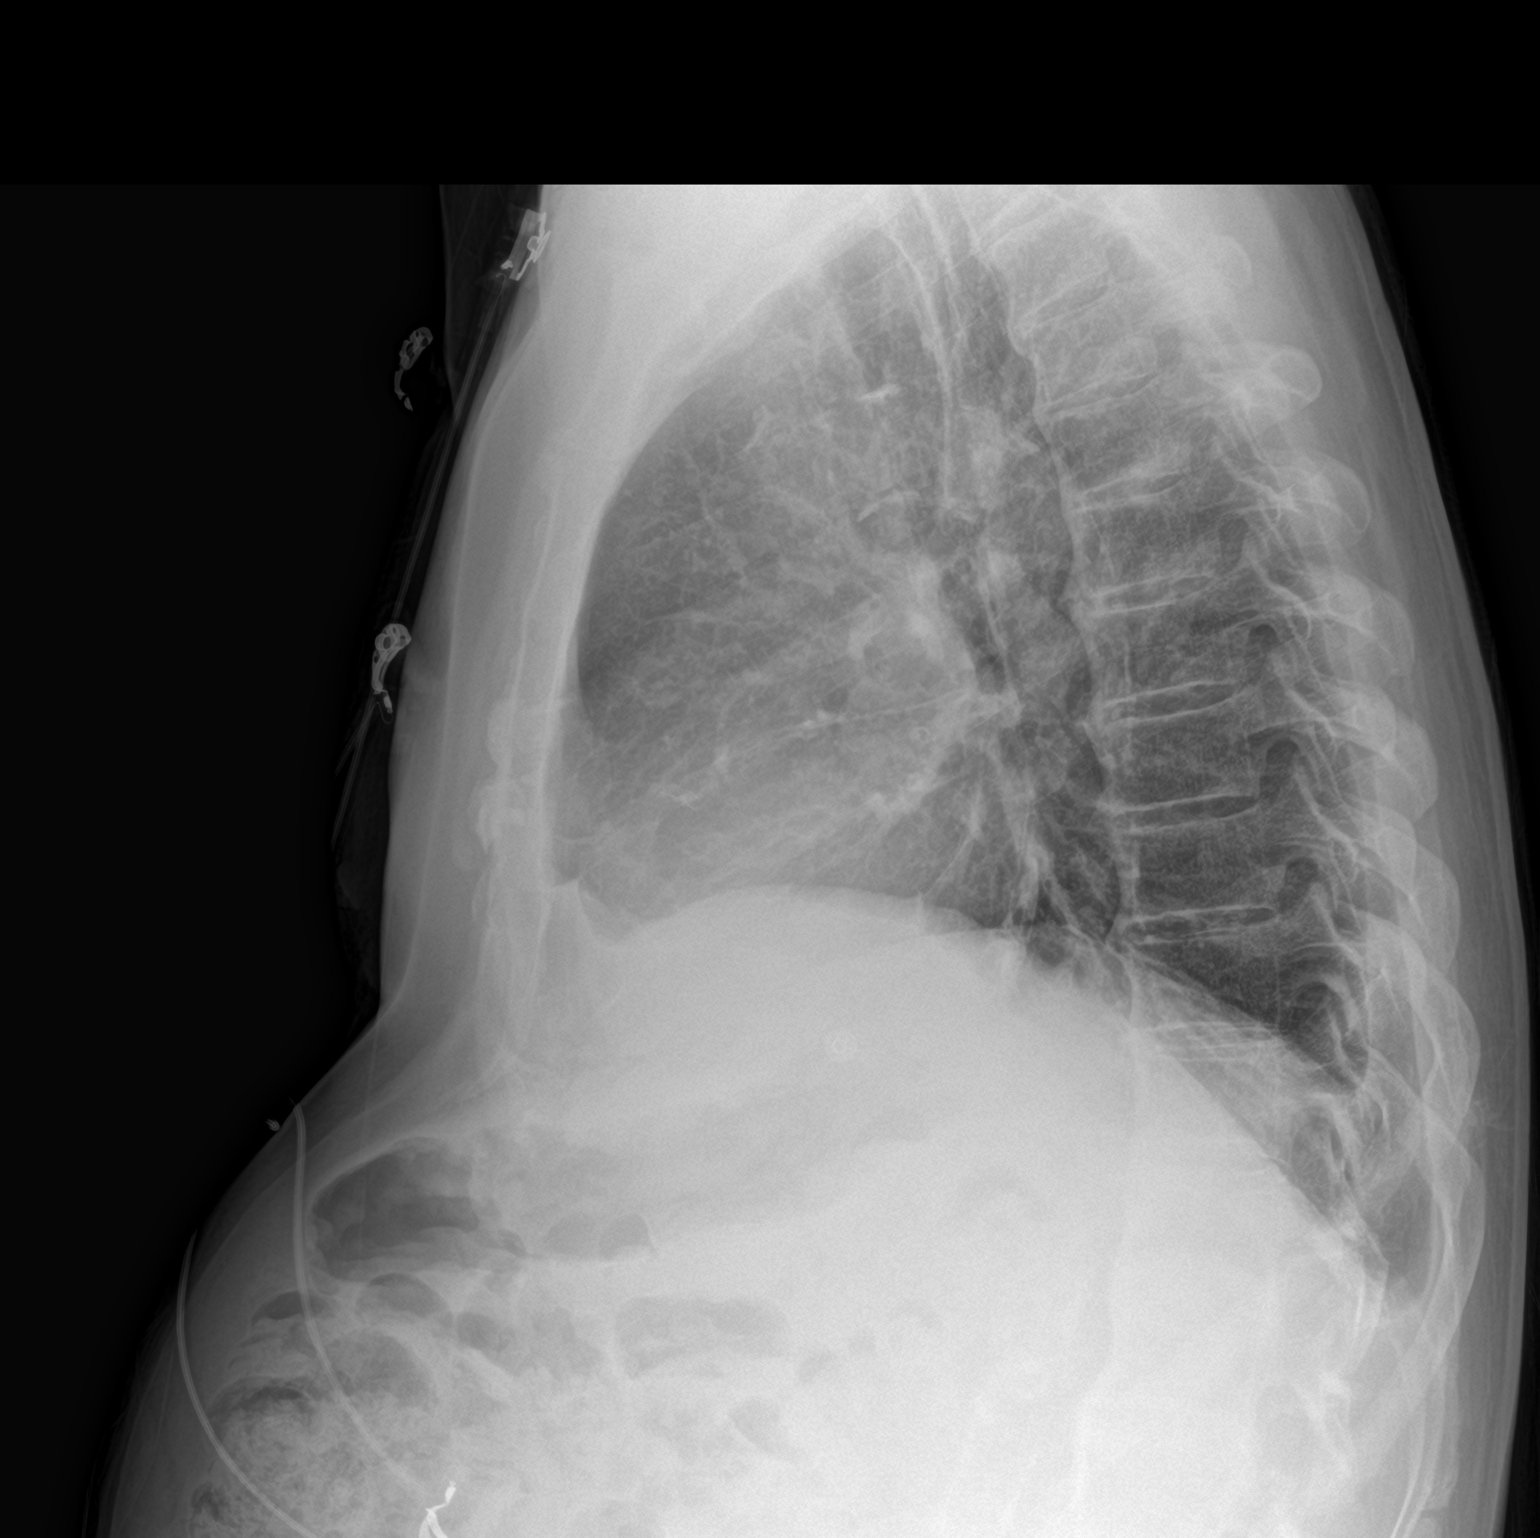

[2 of 2 positions shown; findings below may reference images not displayed]

FINDINGS: Suggested tiny effusions. Minimal bibasilar atelectasis. The heart,
hila, mediastinum, lungs, and pleura are otherwise unremarkable.
IMPRESSION: Tiny effusions and atelectasis.  No other acute abnormalities.

## 2019-11-01 NOTE — Addendum Note (Signed)
Addended by: Zebedee Iba on: 11/01/2019 02:29 PM   Modules accepted: Orders

## 2019-11-06 ENCOUNTER — Telehealth: Payer: Self-pay | Admitting: Cardiovascular Disease

## 2019-11-06 NOTE — Telephone Encounter (Signed)
Called and spoke to patient's daughter (patient gave permission to speak with her). She states that the patient had an episode of dizziness earlier and that his legs have felt weak. Patient denies  Chest pain, syncope, or any other Sx. Patient's BP has been 133/58 HR 62. She states that he had been outside and has not eaten well today. Instructed for patient to stay well hydrated and eat regular meals. Denies having any Sx at this time. Instructed to let us know if his Sx return.

## 2019-11-06 NOTE — Telephone Encounter (Signed)
STAT if patient feels like he/she is going to faint   1) Are you dizzy now? Per daughter, patient states, "maybe a little"  2) Do you feel faint or have you passed out? Per daughter, she thinks he would say yes but she is not sure.  3) Do you have any other symptoms? lightheaded  4) Have you checked your HR and BP (record if available)? BP 133/58, she states that this is fluctuating.

## 2019-11-15 ENCOUNTER — Encounter (INDEPENDENT_AMBULATORY_CARE_PROVIDER_SITE_OTHER): Payer: Medicare PPO | Admitting: Ophthalmology

## 2019-11-19 ENCOUNTER — Ambulatory Visit (INDEPENDENT_AMBULATORY_CARE_PROVIDER_SITE_OTHER): Payer: Medicare PPO | Admitting: Ophthalmology

## 2019-11-19 ENCOUNTER — Other Ambulatory Visit: Payer: Self-pay

## 2019-11-19 ENCOUNTER — Encounter (INDEPENDENT_AMBULATORY_CARE_PROVIDER_SITE_OTHER): Payer: Self-pay | Admitting: Ophthalmology

## 2019-11-19 DIAGNOSIS — E113313 Type 2 diabetes mellitus with moderate nonproliferative diabetic retinopathy with macular edema, bilateral: Secondary | ICD-10-CM | POA: Diagnosis not present

## 2019-11-19 DIAGNOSIS — E113311 Type 2 diabetes mellitus with moderate nonproliferative diabetic retinopathy with macular edema, right eye: Secondary | ICD-10-CM

## 2019-11-19 DIAGNOSIS — H43823 Vitreomacular adhesion, bilateral: Secondary | ICD-10-CM

## 2019-11-19 DIAGNOSIS — E113312 Type 2 diabetes mellitus with moderate nonproliferative diabetic retinopathy with macular edema, left eye: Secondary | ICD-10-CM

## 2019-11-19 NOTE — Assessment & Plan Note (Signed)
This condition present in the past, now currently without macular edema, observe

## 2019-11-19 NOTE — Assessment & Plan Note (Signed)
Similarly, this condition resolved status post focal laser

## 2019-11-19 NOTE — Patient Instructions (Signed)
Patient asked to report any new onset visual acuity declines or distortions Diabetic Retinopathy Diabetic retinopathy is a disease of the retina. The retina is a light-sensitive membrane at the back of the eye. Retinopathy is a complication of diabetes (diabetes mellitus) and a common cause of bad eyesight (visual impairment). It can eventually cause blindness. Early detection and treatment of diabetic retinopathy is important in keeping your eyes healthy and preventing further damage to them. What are the causes? Diabetic retinopathy is caused by blood sugar (glucose) levels that are too high for an extended period of time. High blood glucose over an extended period of time can:  Damage small blood vessels in the retina, allowing blood to leak through the vessel walls.  Cause new, abnormal blood vessels to grow on the retina. This can scar the retina in the advanced stage of diabetic retinopathy. What increases the risk? You are more likely to develop this condition if:  You have had diabetes for a long time.  You have poorly controlled blood glucose.  You have high blood pressure. What are the signs or symptoms? In the early stages of diabetic retinopathy, there are often no symptoms. As the condition gets worse, symptoms may include:  Blurred vision. This is usually caused by swelling due to abnormal blood glucose levels. The blurriness may go away when blood glucose levels return to normal.  Moving specks or dark spots (floaters) in your vision. These can be caused by a small amount of bleeding (hemorrhage) from retinal blood vessels.  Missing parts of your field of vision, such as vision at the sides of the eyes. This can be caused by larger retinal hemorrhages.  Difficulty reading.  Double vision.  Pain in one or both eyes.  Feeling pressure in one or both eyes.  Trouble seeing straight lines. Straight lines may not look straight.  Redness of the eyes that does not go  away. How is this diagnosed? This condition may be diagnosed with an eye exam in which your eye care specialist puts drops in your eyes that enlarge (dilate) your pupils. This lets your health care provider examine your retina and check for changes in your retinal blood vessels. How is this treated? This condition may be treated by:  Keeping your blood glucose and blood pressure within a target range.  Using a type of laser beam to seal your retinal blood vessels. This stops them from bleeding and decreases pressure in your eye.  Getting shots of medicine in the eye to reduce swelling of the center of the retina (macula). You may be given: ? Anti-VEGF medicine. This medicine can help slow vision loss, and may even improve vision. ? Steroid medicine. Follow these instructions at home:   Follow your diabetes management plan as directed by your health care provider. This may include exercising regularly and eating a healthy diet.  Keep your blood glucose level and your blood pressure in your target range, as directed by your health care provider.  Check your blood glucose as often as directed.  Take over the counter and prescription medicines only as told by your health care provider. This includes insulin and oral diabetes medicine.  Get your eyes checked at least once every year. An eye specialist can usually see diabetic retinopathy developing long before it starts to cause problems. In many cases, it can be treated to prevent complications from occurring.  Do not use any products that contain nicotine or tobacco, such as cigarettes and e-cigarettes. If you  need help quitting, ask your health care provider.  Keep all follow-up visits as told by your health care provider. This is important. Contact a health care provider if:  You notice gradual blurring or other changes in your vision over time.  You notice that your glasses or contact lenses do not make things look as sharp as they  once did.  You have trouble reading or seeing details at a distance with either eye.  You notice a change in your vision or notice that parts of your field of vision appear missing or hazy.  You suddenly see moving specks or dark spots in the field of vision of either eye. Get help right away if:  You have sudden pain or pressure in one or both eyes.  You suddenly lose vision or a curtain or veil seems to come across your eyes.  You have a sudden burst of floaters in your vision. Summary  Diabetic retinopathy is a disease of the retina. The retina is a light-sensitive membrane at the back of the eye. Retinopathy is a complication of diabetes.  Get your eyes checked at least once every year. An eye specialist can usually see diabetic retinopathy developing long before it starts to cause problems. In many cases, it can be treated to prevent complications from occurring.  Keep your blood glucose and your blood pressure in target range. Follow your diabetes management plan as directed by your health care provider.  Protect your eyes. Wear sunglasses and eye protection when needed. This information is not intended to replace advice given to you by your health care provider. Make sure you discuss any questions you have with your health care provider. Document Revised: 03/23/2017 Document Reviewed: 03/15/2016 Elsevier Patient Education  2020 [employer].

## 2019-11-19 NOTE — Progress Notes (Signed)
11/19/2019     CHIEF COMPLAINT Patient presents for Retina Follow Up   HISTORY OF PRESENT ILLNESS: Marcus Norton is a 79 y.o. male who presents to the clinic today for:   HPI    Retina Follow Up    Patient presents with  Diabetic Retinopathy.  In both eyes.  This started 5 months ago.  Severity is mild.  Duration of 5 months.  Since onset it is stable.          Comments    5 Month Diabetic F/U OU  Pt denies noticeable changes to [place] OU since last visit. Pt denies ocular pain, flashes of light, or floaters OU.  A1c: 8.0, 06/2019 LBS: 141 yesterday AM       Last edited by Rockie Neighbours, [place] on 11/19/2019  9:04 AM. (History)      Referring physician: [name], PA-C [id number] [place],  [place] [address]  HISTORICAL INFORMATION:   Selected notes from the MEDICAL RECORD NUMBER    Lab Results  Component Value Date   HGBA1C 8.7 (H) 08/07/2019     CURRENT MEDICATIONS: No current outpatient medications on file. (Ophthalmic Drugs)   No current facility-administered medications for this visit. (Ophthalmic Drugs)   Current Outpatient Medications (Other)  Medication Sig  . ACCU-CHEK AVIVA PLUS test strip   . alfuzosin (UROXATRAL) 10 MG 24 hr tablet Take 10 mg by mouth daily with breakfast.  . amLODipine (NORVASC) 5 MG tablet Take 1 tablet (5 mg total) by mouth daily.  [name] aspirin EC 81 MG tablet Take 81 mg by mouth at bedtime.   [name] atorvastatin (LIPITOR) 10 MG tablet Take 1 tablet (10 mg total) by mouth once a week. (Patient taking differently: Take 10 mg by mouth every Wednesday. )  . Cholecalciferol (VITAMIN D3) 50 MCG (2000 UT) TABS Take 2,000 Units by mouth daily.  . fluticasone furoate-vilanterol (BREO ELLIPTA) 100-25 MCG/INH AEPB Inhale 1 puff into the lungs daily.  [name] LANTUS SOLOSTAR 100 UNIT/ML Solostar Pen Inject 26 Units into the skin daily. (Patient taking differently: Inject 28 Units into the skin at bedtime. )  . losartan (COZAAR) 25 MG tablet  TAKE 1 TABLET(25 MG) BY MOUTH DAILY (Patient taking differently: Take 25 mg by mouth in the morning. )  . metFORMIN (GLUCOPHAGE) 1000 MG tablet Take 1 tablet (1,000 mg total) by mouth 2 (two) times daily with a meal.  . metoprolol tartrate (LOPRESSOR) 50 MG tablet Take 1 tablet (50 mg total) by mouth 2 (two) times daily.  . montelukast (SINGULAIR) 10 MG tablet Take 1 tablet (10 mg total) by mouth at bedtime.  . MULTIPLE VITAMIN PO Take 1 tablet by mouth daily.  . Multiple Vitamins-Minerals (HAIR SKIN AND NAILS FORMULA) TABS Take 1 tablet by mouth daily. Gummie  . nitroGLYCERIN (NITROSTAT) 0.4 MG SL tablet Place 1 tablet (0.4 mg total) under the tongue every 5 (five) minutes as needed for chest pain.  [name] Oxymetazoline HCl (NASAL SPRAY NA) Place 1 spray into the nose See admin instructions. Alternate each Nasal at bedtime  . pantoprazole (PROTONIX) 40 MG tablet TAKE 1 TABLET(40 MG) BY MOUTH DAILY (Patient taking differently: Take 40 mg by mouth in the morning. )  . REPATHA SURECLICK 809 MG/ML SOAJ ADMINISTER 1 ML UNDER THE SKIN EVERY 14 DAYS  . sitaGLIPtin (JANUVIA) 100 MG tablet Take 1 tablet (100 mg total) by mouth daily. (Patient taking differently: Take 100 mg by mouth in the morning. )  .  tadalafil (CIALIS) 20 MG tablet Take 0.5-1 tablets (10-20 mg total) by mouth every other day as needed for erectile dysfunction.  . temazepam (RESTORIL) 15 MG capsule Take 1 capsule (15 mg total) by mouth at bedtime as needed for sleep.  . vitamin C (ASCORBIC ACID) 250 MG tablet Take 500 mg by mouth daily. Gummie   Current Facility-Administered Medications (Other)  Medication Route  . sodium chloride flush (NS) 0.9 % injection 3 mL Intravenous      REVIEW OF SYSTEMS:    ALLERGIES Allergies  Allergen Reactions  . Plavix [Clopidogrel Bisulfate] Shortness Of Breath  . Oxycontin [Oxycodone Hcl] Other (See Comments)    Malaise  . Penicillins Other (See Comments)    [place] M[place]   . Pioglitazone Other  (See Comments)    Made his chest hurt  . Prednisone Other (See Comments)    Reaction: muscle aches and soreness  . Simvastatin Other (See Comments)    Myalgias    PAST MEDICAL HISTORY Past Medical History:  Diagnosis Date  . 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 Arthritis    "lower back" (07/29/2016)  . ASCVD (arteriosclerotic cardiovascular disease)    -luminal irregularities in 1998 and 2005; normal EF  . Benign prostatic hypertrophy    status post transurethral resection of the prostate  . CAD (coronary artery disease)    a. Cath 07/29/16 99% ostial RCA & 70% pRCA s/p cutting balloon angioplasty and single SYNERGY DES 2.5X28. 100% very Distal 1st RPLB lesion --> the occlusion site moved further distal with guidewire advancement;  40% ostial LAD; 50% Ost Cx to Prox Cx lesion.  . Coronary artery disease   . Dysphagia   . Erectile dysfunction   . GERD (gastroesophageal reflux disease)   . Heart murmur   . History of hiatal hernia   . History of kidney stones   . Hyperlipidemia   . Hypertension   . Seasonal allergies   . Tobacco abuse    20 pack years; discontinued 1995  . Type II diabetes mellitus ([place])    Past Surgical History:  Procedure Laterality Date  . APPENDECTOMY    . CATARACT EXTRACTION W/ INTRAOCULAR LENS  IMPLANT, BILATERAL Bilateral   . COLONOSCOPY  07/20/2011   Dr. Gala Romney: multiple hyperplastic polyps. Surveillance 2018  . COLONOSCOPY N/A 06/30/2016   Procedure: COLONOSCOPY;  Surgeon: Daneil Dolin, MD;  Location: [facility];  Service: Endoscopy;  Laterality: N/A;  10:00am  . COLONOSCOPY W/ BIOPSIES AND POLYPECTOMY  07/08/08, 06/2011   Dr Madolyn Frieze papilla, diminutive rectal polyp ablated the, left-sided diverticula, piecemeal polypectomy of hepatic flexure adenomatous polyp, diminutive polyps near hepatic flexure ablated  . CORONARY ANGIOPLASTY WITH STENT PLACEMENT  07/29/2016  . CORONARY STENT INTERVENTION N/A 07/29/2016   Procedure: Coronary Stent Intervention;   Surgeon: Leonie Man, MD;  Location: [facility];  Service: Cardiovascular;  Laterality: N/A;  RCA  . ESOPHAGOGASTRODUODENOSCOPY  2013   erosive reflux esophagitis, s/p empiric dilation. chronic duodenitis.   [name] LEFT HEART CATH AND CORONARY ANGIOGRAPHY N/A 07/29/2016   Procedure: Left Heart Cath and Coronary Angiography;  Surgeon: Leonie Man, MD;  Location: [facility];  Service: Cardiovascular;  Laterality: N/A;  . NASAL SEPTUM SURGERY    . NASAL SINUS SURGERY     "cleaned out my sinus"  . PACEMAKER IMPLANT N/A 10/15/2019   Procedure: PACEMAKER IMPLANT;  Surgeon: Sanda Klein, MD;  Location: [facility];  Service: Cardiovascular;  Laterality: N/A;  .  PERIPHERAL VASCULAR CATHETERIZATION N/A 11/26/2015   Procedure: Abdominal Aortogram w/Lower Extremity;  Surgeon: Wellington Hampshire, MD;  Location: [facility];  Service: Cardiovascular;  Laterality: N/A;  . TONSILLECTOMY    . TRANSURETHRAL RESECTION OF PROSTATE  2009    FAMILY HISTORY Family History  Problem Relation Age of Onset  . Ovarian cancer Mother   . Colon cancer Sister 48       deceased from colon cancer    SOCIAL HISTORY Social History   Tobacco Use  . Smoking status: Former Smoker    Packs/day: 1.00    Years: 15.00    Pack years: 15.00    Types: Cigarettes    Start date: 03/08/1965    Quit date: 07/07/1993    Years since quitting: 26.3  . Smokeless tobacco: Never Used  Vaping Use  . Vaping Use: Never used  Substance Use Topics  . Alcohol use: No    Alcohol/week: 0.0 standard drinks    Comment: 07/29/2016 "last drink was 4 wks ago; had a beer"  . Drug use: No         OPHTHALMIC EXAM:  Base Eye Exam    Visual Acuity (ETDRS)      Right Left   Dist cc 20/20 20/20   Correction: Glasses       Tonometry (Tonopen, 9:04 AM)      Right Left   Pressure 08 06       Pupils      Pupils Dark Light Shape React APD   Right PERRL 5 3 Round Brisk None   Left PERRL 5 3 Round Brisk  None       Visual Fields (Counting fingers)      Left Right    Full Full       Extraocular Movement      Right Left    Full Full       Neuro/Psych    Oriented x3: Yes   Mood/Affect: Normal       Dilation    Both eyes: 1.0% Mydriacyl, 2.5% Phenylephrine @ 9:07 AM        Slit Lamp and Fundus Exam    External Exam      Right Left   External Normal Normal       Slit Lamp Exam      Right Left   Lids/Lashes Normal Normal   Conjunctiva/Sclera White and quiet White and quiet   Cornea Clear Clear   Anterior Chamber Deep and quiet Deep and quiet   Iris Round and reactive Round and reactive   Lens Centered posterior chamber intraocular lens Centered posterior chamber intraocular lens   Anterior Vitreous Normal Normal       Fundus Exam      Right Left   Posterior Vitreous Normal Normal   Disc Normal Normal   C/D Ratio 0.3 0.35   Macula Microaneurysms, no macular thickening Microaneurysms, no macular thickening   Vessels NPDR- Mild NPDR- Mild   Periphery Normal Normal          IMAGING AND PROCEDURES  Imaging and Procedures for 11/19/19  OCT, Retina - OU - Both Eyes       Right Eye Quality was good. Scan locations included subfoveal. Central Foveal Thickness: 270. Progression has been stable. Findings include vitreomacular adhesion .   Left Eye Quality was good. Scan locations included subfoveal. Central Foveal Thickness: 272. Progression has been stable. Findings include vitreomacular adhesion .   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 Vitreomacular adhesion of both eyes  H43.823     1.  CSME OU has resolved will observe  2.  Incidental vitreal macular adhesion, not pathologic  3.  Ophthalmic Meds Ordered this visit:  No orders of the defined types were placed in this encounter.      Return in about 8 months (around 07/18/2020) for DILATE OU, OCT.  Patient Instructions  Patient asked to report any new onset visual acuity declines or distortions Diabetic Retinopathy Diabetic retinopathy is a disease of the retina. The retina is a light-sensitive membrane at the back of the eye. Retinopathy is a complication of diabetes (diabetes mellitus) and a common cause of bad eyesight (visual impairment). It can eventually cause blindness. Early detection and treatment of diabetic retinopathy is important in keeping your eyes healthy and preventing further damage to them. What are the causes? Diabetic retinopathy is caused by blood sugar (glucose) levels that are too high for an extended period of time. High blood glucose over an extended period of time can:  Damage small blood vessels in the retina, allowing blood to leak through the vessel walls.  Cause new, abnormal blood vessels to grow on the retina. This can scar the retina in the advanced stage of diabetic retinopathy. What increases the risk? You are more likely to develop this condition if:  You have had diabetes for a long time.  You have poorly controlled blood glucose.  You have high blood pressure. What are the signs or symptoms? In the early stages of diabetic  retinopathy, there are often no symptoms. As the condition gets worse, symptoms may include:  Blurred vision. This is usually caused by swelling due to abnormal blood glucose levels. The blurriness may go away when blood glucose levels return to normal.  Moving specks or dark spots (floaters) in your vision. These can be caused by a small amount of bleeding (hemorrhage) from retinal blood vessels.  Missing parts of your field of vision, such as vision at the sides of the eyes. This can be caused by larger retinal hemorrhages.  Difficulty reading.  Double vision.  Pain in one or both eyes.  Feeling pressure in one or both eyes.  Trouble seeing straight lines. Straight lines may not look straight.  Redness of the eyes that does not go away. How is this diagnosed? This condition may be diagnosed with an eye exam in which your eye care specialist puts drops in your eyes that enlarge (dilate) your pupils. This lets your health care provider examine your retina and check for changes in your retinal blood vessels. How is this treated? This condition may be treated by:  Keeping your blood glucose and blood pressure within a target range.  Using a type of laser beam to seal your retinal blood vessels. This stops them from bleeding and decreases pressure in your eye.  Getting shots of medicine in the eye to reduce swelling of the center of the retina (macula). You may be given: ? Anti-VEGF medicine. This medicine can help slow vision loss, and may even improve vision. ? Steroid medicine. Follow these instructions at home:   Follow your diabetes management plan as directed by your health care provider. This may include exercising regularly and eating a healthy diet.  Keep your blood glucose level and your blood pressure in your target range, as directed by your health care provider.  Check your blood glucose as often as directed.  Take over the counter and prescription medicines only as  told by your health care provider. This includes insulin and oral diabetes medicine.  Get your eyes checked at least once every year. An eye specialist can usually see diabetic retinopathy developing long before it starts to cause problems. In many cases, it can be treated to prevent complications from occurring.  Do not use any products that contain nicotine or tobacco, such as cigarettes and e-cigarettes. If you need help quitting, ask your health care provider.  Keep all follow-up visits as told by your health care provider. This is important. Contact a health care provider if:  You notice gradual blurring or other changes in your vision over time.  You notice that your glasses or contact lenses do not make things look as sharp as they once did.  You have trouble reading or seeing details at a distance with either eye.  You notice a change in your vision or notice that parts of your field of vision appear missing or hazy.  You suddenly see moving specks or dark spots in the field of vision of either eye. Get help right away if:  You have sudden pain or pressure in one or both eyes.  You suddenly lose vision or a curtain or veil seems to come across your eyes.  You have a sudden burst of floaters in your vision. Summary  Diabetic retinopathy is a disease of the retina. The retina is a light-sensitive membrane at the back of the eye. Retinopathy is a complication of diabetes.  Get your eyes checked at least once every year. An eye specialist can usually see diabetic retinopathy developing long before it starts to cause problems. In many cases, it can be treated to prevent complications from occurring.  Keep your blood glucose and your blood pressure in target range. Follow your diabetes management plan as directed by your health care provider.  Protect your eyes. Wear sunglasses and eye protection when needed. This information is not intended to replace advice given to you by your  health care provider. Make sure you discuss any questions you have with your health care provider. Document Revised: 03/23/2017 Document Reviewed: 03/15/2016 Elsevier Patient Education  2020 [employer].     Explained the diagnoses, plan, and follow up with the patient and they expressed understanding.  Patient expressed understanding of the importance of proper follow up care.   Clent Demark Rodney Wigger M.D. Diseases & Surgery of the Retina and Vitreous [facility]ina [facility] Diabetic [place] 11/19/19     Abbreviations: M myopia (nearsighted); A astigmatism; H hyperopia (farsighted); P presbyopia; Mrx spectacle prescription;  CTL contact lenses; OD right eye; OS left eye; OU both eyes  XT exotropia;  ET esotropia; PEK punctate epithelial keratitis; PEE punctate epithelial erosions; DES dry eye syndrome; MGD meibomian gland dysfunction; ATs artificial tears; PFAT's preservative free artificial tears; [place] nuclear sclerotic cataract; PSC posterior subcapsular cataract; ERM epi-retinal membrane; PVD posterior vitreous detachment; RD retinal detachment; DM diabetes mellitus; DR diabetic retinopathy; NPDR non-proliferative diabetic retinopathy; PDR proliferative diabetic retinopathy; CSME clinically significant macular edema; DME diabetic macular edema; dbh dot blot hemorrhages; CWS cotton wool spot; POAG primary open angle glaucoma; C/D cup-to-disc ratio; HVF humphrey visual field; GVF goldmann visual field; OCT optical coherence tomography; IOP intraocular pressure; BRVO Branch retinal vein occlusion; CRVO central retinal vein occlusion; CRAO central retinal artery occlusion; BRAO branch retinal artery occlusion; RT retinal tear; SB scleral buckle; PPV pars plana vitrectomy; VH Vitreous hemorrhage; PRP panretinal laser photocoagulation; IVK intravitreal kenalog; VMT vitreomacular traction; MH Macular hole;  NVD neovascularization of the disc; NVE neovascularization elsewhere; AREDS age related eye disease study; ARMD age  related macular degeneration; POAG primary open angle glaucoma; EBMD epithelial/anterior basement membrane dystrophy; ACIOL anterior chamber intraocular lens; IOL intraocular lens; PCIOL posterior chamber intraocular lens; Phaco/IOL phacoemulsification with intraocular lens placement; [place] photorefractive keratectomy; LASIK laser assisted in situ keratomileusis; HTN hypertension; DM diabetes mellitus; COPD chronic obstructive pulmonary disease

## 2019-11-19 NOTE — Assessment & Plan Note (Signed)
Observe OU

## 2019-11-30 ENCOUNTER — Ambulatory Visit: Payer: Medicare PPO | Admitting: Physician Assistant

## 2019-12-04 ENCOUNTER — Ambulatory Visit: Payer: Medicare PPO | Admitting: Physician Assistant

## 2019-12-05 ENCOUNTER — Encounter: Payer: Self-pay | Admitting: Physician Assistant

## 2019-12-05 ENCOUNTER — Telehealth (INDEPENDENT_AMBULATORY_CARE_PROVIDER_SITE_OTHER): Payer: Medicare PPO | Admitting: Physician Assistant

## 2019-12-05 ENCOUNTER — Other Ambulatory Visit: Payer: Self-pay

## 2019-12-05 DIAGNOSIS — E1151 Type 2 diabetes mellitus with diabetic peripheral angiopathy without gangrene: Secondary | ICD-10-CM

## 2019-12-05 DIAGNOSIS — IMO0002 Reserved for concepts with insufficient information to code with codable children: Secondary | ICD-10-CM

## 2019-12-05 DIAGNOSIS — E1165 Type 2 diabetes mellitus with hyperglycemia: Secondary | ICD-10-CM | POA: Diagnosis not present

## 2019-12-05 NOTE — Progress Notes (Signed)
I have discussed the procedure for the virtual visit with the patient who has given consent to proceed with assessment and treatment.   Marcus Norton, CMA     

## 2019-12-05 NOTE — Progress Notes (Signed)
Virtual Visit via Video   I connected with patient on 12/05/19 at 10:00 AM EDT by a video enabled telemedicine application and verified that I am speaking with the correct person using two identifiers.  Location patient: Home Location provider: [facility] Office Persons participating in the virtual visit: Patient, Provider, [place] (Patina Moore)  I discussed the limitations of evaluation and management by telemedicine and the availability of in person appointments. The patient expressed understanding and agreed to proceed.  Subjective:   HPI:   Patient presents via [place] today for follow-up of DM II, uncontrolled. Is currently on a regimen of Lantus 30 units daily, Metformin 1000 mg BID, taking as directed. Notes glucose was averaging 180 fasting up until the past couple of weeks. Now around 120-130 with dietary changes he has made. Continues to take his Losartan 25 mg daily as directed. Is currently followed by Cardiology with recent pacemaker placement. Has been doing well with this.   ROS:   See pertinent positives and negatives per HPI.  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 associated with type 2 diabetes mellitus ([place]) 06/11/2019   Moderate nonproliferative diabetic retinopathy of right eye with macular edema ([place]) 06/11/2019   Retinal hemorrhage of right eye 06/11/2019   Vitreomacular adhesion of both eyes 06/11/2019   Vitreomacular adhesion of right eye 06/11/2019   Chronic rhinitis 09/11/2018   OSA (obstructive sleep  apnea) 07/12/2018   Restless leg syndrome 07/12/2018   PSVT (paroxysmal supraventricular tachycardia) ([place]) 03/10/2018   Visit for preventive health examination 03/10/2018   Atypical chest pain 03/10/2018   Dysphagia 03/10/2018   Chronic cough 12/20/2017   CAD S/P percutaneous coronary angioplasty 07/20/2017   Exertional dyspnea 07/15/2017   GERD (gastroesophageal reflux disease) 04/14/2017   Hematuria 03/08/2017   Encounter for Medicare annual wellness exam 03/08/2017   Need for pneumococcal vaccination 03/08/2017   Status post coronary artery stent placement    Atypical angina ([place]) 07/29/2016   Abnormal nuclear stress test 07/29/2016   Angina pectoris ([place])    Constipation 05/12/2016   History of adenomatous polyp of colon 05/12/2016   Generalized anxiety disorder 03/04/2016   PAD (peripheral artery disease) ([place]) 02/26/2014   Arteriosclerotic cardiovascular disease -nonobstructive    Essential hypertension    History of tobacco abuse    Mixed hyperlipidemia 07/11/2009   ERECTILE DYSFUNCTION, 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   Abnormal liver function tests 07/11/2009    Social History   Tobacco Use   Smoking status: Former Smoker    Packs/day: 1.00    Years: 15.00    Pack years: 15.00    Types: Cigarettes    Start date: 03/08/1965    Quit date: 07/07/1993    Years since quitting: 26.4   Smokeless tobacco: Never Used  Substance Use Topics   Alcohol use: No    Alcohol/week: 0.0 standard drinks    Comment: 07/29/2016 "last drink was 4 wks ago; had a beer"    Current Outpatient Medications:    ACCU-CHEK AVIVA PLUS test strip, , Disp: , Rfl:    alfuzosin (UROXATRAL) 10 MG 24 hr tablet, Take 10 mg by mouth  daily with breakfast., Disp: , Rfl:    amLODipine (NORVASC) 5 MG tablet, Take 1 tablet (5 mg total) by mouth daily., Disp: 90 tablet, Rfl: 1   aspirin EC 81 MG tablet, Take 81 mg by mouth at bedtime. , Disp: , Rfl:    atorvastatin (LIPITOR) 10 MG  tablet, Take 1 tablet (10 mg total) by mouth once a week. (Patient taking differently: Take 10 mg by mouth every Wednesday. ), Disp: 90 tablet, Rfl: 1   Cholecalciferol (VITAMIN D3) 50 MCG (2000 UT) TABS, Take 2,000 Units by mouth daily., Disp: , Rfl:    fluticasone furoate-vilanterol (BREO ELLIPTA) 100-25 MCG/INH AEPB, Inhale 1 puff into the lungs daily., Disp: 180 each, Rfl: 2   LANTUS SOLOSTAR 100 UNIT/ML Solostar Pen, Inject 26 Units into the skin daily. (Patient taking differently: Inject 28 Units into the skin at bedtime. ), Disp: 15 mL, Rfl: 3   losartan (COZAAR) 25 MG tablet, TAKE 1 TABLET(25 MG) BY MOUTH DAILY (Patient taking differently: Take 25 mg by mouth in the morning. ), Disp: 90 tablet, Rfl: 1   metFORMIN (GLUCOPHAGE) 1000 MG tablet, Take 1 tablet (1,000 mg total) by mouth 2 (two) times daily with a meal., Disp: 180 tablet, Rfl: 1   metoprolol tartrate (LOPRESSOR) 50 MG tablet, Take 1 tablet (50 mg total) by mouth 2 (two) times daily., Disp: 180 tablet, Rfl: 3   montelukast (SINGULAIR) 10 MG tablet, Take 1 tablet (10 mg total) by mouth at bedtime., Disp: 30 tablet, Rfl: 11   MULTIPLE VITAMIN PO, Take 1 tablet by mouth daily., Disp: , Rfl:    Multiple Vitamins-Minerals (HAIR SKIN AND NAILS FORMULA) TABS, Take 1 tablet by mouth daily. Gummie, Disp: , Rfl:    nitroGLYCERIN (NITROSTAT) 0.4 MG SL tablet, Place 1 tablet (0.4 mg total) under the tongue every 5 (five) minutes as needed for chest pain., Disp: 5 tablet, Rfl: 0   Oxymetazoline HCl (NASAL SPRAY NA), Place 1 spray into the nose See admin instructions. Alternate each Nasal at bedtime, Disp: , Rfl:    pantoprazole (PROTONIX) 40 MG tablet, TAKE 1 TABLET(40 MG) BY MOUTH DAILY (Patient taking differently: Take 40 mg by mouth in the morning. ), Disp: 90 tablet, Rfl: 1   REPATHA SURECLICK 053 MG/ML SOAJ, ADMINISTER 1 ML UNDER THE SKIN EVERY 14 DAYS, Disp: 2 mL, Rfl: 12   sitaGLIPtin (JANUVIA) 100 MG tablet, Take 1 tablet (100  mg total) by mouth daily. (Patient taking differently: Take 100 mg by mouth in the morning. ), Disp: 90 tablet, Rfl: 1   tadalafil (CIALIS) 20 MG tablet, Take 0.5-1 tablets (10-20 mg total) by mouth every other day as needed for erectile dysfunction., Disp: 5 tablet, Rfl: 1   temazepam (RESTORIL) 15 MG capsule, Take 1 capsule (15 mg total) by mouth at bedtime as needed for sleep., Disp: 30 capsule, Rfl: 1   vitamin C (ASCORBIC ACID) 250 MG tablet, Take 500 mg by mouth daily. Gummie, Disp: , Rfl:   Current Facility-Administered Medications:    sodium chloride flush (NS) 0.9 % injection 3 mL, 3 mL, Intravenous, Q12H, Croitoru, Mihai, MD  Allergies  Allergen Reactions   Plavix [Clopidogrel Bisulfate] Shortness Of Breath   Oxycontin [Oxycodone Hcl] Other (See Comments)    Malaise   Penicillins Other (See Comments)    Cotton Mouth    Pioglitazone Other (See Comments)    Made his chest hurt   Prednisone Other (See Comments)    Reaction: muscle aches and soreness  Simvastatin Other (See Comments)    Myalgias    Objective:   There were no vitals taken for this visit.  Patient is well-developed, well-nourished in no acute distress.  Resting comfortably at home.  Head is normocephalic, atraumatic.  No labored breathing.  Speech is clear and coherent with logical content.  Patient is alert and oriented at baseline.   Assessment and Plan:   1. DM (diabetes mellitus) type II uncontrolled, periph vascular disorder ([place]) Want patient to work hard on being consistent with diet as this has helped tremendously with glucose levels. He would like to try working on this instead of adding another medication. Will have him keep close check on fasting glucose levels. Bring with him to lab appt next week to recheck A1C, CMP, Lipids. Continue ARB. Continue management per Cardiology. Will update foot exam at next visit. Plan to update annual flu shot when he comes in for lab visit.  -  Hemoglobin A1c; Future - Comp Met (CMET); Future - Lipid Profile; Future    Leeanne Rio, PA-C 12/05/2019

## 2019-12-08 ENCOUNTER — Other Ambulatory Visit: Payer: Self-pay | Admitting: Physician Assistant

## 2019-12-08 DIAGNOSIS — I10 Essential (primary) hypertension: Secondary | ICD-10-CM

## 2019-12-20 ENCOUNTER — Other Ambulatory Visit: Payer: Self-pay

## 2019-12-20 ENCOUNTER — Ambulatory Visit (INDEPENDENT_AMBULATORY_CARE_PROVIDER_SITE_OTHER): Payer: Medicare PPO

## 2019-12-20 ENCOUNTER — Ambulatory Visit: Payer: Medicare PPO | Admitting: Physician Assistant

## 2019-12-20 ENCOUNTER — Other Ambulatory Visit: Payer: Self-pay | Admitting: Physician Assistant

## 2019-12-20 ENCOUNTER — Encounter: Payer: Self-pay | Admitting: Physician Assistant

## 2019-12-20 DIAGNOSIS — E1165 Type 2 diabetes mellitus with hyperglycemia: Secondary | ICD-10-CM

## 2019-12-20 DIAGNOSIS — IMO0002 Reserved for concepts with insufficient information to code with codable children: Secondary | ICD-10-CM

## 2019-12-20 DIAGNOSIS — E119 Type 2 diabetes mellitus without complications: Secondary | ICD-10-CM

## 2019-12-20 DIAGNOSIS — E1151 Type 2 diabetes mellitus with diabetic peripheral angiopathy without gangrene: Secondary | ICD-10-CM | POA: Diagnosis not present

## 2019-12-20 LAB — LIPID PANEL
Cholesterol: 57 mg/dL (ref 0–200)
HDL: 34.5 mg/dL — ABNORMAL LOW (ref >39.00)
LDL Cholesterol: 12 mg/dL (ref 0–99)
NonHDL: 22.31
Total CHOL/HDL Ratio: 2
Triglycerides: 50 mg/dL (ref 0.0–149.0)
VLDL: 10 mg/dL (ref 0.0–40.0)

## 2019-12-20 LAB — COMPREHENSIVE METABOLIC PANEL
ALT: 27 U/L (ref 0–53)
AST: 16 U/L (ref 0–37)
Albumin: 4.4 g/dL (ref 3.5–5.2)
Alkaline Phosphatase: 57 U/L (ref 39–117)
BUN: 19 mg/dL (ref 6–23)
CO2: 32 mEq/L (ref 19–32)
Calcium: 9.3 mg/dL (ref 8.4–10.5)
Chloride: 98 mEq/L (ref 96–112)
Creatinine, Ser: 0.82 mg/dL (ref 0.40–1.50)
GFR: 82.8 mL/min (ref >60.00)
Glucose, Bld: 143 mg/dL — ABNORMAL HIGH (ref 70–99)
Potassium: 5.3 mEq/L — ABNORMAL HIGH (ref 3.5–5.1)
Sodium: 139 mEq/L (ref 135–145)
Total Bilirubin: 0.9 mg/dL (ref 0.2–1.2)
Total Protein: 6.4 g/dL (ref 6.0–8.3)

## 2019-12-20 LAB — HEMOGLOBIN A1C: Hgb A1c MFr Bld: 8.6 % — ABNORMAL HIGH (ref 4.6–6.5)

## 2019-12-20 MED ORDER — SITAGLIPTIN PHOSPHATE 100 MG PO TABS: 100.0000 mg | ORAL_TABLET | Freq: Every day | ORAL | 1 refills | Status: DC

## 2019-12-20 MED ORDER — LANTUS SOLOSTAR 100 UNIT/ML ~~LOC~~ SOPN: 30.0000 [IU] | PEN_INJECTOR | Freq: Every day | SUBCUTANEOUS | 2 refills | Status: DC

## 2019-12-20 MED ORDER — PEN NEEDLES 32G X 4 MM MISC: 1 refills | Status: AC

## 2019-12-20 NOTE — Progress Notes (Signed)
Patient presents to clinic today with recent blood glucose level readings. Patient recently seen via video visit for follow-up DM II. At that time was encouraged to keep a consistent diet as his morning glucose levels look good when he does so. He endorses that he has been trying to do a better job with diet but has not been as consistent as he should be. Is taking his Lantus and Januvia as directed. Notes glucose averaging 110-130 in the morning. Denies low glucose levels. Patient denies chest pain, palpitations, lightheadedness, dizziness, vision changes or frequent headaches.  BP Readings from Last 3 Encounters:  12/20/19 124/60  10/30/19 (!) 160/60  10/15/19 (!) 159/50    Past Medical History:  Diagnosis Date  . 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 Arthritis    "lower back" (07/29/2016)  . ASCVD (arteriosclerotic cardiovascular disease)    -luminal irregularities in 1998 and 2005; normal EF  . Benign prostatic hypertrophy    status post transurethral resection of the prostate  . CAD (coronary artery disease)    a. Cath 07/29/16 99% ostial RCA & 70% pRCA s/p cutting balloon angioplasty and single SYNERGY DES 2.5X28. 100% very Distal 1st RPLB lesion --> the occlusion site moved further distal with guidewire advancement;  40% ostial LAD; 50% Ost Cx to Prox Cx lesion.  . Coronary artery disease   . Dysphagia   . Erectile dysfunction   . GERD (gastroesophageal reflux disease)   . Heart murmur   . History of hiatal hernia   . History of kidney stones   . Hyperlipidemia   . Hypertension   . Seasonal allergies   . Tobacco abuse    20 pack years; discontinued 1995  . Type II diabetes mellitus ([place])     Current Outpatient Medications on File Prior to Visit  Medication Sig Dispense Refill  . ACCU-CHEK AVIVA PLUS test strip     . alfuzosin (UROXATRAL) 10 MG 24 hr tablet Take 10 mg by mouth daily with breakfast.    . amLODipine (NORVASC) 5 MG tablet Take 1 tablet (5 mg total) by mouth  daily. 90 tablet 1  . aspirin EC 81 MG tablet Take 81 mg by mouth at bedtime.     [name] atorvastatin (LIPITOR) 10 MG tablet Take 1 tablet (10 mg total) by mouth once a week. (Patient taking differently: Take 10 mg by mouth every Wednesday. ) 90 tablet 1  . Cholecalciferol (VITAMIN D3) 50 MCG (2000 UT) TABS Take 2,000 Units by mouth daily.    . fluticasone furoate-vilanterol (BREO ELLIPTA) 100-25 MCG/INH AEPB Inhale 1 puff into the lungs daily. 180 each 2  . LANTUS SOLOSTAR 100 UNIT/ML Solostar Pen Inject 26 Units into the skin daily. (Patient taking differently: Inject 30 Units into the skin at bedtime. ) 15 mL 3  . losartan (COZAAR) 25 MG tablet TAKE 1 TABLET(25 MG) BY MOUTH DAILY 90 tablet 1  . metFORMIN (GLUCOPHAGE) 1000 MG tablet Take 1 tablet (1,000 mg total) by mouth 2 (two) times daily with a meal. 180 tablet 1  . metoprolol tartrate (LOPRESSOR) 50 MG tablet Take 1 tablet (50 mg total) by mouth 2 (two) times daily. 180 tablet 3  . montelukast (SINGULAIR) 10 MG tablet Take 1 tablet (10 mg total) by mouth at bedtime. 30 tablet 11  . MULTIPLE VITAMIN PO Take 1 tablet by mouth daily.    . Multiple Vitamins-Minerals (HAIR SKIN AND NAILS FORMULA) TABS Take 1 tablet by mouth daily. Gummie    .  Oxymetazoline HCl (NASAL SPRAY NA) Place 1 spray into the nose See admin instructions. Alternate each Nasal at bedtime    . pantoprazole (PROTONIX) 40 MG tablet TAKE 1 TABLET(40 MG) BY MOUTH DAILY (Patient taking differently: Take 40 mg by mouth in the morning. ) 90 tablet 1  . REPATHA SURECLICK 008 MG/ML SOAJ ADMINISTER 1 ML UNDER THE SKIN EVERY 14 DAYS 2 mL 12  . sitaGLIPtin (JANUVIA) 100 MG tablet Take 1 tablet (100 mg total) by mouth daily. (Patient taking differently: Take 100 mg by mouth in the morning. ) 90 tablet 1  . tadalafil (CIALIS) 20 MG tablet Take 0.5-1 tablets (10-20 mg total) by mouth every other day as needed for erectile dysfunction. 5 tablet 1  . vitamin C (ASCORBIC ACID) 250 MG tablet Take 500  mg by mouth daily. Gummie    . nitroGLYCERIN (NITROSTAT) 0.4 MG SL tablet Place 1 tablet (0.4 mg total) under the tongue every 5 (five) minutes as needed for chest pain. (Patient not taking: Reported on 12/20/2019) 5 tablet 0   No current facility-administered medications on file prior to visit.    Allergies  Allergen Reactions  . Plavix [Clopidogrel Bisulfate] Shortness Of Breath  . Oxycontin [Oxycodone Hcl] Other (See Comments)    Malaise  . Penicillins Other (See Comments)    Cotton Mouth   . Pioglitazone Other (See Comments)    Made his chest hurt  . Prednisone Other (See Comments)    Reaction: muscle aches and soreness  . Simvastatin Other (See Comments)    Myalgias    Family History  Problem Relation Age of Onset  . Ovarian cancer Mother   . Colon cancer Sister 21       deceased from colon cancer    Social History   Socioeconomic History  . Marital status: Married    Spouse name: Not on file  . Number of children: 2  . Years of education: Not on file  . Highest education level: Not on file  Occupational History  . Occupation: Dietitian, 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    Comment: [employer]  Tobacco Use  . Smoking status: Former Smoker    Packs/day: 1.00    Years: 15.00    Pack years: 15.00    Types: Cigarettes    Start date: 03/08/1965    Quit date: 07/07/1993    Years since quitting: 26.4  . Smokeless tobacco: Never Used  Vaping Use  . Vaping Use: Never used  Substance and Sexual Activity  . Alcohol use: No    Alcohol/week: 0.0 standard drinks    Comment: 07/29/2016 "last drink was 4 wks ago; had a beer"  . Drug use: No  . Sexual activity: Not Currently  Other Topics Concern  . Not on file  Social History Narrative   5 grandchildren    Social Determinants of Health   Financial Resource Strain:   . Difficulty of Paying Living Expenses: Not on file  Food Insecurity:   . Worried About Charity fundraiser in the Last Year: Not on file    . Ran Out of Food in the Last Year: Not on file  Transportation Needs:   . Lack of Transportation (Medical): Not on file  . Lack of Transportation (Non-Medical): Not on file  Physical Activity:   . Days of Exercise per Week: Not on file  . Minutes of Exercise per Session: Not on file  Stress:   . Feeling of Stress : Not on file  Social Connections:   . Frequency of Communication with Friends and Family: Not on file  . Frequency of Social Gatherings with Friends and Family: Not on file  . Attends Religious Services: Not on file  . Active Member of Clubs or Organizations: Not on file  . Attends Archivist Meetings: Not on file  . Marital Status: Not on file   Review of Systems - See HPI.  All other ROS are negative.  BP 124/60   Pulse (!) 59   Temp 98 F (36.7 C) (Temporal)   Resp 16   Ht '5\' 7"'  (1.702 m)   Wt 180 lb (81.6 kg)   SpO2 97%   BMI 28.19 kg/m   Physical Exam Vitals reviewed.  Constitutional:      General: He is not in acute distress.    Appearance: He is well-developed. He is not diaphoretic.  HENT:     Head: Normocephalic and atraumatic.     Mouth/Throat:     Pharynx: No posterior oropharyngeal erythema.  Eyes:     Conjunctiva/sclera: Conjunctivae normal.     Pupils: Pupils are equal, round, and reactive to light.  Neck:     Thyroid: No thyromegaly.  Pulmonary:     Effort: Pulmonary effort is normal.  Abdominal:     General: Bowel sounds are normal. There is no distension.     Palpations: Abdomen is soft. There is no mass.     Tenderness: There is no abdominal tenderness. There is no guarding or rebound.  Musculoskeletal:     Cervical back: Neck supple.  Lymphadenopathy:     Cervical: No cervical adenopathy.  Skin:    General: Skin is warm and dry.     Findings: No rash.  Neurological:     General: No focal deficit present.     Mental Status: He is alert and oriented to person, place, and time.     Cranial Nerves: No cranial nerve  deficit.  Psychiatric:        Mood and Affect: Mood normal.     Recent Results (from the past 2160 hour(s))  CBC With Differential     Status: Abnormal   Collection Time: 10/01/19  2:06 PM  Result Value Ref Range   WBC 10.6 3.4 - 10.8 x10E3/uL   RBC 4.03 (L) 4.14 - 5.80 x10E6/uL   Hemoglobin 13.6 13.0 - 17.7 g/dL   Hematocrit 41.2 37.5 - 51.0 %   MCV 102 (H) 79 - 97 fL   MCH 33.7 (H) 26.6 - 33.0 pg   MCHC 33.0 31 - 35 g/dL   RDW 12.3 11.6 - 15.4 %   Neutrophils 74 Not Estab. %   Lymphs 17 Not Estab. %   Monocytes 5 Not Estab. %   Eos 3 Not Estab. %   Basos 1 Not Estab. %   Neutrophils Absolute 7.8 (H) 1.40 - 7.00 x10E3/uL   Lymphocytes Absolute 1.8 0 - 3 x10E3/uL   Monocytes Absolute 0.5 0 - 0 x10E3/uL   EOS (ABSOLUTE) 0.3 0.0 - 0.4 x10E3/uL   Basophils Absolute 0.1 0 - 0 x10E3/uL   Immature Granulocytes 0 Not Estab. %   Immature Grans (Abs) 0.0 0.0 - 0.1 B26O0/BT  Basic metabolic panel     Status: Abnormal   Collection Time: 10/01/19  2:06 PM  Result Value Ref Range   Glucose 116 (H) 65 - 99 mg/dL   BUN 20 8 - 27 mg/dL   Creatinine, Ser 0.88 0.76 - 1.27 mg/dL  GFR calc non Af Amer 81 >59 mL/min/1.73   GFR calc Af Amer 94 >59 mL/min/1.73    Comment: **[facility] currently reports eGFR in compliance with the current**   recommendations of the [employer]. [facility] will   update reporting as new guidelines are published from the NKF-ASN   Task force.    BUN/Creatinine Ratio 23 10 - 24   Sodium 140 134 - 144 mmol/L   Potassium 5.4 (H) 3.5 - 5.2 mmol/L   Chloride 100 96 - 106 mmol/L   CO2 24 20 - 29 mmol/L   Calcium 9.6 8.6 - 10.2 mg/dL  SARS CORONAVIRUS 2 (TAT 6-24 HRS) Nasopharyngeal Nasopharyngeal Swab     Status: None   Collection Time: 10/11/19  2:25 PM   Specimen: Nasopharyngeal Swab  Result Value Ref Range   SARS Coronavirus 2 NEGATIVE NEGATIVE    Comment: (NOTE) SARS-CoV-2 target nucleic acids are NOT DETECTED.  The SARS-CoV-2 RNA is generally  detectable in upper and lower respiratory specimens during the acute phase of infection. Negative results do not preclude SARS-CoV-2 infection, do not rule out co-infections with other pathogens, and should not be used as the sole basis for treatment or other patient management decisions. Negative results must be combined with clinical observations, patient history, and epidemiological information. The expected result is Negative.  Fact Sheet for Patients: SugarRoll.be  Fact Sheet for Healthcare Providers: https://www.woods-mathews.com/  This test is not yet approved or cleared by the [place] FDA and  has been authorized for detection and/or diagnosis of SARS-CoV-2 by FDA under an Emergency Use Authorization (EUA). This EUA will remain  in effect (meaning this test can be used) for the duration of the COVID-19 declaration under Se ction 564(b)(1) of the Act, 21 U.S.C. section 360bbb-3(b)(1), unless the authorization is terminated or revoked sooner.  Performed at [facility], [place] [address], [place], [place] [address]   Glucose, capillary     Status: Abnormal   Collection Time: 10/15/19 11:58 AM  Result Value Ref Range   Glucose-Capillary 213 (H) 70 - 99 mg/dL    Comment: Glucose reference range applies only to samples taken after fasting for at least 8 hours.   Comment 1 Notify RN    Comment 2 Document in Chart   Glucose, capillary     Status: Abnormal   Collection Time: 10/15/19  3:05 PM  Result Value Ref Range   Glucose-Capillary 229 (H) 70 - 99 mg/dL    Comment: Glucose reference range applies only to samples taken after fasting for at least 8 hours.   Comment 1 Notify RN     Assessment/Plan: 1. Diabetes mellitus without complication (HCC) Medications refilled. Reviewed diet and activity level. Will continue current regimen for now, continuing to work on diet. Follow-up in 1 month with glucose levels. Sooner if needed.  Continue other chronic medications as directed.  - LANTUS SOLOSTAR 100 UNIT/ML Solostar Pen; Inject 30 Units into the skin at bedtime.  Dispense: 15 mL; Refill: 2  This visit occurred during the SARS-CoV-2 public health emergency.  Safety protocols were in place, including screening questions prior to the visit, additional usage of staff PPE, and extensive cleaning of exam room while observing appropriate contact time as indicated for disinfecting solutions.     Leeanne Rio, PA-C

## 2019-12-20 NOTE — Patient Instructions (Signed)
Please keep up with current medication regimen.  I have sent in refills and new pen needles for you that should work better.   You need to increase your protein intake slightly -- instead of plain fruit as a snack, add on some nuts or a Tablespoon of peanut butter. Also could have small cup of greek yogurt instead.   Follow the meal planning guide given.   I will call with results and we will make further adjustments if indicated.

## 2020-01-03 ENCOUNTER — Telehealth: Payer: Self-pay | Admitting: Physician Assistant

## 2020-01-03 ENCOUNTER — Other Ambulatory Visit: Payer: Self-pay

## 2020-01-03 DIAGNOSIS — E782 Mixed hyperlipidemia: Secondary | ICD-10-CM

## 2020-01-03 NOTE — Telephone Encounter (Signed)
Called patient back on his cell phone. See result notes.

## 2020-01-03 NOTE — Telephone Encounter (Signed)
Pt calling in asking for lab results, please call the cell #

## 2020-01-08 ENCOUNTER — Ambulatory Visit: Payer: Medicare PPO | Admitting: Endocrinology

## 2020-01-09 DIAGNOSIS — L570 Actinic keratosis: Secondary | ICD-10-CM | POA: Diagnosis not present

## 2020-01-09 DIAGNOSIS — B078 Other viral warts: Secondary | ICD-10-CM | POA: Diagnosis not present

## 2020-01-09 DIAGNOSIS — X32XXXD Exposure to sunlight, subsequent encounter: Secondary | ICD-10-CM | POA: Diagnosis not present

## 2020-01-14 ENCOUNTER — Other Ambulatory Visit: Payer: Self-pay

## 2020-01-14 ENCOUNTER — Ambulatory Visit (INDEPENDENT_AMBULATORY_CARE_PROVIDER_SITE_OTHER): Payer: Medicare PPO

## 2020-01-14 DIAGNOSIS — E875 Hyperkalemia: Secondary | ICD-10-CM

## 2020-01-14 LAB — BASIC METABOLIC PANEL
BUN: 26 mg/dL — ABNORMAL HIGH (ref 6–23)
CO2: 31 mEq/L (ref 19–32)
Calcium: 9.8 mg/dL (ref 8.4–10.5)
Chloride: 99 mEq/L (ref 96–112)
Creatinine, Ser: 0.91 mg/dL (ref 0.40–1.50)
GFR: 79.41 mL/min (ref >60.00)
Glucose, Bld: 146 mg/dL — ABNORMAL HIGH (ref 70–99)
Potassium: 5.3 mEq/L — ABNORMAL HIGH (ref 3.5–5.1)
Sodium: 137 mEq/L (ref 135–145)

## 2020-01-14 NOTE — Progress Notes (Signed)
Patient seen today for nurse visit, BP 124/60

## 2020-01-14 NOTE — Progress Notes (Signed)
CMA BP check note reviewed. BP stable. Continue current regimen.   Leeanne Rio, PA-C

## 2020-01-15 LAB — CUP PACEART REMOTE DEVICE CHECK
Battery Remaining Longevity: 89 mo
Battery Remaining Percentage: 95.5 %
Battery Voltage: 3.04 V
Brady Statistic AP VP Percent: 1 %
Brady Statistic AP VS Percent: 30 %
Brady Statistic AS VP Percent: 0 %
Brady Statistic AS VS Percent: 70 %
Brady Statistic RA Percent Paced: 28 %
Brady Statistic RV Percent Paced: 1 %
Date Time Interrogation Session: 20211123040019
Implantable Lead Implant Date: 20210823
Implantable Lead Implant Date: 20210823
Implantable Lead Location: 753859
Implantable Lead Location: 753860
Implantable Pulse Generator Implant Date: 20210823
Lead Channel Impedance Value: 410 Ohm
Lead Channel Impedance Value: 650 Ohm
Lead Channel Pacing Threshold Amplitude: 0.75 V
Lead Channel Pacing Threshold Amplitude: 1 V
Lead Channel Pacing Threshold Pulse Width: 0.5 ms
Lead Channel Pacing Threshold Pulse Width: 0.5 ms
Lead Channel Sensing Intrinsic Amplitude: 12 mV
Lead Channel Sensing Intrinsic Amplitude: 2.9 mV
Lead Channel Setting Pacing Amplitude: 3.5 V
Lead Channel Setting Pacing Amplitude: 3.5 V
Lead Channel Setting Pacing Pulse Width: 0.5 ms
Lead Channel Setting Sensing Sensitivity: 2 mV
Pulse Gen Model: 2272
Pulse Gen Serial Number: 3859349

## 2020-01-16 ENCOUNTER — Ambulatory Visit (INDEPENDENT_AMBULATORY_CARE_PROVIDER_SITE_OTHER): Payer: Medicare PPO | Admitting: Cardiovascular Disease

## 2020-01-16 ENCOUNTER — Other Ambulatory Visit: Payer: Self-pay

## 2020-01-16 ENCOUNTER — Encounter: Payer: Self-pay | Admitting: Cardiovascular Disease

## 2020-01-16 VITALS — BP 148/58 | HR 63 | Ht 67.0 in | Wt 179.8 lb

## 2020-01-16 DIAGNOSIS — Z950 Presence of cardiac pacemaker: Secondary | ICD-10-CM | POA: Diagnosis not present

## 2020-01-16 DIAGNOSIS — I739 Peripheral vascular disease, unspecified: Secondary | ICD-10-CM | POA: Diagnosis not present

## 2020-01-16 DIAGNOSIS — I495 Sick sinus syndrome: Secondary | ICD-10-CM

## 2020-01-16 DIAGNOSIS — I25118 Atherosclerotic heart disease of native coronary artery with other forms of angina pectoris: Secondary | ICD-10-CM | POA: Diagnosis not present

## 2020-01-16 DIAGNOSIS — E1151 Type 2 diabetes mellitus with diabetic peripheral angiopathy without gangrene: Secondary | ICD-10-CM | POA: Diagnosis not present

## 2020-01-16 DIAGNOSIS — I10 Essential (primary) hypertension: Secondary | ICD-10-CM

## 2020-01-16 DIAGNOSIS — I6523 Occlusion and stenosis of bilateral carotid arteries: Secondary | ICD-10-CM

## 2020-01-16 DIAGNOSIS — E1165 Type 2 diabetes mellitus with hyperglycemia: Secondary | ICD-10-CM

## 2020-01-16 DIAGNOSIS — I471 Supraventricular tachycardia: Secondary | ICD-10-CM | POA: Diagnosis not present

## 2020-01-16 DIAGNOSIS — E782 Mixed hyperlipidemia: Secondary | ICD-10-CM

## 2020-01-16 DIAGNOSIS — IMO0002 Reserved for concepts with insufficient information to code with codable children: Secondary | ICD-10-CM

## 2020-01-16 DIAGNOSIS — I350 Nonrheumatic aortic (valve) stenosis: Secondary | ICD-10-CM

## 2020-01-16 LAB — PACEMAKER DEVICE OBSERVATION

## 2020-01-16 NOTE — Patient Instructions (Signed)

## 2020-01-18 ENCOUNTER — Encounter: Payer: Self-pay | Admitting: Cardiovascular Disease

## 2020-01-18 NOTE — Progress Notes (Signed)
Cardiology Office Note:    Date:  01/18/2020   ID:  Marcus Norton, DOB [birth year], MRN [id number]  PCP:  [name], PA-C  [facility] Cardiologist:  [name], MD  [facility] Electrophysiologist:  None   Referring MD: [name], PA-C   Chief Complaint  Patient presents with  . Pacemaker Check    History of Present Illness:    Marcus Norton is a 79 y.o. male with a hx of PAD (bilateral iliac kissing stent placement October 2017, occlusion of right SFA with popliteal reconstitution, diffuse moderate left SFA stenosis), SVT, CAD (single-vessel disease ostial RCA stent 2018, occluded second posterolateral ventricular branch), mild-moderate aortic stenosis, moderate bilateral carotid artery disease by CT, HTN, DM type 2 on insulin,  hypercholesterolemia (on statin + PCSK9 inhibitor).  In August 2021 he underwent implantation of a dual-chamber permanent pacemaker ([place]) for tachycardia-bradycardia syndrome (symptomatic sinus pauses and recurrent episodes of paroxysmal ectopic atrial tachycardia).  The site has healed well and he has noticed improved tolerance to certain types of activity such as climbing stairs.  He has not had any dizzy spells and denies fatigue.  He has not had dyspnea at rest or with activity.  Pacemaker interrogation shows normal device function.  Battery is at beginning of life and even before reducing the outputs to chronic levels estimated generator longevity was 9-11 years.  There have been no episodes of atrial mode switch or high ventricular rates and he never requires ventricular pacing.  There is 28% atrial pacing and his histograms are relatively blunted.  The rate response sensor was changed from passive to active today.  The echocardiogram performed August 16, 2019 showed normal LVEF 65-70%, mild LVH, diastolic dysfunction with elevated filling pressures, degenerative aortic valve changes with mild-moderate stenosis  (mean gradient 18 mmHg, dimensionless index 0.32, calculated aortic valve area 1.12 cm.  His losartan is currently on hold after labs showed hyperkalemia.  He is due for repeat labs in a couple of days.  Past Medical History:  Diagnosis Date  . 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 Arthritis    "lower back" (07/29/2016)  . ASCVD (arteriosclerotic cardiovascular disease)    -luminal irregularities in 1998 and 2005; normal EF  . Benign prostatic hypertrophy    status post transurethral resection of the prostate  . CAD (coronary artery disease)    a. Cath 07/29/16 99% ostial RCA & 70% pRCA s/p cutting balloon angioplasty and single SYNERGY DES 2.5X28. 100% very Distal 1st RPLB lesion --> the occlusion site moved further distal with guidewire advancement;  40% ostial LAD; 50% Ost Cx to Prox Cx lesion.  . Coronary artery disease   . Dysphagia   . Erectile dysfunction   . GERD (gastroesophageal reflux disease)   . Heart murmur   . History of hiatal hernia   . History of kidney stones   . Hyperlipidemia   . Hypertension   . Seasonal allergies   . Tobacco abuse    20 pack years; discontinued 1995  . Type II diabetes mellitus ([place])     Past Surgical History:  Procedure Laterality Date  . APPENDECTOMY    . CATARACT EXTRACTION W/ INTRAOCULAR LENS  IMPLANT, BILATERAL Bilateral   . COLONOSCOPY  07/20/2011   Dr. Gala Romney: multiple hyperplastic polyps. Surveillance 2018  . COLONOSCOPY N/A 06/30/2016   Procedure: COLONOSCOPY;  Surgeon: Daneil Dolin, MD;  [facility];  Service: Endoscopy;  Laterality: N/A;  10:00am  .  COLONOSCOPY W/ BIOPSIES AND POLYPECTOMY  07/08/08, 06/2011   Dr Madolyn Frieze papilla, diminutive rectal polyp ablated the, left-sided diverticula, piecemeal polypectomy of hepatic flexure adenomatous polyp, diminutive polyps near hepatic flexure ablated  . CORONARY ANGIOPLASTY WITH STENT PLACEMENT  07/29/2016  . CORONARY STENT INTERVENTION N/A 07/29/2016   Procedure: Coronary  Stent Intervention;  Surgeon: Leonie Man, MD;  Location: [facility];  Service: Cardiovascular;  Laterality: N/A;  RCA  . ESOPHAGOGASTRODUODENOSCOPY  2013   erosive reflux esophagitis, s/p empiric dilation. chronic duodenitis.   [name] LEFT HEART CATH AND CORONARY ANGIOGRAPHY N/A 07/29/2016   Procedure: Left Heart Cath and Coronary Angiography;  Surgeon: Leonie Man, MD;  Location: [place];  Service: Cardiovascular;  Laterality: N/A;  . NASAL SEPTUM SURGERY    . NASAL SINUS SURGERY     "cleaned out my sinus"  . PACEMAKER IMPLANT N/A 10/15/2019   Procedure: PACEMAKER IMPLANT;  Surgeon: Sanda Klein, MD;  Location: [facility];  Service: Cardiovascular;  Laterality: N/A;  . PERIPHERAL VASCULAR CATHETERIZATION N/A 11/26/2015   Procedure: Abdominal Aortogram w/Lower Extremity;  Surgeon: Wellington Hampshire, MD;  Location: [facility];  Service: Cardiovascular;  Laterality: N/A;  . TONSILLECTOMY    . TRANSURETHRAL RESECTION OF PROSTATE  2009    Current Medications: Current Meds  Medication Sig  . ACCU-CHEK AVIVA PLUS test strip   . alfuzosin (UROXATRAL) 10 MG 24 hr tablet Take 10 mg by mouth daily with breakfast.  . amLODipine (NORVASC) 5 MG tablet Take 1 tablet (5 mg total) by mouth daily.  [name] aspirin EC 81 MG tablet Take 81 mg by mouth at bedtime.   [name] atorvastatin (LIPITOR) 10 MG tablet Take 1 tablet (10 mg total) by mouth once a week. (Patient taking differently: Take 10 mg by mouth every Wednesday. )  . Cholecalciferol (VITAMIN D3) 50 MCG (2000 UT) TABS Take 2,000 Units by mouth daily.  . fluticasone furoate-vilanterol (BREO ELLIPTA) 100-25 MCG/INH AEPB Inhale 1 puff into the lungs daily.  . Insulin Pen Needle (PEN NEEDLES) 32G X 4 MM MISC To use daily with Lantus solostar. Dx E11.9  . LANTUS SOLOSTAR 100 UNIT/ML Solostar Pen Inject 30 Units into the skin at bedtime.  [name] losartan (COZAAR) 25 MG tablet TAKE 1 TABLET(25 MG) BY MOUTH DAILY  . metFORMIN  (GLUCOPHAGE) 1000 MG tablet Take 1 tablet (1,000 mg total) by mouth 2 (two) times daily with a meal.  . metoprolol tartrate (LOPRESSOR) 50 MG tablet Take 1 tablet (50 mg total) by mouth 2 (two) times daily.  . montelukast (SINGULAIR) 10 MG tablet Take 1 tablet (10 mg total) by mouth at bedtime.  . MULTIPLE VITAMIN PO Take 1 tablet by mouth daily.  . Multiple Vitamins-Minerals (HAIR SKIN AND NAILS FORMULA) TABS Take 1 tablet by mouth daily. Gummie  . nitroGLYCERIN (NITROSTAT) 0.4 MG SL tablet Place 1 tablet (0.4 mg total) under the tongue every 5 (five) minutes as needed for chest pain.  [name] Oxymetazoline HCl (NASAL SPRAY NA) Place 1 spray into the nose See admin instructions. Alternate each Nasal at bedtime  . pantoprazole (PROTONIX) 40 MG tablet TAKE 1 TABLET(40 MG) BY MOUTH DAILY (Patient taking differently: Take 40 mg by mouth in the morning. )  . REPATHA SURECLICK 654 MG/ML SOAJ ADMINISTER 1 ML UNDER THE SKIN EVERY 14 DAYS  . sitaGLIPtin (JANUVIA) 100 MG tablet Take 1 tablet (100 mg total) by mouth daily.  . tadalafil (CIALIS) 20 MG tablet Take  0.5-1 tablets (10-20 mg total) by mouth every other day as needed for erectile dysfunction.  . vitamin C (ASCORBIC ACID) 250 MG tablet Take 500 mg by mouth daily. Gummie     Allergies:   Plavix [clopidogrel bisulfate], Oxycontin [oxycodone hcl], Penicillins, Pioglitazone, Prednisone, and Simvastatin   Social History   Socioeconomic History  . Marital status: Married    Spouse name: Not on file  . Number of children: 2  . Years of education: Not on file  . Highest education level: Not on file  Occupational History  . Occupation: Dietitian, 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    Comment: [employer]  Tobacco Use  . Smoking status: Former Smoker    Packs/day: 1.00    Years: 15.00    Pack years: 15.00    Types: Cigarettes    Start date: 03/08/1965    Quit date: 07/07/1993    Years since quitting: 26.5  . Smokeless tobacco: Never Used   Vaping Use  . Vaping Use: Never used  Substance and Sexual Activity  . Alcohol use: No    Alcohol/week: 0.0 standard drinks    Comment: 07/29/2016 "last drink was 4 wks ago; had a beer"  . Drug use: No  . Sexual activity: Not Currently  Other Topics Concern  . Not on file  Social History Narrative   5 grandchildren    Social Determinants of Health   Financial Resource Strain:   . Difficulty of Paying Living Expenses: Not on file  Food Insecurity:   . Worried About Charity fundraiser in the Last Year: Not on file  . Ran Out of Food in the Last Year: Not on file  Transportation Needs:   . Lack of Transportation (Medical): Not on file  . Lack of Transportation (Non-Medical): Not on file  Physical Activity:   . Days of Exercise per Week: Not on file  . Minutes of Exercise per Session: Not on file  Stress:   . Feeling of Stress : Not on file  Social Connections:   . Frequency of Communication with Friends and Family: Not on file  . Frequency of Social Gatherings with Friends and Family: Not on file  . Attends Religious Services: Not on file  . Active Member of Clubs or Organizations: Not on file  . Attends Archivist Meetings: Not on file  . Marital Status: Not on file     Family History: The patient's family history includes Colon cancer (age of onset: 80) in his sister; Ovarian cancer in his mother.  ROS:   Please see the history of present illness.   All other systems are reviewed and are negative.   EKGs/Labs/Other Studies Reviewed:    The following studies were reviewed today: Echo 08/16/2019 1. Left ventricular ejection fraction, by estimation, is 65 to 70%. The  left ventricle has normal function. The left ventricle has no regional  wall motion abnormalities. There is mild concentric left ventricular  hypertrophy. Left ventricular diastolic  parameters are indeterminate. Elevated left ventricular end-diastolic  pressure. The average left ventricular  global longitudinal strain is -21.8  %. The global longitudinal strain is normal.  2. Right ventricular systolic function is normal. The right ventricular  size is normal. Tricuspid regurgitation signal is inadequate for assessing  PA pressure.  3. The mitral valve is normal in structure. Mild mitral valve  regurgitation. No evidence of mitral stenosis.  4. The aortic valve has an indeterminant number of cusps. Aortic valve  regurgitation  is trivial. Mild to moderate aortic valve stenosis. Aortic  valve area, by VTI measures 1.12 cm. Aortic valve mean gradient measures  18.0 mmHg. Aortic valve Vmax  measures 2.68 m/s. DI 0.32.  5. Aortic dilatation noted. 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  6. The inferior vena cava is normal in size with greater than 50%  respiratory variability, suggesting right atrial pressure of 3 mmHg.  Event monitor 2 weeks 09/20/2019 Normal sinus rhythm with an average heart rate of 75 bpm. 14 runs of SVT, the longest lasted 9.4 seconds with a rate of 134 bpm. 3-second pause which happened at 11 in the morning.  This was a triggered event but symptoms were not specified. Frequent PACs with a burden of 10.1%.  Some of the triggered events correlated with PACs. Rare PVCs with a burden of less than 1%.  Comprehensive pacemaker check including testing of the lead pacing thresholds in the office today   EKG:  EKG is ordered today.  It shows atrial paced, ventricular sensed rhythm with occasional premature atrial complexes, left anterior fascicular block, no ischemic repolarization abnormalities, QTC 442 ms.  Recent Labs: 08/07/2019: Platelets 247.0; TSH 3.17 10/01/2019: Hemoglobin 13.6 12/20/2019: ALT 27 01/14/2020: BUN 26; Creatinine, Ser 0.91; Potassium 5.3 No hemolysis seen; Sodium 137  Recent Lipid Panel    Component Value Date/Time   CHOL 57 12/20/2019 0903   CHOL 75 (L) 02/06/2019 1052   TRIG 50.0 12/20/2019 0903   TRIG 96  03/12/2008 0000   HDL 34.50 (L) 12/20/2019 0903   HDL 37 (L) 02/06/2019 1052   CHOLHDL 2 12/20/2019 0903   VLDL 10.0 12/20/2019 0903   LDLCALC 12 12/20/2019 0903   LDLCALC 24 02/06/2019 1052   LDLCALC 91 03/12/2008 0000     Physical Exam:    VS:  BP (!) 148/58   Pulse 63   Ht 5\' 7"  (1.702 m)   Wt 179 lb 12.8 oz (81.6 kg)   SpO2 98%   BMI 28.16 kg/m     Wt Readings from Last 3 Encounters:  01/16/20 179 lb 12.8 oz (81.6 kg)  12/20/19 180 lb (81.6 kg)  10/30/19 179 lb (81.2 kg)     General: Alert, oriented x3, no distress, appears younger than stated age.  The left subclavian pacemaker site is well-healed. Head: no evidence of trauma, PERRL, EOMI, no exophtalmos or lid lag, no myxedema, no xanthelasma; normal ears, nose and oropharynx Neck: normal jugular venous pulsations and no hepatojugular reflux; brisk carotid pulses without delay and no carotid bruits Chest: clear to auscultation, no signs of consolidation by percussion or palpation, normal fremitus, symmetrical and full respiratory excursions Cardiovascular: normal position and quality of the apical impulse, regular rhythm, normal first and second heart sounds, early peaking 2-3/6 aortic ejection murmur heard at the right upper sternal border and mid sternal areas no diastolic murmurs, rubs or gallops Abdomen: no tenderness or distention, no masses by palpation, no abnormal pulsatility or arterial bruits, normal bowel sounds, no hepatosplenomegaly Extremities: no clubbing, cyanosis or edema; 2+ radial, ulnar and brachial pulses bilaterally; 2+ right femoral, posterior tibial and dorsalis pedis pulses; 2+ left femoral, posterior tibial and dorsalis pedis pulses; no subclavian or femoral bruits Neurological: grossly nonfocal Psych: Normal mood and affect   ASSESSMENT:    1. Tachycardia-bradycardia syndrome ([place])   2. Pacemaker   3. PAT (paroxysmal atrial tachycardia) ([place])   4. Aortic valve stenosis, nonrheumatic   5.  PAD (peripheral artery  disease) ([place])   6. Coronary artery disease of native artery of native heart with stable angina pectoris ([place])   7. Bilateral carotid artery stenosis   8. Mixed hyperlipidemia   9. DM (diabetes mellitus) type II uncontrolled, periph vascular disorder ([place])   10. Essential hypertension    PLAN:    In order of problems listed above:  1. Tachycardia-bradycardia syndrome: His blood pressure is a little high today after cutting back on the losartan.  Although he has not had any recent episodes of atrial tachycardia, he will benefit from a higher dose of metoprolol to suppress future tachyarrhythmia.  This may also allow [place] to take him off the losartan in case the hyperkalemia is a persistent problem. 2. PM: Surgical site well-healed, normal device function.  Rate response sensor turned on.  Lead outputs decreased to chronic settings today.  Remote downloads every 3 months. 3. PAT: Increase beta-blocker dose today.  (On his medicine list he has metoprolol 50 mg twice daily but in fact he's only been taking half that dose over the last several months).  We will go back to 50 mg twice daily. 4. AS: Mild to moderate, currently asymptomatic. 5. PAD: Denies claudication at this time. 6. CAD: Known occluded posterior lateral ventricular branch, currently exertional angina is controlled with beta-blockers and calcium channel blocker. 7. Bilateral moderate carotid artery stenosis: Scheduled by Dr. Fletcher Anon for a follow-up duplex ultrasound June 2022.  No neurological complaints. 8. HLP: Remarkable reduction in LDL cholesterol on PCSK9 inhibitor.  I wonder whether he still needs to take the statin as well.  Defer to Dr. Fletcher Anon. 9. DM: Most recent hemoglobin A1c was essentially unchanged and still not at target at 8.6%. 10. HTN: Dizziness has improved after pacemaker implantation.  Continues to have a relatively low diastolic blood pressure.  Off losartan so systolic blood pressures are high.   Increasing beta-blocker today.   Medication Adjustments/Labs and Tests Ordered: Current medicines are reviewed at length with the patient today.  Concerns regarding medicines are outlined above.  Orders Placed This Encounter  Procedures  . EKG 12-Lead   No orders of the defined types were placed in this encounter.   Patient Instructions  Medication Instructions:  No changes *If you need a refill on your cardiac medications before your next appointment, please call your pharmacy*   Lab Work: None ordered If you have labs (blood work) drawn today and your tests are completely normal, you will receive your results only by: [name] MyChart Message (if you have MyChart) OR . A paper copy in the mail If you have any lab test that is abnormal or we need to change your treatment, we will call you to review the results.   Testing/Procedures: None ordered   Follow-Up: At [facility], you and your health needs are our priority.  As part of our continuing mission to provide you with exceptional heart care, we have created designated Provider Care Teams.  These Care Teams include your primary Cardiologist (physician) and Advanced Practice Providers (APPs -  Physician Assistants and Nurse Practitioners) who all work together to provide you with the care you need, when you need it.  We recommend signing up for the patient portal called "MyChart".  Sign up information is provided on this After Visit Summary.  MyChart is used to connect with patients for Virtual Visits (Telemedicine).  Patients are able to view lab/test results, encounter notes, upcoming appointments, etc.  Non-urgent messages can be sent to your provider  as well.   To learn more about what you can do with MyChart, go to [email or web address].    Your next appointment:   12 month(s)  The format for your next appointment:   In Person  Provider:   Sanda Klein, MD       Signed, Sanda Klein, MD  01/18/2020 2:32 PM     [place]

## 2020-01-22 ENCOUNTER — Other Ambulatory Visit: Payer: Self-pay | Admitting: Physician Assistant

## 2020-01-22 DIAGNOSIS — K219 Gastro-esophageal reflux disease without esophagitis: Secondary | ICD-10-CM

## 2020-01-30 ENCOUNTER — Other Ambulatory Visit: Payer: Self-pay

## 2020-01-30 ENCOUNTER — Ambulatory Visit (INDEPENDENT_AMBULATORY_CARE_PROVIDER_SITE_OTHER): Payer: Medicare PPO | Admitting: Physician Assistant

## 2020-01-30 ENCOUNTER — Encounter: Payer: Self-pay | Admitting: Physician Assistant

## 2020-01-30 VITALS — BP 118/68 | HR 63 | Temp 98.2°F | Resp 14 | Ht 67.0 in | Wt 184.0 lb

## 2020-01-30 DIAGNOSIS — I10 Essential (primary) hypertension: Secondary | ICD-10-CM

## 2020-01-30 DIAGNOSIS — E875 Hyperkalemia: Secondary | ICD-10-CM

## 2020-01-30 DIAGNOSIS — F5101 Primary insomnia: Secondary | ICD-10-CM

## 2020-01-30 LAB — BASIC METABOLIC PANEL
BUN: 19 mg/dL (ref 6–23)
CO2: 31 mEq/L (ref 19–32)
Calcium: 9.4 mg/dL (ref 8.4–10.5)
Chloride: 101 mEq/L (ref 96–112)
Creatinine, Ser: 0.77 mg/dL (ref 0.40–1.50)
GFR: 84.32 mL/min (ref >60.00)
Glucose, Bld: 188 mg/dL — ABNORMAL HIGH (ref 70–99)
Potassium: 4.6 mEq/L (ref 3.5–5.1)
Sodium: 140 mEq/L (ref 135–145)

## 2020-01-30 MED ORDER — SUVOREXANT 10 MG PO TABS: 10.0000 mg | ORAL_TABLET | Freq: Every day | ORAL | 0 refills | Status: AC

## 2020-01-30 NOTE — Patient Instructions (Signed)
Please go to the lab today for blood work.  I will call you with your results. We will alter treatment regimen(s) if indicated by your results.   Please stay off of the losartan.   Start the B[place]ra at night as discussed.  Let me know if you have any issue picking up the medication.  Our first goal is to make sure you are tolerating well as expected and then we can adjust dose if needed. Follow-up 1 month.

## 2020-01-30 NOTE — Progress Notes (Signed)
Patient presents to clinic today for follow-up of of hyperkalemia and reassessment of blood pressure.  Since last visit patient's losartan was completely stopped due to persistent hyperkalemia.  Endorses stopping medication as directed, continue on his other medications daily.  States he feels better than he has in quite some time. Patient denies chest pain, palpitations, lightheadedness, dizziness, vision changes or frequent headaches.  Has been checking blood pressure at home periodically with systolic blood pressure ranging 419-3 79K and diastolic blood pressure ranging 60s to 70s.   Past Medical History:  Diagnosis Date  . 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 Arthritis    "lower back" (07/29/2016)  . ASCVD (arteriosclerotic cardiovascular disease)    -luminal irregularities in 1998 and 2005; normal EF  . Benign prostatic hypertrophy    status post transurethral resection of the prostate  . CAD (coronary artery disease)    a. Cath 07/29/16 99% ostial RCA & 70% pRCA s/p cutting balloon angioplasty and single SYNERGY DES 2.5X28. 100% very Distal 1st RPLB lesion --> the occlusion site moved further distal with guidewire advancement;  40% ostial LAD; 50% Ost Cx to Prox Cx lesion.  . Coronary artery disease   . Dysphagia   . Erectile dysfunction   . GERD (gastroesophageal reflux disease)   . Heart murmur   . History of hiatal hernia   . History of kidney stones   . Hyperlipidemia   . Hypertension   . Seasonal allergies   . Tobacco abuse    20 pack years; discontinued 1995  . Type II diabetes mellitus ([place])     Current Outpatient Medications on File Prior to Visit  Medication Sig Dispense Refill  . ACCU-CHEK AVIVA PLUS test strip     . alfuzosin (UROXATRAL) 10 MG 24 hr tablet Take 10 mg by mouth daily with breakfast.    . amLODipine (NORVASC) 5 MG tablet Take 1 tablet (5 mg total) by mouth daily. 90 tablet 1  . aspirin EC 81 MG tablet Take 81 mg by mouth at bedtime.     [name] atorvastatin  (LIPITOR) 10 MG tablet Take 1 tablet (10 mg total) by mouth once a week. (Patient taking differently: Take 10 mg by mouth every Wednesday. ) 90 tablet 1  . Cholecalciferol (VITAMIN D3) 50 MCG (2000 UT) TABS Take 2,000 Units by mouth daily.    . fluticasone furoate-vilanterol (BREO ELLIPTA) 100-25 MCG/INH AEPB Inhale 1 puff into the lungs daily. 180 each 2  . Insulin Pen Needle (PEN NEEDLES) 32G X 4 MM MISC To use daily with Lantus solostar. Dx E11.9 100 each 1  . LANTUS SOLOSTAR 100 UNIT/ML Solostar Pen Inject 30 Units into the skin at bedtime. 15 mL 2  . metFORMIN (GLUCOPHAGE) 1000 MG tablet Take 1 tablet (1,000 mg total) by mouth 2 (two) times daily with a meal. 180 tablet 1  . metoprolol tartrate (LOPRESSOR) 50 MG tablet Take 1 tablet (50 mg total) by mouth 2 (two) times daily. 180 tablet 3  . montelukast (SINGULAIR) 10 MG tablet Take 1 tablet (10 mg total) by mouth at bedtime. 30 tablet 11  . MULTIPLE VITAMIN PO Take 1 tablet by mouth daily.    . Multiple Vitamins-Minerals (HAIR SKIN AND NAILS FORMULA) TABS Take 1 tablet by mouth daily. Gummie    . nitroGLYCERIN (NITROSTAT) 0.4 MG SL tablet Place 1 tablet (0.4 mg total) under the tongue every 5 (five) minutes as needed for chest pain. 5 tablet 0  . Oxymetazoline HCl (NASAL SPRAY  NA) Place 1 spray into the nose See admin instructions. Alternate each Nasal at bedtime    . pantoprazole (PROTONIX) 40 MG tablet TAKE 1 TABLET(40 MG) BY MOUTH DAILY 90 tablet 1  . REPATHA SURECLICK 852 MG/ML SOAJ ADMINISTER 1 ML UNDER THE SKIN EVERY 14 DAYS 2 mL 12  . sitaGLIPtin (JANUVIA) 100 MG tablet Take 1 tablet (100 mg total) by mouth daily. 90 tablet 1  . tadalafil (CIALIS) 20 MG tablet Take 0.5-1 tablets (10-20 mg total) by mouth every other day as needed for erectile dysfunction. 5 tablet 1  . vitamin C (ASCORBIC ACID) 250 MG tablet Take 500 mg by mouth daily. Gummie     No current facility-administered medications on file prior to visit.    Allergies   Allergen Reactions  . Plavix [Clopidogrel Bisulfate] Shortness Of Breath  . Oxycontin [Oxycodone Hcl] Other (See Comments)    Malaise  . Penicillins Other (See Comments)    Cotton Mouth   . Pioglitazone Other (See Comments)    Made his chest hurt  . Prednisone Other (See Comments)    Reaction: muscle aches and soreness  . Simvastatin Other (See Comments)    Myalgias    Family History  Problem Relation Age of Onset  . Ovarian cancer Mother   . Colon cancer Sister 27       deceased from colon cancer    Social History   Socioeconomic History  . Marital status: Married    Spouse name: Not on file  . Number of children: 2  . Years of education: Not on file  . Highest education level: Not on file  Occupational History  . Occupation: Dietitian, 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    Comment: [employer]  Tobacco Use  . Smoking status: Former Smoker    Packs/day: 1.00    Years: 15.00    Pack years: 15.00    Types: Cigarettes    Start date: 03/08/1965    Quit date: 07/07/1993    Years since quitting: 26.5  . Smokeless tobacco: Never Used  Vaping Use  . Vaping Use: Never used  Substance and Sexual Activity  . Alcohol use: No    Alcohol/week: 0.0 standard drinks    Comment: 07/29/2016 "last drink was 4 wks ago; had a beer"  . Drug use: No  . Sexual activity: Not Currently  Other Topics Concern  . Not on file  Social History Narrative   5 grandchildren    Social Determinants of Health   Financial Resource Strain:   . Difficulty of Paying Living Expenses: Not on file  Food Insecurity:   . Worried About Charity fundraiser in the Last Year: Not on file  . Ran Out of Food in the Last Year: Not on file  Transportation Needs:   . Lack of Transportation (Medical): Not on file  . Lack of Transportation (Non-Medical): Not on file  Physical Activity:   . Days of Exercise per Week: Not on file  . Minutes of Exercise per Session: Not on file  Stress:   . Feeling  of Stress : Not on file  Social Connections:   . Frequency of Communication with Friends and Family: Not on file  . Frequency of Social Gatherings with Friends and Family: Not on file  . Attends Religious Services: Not on file  . Active Member of Clubs or Organizations: Not on file  . Attends Archivist Meetings: Not on file  . Marital Status: Not on  file    Review of Systems - See HPI.  All other ROS are negative.  BP 118/68   Pulse 63   Temp 98.2 F (36.8 C) (Temporal)   Resp 14   Ht '5\' 7"'  (1.702 m)   Wt 184 lb (83.5 kg)   SpO2 97%   BMI 28.82 kg/m   Physical Exam Vitals reviewed.  Constitutional:      Appearance: Normal appearance.  HENT:     Head: Normocephalic and atraumatic.  Cardiovascular:     Rate and Rhythm: Normal rate and regular rhythm.  Musculoskeletal:     Cervical back: Neck supple.  Neurological:     General: No focal deficit present.     Mental Status: He is alert and oriented to person, place, and time.  Psychiatric:        Mood and Affect: Mood normal.     Recent Results (from the past 2160 hour(s))  Lipid Profile     Status: Abnormal   Collection Time: 12/20/19  9:03 AM  Result Value Ref Range   Cholesterol 57 0 - 200 mg/dL    Comment: ATP III Classification       Desirable:  < 200 mg/dL               Borderline High:  200 - 239 mg/dL          High:  > = 240 mg/dL   Triglycerides 50.0 0 - 149 mg/dL    Comment: Normal:  <150 mg/dLBorderline High:  150 - 199 mg/dL   HDL 34.50 (L) >39.00 mg/dL   VLDL 10.0 0.0 - 40.0 mg/dL   LDL Cholesterol 12 0 - 99 mg/dL   Total CHOL/HDL Ratio 2     Comment:                Men          Women1/2 Average Risk     3.4          3.3Average Risk          5.0          4.42X Average Risk          9.6          7.13X Average Risk          15.0          11.0                       NonHDL 22.31     Comment: NOTE:  Non-HDL goal should be 30 mg/dL higher than patient's LDL goal (i.e. LDL goal of < 70 mg/dL, would  have non-HDL goal of < 100 mg/dL)  Comp Met (CMET)     Status: Abnormal   Collection Time: 12/20/19  9:03 AM  Result Value Ref Range   Sodium 139 135 - 145 mEq/L   Potassium 5.3 No hemolysis seen (H) 3.5 - 5.1 mEq/L   Chloride 98 96 - 112 mEq/L   CO2 32 19 - 32 mEq/L   Glucose, Bld 143 (H) 70 - 99 mg/dL   BUN 19 6 - 23 mg/dL   Creatinine, Ser 0.82 0.40 - 1.50 mg/dL   Total Bilirubin 0.9 0.2 - 1.2 mg/dL   Alkaline Phosphatase 57 39 - 117 U/L   AST 16 0 - 37 U/L   ALT 27 0 - 53 U/L   Total Protein 6.4 6.0 - 8.3 g/dL   Albumin 4.4  3.5 - 5.2 g/dL   GFR 82.80 >60.00 mL/min    Comment: Calculated using the CKD-EPI Creatinine Equation (2021)   Calcium 9.3 8.4 - 10.5 mg/dL  Hemoglobin A1c     Status: Abnormal   Collection Time: 12/20/19  9:03 AM  Result Value Ref Range   Hgb A1c MFr Bld 8.6 (H) 4.6 - 6.5 %    Comment: Glycemic Control Guidelines for People with Diabetes:Non Diabetic:  <6%Goal of Therapy: <7%Additional Action Suggested:  >1%   Basic Metabolic Panel (BMET)     Status: Abnormal   Collection Time: 01/14/20  9:17 AM  Result Value Ref Range   Sodium 137 135 - 145 mEq/L   Potassium 5.3 No hemolysis seen (H) 3.5 - 5.1 mEq/L   Chloride 99 96 - 112 mEq/L   CO2 31 19 - 32 mEq/L   Glucose, Bld 146 (H) 70 - 99 mg/dL   BUN 26 (H) 6 - 23 mg/dL   Creatinine, Ser 0.91 0.40 - 1.50 mg/dL   GFR 79.41 >60.00 mL/min    Comment: Calculated using the CKD-EPI Creatinine Equation (2021)   Calcium 9.8 8.4 - 10.5 mg/dL  CUP PACEART REMOTE DEVICE CHECK     Status: None   Collection Time: 01/15/20  4:00 AM  Result Value Ref Range   Date Time Interrogation Session [phone]    Pulse Generator Manufacturer [employer]    Pulse Gen Model 2272 Assurity MRI    Pulse Gen Serial Number [id number]    Clinic Name [facility]    Implantable Pulse Generator Type Implantable Pulse Generator    Implantable Pulse Generator Implant Date [id number]    Implantable Lead Manufacturer [facility]    Implantable Lead  Model LPA1200M Tendril MRI    Implantable Lead Serial Number [id number]    Implantable Lead Implant Date 41423953    Implantable Lead Location Detail 1 UNKNOWN    Implantable Lead Location [id number]    Implantable Lead Manufacturer [facility]    Implantable Lead Model LPA1200M Tendril MRI    Implantable Lead Serial Number [id number]    Implantable Lead Implant Date 20233435    Implantable Lead Location Detail 1 UNKNOWN    Implantable Lead Location [id number]    Lead Channel Setting Sensing Sensitivity 2.0 mV   Lead Channel Setting Sensing Adaptation Mode Fixed Pacing    Lead Channel Setting Pacing Amplitude 3.5 V   Lead Channel Setting Pacing Pulse Width 0.5 ms   Lead Channel Setting Pacing Amplitude 3.5 V   Lead Channel Status NULL    Lead Channel Impedance Value 410 ohm   Lead Channel Sensing Intrinsic Amplitude 2.9 mV   Lead Channel Pacing Threshold Amplitude 1.0 V   Lead Channel Pacing Threshold Pulse Width 0.5 ms   Lead Channel Status NULL    Lead Channel Impedance Value 650 ohm   Lead Channel Sensing Intrinsic Amplitude 12.0 mV   Lead Channel Pacing Threshold Amplitude 0.75 V   Lead Channel Pacing Threshold Pulse Width 0.5 ms   Battery Status MOS    Battery Remaining Longevity 89 mo   Battery Remaining Percentage 95.5 %   Battery Voltage 3.04 V   Brady Statistic RA Percent Paced 28.0 %   Brady Statistic RV Percent Paced 1.0 %   Brady Statistic AP VP Percent 1.0 %   Brady Statistic AS VP Percent 0 %   Brady Statistic AP VS Percent 30.0 %   Brady Statistic AS VS Percent 70.0 %    Assessment/Plan: 1. Hyperkalemia Repeat BMP  today now that he is completely off of ARB.  Dietary recommendations again discussed with patient. - Basic metabolic panel  2. Essential hypertension BP normotensive and asymptomatic.  We will plan to stay off of losartan.  3. Primary insomnia Mention at the end of the visit.  Patient with ongoing history.  Was tried on temazepam by his pulmonologist but could  not tolerate the medicine well.  Sleep hygiene practices discussed with patient.  Will initiate trial of Belsomra 10 mg nightly.  Follow-up scheduled. - Suvorexant 10 MG TABS; Take 10 mg by mouth at bedtime.  Dispense: 30 tablet; Refill: 0  This visit occurred during the SARS-CoV-2 public health emergency.  Safety protocols were in place, including screening questions prior to the visit, additional usage of staff PPE, and extensive cleaning of exam room while observing appropriate contact time as indicated for disinfecting solutions.     Leeanne Rio, PA-C

## 2020-01-31 ENCOUNTER — Other Ambulatory Visit: Payer: Self-pay | Admitting: Physician Assistant

## 2020-01-31 DIAGNOSIS — E119 Type 2 diabetes mellitus without complications: Secondary | ICD-10-CM

## 2020-02-05 ENCOUNTER — Ambulatory Visit: Payer: Medicare PPO | Admitting: Endocrinology

## 2020-02-05 ENCOUNTER — Other Ambulatory Visit: Payer: Self-pay

## 2020-02-05 ENCOUNTER — Encounter: Payer: Self-pay | Admitting: Endocrinology

## 2020-02-05 VITALS — BP 142/88 | HR 68 | Ht 67.0 in | Wt 185.0 lb

## 2020-02-05 DIAGNOSIS — E1151 Type 2 diabetes mellitus with diabetic peripheral angiopathy without gangrene: Secondary | ICD-10-CM

## 2020-02-05 DIAGNOSIS — E1165 Type 2 diabetes mellitus with hyperglycemia: Secondary | ICD-10-CM | POA: Diagnosis not present

## 2020-02-05 DIAGNOSIS — IMO0002 Reserved for concepts with insufficient information to code with codable children: Secondary | ICD-10-CM

## 2020-02-05 DIAGNOSIS — E119 Type 2 diabetes mellitus without complications: Secondary | ICD-10-CM

## 2020-02-05 MED ORDER — METFORMIN HCL ER 500 MG PO TB24: 2000.0000 mg | ORAL_TABLET | Freq: Every day | ORAL | 3 refills | Status: DC

## 2020-02-05 MED ORDER — LANTUS SOLOSTAR 100 UNIT/ML ~~LOC~~ SOPN: 30.0000 [IU] | PEN_INJECTOR | SUBCUTANEOUS | 2 refills | Status: DC

## 2020-02-05 MED ORDER — RYBELSUS 7 MG PO TABS: 7.0000 mg | ORAL_TABLET | Freq: Every day | ORAL | 3 refills | Status: DC

## 2020-02-05 NOTE — Patient Instructions (Addendum)
good diet and exercise significantly improve the control of your diabetes.  please let me know if you wish to be referred to a dietician.  high blood sugar is very risky to your health.  you should see an eye doctor and dentist every year.  It is very important to get all recommended vaccinations.  Controlling your blood pressure and cholesterol drastically reduces the damage diabetes does to your body.  Those who smoke should quit.  Please discuss these with your doctor.  check your blood sugar twice a day.  vary the time of day when you check, between before the 3 meals, and at bedtime.  also check if you have symptoms of your blood sugar being too high or too low.  please keep a record of the readings and bring it to your next appointment here (or you can bring the meter itself).  You can write it on any piece of paper.  please call us sooner if your blood sugar goes below 70, or if you have a lot of readings over 200.   We will need to take this complex situation in stages For now, please: Change the Lantus to the morning, and: I have sent 2 prescriptions to your pharmacy: to change the Januvia to Rybelsus, and to change the metformin to extended-release.   Please come back for a follow-up appointment in 6 weeks.

## 2020-02-05 NOTE — Progress Notes (Signed)
Subjective:    Patient ID: Marcus Norton, male    DOB: 1940-12-18, 79 y.o.   MRN: [id number]  HPI pt is referred by [name], PA, for diabetes.  Pt states DM was dx'ed in 8341; it is complicated by DR, CAD, and PAD; he has been on insulin since 2009; pt says his diet and exercise are good; he has never had pancreatitis, pancreatic surgery, severe hypoglycemia or DKA.  Pt says he is here for DM, not dyslipidemia.  He says cbg varies from 110-250.   Past Medical History:  Diagnosis Date  . 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 Arthritis    "lower back" (07/29/2016)  . ASCVD (arteriosclerotic cardiovascular disease)    -luminal irregularities in 1998 and 2005; normal EF  . Benign prostatic hypertrophy    status post transurethral resection of the prostate  . CAD (coronary artery disease)    a. Cath 07/29/16 99% ostial RCA & 70% pRCA s/p cutting balloon angioplasty and single SYNERGY DES 2.5X28. 100% very Distal 1st RPLB lesion --> the occlusion site moved further distal with guidewire advancement;  40% ostial LAD; 50% Ost Cx to Prox Cx lesion.  . Coronary artery disease   . Dysphagia   . Erectile dysfunction   . GERD (gastroesophageal reflux disease)   . Heart murmur   . History of hiatal hernia   . History of kidney stones   . Hyperlipidemia   . Hypertension   . Seasonal allergies   . Tobacco abuse    20 pack years; discontinued 1995  . Type II diabetes mellitus ([place])     Past Surgical History:  Procedure Laterality Date  . APPENDECTOMY    . CATARACT EXTRACTION W/ INTRAOCULAR LENS  IMPLANT, BILATERAL Bilateral   . COLONOSCOPY  07/20/2011   Dr. Gala Romney: multiple hyperplastic polyps. Surveillance 2018  . COLONOSCOPY N/A 06/30/2016   Procedure: COLONOSCOPY;  Surgeon: Daneil Dolin, MD;  Location[facility];  Service: Endoscopy;  Laterality: N/A;  10:00am  . COLONOSCOPY W/ BIOPSIES AND POLYPECTOMY  07/08/08, 06/2011   Dr Madolyn Frieze papilla, diminutive rectal polyp ablated  the, left-sided diverticula, piecemeal polypectomy of hepatic flexure adenomatous polyp, diminutive polyps near hepatic flexure ablated  . CORONARY ANGIOPLASTY WITH STENT PLACEMENT  07/29/2016  . CORONARY STENT INTERVENTION N/A 07/29/2016   Procedure: Coronary Stent Intervention;  Surgeon: Leonie Man, MD;  Location: [place] [facility];  Service: Cardiovascular;  Laterality: N/A;  RCA  . ESOPHAGOGASTRODUODENOSCOPY  2013   erosive reflux esophagitis, s/p empiric dilation. chronic duodenitis.   [name] LEFT HEART CATH AND CORONARY ANGIOGRAPHY N/A 07/29/2016   Procedure: Left Heart Cath and Coronary Angiography;  Surgeon: Leonie Man, MD;  Location: [place] [facility];  Service: Cardiovascular;  Laterality: N/A;  . NASAL SEPTUM SURGERY    . NASAL SINUS SURGERY     "cleaned out my sinus"  . PACEMAKER IMPLANT N/A 10/15/2019   Procedure: PACEMAKER IMPLANT;  Surgeon: Sanda Klein, MD;  Location: [place] [facility];  Service: Cardiovascular;  Laterality: N/A;  . PERIPHERAL VASCULAR CATHETERIZATION N/A 11/26/2015   Procedure: Abdominal Aortogram w/Lower Extremity;  Surgeon: Wellington Hampshire, MD;  Location: [place] [facility];  Service: Cardiovascular;  Laterality: N/A;  . TONSILLECTOMY    . TRANSURETHRAL RESECTION OF PROSTATE  2009    Social History   Socioeconomic History  . Marital status: Married    Spouse name: Not on file  . Number of children: 2  . Years of  education: Not on file  . Highest education level: Not on file  Occupational History  . Occupation: Dietitian, 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    Comment: [employer]  Tobacco Use  . Smoking status: Former Smoker    Packs/day: 1.00    Years: 15.00    Pack years: 15.00    Types: Cigarettes    Start date: 03/08/1965    Quit date: 07/07/1993    Years since quitting: 26.6  . Smokeless tobacco: Never Used  Vaping Use  . Vaping Use: Never used  Substance and Sexual Activity  . Alcohol use: No    Alcohol/week: 0.0  standard drinks    Comment: 07/29/2016 "last drink was 4 wks ago; had a beer"  . Drug use: No  . Sexual activity: Not Currently  Other Topics Concern  . Not on file  Social History Narrative   5 grandchildren    Social Determinants of Health   Financial Resource Strain: Not on file  Food Insecurity: Not on file  Transportation Needs: Not on file  Physical Activity: Not on file  Stress: Not on file  Social Connections: Not on file  Intimate Partner Violence: Not on file    Current Outpatient Medications on File Prior to Visit  Medication Sig Dispense Refill  . ACCU-CHEK AVIVA PLUS test strip     . alfuzosin (UROXATRAL) 10 MG 24 hr tablet Take 10 mg by mouth daily with breakfast.    . amLODipine (NORVASC) 5 MG tablet Take 1 tablet (5 mg total) by mouth daily. 90 tablet 1  . aspirin EC 81 MG tablet Take 81 mg by mouth at bedtime.     [name] atorvastatin (LIPITOR) 10 MG tablet Take 1 tablet (10 mg total) by mouth once a week. (Patient taking differently: Take 10 mg by mouth every Wednesday.) 90 tablet 1  . Cholecalciferol (VITAMIN D3) 50 MCG (2000 UT) TABS Take 2,000 Units by mouth daily.    . fluticasone furoate-vilanterol (BREO ELLIPTA) 100-25 MCG/INH AEPB Inhale 1 puff into the lungs daily. 180 each 2  . Insulin Pen Needle (PEN NEEDLES) 32G X 4 MM MISC To use daily with Lantus solostar. Dx E11.9 100 each 1  . metoprolol tartrate (LOPRESSOR) 50 MG tablet Take 1 tablet (50 mg total) by mouth 2 (two) times daily. 180 tablet 3  . montelukast (SINGULAIR) 10 MG tablet Take 1 tablet (10 mg total) by mouth at bedtime. 30 tablet 11  . MULTIPLE VITAMIN PO Take 1 tablet by mouth daily.    . Multiple Vitamins-Minerals (HAIR SKIN AND NAILS FORMULA) TABS Take 1 tablet by mouth daily. Gummie    . nitroGLYCERIN (NITROSTAT) 0.4 MG SL tablet Place 1 tablet (0.4 mg total) under the tongue every 5 (five) minutes as needed for chest pain. 5 tablet 0  . pantoprazole (PROTONIX) 40 MG tablet TAKE 1 TABLET(40 MG)  BY MOUTH DAILY 90 tablet 1  . REPATHA SURECLICK 952 MG/ML SOAJ ADMINISTER 1 ML UNDER THE SKIN EVERY 14 DAYS 2 mL 12  . sitaGLIPtin (JANUVIA) 100 MG tablet Take 1 tablet (100 mg total) by mouth daily. 90 tablet 1  . Suvorexant 10 MG TABS Take 10 mg by mouth at bedtime. 30 tablet 0  . tadalafil (CIALIS) 20 MG tablet Take 0.5-1 tablets (10-20 mg total) by mouth every other day as needed for erectile dysfunction. 5 tablet 1  . vitamin C (ASCORBIC ACID) 250 MG tablet Take 500 mg by mouth daily. Gummie     No  current facility-administered medications on file prior to visit.    Allergies  Allergen Reactions  . Plavix [Clopidogrel Bisulfate] Shortness Of Breath  . Oxycontin [Oxycodone Hcl] Other (See Comments)    Malaise  . Penicillins Other (See Comments)    Cotton Mouth   . Pioglitazone Other (See Comments)    Made his chest hurt  . Prednisone Other (See Comments)    Reaction: muscle aches and soreness  . Simvastatin Other (See Comments)    Myalgias    Family History  Problem Relation Age of Onset  . Ovarian cancer Mother   . Diabetes Brother   . Colon cancer Sister 78       deceased from colon cancer  . Diabetes Maternal Grandmother     BP (!) 142/88   Pulse 68   Ht 5\' 7"  (1.702 m)   Wt 185 lb (83.9 kg)   SpO2 98%   BMI 28.98 kg/m   Review of Systems denies blurry vision, chest pain, sob, n/v, urinary frequency, and depression.  He has slight weight gain.  He has mild memory loss.      Objective:   Physical Exam VITAL SIGNS:  See vs page.   GENERAL: no distress Pulses: dorsalis pedis intact bilat.   MSK: no deformity of the feet CV: 2+ bilat leg edema Skin:  no ulcer on the feet.  normal color and temp on the feet.   Neuro: sensation is intact to touch on the feet.    Lab Results  Component Value Date   CREATININE 0.77 01/30/2020   BUN 19 01/30/2020   NA 140 01/30/2020   K 4.6 01/30/2020   CL 101 01/30/2020   CO2 31 01/30/2020   Lab Results  Component  Value Date   HGBA1C 8.6 (H) 12/20/2019   I have reviewed outside records, and summarized: Pt was noted to have elevated A1c, and referred here.  Dyslipidemia is managed by cardiol.        Assessment & Plan:  Insulin-requiring type 2 DM, with PAD: uncontrolled.   Patient Instructions  good diet and exercise significantly improve the control of your diabetes.  please let me know if you wish to be referred to a dietician.  high blood sugar is very risky to your health.  you should see an eye doctor and dentist every year.  It is very important to get all recommended vaccinations.  Controlling your blood pressure and cholesterol drastically reduces the damage diabetes does to your body.  Those who smoke should quit.  Please discuss these with your doctor.  check your blood sugar twice a day.  vary the time of day when you check, between before the 3 meals, and at bedtime.  also check if you have symptoms of your blood sugar being too high or too low.  please keep a record of the readings and bring it to your next appointment here (or you can bring the meter itself).  You can write it on any piece of paper.  please call us sooner if your blood sugar goes below 70, or if you have a lot of readings over 200.   We will need to take this complex situation in stages For now, please: Change the Lantus to the morning, and: I have sent 2 prescriptions to your pharmacy: to change the Januvia to Rybelsus, and to change the metformin to extended-release.   Please come back for a follow-up appointment in 6 weeks.

## 2020-02-23 DIAGNOSIS — Z20822 Contact with and (suspected) exposure to covid-19: Secondary | ICD-10-CM | POA: Diagnosis not present

## 2020-02-23 DIAGNOSIS — Z03818 Encounter for observation for suspected exposure to other biological agents ruled out: Secondary | ICD-10-CM | POA: Diagnosis not present

## 2020-02-23 DIAGNOSIS — J0100 Acute maxillary sinusitis, unspecified: Secondary | ICD-10-CM | POA: Diagnosis not present

## 2020-02-23 DIAGNOSIS — Z20828 Contact with and (suspected) exposure to other viral communicable diseases: Secondary | ICD-10-CM | POA: Diagnosis not present

## 2020-02-23 DIAGNOSIS — U071 COVID-19: Secondary | ICD-10-CM | POA: Diagnosis not present

## 2020-02-25 ENCOUNTER — Telehealth: Payer: Self-pay | Admitting: Physician Assistant

## 2020-02-25 NOTE — Telephone Encounter (Signed)
Need to know where he was tested and symptom onset so his information can be sent to the infusion center.  They are prioritizing those who are unvaccinated with multiple risks so the sooner we can get his info to the center the better. If 7+ days since symptom onset he is no longer a candidate.

## 2020-02-25 NOTE — Telephone Encounter (Signed)
Patient tested positive for covid yesterday.  He would like to know if he is eligible for the anitbody treatment, please advise

## 2020-02-25 NOTE — Telephone Encounter (Signed)
Cold symptoms started 1 week ago on Monday. He was negative for flu but positive for covid.  Went to get tested on Saturday AM at [facility] Current symptoms, upset stomach, tickle in throat, cough up clear sputum, no fever. Taking Zyrtec, cough syrup.  He was advised if his symptoms get worst to go to the ER. He is agreeable.

## 2020-03-18 ENCOUNTER — Telehealth: Payer: Self-pay | Admitting: Cardiovascular Disease

## 2020-03-18 NOTE — Telephone Encounter (Signed)
Pt c/o medication issue:  1. Name of Medication: REPATHA SURECLICK 563 MG/ML SOAJ  2. How are you currently taking this medication (dosage and times per day)? 1 injection every 14 days  3. Are you having a reaction (difficulty breathing--STAT)? no  4. What is your medication issue? Patient's wife states they received a letter stating the medication requires a prior authorization.

## 2020-03-18 NOTE — Telephone Encounter (Signed)
Called pt's wife and old her I would have to complete pa by phone. Voiced understanding

## 2020-03-21 ENCOUNTER — Other Ambulatory Visit: Payer: Self-pay

## 2020-03-24 ENCOUNTER — Other Ambulatory Visit: Payer: Self-pay

## 2020-03-24 ENCOUNTER — Ambulatory Visit (INDEPENDENT_AMBULATORY_CARE_PROVIDER_SITE_OTHER): Payer: Medicare PPO

## 2020-03-24 VITALS — BP 148/90 | HR 75 | Temp 97.9°F | Resp 16 | Ht 67.0 in | Wt 178.2 lb

## 2020-03-24 DIAGNOSIS — Z0000 Encounter for general adult medical examination without abnormal findings: Secondary | ICD-10-CM | POA: Diagnosis not present

## 2020-03-24 NOTE — Patient Instructions (Signed)
[name] , Thank you for taking time to come for your Medicare Wellness Visit. I appreciate your ongoing commitment to your health goals. Please review the following plan we discussed and let me know if I can assist you in the future.   Screening recommendations/referrals: Colonoscopy: No longer required Recommended yearly ophthalmology/optometry visit for glaucoma screening and checkup Recommended yearly dental visit for hygiene and checkup  Vaccinations: Influenza vaccine: Up to date Pneumococcal vaccine: Completed vaccines Tdap vaccine: Up to date-Due-03/10/2021 Shingles vaccine: Completed vaccines   Covid-19: Completed vaccines  Advanced directives: Copy in chart  Conditions/risks identified: See problem list  Next appointment: Follow up in one year for your annual wellness visit.   Preventive Care 60 Years and Older, Male Preventive care refers to lifestyle choices and visits with your health care provider that can promote health and wellness. What does preventive care include?  A yearly physical exam. This is also called an annual well check.  Dental exams once or twice a year.  Routine eye exams. Ask your health care provider how often you should have your eyes checked.  Personal lifestyle choices, including:  Daily care of your teeth and gums.  Regular physical activity.  Eating a healthy diet.  Avoiding tobacco and drug use.  Limiting alcohol use.  Practicing safe sex.  Taking low doses of aspirin every day.  Taking vitamin and mineral supplements as recommended by your health care provider. What happens during an annual well check? The services and screenings done by your health care provider during your annual well check will depend on your age, overall health, lifestyle risk factors, and family history of disease. Counseling  Your health care provider may ask you questions about your:  Alcohol use.  Tobacco use.  Drug use.  Emotional  well-being.  Home and relationship well-being.  Sexual activity.  Eating habits.  History of falls.  Memory and ability to understand (cognition).  Work and work Statistician. Screening  You may have the following tests or measurements:  Height, weight, and BMI.  Blood pressure.  Lipid and cholesterol levels. These may be checked every 5 years, or more frequently if you are over 53 years old.  Skin check.  Lung cancer screening. You may have this screening every year starting at age 57 if you have a 30-pack-year history of smoking and currently smoke or have quit within the past 15 years.  Fecal occult blood test (FOBT) of the stool. You may have this test every year starting at age 26.  Flexible sigmoidoscopy or colonoscopy. You may have a sigmoidoscopy every 5 years or a colonoscopy every 10 years starting at age 16.  Prostate cancer screening. Recommendations will vary depending on your family history and other risks.  Hepatitis C blood test.  Hepatitis B blood test.  Sexually transmitted disease (STD) testing.  Diabetes screening. This is done by checking your blood sugar (glucose) after you have not eaten for a while (fasting). You may have this done every 1-3 years.  Abdominal aortic aneurysm (AAA) screening. You may need this if you are a current or former smoker.  Osteoporosis. You may be screened starting at age 44 if you are at high risk. Talk with your health care provider about your test results, treatment options, and if necessary, the need for more tests. Vaccines  Your health care provider may recommend certain vaccines, such as:  Influenza vaccine. This is recommended every year.  Tetanus, diphtheria, and acellular pertussis (Tdap, Td) vaccine. You may  need a Td booster every 10 years.  Zoster vaccine. You may need this after age 16.  Pneumococcal 13-valent conjugate (PCV13) vaccine. One dose is recommended after age 29.  Pneumococcal  polysaccharide (PPSV23) vaccine. One dose is recommended after age 15. Talk to your health care provider about which screenings and vaccines you need and how often you need them. This information is not intended to replace advice given to you by your health care provider. Make sure you discuss any questions you have with your health care provider. Document Released: 03/07/2015 Document Revised: 10/29/2015 Document Reviewed: 12/10/2014 Elsevier Interactive Patient Education  2017 [place] Prevention in the Home Falls can cause injuries. They can happen to people of all ages. There are many things you can do to make your home safe and to help prevent falls. What can I do on the outside of my home?  Regularly fix the edges of walkways and driveways and fix any cracks.  Remove anything that might make you trip as you walk through a door, such as a raised step or threshold.  Trim any bushes or trees on the path to your home.  Use bright outdoor lighting.  Clear any walking paths of anything that might make someone trip, such as rocks or tools.  Regularly check to see if handrails are loose or broken. Make sure that both sides of any steps have handrails.  Any raised decks and porches should have guardrails on the edges.  Have any leaves, snow, or ice cleared regularly.  Use sand or salt on walking paths during winter.  Clean up any spills in your garage right away. This includes oil or grease spills. What can I do in the bathroom?  Use night lights.  Install grab bars by the toilet and in the tub and shower. Do not use towel bars as grab bars.  Use non-skid mats or decals in the tub or shower.  If you need to sit down in the shower, use a plastic, non-slip stool.  Keep the floor dry. Clean up any water that spills on the floor as soon as it happens.  Remove soap buildup in the tub or shower regularly.  Attach bath mats securely with double-sided non-slip rug  tape.  Do not have throw rugs and other things on the floor that can make you trip. What can I do in the bedroom?  Use night lights.  Make sure that you have a light by your bed that is easy to reach.  Do not use any sheets or blankets that are too big for your bed. They should not hang down onto the floor.  Have a firm chair that has side arms. You can use this for support while you get dressed.  Do not have throw rugs and other things on the floor that can make you trip. What can I do in the kitchen?  Clean up any spills right away.  Avoid walking on wet floors.  Keep items that you use a lot in easy-to-reach places.  If you need to reach something above you, use a strong step stool that has a grab bar.  Keep electrical cords out of the way.  Do not use floor polish or wax that makes floors slippery. If you must use wax, use non-skid floor wax.  Do not have throw rugs and other things on the floor that can make you trip. What can I do with my stairs?  Do not leave any items on  the stairs.  Make sure that there are handrails on both sides of the stairs and use them. Fix handrails that are broken or loose. Make sure that handrails are as long as the stairways.  Check any carpeting to make sure that it is firmly attached to the stairs. Fix any carpet that is loose or worn.  Avoid having throw rugs at the top or bottom of the stairs. If you do have throw rugs, attach them to the floor with carpet tape.  Make sure that you have a light switch at the top of the stairs and the bottom of the stairs. If you do not have them, ask someone to add them for you. What else can I do to help prevent falls?  Wear shoes that:  Do not have high heels.  Have rubber bottoms.  Are comfortable and fit you well.  Are closed at the toe. Do not wear sandals.  If you use a stepladder:  Make sure that it is fully opened. Do not climb a closed stepladder.  Make sure that both sides of the  stepladder are locked into place.  Ask someone to hold it for you, if possible.  Clearly mark and make sure that you can see:  Any grab bars or handrails.  First and last steps.  Where the edge of each step is.  Use tools that help you move around (mobility aids) if they are needed. These include:  Canes.  Walkers.  Scooters.  Crutches.  Turn on the lights when you go into a dark area. Replace any light bulbs as soon as they burn out.  Set up your furniture so you have a clear path. Avoid moving your furniture around.  If any of your floors are uneven, fix them.  If there are any pets around you, be aware of where they are.  Review your medicines with your doctor. Some medicines can make you feel dizzy. This can increase your chance of falling. Ask your doctor what other things that you can do to help prevent falls. This information is not intended to replace advice given to you by your health care provider. Make sure you discuss any questions you have with your health care provider. Document Released: 12/05/2008 Document Revised: 07/17/2015 Document Reviewed: 03/15/2014 Elsevier Interactive Patient Education  2017 [employer].

## 2020-03-24 NOTE — Progress Notes (Signed)
Subjective:   Marcus Norton is a 80 y.o. male who presents for Medicare Annual/Subsequent preventive examination.  Review of Systems     Cardiac Risk Factors include: advanced age (>56men, >51 women);male gender;diabetes mellitus;dyslipidemia;hypertension     Objective:    Today's Vitals   03/24/20 0937  BP: (!) 148/90  Pulse: 75  Resp: 16  Temp: 97.9 F (36.6 C)  TempSrc: Oral  SpO2: 95%  Weight: 178 lb 3.2 oz (80.8 kg)  Height: 5\' 7"  (1.702 m)   Body mass index is 27.91 kg/m.  Advanced Directives 03/24/2020 10/15/2019 08/03/2019 03/14/2019 02/04/2018 03/08/2017 10/01/2016  Does Patient Have a Medical Advance Directive? Yes No No No No No No  Type of Paramedic of [place];Living will - - - - - -  Does patient want to make changes to medical advance directive? - - - - - - -  Copy of [place] in Chart? Yes - validated most recent copy scanned in chart (See row information) - - - - - -  Would patient like information on creating a medical advance directive? - Yes (MAU/Ambulatory/Procedural Areas - Information given) - Yes (MAU/Ambulatory/Procedural Areas - Information given) No - Patient declined Yes (MAU/Ambulatory/Procedural Areas - Information given) No - Patient declined  Pre-existing out of facility DNR order (yellow form or pink MOST form) - - - - - - -    Current Medications (verified) Outpatient Encounter Medications as of 03/24/2020  Medication Sig  . ACCU-CHEK AVIVA PLUS test strip   . alfuzosin (UROXATRAL) 10 MG 24 hr tablet Take 10 mg by mouth daily with breakfast.  . amLODipine (NORVASC) 5 MG tablet Take 1 tablet (5 mg total) by mouth daily.  [name] aspirin EC 81 MG tablet Take 81 mg by mouth at bedtime.   [name] atorvastatin (LIPITOR) 10 MG tablet Take 1 tablet (10 mg total) by mouth once a week. (Patient taking differently: Take 10 mg by mouth every Wednesday.)  . Cholecalciferol (VITAMIN D3) 50 MCG (2000 UT) TABS Take 2,000  Units by mouth daily.  . fluticasone furoate-vilanterol (BREO ELLIPTA) 100-25 MCG/INH AEPB Inhale 1 puff into the lungs daily.  . Insulin Pen Needle (PEN NEEDLES) 32G X 4 MM MISC To use daily with Lantus solostar. Dx E11.9  . LANTUS SOLOSTAR 100 UNIT/ML Solostar Pen Inject 30 Units into the skin every morning.  . metFORMIN (GLUCOPHAGE-XR) 500 MG 24 hr tablet Take 4 tablets (2,000 mg total) by mouth daily.  . metoprolol tartrate (LOPRESSOR) 50 MG tablet Take 1 tablet (50 mg total) by mouth 2 (two) times daily.  . MULTIPLE VITAMIN PO Take 1 tablet by mouth daily.  . nitroGLYCERIN (NITROSTAT) 0.4 MG SL tablet Place 1 tablet (0.4 mg total) under the tongue every 5 (five) minutes as needed for chest pain.  . pantoprazole (PROTONIX) 40 MG tablet TAKE 1 TABLET(40 MG) BY MOUTH DAILY  . REPATHA SURECLICK 130 MG/ML SOAJ ADMINISTER 1 ML UNDER THE SKIN EVERY 14 DAYS  . Semaglutide (RYBELSUS) 7 MG TABS Take 7 mg by mouth daily.  . tadalafil (CIALIS) 20 MG tablet Take 0.5-1 tablets (10-20 mg total) by mouth every other day as needed for erectile dysfunction.  . vitamin C (ASCORBIC ACID) 250 MG tablet Take 500 mg by mouth daily. Gummie  . montelukast (SINGULAIR) 10 MG tablet Take 1 tablet (10 mg total) by mouth at bedtime. (Patient not taking: Reported on 03/24/2020)  . Multiple Vitamins-Minerals (HAIR SKIN AND NAILS FORMULA) TABS Take  1 tablet by mouth daily. Gummie  . sitaGLIPtin (JANUVIA) 100 MG tablet Take 1 tablet (100 mg total) by mouth daily. (Patient not taking: Reported on 03/24/2020)  . Suvorexant 10 MG TABS Take 10 mg by mouth at bedtime. (Patient not taking: Reported on 03/24/2020)   No facility-administered encounter medications on file as of 03/24/2020.    Allergies (verified) Plavix [clopidogrel bisulfate], Oxycontin [oxycodone hcl], Penicillins, Pioglitazone, Prednisone, and Simvastatin   History: Past Medical History:  Diagnosis Date  . 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 Arthritis     "lower back" (07/29/2016)  . ASCVD (arteriosclerotic cardiovascular disease)    -luminal irregularities in 1998 and 2005; normal EF  . Benign prostatic hypertrophy    status post transurethral resection of the prostate  . CAD (coronary artery disease)    a. Cath 07/29/16 99% ostial RCA & 70% pRCA s/p cutting balloon angioplasty and single SYNERGY DES 2.5X28. 100% very Distal 1st RPLB lesion --> the occlusion site moved further distal with guidewire advancement;  40% ostial LAD; 50% Ost Cx to Prox Cx lesion.  . Coronary artery disease   . Dysphagia   . Erectile dysfunction   . GERD (gastroesophageal reflux disease)   . Heart murmur   . History of hiatal hernia   . History of kidney stones   . Hyperlipidemia   . Hypertension   . Seasonal allergies   . Tobacco abuse    20 pack years; discontinued 1995  . Type II diabetes mellitus (HCC)    Past Surgical History:  Procedure Laterality Date  . APPENDECTOMY    . CATARACT EXTRACTION W/ INTRAOCULAR LENS  IMPLANT, BILATERAL Bilateral   . COLONOSCOPY  07/20/2011   Dr. Jena Gaussourk: multiple hyperplastic polyps. Surveillance 2018  . COLONOSCOPY N/A 06/30/2016   Procedure: COLONOSCOPY;  Surgeon: Corbin Adeourk, Robert M, MD;  Location: [facility];  Service: Endoscopy;  Laterality: N/A;  10:00am  . COLONOSCOPY W/ BIOPSIES AND POLYPECTOMY  07/08/08, 06/2011   Dr Marlowe Altourk-anal papilla, diminutive rectal polyp ablated the, left-sided diverticula, piecemeal polypectomy of hepatic flexure adenomatous polyp, diminutive polyps near hepatic flexure ablated  . CORONARY ANGIOPLASTY WITH STENT PLACEMENT  07/29/2016  . CORONARY STENT INTERVENTION N/A 07/29/2016   Procedure: Coronary Stent Intervention;  Surgeon: Marykay LexHarding, David W, MD;  [facility];  Service: Cardiovascular;  Laterality: N/A;  RCA  . ESOPHAGOGASTRODUODENOSCOPY  2013   erosive reflux esophagitis, s/p empiric dilation. chronic duodenitis.   [name]. LEFT HEART CATH AND CORONARY ANGIOGRAPHY N/A 07/29/2016    Procedure: Left Heart Cath and Coronary Angiography;  Surgeon: Marykay LexHarding, David W, MD;  [facility];  Service: Cardiovascular;  Laterality: N/A;  . NASAL SEPTUM SURGERY    . NASAL SINUS SURGERY     "cleaned out my sinus"  . 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 PERIPHERAL VASCULAR CATHETERIZATION N/A 11/26/2015   Procedure: Abdominal Aortogram w/Lower Extremity;  Surgeon: Iran OuchMuhammad A Arida, MD;  Location: [facility] INVASIVE CV LAB;  Service: Cardiovascular;  Laterality: N/A;  . TONSILLECTOMY    . TRANSURETHRAL RESECTION OF PROSTATE  2009   Family History  Problem Relation Age of Onset  . Ovarian cancer Mother   . Diabetes Brother   . Colon cancer Sister [id number]       deceased from colon cancer  . Diabetes Maternal Grandmother    Social History  Socioeconomic History  . Marital status: Married    Spouse name: Not on file  . Number of children: 2  . Years of education: Not on file  . Highest education level: Not on file  Occupational History  . 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 Smoking status: Former Smoker    Packs/day: 1.00    Years: 15.00    Pack years: 15.00    Types: Cigarettes    Start date: 03/08/1965    Quit date: 07/07/1993    Years since quitting: 26.7  . Smokeless tobacco: Never Used  Vaping Use  . Vaping Use: Never used  Substance and Sexual Activity  . Alcohol use: No    Alcohol/week: 0.0 standard drinks    Comment: 07/29/2016 "last drink was 4 wks ago; had a beer"  . Drug use: No  . Sexual activity: Not Currently  Other Topics Concern  . Not on file  Social History Narrative   5 grandchildren    Social Determinants of Health   Financial Resource Strain: Low Risk   . Difficulty of Paying Living Expenses: Not hard at all  Food Insecurity: No Food  Insecurity  . Worried About Charity fundraiser in the Last Year: Never true  . Ran Out of Food in the Last Year: Never true  Transportation Needs: No Transportation Needs  . Lack of Transportation (Medical): No  . Lack of Transportation (Non-Medical): No  Physical Activity: Sufficiently Active  . Days of Exercise per Week: 4 days  . Minutes of Exercise per Session: 40 min  Stress: No Stress Concern Present  . Feeling of Stress : Not at all  Social Connections: Moderately Integrated  . Frequency of Communication with Friends and Family: More than three times a week  . Frequency of Social Gatherings with Friends and Family: More than three times a week  . Attends Religious Services: More than 4 times per year  . Active Member of Clubs or Organizations: No  . Attends Archivist Meetings: Never  . Marital Status: Married    Tobacco Counseling Counseling given: Not Answered   Clinical Intake:  Pre-visit preparation completed: Yes  Pain : No/denies pain     Nutritional Status: BMI 25 -29 Overweight Nutritional Risks: None Diabetes: Yes CBG done?: No Did pt. bring in CBG monitor from home?: No  How often do you need to have someone help you when you read instructions, pamphlets, or other written materials from your doctor or pharmacy?: 1 - Never  Diabetes:  Is the patient diabetic?  Yes  If diabetic, was a CBG obtained today?  No  Did the patient bring in their glucometer from home?  No  How often do you monitor your CBG's? occasionally.   Financial Strains and Diabetes Management:  Are you having any financial strains with the device, your supplies or your medication? No .  Does the patient want to be seen by Chronic Care Management for management of their diabetes?  No  Would the patient like to be referred to a Nutritionist or for Diabetic Management?  No   Diabetic Exams:  Diabetic Eye Exam: Completed 06/11/2019.   Diabetic Foot Exam: Completed  02/05/2020.     Interpreter Needed?: No  Information entered by :: Caroleen Hamman LPN   Activities of Daily Living In your present state of health, do you have any difficulty performing the following activities: 03/24/2020  Hearing? N  Vision? N  Difficulty concentrating or making decisions? Y  Comment occasionally forgets names  Walking or climbing stairs? N  Dressing or bathing? N  Doing errands, shopping? N  Preparing Food and eating ? N  Using the Toilet? N  In the past six months, have you accidently leaked urine? Y  Comment occasionally  Do you have problems with loss of bowel control? N  Managing your Medications? N  Managing your Finances? N  Housekeeping or managing your Housekeeping? N  Some recent data might be hidden    Patient Care Team: Delorse Limber as PCP - General (Family Medicine) Wellington Hampshire, MD as PCP - Cardiology (Cardiology) Orion Crook, MD (Inactive) (Urology) Gala Romney Cristopher Estimable, MD (Gastroenterology) Herminio Commons, MD (Inactive) as Attending Physician (Cardiology) Philemon Kingdom, MD as Consulting Physician (Internal Medicine) Allyn Kenner, MD (Dermatology) [facility] [place], MD as Attending Physician  Indicate any recent Medical Services you may have received from other than [facility] providers in the past year (date may be approximate).     Assessment:   This is a routine wellness examination for Marcus Norton.  Hearing/Vision screen  Hearing Screening   125Hz  250Hz  500Hz  1000Hz  2000Hz  3000Hz  4000Hz  6000Hz  8000Hz   Right ear:           Left ear:           Comments: Mild hearing loss  Vision Screening Comments: Wears glasses Last eye exam-09/2019-Dr. Rankin  Dietary issues and exercise activities discussed: Current Exercise Habits: Home exercise routine, Type of exercise: treadmill;strength training/weights, Time (Minutes): 45, Frequency (Times/Week): 4, Weekly Exercise (Minutes/Week): 180, Intensity: Mild, Exercise  limited by: None identified  Goals    . Patient Stated     Stay active.       Depression Screen PHQ 2/9 Scores 03/24/2020 03/14/2019 06/27/2018 03/10/2018 03/08/2017 01/03/2017 10/01/2016  PHQ - 2 Score 0 0 0 0 1 0 0  PHQ- 9 Score - 2 2 0 1 4 5   Exception Documentation - - - - - - (No Data)    Fall Risk Fall Risk  03/24/2020 08/07/2019 03/14/2019 03/08/2017 03/08/2017  Falls in the past year? 0 0 0 No No  Number falls in past yr: 0 0 - - -  Injury with Fall? 0 0 0 - -  Risk for fall due to : - - - - -  Risk for fall due to: Comment - - - - -  Follow up Falls prevention discussed - Falls evaluation completed;Education provided;Falls prevention discussed - -    FALL RISK PREVENTION PERTAINING TO THE HOME:  Any stairs in or around the home? Yes  If so, are there any without handrails? No  Home free of loose throw rugs in walkways, pet beds, electrical cords, etc? Yes  Adequate lighting in your home to reduce risk of falls? Yes   ASSISTIVE DEVICES UTILIZED TO PREVENT FALLS:  Life alert? No  Use of a cane, walker or w/c? No  Grab bars in the bathroom? Yes  Shower chair or bench in shower? No  Elevated toilet seat or a handicapped toilet? No   TIMED UP AND GO:  Was the test performed? Yes .  Length of time to ambulate 10 feet: 10 sec.   Gait steady and fast without use of assistive device  Cognitive Function:Normal cognitive status assessed by direct observation by this Nurse Health Advisor. No abnormalities found.   MMSE - Mini Mental State Exam 03/10/2018 03/08/2017  Orientation to time  5 5  Orientation to Place 5 5  Registration 3 3  Attention/ Calculation 3 5  Recall 3 1  Language- name 2 objects 2 2  Language- repeat 1 1  Language- follow 3 step command 3 3  Language- read & follow direction 1 1  Write a sentence 1 1  Copy design 1 1  Total score 28 28     6CIT Screen 03/14/2019  What Year? 0 points  What month? 0 points  What time? 0 points  Count back from 20 0  points  Months in reverse 0 points  Repeat phrase 2 points  Total Score 2    Immunizations Immunization History  Administered Date(s) Administered  . Fluad Quad(high Dose 65+) 11/16/2018  . Influenza, High Dose Seasonal PF 11/03/2015, 11/17/2017, 11/29/2019  . Influenza,inj,Quad PF,6+ Mos 11/20/2016  . Influenza,inj,quad, With Preservative 11/22/2016  . Influenza-Unspecified 11/20/2016  . Moderna Sars-Covid-2 Vaccination 03/06/2019, 04/16/2019, 11/23/2019  . Pneumococcal Conjugate-13 03/04/2016  . Pneumococcal Polysaccharide-23 03/08/2017  . Td 03/11/2011  . Zoster Recombinat (Shingrix) 05/01/2016, 10/05/2016    TDAP status: Up to date  Flu Vaccine status: Up to date  Pneumococcal vaccine status: Up to date  Covid-19 vaccine status: Completed vaccines  Qualifies for Shingles Vaccine? No   Zostavax completed No   Shingrix Completed?: Yes  Screening Tests Health Maintenance  Topic Date Due  . URINE MICROALBUMIN  03/11/2019  . DTAP VACCINES (1) 07/11/2078 (Originally 05/07/1940)  . OPHTHALMOLOGY EXAM  06/10/2020  . HEMOGLOBIN A1C  06/19/2020  . FOOT EXAM  02/04/2021  . DTaP/Tdap/Td (2 - Tdap) 03/10/2021  . TETANUS/TDAP  03/10/2021  . INFLUENZA VACCINE  Completed  . COVID-19 Vaccine  Completed  . PNA vac Low Risk Adult  Completed    Health Maintenance  Health Maintenance Due  Topic Date Due  . URINE MICROALBUMIN  03/11/2019    Colorectal cancer screening: No longer required.   Lung Cancer Screening: (Low Dose CT Chest recommended if Age 69-80 years, 30 pack-year currently smoking OR have quit w/in 15years.) does not qualify.    Additional Screening:  Hepatitis C Screening: does not qualify  Vision Screening: Recommended annual ophthalmology exams for early detection of glaucoma and other disorders of the eye. Is the patient up to date with their annual eye exam?  Yes  Who is the provider or what is the name of the office in which the patient attends annual  eye exams? Dr. Luciana Axe   Dental Screening: Recommended annual dental exams for proper oral hygiene  Community Resource Referral / Chronic Care Management: CRR required this visit?  No   CCM required this visit?  No      Plan:     I have personally reviewed and noted the following in the patient's chart:   . Medical and social history . Use of alcohol, tobacco or illicit drugs  . Current medications and supplements . Functional ability and status . Nutritional status . Physical activity . Advanced directives . List of other physicians . Hospitalizations, surgeries, and ER visits in previous 12 months . Vitals . Screenings to include cognitive, depression, and falls . Referrals and appointments  In addition, I have reviewed and discussed with patient certain preventive protocols, quality metrics, and best practice recommendations. A written personalized care plan for preventive services as well as general preventive health recommendations were provided to patient.     Roanna Raider, LPN   10/25/4095  Nurse health Advisor  Nurse Notes: None

## 2020-03-25 ENCOUNTER — Ambulatory Visit: Payer: Medicare PPO | Admitting: Endocrinology

## 2020-03-25 ENCOUNTER — Encounter: Payer: Self-pay | Admitting: Endocrinology

## 2020-03-25 VITALS — BP 138/64 | HR 78 | Ht 67.0 in | Wt 181.0 lb

## 2020-03-25 DIAGNOSIS — E1151 Type 2 diabetes mellitus with diabetic peripheral angiopathy without gangrene: Secondary | ICD-10-CM | POA: Diagnosis not present

## 2020-03-25 DIAGNOSIS — E1165 Type 2 diabetes mellitus with hyperglycemia: Secondary | ICD-10-CM | POA: Diagnosis not present

## 2020-03-25 DIAGNOSIS — E119 Type 2 diabetes mellitus without complications: Secondary | ICD-10-CM

## 2020-03-25 DIAGNOSIS — IMO0002 Reserved for concepts with insufficient information to code with codable children: Secondary | ICD-10-CM

## 2020-03-25 LAB — POCT GLYCOSYLATED HEMOGLOBIN (HGB A1C): Hemoglobin A1C: 8.1 % — AB (ref 4.0–5.6)

## 2020-03-25 MED ORDER — RYBELSUS 14 MG PO TABS: 14.0000 mg | ORAL_TABLET | Freq: Every day | ORAL | 3 refills | Status: DC

## 2020-03-25 NOTE — Progress Notes (Signed)
Subjective:    Patient ID: Marcus Norton, male    DOB: Jun 25, 1940, 80 y.o.   MRN: [id number]  HPI  Pt returns for f/u of diabetes mellitus: DM type: Insulin-requiring type 2 Dx'ed: 3546 Complications: DR, CAD, and PAD Therapy: insulin since 2009 DKA: never Severe hypoglycemia: never Pancreatitis: never Pancreatic imaging: normal on 2017 CT SDOH: none Other: he takes QD insulin, at least for now.  Interval history: He brings a record of his cbg's which I have reviewed today.  cbg varies from 106-175.  he checks fasting only.   Past Medical History:  Diagnosis Date  . 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 Arthritis    "lower back" (07/29/2016)  . ASCVD (arteriosclerotic cardiovascular disease)    -luminal irregularities in 1998 and 2005; normal EF  . Benign prostatic hypertrophy    status post transurethral resection of the prostate  . CAD (coronary artery disease)    a. Cath 07/29/16 99% ostial RCA & 70% pRCA s/p cutting balloon angioplasty and single SYNERGY DES 2.5X28. 100% very Distal 1st RPLB lesion --> the occlusion site moved further distal with guidewire advancement;  40% ostial LAD; 50% Ost Cx to Prox Cx lesion.  . Coronary artery disease   . Dysphagia   . Erectile dysfunction   . GERD (gastroesophageal reflux disease)   . Heart murmur   . History of hiatal hernia   . History of kidney stones   . Hyperlipidemia   . Hypertension   . Seasonal allergies   . Tobacco abuse    20 pack years; discontinued 1995  . Type II diabetes mellitus ([place])     Past Surgical History:  Procedure Laterality Date  . APPENDECTOMY    . CATARACT EXTRACTION W/ INTRAOCULAR LENS  IMPLANT, BILATERAL Bilateral   . COLONOSCOPY  07/20/2011   Dr. Gala Romney: multiple hyperplastic polyps. Surveillance 2018  . COLONOSCOPY N/A 06/30/2016   Procedure: COLONOSCOPY;  Surgeon: Daneil Dolin, MD;  [facility];  Service: Endoscopy;  Laterality: N/A;  10:00am  . COLONOSCOPY W/ BIOPSIES AND  POLYPECTOMY  07/08/08, 06/2011   Dr Madolyn Frieze papilla, diminutive rectal polyp ablated the, left-sided diverticula, piecemeal polypectomy of hepatic flexure adenomatous polyp, diminutive polyps near hepatic flexure ablated  . CORONARY ANGIOPLASTY WITH STENT PLACEMENT  07/29/2016  . CORONARY STENT INTERVENTION N/A 07/29/2016   Procedure: Coronary Stent Intervention;  Surgeon: Leonie Man, MD;  Location: [facility];  Service: Cardiovascular;  Laterality: N/A;  RCA  . ESOPHAGOGASTRODUODENOSCOPY  2013   erosive reflux esophagitis, s/p empiric dilation. chronic duodenitis.   [name] LEFT HEART CATH AND CORONARY ANGIOGRAPHY N/A 07/29/2016   Procedure: Left Heart Cath and Coronary Angiography;  Surgeon: Leonie Man, MD;  Location: [facility];  Service: Cardiovascular;  Laterality: N/A;  . NASAL SEPTUM SURGERY    . NASAL SINUS SURGERY     "cleaned out my sinus"  . PACEMAKER IMPLANT N/A 10/15/2019   Procedure: PACEMAKER IMPLANT;  Surgeon: Sanda Klein, MD;  Location: [facility];  Service: Cardiovascular;  Laterality: N/A;  . PERIPHERAL VASCULAR CATHETERIZATION N/A 11/26/2015   Procedure: Abdominal Aortogram w/Lower Extremity;  Surgeon: Wellington Hampshire, MD;  Location: [facility];  Service: Cardiovascular;  Laterality: N/A;  . TONSILLECTOMY    . TRANSURETHRAL RESECTION OF PROSTATE  2009    Social History   Socioeconomic History  . Marital status: Married    Spouse name: Not on file  . Number of children:  2  . Years of education: Not on file  . Highest education level: Not on file  Occupational History  . 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 Smoking status: Former Smoker    Packs/day: 1.00    Years: 15.00    Pack years: 15.00    Types: Cigarettes    Start date: 03/08/1965    Quit date: 07/07/1993    Years since quitting: 26.7  . Smokeless tobacco: Never Used  Vaping Use  . Vaping Use:  Never used  Substance and Sexual Activity  . Alcohol use: No    Alcohol/week: 0.0 standard drinks    Comment: 07/29/2016 "last drink was 4 wks ago; had a beer"  . Drug use: No  . Sexual activity: Not Currently  Other Topics Concern  . Not on file  Social History Narrative   5 grandchildren    Social Determinants of Health   Financial Resource Strain: Low Risk   . Difficulty of Paying Living Expenses: Not hard at all  Food Insecurity: No Food Insecurity  . Worried About Charity fundraiser in the Last Year: Never true  . Ran Out of Food in the Last Year: Never true  Transportation Needs: No Transportation Needs  . Lack of Transportation (Medical): No  . Lack of Transportation (Non-Medical): No  Physical Activity: Sufficiently Active  . Days of Exercise per Week: 4 days  . Minutes of Exercise per Session: 40 min  Stress: No Stress Concern Present  . Feeling of Stress : Not at all  Social Connections: Moderately Integrated  . Frequency of Communication with Friends and Family: More than three times a week  . Frequency of Social Gatherings with Friends and Family: More than three times a week  . Attends Religious Services: More than 4 times per year  . Active Member of Clubs or Organizations: No  . Attends Archivist Meetings: Never  . Marital Status: Married  Human resources officer Violence: Not At Risk  . Fear of Current or Ex-Partner: No  . Emotionally Abused: No  . Physically Abused: No  . Sexually Abused: No    Current Outpatient Medications on File Prior to Visit  Medication Sig Dispense Refill  . ACCU-CHEK AVIVA PLUS test strip     . alfuzosin (UROXATRAL) 10 MG 24 hr tablet Take 10 mg by mouth daily with breakfast.    . amLODipine (NORVASC) 5 MG tablet Take 1 tablet (5 mg total) by mouth daily. 90 tablet 1  . aspirin EC 81 MG tablet Take 81 mg by mouth at bedtime.     [name] atorvastatin (LIPITOR) 10 MG tablet Take 1 tablet (10 mg total) by mouth once a week. (Patient  taking differently: Take 10 mg by mouth every Wednesday.) 90 tablet 1  . Cholecalciferol (VITAMIN D3) 50 MCG (2000 UT) TABS Take 2,000 Units by mouth daily.    . fluticasone furoate-vilanterol (BREO ELLIPTA) 100-25 MCG/INH AEPB Inhale 1 puff into the lungs daily. 180 each 2  . Insulin Pen Needle (PEN NEEDLES) 32G X 4 MM MISC To use daily with Lantus solostar. Dx E11.9 100 each 1  . LANTUS SOLOSTAR 100 UNIT/ML Solostar Pen Inject 30 Units into the skin every morning. 15 mL 2  . metFORMIN (GLUCOPHAGE-XR) 500 MG 24 hr tablet Take 4 tablets (2,000 mg total) by mouth daily. 360 tablet 3  . metoprolol tartrate (LOPRESSOR) 50 MG tablet Take 1 tablet (50 mg  total) by mouth 2 (two) times daily. 180 tablet 3  . montelukast (SINGULAIR) 10 MG tablet Take 1 tablet (10 mg total) by mouth at bedtime. 30 tablet 11  . MULTIPLE VITAMIN PO Take 1 tablet by mouth daily.    . Multiple Vitamins-Minerals (HAIR SKIN AND NAILS FORMULA) TABS Take 1 tablet by mouth daily. Gummie    . nitroGLYCERIN (NITROSTAT) 0.4 MG SL tablet Place 1 tablet (0.4 mg total) under the tongue every 5 (five) minutes as needed for chest pain. 5 tablet 0  . pantoprazole (PROTONIX) 40 MG tablet TAKE 1 TABLET(40 MG) BY MOUTH DAILY 90 tablet 1  . REPATHA SURECLICK 147 MG/ML SOAJ ADMINISTER 1 ML UNDER THE SKIN EVERY 14 DAYS 2 mL 12  . Suvorexant 10 MG TABS Take 10 mg by mouth at bedtime. 30 tablet 0  . tadalafil (CIALIS) 20 MG tablet Take 0.5-1 tablets (10-20 mg total) by mouth every other day as needed for erectile dysfunction. 5 tablet 1  . vitamin C (ASCORBIC ACID) 250 MG tablet Take 500 mg by mouth daily. Gummie     No current facility-administered medications on file prior to visit.    Allergies  Allergen Reactions  . Plavix [Clopidogrel Bisulfate] Shortness Of Breath  . Oxycontin [Oxycodone Hcl] Other (See Comments)    Malaise  . Penicillins Other (See Comments)    Cotton Mouth   . Pioglitazone Other (See Comments)    Made his chest  hurt  . Prednisone Other (See Comments)    Reaction: muscle aches and soreness  . Simvastatin Other (See Comments)    Myalgias    Family History  Problem Relation Age of Onset  . Ovarian cancer Mother   . Diabetes Brother   . Colon cancer Sister 42       deceased from colon cancer  . Diabetes Maternal Grandmother     BP 138/64   Pulse 78   Ht 5\' 7"  (1.702 m)   Wt 181 lb (82.1 kg)   SpO2 94%   BMI 28.35 kg/m   Review of Systems He denies hypoglycemia and nausea.      Objective:   Physical Exam VITAL SIGNS:  See vs page.   GENERAL: no distress Pulses: dorsalis pedis absent bilat.   MSK: no deformity of the feet CV: 2+ bilat leg edema Skin:  no ulcer on the feet.  normal color and temp on the feet.   Neuro: sensation is intact to touch on the feet.    A1c=8.1%    Assessment & Plan:  Insulin-requiring type 2 DM, with PAD: uncontrolled.   Patient Instructions  check your blood sugar twice a day.  vary the time of day when you check, between before the 3 meals, and at bedtime.  also check if you have symptoms of your blood sugar being too high or too low.  please keep a record of the readings and bring it to your next appointment here (or you can bring the meter itself).  You can write it on any piece of paper.  please call us sooner if your blood sugar goes below 70, or if you have a lot of readings over 200.   We will need to take this complex situation in stages.   I have sent 2 prescriptions to your pharmacy, increase the Rybelsus.   Please continue the same other medications.  Please come back for a follow-up appointment in 2 months.

## 2020-03-25 NOTE — Patient Instructions (Addendum)
check your blood sugar twice a day.  vary the time of day when you check, between before the 3 meals, and at bedtime.  also check if you have symptoms of your blood sugar being too high or too low.  please keep a record of the readings and bring it to your next appointment here (or you can bring the meter itself).  You can write it on any piece of paper.  please call us sooner if your blood sugar goes below 70, or if you have a lot of readings over 200.   We will need to take this complex situation in stages.   I have sent 2 prescriptions to your pharmacy, increase the Rybelsus.   Please continue the same other medications.  Please come back for a follow-up appointment in 2 months.

## 2020-04-15 ENCOUNTER — Ambulatory Visit (INDEPENDENT_AMBULATORY_CARE_PROVIDER_SITE_OTHER): Payer: Medicare PPO

## 2020-04-15 DIAGNOSIS — I495 Sick sinus syndrome: Secondary | ICD-10-CM

## 2020-04-15 LAB — CUP PACEART REMOTE DEVICE CHECK
Battery Remaining Longevity: 120 mo
Battery Remaining Percentage: 95.5 %
Battery Voltage: 3.02 V
Brady Statistic AP VP Percent: 1 %
Brady Statistic AP VS Percent: 19 %
Brady Statistic AS VP Percent: 1 %
Brady Statistic AS VS Percent: 80 %
Brady Statistic RA Percent Paced: 18 %
Brady Statistic RV Percent Paced: 1 %
Date Time Interrogation Session: 20220222055438
Implantable Lead Implant Date: 20210823
Implantable Lead Implant Date: 20210823
Implantable Lead Location: 753859
Implantable Lead Location: 753860
Implantable Pulse Generator Implant Date: 20210823
Lead Channel Impedance Value: 400 Ohm
Lead Channel Impedance Value: 580 Ohm
Lead Channel Pacing Threshold Amplitude: 0.75 V
Lead Channel Pacing Threshold Amplitude: 0.75 V
Lead Channel Pacing Threshold Pulse Width: 0.5 ms
Lead Channel Pacing Threshold Pulse Width: 0.5 ms
Lead Channel Sensing Intrinsic Amplitude: 10.2 mV
Lead Channel Sensing Intrinsic Amplitude: 2.9 mV
Lead Channel Setting Pacing Amplitude: 2 V
Lead Channel Setting Pacing Amplitude: 2 V
Lead Channel Setting Pacing Pulse Width: 0.5 ms
Lead Channel Setting Sensing Sensitivity: 2 mV
Pulse Gen Model: 2272
Pulse Gen Serial Number: 3859349

## 2020-04-23 NOTE — Progress Notes (Signed)
Remote pacemaker transmission.   

## 2020-04-28 DIAGNOSIS — J300 Vasomotor rhinitis: Secondary | ICD-10-CM | POA: Diagnosis not present

## 2020-05-27 ENCOUNTER — Ambulatory Visit: Payer: Medicare PPO | Admitting: Endocrinology

## 2020-05-27 ENCOUNTER — Other Ambulatory Visit: Payer: Self-pay

## 2020-05-27 VITALS — BP 130/60 | HR 87 | Ht 67.0 in | Wt 178.4 lb

## 2020-05-27 DIAGNOSIS — E1165 Type 2 diabetes mellitus with hyperglycemia: Secondary | ICD-10-CM

## 2020-05-27 DIAGNOSIS — E119 Type 2 diabetes mellitus without complications: Secondary | ICD-10-CM

## 2020-05-27 DIAGNOSIS — E1151 Type 2 diabetes mellitus with diabetic peripheral angiopathy without gangrene: Secondary | ICD-10-CM | POA: Diagnosis not present

## 2020-05-27 DIAGNOSIS — IMO0002 Reserved for concepts with insufficient information to code with codable children: Secondary | ICD-10-CM

## 2020-05-27 LAB — POCT GLYCOSYLATED HEMOGLOBIN (HGB A1C): Hemoglobin A1C: 7.1 % — AB (ref 4.0–5.6)

## 2020-05-27 MED ORDER — DAPAGLIFLOZIN PROPANEDIOL 5 MG PO TABS: 5.0000 mg | ORAL_TABLET | Freq: Every day | ORAL | 11 refills | Status: DC

## 2020-05-27 MED ORDER — LANTUS SOLOSTAR 100 UNIT/ML ~~LOC~~ SOPN: 20.0000 [IU] | PEN_INJECTOR | SUBCUTANEOUS | 2 refills | Status: DC

## 2020-05-27 NOTE — Progress Notes (Signed)
Subjective:    Patient ID: Marcus Norton, male    DOB: Nov 15, 1940, 80 y.o.   MRN: [id number]  HPI Pt returns for f/u of diabetes mellitus: DM type: Insulin-requiring type 2 Dx'ed: 5638 Complications: DR, CAD, and PAD Therapy: insulin since 2009, and 2 oral meds.   DKA: never Severe hypoglycemia: never Pancreatitis: never Pancreatic imaging: normal on 2017 CT SDOH: none Other: he takes QD insulin, at least for now.  Interval history: He brings a record of his cbg's which I have reviewed today.  cbg varies from 112-180.  There is no trend throughout the day.   He reduced Lantus to 28/d, due to hypoglycemia.   Past Medical History:  Diagnosis Date  . 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 Arthritis    "lower back" (07/29/2016)  . ASCVD (arteriosclerotic cardiovascular disease)    -luminal irregularities in 1998 and 2005; normal EF  . Benign prostatic hypertrophy    status post transurethral resection of the prostate  . CAD (coronary artery disease)    a. Cath 07/29/16 99% ostial RCA & 70% pRCA s/p cutting balloon angioplasty and single SYNERGY DES 2.5X28. 100% very Distal 1st RPLB lesion --> the occlusion site moved further distal with guidewire advancement;  40% ostial LAD; 50% Ost Cx to Prox Cx lesion.  . Coronary artery disease   . Dysphagia   . Erectile dysfunction   . GERD (gastroesophageal reflux disease)   . Heart murmur   . History of hiatal hernia   . History of kidney stones   . Hyperlipidemia   . Hypertension   . Seasonal allergies   . Tobacco abuse    20 pack years; discontinued 1995  . Type II diabetes mellitus ([place])     Past Surgical History:  Procedure Laterality Date  . APPENDECTOMY    . CATARACT EXTRACTION W/ INTRAOCULAR LENS  IMPLANT, BILATERAL Bilateral   . COLONOSCOPY  07/20/2011   Dr. Gala Romney: multiple hyperplastic polyps. Surveillance 2018  . COLONOSCOPY N/A 06/30/2016   Procedure: COLONOSCOPY;  Surgeon: Daneil Dolin, MD;  [facility];   Service: Endoscopy;  Laterality: N/A;  10:00am  . COLONOSCOPY W/ BIOPSIES AND POLYPECTOMY  07/08/08, 06/2011   Dr Madolyn Frieze papilla, diminutive rectal polyp ablated the, left-sided diverticula, piecemeal polypectomy of hepatic flexure adenomatous polyp, diminutive polyps near hepatic flexure ablated  . CORONARY ANGIOPLASTY WITH STENT PLACEMENT  07/29/2016  . CORONARY STENT INTERVENTION N/A 07/29/2016   Procedure: Coronary Stent Intervention;  Surgeon: Leonie Man, MD;  Location: [facility];  Service: Cardiovascular;  Laterality: N/A;  RCA  . ESOPHAGOGASTRODUODENOSCOPY  2013   erosive reflux esophagitis, s/p empiric dilation. chronic duodenitis.   [name] LEFT HEART CATH AND CORONARY ANGIOGRAPHY N/A 07/29/2016   Procedure: Left Heart Cath and Coronary Angiography;  Surgeon: Leonie Man, MD;  Location: [facility];  Service: Cardiovascular;  Laterality: N/A;  . NASAL SEPTUM SURGERY    . NASAL SINUS SURGERY     "cleaned out my sinus"  . PACEMAKER IMPLANT N/A 10/15/2019   Procedure: PACEMAKER IMPLANT;  Surgeon: Sanda Klein, MD;  Location: [facility];  Service: Cardiovascular;  Laterality: N/A;  . PERIPHERAL VASCULAR CATHETERIZATION N/A 11/26/2015   Procedure: Abdominal Aortogram w/Lower Extremity;  Surgeon: Wellington Hampshire, MD;  Location: [facility];  Service: Cardiovascular;  Laterality: N/A;  . TONSILLECTOMY    . TRANSURETHRAL RESECTION OF PROSTATE  2009    Social History   Socioeconomic History  .  Marital status: Married    Spouse name: Not on file  . Number of children: 2  . Years of education: Not on file  . Highest education level: Not on file  Occupational History  . 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 Smoking status: Former Smoker    Packs/day: 1.00    Years: 15.00    Pack years: 15.00    Types: Cigarettes    Start date: 03/08/1965    Quit date: 07/07/1993    Years since  quitting: 26.9  . Smokeless tobacco: Never Used  Vaping Use  . Vaping Use: Never used  Substance and Sexual Activity  . Alcohol use: No    Alcohol/week: 0.0 standard drinks    Comment: 07/29/2016 "last drink was 4 wks ago; had a beer"  . Drug use: No  . Sexual activity: Not Currently  Other Topics Concern  . Not on file  Social History Narrative   5 grandchildren    Social Determinants of Health   Financial Resource Strain: Low Risk   . Difficulty of Paying Living Expenses: Not hard at all  Food Insecurity: No Food Insecurity  . Worried About Charity fundraiser in the Last Year: Never true  . Ran Out of Food in the Last Year: Never true  Transportation Needs: No Transportation Needs  . Lack of Transportation (Medical): No  . Lack of Transportation (Non-Medical): No  Physical Activity: Sufficiently Active  . Days of Exercise per Week: 4 days  . Minutes of Exercise per Session: 40 min  Stress: No Stress Concern Present  . Feeling of Stress : Not at all  Social Connections: Moderately Integrated  . Frequency of Communication with Friends and Family: More than three times a week  . Frequency of Social Gatherings with Friends and Family: More than three times a week  . Attends Religious Services: More than 4 times per year  . Active Member of Clubs or Organizations: No  . Attends Archivist Meetings: Never  . Marital Status: Married  Human resources officer Violence: Not At Risk  . Fear of Current or Ex-Partner: No  . Emotionally Abused: No  . Physically Abused: No  . Sexually Abused: No    Current Outpatient Medications on File Prior to Visit  Medication Sig Dispense Refill  . ACCU-CHEK AVIVA PLUS test strip     . alfuzosin (UROXATRAL) 10 MG 24 hr tablet Take 10 mg by mouth daily with breakfast.    . amLODipine (NORVASC) 5 MG tablet Take 1 tablet (5 mg total) by mouth daily. 90 tablet 1  . aspirin EC 81 MG tablet Take 81 mg by mouth at bedtime.     [name] atorvastatin  (LIPITOR) 10 MG tablet Take 1 tablet (10 mg total) by mouth once a week. (Patient taking differently: Take 10 mg by mouth every Wednesday.) 90 tablet 1  . Cholecalciferol (VITAMIN D3) 50 MCG (2000 UT) TABS Take 2,000 Units by mouth daily.    . fluticasone furoate-vilanterol (BREO ELLIPTA) 100-25 MCG/INH AEPB Inhale 1 puff into the lungs daily. 180 each 2  . Insulin Pen Needle (PEN NEEDLES) 32G X 4 MM MISC To use daily with Lantus solostar. Dx E11.9 100 each 1  . metFORMIN (GLUCOPHAGE-XR) 500 MG 24 hr tablet Take 4 tablets (2,000 mg total) by mouth daily. 360 tablet 3  . metoprolol tartrate (LOPRESSOR) 50 MG tablet Take 1 tablet (50 mg total) by mouth  2 (two) times daily. 180 tablet 3  . montelukast (SINGULAIR) 10 MG tablet Take 1 tablet (10 mg total) by mouth at bedtime. 30 tablet 11  . MULTIPLE VITAMIN PO Take 1 tablet by mouth daily.    . Multiple Vitamins-Minerals (HAIR SKIN AND NAILS FORMULA) TABS Take 1 tablet by mouth daily. Gummie    . nitroGLYCERIN (NITROSTAT) 0.4 MG SL tablet Place 1 tablet (0.4 mg total) under the tongue every 5 (five) minutes as needed for chest pain. 5 tablet 0  . pantoprazole (PROTONIX) 40 MG tablet TAKE 1 TABLET(40 MG) BY MOUTH DAILY 90 tablet 1  . REPATHA SURECLICK 884 MG/ML SOAJ ADMINISTER 1 ML UNDER THE SKIN EVERY 14 DAYS 2 mL 12  . Semaglutide (RYBELSUS) 14 MG TABS Take 14 mg by mouth daily. 90 tablet 3  . Suvorexant 10 MG TABS Take 10 mg by mouth at bedtime. 30 tablet 0  . tadalafil (CIALIS) 20 MG tablet Take 0.5-1 tablets (10-20 mg total) by mouth every other day as needed for erectile dysfunction. 5 tablet 1  . vitamin C (ASCORBIC ACID) 250 MG tablet Take 500 mg by mouth daily. Gummie     No current facility-administered medications on file prior to visit.    Allergies  Allergen Reactions  . Plavix [Clopidogrel Bisulfate] Shortness Of Breath  . Oxycontin [Oxycodone Hcl] Other (See Comments)    Malaise  . Penicillins Other (See Comments)    Cotton  Mouth   . Pioglitazone Other (See Comments)    Made his chest hurt  . Prednisone Other (See Comments)    Reaction: muscle aches and soreness  . Simvastatin Other (See Comments)    Myalgias    Family History  Problem Relation Age of Onset  . Ovarian cancer Mother   . Diabetes Brother   . Colon cancer Sister 89       deceased from colon cancer  . Diabetes Maternal Grandmother     BP 130/60 (BP Location: Right Arm, Patient Position: Sitting, Cuff Size: Normal)   Pulse 87   Ht 5\' 7"  (1.702 m)   Wt 178 lb 6.4 oz (80.9 kg)   SpO2 94%   BMI 27.94 kg/m    Review of Systems     Objective:   Physical Exam Pulses: dorsalis pedis absent bilat.   MSK: no deformity of the feet CV: 2+ bilat leg edema Skin:  no ulcer on the feet.  normal color and temp on the feet.   Neuro: sensation is intact to touch on the feet.   A1c=7.1%  Lab Results  Component Value Date   CREATININE 0.77 01/30/2020   BUN 19 01/30/2020   NA 140 01/30/2020   K 4.6 01/30/2020   CL 101 01/30/2020   CO2 31 01/30/2020      Assessment & Plan:  Insulin-requiring type 2 DM.  Hypoglycemia, due to insulin: this limits aggressiveness of glycemic control.     Patient Instructions  check your blood sugar twice a day.  vary the time of day when you check, between before the 3 meals, and at bedtime.  also check if you have symptoms of your blood sugar being too high or too low.  please keep a record of the readings and bring it to your next appointment here (or you can bring the meter itself).  You can write it on any piece of paper.  please call us sooner if your blood sugar goes below 70, or if you have a lot of readings  over 200.   We will need to take this complex situation in stages.   I have sent 2 prescriptions to your pharmacy, to add "Farxiga," and: Reduce the Lantus to 20 units each morning.    Please continue the same other medications.  Please come back for a follow-up appointment in 3 months.

## 2020-05-27 NOTE — Patient Instructions (Addendum)
check your blood sugar twice a day.  vary the time of day when you check, between before the 3 meals, and at bedtime.  also check if you have symptoms of your blood sugar being too high or too low.  please keep a record of the readings and bring it to your next appointment here (or you can bring the meter itself).  You can write it on any piece of paper.  please call us sooner if your blood sugar goes below 70, or if you have a lot of readings over 200.   We will need to take this complex situation in stages.   I have sent 2 prescriptions to your pharmacy, to add "Farxiga," and: Reduce the Lantus to 20 units each morning.    Please continue the same other medications.  Please come back for a follow-up appointment in 3 months.

## 2020-06-04 ENCOUNTER — Telehealth: Payer: Self-pay | Admitting: Endocrinology

## 2020-06-04 NOTE — Telephone Encounter (Signed)
Patient called stating he has had some reactions to the [place] after starting it he's had some diarrhea, dizziness and felt light headed. He said he has stopped taking it completely and has gone back to his previous regimen of insulin. Patient states he does not need to have anyone call him back unless need be, he just wanted to inform Dr Loanne Drilling of this change.

## 2020-07-02 DIAGNOSIS — I2510 Atherosclerotic heart disease of native coronary artery without angina pectoris: Secondary | ICD-10-CM | POA: Insufficient documentation

## 2020-07-02 DIAGNOSIS — R5383 Other fatigue: Secondary | ICD-10-CM | POA: Diagnosis not present

## 2020-07-02 DIAGNOSIS — J101 Influenza due to other identified influenza virus with other respiratory manifestations: Secondary | ICD-10-CM | POA: Diagnosis not present

## 2020-07-02 DIAGNOSIS — I10 Essential (primary) hypertension: Secondary | ICD-10-CM | POA: Insufficient documentation

## 2020-07-02 DIAGNOSIS — Z87891 Personal history of nicotine dependence: Secondary | ICD-10-CM | POA: Insufficient documentation

## 2020-07-13 ENCOUNTER — Other Ambulatory Visit: Payer: Self-pay | Admitting: Pulmonary Disease

## 2020-07-14 DIAGNOSIS — H903 Sensorineural hearing loss, bilateral: Secondary | ICD-10-CM | POA: Diagnosis not present

## 2020-07-15 ENCOUNTER — Ambulatory Visit (INDEPENDENT_AMBULATORY_CARE_PROVIDER_SITE_OTHER): Payer: Medicare PPO

## 2020-07-15 DIAGNOSIS — I495 Sick sinus syndrome: Secondary | ICD-10-CM | POA: Diagnosis not present

## 2020-07-16 DIAGNOSIS — Z950 Presence of cardiac pacemaker: Secondary | ICD-10-CM | POA: Diagnosis not present

## 2020-07-16 DIAGNOSIS — N529 Male erectile dysfunction, unspecified: Secondary | ICD-10-CM | POA: Diagnosis not present

## 2020-07-16 DIAGNOSIS — Z0000 Encounter for general adult medical examination without abnormal findings: Secondary | ICD-10-CM | POA: Diagnosis not present

## 2020-07-16 DIAGNOSIS — N400 Enlarged prostate without lower urinary tract symptoms: Secondary | ICD-10-CM | POA: Diagnosis not present

## 2020-07-16 DIAGNOSIS — I10 Essential (primary) hypertension: Secondary | ICD-10-CM | POA: Diagnosis not present

## 2020-07-16 DIAGNOSIS — E119 Type 2 diabetes mellitus without complications: Secondary | ICD-10-CM | POA: Diagnosis not present

## 2020-07-16 DIAGNOSIS — I209 Angina pectoris, unspecified: Secondary | ICD-10-CM

## 2020-07-16 DIAGNOSIS — E782 Mixed hyperlipidemia: Secondary | ICD-10-CM | POA: Diagnosis not present

## 2020-07-16 DIAGNOSIS — R413 Other amnesia: Secondary | ICD-10-CM | POA: Diagnosis not present

## 2020-07-16 DIAGNOSIS — E113311 Type 2 diabetes mellitus with moderate nonproliferative diabetic retinopathy with macular edema, right eye: Secondary | ICD-10-CM | POA: Diagnosis not present

## 2020-07-16 DIAGNOSIS — K219 Gastro-esophageal reflux disease without esophagitis: Secondary | ICD-10-CM | POA: Diagnosis not present

## 2020-07-16 LAB — CUP PACEART REMOTE DEVICE CHECK
Battery Remaining Longevity: 119 mo
Battery Remaining Percentage: 95.5 %
Battery Voltage: 3.01 V
Brady Statistic AP VP Percent: 1 %
Brady Statistic AP VS Percent: 12 %
Brady Statistic AS VP Percent: 1 %
Brady Statistic AS VS Percent: 88 %
Brady Statistic RA Percent Paced: 11 %
Brady Statistic RV Percent Paced: 1 %
Date Time Interrogation Session: 20220525133811
Implantable Lead Implant Date: 20210823
Implantable Lead Implant Date: 20210823
Implantable Lead Location: 753859
Implantable Lead Location: 753860
Implantable Pulse Generator Implant Date: 20210823
Lead Channel Impedance Value: 410 Ohm
Lead Channel Impedance Value: 610 Ohm
Lead Channel Pacing Threshold Amplitude: 0.75 V
Lead Channel Pacing Threshold Amplitude: 0.75 V
Lead Channel Pacing Threshold Pulse Width: 0.5 ms
Lead Channel Pacing Threshold Pulse Width: 0.5 ms
Lead Channel Sensing Intrinsic Amplitude: 11 mV
Lead Channel Sensing Intrinsic Amplitude: 4.1 mV
Lead Channel Setting Pacing Amplitude: 2 V
Lead Channel Setting Pacing Amplitude: 2 V
Lead Channel Setting Pacing Pulse Width: 0.5 ms
Lead Channel Setting Sensing Sensitivity: 2 mV
Pulse Gen Model: 2272
Pulse Gen Serial Number: 3859349

## 2020-07-17 DIAGNOSIS — L570 Actinic keratosis: Secondary | ICD-10-CM | POA: Diagnosis not present

## 2020-07-17 DIAGNOSIS — X32XXXD Exposure to sunlight, subsequent encounter: Secondary | ICD-10-CM | POA: Diagnosis not present

## 2020-07-22 ENCOUNTER — Encounter (INDEPENDENT_AMBULATORY_CARE_PROVIDER_SITE_OTHER): Payer: Self-pay | Admitting: Ophthalmology

## 2020-07-22 ENCOUNTER — Ambulatory Visit (INDEPENDENT_AMBULATORY_CARE_PROVIDER_SITE_OTHER): Payer: Medicare PPO | Admitting: Ophthalmology

## 2020-07-22 ENCOUNTER — Other Ambulatory Visit: Payer: Self-pay

## 2020-07-22 DIAGNOSIS — E113311 Type 2 diabetes mellitus with moderate nonproliferative diabetic retinopathy with macular edema, right eye: Secondary | ICD-10-CM | POA: Diagnosis not present

## 2020-07-22 DIAGNOSIS — E113312 Type 2 diabetes mellitus with moderate nonproliferative diabetic retinopathy with macular edema, left eye: Secondary | ICD-10-CM | POA: Diagnosis not present

## 2020-07-22 DIAGNOSIS — E113313 Type 2 diabetes mellitus with moderate nonproliferative diabetic retinopathy with macular edema, bilateral: Secondary | ICD-10-CM

## 2020-07-22 DIAGNOSIS — H43823 Vitreomacular adhesion, bilateral: Secondary | ICD-10-CM | POA: Diagnosis not present

## 2020-07-22 DIAGNOSIS — E133393 Other specified diabetes mellitus with moderate nonproliferative diabetic retinopathy without macular edema, bilateral: Secondary | ICD-10-CM | POA: Diagnosis not present

## 2020-07-22 DIAGNOSIS — H43821 Vitreomacular adhesion, right eye: Secondary | ICD-10-CM

## 2020-07-22 NOTE — Progress Notes (Signed)
07/22/2020     CHIEF COMPLAINT Patient presents for Retina Follow Up (8 month fu OU and OCT/8 month fu OU and OCT/Pt states VA OU stable since last visit. Pt denies FOL, floaters, or ocular pain OU. Karie Mainland: 7.5/LBS: 146)   HISTORY OF PRESENT ILLNESS: Marcus Norton is a 80 y.o. male who presents to the clinic today for:   HPI    Retina Follow Up    Diagnosis: Diabetic Retinopathy   Laterality: both eyes   Onset: 8 months ago   Severity: mild   Duration: 8 months   Course: stable   Comments: 8 month fu OU and OCT 8 month fu OU and OCT Pt states VA OU stable since last visit. Pt denies FOL, floaters, or ocular pain OU.  A1C: 7.5 LBS: 146          Comments            Last edited by Kendra Opitz, COA on 07/22/2020 11:09 AM. (History)      Referring physician: [name], PA-C [place],  [place] [id number]  HISTORICAL INFORMATION:   Selected notes from the MEDICAL RECORD NUMBER    Lab Results  Component Value Date   HGBA1C 7.1 (A) 05/27/2020     CURRENT MEDICATIONS: No current outpatient medications on file. (Ophthalmic Drugs)   No current facility-administered medications for this visit. (Ophthalmic Drugs)   Current Outpatient Medications (Other)  Medication Sig  . ACCU-CHEK AVIVA PLUS test strip   . alfuzosin (UROXATRAL) 10 MG 24 hr tablet Take 10 mg by mouth daily with breakfast.  . amLODipine (NORVASC) 5 MG tablet Take 1 tablet (5 mg total) by mouth daily.  [name] aspirin EC 81 MG tablet Take 81 mg by mouth at bedtime.   [name] atorvastatin (LIPITOR) 10 MG tablet Take 1 tablet (10 mg total) by mouth once a week. (Patient taking differently: Take 10 mg by mouth every Wednesday.)  . Cholecalciferol (VITAMIN D3) 50 MCG (2000 UT) TABS Take 2,000 Units by mouth daily.  . dapagliflozin propanediol (FARXIGA) 5 MG TABS tablet Take 1 tablet (5 mg total) by mouth daily.  . fluticasone furoate-vilanterol (BREO ELLIPTA) 100-25 MCG/INH AEPB  Inhale 1 puff into the lungs daily.  . insulin glargine (LANTUS SOLOSTAR) 100 UNIT/ML Solostar Pen Inject 20 Units into the skin every morning.  . Insulin Pen Needle (PEN NEEDLES) 32G X 4 MM MISC To use daily with Lantus solostar. Dx E11.9  . metFORMIN (GLUCOPHAGE-XR) 500 MG 24 hr tablet Take 4 tablets (2,000 mg total) by mouth daily.  . metoprolol tartrate (LOPRESSOR) 50 MG tablet Take 1 tablet (50 mg total) by mouth 2 (two) times daily.  . montelukast (SINGULAIR) 10 MG tablet Take 1 tablet (10 mg total) by mouth at bedtime.  . MULTIPLE VITAMIN PO Take 1 tablet by mouth daily.  . Multiple Vitamins-Minerals (HAIR SKIN AND NAILS FORMULA) TABS Take 1 tablet by mouth daily. Gummie  . nitroGLYCERIN (NITROSTAT) 0.4 MG SL tablet Place 1 tablet (0.4 mg total) under the tongue every 5 (five) minutes as needed for chest pain.  . pantoprazole (PROTONIX) 40 MG tablet TAKE 1 TABLET(40 MG) BY MOUTH DAILY  . REPATHA SURECLICK 315 MG/ML SOAJ ADMINISTER 1 ML UNDER THE SKIN EVERY 14 DAYS  . Semaglutide (RYBELSUS) 14 MG TABS Take 14 mg by mouth daily.  . Suvorexant 10 MG TABS Take 10 mg by mouth at bedtime.  . tadalafil (CIALIS) 20 MG tablet  Take 0.5-1 tablets (10-20 mg total) by mouth every other day as needed for erectile dysfunction.  . vitamin C (ASCORBIC ACID) 250 MG tablet Take 500 mg by mouth daily. Gummie   No current facility-administered medications for this visit. (Other)      REVIEW OF SYSTEMS:    ALLERGIES Allergies  Allergen Reactions  . Plavix [Clopidogrel Bisulfate] Shortness Of Breath  . Oxycontin [Oxycodone Hcl] Other (See Comments)    Malaise  . Penicillins Other (See Comments)    Cot[place] Mouth   . Pioglitazone Other (See Comments)    Made his chest hurt  . Prednisone Other (See Comments)    Reaction: muscle aches and soreness  . Simvastatin Other (See Comments)    Myalgias    PAST MEDICAL HISTORY Past Medical History:  Diagnosis Date  . Adenomatous colon polyp 2000/2010   . Arthritis    "lower back" (07/29/2016)  . ASCVD (arteriosclerotic cardiovascular disease)    -luminal irregularities in 1998 and 2005; normal EF  . Benign prostatic hypertrophy    status post transurethral resection of the prostate  . CAD (coronary artery disease)    a. Cath 07/29/16 99% ostial RCA & 70% pRCA s/p cutting balloon angioplasty and single SYNERGY DES 2.5X28. 100% very Distal 1st RPLB lesion --> the occlusion site moved further distal with guidewire advancement;  40% ostial LAD; 50% Ost Cx to Prox Cx lesion.  . Coronary artery disease   . Dysphagia   . Erectile dysfunction   . GERD (gastroesophageal reflux disease)   . Heart murmur   . History of hiatal hernia   . History of kidney stones   . Hyperlipidemia   . Hypertension   . Seasonal allergies   . Tobacco abuse    20 pack years; discontinued 1995  . Type II diabetes mellitus ([place])    Past Surgical History:  Procedure Laterality Date  . APPENDECTOMY    . CATARACT EXTRACTION W/ INTRAOCULAR LENS  IMPLANT, BILATERAL Bilateral   . COLONOSCOPY  07/20/2011   Dr. Gala Romney: multiple hyperplastic polyps. Surveillance 2018  . COLONOSCOPY N/A 06/30/2016   Procedure: COLONOSCOPY;  Surgeon: Daneil Dolin, MD;  [facility];  Service: Endoscopy;  Laterality: N/A;  10:00am  . COLONOSCOPY W/ BIOPSIES AND POLYPECTOMY  07/08/08, 06/2011   Dr Madolyn Frieze papilla, diminutive rectal polyp ablated the, left-sided diverticula, piecemeal polypectomy of hepatic flexure adenomatous polyp, diminutive polyps near hepatic flexure ablated  . CORONARY ANGIOPLASTY WITH STENT PLACEMENT  07/29/2016  . CORONARY STENT INTERVENTION N/A 07/29/2016   Procedure: Coronary Stent Intervention;  Surgeon: Leonie Man, MD;  Location: [facility];  Service: Cardiovascular;  Laterality: N/A;  RCA  . ESOPHAGOGASTRODUODENOSCOPY  2013   erosive reflux esophagitis, s/p empiric dilation. chronic duodenitis.   [name] LEFT HEART CATH AND CORONARY ANGIOGRAPHY  N/A 07/29/2016   Procedure: Left Heart Cath and Coronary Angiography;  Surgeon: Leonie Man, MD;  Location: [facility];  Service: Cardiovascular;  Laterality: N/A;  . NASAL SEPTUM SURGERY    . NASAL SINUS SURGERY     "cleaned out my sinus"  . PACEMAKER IMPLANT N/A 10/15/2019   Procedure: PACEMAKER IMPLANT;  Surgeon: Sanda Klein, MD;  Location: [facility];  Service: Cardiovascular;  Laterality: N/A;  . PERIPHERAL VASCULAR CATHETERIZATION N/A 11/26/2015   Procedure: Abdominal Aortogram w/Lower Extremity;  Surgeon: Wellington Hampshire, MD;  Location: [facility];  Service: Cardiovascular;  Laterality: N/A;  . TONSILLECTOMY    .  TRANSURETHRAL RESECTION OF PROSTATE  2009    FAMILY HISTORY Family History  Problem Relation Age of Onset  . Ovarian cancer Mother   . Diabetes Brother   . Colon cancer Sister 53       deceased from colon cancer  . Diabetes Maternal Grandmother     SOCIAL HISTORY Social History   Tobacco Use  . Smoking status: Former Smoker    Packs/day: 1.00    Years: 15.00    Pack years: 15.00    Types: Cigarettes    Start date: 03/08/1965    Quit date: 07/07/1993    Years since quitting: 27.0  . Smokeless tobacco: Never Used  Vaping Use  . Vaping Use: Never used  Substance Use Topics  . Alcohol use: No    Alcohol/week: 0.0 standard drinks    Comment: 07/29/2016 "last drink was 4 wks ago; had a beer"  . Drug use: No         OPHTHALMIC EXAM: Base Eye Exam    Visual Acuity (ETDRS)      Right Left   Dist [place] 20/20 -1 20/20 -1       Tonometry (Tonopen, 11:12 AM)      Right Left   Pressure 09 10       Pupils      Pupils Dark Light Shape React APD   Right PERRL 4 2 Round Brisk None   Left PERRL 4 2 Round Brisk None       Visual Fields (Counting fingers)      Left Right    Full Full       Extraocular Movement      Right Left    Full Full       Neuro/Psych    Oriented x3: Yes   Mood/Affect: Normal       Dilation     Both eyes: 1.0% Mydriacyl, 2.5% Phenylephrine @ 11:13 AM        Slit Lamp and Fundus Exam    External Exam      Right Left   External Normal Normal       Slit Lamp Exam      Right Left   Lids/Lashes Normal Normal   Conjunctiva/Sclera White and quiet White and quiet   Cornea Clear Clear   Anterior Chamber Deep and quiet Deep and quiet   Iris Round and reactive Round and reactive   Lens Centered posterior chamber intraocular lens Centered posterior chamber intraocular lens   Anterior Vitreous Normal Normal       Fundus Exam      Right Left   Posterior Vitreous Posterior vitreous detachment Normal   Disc Normal Normal   C/D Ratio 0.3 0.35   Macula Microaneurysms, no macular thickening Microaneurysms, no macular thickening   Vessels NPDR- Moderate NPDR- Moderate   Periphery Normal Normal          IMAGING AND PROCEDURES  Imaging and Procedures for 07/22/20  OCT, Retina - OU - Both Eyes       Right Eye Quality was good. Scan locations included subfoveal. Central Foveal Thickness: 272. Progression has been stable. Findings include vitreomacular adhesion .   Left Eye Quality was good. Scan locations included subfoveal. Central Foveal Thickness: 268. Progression has been stable. Findings include vitreomacular adhesion .   Notes No active CSME OU, resolved after treatment last year via antivegF and focal laser                ASSESSMENT/PLAN:  No problem-specific Assessment & Plan notes found for this encounter.      ICD-10-CM   1. 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 Moderate nonproliferative diabetic retinopathy of right eye with macular edema associated with type 2 diabetes mellitus (HCC)  E11.3311 OCT, Retina - OU - Both Eyes  3. 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 Vitreomacular adhesion of right eye  H43.821   5.  Moderate nonproliferative diabetic retinopathy of both eyes without macular edema associated with diabetes mellitus of other type ([place])  C94.4967     1.  OU stable, moderate nonproliferative diabetic retinopathy we will continue to monitor.  2.  3.  Ophthalmic Meds Ordered this visit:  No orders of the defined types were placed in this encounter.      Return in about 6 months (around 01/21/2021) for DILATE OU, OCT, COLOR FP.  There are no Patient Instructions on file for this visit.   Explained the diagnoses, plan, and follow up with the patient and they expressed understanding.  Patient expressed understanding of the importance of proper follow up care.   Clent Demark Jourdan Durbin M.D. Diseases & Surgery of the Retina and Vitreous Retina [facility] Diabetic [place] 07/22/20     Abbreviations: M myopia (nearsighted); A astigmatism; H hyperopia (farsighted); P presbyopia; Mrx spectacle prescription;  CTL contact lenses; OD right eye; OS left eye; OU both eyes  XT exotropia; ET esotropia; PEK punctate epithelial keratitis; PEE punctate epithelial erosions; DES dry eye syndrome; MGD meibomian gland dysfunction; ATs artificial tears; PFAT's preservative free artificial tears; [place] nuclear sclerotic cataract; PSC posterior subcapsular cataract; ERM epi-retinal membrane; PVD posterior vitreous detachment; RD retinal detachment; DM diabetes mellitus; DR diabetic retinopathy; NPDR non-proliferative diabetic retinopathy; PDR proliferative diabetic retinopathy; CSME clinically significant macular edema; DME diabetic macular edema; dbh dot blot hemorrhages; CWS cotton wool spot; POAG primary open angle glaucoma; C/D cup-to-disc ratio; HVF humphrey visual field; GVF goldmann visual field; OCT optical coherence tomography; IOP intraocular pressure; BRVO Branch retinal vein occlusion; CRVO central retinal vein occlusion; CRAO central retinal artery occlusion; BRAO branch retinal artery occlusion; RT retinal tear; SB  scleral buckle; PPV pars plana vitrectomy; VH Vitreous hemorrhage; PRP panretinal laser photocoagulation; IVK intravitreal kenalog; VMT vitreomacular traction; MH Macular hole;  NVD neovascularization of the disc; NVE neovascularization elsewhere; AREDS age related eye disease study; ARMD age related macular degeneration; POAG primary open angle glaucoma; EBMD epithelial/anterior basement membrane dystrophy; ACIOL anterior chamber intraocular lens; IOL intraocular lens; PCIOL posterior chamber intraocular lens; Phaco/IOL phacoemulsification with intraocular lens placement; [place] photorefractive keratectomy; LASIK laser assisted in situ keratomileusis; HTN hypertension; DM diabetes mellitus; COPD chronic obstructive pulmonary disease

## 2020-07-24 DIAGNOSIS — Z23 Encounter for immunization: Secondary | ICD-10-CM | POA: Diagnosis not present

## 2020-08-01 ENCOUNTER — Other Ambulatory Visit (HOSPITAL_COMMUNITY): Payer: Self-pay | Admitting: Cardiovascular Disease

## 2020-08-01 ENCOUNTER — Other Ambulatory Visit: Payer: Self-pay

## 2020-08-01 ENCOUNTER — Ambulatory Visit (HOSPITAL_BASED_OUTPATIENT_CLINIC_OR_DEPARTMENT_OTHER)
Admission: RE | Admit: 2020-08-01 | Discharge: 2020-08-01 | Disposition: A | Discharge status: Home / Self Care | Payer: Medicare PPO | Source: Ambulatory Visit | Attending: Cardiovascular Disease | Admitting: Cardiovascular Disease

## 2020-08-01 ENCOUNTER — Ambulatory Visit (HOSPITAL_COMMUNITY)
Admission: RE | Admit: 2020-08-01 | Discharge: 2020-08-01 | Disposition: A | Discharge status: Home / Self Care | Payer: Medicare PPO | Source: Ambulatory Visit | Attending: Cardiology | Admitting: Cardiology

## 2020-08-01 DIAGNOSIS — Z95828 Presence of other vascular implants and grafts: Secondary | ICD-10-CM

## 2020-08-01 DIAGNOSIS — I739 Peripheral vascular disease, unspecified: Secondary | ICD-10-CM | POA: Insufficient documentation

## 2020-08-06 ENCOUNTER — Encounter: Payer: Self-pay | Admitting: *Deleted

## 2020-08-06 NOTE — Progress Notes (Signed)
Remote pacemaker transmission.   

## 2020-08-28 ENCOUNTER — Telehealth: Payer: Self-pay | Admitting: Endocrinology

## 2020-08-28 ENCOUNTER — Other Ambulatory Visit: Payer: Self-pay

## 2020-08-28 DIAGNOSIS — E119 Type 2 diabetes mellitus without complications: Secondary | ICD-10-CM

## 2020-08-28 MED ORDER — LANTUS SOLOSTAR 100 UNIT/ML ~~LOC~~ SOPN: 20.0000 [IU] | PEN_INJECTOR | SUBCUTANEOUS | 2 refills | Status: DC

## 2020-08-28 NOTE — Telephone Encounter (Signed)
Pharmacy called saying patient switched pharmacies and asked if we could call in his Lantus to them.  PHARMACY -   [facility] [address] [place], [place] PH# - [phone]

## 2020-09-01 DIAGNOSIS — M5459 Other low back pain: Secondary | ICD-10-CM | POA: Diagnosis not present

## 2020-09-01 DIAGNOSIS — M5417 Radiculopathy, lumbosacral region: Secondary | ICD-10-CM | POA: Diagnosis not present

## 2020-09-02 ENCOUNTER — Ambulatory Visit: Payer: Medicare PPO | Admitting: Endocrinology

## 2020-09-02 DIAGNOSIS — H04123 Dry eye syndrome of bilateral lacrimal glands: Secondary | ICD-10-CM | POA: Diagnosis not present

## 2020-09-05 ENCOUNTER — Other Ambulatory Visit: Payer: Self-pay | Admitting: Chiropractic Medicine

## 2020-09-05 DIAGNOSIS — M5417 Radiculopathy, lumbosacral region: Secondary | ICD-10-CM

## 2020-09-08 ENCOUNTER — Other Ambulatory Visit: Payer: Self-pay | Admitting: Cardiovascular Disease

## 2020-09-08 NOTE — Telephone Encounter (Signed)
Refill request

## 2020-09-22 ENCOUNTER — Ambulatory Visit
Admission: RE | Admit: 2020-09-22 | Discharge: 2020-09-22 | Disposition: A | Discharge status: Home / Self Care | Payer: Medicare PPO | Source: Ambulatory Visit | Attending: Chiropractic Medicine | Admitting: Chiropractic Medicine

## 2020-09-22 DIAGNOSIS — M5116 Intervertebral disc disorders with radiculopathy, lumbar region: Secondary | ICD-10-CM | POA: Diagnosis not present

## 2020-09-22 DIAGNOSIS — M5417 Radiculopathy, lumbosacral region: Secondary | ICD-10-CM

## 2020-09-22 DIAGNOSIS — M48061 Spinal stenosis, lumbar region without neurogenic claudication: Secondary | ICD-10-CM | POA: Diagnosis not present

## 2020-09-22 DIAGNOSIS — M5117 Intervertebral disc disorders with radiculopathy, lumbosacral region: Secondary | ICD-10-CM | POA: Diagnosis not present

## 2020-09-22 DIAGNOSIS — M4727 Other spondylosis with radiculopathy, lumbosacral region: Secondary | ICD-10-CM | POA: Diagnosis not present

## 2020-09-30 ENCOUNTER — Telehealth: Payer: Self-pay | Admitting: Endocrinology

## 2020-09-30 ENCOUNTER — Encounter (HOSPITAL_COMMUNITY): Payer: Self-pay

## 2020-09-30 ENCOUNTER — Emergency Department (HOSPITAL_COMMUNITY)
Admission: EM | Admit: 2020-09-30 | Discharge: 2020-09-30 | Disposition: A | Discharge status: Home / Self Care | Payer: Medicare PPO | Attending: Emergency Medicine | Admitting: Emergency Medicine

## 2020-09-30 ENCOUNTER — Other Ambulatory Visit: Payer: Self-pay

## 2020-09-30 DIAGNOSIS — T383X1A Poisoning by insulin and oral hypoglycemic [antidiabetic] drugs, accidental (unintentional), initial encounter: Secondary | ICD-10-CM | POA: Insufficient documentation

## 2020-09-30 DIAGNOSIS — I2510 Atherosclerotic heart disease of native coronary artery without angina pectoris: Secondary | ICD-10-CM | POA: Diagnosis not present

## 2020-09-30 DIAGNOSIS — Z87891 Personal history of nicotine dependence: Secondary | ICD-10-CM | POA: Diagnosis not present

## 2020-09-30 DIAGNOSIS — T50901A Poisoning by unspecified drugs, medicaments and biological substances, accidental (unintentional), initial encounter: Secondary | ICD-10-CM

## 2020-09-30 DIAGNOSIS — T447X1A Poisoning by beta-adrenoreceptor antagonists, accidental (unintentional), initial encounter: Secondary | ICD-10-CM | POA: Insufficient documentation

## 2020-09-30 DIAGNOSIS — E119 Type 2 diabetes mellitus without complications: Secondary | ICD-10-CM | POA: Diagnosis not present

## 2020-09-30 DIAGNOSIS — Z7984 Long term (current) use of oral hypoglycemic drugs: Secondary | ICD-10-CM | POA: Diagnosis not present

## 2020-09-30 DIAGNOSIS — Z794 Long term (current) use of insulin: Secondary | ICD-10-CM | POA: Insufficient documentation

## 2020-09-30 DIAGNOSIS — Z7982 Long term (current) use of aspirin: Secondary | ICD-10-CM | POA: Insufficient documentation

## 2020-09-30 DIAGNOSIS — Z79899 Other long term (current) drug therapy: Secondary | ICD-10-CM | POA: Diagnosis not present

## 2020-09-30 DIAGNOSIS — T39011A Poisoning by aspirin, accidental (unintentional), initial encounter: Secondary | ICD-10-CM | POA: Diagnosis not present

## 2020-09-30 DIAGNOSIS — I10 Essential (primary) hypertension: Secondary | ICD-10-CM | POA: Diagnosis not present

## 2020-09-30 DIAGNOSIS — Z950 Presence of cardiac pacemaker: Secondary | ICD-10-CM | POA: Diagnosis not present

## 2020-09-30 LAB — CBC WITH DIFFERENTIAL/PLATELET
Abs Immature Granulocytes: 0.05 10*3/uL (ref 0.00–0.07)
Basophils Absolute: 0.1 10*3/uL (ref 0.0–0.1)
Basophils Relative: 1 %
Eosinophils Absolute: 0.5 10*3/uL (ref 0.0–0.5)
Eosinophils Relative: 5 %
HCT: 43.2 % (ref 39.0–52.0)
Hemoglobin: 14.3 g/dL (ref 13.0–17.0)
Immature Granulocytes: 1 %
Lymphocytes Relative: 18 %
Lymphs Abs: 1.7 10*3/uL (ref 0.7–4.0)
MCH: 34.8 pg — ABNORMAL HIGH (ref 26.0–34.0)
MCHC: 33.1 g/dL (ref 30.0–36.0)
MCV: 105.1 fL — ABNORMAL HIGH (ref 80.0–100.0)
Monocytes Absolute: 0.5 10*3/uL (ref 0.1–1.0)
Monocytes Relative: 5 %
Neutro Abs: 6.6 10*3/uL (ref 1.7–7.7)
Neutrophils Relative %: 70 %
Platelets: 271 10*3/uL (ref 150–400)
RBC: 4.11 MIL/uL — ABNORMAL LOW (ref 4.22–5.81)
RDW: 13 % (ref 11.5–15.5)
WBC: 9.4 10*3/uL (ref 4.0–10.5)
nRBC: 0 % (ref 0.0–0.2)

## 2020-09-30 LAB — BASIC METABOLIC PANEL
Anion gap: 9 (ref 5–15)
BUN: 19 mg/dL (ref 8–23)
CO2: 26 mmol/L (ref 22–32)
Calcium: 8.9 mg/dL (ref 8.9–10.3)
Chloride: 101 mmol/L (ref 98–111)
Creatinine, Ser: 0.81 mg/dL (ref 0.61–1.24)
GFR, Estimated: >60 mL/min (ref >60)
Glucose, Bld: 267 mg/dL — ABNORMAL HIGH (ref 70–99)
Potassium: 4.3 mmol/L (ref 3.5–5.1)
Sodium: 136 mmol/L (ref 135–145)

## 2020-09-30 LAB — CBG MONITORING, ED: Glucose-Capillary: 277 mg/dL — ABNORMAL HIGH (ref 70–99)

## 2020-09-30 NOTE — ED Provider Notes (Signed)
[facility] EMERGENCY DEPARTMENT Provider Note   CSN: [id number] Arrival date & time: 09/30/20  [id number]     History Chief Complaint  Patient presents with   Drug Overdose    Marcus Norton is a 80 y.o. male.  Patient presents to the emergency department for evaluation of accidental overdose.  Patient reports that he took his evening meds earlier tonight.  This would have been Lopressor 50 mg, aspirin, metformin 1000 mg.  He went to bed tonight and then apparently woke up at midnight and accidentally took his Tuesday and Wednesday morning medication doses.  This would have been Norvasc 5 mg x 2, Rybesus 14 mg x 2, Lopressor 50 mg x 1, metformin 1000 mg x 2.  Patient reports that he feels flushed and dizzy.      Past Medical History:  Diagnosis Date   Adenomatous colon polyp 2000/2010   Arthritis    "lower back" (07/29/2016)   ASCVD (arteriosclerotic cardiovascular disease)    -luminal irregularities in 1998 and 2005; normal EF   Benign prostatic hypertrophy    status post transurethral resection of the prostate   CAD (coronary artery disease)    a. Cath 07/29/16 99% ostial RCA & 70% pRCA s/p cutting balloon angioplasty and single SYNERGY DES 2.5X28. 100% very Distal 1st RPLB lesion --> the occlusion site moved further distal with guidewire advancement;  40% ostial LAD; 50% Ost Cx to Prox Cx lesion.   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   Bilateral carotid artery stenosis 10/01/2019   Coronary artery disease of native artery of native heart with stable angina pectoris ([place]) 10/01/2019   Aortic valve stenosis, nonrheumatic 10/01/2019   DM (diabetes mellitus) type II uncontrolled, periph vascular disorder ([place]) 10/01/2019   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 associated with type 2 diabetes mellitus ([place]) 06/11/2019   Moderate nonproliferative diabetic retinopathy of right eye with macular edema ([place]) 06/11/2019   Retinal hemorrhage of right eye 06/11/2019   Vitreomacular adhesion of both eyes 06/11/2019   Vitreomacular adhesion of right eye 06/11/2019   Chronic rhinitis 09/11/2018   OSA (obstructive sleep apnea) 07/12/2018   Restless leg syndrome 07/12/2018   PSVT (paroxysmal supraventricular tachycardia) ([place]) 03/10/2018   Visit for preventive health examination 03/10/2018   Atypical chest pain 03/10/2018   Dysphagia 03/10/2018   Chronic cough 12/20/2017   CAD S/P percutaneous coronary angioplasty 07/20/2017   Exertional dyspnea 07/15/2017   GERD (gastroesophageal reflux disease) 04/14/2017   Hematuria 03/08/2017   Encounter for Medicare annual wellness exam 03/08/2017   Need for pneumococcal vaccination 03/08/2017   Status post coronary artery stent placement    Atypical angina ([place]) 07/29/2016   Abnormal nuclear stress test 07/29/2016   Constipation 05/12/2016   History of adenomatous polyp of colon 05/12/2016   Generalized anxiety disorder  03/04/2016   PAD (peripheral artery disease) ([place]) 02/26/2014   Mixed hyperlipidemia 07/11/2009   ERECTILE DYSFUNCTION, 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   Abnormal liver function tests 07/11/2009    Past Surgical History:  Procedure Laterality Date   APPENDECTOMY     CATARACT EXTRACTION W/ INTRAOCULAR LENS  IMPLANT, BILATERAL Bilateral    COLONOSCOPY  07/20/2011   Dr. Gala Romney: multiple  hyperplastic polyps. Surveillance 2018   COLONOSCOPY N/A 06/30/2016   Procedure: COLONOSCOPY;  Surgeon: Daneil Dolin, MD;  [facility];  Service: Endoscopy;  Laterality: N/A;  10:00am   COLONOSCOPY W/ BIOPSIES AND POLYPECTOMY  07/08/08, 06/2011   Dr Madolyn Frieze papilla, diminutive rectal polyp ablated the, left-sided diverticula, piecemeal polypectomy of hepatic flexure adenomatous polyp, diminutive polyps near hepatic flexure ablated   CORONARY ANGIOPLASTY WITH STENT PLACEMENT  07/29/2016   CORONARY STENT INTERVENTION N/A 07/29/2016   Procedure: Coronary Stent Intervention;  Surgeon: Leonie Man, MD;  Location: [place] [facility];  Service: Cardiovascular;  Laterality: N/A;  RCA   ESOPHAGOGASTRODUODENOSCOPY  2013   erosive reflux esophagitis, s/p empiric dilation. chronic duodenitis.    LEFT HEART CATH AND CORONARY ANGIOGRAPHY N/A 07/29/2016   Procedure: Left Heart Cath and Coronary Angiography;  Surgeon: Leonie Man, MD;  Location: [place] [facility];  Service: Cardiovascular;  Laterality: N/A;   NASAL SEPTUM SURGERY     NASAL SINUS SURGERY     "cleaned out my sinus"   PACEMAKER IMPLANT N/A 10/15/2019   Procedure: PACEMAKER IMPLANT;  Surgeon: Sanda Klein, MD;  Location: [place] [facility];  Service: Cardiovascular;  Laterality: N/A;   PERIPHERAL VASCULAR CATHETERIZATION N/A 11/26/2015   Procedure: Abdominal Aortogram w/Lower Extremity;  Surgeon: Wellington Hampshire, MD;  Location: [place] [facility];  Service: Cardiovascular;  Laterality: N/A;   TONSILLECTOMY     TRANSURETHRAL RESECTION OF PROSTATE  2009       Family History  Problem Relation Age of Onset   Ovarian cancer Mother    Diabetes Brother    Colon cancer Sister 90       deceased from colon cancer   Diabetes Maternal Grandmother     Social History   Tobacco Use   Smoking status: Former    Packs/day: 1.00    Years: 15.00    Pack years: 15.00    Types: Cigarettes    Start date: 03/08/1965    Quit  date: 07/07/1993    Years since quitting: 27.2   Smokeless tobacco: Never  Vaping Use   Vaping Use: Never used  Substance Use Topics   Alcohol use: No    Alcohol/week: 0.0 standard drinks    Comment: 07/29/2016 "last drink was 4 wks ago; had a beer"   Drug use: No    Home Medications Prior to Admission medications   Medication Sig Start Date End Date Taking? Authorizing Provider  ACCU-CHEK AVIVA PLUS test strip  10/20/19   [provider]  alfuzosin (UROXATRAL) 10 MG 24 hr tablet Take 10 mg by mouth daily with breakfast.    [provider]  amLODipine (NORVASC) 5 MG tablet Take 1 tablet (5 mg total) by mouth daily. 10/30/19   Wellington Hampshire, MD  aspirin EC 81 MG tablet Take 81 mg by mouth at bedtime.     [provider]  atorvastatin (LIPITOR) 10 MG tablet Take 1 tablet (10 mg total) by mouth once a week. Patient taking differently: Take 10 mg by mouth every Wednesday. 09/11/18   [name]  A, MD  Cholecalciferol (VITAMIN D3) 50 MCG (2000 UT) TABS Take 2,000 Units by mouth daily.    [provider]  dapagliflozin propanediol (FARXIGA) 5 MG TABS tablet Take 1 tablet (5 mg total) by mouth daily. 05/27/20   Renato Shin, MD  fluticasone furoate-vilanterol (BREO ELLIPTA) 100-25 MCG/INH AEPB Inhale 1 puff into the lungs daily. 08/28/19   Olalere, Cicero Duck A, MD  insulin glargine (LANTUS SOLOSTAR) 100 UNIT/ML Solostar Pen Inject 20 Units into the skin every morning. 08/28/20   Renato Shin, MD  Insulin Pen Needle (PEN NEEDLES) 32G X 4 MM MISC To use daily with Lantus solostar. Dx E11.9 12/20/19   [name], PA-C  metFORMIN (GLUCOPHAGE-XR) 500 MG 24 hr tablet Take 4 tablets (2,000 mg total) by mouth daily. 02/05/20   Renato Shin, MD  metoprolol tartrate (LOPRESSOR) 50 MG tablet Take 1 tablet (50 mg total) by mouth 2 (two) times daily. 10/15/19   Croitoru, Mihai, MD  montelukast (SINGULAIR) 10 MG tablet Take 1 tablet (10 mg total) by mouth at bedtime.  04/30/19   Olalere, Cicero Duck A, MD  MULTIPLE VITAMIN PO Take 1 tablet by mouth daily.    [provider]  Multiple Vitamins-Minerals (HAIR SKIN AND NAILS FORMULA) TABS Take 1 tablet by mouth daily. Gummie    [provider]  nitroGLYCERIN (NITROSTAT) 0.4 MG SL tablet Place 1 tablet (0.4 mg total) under the tongue every 5 (five) minutes as needed for chest pain. 07/03/18   [name], PA-C  pantoprazole (PROTONIX) 40 MG tablet TAKE 1 TABLET(40 MG) BY MOUTH DAILY 01/22/20   [name], PA-C  REPATHA SURECLICK [id number] MG/ML SOAJ ADMINISTER 1 ML UNDER THE SKIN EVERY 14 DAYS 09/08/20   Wellington Hampshire, MD  Semaglutide (RYBELSUS) 14 MG TABS Take 14 mg by mouth daily. 03/25/20   Renato Shin, MD  Suvorexant 10 MG TABS Take 10 mg by mouth at bedtime. 01/30/20   [name], PA-C  tadalafil (CIALIS) 20 MG tablet Take 0.5-1 tablets (10-20 mg total) by mouth every other day as needed for erectile dysfunction. 03/08/17   [name], PA-C  vitamin C (ASCORBIC ACID) 250 MG tablet Take 500 mg by mouth daily. Gummie    [provider]    Allergies    Plavix [clopidogrel bisulfate], Oxycontin [oxycodone hcl], Penicillins, Pioglitazone, Prednisone, and Simvastatin  Review of Systems   Review of Systems  Neurological:  Positive for dizziness.  All other systems reviewed and are negative.  Physical Exam Updated Vital Signs BP 123/66   Pulse 80   Temp 97.8 F (36.6 C) (Oral)   Resp 14   Ht '5\' 7"'$  (1.702 m)   Wt 78.9 kg   SpO2 97%   BMI 27.25 kg/m   Physical Exam Vitals and nursing note reviewed.  Constitutional:      General: He is not in acute distress.    Appearance: Normal appearance. He is well-developed.  HENT:     Head: Normocephalic and atraumatic.     Right Ear: Hearing normal.     Left Ear: Hearing normal.     Nose: Nose normal.  Eyes:     Conjunctiva/sclera: Conjunctivae normal.     Pupils: Pupils are equal, round, and reactive to light.   Cardiovascular:     Rate and Rhythm: Regular rhythm.     Heart sounds: S1 normal and S2 normal. No murmur heard.   No friction rub. No gallop.  Pulmonary:  Effort: Pulmonary effort is normal. No respiratory distress.     Breath sounds: Normal breath sounds.  Chest:     Chest wall: No tenderness.  Abdominal:     General: Bowel sounds are normal.     Palpations: Abdomen is soft.     Tenderness: There is no abdominal tenderness. There is no guarding or rebound. Negative signs include Murphy's sign and McBurney's sign.     Hernia: No hernia is present.  Musculoskeletal:        General: Normal range of motion.     Cervical back: Normal range of motion and neck supple.  Skin:    General: Skin is warm and dry.     Findings: No rash.  Neurological:     Mental Status: He is alert and oriented to person, place, and time.     GCS: GCS eye subscore is 4. GCS verbal subscore is 5. GCS motor subscore is 6.     Cranial Nerves: No cranial nerve deficit.     Sensory: No sensory deficit.     Coordination: Coordination normal.  Psychiatric:        Speech: Speech normal.        Behavior: Behavior normal.        Thought Content: Thought content normal.    ED Results / Procedures / Treatments   Labs (all labs ordered are listed, but only abnormal results are displayed) Labs Reviewed  CBC WITH DIFFERENTIAL/PLATELET - Abnormal; Notable for the following components:      Result Value   RBC 4.11 (*)    MCV 105.1 (*)    MCH 34.8 (*)    All other components within normal limits  BASIC METABOLIC PANEL - Abnormal; Notable for the following components:   Glucose, Bld 267 (*)    All other components within normal limits  CBG MONITORING, ED - Abnormal; Notable for the following components:   Glucose-Capillary 277 (*)    All other components within normal limits    EKG EKG Interpretation  Date/Time:  Tuesday September 30 2020 02:47:02 EDT Ventricular Rate:  94 PR Interval:  177 QRS  Duration: 116 QT Interval:  370 QTC Calculation: 463 R Axis:   -45 Text Interpretation: Sinus rhythm Incomplete RBBB and LAFB Anteroseptal infarct, old Baseline wander in lead(s) V5 Confirmed by Orpah Greek [phone]) on 09/30/2020 3:21:51 AM  Radiology No results found.  Procedures Procedures   Medications Ordered in ED Medications - No data to display  ED Course  I have reviewed the triage vital signs and the nursing notes.  Pertinent labs & imaging results that were available during my care of the patient were reviewed by me and considered in my medical decision making (see chart for details).    MDM Rules/Calculators/A&P                          Patient presents to the emergency department after accidentally taking additional doses of his routine medications.  Patient took extra blood pressure and diabetes oral medications.  Patient has been monitored through the night.  Blood sugar and vital signs have remained stable.  [place] has been consulted and they feel that the patient can be successfully and safely discharged.   Final Clinical Impression(s) / ED Diagnoses Final diagnoses:  Accidental drug overdose, initial encounter    Rx / DC Orders ED Discharge Orders     None  Orpah Greek, MD 09/30/20 [phone]

## 2020-09-30 NOTE — ED Notes (Signed)
Pt placed on cardiac monitor with BP to set cycle every 30 minutes. Continuous pulse oximeter applied.  

## 2020-09-30 NOTE — Telephone Encounter (Signed)
Pt is completely out of his test strips and is needing a medication refill ACCU-CHEK AVIVA PLUS test strip   [id number]- [place], [place] - [address]

## 2020-09-30 NOTE — ED Notes (Addendum)
Lennette Bihari with [facility] notified of unintentional overdose by pt of: Norvasc '10mg'$  Metoprolol '100mg'$  Metformin '4000mg'$  Semaglutide '28mg'$   Lennette Bihari advises that pt could have watched for side effects at home, it is highly unlikely that Metformin and Semaglutide will cause hypoglycemia as long as pt eats regular meals. Norvasc and Metoprolol are short acting and last about 6 hours, pt can go home if he has someone to keep an eye on him or remain in ED until 6 hours after ingestion.  Dr. Betsey Holiday notified.

## 2020-09-30 NOTE — ED Triage Notes (Signed)
Pt states he took 2 dosed of morning meds around midnight. Pt states he feels flushed and dizzy.

## 2020-10-01 ENCOUNTER — Other Ambulatory Visit: Payer: Self-pay

## 2020-10-01 DIAGNOSIS — N401 Enlarged prostate with lower urinary tract symptoms: Secondary | ICD-10-CM | POA: Diagnosis not present

## 2020-10-01 DIAGNOSIS — R351 Nocturia: Secondary | ICD-10-CM | POA: Diagnosis not present

## 2020-10-01 DIAGNOSIS — E119 Type 2 diabetes mellitus without complications: Secondary | ICD-10-CM

## 2020-10-01 DIAGNOSIS — N5201 Erectile dysfunction due to arterial insufficiency: Secondary | ICD-10-CM | POA: Diagnosis not present

## 2020-10-01 MED ORDER — ACCU-CHEK AVIVA PLUS VI STRP: ORAL_STRIP | 12 refills | Status: DC

## 2020-10-02 DIAGNOSIS — M5416 Radiculopathy, lumbar region: Secondary | ICD-10-CM | POA: Diagnosis not present

## 2020-10-07 ENCOUNTER — Ambulatory Visit: Payer: Medicare PPO | Admitting: Endocrinology

## 2020-10-07 ENCOUNTER — Encounter: Payer: Self-pay | Admitting: Cardiovascular Disease

## 2020-10-07 ENCOUNTER — Other Ambulatory Visit: Payer: Self-pay

## 2020-10-07 ENCOUNTER — Ambulatory Visit: Payer: Medicare PPO | Admitting: Cardiovascular Disease

## 2020-10-07 VITALS — BP 130/62 | HR 85 | Ht 67.0 in | Wt 177.4 lb

## 2020-10-07 VITALS — BP 152/70 | HR 80 | Ht 67.0 in | Wt 176.4 lb

## 2020-10-07 DIAGNOSIS — I2510 Atherosclerotic heart disease of native coronary artery without angina pectoris: Secondary | ICD-10-CM

## 2020-10-07 DIAGNOSIS — E119 Type 2 diabetes mellitus without complications: Secondary | ICD-10-CM

## 2020-10-07 DIAGNOSIS — I739 Peripheral vascular disease, unspecified: Secondary | ICD-10-CM

## 2020-10-07 DIAGNOSIS — E785 Hyperlipidemia, unspecified: Secondary | ICD-10-CM | POA: Diagnosis not present

## 2020-10-07 DIAGNOSIS — I6529 Occlusion and stenosis of unspecified carotid artery: Secondary | ICD-10-CM | POA: Diagnosis not present

## 2020-10-07 DIAGNOSIS — I350 Nonrheumatic aortic (valve) stenosis: Secondary | ICD-10-CM | POA: Diagnosis not present

## 2020-10-07 LAB — POCT GLYCOSYLATED HEMOGLOBIN (HGB A1C): Hemoglobin A1C: 7.3 % — AB (ref 4.0–5.6)

## 2020-10-07 MED ORDER — LANTUS SOLOSTAR 100 UNIT/ML ~~LOC~~ SOPN: 28.0000 [IU] | PEN_INJECTOR | SUBCUTANEOUS | 2 refills | Status: DC

## 2020-10-07 NOTE — Progress Notes (Signed)
Cardiology Office Note   Date:  10/07/2020   ID:  Cyree, Hilinski 1940-03-27, MRN [id number]  PCP:  Tomasa Hose, NP  Cardiologist: Dr. Fletcher Anon  No chief complaint on file.     History of Present Illness: Marcus Norton is a 80 y.o. male who presents for a follow-up visit regarding peripheral arterial disease, PSVT, carotid disease and aortic stenosis.   He has known history of  paroxysmal supraventricular tachycardia,  coronary artery disease, aortic stenosis, type 2 diabetes since 1990, hypertension and hyperlipidemia. He has known history of claudication . He did not tolerate cilostazol due to palpitations. He is s/p bilateral common iliac artery kissing stent placement in 11/2015. 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   He had abnormal nuclear stress test in 2018.  Cardiac catheterization in June, 2018 showed severe ostial RCA stenosis and occluded RPL2 with nonobstructive disease affecting the left system.  He underwent successful angioplasty and drug-eluting stent placement to the ostial right coronary artery. He has known history of intolerance to statins and was able to tolerate only small dose of atorvastatin and Repatha.   Was recent echocardiogram in June 2021 showed normal LV systolic function, mild mitral regurgitation and moderate aortic stenosis with a valve area of 1.22 cm. An outpatient monitor was done in 2021 due to episodes of dizziness.  It showed frequent episodes of SVT as well as a 3-second pause.  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  He underwent dual-chamber pacemaker placement in August by Dr. Sallyanne Kuster.   Episodes of dizziness improved significantly since then and he has been doing reasonably well.  He denies chest pain or worsening dyspnea.  No significant leg claudication.   Past  Medical History:  Diagnosis Date   Adenomatous colon polyp 2000/2010   Arthritis    "lower back" (07/29/2016)   ASCVD (arteriosclerotic cardiovascular disease)    -luminal irregularities in 1998 and 2005; normal EF   Benign prostatic hypertrophy    status post transurethral resection of the prostate   CAD (coronary artery disease)    a. Cath 07/29/16 99% ostial RCA & 70% pRCA s/p cutting balloon angioplasty and single SYNERGY DES 2.5X28. 100% very Distal 1st RPLB lesion --> the occlusion site moved further distal with guidewire advancement;  40% ostial LAD; 50% Ost Cx to Prox Cx lesion.   Coronary artery disease    Dysphagia    Erectile dysfunction    GERD (gastroesophageal reflux disease)    Heart murmur    History of hiatal hernia    History of kidney stones    Hyperlipidemia    Hypertension    Seasonal allergies    Tobacco abuse    20 pack years; discontinued 1995   Type II diabetes mellitus ([place])     Past Surgical History:  Procedure Laterality Date   APPENDECTOMY     CATARACT EXTRACTION W/ INTRAOCULAR LENS  IMPLANT, BILATERAL Bilateral    COLONOSCOPY  07/20/2011   Dr. Gala Romney: multiple hyperplastic polyps. Surveillance 2018   COLONOSCOPY N/A 06/30/2016   Procedure: COLONOSCOPY;  Surgeon: Daneil Dolin, MD;  [facility];  Service: Endoscopy;  Laterality: N/A;  10:00am   COLONOSCOPY W/ BIOPSIES AND POLYPECTOMY  07/08/08, 06/2011   Dr Madolyn Frieze papilla, diminutive rectal polyp ablated the, left-sided diverticula, piecemeal polypectomy of hepatic flexure adenomatous  polyp, diminutive polyps near hepatic flexure ablated   CORONARY ANGIOPLASTY WITH STENT PLACEMENT  07/29/2016   CORONARY STENT INTERVENTION N/A 07/29/2016   Procedure: Coronary Stent Intervention;  Surgeon: Leonie Man, MD;  Location: [place] [facility];  Service: Cardiovascular;  Laterality: N/A;  RCA   ESOPHAGOGASTRODUODENOSCOPY  2013   erosive reflux esophagitis, s/p empiric dilation. chronic  duodenitis.    LEFT HEART CATH AND CORONARY ANGIOGRAPHY N/A 07/29/2016   Procedure: Left Heart Cath and Coronary Angiography;  Surgeon: Leonie Man, MD;  Location: [place] [facility];  Service: Cardiovascular;  Laterality: N/A;   NASAL SEPTUM SURGERY     NASAL SINUS SURGERY     "cleaned out my sinus"   PACEMAKER IMPLANT N/A 10/15/2019   Procedure: PACEMAKER IMPLANT;  Surgeon: Sanda Klein, MD;  Location: [place] [facility];  Service: Cardiovascular;  Laterality: N/A;   PERIPHERAL VASCULAR CATHETERIZATION N/A 11/26/2015   Procedure: Abdominal Aortogram w/Lower Extremity;  Surgeon: Wellington Hampshire, MD;  Location: [place] [facility];  Service: Cardiovascular;  Laterality: N/A;   TONSILLECTOMY     TRANSURETHRAL RESECTION OF PROSTATE  2009     Current Outpatient Medications  Medication Sig Dispense Refill   ACCU-CHEK AVIVA PLUS test strip Use to check BS 2x a day 100 each 12   alfuzosin (UROXATRAL) 10 MG 24 hr tablet Take 10 mg by mouth daily with breakfast.     amLODipine (NORVASC) 5 MG tablet Take 1 tablet (5 mg total) by mouth daily. 90 tablet 1   aspirin EC 81 MG tablet Take 81 mg by mouth at bedtime.      atorvastatin (LIPITOR) 10 MG tablet Take 1 tablet (10 mg total) by mouth once a week. (Patient taking differently: Take 10 mg by mouth every Wednesday.) 90 tablet 1   Cholecalciferol (VITAMIN D3) 50 MCG (2000 UT) TABS Take 2,000 Units by mouth daily.     fluticasone furoate-vilanterol (BREO ELLIPTA) 100-25 MCG/INH AEPB Inhale 1 puff into the lungs daily. 180 each 2   insulin glargine (LANTUS SOLOSTAR) 100 UNIT/ML Solostar Pen Inject 28 Units into the skin every morning. 15 mL 2   Insulin Pen Needle (PEN NEEDLES) 32G X 4 MM MISC To use daily with Lantus solostar. Dx E11.9 100 each 1   metFORMIN (GLUCOPHAGE-XR) 500 MG 24 hr tablet Take 4 tablets (2,000 mg total) by mouth daily. 360 tablet 3   metoprolol tartrate (LOPRESSOR) 50 MG tablet Take 1 tablet (50 mg total) by mouth 2 (two)  times daily. 180 tablet 3   montelukast (SINGULAIR) 10 MG tablet Take 1 tablet (10 mg total) by mouth at bedtime. 30 tablet 11   MULTIPLE VITAMIN PO Take 1 tablet by mouth daily.     Multiple Vitamins-Minerals (HAIR SKIN AND NAILS FORMULA) TABS Take 1 tablet by mouth daily. Gummie     nitroGLYCERIN (NITROSTAT) 0.4 MG SL tablet Place 1 tablet (0.4 mg total) under the tongue every 5 (five) minutes as needed for chest pain. 5 tablet 0   pantoprazole (PROTONIX) 40 MG tablet TAKE 1 TABLET(40 MG) BY MOUTH DAILY 90 tablet 1   REPATHA SURECLICK [id number] MG/ML SOAJ ADMINISTER 1 ML UNDER THE SKIN EVERY 14 DAYS 2 mL 11   Semaglutide (RYBELSUS) 14 MG TABS Take 14 mg by mouth daily. 90 tablet 3   Suvorexant 10 MG TABS Take 10 mg by mouth at bedtime. 30 tablet 0   tadalafil (CIALIS) 20 MG tablet Take 0.5-1 tablets (10-20 mg total) by  mouth every other day as needed for erectile dysfunction. 5 tablet 1   vitamin C (ASCORBIC ACID) 250 MG tablet Take 500 mg by mouth daily. Gummie     No current facility-administered medications for this visit.    Allergies:   Plavix [clopidogrel bisulfate], Oxycontin [oxycodone hcl], Penicillins, Pioglitazone, Prednisone, and Simvastatin    Social History:  The patient  reports that he quit smoking about 27 years ago. His smoking use included cigarettes. He started smoking about 55 years ago. He has a 15.00 pack-year smoking history. He has never used smokeless tobacco. He reports that he does not drink alcohol and does not use drugs.   Family History:  The patient's family history includes Colon cancer (age of onset: 83) in his sister; Diabetes in his brother and maternal grandmother; Ovarian cancer in his mother.    ROS:  Please see the history of present illness.   Otherwise, review of systems are positive for none.   All other systems are reviewed and negative.    PHYSICAL EXAM: VS:  BP 130/62   Pulse 85   Ht '5\' 7"'$  (1.702 m)   Wt 177 lb 6.4 oz (80.5 kg)   SpO2 94%    BMI 27.78 kg/m  , BMI Body mass index is 27.78 kg/m. GEN: Well nourished, well developed, in no acute distress  HEENT: normal  Neck: no JVD, carotid bruits, or masses Cardiac: RRR; no  rubs, or gallops,no edema . 2/6 crescendo decrescendo systolic murmur in the aortic area which is mid peaking Respiratory:  clear to auscultation bilaterally, normal work of breathing GI: soft, nontender, nondistended, + BS MS: no deformity or atrophy  Skin: warm and dry, no rash Neuro:  Strength and sensation are intact Psych: euthymic mood, full affect Vascular: Femoral pulse: Normal bilaterally.  Distal pulses are not palpable     Recent Labs: 12/20/2019: ALT 27 09/30/2020: BUN 19; Creatinine, Ser 0.81; Hemoglobin 14.3; Platelets 271; Potassium 4.3; Sodium 136    Lipid Panel    Component Value Date/Time   CHOL 57 12/20/2019 0903   CHOL 75 (L) 02/06/2019 1052   TRIG 50.0 12/20/2019 0903   TRIG 96 03/12/2008 0000   HDL 34.50 (L) 12/20/2019 0903   HDL 37 (L) 02/06/2019 1052   CHOLHDL 2 12/20/2019 0903   VLDL 10.0 12/20/2019 0903   LDLCALC 12 12/20/2019 0903   LDLCALC 24 02/06/2019 1052   LDLCALC 91 03/12/2008 0000      Wt Readings from Last 3 Encounters:  10/07/20 177 lb 6.4 oz (80.5 kg)  10/07/20 176 lb 6.4 oz (80 kg)  09/30/20 174 lb (78.9 kg)       No flowsheet data found.    ASSESSMENT AND PLAN:  1.  Peripheral arterial disease: No significant claudication.  I reviewed most recent Doppler studies with him which were done in June and showed patent iliac stents with stable ABI.  Repeat studies in June of next year.    2. Coronary artery disease involving native coronary arteries without angina: Continue aggressive medical therapy.    3. Hyperlipidemia: He is currently on atorvastatin and Repatha with most recent LDL of 12.  4. Essential hypertension: Blood pressure is well controlled after increasing amlodipine during last visit.  5.  Moderate aortic stenosis: I requested  a follow-up echocardiogram.  6.  Tachycardia-bradycardia syndrome, status post dual-chamber pacemaker placement with improvement in symptoms.  7.  Moderate bilateral carotid disease: This was recently found on CT. I requested a follow-up carotid  Doppler.    Disposition:   FU with me in 6 months  Signed,  [name], MD  10/07/2020 11:42 AM    [place]

## 2020-10-07 NOTE — Progress Notes (Signed)
Subjective:    Patient ID: Marcus Norton, male    DOB: [birth year], 80 y.o.   MRN: [id number]  HPI Pt returns for f/u of diabetes mellitus: DM type: Insulin-requiring type 2 Dx'ed: 0000000 Complications: DR, CAD, and PAD Therapy: insulin since 2009, and 2 oral meds.   DKA: never Severe hypoglycemia: never Pancreatitis: never Pancreatic imaging: normal on 2017 CT SDOH: none.   Other: he takes QD insulin, at least for now.   Interval history: no cbg record, but states cbg's are in the mid-100's.  There is no trend throughout the day.  He stopped farxiga, due to diarrhea.  He takes Lantus, 28/d.   Past Medical History:  Diagnosis Date   Adenomatous colon polyp 2000/2010   Arthritis    "lower back" (07/29/2016)   ASCVD (arteriosclerotic cardiovascular disease)    -luminal irregularities in 1998 and 2005; normal EF   Benign prostatic hypertrophy    status post transurethral resection of the prostate   CAD (coronary artery disease)    a. Cath 07/29/16 99% ostial RCA & 70% pRCA s/p cutting balloon angioplasty and single SYNERGY DES 2.5X28. 100% very Distal 1st RPLB lesion --> the occlusion site moved further distal with guidewire advancement;  40% ostial LAD; 50% Ost Cx to Prox Cx lesion.   Coronary artery disease    Dysphagia    Erectile dysfunction    GERD (gastroesophageal reflux disease)    Heart murmur    History of hiatal hernia    History of kidney stones    Hyperlipidemia    Hypertension    Seasonal allergies    Tobacco abuse    20 pack years; discontinued 1995   Type II diabetes mellitus ([place])     Past Surgical History:  Procedure Laterality Date   APPENDECTOMY     CATARACT EXTRACTION W/ INTRAOCULAR LENS  IMPLANT, BILATERAL Bilateral    COLONOSCOPY  07/20/2011   Dr. Gala Romney: multiple hyperplastic polyps. Surveillance 2018   COLONOSCOPY N/A 06/30/2016   Procedure: COLONOSCOPY;  Surgeon: Daneil Dolin, MD;  Location: [facility];  Service: Endoscopy;  Laterality: N/A;   10:00am   COLONOSCOPY W/ BIOPSIES AND POLYPECTOMY  07/08/08, 06/2011   Dr Madolyn Frieze papilla, diminutive rectal polyp ablated the, left-sided diverticula, piecemeal polypectomy of hepatic flexure adenomatous polyp, diminutive polyps near hepatic flexure ablated   CORONARY ANGIOPLASTY WITH STENT PLACEMENT  07/29/2016   CORONARY STENT INTERVENTION N/A 07/29/2016   Procedure: Coronary Stent Intervention;  Surgeon: Leonie Man, MD;  Location: [facility];  Service: Cardiovascular;  Laterality: N/A;  RCA   ESOPHAGOGASTRODUODENOSCOPY  2013   erosive reflux esophagitis, s/p empiric dilation. chronic duodenitis.    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   Procedure: PACEMAKER IMPLANT;  Surgeon: Sanda Klein, MD;  Location: [facility];  Service: Cardiovascular;  Laterality: N/A;   PERIPHERAL VASCULAR CATHETERIZATION N/A 11/26/2015   Procedure: Abdominal Aortogram w/Lower Extremity;  Surgeon: Wellington Hampshire, MD;  Location: [facility];  Service: Cardiovascular;  Laterality: N/A;   TONSILLECTOMY     TRANSURETHRAL RESECTION OF PROSTATE  2009    Social History   Socioeconomic History  Marital status: Married    Spouse name: Not on file   Number of children: 2   Years of education: Not on file   Highest education level: Not on file  Occupational History   Occupation: Dietitian, [place]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    Comment: [employer]  Tobacco Use   Smoking status: Former    Packs/day: 1.00    Years: 15.00    Pack years: 15.00    Types: Cigarettes    Start date: 03/08/1965    Quit date: 07/07/1993    Years since quitting: 27.2   Smokeless tobacco: Never  Vaping Use    Vaping Use: Never used  Substance and Sexual Activity   Alcohol use: No    Alcohol/week: 0.0 standard drinks    Comment: 07/29/2016 "last drink was 4 wks ago; had a beer"   Drug use: No   Sexual activity: Not Currently  Other Topics Concern   Not on file  Social History Narrative   5 grandchildren    Social Determinants of Health   Financial Resource Strain: Low Risk    Difficulty of Paying Living Expenses: Not hard at all  Food Insecurity: No Food Insecurity   Worried About Charity fundraiser in the Last Year: Never true   [place] in the Last Year: Never true  Transportation Needs: No Transportation Needs   Lack of Transportation (Medical): No   Lack of Transportation (Non-Medical): No  Physical Activity: Sufficiently Active   Days of Exercise per Week: 4 days   Minutes of Exercise per Session: 40 min  Stress: No Stress Concern Present   Feeling of Stress : Not at all  Social Connections: Moderately Integrated   Frequency of Communication with Friends and Family: More than three times a week   Frequency of Social Gatherings with Friends and Family: More than three times a week   Attends Religious Services: More than 4 times per year   Active Member of [employer] or Organizations: No   Attends Archivist Meetings: Never   Marital Status: Married  Human resources officer Violence: Not At Risk   Fear of Current or Ex-Partner: No   Emotionally Abused: No   Physically Abused: No   Sexually Abused: No    Current Outpatient Medications on File Prior to Visit  Medication Sig Dispense Refill   ACCU-CHEK AVIVA PLUS test strip Use to check BS 2x a day 100 each 12   alfuzosin (UROXATRAL) 10 MG 24 hr tablet Take 10 mg by mouth daily with breakfast.     amLODipine (NORVASC) 5 MG tablet Take 1 tablet (5 mg total) by mouth daily. 90 tablet 1   aspirin EC 81 MG tablet Take 81 mg by mouth at bedtime.      atorvastatin (LIPITOR) 10 MG tablet Take 1 tablet (10 mg total) by mouth once  a week. (Patient taking differently: Take 10 mg by mouth every Wednesday.) 90 tablet 1   Cholecalciferol (VITAMIN D3) 50 MCG (2000 UT) TABS Take 2,000 Units by mouth daily.     fluticasone furoate-vilanterol (BREO ELLIPTA) 100-25 MCG/INH AEPB Inhale 1 puff into the lungs daily. 180 each 2   Insulin Pen Needle (PEN NEEDLES) 32G X 4 MM MISC To use daily with Lantus solostar. Dx E11.9 100 each 1   metFORMIN (GLUCOPHAGE-XR) 500 MG 24 hr tablet Take 4 tablets (2,000 mg total) by mouth daily. 360 tablet 3   metoprolol tartrate (LOPRESSOR) 50 MG tablet Take 1 tablet (  50 mg total) by mouth 2 (two) times daily. 180 tablet 3   montelukast (SINGULAIR) 10 MG tablet Take 1 tablet (10 mg total) by mouth at bedtime. 30 tablet 11   MULTIPLE VITAMIN PO Take 1 tablet by mouth daily.     Multiple Vitamins-Minerals (HAIR SKIN AND NAILS FORMULA) TABS Take 1 tablet by mouth daily. Gummie     nitroGLYCERIN (NITROSTAT) 0.4 MG SL tablet Place 1 tablet (0.4 mg total) under the tongue every 5 (five) minutes as needed for chest pain. 5 tablet 0   pantoprazole (PROTONIX) 40 MG tablet TAKE 1 TABLET(40 MG) BY MOUTH DAILY 90 tablet 1   REPATHA SURECLICK [id number] MG/ML SOAJ ADMINISTER 1 ML UNDER THE SKIN EVERY 14 DAYS 2 mL 11   Semaglutide (RYBELSUS) 14 MG TABS Take 14 mg by mouth daily. 90 tablet 3   Suvorexant 10 MG TABS Take 10 mg by mouth at bedtime. 30 tablet 0   tadalafil (CIALIS) 20 MG tablet Take 0.5-1 tablets (10-20 mg total) by mouth every other day as needed for erectile dysfunction. 5 tablet 1   vitamin C (ASCORBIC ACID) 250 MG tablet Take 500 mg by mouth daily. Gummie     No current facility-administered medications on file prior to visit.    Allergies  Allergen Reactions   Plavix [Clopidogrel Bisulfate] Shortness Of Breath   Oxycontin [Oxycodone Hcl] Other (See Comments)    Malaise   Penicillins Other (See Comments)    Cotton Mouth    Pioglitazone Other (See Comments)    Made his chest hurt   Prednisone Other  (See Comments)    Reaction: muscle aches and soreness   Simvastatin Other (See Comments)    Myalgias    Family History  Problem Relation Age of Onset   Ovarian cancer Mother    Diabetes Brother    Colon cancer Sister 75       deceased from colon cancer   Diabetes Maternal Grandmother     BP (!) 152/70 (BP Location: Right Arm, Patient Position: Sitting, Cuff Size: Normal)   Pulse 80   Ht '5\' 7"'$  (1.702 m)   Wt 176 lb 6.4 oz (80 kg)   SpO2 97%   BMI 27.63 kg/m    Review of Systems He denies hypoglycemia    Objective:   Physical Exam Pulses: dorsalis pedis absent bilat.   MSK: no deformity of the feet CV: 1+ bilat leg edema Skin:  no ulcer on the feet.  normal color and temp on the feet.   Neuro: sensation is intact to touch on the feet.   Lab Results  Component Value Date   CREATININE 0.81 09/30/2020   BUN 19 09/30/2020   NA 136 09/30/2020   K 4.3 09/30/2020   CL 101 09/30/2020   CO2 26 09/30/2020    Lab Results  Component Value Date   HGBA1C 7.3 (A) 10/07/2020      Assessment & Plan:  Insulin-requiring type 2 DM: this is the best control this pt should aim for, given this regimen, which does match insulin to his changing needs throughout the day Diarrhea, due to [place]; pt is advised to stay off it.  Patient Instructions  check your blood sugar twice a day.  vary the time of day when you check, between before the 3 meals, and at bedtime.  also check if you have symptoms of your blood sugar being too high or too low.  please keep a record of the readings and bring it  to your next appointment here (or you can bring the meter itself).  You can write it on any piece of paper.  please call us sooner if your blood sugar goes below 70, or if you have a lot of readings over 200.   Please continue the same lantus and other medications.  Please come back for a follow-up appointment in 3 months.

## 2020-10-07 NOTE — Patient Instructions (Signed)
Medication Instructions:  No Changes In Medications at this time.  *If you need a refill on your cardiac medications before your next appointment, please call your pharmacy*  Testing/Procedures: Your physician has requested that you have a carotid duplex. This test is an ultrasound of the carotid arteries in your neck. It looks at blood flow through these arteries that supply the brain with blood. Allow one hour for this exam. There are no restrictions or special instructions.  Your physician has requested that you have an echocardiogram. Echocardiography is a painless test that uses sound waves to create images of your heart. It provides your doctor with information about the size and shape of your heart and how well your heart's chambers and valves are working. You may receive an ultrasound enhancing agent through an IV if needed to better visualize your heart during the echo.This procedure takes approximately one hour. There are no restrictions for this procedure. This will take place at the [address], [address].   Follow-Up: At [facility], you and your health needs are our priority.  As part of our continuing mission to provide you with exceptional heart care, we have created designated Provider Care Teams.  These Care Teams include your primary Cardiologist (physician) and Advanced Practice Providers (APPs -  Physician Assistants and Nurse Practitioners) who all work together to provide you with the care you need, when you need it.  We recommend signing up for the patient portal called "MyChart".  Sign up information is provided on this After Visit Summary.  MyChart is used to connect with patients for Virtual Visits (Telemedicine).  Patients are able to view lab/test results, encounter notes, upcoming appointments, etc.  Non-urgent messages can be sent to your provider as well.   To learn more about what you can do with MyChart, go to [email or web address].    Your next  appointment:   November 7th at 9:40AM with Dr. Sallyanne Kuster- Pacer Check  February 7th at 10:00 AM with Dr. Fletcher Anon   The format for your next appointment:   In Person

## 2020-10-07 NOTE — Patient Instructions (Addendum)
check your blood sugar twice a day.  vary the time of day when you check, between before the 3 meals, and at bedtime.  also check if you have symptoms of your blood sugar being too high or too low.  please keep a record of the readings and bring it to your next appointment here (or you can bring the meter itself).  You can write it on any piece of paper.  please call us sooner if your blood sugar goes below 70, or if you have a lot of readings over 200.   Please continue the same lantus and other medications.  Please come back for a follow-up appointment in 3 months.

## 2020-10-08 ENCOUNTER — Ambulatory Visit (HOSPITAL_COMMUNITY)
Admission: RE | Admit: 2020-10-08 | Discharge: 2020-10-08 | Disposition: A | Discharge status: Home / Self Care | Payer: Medicare PPO | Source: Ambulatory Visit | Attending: Cardiology | Admitting: Cardiology

## 2020-10-08 DIAGNOSIS — I6521 Occlusion and stenosis of right carotid artery: Secondary | ICD-10-CM

## 2020-10-08 DIAGNOSIS — I6529 Occlusion and stenosis of unspecified carotid artery: Secondary | ICD-10-CM | POA: Diagnosis not present

## 2020-10-10 ENCOUNTER — Other Ambulatory Visit: Payer: Self-pay | Admitting: *Deleted

## 2020-10-10 DIAGNOSIS — I6529 Occlusion and stenosis of unspecified carotid artery: Secondary | ICD-10-CM

## 2020-10-14 ENCOUNTER — Ambulatory Visit (INDEPENDENT_AMBULATORY_CARE_PROVIDER_SITE_OTHER): Payer: Medicare PPO

## 2020-10-14 DIAGNOSIS — I495 Sick sinus syndrome: Secondary | ICD-10-CM | POA: Diagnosis not present

## 2020-10-14 LAB — CUP PACEART REMOTE DEVICE CHECK
Battery Remaining Longevity: 115 mo
Battery Remaining Percentage: 95 %
Battery Voltage: 3.02 V
Brady Statistic AP VP Percent: 1 %
Brady Statistic AP VS Percent: 9.8 %
Brady Statistic AS VP Percent: 1 %
Brady Statistic AS VS Percent: 90 %
Brady Statistic RA Percent Paced: 9.1 %
Brady Statistic RV Percent Paced: 1 %
Date Time Interrogation Session: 20220823121530
Implantable Lead Implant Date: 20210823
Implantable Lead Implant Date: 20210823
Implantable Lead Location: 753859
Implantable Lead Location: 753860
Implantable Pulse Generator Implant Date: 20210823
Lead Channel Impedance Value: 390 Ohm
Lead Channel Impedance Value: 610 Ohm
Lead Channel Pacing Threshold Amplitude: 0.75 V
Lead Channel Pacing Threshold Amplitude: 0.75 V
Lead Channel Pacing Threshold Pulse Width: 0.5 ms
Lead Channel Pacing Threshold Pulse Width: 0.5 ms
Lead Channel Sensing Intrinsic Amplitude: 10.5 mV
Lead Channel Sensing Intrinsic Amplitude: 4.4 mV
Lead Channel Setting Pacing Amplitude: 2 V
Lead Channel Setting Pacing Amplitude: 2 V
Lead Channel Setting Pacing Pulse Width: 0.5 ms
Lead Channel Setting Sensing Sensitivity: 2 mV
Pulse Gen Model: 2272
Pulse Gen Serial Number: 3859349

## 2020-10-15 DIAGNOSIS — E119 Type 2 diabetes mellitus without complications: Secondary | ICD-10-CM | POA: Diagnosis not present

## 2020-10-15 DIAGNOSIS — Z950 Presence of cardiac pacemaker: Secondary | ICD-10-CM | POA: Diagnosis not present

## 2020-10-15 DIAGNOSIS — I10 Essential (primary) hypertension: Secondary | ICD-10-CM | POA: Diagnosis not present

## 2020-10-15 DIAGNOSIS — E782 Mixed hyperlipidemia: Secondary | ICD-10-CM | POA: Diagnosis not present

## 2020-10-23 DIAGNOSIS — M5416 Radiculopathy, lumbar region: Secondary | ICD-10-CM | POA: Diagnosis not present

## 2020-10-28 ENCOUNTER — Other Ambulatory Visit: Payer: Self-pay | Admitting: Endocrinology

## 2020-10-28 DIAGNOSIS — E119 Type 2 diabetes mellitus without complications: Secondary | ICD-10-CM

## 2020-10-29 ENCOUNTER — Ambulatory Visit (HOSPITAL_COMMUNITY): Payer: Medicare PPO | Attending: Cardiovascular Disease

## 2020-10-29 ENCOUNTER — Encounter (INDEPENDENT_AMBULATORY_CARE_PROVIDER_SITE_OTHER): Payer: Self-pay

## 2020-10-29 ENCOUNTER — Other Ambulatory Visit: Payer: Self-pay

## 2020-10-29 DIAGNOSIS — E785 Hyperlipidemia, unspecified: Secondary | ICD-10-CM | POA: Diagnosis not present

## 2020-10-29 DIAGNOSIS — I6529 Occlusion and stenosis of unspecified carotid artery: Secondary | ICD-10-CM | POA: Insufficient documentation

## 2020-10-29 DIAGNOSIS — E119 Type 2 diabetes mellitus without complications: Secondary | ICD-10-CM | POA: Insufficient documentation

## 2020-10-29 DIAGNOSIS — I350 Nonrheumatic aortic (valve) stenosis: Secondary | ICD-10-CM

## 2020-10-29 DIAGNOSIS — I10 Essential (primary) hypertension: Secondary | ICD-10-CM | POA: Insufficient documentation

## 2020-10-29 DIAGNOSIS — F17200 Nicotine dependence, unspecified, uncomplicated: Secondary | ICD-10-CM | POA: Insufficient documentation

## 2020-10-29 LAB — ECHOCARDIOGRAM COMPLETE
AR max vel: 1.13 cm2
AV Area VTI: 1.08 cm2
AV Area mean vel: 1.01 cm2
AV Mean grad: 20.2 mmHg
AV Peak grad: 32.9 mmHg
Ao pk vel: 2.87 m/s
Area-P 1/2: 3.42 cm2
S' Lateral: 3 cm

## 2020-10-29 NOTE — Progress Notes (Signed)
Remote pacemaker transmission.   

## 2020-10-31 ENCOUNTER — Other Ambulatory Visit: Payer: Self-pay | Admitting: *Deleted

## 2020-10-31 DIAGNOSIS — I350 Nonrheumatic aortic (valve) stenosis: Secondary | ICD-10-CM

## 2020-11-03 ENCOUNTER — Other Ambulatory Visit: Payer: Self-pay | Admitting: Cardiovascular Disease

## 2020-11-17 DIAGNOSIS — M5416 Radiculopathy, lumbar region: Secondary | ICD-10-CM | POA: Diagnosis not present

## 2020-11-28 DIAGNOSIS — H04129 Dry eye syndrome of unspecified lacrimal gland: Secondary | ICD-10-CM | POA: Diagnosis not present

## 2020-11-28 DIAGNOSIS — I471 Supraventricular tachycardia: Secondary | ICD-10-CM | POA: Diagnosis not present

## 2020-11-28 DIAGNOSIS — E1151 Type 2 diabetes mellitus with diabetic peripheral angiopathy without gangrene: Secondary | ICD-10-CM | POA: Diagnosis not present

## 2020-11-28 DIAGNOSIS — E1165 Type 2 diabetes mellitus with hyperglycemia: Secondary | ICD-10-CM | POA: Diagnosis not present

## 2020-11-28 DIAGNOSIS — I25119 Atherosclerotic heart disease of native coronary artery with unspecified angina pectoris: Secondary | ICD-10-CM | POA: Diagnosis not present

## 2020-11-28 DIAGNOSIS — Z794 Long term (current) use of insulin: Secondary | ICD-10-CM | POA: Diagnosis not present

## 2020-11-28 DIAGNOSIS — I495 Sick sinus syndrome: Secondary | ICD-10-CM | POA: Diagnosis not present

## 2020-11-28 DIAGNOSIS — E785 Hyperlipidemia, unspecified: Secondary | ICD-10-CM | POA: Diagnosis not present

## 2020-11-28 DIAGNOSIS — I10 Essential (primary) hypertension: Secondary | ICD-10-CM | POA: Diagnosis not present

## 2020-12-04 DIAGNOSIS — L570 Actinic keratosis: Secondary | ICD-10-CM | POA: Diagnosis not present

## 2020-12-04 DIAGNOSIS — X32XXXD Exposure to sunlight, subsequent encounter: Secondary | ICD-10-CM | POA: Diagnosis not present

## 2020-12-18 ENCOUNTER — Other Ambulatory Visit: Payer: Self-pay | Admitting: Endocrinology

## 2020-12-18 DIAGNOSIS — E119 Type 2 diabetes mellitus without complications: Secondary | ICD-10-CM

## 2020-12-29 ENCOUNTER — Encounter: Payer: Self-pay | Admitting: Cardiovascular Disease

## 2020-12-29 ENCOUNTER — Ambulatory Visit (INDEPENDENT_AMBULATORY_CARE_PROVIDER_SITE_OTHER): Payer: Medicare PPO | Admitting: Cardiovascular Disease

## 2020-12-29 ENCOUNTER — Other Ambulatory Visit: Payer: Self-pay

## 2020-12-29 VITALS — BP 140/58 | HR 78 | Ht 67.0 in | Wt 177.2 lb

## 2020-12-29 DIAGNOSIS — I6523 Occlusion and stenosis of bilateral carotid arteries: Secondary | ICD-10-CM | POA: Diagnosis not present

## 2020-12-29 DIAGNOSIS — Z950 Presence of cardiac pacemaker: Secondary | ICD-10-CM | POA: Diagnosis not present

## 2020-12-29 DIAGNOSIS — E1151 Type 2 diabetes mellitus with diabetic peripheral angiopathy without gangrene: Secondary | ICD-10-CM | POA: Diagnosis not present

## 2020-12-29 DIAGNOSIS — I350 Nonrheumatic aortic (valve) stenosis: Secondary | ICD-10-CM

## 2020-12-29 DIAGNOSIS — E785 Hyperlipidemia, unspecified: Secondary | ICD-10-CM

## 2020-12-29 DIAGNOSIS — I10 Essential (primary) hypertension: Secondary | ICD-10-CM

## 2020-12-29 DIAGNOSIS — I495 Sick sinus syndrome: Secondary | ICD-10-CM

## 2020-12-29 DIAGNOSIS — I739 Peripheral vascular disease, unspecified: Secondary | ICD-10-CM

## 2020-12-29 DIAGNOSIS — I25118 Atherosclerotic heart disease of native coronary artery with other forms of angina pectoris: Secondary | ICD-10-CM

## 2020-12-29 DIAGNOSIS — I471 Supraventricular tachycardia: Secondary | ICD-10-CM

## 2020-12-29 NOTE — Patient Instructions (Signed)

## 2020-12-29 NOTE — Progress Notes (Signed)
Cardiology Office Note:    Date:  01/04/2021   ID:  Marcus Norton, DOB 1941-02-11, MRN [id number]  PCP:  Tomasa Hose, NP  [facility] Cardiologist:  [name], MD  [facility] Electrophysiologist:  None   Referring MD: Tomasa Hose, NP   Chief Complaint  Patient presents with   Pacemaker Check     History of Present Illness:    Marcus Norton is a 80 y.o. male with a hx of PAD (bilateral iliac kissing stent placement October 2017, occlusion of right SFA with popliteal reconstitution, diffuse moderate left SFA stenosis), SVT, CAD (single-vessel disease ostial RCA stent 2018, occluded second posterolateral ventricular branch), mild-moderate aortic stenosis, moderate bilateral carotid artery disease by CT, HTN, DM type 2 on insulin,  hypercholesterolemia (on statin + PCSK9 inhibitor).  In August 2021 he underwent implantation of a dual-chamber permanent pacemaker ([place]) for tachycardia-bradycardia syndrome (symptomatic sinus pauses and recurrent episodes of paroxysmal ectopic atrial tachycardia).  Since pacemaker implantation he has not had any complaints of palpitations, syncope or near syncope.  He denies dizzy spells or fatigue.  He does not have dyspnea or angina at rest or with activity.  He denies lower extremity edema.  Has not been troubled by claudication.  Pacemaker function is normal.  Estimated device longevity is 8.4-10.4 years.  He has 8.3% atrial pacing and less than 1% ventricular pacing.  There have been only 4 episodes of atrial mode switch including a very brief 6-second episode that shows highly irregular atrial activity consistent with atrial fibrillation.  The echocardiogram performed August 16, 2019 showed normal LVEF 65-70%, mild LVH, diastolic dysfunction with elevated filling pressures, degenerative aortic valve changes with mild-moderate stenosis (mean gradient 18 mmHg, dimensionless index 0.32, calculated aortic valve area  1.12 cm).  The follow-up echocardiogram performed September 2022 shows moderate aortic stenosis with a mean gradient of 20 mmHg and a calculated valve area of 1.08 cm sq.   Losartan was stopped last year for hyperkalemia.   Past Medical History:  Diagnosis Date   Adenomatous colon polyp 2000/2010   Arthritis    "lower back" (07/29/2016)   ASCVD (arteriosclerotic cardiovascular disease)    -luminal irregularities in 1998 and 2005; normal EF   Benign prostatic hypertrophy    status post transurethral resection of the prostate   CAD (coronary artery disease)    a. Cath 07/29/16 99% ostial RCA & 70% pRCA s/p cutting balloon angioplasty and single SYNERGY DES 2.5X28. 100% very Distal 1st RPLB lesion --> the occlusion site moved further distal with guidewire advancement;  40% ostial LAD; 50% Ost Cx to Prox Cx lesion.   Coronary artery disease    Dysphagia    Erectile dysfunction    GERD (gastroesophageal reflux disease)    Heart murmur    History of hiatal hernia    History of kidney stones    Hyperlipidemia    Hypertension    Seasonal allergies    Tobacco abuse    20 pack years; discontinued 1995   Type II diabetes mellitus ([place])     Past Surgical History:  Procedure Laterality Date   APPENDECTOMY     CATARACT EXTRACTION W/ INTRAOCULAR LENS  IMPLANT, BILATERAL Bilateral    COLONOSCOPY  07/20/2011   Dr. Gala Romney: multiple hyperplastic polyps. Surveillance 2018   COLONOSCOPY N/A 06/30/2016   Procedure: COLONOSCOPY;  Surgeon: Daneil Dolin, MD;  [facility];  Service: Endoscopy;  Laterality: N/A;  10:00am   COLONOSCOPY  W/ BIOPSIES AND POLYPECTOMY  07/08/08, 06/2011   Dr Madolyn Frieze papilla, diminutive rectal polyp ablated the, left-sided diverticula, piecemeal polypectomy of hepatic flexure adenomatous polyp, diminutive polyps near hepatic flexure ablated   CORONARY ANGIOPLASTY WITH STENT PLACEMENT  07/29/2016   CORONARY STENT INTERVENTION N/A 07/29/2016   Procedure: Coronary  Stent Intervention;  Surgeon: Leonie Man, MD;  Location: [place] [facility];  Service: Cardiovascular;  Laterality: N/A;  RCA   ESOPHAGOGASTRODUODENOSCOPY  2013   erosive reflux esophagitis, s/p empiric dilation. chronic duodenitis.    LEFT HEART CATH AND CORONARY ANGIOGRAPHY N/A 07/29/2016   Procedure: Left Heart Cath and Coronary Angiography;  Surgeon: Leonie Man, MD;  Location: [place];  Service: Cardiovascular;  Laterality: N/A;   NASAL SEPTUM SURGERY     NASAL SINUS SURGERY     "cleaned out my sinus"   PACEMAKER IMPLANT N/A 10/15/2019   Procedure: PACEMAKER IMPLANT;  Surgeon: Sanda Klein, MD;  Location: [place] [facility];  Service: Cardiovascular;  Laterality: N/A;   PERIPHERAL VASCULAR CATHETERIZATION N/A 11/26/2015   Procedure: Abdominal Aortogram w/Lower Extremity;  Surgeon: Wellington Hampshire, MD;  Location: [facility];  Service: Cardiovascular;  Laterality: N/A;   TONSILLECTOMY     TRANSURETHRAL RESECTION OF PROSTATE  2009    Current Medications: Current Meds  Medication Sig   ACCU-CHEK AVIVA PLUS test strip USE AS DIRECTED   alfuzosin (UROXATRAL) 10 MG 24 hr tablet Take 10 mg by mouth daily with breakfast.   amLODipine (NORVASC) 5 MG tablet Take 1 tablet (5 mg total) by mouth daily.   aspirin EC 81 MG tablet Take 81 mg by mouth at bedtime.    atorvastatin (LIPITOR) 10 MG tablet Take 1 tablet (10 mg total) by mouth once a week. (Patient taking differently: Take 10 mg by mouth every Wednesday.)   Cholecalciferol (VITAMIN D3) 50 MCG (2000 UT) TABS Take 2,000 Units by mouth daily.   fluorometholone (FML) 0.1 % ophthalmic suspension 1 drop 2 (two) times daily.   insulin glargine (LANTUS SOLOSTAR) 100 UNIT/ML Solostar Pen Inject 28 Units into the skin every morning.   Insulin Pen Needle (PEN NEEDLES) 32G X 4 MM MISC To use daily with Lantus solostar. Dx E11.9   memantine (NAMENDA) 5 MG tablet Take 5 mg by mouth daily.   metFORMIN (GLUCOPHAGE-XR) 500 MG  24 hr tablet Take 4 tablets (2,000 mg total) by mouth daily.   metoprolol tartrate (LOPRESSOR) 50 MG tablet TAKE 1 TABLET BY MOUTH TWICE A DAY   Multiple Vitamins-Minerals (HAIR SKIN AND NAILS FORMULA) TABS Take 1 tablet by mouth daily. Gummie   pantoprazole (PROTONIX) 40 MG tablet TAKE 1 TABLET(40 MG) BY MOUTH DAILY   REPATHA SURECLICK 030 MG/ML SOAJ ADMINISTER 1 ML UNDER THE SKIN EVERY 14 DAYS   Semaglutide (RYBELSUS) 14 MG TABS Take 14 mg by mouth daily.   tadalafil (CIALIS) 20 MG tablet Take 0.5-1 tablets (10-20 mg total) by mouth every other day as needed for erectile dysfunction.   vitamin C (ASCORBIC ACID) 250 MG tablet Take 500 mg by mouth daily. Gummie     Allergies:   Plavix [clopidogrel bisulfate], Oxycontin [oxycodone hcl], Penicillins, Pioglitazone, Prednisone, and Simvastatin   Social History   Socioeconomic History   Marital status: Married    Spouse name: Not on file   Number of children: 2   Years of education: Not on file   Highest education level: Not on file  Occupational History   Occupation: Dietitian,  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    Comment: [employer]  Tobacco Use   Smoking status: Former    Packs/day: 1.00    Years: 15.00    Pack years: 15.00    Types: Cigarettes    Start date: 03/08/1965    Quit date: 07/07/1993    Years since quitting: 27.5   Smokeless tobacco: Never  Vaping Use   Vaping Use: Never used  Substance and Sexual Activity   Alcohol use: No    Alcohol/week: 0.0 standard drinks    Comment: 07/29/2016 "last drink was 4 wks ago; had a beer"   Drug use: No   Sexual activity: Not Currently  Other Topics Concern   Not on file  Social History Narrative   5 grandchildren    Social Determinants of Health   Financial Resource Strain: Low Risk    Difficulty of Paying Living Expenses: Not hard at all  Food Insecurity: No Food Insecurity   Worried About Charity fundraiser in the Last Year: Never true   Ran Out of Food in the  Last Year: Never true  Transportation Needs: No Transportation Needs   Lack of Transportation (Medical): No   Lack of Transportation (Non-Medical): No  Physical Activity: Sufficiently Active   Days of Exercise per Week: 4 days   Minutes of Exercise per Session: 40 min  Stress: No Stress Concern Present   Feeling of Stress : Not at all  Social Connections: Moderately Integrated   Frequency of Communication with Friends and Family: More than three times a week   Frequency of Social Gatherings with Friends and Family: More than three times a week   Attends Religious Services: More than 4 times per year   Active Member of [employer] or Organizations: No   Attends Music therapist: Never   Marital Status: Married     Family History: The patient's family history includes Colon cancer (age of onset: 38) in his sister; Diabetes in his brother and maternal grandmother; Ovarian cancer in his mother.  ROS:   Please see the history of present illness.   All other systems are reviewed and are negative.   EKGs/Labs/Other Studies Reviewed:    The following studies were reviewed today: Echo 10/29/2020  1. Left ventricular ejection fraction, by estimation, is 60 to 65%. The  left ventricle has normal function. The left ventricle has no regional  wall motion abnormalities. Left ventricular diastolic parameters are  consistent with Grade I diastolic  dysfunction (impaired relaxation). Elevated left atrial pressure.   2. Right ventricular systolic function is normal. The right ventricular  size is normal.   3. The mitral valve is normal in structure. Trivial mitral valve  regurgitation. No evidence of mitral stenosis.   4. The aortic valve is tricuspid. There is moderate calcification of the  aortic valve. There is moderate thickening of the aortic valve. Aortic  valve regurgitation is not visualized. Moderate aortic valve stenosis.  Aortic valve mean gradient measures  20.2 mmHg. Aortic  valve Vmax measures 2.87 m/s.   5. The inferior vena cava is normal in size with greater than 50%  respiratory variability, suggesting right atrial pressure of 3 mmHg.   Comparison(s): No significant change from prior study. Prior images  reviewed side by side. There is very slight worsening of the aortic valve  gradients.   Event monitor 2 weeks 09/20/2019 Normal sinus rhythm with an average heart rate of 75 bpm. 14 runs of SVT, the longest lasted  9.4 seconds with a rate of 134 bpm. 3-second pause which happened at 11 in the morning.  This was a triggered event but symptoms were not specified. Frequent PACs with a burden of 10.1%.  Some of the triggered events correlated with PACs. Rare PVCs with a burden of less than 1%.   Comprehensive pacemaker check including testing of the lead pacing thresholds in the office today   EKG:  EKG is not ordered today.  The intracardiac electrogram shows atrial sensed (sinus), ventricular sensed rhythm.  Recent Labs: 09/30/2020: BUN 19; Creatinine, Ser 0.81; Hemoglobin 14.3; Platelets 271; Potassium 4.3; Sodium 136  Recent Lipid Panel    Component Value Date/Time   CHOL 57 12/20/2019 0903   CHOL 75 (L) 02/06/2019 1052   TRIG 50.0 12/20/2019 0903   TRIG 96 03/12/2008 0000   HDL 34.50 (L) 12/20/2019 0903   HDL 37 (L) 02/06/2019 1052   CHOLHDL 2 12/20/2019 0903   VLDL 10.0 12/20/2019 0903   LDLCALC 12 12/20/2019 0903   LDLCALC 24 02/06/2019 1052   LDLCALC 91 03/12/2008 0000     Physical Exam:    VS:  BP (!) 140/58 (BP Location: Left Arm, Patient Position: Sitting, Cuff Size: Normal)   Pulse 78   Ht 5\' 7"  (1.702 m)   Wt 177 lb 3.2 oz (80.4 kg)   SpO2 94%   BMI 27.75 kg/m     Wt Readings from Last 3 Encounters:  12/29/20 177 lb 3.2 oz (80.4 kg)  10/07/20 177 lb 6.4 oz (80.5 kg)  10/07/20 176 lb 6.4 oz (80 kg)      General: Alert, oriented x3, no distress, healthy left subclavian pacemaker site Head: no evidence of trauma, PERRL,  EOMI, no exophtalmos or lid lag, no myxedema, no xanthelasma; normal ears, nose and oropharynx Neck: normal jugular venous pulsations and no hepatojugular reflux; brisk carotid pulses without delay and no carotid bruits Chest: clear to auscultation, no signs of consolidation by percussion or palpation, normal fremitus, symmetrical and full respiratory excursions Cardiovascular: normal position and quality of the apical impulse, regular rhythm, normal first and second heart sounds, 3/6 early peaking aortic ejection murmur, no diastolic murmurs, rubs or gallops Abdomen: no tenderness or distention, no masses by palpation, no abnormal pulsatility or arterial bruits, normal bowel sounds, no hepatosplenomegaly Extremities: no clubbing, cyanosis or edema; 2+ radial, ulnar and brachial pulses bilaterally; 2+ right femoral, posterior tibial and dorsalis pedis pulses; 2+ left femoral, posterior tibial and dorsalis pedis pulses; no subclavian or femoral bruits Neurological: grossly nonfocal Psych: Normal mood and affect   ASSESSMENT:    1. Tachycardia-bradycardia syndrome ([place])   2. Pacemaker   3. PAT (paroxysmal atrial tachycardia) ([place])   4. Aortic valve stenosis, nonrheumatic   5. PAD (peripheral artery disease) ([place])   6. Coronary artery disease of native artery of native heart with stable angina pectoris ([place])   7. Bilateral carotid artery stenosis   8. Dyslipidemia (high LDL; low HDL)   9. DM (diabetes mellitus), type 2 with peripheral vascular complications ([place])   10. Essential hypertension     PLAN:    In order of problems listed above:  Tachycardia-bradycardia syndrome: He had an episode that looks like atrial fibrillation with a lasted for 6 seconds.  Anticoagulation is not indicated. PM: Continue downloads every 3 months.  He is not pacemaker dependent. PAT: Infrequent after increasing the dose of beta-blocker. AS: Moderate aortic stenosis mean gradient 20 mmHg.  Asymptomatic. PAD:  Currently without claudication. CAD:  Denies problems with angina pectoris with daily activity.  Known occluded posterior lateral ventricular branch, currently exertional angina is controlled with beta-blockers and calcium channel blocker. Bilateral moderate carotid artery stenosis: Follow-up ultrasound in August 2022 showed mild bilateral disease.  No symptoms to suggest TIA/CVA. HLP: Excellent reduction in LDL level with Repatha. DM: Improved control, most recent hemoglobin A1c 7.3% in August 2022. HTN: Blood pressure control is adequate.  He has a rather low diastolic blood pressure.  Target systolic blood pressure 824.  Medication Adjustments/Labs and Tests Ordered: Current medicines are reviewed at length with the patient today.  Concerns regarding medicines are outlined above.  No orders of the defined types were placed in this encounter.  No orders of the defined types were placed in this encounter.   Patient Instructions  Medication Instructions:  No changes *If you need a refill on your cardiac medications before your next appointment, please call your pharmacy*   Lab Work: None ordered If you have labs (blood work) drawn today and your tests are completely normal, you will receive your results only by: [place] (if you have MyChart) OR A paper copy in the mail If you have any lab test that is abnormal or we need to change your treatment, we will call you to review the results.   Testing/Procedures: None ordered   Follow-Up: At [facility], you and your health needs are our priority.  As part of our continuing mission to provide you with exceptional heart care, we have created designated Provider Care Teams.  These Care Teams include your primary Cardiologist (physician) and Advanced Practice Providers (APPs -  Physician Assistants and Nurse Practitioners) who all work together to provide you with the care you need, when you need it.  We recommend signing up for  the patient portal called "MyChart".  Sign up information is provided on this After Visit Summary.  MyChart is used to connect with patients for Virtual Visits (Telemedicine).  Patients are able to view lab/test results, encounter notes, upcoming appointments, etc.  Non-urgent messages can be sent to your provider as well.   To learn more about what you can do with MyChart, go to [email or web address].    Your next appointment:   12 month(s)  The format for your next appointment:   In Person  Provider:   Sanda Klein, MD       Signed, Sanda Klein, MD  01/04/2021 3:47 PM    [place]

## 2021-01-04 ENCOUNTER — Encounter: Payer: Self-pay | Admitting: Cardiovascular Disease

## 2021-01-04 DIAGNOSIS — E785 Hyperlipidemia, unspecified: Secondary | ICD-10-CM | POA: Insufficient documentation

## 2021-01-07 ENCOUNTER — Ambulatory Visit: Payer: Medicare PPO | Admitting: Endocrinology

## 2021-01-07 ENCOUNTER — Other Ambulatory Visit: Payer: Self-pay

## 2021-01-07 VITALS — BP 144/68 | HR 73 | Ht 67.0 in | Wt 178.0 lb

## 2021-01-07 DIAGNOSIS — E119 Type 2 diabetes mellitus without complications: Secondary | ICD-10-CM

## 2021-01-07 LAB — POCT GLYCOSYLATED HEMOGLOBIN (HGB A1C): Hemoglobin A1C: 7.4 % — AB (ref 4.0–5.6)

## 2021-01-07 MED ORDER — LANTUS SOLOSTAR 100 UNIT/ML ~~LOC~~ SOPN
28.0000 [IU] | PEN_INJECTOR | SUBCUTANEOUS | 3 refills | Status: AC
Start: 2021-01-07 — End: ?

## 2021-01-07 MED ORDER — FREESTYLE LIBRE 2 SENSOR MISC: 1.0000 | 3 refills | Status: AC

## 2021-01-07 MED ORDER — FREESTYLE LIBRE 2 READER DEVI: 1.0000 | Freq: Once | 1 refills | Status: AC

## 2021-01-07 NOTE — Patient Instructions (Addendum)
check your blood sugar twice a day.  vary the time of day when you check, between before the 3 meals, and at bedtime.  also check if you have symptoms of your blood sugar being too high or too low.  please keep a record of the readings and bring it to your next appointment here (or you can bring the meter itself).  You can write it on any piece of paper.  please call us sooner if your blood sugar goes below 70, or if you have a lot of readings over 200.    Please continue the same lantus and other medications.  I have sent a prescription to your pharmacy, for the continuous glucose monitor.   Please come back for a follow-up appointment in 3 months.

## 2021-01-07 NOTE — Progress Notes (Signed)
Subjective:    Patient ID: Marcus Norton, male    DOB: October 19, 1940, 80 y.o.   MRN: [id number]  HPI Pt returns for f/u of diabetes mellitus: DM type: Insulin-requiring type 2 Dx'ed: 3810 Complications: DR, CAD, and PAD Therapy: insulin since 2009, and 2 oral meds.   DKA: never Severe hypoglycemia: never Pancreatitis: never Pancreatic imaging: normal on 2017 CT SDOH: none.   Other: he takes QD insulin, at least for now; He stopped farxiga, due to diarrhea.   Interval history: no cbg record, but states cbg's are in the 100's.  There is no trend throughout the day.  He takes Lantus, 28/d.  Past Medical History:  Diagnosis Date   Adenomatous colon polyp 2000/2010   Arthritis    "lower back" (07/29/2016)   ASCVD (arteriosclerotic cardiovascular disease)    -luminal irregularities in 1998 and 2005; normal EF   Benign prostatic hypertrophy    status post transurethral resection of the prostate   CAD (coronary artery disease)    a. Cath 07/29/16 99% ostial RCA & 70% pRCA s/p cutting balloon angioplasty and single SYNERGY DES 2.5X28. 100% very Distal 1st RPLB lesion --> the occlusion site moved further distal with guidewire advancement;  40% ostial LAD; 50% Ost Cx to Prox Cx lesion.   Coronary artery disease    Dysphagia    Erectile dysfunction    GERD (gastroesophageal reflux disease)    Heart murmur    History of hiatal hernia    History of kidney stones    Hyperlipidemia    Hypertension    Seasonal allergies    Tobacco abuse    20 pack years; discontinued 1995   Type II diabetes mellitus ([place])     Past Surgical History:  Procedure Laterality Date   APPENDECTOMY     CATARACT EXTRACTION W/ INTRAOCULAR LENS  IMPLANT, BILATERAL Bilateral    COLONOSCOPY  07/20/2011   Dr. Gala Romney: multiple hyperplastic polyps. Surveillance 2018   COLONOSCOPY N/A 06/30/2016   Procedure: COLONOSCOPY;  Surgeon: Daneil Dolin, MD;  Location: [facility];  Service: Endoscopy;  Laterality: N/A;  10:00am    COLONOSCOPY W/ BIOPSIES AND POLYPECTOMY  07/08/08, 06/2011   Dr Madolyn Frieze papilla, diminutive rectal polyp ablated the, left-sided diverticula, piecemeal polypectomy of hepatic flexure adenomatous polyp, diminutive polyps near hepatic flexure ablated   CORONARY ANGIOPLASTY WITH STENT PLACEMENT  07/29/2016   CORONARY STENT INTERVENTION N/A 07/29/2016   Procedure: Coronary Stent Intervention;  Surgeon: Leonie Man, MD;  Location: [facility];  Service: Cardiovascular;  Laterality: N/A;  RCA   ESOPHAGOGASTRODUODENOSCOPY  2013   erosive reflux esophagitis, s/p empiric dilation. chronic duodenitis.    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/2019   Procedure: PACEMAKER IMPLANT;  Surgeon: Sanda Klein, MD;  Location: [facility];  Service: Cardiovascular;  Laterality: N/A;   PERIPHERAL VASCULAR CATHETERIZATION N/A 11/26/2015   Procedure: Abdominal Aortogram w/Lower Extremity;  Surgeon: Wellington Hampshire, MD;  Location: [facility];  Service: Cardiovascular;  Laterality: N/A;   TONSILLECTOMY     TRANSURETHRAL RESECTION OF PROSTATE  2009    Social History   Socioeconomic History   Marital status:  Married    Spouse name: Not on file   Number of children: 2   Years of education: Not on file   Highest education level: Not on file  Occupational History   Occupation: Dietitian, [place]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    Comment: [employer]  Tobacco Use   Smoking status: Former    Packs/day: 1.00    Years: 15.00    Pack years: 15.00    Types: Cigarettes    Start date: 03/08/1965    Quit date: 07/07/1993    Years since quitting: 27.5   Smokeless tobacco: Never  Vaping Use   Vaping Use:  Never used  Substance and Sexual Activity   Alcohol use: No    Alcohol/week: 0.0 standard drinks    Comment: 07/29/2016 "last drink was 4 wks ago; had a beer"   Drug use: No   Sexual activity: Not Currently  Other Topics Concern   Not on file  Social History Narrative   5 grandchildren    Social Determinants of Health   Financial Resource Strain: Low Risk    Difficulty of Paying Living Expenses: Not hard at all  Food Insecurity: No Food Insecurity   Worried About Charity fundraiser in the Last Year: Never true   Liberty in the Last Year: Never true  Transportation Needs: No Transportation Needs   Lack of Transportation (Medical): No   Lack of Transportation (Non-Medical): No  Physical Activity: Sufficiently Active   Days of Exercise per Week: 4 days   Minutes of Exercise per Session: 40 min  Stress: No Stress Concern Present   Feeling of Stress : Not at all  Social Connections: Moderately Integrated   Frequency of Communication with Friends and Family: More than three times a week   Frequency of Social Gatherings with Friends and Family: More than three times a week   Attends Religious Services: More than 4 times per year   Active Member of [employer] or Organizations: No   Attends Archivist Meetings: Never   Marital Status: Married  Human resources officer Violence: Not At Risk   Fear of Current or Ex-Partner: No   Emotionally Abused: No   Physically Abused: No   Sexually Abused: No    Current Outpatient Medications on File Prior to Visit  Medication Sig Dispense Refill   ACCU-CHEK AVIVA PLUS test strip USE AS DIRECTED 50 strip 0   alfuzosin (UROXATRAL) 10 MG 24 hr tablet Take 10 mg by mouth daily with breakfast.     amLODipine (NORVASC) 5 MG tablet Take 1 tablet (5 mg total) by mouth daily. 90 tablet 1   aspirin EC 81 MG tablet Take 81 mg by mouth at bedtime.      atorvastatin (LIPITOR) 10 MG tablet Take 1 tablet (10 mg total) by mouth once a week. (Patient  taking differently: Take 10 mg by mouth every Wednesday.) 90 tablet 1   Cholecalciferol (VITAMIN D3) 50 MCG (2000 UT) TABS Take 2,000 Units by mouth daily.     fluorometholone (FML) 0.1 % ophthalmic suspension 1 drop 2 (two) times daily.     fluticasone furoate-vilanterol (BREO ELLIPTA) 100-25 MCG/INH AEPB Inhale 1 puff into the lungs daily. 180 each 2   Insulin Pen Needle (PEN NEEDLES) 32G X 4 MM MISC To use daily with Lantus solostar. Dx E11.9 100 each 1   memantine (NAMENDA) 5 MG tablet Take 5 mg by mouth daily.     metFORMIN (GLUCOPHAGE-XR) 500 MG 24  hr tablet Take 4 tablets (2,000 mg total) by mouth daily. 360 tablet 3   metoprolol tartrate (LOPRESSOR) 50 MG tablet TAKE 1 TABLET BY MOUTH TWICE A DAY 180 tablet 3   montelukast (SINGULAIR) 10 MG tablet Take 1 tablet (10 mg total) by mouth at bedtime. 30 tablet 11   MULTIPLE VITAMIN PO Take 1 tablet by mouth daily.     Multiple Vitamins-Minerals (HAIR SKIN AND NAILS FORMULA) TABS Take 1 tablet by mouth daily. Gummie     nitroGLYCERIN (NITROSTAT) 0.4 MG SL tablet Place 1 tablet (0.4 mg total) under the tongue every 5 (five) minutes as needed for chest pain. 5 tablet 0   pantoprazole (PROTONIX) 40 MG tablet TAKE 1 TABLET(40 MG) BY MOUTH DAILY 90 tablet 1   REPATHA SURECLICK 338 MG/ML SOAJ ADMINISTER 1 ML UNDER THE SKIN EVERY 14 DAYS 2 mL 11   Semaglutide (RYBELSUS) 14 MG TABS Take 14 mg by mouth daily. 90 tablet 3   Suvorexant 10 MG TABS Take 10 mg by mouth at bedtime. 30 tablet 0   tadalafil (CIALIS) 20 MG tablet Take 0.5-1 tablets (10-20 mg total) by mouth every other day as needed for erectile dysfunction. 5 tablet 1   vitamin C (ASCORBIC ACID) 250 MG tablet Take 500 mg by mouth daily. Gummie     No current facility-administered medications on file prior to visit.    Allergies  Allergen Reactions   Plavix [Clopidogrel Bisulfate] Shortness Of Breath   Oxycontin [Oxycodone Hcl] Other (See Comments)    Malaise   Penicillins Other (See  Comments)    Cotton Mouth    Pioglitazone Other (See Comments)    Made his chest hurt   Prednisone Other (See Comments)    Reaction: muscle aches and soreness   Simvastatin Other (See Comments)    Myalgias    Family History  Problem Relation Age of Onset   Ovarian cancer Mother    Diabetes Brother    Colon cancer Sister 4       deceased from colon cancer   Diabetes Maternal Grandmother     BP (!) 144/68 (BP Location: Right Arm, Patient Position: Sitting, Cuff Size: Normal)   Pulse 73   Ht 5\' 7"  (1.702 m)   Wt 178 lb (80.7 kg)   SpO2 97%   BMI 27.88 kg/m    Review of Systems He denies hypoglycemia.      Objective:   Physical Exam    Lab Results  Component Value Date   HGBA1C 7.4 (A) 01/07/2021      Assessment & Plan:  Insulin-requiring type 2 DM:  Uncontrolled.  I rx'ed continuous glucose monitor, to assess glucose trend throughout the day.    Patient Instructions  check your blood sugar twice a day.  vary the time of day when you check, between before the 3 meals, and at bedtime.  also check if you have symptoms of your blood sugar being too high or too low.  please keep a record of the readings and bring it to your next appointment here (or you can bring the meter itself).  You can write it on any piece of paper.  please call us sooner if your blood sugar goes below 70, or if you have a lot of readings over 200.    Please continue the same lantus and other medications.  I have sent a prescription to your pharmacy, for the continuous glucose monitor.   Please come back for a follow-up appointment in  3 months.

## 2021-01-13 ENCOUNTER — Ambulatory Visit (INDEPENDENT_AMBULATORY_CARE_PROVIDER_SITE_OTHER): Payer: Medicare PPO

## 2021-01-13 DIAGNOSIS — I495 Sick sinus syndrome: Secondary | ICD-10-CM

## 2021-01-14 DIAGNOSIS — E782 Mixed hyperlipidemia: Secondary | ICD-10-CM | POA: Diagnosis not present

## 2021-01-14 DIAGNOSIS — E119 Type 2 diabetes mellitus without complications: Secondary | ICD-10-CM | POA: Diagnosis not present

## 2021-01-14 DIAGNOSIS — I10 Essential (primary) hypertension: Secondary | ICD-10-CM | POA: Diagnosis not present

## 2021-01-14 DIAGNOSIS — Z950 Presence of cardiac pacemaker: Secondary | ICD-10-CM | POA: Diagnosis not present

## 2021-01-14 LAB — CUP PACEART REMOTE DEVICE CHECK
Battery Remaining Longevity: 114 mo
Battery Remaining Percentage: 93 %
Battery Voltage: 3.02 V
Brady Statistic AP VP Percent: 1 %
Brady Statistic AP VS Percent: 3.3 %
Brady Statistic AS VP Percent: 1 %
Brady Statistic AS VS Percent: 97 %
Brady Statistic RA Percent Paced: 3.2 %
Brady Statistic RV Percent Paced: 1 %
Date Time Interrogation Session: 20221122155937
Implantable Lead Implant Date: 20210823
Implantable Lead Implant Date: 20210823
Implantable Lead Location: 753859
Implantable Lead Location: 753860
Implantable Pulse Generator Implant Date: 20210823
Lead Channel Impedance Value: 390 Ohm
Lead Channel Impedance Value: 590 Ohm
Lead Channel Pacing Threshold Amplitude: 0.5 V
Lead Channel Pacing Threshold Amplitude: 0.75 V
Lead Channel Pacing Threshold Pulse Width: 0.5 ms
Lead Channel Pacing Threshold Pulse Width: 0.5 ms
Lead Channel Sensing Intrinsic Amplitude: 10.8 mV
Lead Channel Sensing Intrinsic Amplitude: 3.8 mV
Lead Channel Setting Pacing Amplitude: 2 V
Lead Channel Setting Pacing Amplitude: 2 V
Lead Channel Setting Pacing Pulse Width: 0.5 ms
Lead Channel Setting Sensing Sensitivity: 2 mV
Pulse Gen Model: 2272
Pulse Gen Serial Number: 3859349

## 2021-01-22 ENCOUNTER — Encounter (INDEPENDENT_AMBULATORY_CARE_PROVIDER_SITE_OTHER): Payer: Medicare PPO | Admitting: Ophthalmology

## 2021-01-22 NOTE — Progress Notes (Signed)
Remote pacemaker transmission.   

## 2021-01-26 ENCOUNTER — Encounter (INDEPENDENT_AMBULATORY_CARE_PROVIDER_SITE_OTHER): Payer: Medicare PPO | Admitting: Ophthalmology

## 2021-01-30 ENCOUNTER — Other Ambulatory Visit: Payer: Self-pay | Admitting: Endocrinology

## 2021-01-30 DIAGNOSIS — E119 Type 2 diabetes mellitus without complications: Secondary | ICD-10-CM

## 2021-02-03 ENCOUNTER — Ambulatory Visit (INDEPENDENT_AMBULATORY_CARE_PROVIDER_SITE_OTHER): Payer: Medicare PPO | Admitting: Ophthalmology

## 2021-02-03 ENCOUNTER — Other Ambulatory Visit: Payer: Self-pay

## 2021-02-03 DIAGNOSIS — E113312 Type 2 diabetes mellitus with moderate nonproliferative diabetic retinopathy with macular edema, left eye: Secondary | ICD-10-CM

## 2021-02-03 DIAGNOSIS — H43823 Vitreomacular adhesion, bilateral: Secondary | ICD-10-CM

## 2021-02-03 DIAGNOSIS — E113492 Type 2 diabetes mellitus with severe nonproliferative diabetic retinopathy without macular edema, left eye: Secondary | ICD-10-CM | POA: Diagnosis not present

## 2021-02-03 DIAGNOSIS — G4733 Obstructive sleep apnea (adult) (pediatric): Secondary | ICD-10-CM

## 2021-02-03 DIAGNOSIS — E113411 Type 2 diabetes mellitus with severe nonproliferative diabetic retinopathy with macular edema, right eye: Secondary | ICD-10-CM | POA: Diagnosis not present

## 2021-02-03 DIAGNOSIS — H353131 Nonexudative age-related macular degeneration, bilateral, early dry stage: Secondary | ICD-10-CM | POA: Diagnosis not present

## 2021-02-03 DIAGNOSIS — E113311 Type 2 diabetes mellitus with moderate nonproliferative diabetic retinopathy with macular edema, right eye: Secondary | ICD-10-CM

## 2021-02-03 NOTE — Assessment & Plan Note (Signed)
Mild ARMD with retinal drusen although no sign of wet AMD no sign of CNVM OU

## 2021-02-03 NOTE — Assessment & Plan Note (Signed)
No detectable CSME OS

## 2021-02-03 NOTE — Assessment & Plan Note (Signed)
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  If the macular edema change in the right eye continue to worsen we may need to commence with antivegF therapy

## 2021-02-03 NOTE — Progress Notes (Signed)
02/03/2021     CHIEF COMPLAINT Patient presents for  Chief Complaint  Patient presents with   Diabetic Retinopathy without Macular Edema      HISTORY OF PRESENT ILLNESS: Marcus Norton is a 80 y.o. 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  Today at 53-month follow-up Last edited by Hurman Horn, MD on 02/03/2021  1:15 PM.      Referring physician: [name], PA-C [place],  [place] [id number]  HISTORICAL INFORMATION:   Selected notes from the MEDICAL RECORD NUMBER    Lab Results  Component Value Date   HGBA1C 7.4 (A) 01/07/2021     CURRENT MEDICATIONS: Current Outpatient Medications (Ophthalmic Drugs)  Medication Sig   fluorometholone (FML) 0.1 % ophthalmic suspension 1 drop 2 (two) times daily.   No current facility-administered medications for this visit. (Ophthalmic Drugs)   Current Outpatient Medications (Other)  Medication Sig   ACCU-CHEK AVIVA PLUS test strip USE AS DIRECTED   alfuzosin (UROXATRAL) 10 MG 24 hr tablet Take 10 mg by mouth daily with breakfast.   amLODipine (NORVASC) 5 MG tablet Take 1 tablet (5 mg total) by mouth daily.   aspirin EC 81 MG tablet Take 81 mg by mouth at bedtime.    atorvastatin (LIPITOR) 10 MG tablet Take 1 tablet (10 mg total) by mouth once a week. (Patient taking differently: Take 10 mg by mouth every Wednesday.)   Cholecalciferol (VITAMIN D3) 50 MCG (2000 UT) TABS Take 2,000 Units by mouth daily.   Continuous Blood Gluc Sensor (FREESTYLE LIBRE 2 SENSOR) MISC 1 Device by Does not apply route every 14 (fourteen) days.   fluticasone furoate-vilanterol (BREO ELLIPTA) 100-25 MCG/INH AEPB Inhale 1 puff into the lungs daily.   insulin glargine (LANTUS SOLOSTAR) 100 UNIT/ML Solostar Pen Inject 28 Units into the skin every morning.   Insulin Pen Needle (PEN NEEDLES) 32G X 4 MM MISC To use daily  with Lantus solostar. Dx E11.9   memantine (NAMENDA) 5 MG tablet Take 5 mg by mouth daily.   metFORMIN (GLUCOPHAGE-XR) 500 MG 24 hr tablet Take 4 tablets (2,000 mg total) by mouth daily.   metoprolol tartrate (LOPRESSOR) 50 MG tablet TAKE 1 TABLET BY MOUTH TWICE A DAY   montelukast (SINGULAIR) 10 MG tablet Take 1 tablet (10 mg total) by mouth at bedtime.   MULTIPLE VITAMIN PO Take 1 tablet by mouth daily.   Multiple Vitamins-Minerals (HAIR SKIN AND NAILS FORMULA) TABS Take 1 tablet by mouth daily. Gummie   nitroGLYCERIN (NITROSTAT) 0.4 MG SL tablet Place 1 tablet (0.4 mg total) under the tongue every 5 (five) minutes as needed for chest pain.   pantoprazole (PROTONIX) 40 MG tablet TAKE 1 TABLET(40 MG) BY MOUTH DAILY   REPATHA SURECLICK 627 MG/ML SOAJ ADMINISTER 1 ML UNDER THE SKIN EVERY 14 DAYS   Semaglutide (RYBELSUS) 14 MG TABS Take 14 mg by mouth daily.   Suvorexant 10 MG TABS Take 10 mg by mouth at bedtime.   tadalafil (CIALIS) 20 MG tablet Take 0.5-1 tablets (10-20 mg total) by mouth every other day as needed for erectile dysfunction.   vitamin C (ASCORBIC ACID) 250 MG tablet Take 500 mg by mouth daily. Gummie   No current facility-administered medications for this visit. (Other)      REVIEW OF SYSTEMS: ROS   Negative for: Constitutional, Gastrointestinal, Neurological, Skin, Genitourinary,  Musculoskeletal, HENT, Endocrine, Cardiovascular, Eyes, Respiratory, Psychiatric, Allergic/Imm, Heme/Lymph Last edited by Hurman Horn, MD on 02/03/2021  1:15 PM.       ALLERGIES Allergies  Allergen Reactions   Plavix [Clopidogrel Bisulfate] Shortness Of Breath   Oxycontin [Oxycodone Hcl] Other (See Comments)    Malaise   Penicillins Other (See Comments)    Cotton Mouth    Pioglitazone Other (See Comments)    Made his chest hurt   Prednisone Other (See Comments)    Reaction: muscle aches and soreness   Simvastatin Other (See Comments)    Myalgias    PAST MEDICAL HISTORY Past  Medical History:  Diagnosis Date   Adenomatous colon polyp 2000/2010   Arthritis    "lower back" (07/29/2016)   ASCVD (arteriosclerotic cardiovascular disease)    -luminal irregularities in 1998 and 2005; normal EF   Benign prostatic hypertrophy    status post transurethral resection of the prostate   CAD (coronary artery disease)    a. Cath 07/29/16 99% ostial RCA & 70% pRCA s/p cutting balloon angioplasty and single SYNERGY DES 2.5X28. 100% very Distal 1st RPLB lesion --> the occlusion site moved further distal with guidewire advancement;  40% ostial LAD; 50% Ost Cx to Prox Cx lesion.   Coronary artery disease    Dysphagia    Erectile dysfunction    GERD (gastroesophageal reflux disease)    Heart murmur    History of hiatal hernia    History of kidney stones    Hyperlipidemia    Hypertension    Seasonal allergies    Tobacco abuse    20 pack years; discontinued 1995   Type II diabetes mellitus ([place])    Past Surgical History:  Procedure Laterality Date   APPENDECTOMY     CATARACT EXTRACTION W/ INTRAOCULAR LENS  IMPLANT, BILATERAL Bilateral    COLONOSCOPY  07/20/2011   Dr. Gala Romney: multiple hyperplastic polyps. Surveillance 2018   COLONOSCOPY N/A 06/30/2016   Procedure: COLONOSCOPY;  Surgeon: Daneil Dolin, MD;  [facility]: [facility];  Service: Endoscopy;  Laterality: N/A;  10:00am   COLONOSCOPY W/ BIOPSIES AND POLYPECTOMY  07/08/08, 06/2011   Dr Madolyn Frieze papilla, diminutive rectal polyp ablated the, left-sided diverticula, piecemeal polypectomy of hepatic flexure adenomatous polyp, diminutive polyps near hepatic flexure ablated   CORONARY ANGIOPLASTY WITH STENT PLACEMENT  07/29/2016   CORONARY STENT INTERVENTION N/A 07/29/2016   Procedure: Coronary Stent Intervention;  Surgeon: Leonie Man, MD;  Location: [facility];  Service: Cardiovascular;  Laterality: N/A;  RCA   ESOPHAGOGASTRODUODENOSCOPY  2013   erosive reflux esophagitis, s/p empiric dilation. chronic duodenitis.     LEFT HEART CATH AND CORONARY ANGIOGRAPHY N/A 07/29/2016   Procedure: Left Heart Cath and Coronary Angiography;  Surgeon: Leonie Man, MD;  Location: [facility];  Service: Cardiovascular;  Laterality: N/A;   NASAL SEPTUM SURGERY     NASAL SINUS SURGERY     "cleaned out my sinus"   PACEMAKER IMPLANT N/A 10/15/2019   Procedure: PACEMAKER IMPLANT;  Surgeon: Sanda Klein, MD;  Location: [facility];  Service: Cardiovascular;  Laterality: N/A;   PERIPHERAL VASCULAR CATHETERIZATION N/A 11/26/2015   Procedure: Abdominal Aortogram w/Lower Extremity;  Surgeon: Wellington Hampshire, MD;  Location: [facility];  Service: Cardiovascular;  Laterality: N/A;   TONSILLECTOMY     TRANSURETHRAL RESECTION OF PROSTATE  2009    FAMILY HISTORY Family History  Problem Relation Age of Onset   Ovarian cancer Mother  Diabetes Brother    Colon cancer Sister 4       deceased from colon cancer   Diabetes Maternal Grandmother     SOCIAL HISTORY Social History   Tobacco Use   Smoking status: Former    Packs/day: 1.00    Years: 15.00    Pack years: 15.00    Types: Cigarettes    Start date: 03/08/1965    Quit date: 07/07/1993    Years since quitting: 27.5   Smokeless tobacco: Never  Vaping Use   Vaping Use: Never used  Substance Use Topics   Alcohol use: No    Alcohol/week: 0.0 standard drinks    Comment: 07/29/2016 "last drink was 4 wks ago; had a beer"   Drug use: No         OPHTHALMIC EXAM:  Base Eye Exam     Visual Acuity (ETDRS)       Right Left   Dist cc 20/15 -1 20/15    Correction: Glasses         Tonometry (Tonopen, 1:14 PM)       Right Left   Pressure 10 10         Pupils       Pupils APD   Right PERRL None   Left PERRL None         Visual Fields       Left Right    Full Full         Extraocular Movement       Right Left    Full, Ortho Full, Ortho         Neuro/Psych     Oriented x3: Yes   Mood/Affect: Normal          Dilation     Both eyes: 1.0% Mydriacyl, 2.5% Phenylephrine @ 1:14 PM           Slit Lamp and Fundus Exam     External Exam       Right Left   External Normal Normal         Slit Lamp Exam       Right Left   Lids/Lashes Normal Normal   Conjunctiva/Sclera White and quiet White and quiet   Cornea Clear Clear   Anterior Chamber Deep and quiet Deep and quiet   Iris Round and reactive Round and reactive   Lens Centered posterior chamber intraocular lens Centered posterior chamber intraocular lens   Anterior Vitreous Normal Normal         Fundus Exam       Right Left   Posterior Vitreous Posterior vitreous detachment Normal   Disc Normal Normal   C/D Ratio 0.3 0.35   Macula Microaneurysms, no macular thickening Microaneurysms, no macular thickening   Vessels NPDR-Severe NPDR-Severe   Periphery Normal Normal            IMAGING AND PROCEDURES  Imaging and Procedures for 02/03/21  OCT, Retina - OU - Both Eyes       Right Eye Quality was good. Scan locations included subfoveal. Central Foveal Thickness: 303. Progression has worsened. Findings include vitreomacular adhesion , abnormal foveal contour, cystoid macular edema.   Left Eye Quality was good. Scan locations included subfoveal. Central Foveal Thickness: 272. Progression has been stable. Findings include vitreomacular adhesion , normal foveal contour.   Notes Recurrence of macular CME OD nasal aspect to FAZ with a cluster of microaneurysms which we see in diabetic retinopathy but also in folks who have superimposed  nightly hypoxic damage from obstructive sleep apnea, not treated.     Color Fundus Photography Optos - OU - Both Eyes       Right Eye Progression has no prior data. Disc findings include normal observations. Macula : microaneurysms. Vessels : IRMA.   Left Eye Progression has no prior data. Disc findings include normal observations. Macula : microaneurysms. Vessels : IRMA.    Notes Moderate to severe nonproliferative diabetic retinopathy.  OD with clusters of microaneurysms during the FAZ             ASSESSMENT/PLAN:  OSA (obstructive sleep apnea) Patient reports noncompliance to CPAP" unable to tolerate the the mask and device.  He nonetheless reports not able to sleep with it but however he notes that without CPAP he still only sleeps an hour at a time thus some trying to point out to him that his inability to sleep with it may be his normal activity and in fact he might get even 2 or 3 hours of sleep consecutively with CPAP may be helpful in preventing the nightly hypoxic damage to the retina and to the remainder of the CNS as well as other systemic large organs  Severe nonproliferative diabetic retinopathy of right eye, with macular edema, associated with type 2 diabetes mellitus (HCC) OD, micro CME on the nasal aspect of the fovea with clusters of microaneurysms which we characteristically will see From untreated obstructive sleep apnea however this cannot be ascribed to being from that condition as he has moderate to severe nonproliferative diabetic retinopathy peripherally.  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 associated with type 2 diabetes mellitus (HCC)  O84.1660 OCT, Retina - OU - Both Eyes    2. Moderate nonproliferative diabetic retinopathy of right eye with macular edema associated with type 2 diabetes mellitus (HCC)   E11.3311 OCT, Retina - OU - Both Eyes    3. OSA (obstructive sleep apnea)  G47.33     4. Early stage nonexudative age-related macular degeneration of both eyes  H35.3131 Color Fundus Photography Optos - OU - Both Eyes    5. Vitreomacular adhesion of both eyes  H43.823     6. Severe nonproliferative diabetic retinopathy of right eye, with macular edema, associated with type 2 diabetes mellitus ([place])  E11.3411     7. Severe nonproliferative diabetic retinopathy of left eye without macular edema associated with type 2 diabetes mellitus ([place])  Y30.1601     8. Severe nonproliferative diabetic retinopathy of left eye associated with type 2 diabetes mellitus, macular edema presence unspecified ([place])  U93.2355       1.  OU with some progression of moderate nonproliferative diabetic retinopathy now to severe nonproliferative diabetic retinopathy with early macular edema OD recurrent.  OS no sign of CSME.  2.  We had some discussion regarding untreated OSA and its nightly hypoxic and hypertensive damage that occurs with high risk of stroke and heart attack if he goes untreated.  I  also explained the patient my experience with patients who have this kind of eye disease that have their treatment successful leak compliant with CPAP how it stops progression of diabetic eye disease or at least slows it down and decreases the number of treatments.  3.  Ophthalmic Meds Ordered this visit:  No orders of the defined types were placed in this encounter.      Return in about 4 months (around 06/04/2021) for DILATE OU, OCT.  There are no Patient Instructions on file for this visit.   Explained the diagnoses, plan, and follow up with the patient and they expressed understanding.  Patient expressed understanding of the importance of proper follow up care.   Clent Demark Elany Felix M.D. Diseases & Surgery of the Retina and Vitreous Retina & Diabetic [place] 02/03/21     Abbreviations: M myopia  (nearsighted); A astigmatism; H hyperopia (farsighted); P presbyopia; Mrx spectacle prescription;  CTL contact lenses; OD right eye; OS left eye; OU both eyes  XT exotropia; ET esotropia; PEK punctate epithelial keratitis; PEE punctate epithelial erosions; DES dry eye syndrome; MGD meibomian gland dysfunction; ATs artificial tears; PFAT's preservative free artificial tears; [place] nuclear sclerotic cataract; PSC posterior subcapsular cataract; ERM epi-retinal membrane; PVD posterior vitreous detachment; RD retinal detachment; DM diabetes mellitus; DR diabetic retinopathy; NPDR non-proliferative diabetic retinopathy; PDR proliferative diabetic retinopathy; CSME clinically significant macular edema; DME diabetic macular edema; dbh dot blot hemorrhages; CWS cotton wool spot; POAG primary open angle glaucoma; C/D cup-to-disc ratio; HVF humphrey visual field; GVF goldmann visual field; OCT optical coherence tomography; IOP intraocular pressure; BRVO Branch retinal vein occlusion; CRVO central retinal vein occlusion; CRAO central retinal artery occlusion; BRAO branch retinal artery occlusion; RT retinal tear; SB scleral buckle; PPV pars plana vitrectomy; VH Vitreous hemorrhage; PRP panretinal laser photocoagulation; IVK intravitreal kenalog; VMT vitreomacular traction; MH Macular hole;  NVD neovascularization of the disc; NVE neovascularization elsewhere; AREDS age related eye disease study; ARMD age related macular degeneration; POAG primary open angle glaucoma; EBMD epithelial/anterior basement membrane dystrophy; ACIOL anterior chamber intraocular lens; IOL intraocular lens; PCIOL posterior chamber intraocular lens; Phaco/IOL phacoemulsification with intraocular lens placement; [place] photorefractive keratectomy; LASIK laser assisted in situ keratomileusis; HTN hypertension; DM diabetes mellitus; COPD chronic obstructive pulmonary disease

## 2021-02-03 NOTE — Assessment & Plan Note (Signed)
No signs of progression to macular edema OS

## 2021-02-03 NOTE — Assessment & Plan Note (Signed)
Patient reports noncompliance to CPAP" unable to tolerate the the mask and device.  He nonetheless reports not able to sleep with it but however he notes that without CPAP he still only sleeps an hour at a time thus some trying to point out to him that his inability to sleep with it may be his normal activity and in fact he might get even 2 or 3 hours of sleep consecutively with CPAP may be helpful in preventing the nightly hypoxic damage to the retina and to the remainder of the CNS as well as other systemic large organs

## 2021-02-03 NOTE — Assessment & Plan Note (Signed)
Physiologic OU no impact on acuity

## 2021-02-18 ENCOUNTER — Other Ambulatory Visit: Payer: Self-pay | Admitting: Endocrinology

## 2021-03-01 ENCOUNTER — Other Ambulatory Visit: Payer: Self-pay | Admitting: Endocrinology

## 2021-03-01 DIAGNOSIS — E119 Type 2 diabetes mellitus without complications: Secondary | ICD-10-CM

## 2021-03-19 DIAGNOSIS — X32XXXD Exposure to sunlight, subsequent encounter: Secondary | ICD-10-CM | POA: Diagnosis not present

## 2021-03-19 DIAGNOSIS — L570 Actinic keratosis: Secondary | ICD-10-CM | POA: Diagnosis not present

## 2021-03-31 ENCOUNTER — Ambulatory Visit: Payer: Medicare PPO | Admitting: Cardiovascular Disease

## 2021-03-31 ENCOUNTER — Encounter: Payer: Self-pay | Admitting: Cardiovascular Disease

## 2021-03-31 ENCOUNTER — Other Ambulatory Visit: Payer: Self-pay

## 2021-03-31 VITALS — BP 130/60 | HR 85 | Resp 20 | Ht 66.0 in | Wt 176.6 lb

## 2021-03-31 DIAGNOSIS — I739 Peripheral vascular disease, unspecified: Secondary | ICD-10-CM

## 2021-03-31 DIAGNOSIS — I6523 Occlusion and stenosis of bilateral carotid arteries: Secondary | ICD-10-CM

## 2021-03-31 DIAGNOSIS — I10 Essential (primary) hypertension: Secondary | ICD-10-CM | POA: Diagnosis not present

## 2021-03-31 DIAGNOSIS — E785 Hyperlipidemia, unspecified: Secondary | ICD-10-CM | POA: Diagnosis not present

## 2021-03-31 DIAGNOSIS — I2510 Atherosclerotic heart disease of native coronary artery without angina pectoris: Secondary | ICD-10-CM

## 2021-03-31 DIAGNOSIS — I350 Nonrheumatic aortic (valve) stenosis: Secondary | ICD-10-CM

## 2021-03-31 DIAGNOSIS — I495 Sick sinus syndrome: Secondary | ICD-10-CM | POA: Diagnosis not present

## 2021-03-31 NOTE — Progress Notes (Signed)
Cardiology Office Note   Date:  03/31/2021   ID:  Jarick, Harkins Jul 29, 1940, MRN [id number]  PCP:  Tomasa Hose, NP  Cardiologist: Dr. Fletcher Anon  No chief complaint on file.      History of Present Illness: Marcus Norton is a 81 y.o. male who presents for a follow-up visit regarding peripheral arterial disease, PSVT, carotid disease and aortic stenosis.   He has known history of  paroxysmal supraventricular tachycardia,  coronary artery disease, aortic stenosis, type 2 diabetes since 1990, hypertension and hyperlipidemia. He has known history of claudication . He did not tolerate cilostazol due to palpitations. He is s/p bilateral common iliac artery kissing stent placement in 11/2015. 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   He had an abnormal nuclear stress test in 2018.  Cardiac catheterization in June, 2018 showed severe ostial RCA stenosis and occluded RPL2 with nonobstructive disease affecting the left system.  He underwent successful angioplasty and drug-eluting stent placement to the ostial right coronary artery. He has known history of intolerance to statins and was able to tolerate only small dose of atorvastatin and Repatha.   He is known to have aortic stenosis with most recent echocardiogram in September 2022 showing moderate stenosis which has been stable. He is status post pacemaker placement for tachybradycardia syndrome.    He has been doing well with no recent chest pain or worsening dyspnea.  He denies claudication.  He takes his medications regularly.  Past Medical History:  Diagnosis Date   Adenomatous colon polyp 2000/2010   Arthritis    "lower back" (07/29/2016)   ASCVD (arteriosclerotic cardiovascular disease)    -luminal irregularities in 1998 and 2005; normal EF   Benign prostatic hypertrophy    status post  transurethral resection of the prostate   CAD (coronary artery disease)    a. Cath 07/29/16 99% ostial RCA & 70% pRCA s/p cutting balloon angioplasty and single SYNERGY DES 2.5X28. 100% very Distal 1st RPLB lesion --> the occlusion site moved further distal with guidewire advancement;  40% ostial LAD; 50% Ost Cx to Prox Cx lesion.   Coronary artery disease    Dysphagia    Erectile dysfunction    GERD (gastroesophageal reflux disease)    Heart murmur    History of hiatal hernia    History of kidney stones    Hyperlipidemia    Hypertension    Seasonal allergies    Tobacco abuse    20 pack years; discontinued 1995   Type II diabetes mellitus ([place])     Past Surgical History:  Procedure Laterality Date   APPENDECTOMY     CATARACT EXTRACTION W/ INTRAOCULAR LENS  IMPLANT, BILATERAL Bilateral    COLONOSCOPY  07/20/2011   Dr. Gala Romney: multiple hyperplastic polyps. Surveillance 2018   COLONOSCOPY N/A 06/30/2016   Procedure: COLONOSCOPY;  Surgeon: Daneil Dolin, MD;  [facility];  Service: Endoscopy;  Laterality: N/A;  10:00am   COLONOSCOPY W/ BIOPSIES AND POLYPECTOMY  07/08/08, 06/2011   Dr Madolyn Frieze papilla, diminutive rectal polyp ablated the, left-sided diverticula, piecemeal polypectomy of hepatic flexure adenomatous polyp, diminutive polyps near hepatic flexure ablated   CORONARY ANGIOPLASTY WITH STENT PLACEMENT  07/29/2016   CORONARY STENT INTERVENTION N/A 07/29/2016   Procedure: Coronary Stent Intervention;  Surgeon: Leonie Man, MD;  Location: [facility];  Service: Cardiovascular;  Laterality: N/A;  RCA   ESOPHAGOGASTRODUODENOSCOPY  2013   erosive reflux esophagitis, s/p empiric dilation. chronic duodenitis.    LEFT HEART CATH AND CORONARY ANGIOGRAPHY N/A 07/29/2016   Procedure: Left Heart Cath and Coronary Angiography;  Surgeon: Leonie Man, MD;  Location: [facility];  Service: Cardiovascular;  Laterality: N/A;   NASAL SEPTUM SURGERY     NASAL SINUS  SURGERY     "cleaned out my sinus"   PACEMAKER IMPLANT N/A 10/15/2019   Procedure: PACEMAKER IMPLANT;  Surgeon: Sanda Klein, MD;  Location: [facility];  Service: Cardiovascular;  Laterality: N/A;   PERIPHERAL VASCULAR CATHETERIZATION N/A 11/26/2015   Procedure: Abdominal Aortogram w/Lower Extremity;  Surgeon: Wellington Hampshire, MD;  Location: [facility];  Service: Cardiovascular;  Laterality: N/A;   TONSILLECTOMY     TRANSURETHRAL RESECTION OF PROSTATE  2009     Current Outpatient Medications  Medication Sig Dispense Refill   ACCU-CHEK AVIVA PLUS test strip USE AS DIRECTED 50 strip 0   alfuzosin (UROXATRAL) 10 MG 24 hr tablet Take 10 mg by mouth daily with breakfast.     amLODipine (NORVASC) 5 MG tablet Take 1 tablet (5 mg total) by mouth daily. 90 tablet 1   aspirin EC 81 MG tablet Take 81 mg by mouth at bedtime.      atorvastatin (LIPITOR) 10 MG tablet Take 1 tablet (10 mg total) by mouth once a week. (Patient taking differently: Take 10 mg by mouth every Wednesday.) 90 tablet 1   Cholecalciferol (VITAMIN D3) 50 MCG (2000 UT) TABS Take 2,000 Units by mouth daily.     Continuous Blood Gluc Sensor (FREESTYLE LIBRE 2 SENSOR) MISC 1 Device by Does not apply route every 14 (fourteen) days. 6 each 3   fluorometholone (FML) 0.1 % ophthalmic suspension 1 drop 2 (two) times daily.     fluticasone furoate-vilanterol (BREO ELLIPTA) 100-25 MCG/INH AEPB Inhale 1 puff into the lungs daily. 180 each 2   insulin glargine (LANTUS SOLOSTAR) 100 UNIT/ML Solostar Pen Inject 28 Units into the skin every morning. 30 mL 3   Insulin Pen Needle (PEN NEEDLES) 32G X 4 MM MISC To use daily with Lantus solostar. Dx E11.9 100 each 1   memantine (NAMENDA) 5 MG tablet Take 5 mg by mouth daily.     metFORMIN (GLUCOPHAGE-XR) 500 MG 24 hr tablet TAKE 4 TABLETS BY MOUTH EVERY DAY 360 tablet 1   metoprolol tartrate (LOPRESSOR) 50 MG tablet TAKE 1 TABLET BY MOUTH TWICE A DAY 180 tablet 3   montelukast  (SINGULAIR) 10 MG tablet Take 1 tablet (10 mg total) by mouth at bedtime. 30 tablet 11   MULTIPLE VITAMIN PO Take 1 tablet by mouth daily.     Multiple Vitamins-Minerals (HAIR SKIN AND NAILS FORMULA) TABS Take 1 tablet by mouth daily. Gummie     nitroGLYCERIN (NITROSTAT) 0.4 MG SL tablet Place 1 tablet (0.4 mg total) under the tongue every 5 (five) minutes as needed for chest pain. 5 tablet 0   pantoprazole (PROTONIX) 40 MG tablet TAKE 1 TABLET(40 MG) BY MOUTH DAILY 90 tablet 1   REPATHA SURECLICK 564 MG/ML SOAJ ADMINISTER 1 ML UNDER THE SKIN EVERY 14 DAYS 2 mL 11   Semaglutide (RYBELSUS) 14 MG TABS Take 14 mg by mouth daily. 90 tablet 3   Suvorexant 10 MG TABS Take 10 mg by mouth at bedtime. 30 tablet 0   tadalafil (CIALIS) 20 MG tablet Take 0.5-1 tablets (  10-20 mg total) by mouth every other day as needed for erectile dysfunction. 5 tablet 1   vitamin C (ASCORBIC ACID) 250 MG tablet Take 500 mg by mouth daily. Gummie     No current facility-administered medications for this visit.    Allergies:   Plavix [clopidogrel bisulfate], Oxycontin [oxycodone hcl], Penicillins, Pioglitazone, Prednisone, and Simvastatin    Social History:  The patient  reports that he quit smoking about 27 years ago. His smoking use included cigarettes. He started smoking about 56 years ago. He has a 15.00 pack-year smoking history. He has never used smokeless tobacco. He reports that he does not drink alcohol and does not use drugs.   Family History:  The patient's family history includes Colon cancer (age of onset: 59) in his sister; Diabetes in his brother and maternal grandmother; Ovarian cancer in his mother.    ROS:  Please see the history of present illness.   Otherwise, review of systems are positive for none.   All other systems are reviewed and negative.    PHYSICAL EXAM: VS:  BP 130/60 (BP Location: Left Arm, Patient Position: Sitting, Cuff Size: Normal)    Pulse 85    Resp 20    Ht 5\' 6"  (1.676 m)    Wt  176 lb 9.6 oz (80.1 kg)    SpO2 96%    BMI 28.50 kg/m  , BMI Body mass index is 28.5 kg/m. GEN: Well nourished, well developed, in no acute distress  HEENT: normal  Neck: no JVD, carotid bruits, or masses Cardiac: RRR; no  rubs, or gallops,no edema . 2/6 crescendo decrescendo systolic murmur in the aortic area which is mid peaking Respiratory:  clear to auscultation bilaterally, normal work of breathing GI: soft, nontender, nondistended, + BS MS: no deformity or atrophy  Skin: warm and dry, no rash Neuro:  Strength and sensation are intact Psych: euthymic mood, full affect Vascular: Femoral pulse: Normal bilaterally.  Distal pulses are not palpable     Recent Labs: 09/30/2020: BUN 19; Creatinine, Ser 0.81; Hemoglobin 14.3; Platelets 271; Potassium 4.3; Sodium 136    Lipid Panel    Component Value Date/Time   CHOL 57 12/20/2019 0903   CHOL 75 (L) 02/06/2019 1052   TRIG 50.0 12/20/2019 0903   TRIG 96 03/12/2008 0000   HDL 34.50 (L) 12/20/2019 0903   HDL 37 (L) 02/06/2019 1052   CHOLHDL 2 12/20/2019 0903   VLDL 10.0 12/20/2019 0903   LDLCALC 12 12/20/2019 0903   LDLCALC 24 02/06/2019 1052   LDLCALC 91 03/12/2008 0000      Wt Readings from Last 3 Encounters:  03/31/21 176 lb 9.6 oz (80.1 kg)  01/07/21 178 lb (80.7 kg)  12/29/20 177 lb 3.2 oz (80.4 kg)       No flowsheet data found.    ASSESSMENT AND PLAN:  1.  Peripheral arterial disease: No significant claudication.  I reviewed most recent Doppler studies with him which were done in June and showed patent iliac stents with stable ABI.  Repeat studies in June of this year.  2. Coronary artery disease involving native coronary arteries without angina: Continue aggressive medical therapy.    3. Hyperlipidemia: He is currently on atorvastatin and Repatha with most recent LDL of 12.  He is due for a repeat lipid profile which hopefully will be done with his next lab with his primary care physician.  4. Essential  hypertension: Blood pressure is well controlled on current medications.  5.  Moderate  aortic stenosis: He is asymptomatic.  I requested a repeat echocardiogram to be done in September of this year.  6.  Tachycardia-bradycardia syndrome, status post dual-chamber pacemaker placement .  Continue to follow-up with Dr. Loletha Grayer.  7.  Moderate bilateral carotid disease: T this was noted on CT scan.  However, most recent carotid Doppler showed mild stenosis overall.   Disposition:   FU with me in 6 months  Signed,  [name], MD  03/31/2021 10:01 AM    [place]

## 2021-03-31 NOTE — Patient Instructions (Signed)

## 2021-04-11 ENCOUNTER — Other Ambulatory Visit: Payer: Self-pay | Admitting: Endocrinology

## 2021-04-12 ENCOUNTER — Other Ambulatory Visit: Payer: Self-pay | Admitting: Endocrinology

## 2021-04-12 DIAGNOSIS — E119 Type 2 diabetes mellitus without complications: Secondary | ICD-10-CM

## 2021-04-14 ENCOUNTER — Ambulatory Visit (INDEPENDENT_AMBULATORY_CARE_PROVIDER_SITE_OTHER): Payer: Medicare PPO

## 2021-04-14 DIAGNOSIS — I495 Sick sinus syndrome: Secondary | ICD-10-CM | POA: Diagnosis not present

## 2021-04-14 DIAGNOSIS — E119 Type 2 diabetes mellitus without complications: Secondary | ICD-10-CM | POA: Diagnosis not present

## 2021-04-15 LAB — CUP PACEART REMOTE DEVICE CHECK
Battery Remaining Longevity: 111 mo
Battery Remaining Percentage: 91 %
Battery Voltage: 3.02 V
Brady Statistic AP VP Percent: 1 %
Brady Statistic AP VS Percent: 6.1 %
Brady Statistic AS VP Percent: 1 %
Brady Statistic AS VS Percent: 94 %
Brady Statistic RA Percent Paced: 5.6 %
Brady Statistic RV Percent Paced: 1 %
Date Time Interrogation Session: 20230221163723
Implantable Lead Implant Date: 20210823
Implantable Lead Implant Date: 20210823
Implantable Lead Location: 753859
Implantable Lead Location: 753860
Implantable Pulse Generator Implant Date: 20210823
Lead Channel Impedance Value: 390 Ohm
Lead Channel Impedance Value: 590 Ohm
Lead Channel Pacing Threshold Amplitude: 0.5 V
Lead Channel Pacing Threshold Amplitude: 0.75 V
Lead Channel Pacing Threshold Pulse Width: 0.5 ms
Lead Channel Pacing Threshold Pulse Width: 0.5 ms
Lead Channel Sensing Intrinsic Amplitude: 11.3 mV
Lead Channel Sensing Intrinsic Amplitude: 3.1 mV
Lead Channel Setting Pacing Amplitude: 2 V
Lead Channel Setting Pacing Amplitude: 2 V
Lead Channel Setting Pacing Pulse Width: 0.5 ms
Lead Channel Setting Sensing Sensitivity: 2 mV
Pulse Gen Model: 2272
Pulse Gen Serial Number: 3859349

## 2021-04-20 DIAGNOSIS — I10 Essential (primary) hypertension: Secondary | ICD-10-CM | POA: Diagnosis not present

## 2021-04-20 DIAGNOSIS — I471 Supraventricular tachycardia: Secondary | ICD-10-CM | POA: Diagnosis not present

## 2021-04-20 DIAGNOSIS — E119 Type 2 diabetes mellitus without complications: Secondary | ICD-10-CM | POA: Diagnosis not present

## 2021-04-20 DIAGNOSIS — I739 Peripheral vascular disease, unspecified: Secondary | ICD-10-CM | POA: Diagnosis not present

## 2021-04-20 DIAGNOSIS — I25118 Atherosclerotic heart disease of native coronary artery with other forms of angina pectoris: Secondary | ICD-10-CM | POA: Diagnosis not present

## 2021-04-20 DIAGNOSIS — G4733 Obstructive sleep apnea (adult) (pediatric): Secondary | ICD-10-CM | POA: Diagnosis not present

## 2021-04-20 DIAGNOSIS — E782 Mixed hyperlipidemia: Secondary | ICD-10-CM | POA: Diagnosis not present

## 2021-04-20 DIAGNOSIS — E113311 Type 2 diabetes mellitus with moderate nonproliferative diabetic retinopathy with macular edema, right eye: Secondary | ICD-10-CM | POA: Diagnosis not present

## 2021-04-20 DIAGNOSIS — Z950 Presence of cardiac pacemaker: Secondary | ICD-10-CM | POA: Diagnosis not present

## 2021-04-21 NOTE — Progress Notes (Signed)
Remote pacemaker transmission.   

## 2021-05-04 ENCOUNTER — Ambulatory Visit: Payer: Medicare PPO | Admitting: Endocrinology

## 2021-05-09 DIAGNOSIS — E785 Hyperlipidemia, unspecified: Secondary | ICD-10-CM | POA: Diagnosis not present

## 2021-05-09 DIAGNOSIS — Z794 Long term (current) use of insulin: Secondary | ICD-10-CM | POA: Diagnosis not present

## 2021-05-09 DIAGNOSIS — E1151 Type 2 diabetes mellitus with diabetic peripheral angiopathy without gangrene: Secondary | ICD-10-CM | POA: Diagnosis not present

## 2021-05-09 DIAGNOSIS — E1165 Type 2 diabetes mellitus with hyperglycemia: Secondary | ICD-10-CM | POA: Diagnosis not present

## 2021-05-09 DIAGNOSIS — H04129 Dry eye syndrome of unspecified lacrimal gland: Secondary | ICD-10-CM | POA: Diagnosis not present

## 2021-05-09 DIAGNOSIS — F03A0 Unspecified dementia, mild, without behavioral disturbance, psychotic disturbance, mood disturbance, and anxiety: Secondary | ICD-10-CM | POA: Diagnosis not present

## 2021-05-09 DIAGNOSIS — I10 Essential (primary) hypertension: Secondary | ICD-10-CM | POA: Diagnosis not present

## 2021-05-09 DIAGNOSIS — J42 Unspecified chronic bronchitis: Secondary | ICD-10-CM | POA: Diagnosis not present

## 2021-05-09 DIAGNOSIS — I25119 Atherosclerotic heart disease of native coronary artery with unspecified angina pectoris: Secondary | ICD-10-CM | POA: Diagnosis not present

## 2021-05-12 ENCOUNTER — Other Ambulatory Visit: Payer: Self-pay | Admitting: Endocrinology

## 2021-05-12 DIAGNOSIS — E119 Type 2 diabetes mellitus without complications: Secondary | ICD-10-CM

## 2021-06-04 ENCOUNTER — Encounter (INDEPENDENT_AMBULATORY_CARE_PROVIDER_SITE_OTHER): Payer: Medicare PPO | Admitting: Ophthalmology

## 2021-06-08 ENCOUNTER — Other Ambulatory Visit: Payer: Self-pay | Admitting: Endocrinology

## 2021-06-08 DIAGNOSIS — E119 Type 2 diabetes mellitus without complications: Secondary | ICD-10-CM

## 2021-06-11 ENCOUNTER — Ambulatory Visit (INDEPENDENT_AMBULATORY_CARE_PROVIDER_SITE_OTHER): Payer: Medicare PPO | Admitting: Ophthalmology

## 2021-06-11 ENCOUNTER — Encounter (INDEPENDENT_AMBULATORY_CARE_PROVIDER_SITE_OTHER): Payer: Self-pay | Admitting: Ophthalmology

## 2021-06-11 DIAGNOSIS — E113492 Type 2 diabetes mellitus with severe nonproliferative diabetic retinopathy without macular edema, left eye: Secondary | ICD-10-CM | POA: Diagnosis not present

## 2021-06-11 DIAGNOSIS — E113411 Type 2 diabetes mellitus with severe nonproliferative diabetic retinopathy with macular edema, right eye: Secondary | ICD-10-CM

## 2021-06-11 DIAGNOSIS — E113312 Type 2 diabetes mellitus with moderate nonproliferative diabetic retinopathy with macular edema, left eye: Secondary | ICD-10-CM

## 2021-06-11 DIAGNOSIS — E113311 Type 2 diabetes mellitus with moderate nonproliferative diabetic retinopathy with macular edema, right eye: Secondary | ICD-10-CM

## 2021-06-11 DIAGNOSIS — H43823 Vitreomacular adhesion, bilateral: Secondary | ICD-10-CM | POA: Diagnosis not present

## 2021-06-11 NOTE — Assessment & Plan Note (Signed)
OD, stable overall with center involved CME likely from [facility], will not treat but observed with good acuity ?

## 2021-06-11 NOTE — Assessment & Plan Note (Signed)
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

## 2021-06-11 NOTE — Progress Notes (Signed)
? ? ?06/11/2021 ? ?  ? ?CHIEF COMPLAINT ?Patient presents for  ?Chief Complaint  ?Patient presents with  ? Diabetic Retinopathy with Macular Edema  ? ? ? ? ?HISTORY OF PRESENT ILLNESS: ?Marcus Norton is a 81 y.o. male who presents to the clinic today for:  ? ?HPI   ?4 mos dilate OU, OCT. ?Patient states vision is stable and unchanged since last visit. Denies any new floaters or FOL. ?Pt states he saw Dr. Jorja Loa, OD within the last few months, and he received a new glasses prescription which he is currently wearing. ?Pt reports he does not use a CPAP machine, he states "I tried it, I need it, It has been a couple years. My doctor is going to put me back on it, some kind of system but we are working on that now." ?LBS: this morning patient reports "about 150." ?Last edited by Laurin Coder on 06/11/2021  9:12 AM.  ?  ? ? ?Referring physician: ?Madelin Headings, DO ?[address] ?[place],  [place] [address] ? ?HISTORICAL INFORMATION:  ? ?Selected notes from the [facility] ?  ? ?Lab Results  ?Component Value Date  ? HGBA1C 7.4 (A) 01/07/2021  ?  ? ?CURRENT MEDICATIONS: ?Current Outpatient Medications (Ophthalmic Drugs)  ?Medication Sig  ? fluorometholone (FML) 0.1 % ophthalmic suspension 1 drop 2 (two) times daily.  ? ?No current facility-administered medications for this visit. (Ophthalmic Drugs)  ? ?Current Outpatient Medications (Other)  ?Medication Sig  ? ACCU-CHEK AVIVA PLUS test strip USE AS DIRECTED  ? alfuzosin (UROXATRAL) 10 MG 24 hr tablet Take 10 mg by mouth daily with breakfast.  ? amLODipine (NORVASC) 5 MG tablet Take 1 tablet (5 mg total) by mouth daily.  ? aspirin EC 81 MG tablet Take 81 mg by mouth at bedtime.   ? atorvastatin (LIPITOR) 10 MG tablet Take 1 tablet (10 mg total) by mouth once a week. (Patient taking differently: Take 10 mg by mouth every Wednesday.)  ? Cholecalciferol (VITAMIN D3) 50 MCG (2000 UT) TABS Take 2,000 Units by mouth daily.  ? Continuous Blood Gluc Sensor (FREESTYLE  LIBRE 2 SENSOR) MISC 1 Device by Does not apply route every 14 (fourteen) days.  ? fluticasone furoate-vilanterol (BREO ELLIPTA) 100-25 MCG/INH AEPB Inhale 1 puff into the lungs daily.  ? insulin glargine (LANTUS SOLOSTAR) 100 UNIT/ML Solostar Pen Inject 28 Units into the skin every morning.  ? Insulin Pen Needle (PEN NEEDLES) 32G X 4 MM MISC To use daily with Lantus solostar. Dx E11.9  ? memantine (NAMENDA) 5 MG tablet Take 5 mg by mouth daily.  ? metFORMIN (GLUCOPHAGE-XR) 500 MG 24 hr tablet TAKE 4 TABLETS BY MOUTH EVERY DAY  ? metoprolol tartrate (LOPRESSOR) 50 MG tablet TAKE 1 TABLET BY MOUTH TWICE A DAY  ? montelukast (SINGULAIR) 10 MG tablet Take 1 tablet (10 mg total) by mouth at bedtime.  ? MULTIPLE VITAMIN PO Take 1 tablet by mouth daily.  ? Multiple Vitamins-Minerals (HAIR SKIN AND NAILS FORMULA) TABS Take 1 tablet by mouth daily. Gummie  ? nitroGLYCERIN (NITROSTAT) 0.4 MG SL tablet Place 1 tablet (0.4 mg total) under the tongue every 5 (five) minutes as needed for chest pain.  ? pantoprazole (PROTONIX) 40 MG tablet TAKE 1 TABLET(40 MG) BY MOUTH DAILY  ? REPATHA SURECLICK 967 MG/ML SOAJ ADMINISTER 1 ML UNDER THE SKIN EVERY 14 DAYS  ? RYBELSUS 14 MG TABS TAKE 1 TABLET BY MOUTH EVERY DAY  ? Suvorexant 10 MG TABS Take 10  mg by mouth at bedtime.  ? tadalafil (CIALIS) 20 MG tablet Take 0.5-1 tablets (10-20 mg total) by mouth every other day as needed for erectile dysfunction.  ? vitamin C (ASCORBIC ACID) 250 MG tablet Take 500 mg by mouth daily. Gummie  ? ?No current facility-administered medications for this visit. (Other)  ? ? ? ? ?REVIEW OF SYSTEMS: ?ROS   ?Negative for: Constitutional, Gastrointestinal, Neurological, Skin, Genitourinary, Musculoskeletal, HENT, Endocrine, Cardiovascular, Eyes, Respiratory, Psychiatric, Allergic/Imm, Heme/Lymph ?Last edited by Hurman Horn, MD on 06/11/2021  9:54 AM.  ?  ? ? ? ?ALLERGIES ?Allergies  ?Allergen Reactions  ? Plavix [Clopidogrel Bisulfate] Shortness Of Breath  ?  Oxycontin [Oxycodone Hcl] Other (See Comments)  ?  Malaise  ? Penicillins Other (See Comments)  ?  [place] Mouth ?  ? Pioglitazone Other (See Comments)  ?  Made his chest hurt  ? Prednisone Other (See Comments)  ?  Reaction: muscle aches and soreness  ? Simvastatin Other (See Comments)  ?  Myalgias  ? ? ?PAST MEDICAL HISTORY ?Past Medical History:  ?Diagnosis Date  ? Adenomatous colon polyp 2000/2010  ? Arthritis   ? "lower back" (07/29/2016)  ? ASCVD (arteriosclerotic cardiovascular disease)   ? -luminal irregularities in 1998 and 2005; normal EF  ? Benign prostatic hypertrophy   ? status post transurethral resection of the prostate  ? CAD (coronary artery disease)   ? a. Cath 07/29/16 99% ostial RCA & 70% pRCA s/p cutting balloon angioplasty and single SYNERGY DES 2.5X28. 100% very Distal 1st RPLB lesion --> the occlusion site moved further distal with guidewire advancement;  40% ostial LAD; 50% Ost Cx to Prox Cx lesion.  ? Coronary artery disease   ? Dysphagia   ? Erectile dysfunction   ? GERD (gastroesophageal reflux disease)   ? Heart murmur   ? History of hiatal hernia   ? History of kidney stones   ? Hyperlipidemia   ? Hypertension   ? Seasonal allergies   ? Tobacco abuse   ? 20 pack years; discontinued 1995  ? Type II diabetes mellitus ([place])   ? ?Past Surgical History:  ?Procedure Laterality Date  ? APPENDECTOMY    ? CATARACT EXTRACTION W/ INTRAOCULAR LENS  IMPLANT, BILATERAL Bilateral   ? COLONOSCOPY  07/20/2011  ? Dr. Gala Romney: multiple hyperplastic polyps. Surveillance 2018  ? COLONOSCOPY N/A 06/30/2016  ? Procedure: COLONOSCOPY;  Surgeon: Daneil Dolin, MD;  [facility];  Service: Endoscopy;  Laterality: N/A;  10:00am  ? COLONOSCOPY W/ BIOPSIES AND POLYPECTOMY  07/08/08, 06/2011  ? Dr Madolyn Frieze papilla, diminutive rectal polyp ablated the, left-sided diverticula, piecemeal polypectomy of hepatic flexure adenomatous polyp, diminutive polyps near hepatic flexure ablated  ? CORONARY ANGIOPLASTY WITH  STENT PLACEMENT  07/29/2016  ? CORONARY STENT INTERVENTION N/A 07/29/2016  ? Procedure: Coronary Stent Intervention;  Surgeon: Leonie Man, MD;  Location: [facility];  Service: Cardiovascular;  Laterality: N/A;  RCA  ? ESOPHAGOGASTRODUODENOSCOPY  2013  ? erosive reflux esophagitis, s/p empiric dilation. chronic duodenitis.   ? LEFT HEART CATH AND CORONARY ANGIOGRAPHY N/A 07/29/2016  ? Procedure: Left Heart Cath and Coronary Angiography;  Surgeon: Leonie Man, MD;  Location: [facility];  Service: Cardiovascular;  Laterality: N/A;  ? NASAL SEPTUM SURGERY    ? NASAL SINUS SURGERY    ? "cleaned out my sinus"  ? PACEMAKER IMPLANT N/A 10/15/2019  ? Procedure: PACEMAKER IMPLANT;  Surgeon: Sanda Klein, MD;  Location: [facility];  Service: Cardiovascular;  Laterality: N/A;  ? PERIPHERAL VASCULAR CATHETERIZATION N/A 11/26/2015  ? Procedure: Abdominal Aortogram w/Lower Extremity;  Surgeon: Wellington Hampshire, MD;  Location: [facility];  Service: Cardiovascular;  Laterality: N/A;  ? TONSILLECTOMY    ? TRANSURETHRAL RESECTION OF PROSTATE  2009  ? ? ?FAMILY HISTORY ?Family History  ?Problem Relation Age of Onset  ? Ovarian cancer Mother   ? Diabetes Brother   ? Colon cancer Sister 50  ?     deceased from colon cancer  ? Diabetes Maternal Grandmother   ? ? ?SOCIAL HISTORY ?Social History  ? ?Tobacco Use  ? Smoking status: Former  ?  Packs/day: 1.00  ?  Years: 15.00  ?  Pack years: 15.00  ?  Types: Cigarettes  ?  Start date: 03/08/1965  ?  Quit date: 07/07/1993  ?  Years since quitting: 27.9  ? Smokeless tobacco: Never  ?Vaping Use  ? Vaping Use: Never used  ?Substance Use Topics  ? Alcohol use: No  ?  Alcohol/week: 0.0 standard drinks  ?  Comment: 07/29/2016 "last drink was 4 wks ago; had a beer"  ? Drug use: No  ? ?  ? ?  ? ?OPHTHALMIC EXAM: ? ?Base Eye Exam   ? ? Visual Acuity (ETDRS)   ? ?   Right Left  ? Dist cc 20/20 20/20  ? ? Correction: Glasses  ? ?  ?  ? ? Tonometry (Tonopen, 9:11 AM)    ? ?   Right Left  ? Pressure 8 7  ? ?  ?  ? ? Pupils   ? ?   Pupils APD  ? Right PERRL None  ? Left PERRL None  ? ?  ?  ? ? Visual Fields (Counting fingers)   ? ?   Left Right  ?  Full Full  ? ?  ?  ? ? Extr

## 2021-06-11 NOTE — Assessment & Plan Note (Signed)
OD see comments above, OS minor ?

## 2021-06-26 ENCOUNTER — Encounter (HOSPITAL_COMMUNITY): Payer: Self-pay | Admitting: *Deleted

## 2021-06-26 ENCOUNTER — Emergency Department (HOSPITAL_COMMUNITY)
Admission: EM | Admit: 2021-06-26 | Discharge: 2021-06-26 | Disposition: A | Discharge status: Home / Self Care | Payer: Medicare PPO | Attending: Emergency Medicine | Admitting: Emergency Medicine

## 2021-06-26 ENCOUNTER — Other Ambulatory Visit: Payer: Self-pay

## 2021-06-26 DIAGNOSIS — R002 Palpitations: Secondary | ICD-10-CM | POA: Diagnosis not present

## 2021-06-26 DIAGNOSIS — R000 Tachycardia, unspecified: Secondary | ICD-10-CM | POA: Insufficient documentation

## 2021-06-26 DIAGNOSIS — I10 Essential (primary) hypertension: Secondary | ICD-10-CM | POA: Insufficient documentation

## 2021-06-26 DIAGNOSIS — R001 Bradycardia, unspecified: Secondary | ICD-10-CM | POA: Insufficient documentation

## 2021-06-26 DIAGNOSIS — Z950 Presence of cardiac pacemaker: Secondary | ICD-10-CM | POA: Insufficient documentation

## 2021-06-26 DIAGNOSIS — Z7984 Long term (current) use of oral hypoglycemic drugs: Secondary | ICD-10-CM | POA: Insufficient documentation

## 2021-06-26 DIAGNOSIS — Z794 Long term (current) use of insulin: Secondary | ICD-10-CM | POA: Insufficient documentation

## 2021-06-26 DIAGNOSIS — Z7982 Long term (current) use of aspirin: Secondary | ICD-10-CM | POA: Diagnosis not present

## 2021-06-26 DIAGNOSIS — Z79899 Other long term (current) drug therapy: Secondary | ICD-10-CM | POA: Diagnosis not present

## 2021-06-26 LAB — COMPREHENSIVE METABOLIC PANEL
ALT: 32 U/L (ref 0–44)
AST: 24 U/L (ref 15–41)
Albumin: 4 g/dL (ref 3.5–5.0)
Alkaline Phosphatase: 55 U/L (ref 38–126)
Anion gap: 9 (ref 5–15)
BUN: 21 mg/dL (ref 8–23)
CO2: 25 mmol/L (ref 22–32)
Calcium: 9.1 mg/dL (ref 8.9–10.3)
Chloride: 103 mmol/L (ref 98–111)
Creatinine, Ser: 0.83 mg/dL (ref 0.61–1.24)
GFR, Estimated: >60 mL/min (ref >60)
Glucose, Bld: 165 mg/dL — ABNORMAL HIGH (ref 70–99)
Potassium: 3.7 mmol/L (ref 3.5–5.1)
Sodium: 137 mmol/L (ref 135–145)
Total Bilirubin: 0.6 mg/dL (ref 0.3–1.2)
Total Protein: 6.8 g/dL (ref 6.5–8.1)

## 2021-06-26 LAB — CBC WITH DIFFERENTIAL/PLATELET
Abs Immature Granulocytes: 0.04 10*3/uL (ref 0.00–0.07)
Basophils Absolute: 0.1 10*3/uL (ref 0.0–0.1)
Basophils Relative: 1 %
Eosinophils Absolute: 0.3 10*3/uL (ref 0.0–0.5)
Eosinophils Relative: 3 %
HCT: 38.6 % — ABNORMAL LOW (ref 39.0–52.0)
Hemoglobin: 13.2 g/dL (ref 13.0–17.0)
Immature Granulocytes: 0 %
Lymphocytes Relative: 18 %
Lymphs Abs: 1.8 10*3/uL (ref 0.7–4.0)
MCH: 35.1 pg — ABNORMAL HIGH (ref 26.0–34.0)
MCHC: 34.2 g/dL (ref 30.0–36.0)
MCV: 102.7 fL — ABNORMAL HIGH (ref 80.0–100.0)
Monocytes Absolute: 0.5 10*3/uL (ref 0.1–1.0)
Monocytes Relative: 5 %
Neutro Abs: 7.6 10*3/uL (ref 1.7–7.7)
Neutrophils Relative %: 73 %
Platelets: 274 10*3/uL (ref 150–400)
RBC: 3.76 MIL/uL — ABNORMAL LOW (ref 4.22–5.81)
RDW: 13.2 % (ref 11.5–15.5)
WBC: 10.3 10*3/uL (ref 4.0–10.5)
nRBC: 0 % (ref 0.0–0.2)

## 2021-06-26 LAB — URINALYSIS, ROUTINE W REFLEX MICROSCOPIC
Bilirubin Urine: NEGATIVE
Glucose, UA: NEGATIVE mg/dL
Hgb urine dipstick: NEGATIVE
Ketones, ur: NEGATIVE mg/dL
Leukocytes,Ua: NEGATIVE
Nitrite: NEGATIVE
Protein, ur: NEGATIVE mg/dL
Specific Gravity, Urine: 1.014 (ref 1.005–1.030)
pH: 5 (ref 5.0–8.0)

## 2021-06-26 LAB — TROPONIN I (HIGH SENSITIVITY): Troponin I (High Sensitivity): 6 ng/L (ref <18)

## 2021-06-26 LAB — MAGNESIUM: Magnesium: 1.3 mg/dL — ABNORMAL LOW (ref 1.7–2.4)

## 2021-06-26 NOTE — Discharge Instructions (Signed)
Your testing here did not show any abnormalities.  We talked to the pacemaker representative who states that your pacemaker has never been used, it has not been required, you have not had any irregular rhythms and no significant extra beats.  We have not seen anything on the monitor and you have had 2 EKGs and blood work which were normal.  You can follow-up with your doctor in the outpatient setting, ER for worsening symptoms ?

## 2021-06-26 NOTE — ED Notes (Signed)
Spoke to [employer] from [facility] post pacemaker interrogation, per report there is evidence of PVCs, arrythmias, or skipped beats. Per Thurmond Butts, patient's pacemaker has been in NSR with no problems. Notified Dr. Sabra Heck of results. ?

## 2021-06-26 NOTE — ED Notes (Signed)
Patient's pacemaker being interrogated using [employer]; data transmitted successfully.  ?

## 2021-06-26 NOTE — ED Triage Notes (Signed)
Here by POV with wife from home for palpitations, also mentions light headedness and general weakness, ongoing for 2-3 weeks, also mentions diarrhea for 2-3 days, but not today. Denies pain, sob, NV, fever, or syncope. ?

## 2021-06-26 NOTE — ED Provider Notes (Signed)
?[place] ?Provider Note ? ? ?CSN: [id number] ?Arrival date & time: 06/26/21  1623 ? ?  ? ?History ? ?Chief Complaint  ?Patient presents with  ? Palpitations  ? ? ?Marcus Norton is a 81 y.o. male. ? ? ?Palpitations ? ?Patient is an 81 year old male presenting with complaint of palpitations, feeling dizzy and lightheaded which comes and goes.  It has been going on for the last year but seems to be worse over the last couple of weeks, it occurred today just before lunch, it is not associated with any chest pain shortness of breath swelling of the legs fevers chills nausea vomiting or diarrhea.  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  He is not having symptoms at this time. ? ?Home Medications ?Prior to Admission medications   ?Medication Sig Start Date End Date Taking? Authorizing Provider  ?albuterol (VENTOLIN HFA) 108 (90 Base) MCG/ACT inhaler Inhale 2 puffs into the lungs every 6 (six) hours as needed for wheezing or shortness of breath.   Yes [provider]  ?alfuzosin (UROXATRAL) 10 MG 24 hr tablet Take 10 mg by mouth daily with breakfast.   Yes [provider]  ?amLODipine (NORVASC) 5 MG tablet Take 1 tablet (5 mg total) by mouth daily. 10/30/19  Yes Wellington Hampshire, MD  ?aspirin EC 81 MG tablet Take 81 mg by mouth at bedtime.    Yes [provider]  ?atorvastatin (LIPITOR) 10 MG tablet Take 1 tablet (10 mg total) by mouth once a week. 09/11/18  Yes Wellington Hampshire, MD  ?Cholecalciferol (VITAMIN D3) 50 MCG (2000 UT) TABS Take 2,000 Units by mouth daily.   Yes [provider]  ?fluorometholone (FML) 0.1 % ophthalmic suspension Place 1 drop into both eyes 2 (two) times daily. 09/02/20  Yes [provider]  ?fluticasone furoate-vilanterol (BREO ELLIPTA) 100-25 MCG/INH AEPB Inhale 1 puff into the lungs daily.  08/28/19  Yes Olalere, Adewale A, MD  ?insulin glargine (LANTUS SOLOSTAR) 100 UNIT/ML Solostar Pen Inject 28 Units into the skin every morning. ?Patient taking differently: Inject 28 Units into the skin daily. 01/07/21  Yes Renato Shin, MD  ?memantine (NAMENDA) 10 MG tablet Take 10 mg by mouth every evening. 04/20/21  Yes [provider]  ?metFORMIN (GLUCOPHAGE-XR) 500 MG 24 hr tablet TAKE 4 TABLETS BY MOUTH EVERY DAY ?Patient taking differently: Take 1,000 mg by mouth in the morning and at bedtime. 02/18/21  Yes Renato Shin, MD  ?metoprolol tartrate (LOPRESSOR) 50 MG tablet TAKE 1 TABLET BY MOUTH TWICE A DAY ?Patient taking differently: Take 50 mg by mouth 2 (two) times daily. 11/04/20  Yes Wellington Hampshire, MD  ?montelukast (SINGULAIR) 10 MG tablet Take 1 tablet (10 mg total) by mouth at bedtime. 04/30/19  Yes Olalere, Adewale A, MD  ?MULTIPLE VITAMIN PO Take 1 tablet by mouth daily.   Yes [provider]  ?Multiple Vitamins-Minerals (HAIR SKIN AND NAILS FORMULA) TABS Take 1 tablet by mouth daily. Gummie   Yes [provider]  ?nitroGLYCERIN (NITROSTAT) 0.4 MG SL tablet Place 1 tablet (0.4 mg total) under the tongue every 5 (five) minutes as needed for chest pain. 07/03/18  Yes [name], PA-C  ?pantoprazole (PROTONIX) 40 MG tablet TAKE 1 TABLET(40 MG) BY MOUTH DAILY ?Patient taking differently: Take 40 mg by mouth daily. 01/22/20  Yes [name], PA-C  ?  REPATHA SURECLICK 196 MG/ML SOAJ ADMINISTER 1 ML UNDER THE SKIN EVERY 14 DAYS ?Patient taking differently: Inject 1 mL into the skin every 14 (fourteen) days. 09/08/20  Yes Wellington Hampshire, MD  ?RYBELSUS 14 MG TABS TAKE 1 TABLET BY MOUTH EVERY DAY ?Patient taking differently: Take 1 tablet by mouth daily. 04/13/21  Yes Renato Shin, MD  ?tadalafil (CIALIS) 20 MG tablet Take 0.5-1 tablets (10-20 mg total) by mouth every other day as needed for erectile dysfunction. ?Patient taking differently: Take 20 mg by mouth daily as  needed for erectile dysfunction. 03/08/17  Yes [name], PA-C  ?vitamin C (ASCORBIC ACID) 250 MG tablet Take 500 mg by mouth daily.   Yes [provider]  ?ACCU-CHEK AVIVA PLUS test strip USE AS DIRECTED 06/08/21   Renato Shin, MD  ?Continuous Blood Gluc Sensor (FREESTYLE LIBRE 2 SENSOR) MISC 1 Device by Does not apply route every 14 (fourteen) days. 01/07/21   Renato Shin, MD  ?Insulin Pen Needle (PEN NEEDLES) 32G X 4 MM MISC To use daily with Lantus solostar. Dx E11.9 12/20/19   [name], PA-C  ?Suvorexant 10 MG TABS Take 10 mg by mouth at bedtime. ?Patient not taking: Reported on 06/26/2021 01/30/20   [name], PA-C  ?   ? ?Allergies    ?Plavix [clopidogrel bisulfate], Oxycontin [oxycodone hcl], Penicillins, Pioglitazone, Prednisone, and Simvastatin   ? ?Review of Systems   ?Review of Systems  ?Cardiovascular:  Positive for palpitations.  ?All other systems reviewed and are negative. ? ?Physical Exam ?Updated Vital Signs ?BP (!) 126/57   Pulse 77   Resp 16   Ht 1.702 m ('5\' 7"'$ )   Wt 78 kg   SpO2 93%   BMI 26.94 kg/m?  ?Physical Exam ?Vitals and nursing note reviewed.  ?Constitutional:   ?   General: He is not in acute distress. ?   Appearance: He is well-developed.  ?HENT:  ?   Head: Normocephalic and atraumatic.  ?   Mouth/Throat:  ?   Pharynx: No oropharyngeal exudate.  ?Eyes:  ?   General: No scleral icterus.    ?   Right eye: No discharge.     ?   Left eye: No discharge.  ?   Conjunctiva/sclera: Conjunctivae normal.  ?   Pupils: Pupils are equal, round, and reactive to light.  ?Neck:  ?   Thyroid: No thyromegaly.  ?   Vascular: No JVD.  ?Cardiovascular:  ?   Rate and Rhythm: Normal rate and regular rhythm.  ?   Heart sounds: Murmur heard.  ?  No friction rub. No gallop.  ?   Comments: Soft systolic murmur ?Pulmonary:  ?   Effort: Pulmonary effort is normal. No respiratory distress.  ?   Breath sounds: Normal breath sounds. No wheezing or rales.  ?Abdominal:  ?    General: Bowel sounds are normal. There is no distension.  ?   Palpations: Abdomen is soft. There is no mass.  ?   Tenderness: There is no abdominal tenderness.  ?Musculoskeletal:     ?   General: No tenderness. Normal range of motion.  ?   Cervical back: Normal range of motion and neck supple.  ?Lymphadenopathy:  ?   Cervical: No cervical adenopathy.  ?Skin: ?   General: Skin is warm and dry.  ?   Findings: No erythema or rash.  ?Neurological:  ?   Mental Status: He is alert.  ?   Coordination: Coordination normal.  ?  Psychiatric:     ?   Behavior: Behavior normal.  ? ? ?ED Results / Procedures / Treatments   ?Labs ?(all labs ordered are listed, but only abnormal results are displayed) ?Labs Reviewed  ?COMPREHENSIVE METABOLIC PANEL - Abnormal; Notable for the following components:  ?    Result Value  ? Glucose, Bld 165 (*)   ? All other components within normal limits  ?CBC WITH DIFFERENTIAL/PLATELET - Abnormal; Notable for the following components:  ? RBC 3.76 (*)   ? HCT 38.6 (*)   ? MCV 102.7 (*)   ? MCH 35.1 (*)   ? All other components within normal limits  ?MAGNESIUM - Abnormal; Notable for the following components:  ? Magnesium 1.3 (*)   ? All other components within normal limits  ?URINALYSIS, ROUTINE W REFLEX MICROSCOPIC  ?TROPONIN I (HIGH SENSITIVITY)  ? ? ?EKG ?EKG Interpretation ? ?Date/Time:  Friday Jun 26 2021 17:07:01 EDT ?Ventricular Rate:  91 ?PR Interval:  158 ?QRS Duration: 100 ?QT Interval:  372 ?QTC Calculation: 457 ?R Axis:   -71 ?Text Interpretation: Normal sinus rhythm Incomplete right bundle branch block Left anterior fascicular block Abnormal ECG When compared with ECG of 30-Sep-2020 02:47, PREVIOUS ECG IS PRESENT compared to 8/22, no changes seen Confirmed by Noemi Chapel [phone]) on 06/26/2021 5:11:02 PM ? ? EKG Interpretation ? ?Date/Time:  Friday Jun 26 2021 19:43:43 EDT ?Ventricular Rate:  91 ?PR Interval:  158 ?QRS Duration: 114 ?QT Interval:  389 ?QTC Calculation: 479 ?R  Axis:   -53 ?Text Interpretation: Normal sinus rhythm Incomplete RBBB and LAFB Probable anteroseptal infarct, old Baseline wander in lead(s) V4 since last tracing no significant change Confirmed by Noemi Chapel [phone]) on 06/26/2021 7

## 2021-07-06 DIAGNOSIS — I10 Essential (primary) hypertension: Secondary | ICD-10-CM | POA: Diagnosis not present

## 2021-07-06 DIAGNOSIS — E782 Mixed hyperlipidemia: Secondary | ICD-10-CM | POA: Diagnosis not present

## 2021-07-06 DIAGNOSIS — R7309 Other abnormal glucose: Secondary | ICD-10-CM | POA: Diagnosis not present

## 2021-07-06 DIAGNOSIS — R42 Dizziness and giddiness: Secondary | ICD-10-CM | POA: Diagnosis not present

## 2021-07-06 DIAGNOSIS — R002 Palpitations: Secondary | ICD-10-CM | POA: Diagnosis not present

## 2021-07-06 DIAGNOSIS — I25118 Atherosclerotic heart disease of native coronary artery with other forms of angina pectoris: Secondary | ICD-10-CM | POA: Diagnosis not present

## 2021-07-06 DIAGNOSIS — Z09 Encounter for follow-up examination after completed treatment for conditions other than malignant neoplasm: Secondary | ICD-10-CM | POA: Diagnosis not present

## 2021-07-06 DIAGNOSIS — Z950 Presence of cardiac pacemaker: Secondary | ICD-10-CM | POA: Diagnosis not present

## 2021-07-06 DIAGNOSIS — I471 Supraventricular tachycardia: Secondary | ICD-10-CM | POA: Diagnosis not present

## 2021-07-06 DIAGNOSIS — I739 Peripheral vascular disease, unspecified: Secondary | ICD-10-CM | POA: Diagnosis not present

## 2021-07-07 DIAGNOSIS — R0602 Shortness of breath: Secondary | ICD-10-CM | POA: Diagnosis not present

## 2021-07-07 DIAGNOSIS — R42 Dizziness and giddiness: Secondary | ICD-10-CM | POA: Diagnosis not present

## 2021-07-07 DIAGNOSIS — R002 Palpitations: Secondary | ICD-10-CM | POA: Diagnosis not present

## 2021-07-07 DIAGNOSIS — I2510 Atherosclerotic heart disease of native coronary artery without angina pectoris: Secondary | ICD-10-CM | POA: Diagnosis not present

## 2021-07-08 ENCOUNTER — Other Ambulatory Visit: Payer: Self-pay | Admitting: Internal Medicine

## 2021-07-08 ENCOUNTER — Other Ambulatory Visit: Payer: Self-pay | Admitting: Endocrinology

## 2021-07-08 DIAGNOSIS — E119 Type 2 diabetes mellitus without complications: Secondary | ICD-10-CM

## 2021-07-09 IMAGING — DX DG CHEST 1V PORT
1 series · 1 of 1 positions shown · non-contrast
Comparison: Chest x-ray 03/13/2018.

CLINICAL DATA: 79-year-old male with history of pacemaker
placement.

EXAM:
PORTABLE CHEST 1 VIEW

[chest]
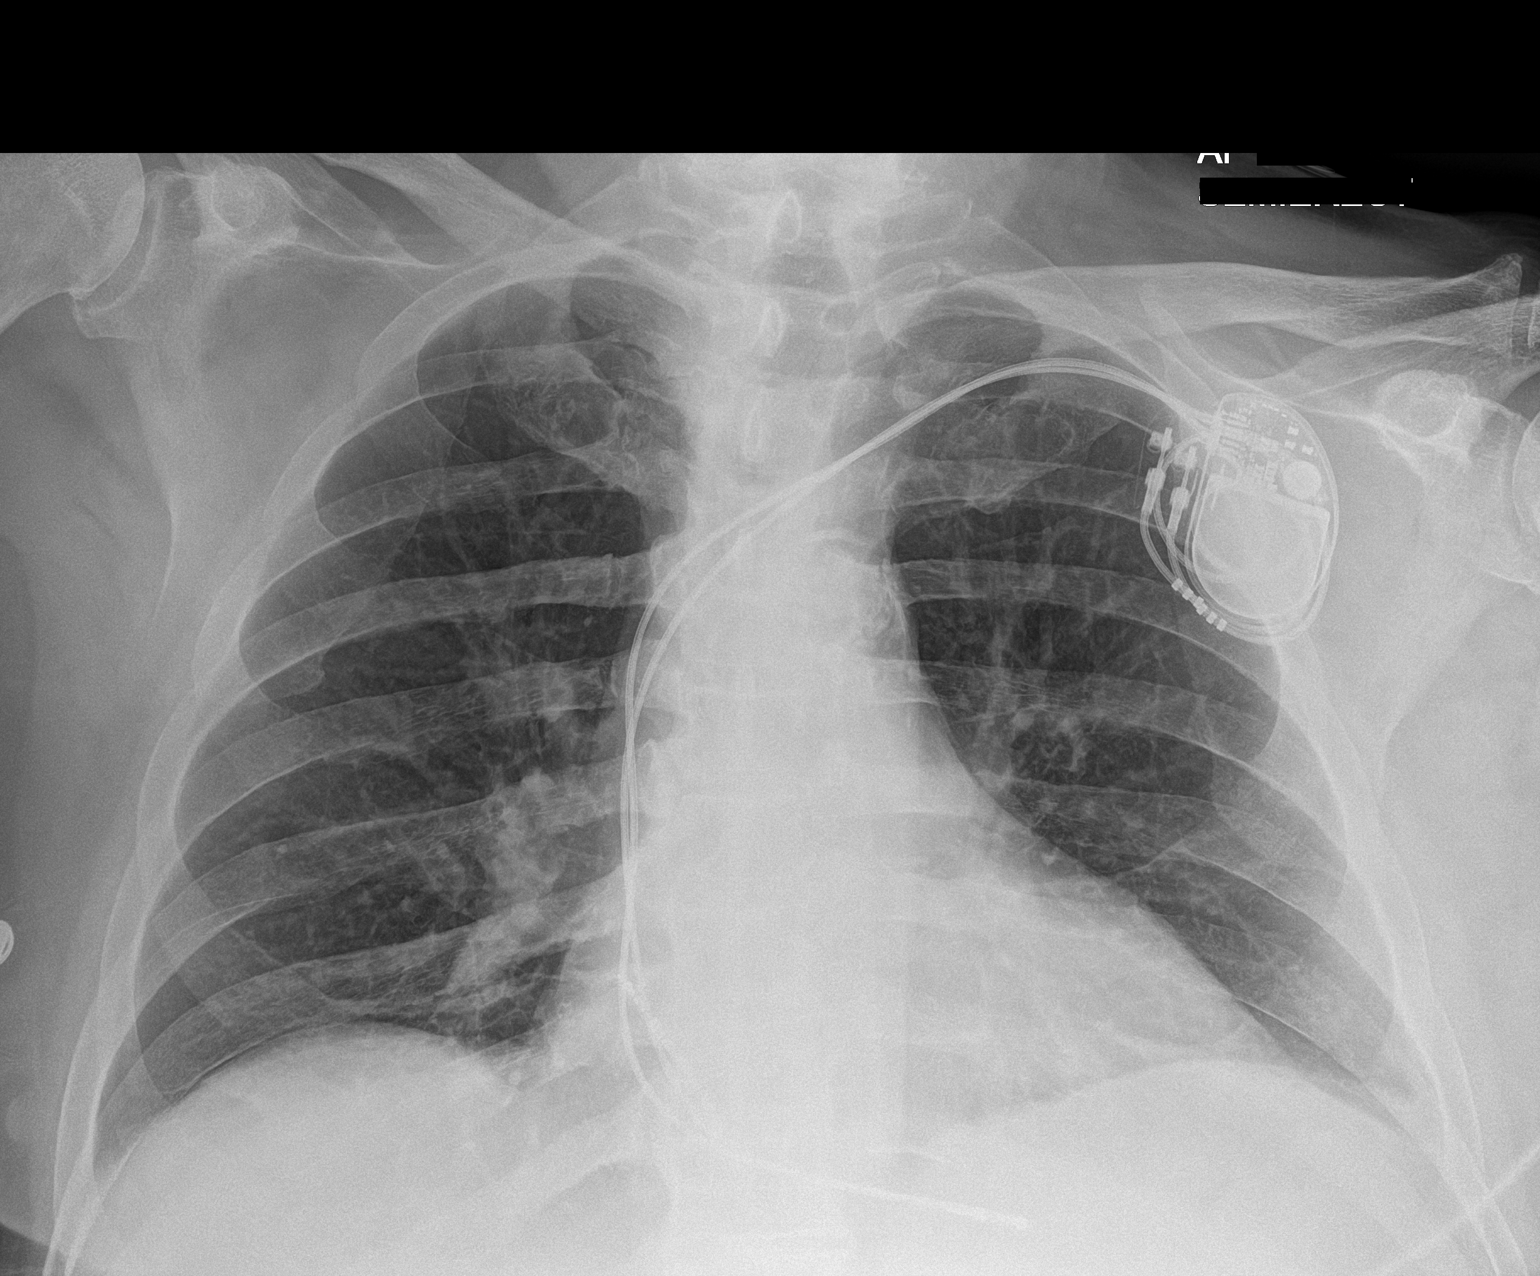

[1 of 1 positions shown; findings below may reference images not displayed]

FINDINGS: New left-sided pacemaker device in place with lead tips projecting
over the expected location of the right atrium and right ventricle.
Lung volumes are low. No consolidative airspace disease. No pleural
effusions. No pneumothorax. No pulmonary nodule or mass noted.
Pulmonary vasculature and the cardiomediastinal silhouette are
within normal limits.
IMPRESSION: 1. New left-sided pacemaker device in place. No pneumothorax or
other acute complicating features.

## 2021-07-14 ENCOUNTER — Ambulatory Visit (INDEPENDENT_AMBULATORY_CARE_PROVIDER_SITE_OTHER): Payer: Medicare PPO

## 2021-07-14 DIAGNOSIS — I495 Sick sinus syndrome: Secondary | ICD-10-CM

## 2021-07-15 LAB — CUP PACEART REMOTE DEVICE CHECK
Battery Remaining Longevity: 107 mo
Battery Remaining Percentage: 89 %
Battery Voltage: 3.01 V
Brady Statistic AP VP Percent: 1 %
Brady Statistic AP VS Percent: 5.3 %
Brady Statistic AS VP Percent: 1 %
Brady Statistic AS VS Percent: 94 %
Brady Statistic RA Percent Paced: 5 %
Brady Statistic RV Percent Paced: 1 %
Date Time Interrogation Session: 20230524001551
Implantable Lead Implant Date: 20210823
Implantable Lead Implant Date: 20210823
Implantable Lead Location: 753859
Implantable Lead Location: 753860
Implantable Pulse Generator Implant Date: 20210823
Lead Channel Impedance Value: 360 Ohm
Lead Channel Impedance Value: 580 Ohm
Lead Channel Pacing Threshold Amplitude: 0.5 V
Lead Channel Pacing Threshold Amplitude: 0.75 V
Lead Channel Pacing Threshold Pulse Width: 0.5 ms
Lead Channel Pacing Threshold Pulse Width: 0.5 ms
Lead Channel Sensing Intrinsic Amplitude: 10.3 mV
Lead Channel Sensing Intrinsic Amplitude: 2.9 mV
Lead Channel Setting Pacing Amplitude: 2 V
Lead Channel Setting Pacing Amplitude: 2 V
Lead Channel Setting Pacing Pulse Width: 0.5 ms
Lead Channel Setting Sensing Sensitivity: 2 mV
Pulse Gen Model: 2272
Pulse Gen Serial Number: 3859349

## 2021-07-24 ENCOUNTER — Other Ambulatory Visit (HOSPITAL_COMMUNITY): Payer: Self-pay | Admitting: Cardiovascular Disease

## 2021-07-24 DIAGNOSIS — Z130 Encounter for screening for diseases of the blood and blood-forming organs and certain disorders involving the immune mechanism: Secondary | ICD-10-CM | POA: Diagnosis not present

## 2021-07-24 DIAGNOSIS — Z95828 Presence of other vascular implants and grafts: Secondary | ICD-10-CM

## 2021-07-24 DIAGNOSIS — Z0000 Encounter for general adult medical examination without abnormal findings: Secondary | ICD-10-CM | POA: Diagnosis not present

## 2021-07-24 DIAGNOSIS — I10 Essential (primary) hypertension: Secondary | ICD-10-CM | POA: Diagnosis not present

## 2021-07-24 DIAGNOSIS — E782 Mixed hyperlipidemia: Secondary | ICD-10-CM | POA: Diagnosis not present

## 2021-07-28 NOTE — Progress Notes (Signed)
Remote pacemaker transmission.   

## 2021-08-05 ENCOUNTER — Ambulatory Visit (HOSPITAL_COMMUNITY)
Admission: RE | Admit: 2021-08-05 | Discharge: 2021-08-05 | Disposition: A | Discharge status: Home / Self Care | Payer: Medicare PPO | Source: Ambulatory Visit | Attending: Cardiovascular Disease | Admitting: Cardiovascular Disease

## 2021-08-05 ENCOUNTER — Ambulatory Visit (HOSPITAL_BASED_OUTPATIENT_CLINIC_OR_DEPARTMENT_OTHER)
Admission: RE | Admit: 2021-08-05 | Discharge: 2021-08-05 | Disposition: A | Discharge status: Home / Self Care | Payer: Medicare PPO | Source: Ambulatory Visit | Attending: Cardiovascular Disease | Admitting: Cardiovascular Disease

## 2021-08-05 DIAGNOSIS — Z95828 Presence of other vascular implants and grafts: Secondary | ICD-10-CM

## 2021-08-07 ENCOUNTER — Encounter: Payer: Self-pay | Admitting: *Deleted

## 2021-08-19 ENCOUNTER — Other Ambulatory Visit: Payer: Self-pay | Admitting: *Deleted

## 2021-08-19 DIAGNOSIS — I739 Peripheral vascular disease, unspecified: Secondary | ICD-10-CM

## 2021-09-01 ENCOUNTER — Other Ambulatory Visit: Payer: Self-pay | Admitting: Cardiovascular Disease

## 2021-09-01 DIAGNOSIS — E785 Hyperlipidemia, unspecified: Secondary | ICD-10-CM

## 2021-09-01 DIAGNOSIS — I2510 Atherosclerotic heart disease of native coronary artery without angina pectoris: Secondary | ICD-10-CM

## 2021-09-01 NOTE — Telephone Encounter (Signed)
Refill request

## 2021-10-01 ENCOUNTER — Other Ambulatory Visit: Payer: Self-pay | Admitting: *Deleted

## 2021-10-01 NOTE — Patient Outreach (Signed)
  Care Coordination   10/01/2021 Name: Marcus Norton MRN: [id number] DOB: 29-Nov-1940   Care Coordination Outreach Attempts:  An unsuccessful telephone outreach was attempted today to offer the patient information about available care coordination services as a benefit of their health plan.   Follow Up Plan:  Additional outreach attempts will be made to offer the patient care coordination information and services.   Encounter Outcome:  No Answer  Care Coordination Interventions Activated:  No   Care Coordination Interventions:  No, not indicated    Raina Mina, RN Care Management Coordinator [place] Office [phone]

## 2021-10-08 ENCOUNTER — Ambulatory Visit (HOSPITAL_COMMUNITY)
Admission: RE | Admit: 2021-10-08 | Discharge: 2021-10-08 | Disposition: A | Discharge status: Home / Self Care | Payer: Medicare PPO | Source: Ambulatory Visit | Attending: Cardiology | Admitting: Cardiology

## 2021-10-08 DIAGNOSIS — I6523 Occlusion and stenosis of bilateral carotid arteries: Secondary | ICD-10-CM | POA: Diagnosis not present

## 2021-10-08 DIAGNOSIS — I6529 Occlusion and stenosis of unspecified carotid artery: Secondary | ICD-10-CM | POA: Insufficient documentation

## 2021-10-09 ENCOUNTER — Ambulatory Visit (HOSPITAL_COMMUNITY): Payer: Medicare PPO | Attending: Cardiology

## 2021-10-09 DIAGNOSIS — I350 Nonrheumatic aortic (valve) stenosis: Secondary | ICD-10-CM | POA: Diagnosis present

## 2021-10-10 LAB — ECHOCARDIOGRAM COMPLETE
AR max vel: 1.25 cm2
AV Area VTI: 1.41 cm2
AV Area mean vel: 1.25 cm2
AV Mean grad: 13.5 mmHg
AV Peak grad: 27 mmHg
Ao pk vel: 2.6 m/s
Area-P 1/2: 2.87 cm2
S' Lateral: 2.2 cm

## 2021-10-12 ENCOUNTER — Other Ambulatory Visit: Payer: Self-pay | Admitting: *Deleted

## 2021-10-12 NOTE — Patient Outreach (Signed)
  Care Coordination   10/12/2021 Name: Marcus Norton MRN: [id number] DOB: 11-Jul-1940   Care Coordination Outreach Attempts:  A second unsuccessful outreach was attempted today to offer the patient with information about available care coordination services as a benefit of their health plan.     Follow Up Plan:  Additional outreach attempts will be made to offer the patient care coordination information and services.   Encounter Outcome:  No Answer  Care Coordination Interventions Activated:  No   Care Coordination Interventions:  No, not indicated    Raina Mina, RN Care Management Coordinator [place] Office [phone]

## 2021-10-13 ENCOUNTER — Ambulatory Visit (INDEPENDENT_AMBULATORY_CARE_PROVIDER_SITE_OTHER): Payer: Medicare PPO

## 2021-10-13 ENCOUNTER — Other Ambulatory Visit: Payer: Self-pay | Admitting: Internal Medicine

## 2021-10-13 DIAGNOSIS — I495 Sick sinus syndrome: Secondary | ICD-10-CM | POA: Diagnosis not present

## 2021-10-13 DIAGNOSIS — E119 Type 2 diabetes mellitus without complications: Secondary | ICD-10-CM

## 2021-10-13 LAB — CUP PACEART REMOTE DEVICE CHECK
Battery Remaining Longevity: 106 mo
Battery Remaining Percentage: 87 %
Battery Voltage: 3.01 V
Brady Statistic AP VP Percent: 1 %
Brady Statistic AP VS Percent: 5.2 %
Brady Statistic AS VP Percent: 1 %
Brady Statistic AS VS Percent: 95 %
Brady Statistic RA Percent Paced: 5 %
Brady Statistic RV Percent Paced: 1 %
Date Time Interrogation Session: 20230822050301
Implantable Lead Implant Date: 20210823
Implantable Lead Implant Date: 20210823
Implantable Lead Location: 753859
Implantable Lead Location: 753860
Implantable Pulse Generator Implant Date: 20210823
Lead Channel Impedance Value: 380 Ohm
Lead Channel Impedance Value: 610 Ohm
Lead Channel Pacing Threshold Amplitude: 0.5 V
Lead Channel Pacing Threshold Amplitude: 0.75 V
Lead Channel Pacing Threshold Pulse Width: 0.5 ms
Lead Channel Pacing Threshold Pulse Width: 0.5 ms
Lead Channel Sensing Intrinsic Amplitude: 11.7 mV
Lead Channel Sensing Intrinsic Amplitude: 2.7 mV
Lead Channel Setting Pacing Amplitude: 2 V
Lead Channel Setting Pacing Amplitude: 2 V
Lead Channel Setting Pacing Pulse Width: 0.5 ms
Lead Channel Setting Sensing Sensitivity: 2 mV
Pulse Gen Model: 2272
Pulse Gen Serial Number: 3859349

## 2021-10-15 ENCOUNTER — Other Ambulatory Visit: Payer: Self-pay | Admitting: *Deleted

## 2021-10-15 DIAGNOSIS — I6529 Occlusion and stenosis of unspecified carotid artery: Secondary | ICD-10-CM

## 2021-11-10 NOTE — Progress Notes (Signed)
Remote pacemaker transmission.   

## 2021-11-12 ENCOUNTER — Other Ambulatory Visit: Payer: Self-pay | Admitting: Cardiovascular Disease

## 2021-11-12 NOTE — Telephone Encounter (Signed)
Refill request

## 2021-11-13 ENCOUNTER — Other Ambulatory Visit: Payer: Self-pay | Admitting: Internal Medicine

## 2021-11-13 DIAGNOSIS — E119 Type 2 diabetes mellitus without complications: Secondary | ICD-10-CM

## 2021-11-16 ENCOUNTER — Other Ambulatory Visit: Payer: Self-pay | Admitting: Internal Medicine

## 2021-11-16 DIAGNOSIS — E119 Type 2 diabetes mellitus without complications: Secondary | ICD-10-CM

## 2021-11-17 ENCOUNTER — Encounter: Payer: Self-pay | Admitting: Cardiovascular Disease

## 2021-11-17 ENCOUNTER — Ambulatory Visit: Payer: Medicare PPO | Attending: Cardiovascular Disease | Admitting: Cardiovascular Disease

## 2021-11-17 VITALS — BP 140/60 | HR 78 | Ht 67.0 in | Wt 172.8 lb

## 2021-11-17 DIAGNOSIS — E785 Hyperlipidemia, unspecified: Secondary | ICD-10-CM | POA: Diagnosis not present

## 2021-11-17 DIAGNOSIS — I739 Peripheral vascular disease, unspecified: Secondary | ICD-10-CM | POA: Diagnosis not present

## 2021-11-17 DIAGNOSIS — I2510 Atherosclerotic heart disease of native coronary artery without angina pectoris: Secondary | ICD-10-CM | POA: Diagnosis not present

## 2021-11-17 DIAGNOSIS — I359 Nonrheumatic aortic valve disorder, unspecified: Secondary | ICD-10-CM

## 2021-11-17 DIAGNOSIS — I10 Essential (primary) hypertension: Secondary | ICD-10-CM | POA: Diagnosis not present

## 2021-11-17 NOTE — Progress Notes (Signed)
Cardiology Office Note   Date:  11/17/2021   ID:  Donato, Studley 04-Oct-1940, MRN [id number]  PCP:  Tomasa Hose, NP  Cardiologist: Dr. Fletcher Anon  No chief complaint on file.      History of Present Illness: Marcus Norton is a 81 y.o. male who presents for a follow-up visit regarding peripheral arterial disease, PSVT, carotid disease and aortic stenosis.   He has known history of  paroxysmal supraventricular tachycardia,  coronary artery disease, aortic stenosis, type 2 diabetes since 1990, hypertension and hyperlipidemia. He has known history of claudication . He did not tolerate cilostazol due to palpitations. He is s/p bilateral common iliac artery kissing stent placement in 11/2015. 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   He had an abnormal nuclear stress test in 2018.  Cardiac catheterization in June, 2018 showed severe ostial RCA stenosis and occluded RPL2 with nonobstructive disease affecting the left system.  He underwent successful angioplasty and drug-eluting stent placement to the ostial right coronary artery. He has known history of intolerance to statins and was able to tolerate only small dose of atorvastatin and Repatha.   He is status post pacemaker placement for tachybradycardia syndrome.    Most recent Doppler studies in June showed an ABI of 0.67 on the right and 1.09 on the left.  Duplex showed patent iliac stents with no significant restenosis.  Most recent echocardiogram in August showed normal LV systolic function with mild to moderate aortic stenosis with mean gradient of 14 mmHg and valve area of 1.4 cm grade.  He has been doing well with no recent chest pain, shortness of breath or dizziness.  He reports occasional skipping in his heart that makes him nervous but this usually last for few seconds.  He has mild  right calf claudication.  No symptoms on the left side.  Past Medical History:  Diagnosis Date   Adenomatous colon polyp 2000/2010   Arthritis    "lower back" (07/29/2016)   ASCVD (arteriosclerotic cardiovascular disease)    -luminal irregularities in 1998 and 2005; normal EF   Benign prostatic hypertrophy    status post transurethral resection of the prostate   CAD (coronary artery disease)    a. Cath 07/29/16 99% ostial RCA & 70% pRCA s/p cutting balloon angioplasty and single SYNERGY DES 2.5X28. 100% very Distal 1st RPLB lesion --> the occlusion site moved further distal with guidewire advancement;  40% ostial LAD; 50% Ost Cx to Prox Cx lesion.   Coronary artery disease    Dysphagia    Erectile dysfunction    GERD (gastroesophageal reflux disease)    Heart murmur    History of hiatal hernia    History of kidney stones    Hyperlipidemia    Hypertension    Seasonal allergies    Tobacco abuse    20 pack years; discontinued 1995   Type II diabetes mellitus ([place])     Past Surgical History:  Procedure Laterality Date   APPENDECTOMY     CATARACT EXTRACTION W/ INTRAOCULAR LENS  IMPLANT, BILATERAL Bilateral    COLONOSCOPY  07/20/2011   Dr. Gala Romney: multiple hyperplastic polyps. Surveillance 2018   COLONOSCOPY N/A 06/30/2016   Procedure: COLONOSCOPY;  Surgeon: Daneil Dolin, MD;  [facility];  Service: Endoscopy;  Laterality: N/A;  10:00am   COLONOSCOPY W/ BIOPSIES AND  POLYPECTOMY  07/08/08, 06/2011   Dr Madolyn Frieze papilla, diminutive rectal polyp ablated the, left-sided diverticula, piecemeal polypectomy of hepatic flexure adenomatous polyp, diminutive polyps near hepatic flexure ablated   CORONARY ANGIOPLASTY WITH STENT PLACEMENT  07/29/2016   CORONARY STENT INTERVENTION N/A 07/29/2016   Procedure: Coronary Stent Intervention;  Surgeon: Leonie Man, MD;  Location: [facility];  Service: Cardiovascular;  Laterality: N/A;  RCA   ESOPHAGOGASTRODUODENOSCOPY  2013    erosive reflux esophagitis, s/p empiric dilation. chronic duodenitis.    LEFT HEART CATH AND CORONARY ANGIOGRAPHY N/A 07/29/2016   Procedure: Left Heart Cath and Coronary Angiography;  Surgeon: Leonie Man, MD;  Location: [facility];  Service: Cardiovascular;  Laterality: N/A;   NASAL SEPTUM SURGERY     NASAL SINUS SURGERY     "cleaned out my sinus"   PACEMAKER IMPLANT N/A 10/15/2019   Procedure: PACEMAKER IMPLANT;  Surgeon: Sanda Klein, MD;  Location: [place] [facility];  Service: Cardiovascular;  Laterality: N/A;   PERIPHERAL VASCULAR CATHETERIZATION N/A 11/26/2015   Procedure: Abdominal Aortogram w/Lower Extremity;  Surgeon: Wellington Hampshire, MD;  Location: [facility];  Service: Cardiovascular;  Laterality: N/A;   TONSILLECTOMY     TRANSURETHRAL RESECTION OF PROSTATE  2009     Current Outpatient Medications  Medication Sig Dispense Refill   albuterol (VENTOLIN HFA) 108 (90 Base) MCG/ACT inhaler Inhale 2 puffs into the lungs every 6 (six) hours as needed for wheezing or shortness of breath.     amLODipine (NORVASC) 5 MG tablet Take 1 tablet (5 mg total) by mouth daily. 90 tablet 1   aspirin EC 81 MG tablet Take 81 mg by mouth at bedtime.      atorvastatin (LIPITOR) 10 MG tablet Take 1 tablet (10 mg total) by mouth once a week. 90 tablet 1   Cholecalciferol (VITAMIN D3) 50 MCG (2000 UT) TABS Take 2,000 Units by mouth daily.     Continuous Blood Gluc Sensor (FREESTYLE LIBRE 2 SENSOR) MISC 1 Device by Does not apply route every 14 (fourteen) days. 6 each 3   Evolocumab (REPATHA SURECLICK) 237 MG/ML SOAJ Inject 1 mL into the skin every 14 (fourteen) days. 6 mL 3   fluorometholone (FML) 0.1 % ophthalmic suspension Place 1 drop into both eyes 2 (two) times daily.     glucose blood (ACCU-CHEK AVIVA PLUS) test strip USE AS DIRECTED TO CHECK BLOOD SUGAR 2X DAILY 100 strip 1   insulin glargine (LANTUS SOLOSTAR) 100 UNIT/ML Solostar Pen Inject 28 Units into the skin every  morning. (Patient taking differently: Inject 28 Units into the skin daily.) 30 mL 3   Insulin Pen Needle (PEN NEEDLES) 32G X 4 MM MISC To use daily with Lantus solostar. Dx E11.9 100 each 1   memantine (NAMENDA) 10 MG tablet Take 10 mg by mouth every evening.     metFORMIN (GLUCOPHAGE-XR) 500 MG 24 hr tablet TAKE 4 TABLETS BY MOUTH EVERY DAY (Patient taking differently: Take 1,000 mg by mouth in the morning and at bedtime.) 360 tablet 1   metoprolol tartrate (LOPRESSOR) 50 MG tablet TAKE 1 TABLET BY MOUTH TWICE A DAY 180 tablet 2   nitroGLYCERIN (NITROSTAT) 0.4 MG SL tablet Place 1 tablet (0.4 mg total) under the tongue every 5 (five) minutes as needed for chest pain. 5 tablet 0   pantoprazole (PROTONIX) 40 MG tablet TAKE 1 TABLET(40 MG) BY MOUTH DAILY (Patient taking differently: Take 40 mg by mouth daily.) 90 tablet 1  RYBELSUS 14 MG TABS TAKE 1 TABLET BY MOUTH EVERY DAY (Patient taking differently: Take 1 tablet by mouth daily.) 90 tablet 1   Suvorexant 10 MG TABS Take 10 mg by mouth at bedtime. 30 tablet 0   tadalafil (CIALIS) 20 MG tablet Take 0.5-1 tablets (10-20 mg total) by mouth every other day as needed for erectile dysfunction. (Patient taking differently: Take 20 mg by mouth daily as needed for erectile dysfunction.) 5 tablet 1   vitamin C (ASCORBIC ACID) 250 MG tablet Take 500 mg by mouth daily.     No current facility-administered medications for this visit.    Allergies:   Plavix [clopidogrel bisulfate], Oxycontin [oxycodone hcl], Penicillins, Pioglitazone, Prednisone, and Simvastatin    Social History:  The patient  reports that he quit smoking about 28 years ago. His smoking use included cigarettes. He started smoking about 56 years ago. He has a 15.00 pack-year smoking history. He has never used smokeless tobacco. He reports that he does not drink alcohol and does not use drugs.   Family History:  The patient's family history includes Colon cancer (age of onset: 29) in his  sister; Diabetes in his brother and maternal grandmother; Ovarian cancer in his mother.    ROS:  Please see the history of present illness.   Otherwise, review of systems are positive for none.   All other systems are reviewed and negative.    PHYSICAL EXAM: VS:  BP (!) 140/60   Pulse 78   Ht '5\' 7"'$  (1.702 m)   Wt 172 lb 12.8 oz (78.4 kg)   SpO2 96%   BMI 27.06 kg/m  , BMI Body mass index is 27.06 kg/m. GEN: Well nourished, well developed, in no acute distress  HEENT: normal  Neck: no JVD, carotid bruits, or masses Cardiac: RRR; no  rubs, or gallops,no edema . 2/6 crescendo decrescendo systolic murmur in the aortic area which is mid peaking Respiratory:  clear to auscultation bilaterally, normal work of breathing GI: soft, nontender, nondistended, + BS MS: no deformity or atrophy  Skin: warm and dry, no rash Neuro:  Strength and sensation are intact Psych: euthymic mood, full affect Vascular: Femoral pulse: Normal bilaterally.  Distal pulses are not palpable     Recent Labs: 06/26/2021: ALT 32; BUN 21; Creatinine, Ser 0.83; Hemoglobin 13.2; Magnesium 1.3; Platelets 274; Potassium 3.7; Sodium 137    Lipid Panel    Component Value Date/Time   CHOL 57 12/20/2019 0903   CHOL 75 (L) 02/06/2019 1052   TRIG 50.0 12/20/2019 0903   TRIG 96 03/12/2008 0000   HDL 34.50 (L) 12/20/2019 0903   HDL 37 (L) 02/06/2019 1052   CHOLHDL 2 12/20/2019 0903   VLDL 10.0 12/20/2019 0903   LDLCALC 12 12/20/2019 0903   LDLCALC 24 02/06/2019 1052   LDLCALC 91 03/12/2008 0000      Wt Readings from Last 3 Encounters:  11/17/21 172 lb 12.8 oz (78.4 kg)  06/26/21 172 lb (78 kg)  03/31/21 176 lb 9.6 oz (80.1 kg)           No data to display            ASSESSMENT AND PLAN:  1.  Peripheral arterial disease: He has mild right calf claudication which is expected given his known occluded right SFA.  His symptoms are not lifestyle limiting.  Most recent Doppler studies showed patent iliac  stents.   2. Coronary artery disease involving native coronary arteries without angina: Continue aggressive medical  therapy.    3. Hyperlipidemia: He is currently on atorvastatin and Repatha with most recent LDL of 12.  If LDL remains that low, atorvastatin can be discontinued.  4. Essential hypertension: Blood pressure is well controlled on current medications.  5.  Moderate aortic stenosis: He is asymptomatic.  Plan repeat echocardiogram in 1 to 2 years.  6.  Tachycardia-bradycardia syndrome, status post dual-chamber pacemaker placement. Continue to follow-up with Dr. Loletha Grayer.  Recent brief palpitations suggestive of premature beats.  He was reassured.  If symptoms become persistent, outpatient monitoring can be considered.  7.  Moderate bilateral carotid disease: this was noted on CT scan.  However, most recent carotid Doppler showed mild stenosis overall.   Disposition:   FU with me in 6 months  Signed,  [name], MD  11/17/2021 10:08 AM    [place]

## 2021-11-17 NOTE — Patient Instructions (Signed)
Medication Instructions:  Your physician recommends that you continue on your current medications as directed. Please refer to the Current Medication list given to you today.  *If you need a refill on your cardiac medications before your next appointment, please call your pharmacy*   Lab Work: None   Testing/Procedures: None   Follow-Up: At [facility], you and your health needs are our priority.  As part of our continuing mission to provide you with exceptional heart care, we have created designated Provider Care Teams.  These Care Teams include your primary Cardiologist (physician) and Advanced Practice Providers (APPs -  Physician Assistants and Nurse Practitioners) who all work together to provide you with the care you need, when you need it.  We recommend signing up for the patient portal called "MyChart".  Sign up information is provided on this After Visit Summary.  MyChart is used to connect with patients for Virtual Visits (Telemedicine).  Patients are able to view lab/test results, encounter notes, upcoming appointments, etc.  Non-urgent messages can be sent to your provider as well.   To learn more about what you can do with MyChart, go to [email or web address].    Your next appointment:   6 month(s)  The format for your next appointment:   In Person  Provider:   Muhammad Arida, MD     Other Instructions   Important Information About Sugar       

## 2021-12-14 ENCOUNTER — Encounter (INDEPENDENT_AMBULATORY_CARE_PROVIDER_SITE_OTHER): Payer: Medicare PPO | Admitting: Ophthalmology

## 2022-01-12 ENCOUNTER — Ambulatory Visit (INDEPENDENT_AMBULATORY_CARE_PROVIDER_SITE_OTHER): Payer: Medicare PPO

## 2022-01-12 DIAGNOSIS — I495 Sick sinus syndrome: Secondary | ICD-10-CM

## 2022-01-15 LAB — CUP PACEART REMOTE DEVICE CHECK
Battery Remaining Longevity: 104 mo
Battery Remaining Percentage: 85 %
Battery Voltage: 3.01 V
Brady Statistic AP VP Percent: 1 %
Brady Statistic AP VS Percent: 5 %
Brady Statistic AS VP Percent: 1 %
Brady Statistic AS VS Percent: 95 %
Brady Statistic RA Percent Paced: 4.8 %
Brady Statistic RV Percent Paced: 1 %
Date Time Interrogation Session: 20231123015037
Implantable Lead Connection Status: 753985
Implantable Lead Connection Status: 753985
Implantable Lead Implant Date: 20210823
Implantable Lead Implant Date: 20210823
Implantable Lead Location: 753859
Implantable Lead Location: 753860
Implantable Pulse Generator Implant Date: 20210823
Lead Channel Impedance Value: 400 Ohm
Lead Channel Impedance Value: 660 Ohm
Lead Channel Pacing Threshold Amplitude: 0.5 V
Lead Channel Pacing Threshold Amplitude: 0.75 V
Lead Channel Pacing Threshold Pulse Width: 0.5 ms
Lead Channel Pacing Threshold Pulse Width: 0.5 ms
Lead Channel Sensing Intrinsic Amplitude: 12 mV
Lead Channel Sensing Intrinsic Amplitude: 3.1 mV
Lead Channel Setting Pacing Amplitude: 2 V
Lead Channel Setting Pacing Amplitude: 2 V
Lead Channel Setting Pacing Pulse Width: 0.5 ms
Lead Channel Setting Sensing Sensitivity: 2 mV
Pulse Gen Model: 2272
Pulse Gen Serial Number: 3859349

## 2022-02-05 NOTE — Progress Notes (Signed)
Remote pacemaker transmission.   

## 2022-03-09 ENCOUNTER — Other Ambulatory Visit (HOSPITAL_COMMUNITY): Payer: Self-pay

## 2022-04-13 ENCOUNTER — Ambulatory Visit (INDEPENDENT_AMBULATORY_CARE_PROVIDER_SITE_OTHER): Payer: Medicare PPO

## 2022-04-13 DIAGNOSIS — I495 Sick sinus syndrome: Secondary | ICD-10-CM

## 2022-04-15 LAB — CUP PACEART REMOTE DEVICE CHECK
Battery Remaining Longevity: 101 mo
Battery Remaining Percentage: 83 %
Battery Voltage: 3.01 V
Brady Statistic AP VP Percent: 1 %
Brady Statistic AP VS Percent: 4.7 %
Brady Statistic AS VP Percent: 1 %
Brady Statistic AS VS Percent: 95 %
Brady Statistic RA Percent Paced: 4.6 %
Brady Statistic RV Percent Paced: 1 %
Date Time Interrogation Session: 20240221171912
Implantable Lead Connection Status: 753985
Implantable Lead Connection Status: 753985
Implantable Lead Implant Date: 20210823
Implantable Lead Implant Date: 20210823
Implantable Lead Location: 753859
Implantable Lead Location: 753860
Implantable Pulse Generator Implant Date: 20210823
Lead Channel Impedance Value: 360 Ohm
Lead Channel Impedance Value: 610 Ohm
Lead Channel Pacing Threshold Amplitude: 0.5 V
Lead Channel Pacing Threshold Amplitude: 0.75 V
Lead Channel Pacing Threshold Pulse Width: 0.5 ms
Lead Channel Pacing Threshold Pulse Width: 0.5 ms
Lead Channel Sensing Intrinsic Amplitude: 10.8 mV
Lead Channel Sensing Intrinsic Amplitude: 2.6 mV
Lead Channel Setting Pacing Amplitude: 2 V
Lead Channel Setting Pacing Amplitude: 2 V
Lead Channel Setting Pacing Pulse Width: 0.5 ms
Lead Channel Setting Sensing Sensitivity: 2 mV
Pulse Gen Model: 2272
Pulse Gen Serial Number: 3859349

## 2022-04-19 ENCOUNTER — Ambulatory Visit: Payer: Medicare PPO | Attending: Cardiovascular Disease | Admitting: Cardiovascular Disease

## 2022-04-19 ENCOUNTER — Encounter: Payer: Self-pay | Admitting: Cardiovascular Disease

## 2022-04-19 VITALS — BP 140/62 | HR 76 | Ht 67.0 in | Wt 173.4 lb

## 2022-04-19 DIAGNOSIS — I10 Essential (primary) hypertension: Secondary | ICD-10-CM

## 2022-04-19 DIAGNOSIS — I4719 Other supraventricular tachycardia: Secondary | ICD-10-CM

## 2022-04-19 DIAGNOSIS — I495 Sick sinus syndrome: Secondary | ICD-10-CM

## 2022-04-19 DIAGNOSIS — I350 Nonrheumatic aortic (valve) stenosis: Secondary | ICD-10-CM

## 2022-04-19 DIAGNOSIS — E782 Mixed hyperlipidemia: Secondary | ICD-10-CM

## 2022-04-19 DIAGNOSIS — I25118 Atherosclerotic heart disease of native coronary artery with other forms of angina pectoris: Secondary | ICD-10-CM

## 2022-04-19 DIAGNOSIS — I6523 Occlusion and stenosis of bilateral carotid arteries: Secondary | ICD-10-CM

## 2022-04-19 DIAGNOSIS — Z950 Presence of cardiac pacemaker: Secondary | ICD-10-CM | POA: Diagnosis not present

## 2022-04-19 DIAGNOSIS — E1151 Type 2 diabetes mellitus with diabetic peripheral angiopathy without gangrene: Secondary | ICD-10-CM

## 2022-04-19 NOTE — Patient Instructions (Signed)
Medication Instructions:  No changes *If you need a refill on your cardiac medications before your next appointment, please call your pharmacy*  Follow-Up: At [facility], you and your health needs are our priority.  As part of our continuing mission to provide you with exceptional heart care, we have created designated Provider Care Teams.  These Care Teams include your primary Cardiologist (physician) and Advanced Practice Providers (APPs -  Physician Assistants and Nurse Practitioners) who all work together to provide you with the care you need, when you need it.  We recommend signing up for the patient portal called "MyChart".  Sign up information is provided on this After Visit Summary.  MyChart is used to connect with patients for Virtual Visits (Telemedicine).  Patients are able to view lab/test results, encounter notes, upcoming appointments, etc.  Non-urgent messages can be sent to your provider as well.   To learn more about what you can do with MyChart, go to [email or web address].    Your next appointment:   1 year(s)  Provider:   Dr Sallyanne Kuster

## 2022-04-19 NOTE — Progress Notes (Unsigned)
Cardiology Office Note:    Date:  04/22/2022   ID:  ZADOK GROMAN, DOB Nov 17, 1940, MRN [id number]  PCP:  Tomasa Hose, NP  [facility] Cardiologist:  [name], MD  [facility] Electrophysiologist:  None   Referring MD: Tomasa Hose, NP   Chief Complaint  Patient presents with   Pacemaker Check     History of Present Illness:    Kreig JAMETRIUS ANDRING is a 82 y.o. male with a hx of PAD (bilateral iliac kissing stent placement October 2017, occlusion of right SFA with popliteal reconstitution, diffuse moderate left SFA stenosis), SVT, CAD (single-vessel disease ostial RCA stent 2018, occluded second posterolateral ventricular branch), moderate aortic stenosis, moderate bilateral carotid artery disease by CT, HTN, DM type 2 on insulin,  hypercholesterolemia (on statin + PCSK9 inhibitor).  In August 2021 he underwent implantation of a dual-chamber permanent pacemaker ([place]) for tachycardia-bradycardia syndrome (symptomatic sinus pauses and recurrent episodes of paroxysmal ectopic atrial tachycardia).    He is doing quite well from a cardiovascular point of view.  Since the pacemaker has been implanted he has not been troubled by palpitations syncope or dizziness.  He also denies dyspnea or angina at rest or with activity.  He does not have intermittent claudication.  Denies edema, orthopnea, PND or chest pain at rest or with activity.  Device function is normal.  He only has 4.5% atrial pacing and does not require ventricular pacing.  He has had a handful of episodes of atrial mode switch which appear to be paroxysmal atrial tachycardia up to 5 seconds in duration.  There has been no atrial fibrillation.  Follow-up echo in August 2023 shows that he still has normal left ventricular systolic function and moderate aortic stenosis (mean gradient 14 mmHg, dimensionless index 0.41).  Previous echo had showed a higher mean gradient 20 mmHg so this may be an  underestimation.   Glycemic control is worse with a hemoglobin A1c that is up to 8.1%.  He is creatinine is normal at 0.82 and his liver function tests are normal.  The most recent lipid profile showed an LDL cholesterol of 79 HDL 39.  Losartan was stopped in years past for hyperkalemia.  Most recent potassium was normal at 4.8.   Past Medical History:  Diagnosis Date   Adenomatous colon polyp 2000/2010   Arthritis    "lower back" (07/29/2016)   ASCVD (arteriosclerotic cardiovascular disease)    -luminal irregularities in 1998 and 2005; normal EF   Benign prostatic hypertrophy    status post transurethral resection of the prostate   CAD (coronary artery disease)    a. Cath 07/29/16 99% ostial RCA & 70% pRCA s/p cutting balloon angioplasty and single SYNERGY DES 2.5X28. 100% very Distal 1st RPLB lesion --> the occlusion site moved further distal with guidewire advancement;  40% ostial LAD; 50% Ost Cx to Prox Cx lesion.   Coronary artery disease    Dysphagia    Erectile dysfunction    GERD (gastroesophageal reflux disease)    Heart murmur    History of hiatal hernia    History of kidney stones    Hyperlipidemia    Hypertension    Seasonal allergies    Tobacco abuse    20 pack years; discontinued 1995   Type II diabetes mellitus ([place])     Past Surgical History:  Procedure Laterality Date   APPENDECTOMY     CATARACT EXTRACTION W/ INTRAOCULAR LENS  IMPLANT, BILATERAL Bilateral  COLONOSCOPY  07/20/2011   Dr. Gala Romney: multiple hyperplastic polyps. Surveillance 2018   COLONOSCOPY N/A 06/30/2016   Procedure: COLONOSCOPY;  Surgeon: Daneil Dolin, MD;  [facility];  Service: Endoscopy;  Laterality: N/A;  10:00am   COLONOSCOPY W/ BIOPSIES AND POLYPECTOMY  07/08/08, 06/2011   Dr Madolyn Frieze papilla, diminutive rectal polyp ablated the, left-sided diverticula, piecemeal polypectomy of hepatic flexure adenomatous polyp, diminutive polyps near hepatic flexure ablated   CORONARY  ANGIOPLASTY WITH STENT PLACEMENT  07/29/2016   CORONARY STENT INTERVENTION N/A 07/29/2016   Procedure: Coronary Stent Intervention;  Surgeon: Leonie Man, MD;  Location: [place] [facility];  Service: Cardiovascular;  Laterality: N/A;  RCA   ESOPHAGOGASTRODUODENOSCOPY  2013   erosive reflux esophagitis, s/p empiric dilation. chronic duodenitis.    LEFT HEART CATH AND CORONARY ANGIOGRAPHY N/A 07/29/2016   Procedure: Left Heart Cath and Coronary Angiography;  Surgeon: Leonie Man, MD;  Location: [place] [facility];  Service: Cardiovascular;  Laterality: N/A;   NASAL SEPTUM SURGERY     NASAL SINUS SURGERY     "cleaned out my sinus"   PACEMAKER IMPLANT N/A 10/15/2019   Procedure: PACEMAKER IMPLANT;  Surgeon: Sanda Klein, MD;  Location: [place] [facility];  Service: Cardiovascular;  Laterality: N/A;   PERIPHERAL VASCULAR CATHETERIZATION N/A 11/26/2015   Procedure: Abdominal Aortogram w/Lower Extremity;  Surgeon: Wellington Hampshire, MD;  Location: [place] [facility];  Service: Cardiovascular;  Laterality: N/A;   TONSILLECTOMY     TRANSURETHRAL RESECTION OF PROSTATE  2009    Current Medications: Current Meds  Medication Sig   acetaminophen (TYLENOL) 500 MG tablet Take 1,000 mg by mouth as needed.   albuterol (VENTOLIN HFA) 108 (90 Base) MCG/ACT inhaler Inhale 2 puffs into the lungs every 6 (six) hours as needed for wheezing or shortness of breath.   amLODipine (NORVASC) 5 MG tablet Take 1 tablet (5 mg total) by mouth daily.   aspirin EC 81 MG tablet Take 81 mg by mouth at bedtime.    atorvastatin (LIPITOR) 10 MG tablet Take 1 tablet (10 mg total) by mouth once a week.   Cholecalciferol (VITAMIN D3) 50 MCG (2000 UT) TABS Take 2,000 Units by mouth daily.   Continuous Blood Gluc Sensor (FREESTYLE LIBRE 2 SENSOR) MISC 1 Device by Does not apply route every 14 (fourteen) days.   Evolocumab (REPATHA SURECLICK) XX123456 MG/ML SOAJ Inject 1 mL into the skin every 14 (fourteen) days.   fluorometholone  (FML) 0.1 % ophthalmic suspension Place 1 drop into both eyes 2 (two) times daily.   glucose blood (ACCU-CHEK AVIVA PLUS) test strip USE AS DIRECTED TO CHECK BLOOD SUGAR 2X DAILY   insulin glargine (LANTUS SOLOSTAR) 100 UNIT/ML Solostar Pen Inject 28 Units into the skin every morning. (Patient taking differently: Inject 28 Units into the skin daily.)   Insulin Pen Needle (PEN NEEDLES) 32G X 4 MM MISC To use daily with Lantus solostar. Dx E11.9   magnesium oxide (MAG-OX) 400 MG tablet Take 1 tablet by mouth daily.   memantine (NAMENDA) 10 MG tablet Take 10 mg by mouth every evening.   metFORMIN (GLUCOPHAGE-XR) 500 MG 24 hr tablet TAKE 4 TABLETS BY MOUTH EVERY DAY (Patient taking differently: Take 1,000 mg by mouth in the morning and at bedtime.)   metoprolol tartrate (LOPRESSOR) 50 MG tablet TAKE 1 TABLET BY MOUTH TWICE A DAY   nitroGLYCERIN (NITROSTAT) 0.4 MG SL tablet Place 1 tablet (0.4 mg total) under the tongue every 5 (five)  minutes as needed for chest pain.   pantoprazole (PROTONIX) 40 MG tablet TAKE 1 TABLET(40 MG) BY MOUTH DAILY (Patient taking differently: Take 40 mg by mouth daily.)   RYBELSUS 14 MG TABS TAKE 1 TABLET BY MOUTH EVERY DAY (Patient taking differently: Take 1 tablet by mouth daily.)   Suvorexant 10 MG TABS Take 10 mg by mouth at bedtime.   tadalafil (CIALIS) 20 MG tablet Take 0.5-1 tablets (10-20 mg total) by mouth every other day as needed for erectile dysfunction. (Patient taking differently: Take 20 mg by mouth daily as needed for erectile dysfunction.)   tadalafil (CIALIS) 5 MG tablet Take 5 mg by mouth daily.   vitamin C (ASCORBIC ACID) 250 MG tablet Take 500 mg by mouth daily.     Allergies:   Plavix [clopidogrel bisulfate], Oxycontin [oxycodone hcl], Penicillins, Pioglitazone, Prednisone, and Simvastatin   Social History   Socioeconomic History   Marital status: Married    Spouse name: Not on file   Number of children: 2   Years of education: Not on file    Highest education level: Not on file  Occupational History   Occupation: Dietitian, 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    Comment: Gaffer of agriculture  Tobacco Use   Smoking status: Former    Packs/day: 1.00    Years: 15.00    Total pack years: 15.00    Types: Cigarettes    Start date: 03/08/1965    Quit date: 07/07/1993    Years since quitting: 28.8   Smokeless tobacco: Never  Vaping Use   Vaping Use: Never used  Substance and Sexual Activity   Alcohol use: No    Alcohol/week: 0.0 standard drinks of alcohol    Comment: 07/29/2016 "last drink was 4 wks ago; had a beer"   Drug use: No   Sexual activity: Not Currently  Other Topics Concern   Not on file  Social History Narrative   5 grandchildren    Social Determinants of Health   Financial Resource Strain: Low Risk  (03/24/2020)   Overall Financial Resource Strain (CARDIA)    Difficulty of Paying Living Expenses: Not hard at all  Food Insecurity: No Food Insecurity (03/24/2020)   Hunger Vital Sign    Worried About Running Out of Food in the Last Year: Never true    Ran Out of Food in the Last Year: Never true  Transportation Needs: No Transportation Needs (03/24/2020)   PRAPARE - Hydrologist (Medical): No    Lack of Transportation (Non-Medical): No  Physical Activity: Sufficiently Active (03/24/2020)   Exercise Vital Sign    Days of Exercise per Week: 4 days    Minutes of Exercise per Session: 40 min  Stress: No Stress Concern Present (03/24/2020)   [place]    Feeling of Stress : Not at all  Social Connections: Moderately Integrated (03/24/2020)   Social Connection and Isolation Panel [NHANES]    Frequency of Communication with Friends and Family: More than three times a week    Frequency of Social Gatherings with Friends and Family: More than three times a week    Attends Religious Services: More than 4 times per year     Active Member of [employer] or Organizations: No    Attends Archivist Meetings: Never    Marital Status: Married     Family History: The patient's family history includes Colon cancer (age of onset: 63) in his sister;  Diabetes in his brother and maternal grandmother; Ovarian cancer in his mother.  ROS:   Please see the history of present illness.   All other systems are reviewed and are negative.   EKGs/Labs/Other Studies Reviewed:    The following studies were reviewed today:  Echo 10/09/2021    1. Left ventricular ejection fraction, by estimation, is 60 to 65%. The  left ventricle has normal function. The left ventricle has no regional  wall motion abnormalities. Left ventricular diastolic parameters were  normal.   2. Right ventricular systolic function is normal. The right ventricular  size is normal.   3. The mitral valve is grossly normal. Trivial mitral valve  regurgitation. No evidence of mitral stenosis.   4. The aortic valve is calcified. There is moderate calcification of the  aortic valve. There is moderate thickening of the aortic valve. Aortic  valve regurgitation is mild to moderate. Mild to moderate aortic valve  stenosis. Aortic valve area, by VTI  measures 1.41 cm. Aortic valve mean gradient measures 13.5 mmHg. Aortic  valve Vmax measures 2.60 m/s.   Comparison(s): No significant change from prior study.   Conclusion(s)/Recommendation(s): Aortic stenosis is mild (by gradient) to  moderate (by DI). Overall appears stable compared to prior.    Echo 10/29/2020  1. Left ventricular ejection fraction, by estimation, is 60 to 65%. The  left ventricle has normal function. The left ventricle has no regional  wall motion abnormalities. Left ventricular diastolic parameters are  consistent with Grade I diastolic  dysfunction (impaired relaxation). Elevated left atrial pressure.   2. Right ventricular systolic function is normal. The right ventricular  size  is normal.   3. The mitral valve is normal in structure. Trivial mitral valve  regurgitation. No evidence of mitral stenosis.   4. The aortic valve is tricuspid. There is moderate calcification of the  aortic valve. There is moderate thickening of the aortic valve. Aortic  valve regurgitation is not visualized. Moderate aortic valve stenosis.  Aortic valve mean gradient measures  20.2 mmHg. Aortic valve Vmax measures 2.87 m/s.   5. The inferior vena cava is normal in size with greater than 50%  respiratory variability, suggesting right atrial pressure of 3 mmHg.   Comparison(s): No significant change from prior study. Prior images  reviewed side by side. There is very slight worsening of the aortic valve  gradients.   Event monitor 2 weeks 09/20/2019 Normal sinus rhythm with an average heart rate of 75 bpm. 14 runs of SVT, the longest lasted 9.4 seconds with a rate of 134 bpm. 3-second pause which happened at 11 in the morning.  This was a triggered event but symptoms were not specified. Frequent PACs with a burden of 10.1%.  Some of the triggered events correlated with PACs. Rare PVCs with a burden of less than 1%.   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 Recent Labs: 06/26/2021: ALT 32; BUN 21; Creatinine, Ser 0.83; Hemoglobin 13.2; Magnesium 1.3; Platelets 274; Potassium 3.7; Sodium 137  01/22/2022 hemoglobin A1c 8.1% Recent Lipid Panel    Component Value Date/Time   CHOL 57 12/20/2019 0903   CHOL 75 (L) 02/06/2019 1052   TRIG 50.0 12/20/2019 0903   TRIG 96 03/12/2008 0000   HDL 34.50 (L) 12/20/2019 0903   HDL 37 (L)  02/06/2019 1052   CHOLHDL 2 12/20/2019 0903   VLDL 10.0 12/20/2019 0903   LDLCALC 12 12/20/2019 0903   LDLCALC 24 02/06/2019 1052   LDLCALC 91  03/12/2008 0000   07/24/2021 Cholesterol 70, triglycerides 54, HDL 39, LDL 17  Physical Exam:    VS:  BP (!) 140/62 (BP Location: Left Arm, Patient Position: Sitting, Cuff Size: Normal)   Pulse 76   Ht '5\' 7"'$  (1.702 m)   Wt 173 lb 6.4 oz (78.7 kg)   SpO2 95%   BMI 27.16 kg/m     Wt Readings from Last 3 Encounters:  04/19/22 173 lb 6.4 oz (78.7 kg)  11/17/21 172 lb 12.8 oz (78.4 kg)  06/26/21 172 lb (78 kg)      General: Alert, oriented x3, no distress, healthy left subclavian pacemaker site. Head: no evidence of trauma, PERRL, EOMI, no exophtalmos or lid lag, no myxedema, no xanthelasma; normal ears, nose and oropharynx Neck: normal jugular venous pulsations and no hepatojugular reflux; brisk carotid pulses without delay and no carotid bruits Chest: clear to auscultation, no signs of consolidation by percussion or palpation, normal fremitus, symmetrical and full respiratory excursions Cardiovascular: normal position and quality of the apical impulse, regular rhythm, normal first and second heart sounds, no murmurs, rubs or gallops Abdomen: no tenderness or distention, no masses by palpation, no abnormal pulsatility or arterial bruits, normal bowel sounds, no hepatosplenomegaly Extremities: no clubbing, cyanosis or edema; 2+ radial, ulnar and brachial pulses bilaterally; 2+ right femoral, posterior tibial and dorsalis pedis pulses; 2+ left femoral, posterior tibial and dorsalis pedis pulses; no subclavian or femoral bruits Neurological: grossly nonfocal Psych: Normal mood and affect    ASSESSMENT:    1. Tachycardia-bradycardia syndrome ([place])   2. PAT (paroxysmal atrial tachycardia)   3. Pacemaker   4. Aortic valve stenosis, nonrheumatic   5. Coronary artery disease of native artery of native heart with stable angina pectoris ([place])   6. Bilateral carotid artery stenosis   7. Mixed hyperlipidemia   8. DM (diabetes mellitus), type 2 with peripheral vascular complications ([place])    9. Essential hypertension      PLAN:    In order of problems listed above:  Tachycardia-bradycardia syndrome/PAT: With the current medications and burden of atrial arrhythmias very well.  Continue metoprolol. PM: Normal device function, infrequent need for pacing.  Continue remote downloads every 3 months. AS: Moderate aortic stenosis mean gradient 20 mmHg, possibly underestimated on the more recent echo.  Asymptomatic.  He should call us if he develops exertional angina/dyspnea/syncope. PAD: Denies claudication. CAD: Denies exertional angina at this time on current medication (dual antianginal therapy with beta-blocker and calcium channel blocker)...  Known occluded posterior lateral ventricular branch, currently exertional angina is controlled with beta-blockers and calcium channel blocker. Bilateral moderate carotid artery stenosis: Follow-up ultrasound in August 2023 showed unchanged mild bilateral disease.  No symptoms to suggest TIA/CVA. HLP: Excellent LDL cholesterol level on [place]. DM: Deteriorated control, most recent hemoglobin A1c 8.0% in December 2023. HTN: Except low blood pressure control.  He tends to run around the low diastolic blood pressure.  Okay to keep systolic blood pressure [id number].  Medication Adjustments/Labs and Tests Ordered: Current medicines are reviewed at length with the patient today.  Concerns regarding medicines are outlined above.  Orders Placed This Encounter  Procedures   EKG 12-Lead    No orders of the defined types were placed in this encounter.    Patient Instructions  Medication Instructions:  No changes *  If you need a refill on your cardiac medications before your next appointment, please call your pharmacy*  Follow-Up: At [facility] you and your health needs are our priority.  As part of our continuing mission to provide you with exceptional heart care, we have created designated Provider Care Teams.  These Care Teams include  your primary Cardiologist (physician) and Advanced Practice Providers (APPs -  Physician Assistants and Nurse Practitioners) who all work together to provide you with the care you need, when you need it.  We recommend signing up for the patient portal called "MyChart".  Sign up information is provided on this After Visit Summary.  MyChart is used to connect with patients for Virtual Visits (Telemedicine).  Patients are able to view lab/test results, encounter notes, upcoming appointments, etc.  Non-urgent messages can be sent to your provider as well.   To learn more about what you can do with MyChart, go to [email or web address].    Your next appointment:   1 year(s)  Provider:   Dr Sallyanne Kuster    Signed, Sanda Klein, MD  04/22/2022 7:09 PM    [place]

## 2022-05-11 ENCOUNTER — Encounter: Payer: Self-pay | Admitting: Cardiovascular Disease

## 2022-05-11 ENCOUNTER — Ambulatory Visit: Payer: Medicare PPO | Attending: Cardiovascular Disease | Admitting: Cardiovascular Disease

## 2022-05-11 VITALS — BP 128/68 | HR 65 | Ht 67.0 in | Wt 174.0 lb

## 2022-05-11 DIAGNOSIS — I739 Peripheral vascular disease, unspecified: Secondary | ICD-10-CM | POA: Diagnosis not present

## 2022-05-11 DIAGNOSIS — E785 Hyperlipidemia, unspecified: Secondary | ICD-10-CM

## 2022-05-11 DIAGNOSIS — I10 Essential (primary) hypertension: Secondary | ICD-10-CM

## 2022-05-11 DIAGNOSIS — I2510 Atherosclerotic heart disease of native coronary artery without angina pectoris: Secondary | ICD-10-CM | POA: Diagnosis not present

## 2022-05-11 DIAGNOSIS — I6523 Occlusion and stenosis of bilateral carotid arteries: Secondary | ICD-10-CM

## 2022-05-11 DIAGNOSIS — I495 Sick sinus syndrome: Secondary | ICD-10-CM

## 2022-05-11 DIAGNOSIS — I350 Nonrheumatic aortic (valve) stenosis: Secondary | ICD-10-CM

## 2022-05-11 NOTE — Progress Notes (Signed)
Cardiology Office Note   Date:  05/11/2022   ID:  Collier, Kirchberg 27-Sep-1940, MRN [id number]  PCP:  Tomasa Hose, NP  Cardiologist: Dr. Fletcher Anon  No chief complaint on file.      History of Present Illness: Marcus Norton is a 82 y.o. male who presents for a follow-up visit regarding peripheral arterial disease, PSVT, carotid disease and aortic stenosis.   He has known history of  paroxysmal supraventricular tachycardia,  coronary artery disease, aortic stenosis, type 2 diabetes since 1990, hypertension and hyperlipidemia. He has known history of claudication . He did not tolerate cilostazol due to palpitations. He is s/p bilateral common iliac artery kissing stent placement in 11/2015. He is known to have long calcified occlusion of the right SFA with reconstitution in the popliteal artery above the knee and diffuse moderate disease affecting the left SFA.   He had an abnormal nuclear stress test in 2018.  Cardiac catheterization in June, 2018 showed severe ostial RCA stenosis and occluded RPL2 with nonobstructive disease affecting the left system.  He underwent successful angioplasty and drug-eluting stent placement to the ostial right coronary artery. He has known history of intolerance to statins and was able to tolerate only small dose of atorvastatin and Repatha.   He is status post pacemaker placement for tachybradycardia syndrome.    Most recent echocardiogram in August, 2023 showed normal LV systolic function with mild to moderate aortic stenosis with mean gradient of 14 mmHg and valve area of 1.4 cm grade.  He had dizziness that improved after starting magnesium supplement.  He has been doing well with no chest pain or shortness of breath.  He has no lower extremity claudication.  Past Medical History:  Diagnosis Date   Adenomatous colon polyp 2000/2010   Arthritis    "lower back" (07/29/2016)   ASCVD (arteriosclerotic cardiovascular disease)    -luminal  irregularities in 1998 and 2005; normal EF   Benign prostatic hypertrophy    status post transurethral resection of the prostate   CAD (coronary artery disease)    a. Cath 07/29/16 99% ostial RCA & 70% pRCA s/p cutting balloon angioplasty and single SYNERGY DES 2.5X28. 100% very Distal 1st RPLB lesion --> the occlusion site moved further distal with guidewire advancement;  40% ostial LAD; 50% Ost Cx to Prox Cx lesion.   Coronary artery disease    Dysphagia    Erectile dysfunction    GERD (gastroesophageal reflux disease)    Heart murmur    History of hiatal hernia    History of kidney stones    Hyperlipidemia    Hypertension    Seasonal allergies    Tobacco abuse    20 pack years; discontinued 1995   Type II diabetes mellitus ([place])     Past Surgical History:  Procedure Laterality Date   APPENDECTOMY     CATARACT EXTRACTION W/ INTRAOCULAR LENS  IMPLANT, BILATERAL Bilateral    COLONOSCOPY  07/20/2011   Dr. Gala Romney: multiple hyperplastic polyps. Surveillance 2018   COLONOSCOPY N/A 06/30/2016   Procedure: COLONOSCOPY;  Surgeon: Daneil Dolin, MD;  Location: [facility];  Service: Endoscopy;  Laterality: N/A;  10:00am   COLONOSCOPY W/ BIOPSIES AND POLYPECTOMY  07/08/08, 06/2011   Dr Madolyn Frieze papilla, diminutive rectal polyp ablated the, left-sided diverticula, piecemeal polypectomy of hepatic flexure adenomatous polyp, diminutive polyps near hepatic flexure ablated   CORONARY ANGIOPLASTY WITH STENT PLACEMENT  07/29/2016   CORONARY STENT INTERVENTION N/A 07/29/2016   Procedure:  Coronary Stent Intervention;  Surgeon: Leonie Man, MD;  Location: [place] [facility];  Service: Cardiovascular;  Laterality: N/A;  RCA   ESOPHAGOGASTRODUODENOSCOPY  2013   erosive reflux esophagitis, s/p empiric dilation. chronic duodenitis.    LEFT HEART CATH AND CORONARY ANGIOGRAPHY N/A 07/29/2016   Procedure: Left Heart Cath and Coronary Angiography;  Surgeon: Leonie Man, MD;  Location: [place] [facility];  Service: Cardiovascular;  Laterality: N/A;   NASAL SEPTUM SURGERY     NASAL SINUS SURGERY     "cleaned out my sinus"   PACEMAKER IMPLANT N/A 10/15/2019   Procedure: PACEMAKER IMPLANT;  Surgeon: Sanda Klein, MD;  Location: [place] [facility];  Service: Cardiovascular;  Laterality: N/A;   PERIPHERAL VASCULAR CATHETERIZATION N/A 11/26/2015   Procedure: Abdominal Aortogram w/Lower Extremity;  Surgeon: Wellington Hampshire, MD;  Location: [facility];  Service: Cardiovascular;  Laterality: N/A;   TONSILLECTOMY     TRANSURETHRAL RESECTION OF PROSTATE  2009     Current Outpatient Medications  Medication Sig Dispense Refill   acetaminophen (TYLENOL) 500 MG tablet Take 1,000 mg by mouth as needed.     albuterol (VENTOLIN HFA) 108 (90 Base) MCG/ACT inhaler Inhale 2 puffs into the lungs every 6 (six) hours as needed for wheezing or shortness of breath.     amLODipine (NORVASC) 5 MG tablet Take 1 tablet (5 mg total) by mouth daily. 90 tablet 1   aspirin EC 81 MG tablet Take 81 mg by mouth at bedtime.      atorvastatin (LIPITOR) 10 MG tablet Take 1 tablet (10 mg total) by mouth once a week. 90 tablet 1   Cholecalciferol (VITAMIN D3) 50 MCG (2000 UT) TABS Take 2,000 Units by mouth daily.     Continuous Blood Gluc Sensor (FREESTYLE LIBRE 2 SENSOR) MISC 1 Device by Does not apply route every 14 (fourteen) days. 6 each 3   Evolocumab (REPATHA SURECLICK) 983 MG/ML SOAJ Inject 1 mL into the skin every 14 (fourteen) days. 6 mL 3   fluorometholone (FML) 0.1 % ophthalmic suspension Place 1 drop into both eyes 2 (two) times daily.     glucose blood (ACCU-CHEK AVIVA PLUS) test strip USE AS DIRECTED TO CHECK BLOOD SUGAR 2X DAILY 100 strip 1   insulin glargine (LANTUS SOLOSTAR) 100 UNIT/ML Solostar Pen Inject 28 Units into the skin every morning. (Patient taking differently: Inject 28 Units into the skin daily.) 30 mL 3   Insulin Pen Needle (PEN NEEDLES) 32G X 4 MM MISC To use daily with Lantus solostar.  Dx E11.9 100 each 1   magnesium oxide (MAG-OX) 400 MG tablet Take 1 tablet by mouth daily.     memantine (NAMENDA) 10 MG tablet Take 10 mg by mouth every evening.     metFORMIN (GLUCOPHAGE-XR) 500 MG 24 hr tablet TAKE 4 TABLETS BY MOUTH EVERY DAY (Patient taking differently: Take 1,000 mg by mouth in the morning and at bedtime.) 360 tablet 1   metoprolol tartrate (LOPRESSOR) 50 MG tablet TAKE 1 TABLET BY MOUTH TWICE A DAY 180 tablet 2   nitroGLYCERIN (NITROSTAT) 0.4 MG SL tablet Place 1 tablet (0.4 mg total) under the tongue every 5 (five) minutes as needed for chest pain. 5 tablet 0   pantoprazole (PROTONIX) 40 MG tablet TAKE 1 TABLET(40 MG) BY MOUTH DAILY (Patient taking differently: Take 40 mg by mouth daily.) 90 tablet 1   RYBELSUS 14 MG TABS TAKE 1 TABLET BY MOUTH EVERY DAY (Patient taking differently:  Take 1 tablet by mouth daily.) 90 tablet 1   silodosin (RAPAFLO) 4 MG CAPS capsule Take 4 mg by mouth every evening.     tadalafil (CIALIS) 20 MG tablet Take 0.5-1 tablets (10-20 mg total) by mouth every other day as needed for erectile dysfunction. (Patient taking differently: Take 20 mg by mouth daily as needed for erectile dysfunction.) 5 tablet 1   tadalafil (CIALIS) 5 MG tablet Take 5 mg by mouth daily.     vitamin C (ASCORBIC ACID) 250 MG tablet Take 500 mg by mouth daily.     Suvorexant 10 MG TABS Take 10 mg by mouth at bedtime. (Patient not taking: Reported on 05/11/2022) 30 tablet 0   No current facility-administered medications for this visit.    Allergies:   Plavix [clopidogrel bisulfate], Oxycontin [oxycodone hcl], Penicillins, Pioglitazone, Prednisone, and Simvastatin    Social History:  The patient  reports that he quit smoking about 28 years ago. His smoking use included cigarettes. He started smoking about 57 years ago. He has a 15.00 pack-year smoking history. He has never used smokeless tobacco. He reports that he does not drink alcohol and does not use drugs.   Family  History:  The patient's family history includes Colon cancer (age of onset: 67) in his sister; Diabetes in his brother and maternal grandmother; Ovarian cancer in his mother.    ROS:  Please see the history of present illness.   Otherwise, review of systems are positive for none.   All other systems are reviewed and negative.    PHYSICAL EXAM: VS:  BP 128/68   Pulse 65   Ht 5\' 7"  (1.702 m)   Wt 174 lb (78.9 kg)   SpO2 97%   BMI 27.25 kg/m  , BMI Body mass index is 27.25 kg/m. GEN: Well nourished, well developed, in no acute distress  HEENT: normal  Neck: no JVD, carotid bruits, or masses Cardiac: RRR; no  rubs, or gallops,no edema . 2/6 crescendo decrescendo systolic murmur in the aortic area which is mid peaking Respiratory:  clear to auscultation bilaterally, normal work of breathing GI: soft, nontender, nondistended, + BS MS: no deformity or atrophy  Skin: warm and dry, no rash Neuro:  Strength and sensation are intact Psych: euthymic mood, full affect Vascular: Femoral pulse: Normal bilaterally.  Distal pulses are not palpable  EKG was not performed today   Recent Labs: 06/26/2021: ALT 32; BUN 21; Creatinine, Ser 0.83; Hemoglobin 13.2; Magnesium 1.3; Platelets 274; Potassium 3.7; Sodium 137    Lipid Panel    Component Value Date/Time   CHOL 57 12/20/2019 0903   CHOL 75 (L) 02/06/2019 1052   TRIG 50.0 12/20/2019 0903   TRIG 96 03/12/2008 0000   HDL 34.50 (L) 12/20/2019 0903   HDL 37 (L) 02/06/2019 1052   CHOLHDL 2 12/20/2019 0903   VLDL 10.0 12/20/2019 0903   LDLCALC 12 12/20/2019 0903   LDLCALC 24 02/06/2019 1052   LDLCALC 91 03/12/2008 0000      Wt Readings from Last 3 Encounters:  05/11/22 174 lb (78.9 kg)  04/19/22 173 lb 6.4 oz (78.7 kg)  11/17/21 172 lb 12.8 oz (78.4 kg)           No data to display            ASSESSMENT AND PLAN:  1.  Peripheral arterial disease: Previous iliac stents with chronically occluded right SFA.  Currently with no  claudications.  Continue medical therapy.    2.  Coronary artery disease involving native coronary arteries without angina: Continue aggressive medical therapy.    3. Hyperlipidemia: He is currently on atorvastatin and Repatha with most recent LDL of 12.    4. Essential hypertension: Blood pressure is well controlled on current medications.  5.  Moderate aortic stenosis: He is asymptomatic.  Plan repeat echocardiogram in 2025.  6.  Tachycardia-bradycardia syndrome, status post dual-chamber pacemaker placement. Continue to follow-up with Dr. Loletha Grayer.   7.  Moderate bilateral carotid disease: this was noted on CT scan.  However, most recent carotid Doppler in August showed mild stenosis overall.   Disposition:   FU with me in 6 months  Signed,  [name], MD  05/11/2022 10:10 AM    [place]

## 2022-05-11 NOTE — Patient Instructions (Signed)
Medication Instructions:  No changes *If you need a refill on your cardiac medications before your next appointment, please call your pharmacy*   Lab Work: None ordered If you have labs (blood work) drawn today and your tests are completely normal, you will receive your results only by: MyChart Message (if you have MyChart) OR A paper copy in the mail If you have any lab test that is abnormal or we need to change your treatment, we will call you to review the results.   Testing/Procedures: None ordered   Follow-Up: At [facility], you and your health needs are our priority.  As part of our continuing mission to provide you with exceptional heart care, we have created designated Provider Care Teams.  These Care Teams include your primary Cardiologist (physician) and Advanced Practice Providers (APPs -  Physician Assistants and Nurse Practitioners) who all work together to provide you with the care you need, when you need it.  We recommend signing up for the patient portal called "MyChart".  Sign up information is provided on this After Visit Summary.  MyChart is used to connect with patients for Virtual Visits (Telemedicine).  Patients are able to view lab/test results, encounter notes, upcoming appointments, etc.  Non-urgent messages can be sent to your provider as well.   To learn more about what you can do with MyChart, go to [email or web address].    Your next appointment:   6 month(s)  Provider:   Muhammad Arida, MD   

## 2022-05-13 NOTE — Progress Notes (Signed)
Remote pacemaker transmission.   

## 2022-07-13 ENCOUNTER — Ambulatory Visit (INDEPENDENT_AMBULATORY_CARE_PROVIDER_SITE_OTHER): Payer: Medicare PPO

## 2022-07-13 DIAGNOSIS — I495 Sick sinus syndrome: Secondary | ICD-10-CM | POA: Diagnosis not present

## 2022-07-13 LAB — CUP PACEART REMOTE DEVICE CHECK
Battery Remaining Longevity: 98 mo
Battery Remaining Percentage: 81 %
Battery Voltage: 3.01 V
Brady Statistic AP VP Percent: 1 %
Brady Statistic AP VS Percent: 4.7 %
Brady Statistic AS VP Percent: 1 %
Brady Statistic AS VS Percent: 95 %
Brady Statistic RA Percent Paced: 4.5 %
Brady Statistic RV Percent Paced: 1 %
Date Time Interrogation Session: 20240521125632
Implantable Lead Connection Status: 753985
Implantable Lead Connection Status: 753985
Implantable Lead Implant Date: 20210823
Implantable Lead Implant Date: 20210823
Implantable Lead Location: 753859
Implantable Lead Location: 753860
Implantable Pulse Generator Implant Date: 20210823
Lead Channel Impedance Value: 360 Ohm
Lead Channel Impedance Value: 600 Ohm
Lead Channel Pacing Threshold Amplitude: 0.5 V
Lead Channel Pacing Threshold Amplitude: 1 V
Lead Channel Pacing Threshold Pulse Width: 0.5 ms
Lead Channel Pacing Threshold Pulse Width: 0.5 ms
Lead Channel Sensing Intrinsic Amplitude: 10.3 mV
Lead Channel Sensing Intrinsic Amplitude: 2.6 mV
Lead Channel Setting Pacing Amplitude: 2 V
Lead Channel Setting Pacing Amplitude: 2 V
Lead Channel Setting Pacing Pulse Width: 0.5 ms
Lead Channel Setting Sensing Sensitivity: 2 mV
Pulse Gen Model: 2272
Pulse Gen Serial Number: 3859349

## 2022-08-02 ENCOUNTER — Other Ambulatory Visit: Payer: Self-pay | Admitting: Cardiovascular Disease

## 2022-08-02 NOTE — Telephone Encounter (Signed)
Refill request

## 2022-08-05 ENCOUNTER — Other Ambulatory Visit: Payer: Self-pay | Admitting: Cardiovascular Disease

## 2022-08-05 DIAGNOSIS — E785 Hyperlipidemia, unspecified: Secondary | ICD-10-CM

## 2022-08-05 DIAGNOSIS — I2510 Atherosclerotic heart disease of native coronary artery without angina pectoris: Secondary | ICD-10-CM

## 2022-08-05 NOTE — Telephone Encounter (Signed)
Refill request

## 2022-08-05 NOTE — Progress Notes (Signed)
Remote pacemaker transmission.   

## 2022-08-09 ENCOUNTER — Ambulatory Visit (HOSPITAL_BASED_OUTPATIENT_CLINIC_OR_DEPARTMENT_OTHER)
Admission: RE | Admit: 2022-08-09 | Discharge: 2022-08-09 | Disposition: A | Discharge status: Home / Self Care | Payer: Medicare PPO | Source: Ambulatory Visit | Attending: Cardiovascular Disease | Admitting: Cardiovascular Disease

## 2022-08-09 ENCOUNTER — Ambulatory Visit (HOSPITAL_COMMUNITY)
Admission: RE | Admit: 2022-08-09 | Discharge: 2022-08-09 | Disposition: A | Discharge status: Home / Self Care | Payer: Medicare PPO | Source: Ambulatory Visit | Attending: Cardiovascular Disease | Admitting: Cardiovascular Disease

## 2022-08-09 ENCOUNTER — Ambulatory Visit: Payer: Medicare PPO | Admitting: Gastroenterology

## 2022-08-09 DIAGNOSIS — I739 Peripheral vascular disease, unspecified: Secondary | ICD-10-CM | POA: Diagnosis present

## 2022-08-09 DIAGNOSIS — Z95828 Presence of other vascular implants and grafts: Secondary | ICD-10-CM | POA: Diagnosis present

## 2022-08-09 LAB — VAS US ABI WITH/WO TBI: Right ABI: 0.79

## 2022-08-11 ENCOUNTER — Other Ambulatory Visit: Payer: Self-pay | Admitting: *Deleted

## 2022-08-11 DIAGNOSIS — I739 Peripheral vascular disease, unspecified: Secondary | ICD-10-CM

## 2022-08-13 NOTE — Progress Notes (Unsigned)
GI Office Note    Referring Provider: Jettie Pagan, NP Primary Care Physician:  Julien Girt, PA-C  Primary Gastroenterologist: Gerrit Friends.Rourk, MD  Chief Complaint   Chief Complaint  Patient presents with   New Patient (Initial Visit)    Pt referred for constipation and bloating. Pt states he takes OTC Align   History of Present Illness   Marcus Norton is a 82 y.o. male presenting today at the request of Jettie Pagan, NP for constipation.   Colonoscopy May 2018: - The entire examined colon is normal.  - The examination was otherwise normal on direct and retroflexion views.  - No specimens collected. - No repeat colonoscopy due to age.   Last seen in our office 04/14/17. Doing okay overall.  Reports made him feel weird so he stopped taking it.  PCP started on Dexilant which she said made him feel the same way.  Has been using Tums or Alka-Seltzer off PPI.  GERD 2-3 times since then.  Making dietary changes that interested in trying lansoprazole.  Tried Zantac in the past. On MiraLAX intermittently for constipation but having a lot of gas when taking it. Not taking Colace, stopped due to his colon feeling washed out.  Patient reports of intermittent nausea but not vomiting.  No other upper or lower GI symptoms.  He was advised to use MiraLAX half to 1 dose as needed every day to every other day.  Will start on lansoprazole once daily.  Asked to ensure that his Alka-Seltzer did not include any NSAIDs.   Today: Has been having stomach tightness and constipation for a few months. The bloating is his main complaint. Has been taking Align probiotic was taking 1 every 1-2 weeks and now he is taking one almost everyday. Does consume some dairy and does occasionally have onions and broccoli. Generally having a BM every morning but has been a little slow the last couple of weeks and they have been hard. Does have to strain some. Used to use miralax but has had a lot of  gassiness from that.   Has had some mild nausea but took some pepto and was better. No vomiting.   No overt heartburn - seldom symptoms. No dysphagia, diarrhea, melena, brbpr.    Current Outpatient Medications  Medication Sig Dispense Refill   acetaminophen (TYLENOL) 500 MG tablet Take 1,000 mg by mouth as needed.     albuterol (VENTOLIN HFA) 108 (90 Base) MCG/ACT inhaler Inhale 2 puffs into the lungs every 6 (six) hours as needed for wheezing or shortness of breath.     amLODipine (NORVASC) 5 MG tablet Take 1 tablet (5 mg total) by mouth daily. 90 tablet 1   aspirin EC 81 MG tablet Take 81 mg by mouth at bedtime.      Cholecalciferol (VITAMIN D3) 50 MCG (2000 UT) TABS Take 2,000 Units by mouth daily.     Continuous Blood Gluc Sensor (FREESTYLE LIBRE 2 SENSOR) MISC 1 Device by Does not apply route every 14 (fourteen) days. 6 each 3   Evolocumab (REPATHA SURECLICK) 140 MG/ML SOAJ INJECT 1 ML INTO THE SKIN EVERY 14 (FOURTEEN) DAYS. 6 mL 1   fluorometholone (FML) 0.1 % ophthalmic suspension Place 1 drop into both eyes 2 (two) times daily.     glucose blood (ACCU-CHEK AVIVA PLUS) test strip USE AS DIRECTED TO CHECK BLOOD SUGAR 2X DAILY 100 strip 1   insulin glargine (LANTUS SOLOSTAR) 100 UNIT/ML Solostar Pen Inject 28 Units  into the skin every morning. (Patient taking differently: Inject 28 Units into the skin daily.) 30 mL 3   Insulin Pen Needle (PEN NEEDLES) 32G X 4 MM MISC To use daily with Lantus solostar. Dx E11.9 100 each 1   magnesium oxide (MAG-OX) 400 MG tablet Take 1 tablet by mouth daily.     memantine (NAMENDA) 10 MG tablet Take 10 mg by mouth every evening.     metFORMIN (GLUCOPHAGE-XR) 500 MG 24 hr tablet TAKE 4 TABLETS BY MOUTH EVERY DAY (Patient taking differently: Take 1,000 mg by mouth in the morning and at bedtime.) 360 tablet 1   metoprolol tartrate (LOPRESSOR) 50 MG tablet TAKE 1 TABLET BY MOUTH TWICE A DAY 180 tablet 2   pantoprazole (PROTONIX) 40 MG tablet TAKE 1 TABLET(40  MG) BY MOUTH DAILY (Patient taking differently: Take 40 mg by mouth daily.) 90 tablet 1   Probiotic Product (ALIGN PO) Take by mouth.     RYBELSUS 14 MG TABS TAKE 1 TABLET BY MOUTH EVERY DAY (Patient taking differently: Take 1 tablet by mouth daily.) 90 tablet 1   silodosin (RAPAFLO) 4 MG CAPS capsule Take 4 mg by mouth every evening.     tadalafil (CIALIS) 20 MG tablet Take 0.5-1 tablets (10-20 mg total) by mouth every other day as needed for erectile dysfunction. (Patient taking differently: Take 20 mg by mouth daily as needed for erectile dysfunction.) 5 tablet 1   vitamin C (ASCORBIC ACID) 250 MG tablet Take 500 mg by mouth daily.     atorvastatin (LIPITOR) 10 MG tablet Take 1 tablet (10 mg total) by mouth once a week. (Patient not taking: Reported on 08/16/2022) 90 tablet 1   nitroGLYCERIN (NITROSTAT) 0.4 MG SL tablet Place 1 tablet (0.4 mg total) under the tongue every 5 (five) minutes as needed for chest pain. (Patient not taking: Reported on 08/16/2022) 5 tablet 0   Suvorexant 10 MG TABS Take 10 mg by mouth at bedtime. (Patient not taking: Reported on 05/11/2022) 30 tablet 0   tadalafil (CIALIS) 5 MG tablet Take 5 mg by mouth daily. (Patient not taking: Reported on 08/16/2022)     No current facility-administered medications for this visit.    Past Medical History:  Diagnosis Date   Adenomatous colon polyp 2000/2010   Arthritis    "lower back" (07/29/2016)   ASCVD (arteriosclerotic cardiovascular disease)    -luminal irregularities in 1998 and 2005; normal EF   Benign prostatic hypertrophy    status post transurethral resection of the prostate   CAD (coronary artery disease)    a. Cath 07/29/16 99% ostial RCA & 70% pRCA s/p cutting balloon angioplasty and single SYNERGY DES 2.5X28. 100% very Distal 1st RPLB lesion --> the occlusion site moved further distal with guidewire advancement;  40% ostial LAD; 50% Ost Cx to Prox Cx lesion.   Coronary artery disease    Dysphagia    Erectile  dysfunction    GERD (gastroesophageal reflux disease)    Heart murmur    History of hiatal hernia    History of kidney stones    Hyperlipidemia    Hypertension    Seasonal allergies    Tobacco abuse    20 pack years; discontinued 1995   Type II diabetes mellitus (HCC)     Past Surgical History:  Procedure Laterality Date   APPENDECTOMY     CATARACT EXTRACTION W/ INTRAOCULAR LENS  IMPLANT, BILATERAL Bilateral    COLONOSCOPY  07/20/2011   Dr. Jena Gauss: multiple hyperplastic polyps.  Surveillance 2018   COLONOSCOPY N/A 06/30/2016   Procedure: COLONOSCOPY;  Surgeon: Corbin Ade, MD;  Location: [facility];  Service: Endoscopy;  Laterality: N/A;  10:00am   COLONOSCOPY W/ BIOPSIES AND POLYPECTOMY  07/08/08, 06/2011   Dr Marlowe Alt papilla, diminutive rectal polyp ablated the, left-sided diverticula, piecemeal polypectomy of hepatic flexure adenomatous polyp, diminutive polyps near hepatic flexure ablated   CORONARY ANGIOPLASTY WITH STENT PLACEMENT  07/29/2016   CORONARY STENT INTERVENTION N/A 07/29/2016   Procedure: Coronary Stent Intervention;  Surgeon: Marykay Lex, MD;  Location: [facility] INVASIVE CV LAB;  Service: Cardiovascular;  Laterality: N/A;  RCA   ESOPHAGOGASTRODUODENOSCOPY  2013   erosive reflux esophagitis, s/p empiric dilation. chronic duodenitis.    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   Procedure: PACEMAKER IMPLANT;  Surgeon: Thurmon Fair, MD;  Location: [facility] INVASIVE CV LAB;  Service: Cardiovascular;  Laterality: N/A;   PERIPHERAL VASCULAR CATHETERIZATION N/A 11/26/2015   Procedure: Abdominal Aortogram w/Lower Extremity;  Surgeon: Iran Ouch, MD;  Location: [facility] INVASIVE CV LAB;  Service:  Cardiovascular;  Laterality: N/A;   TONSILLECTOMY     TRANSURETHRAL RESECTION OF PROSTATE  2009    Family History  Problem Relation Age of Onset   Ovarian cancer Mother    Diabetes Brother    Colon cancer Sister 26       deceased from colon cancer   Diabetes Maternal Grandmother     Allergies as of 08/16/2022 - Review Complete 08/16/2022  Allergen Reaction Noted   Plavix [clopidogrel bisulfate] Shortness Of Breath 02/24/2016   Oxycontin [oxycodone hcl] Other (See Comments) 05/14/2012   Penicillins Other (See Comments) 04/30/2013   Pioglitazone Other (See Comments) 04/30/2013   Prednisone Other (See Comments) 07/14/2011   Simvastatin Other (See Comments) 05/14/2012    Social History   Socioeconomic History   Marital status: Married    Spouse name: Not on file   Number of children: 2   Years of education: Not on file   Highest education level: Not on file  Occupational History   Occupation: Counsellor, [place]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    Comment: Event organiser of agriculture  Tobacco Use   Smoking status: Former    Packs/day: 1.00    Years: 15.00    Additional pack years: 0.00    Total pack years: 15.00    Types: Cigarettes    Start date: 03/08/1965    Quit date: 07/07/1993    Years since quitting: 29.1   Smokeless tobacco: Never  Vaping Use   Vaping Use: Never used  Substance and Sexual Activity   Alcohol use: No    Alcohol/week: 0.0 standard drinks of alcohol    Comment: 07/29/2016 "last drink was 4 wks ago; had a beer"   Drug use: No   Sexual activity: Not Currently  Other Topics Concern   Not on file  Social History Narrative   5 grandchildren    Social Determinants of Health   Financial Resource Strain: Low Risk  (03/24/2020)   Overall Financial Resource Strain (CARDIA)    Difficulty of Paying Living Expenses: Not hard at all  Food Insecurity: No Food Insecurity (03/24/2020)   Hunger Vital Sign  Worried About Programme researcher, broadcasting/film/video in the Last Year: Never true     Ran Out of Food in the Last Year: Never true  Transportation Needs: No Transportation Needs (03/24/2020)   PRAPARE - Administrator, Civil Service (Medical): No    Lack of Transportation (Non-Medical): No  Physical Activity: Sufficiently Active (03/24/2020)   Exercise Vital Sign    Days of Exercise per Week: 4 days    Minutes of Exercise per Session: 40 min  Stress: No Stress Concern Present (03/24/2020)   [employer] of Occupational Health - Occupational Stress Questionnaire    Feeling of Stress : Not at all  Social Connections: Moderately Integrated (03/24/2020)   Social Connection and Isolation Panel [NHANES]    Frequency of Communication with Friends and Family: More than three times a week    Frequency of Social Gatherings with Friends and Family: More than three times a week    Attends Religious Services: More than 4 times per year    Active Member of [employer] or Organizations: No    Attends Banker Meetings: Never    Marital Status: Married  Catering manager Violence: Not At Risk (03/24/2020)   Humiliation, Afraid, Rape, and Kick questionnaire    Fear of Current or Ex-Partner: No    Emotionally Abused: No    Physically Abused: No    Sexually Abused: No     Review of Systems   Gen: Denies any fever, chills, fatigue, weight loss, lack of appetite.  CV: Denies chest pain, heart palpitations, peripheral edema, syncope.  Resp: Denies shortness of breath at rest or with exertion. Denies wheezing or cough.  GI: see HPI  GU : Denies urinary burning, urinary frequency, urinary hesitancy MS: Denies joint pain, muscle weakness, cramps, or limitation of movement.  Derm: Denies rash, itching, dry skin Psych: Denies depression, anxiety, memory loss, and confusion Heme: Denies bruising, bleeding, and enlarged lymph nodes.  Physical Exam   BP 127/68   Pulse 83   Temp 97.7 F (36.5 C)   Ht 5\' 7"  (1.702 m)   Wt 174 lb (78.9 kg)   BMI 27.25 kg/m    General:   Alert and oriented. Pleasant and cooperative. Well-nourished and well-developed.  Head:  Normocephalic and atraumatic. Eyes:  Without icterus, sclera clear and conjunctiva pink.  Ears:  Normal auditory acuity. Mouth:  No deformity or lesions, oral mucosa pink.   Abdomen:  +BS, soft, non-tender and non-distended. No HSM noted. No guarding or rebound. No masses appreciated. Rectal:  Deferred  Msk:  Symmetrical without gross deformities. Normal posture. Extremities:  Without edema. Neurologic:  Alert and  oriented x4;  grossly normal neurologically. Skin:  Intact without significant lesions or rashes. Psych:  Alert and cooperative. Normal mood and affect.   Assessment   Marcus Norton is a 82 y.o. male with a history of GERD, constipation, CAD, adenomatous colon polyps, HLD, HTN, and diabetes presenting today for evaluation of constipation and bloating.  Constipation, bloating: Patient reports a daily BM but reports over the last several weeks his stools have been more hard and described as "slow".  Has been taking align daily over the last several weeks, was previously only taking this once every couple of weeks.  He has complaints of some fullness to his/lower abdomen (bloating).  He denies any excessive gassiness, he does sometimes have gas producing foods and occasional dairy intake but denies any significant consumption.  He has not been on any current  bowel regimen other than a probiotic.  States he has tried MiraLAX in the past but this causes him to have a fair amount of gas therefore he has not taken it.  I suspect his bloating is primarily related to his constipation therefore I will give him NSAIDs to try today espoused him started and then continue stool softener.  He will also likely benefit from extra fiber in his diet however this may make his gassiness worse.  I also advised for him to start taking Gas-X once to twice daily water intake.  Despite his gassiness we may  consider adding fiber supplement after next visit if no improvement, stool softener.  Discussed some dietary changes today.  GERD: Controlled with pantoprazole 40 mg once daily.  PLAN   Avoid gas producing foods Gas ex 1-2 times daily as needed Linzess 145 mcg daily for 4 days After taking Linzess then start colace 100 mg once daily.  May consider adding fiber supplement after gassiness improved.  Increase water intake.  Follow up in 2 months.    Brooke Bonito, MSN, FNP-BC, AGACNP-BC [facility]

## 2022-08-16 ENCOUNTER — Ambulatory Visit: Payer: Medicare PPO | Admitting: Gastroenterology

## 2022-08-16 ENCOUNTER — Encounter: Payer: Self-pay | Admitting: Gastroenterology

## 2022-08-16 VITALS — BP 127/68 | HR 83 | Temp 97.7°F | Ht 67.0 in | Wt 174.0 lb

## 2022-08-16 DIAGNOSIS — K5900 Constipation, unspecified: Secondary | ICD-10-CM

## 2022-08-16 DIAGNOSIS — K219 Gastro-esophageal reflux disease without esophagitis: Secondary | ICD-10-CM | POA: Diagnosis not present

## 2022-08-16 DIAGNOSIS — R140 Abdominal distension (gaseous): Secondary | ICD-10-CM | POA: Diagnosis not present

## 2022-08-16 NOTE — Patient Instructions (Addendum)
Avoid gas-producing foods (eg, garlic, cabbage, legumes, onions, broccoli, brussel sprouts, wheat, and potatoes). Avoid chewing gum and sucking on hard candy and limit your intake of dairy.   You may use Gas-X 1-2 times daily as needed.  I am going to provide you some samples of Linzess today.  You will take 1 tablet daily for 4 days.  Quite possibly will have some diarrhea associated with this, this is normal.  After your 4 days of Linzess I want you to start taking Colace (docusate sodium) 100 mg once daily.  You can get this over-the-counter.  Try adding extra fiber into your diet over the next couple months.  If you like for Linzess and it is helpful then we can try to continuing this prescription.  Follow up in 2 months!  It was a pleasure to see you today. I want to create trusting relationships with patients. If you receive a survey regarding your visit,  I greatly appreciate you taking time to fill this out on paper or through your MyChart. I value your feedback.  Brooke Bonito, MSN, FNP-BC, AGACNP-BC [facility]

## 2022-10-12 ENCOUNTER — Ambulatory Visit (INDEPENDENT_AMBULATORY_CARE_PROVIDER_SITE_OTHER): Payer: Medicare PPO

## 2022-10-12 DIAGNOSIS — I495 Sick sinus syndrome: Secondary | ICD-10-CM

## 2022-10-12 LAB — CUP PACEART REMOTE DEVICE CHECK
Battery Remaining Longevity: 96 mo
Battery Remaining Percentage: 79 %
Battery Voltage: 3.01 V
Brady Statistic AP VP Percent: 1 %
Brady Statistic AP VS Percent: 4.7 %
Brady Statistic AS VP Percent: 1 %
Brady Statistic AS VS Percent: 95 %
Brady Statistic RA Percent Paced: 4.6 %
Brady Statistic RV Percent Paced: 1 %
Date Time Interrogation Session: 20240820053923
Implantable Lead Connection Status: 753985
Implantable Lead Connection Status: 753985
Implantable Lead Implant Date: 20210823
Implantable Lead Implant Date: 20210823
Implantable Lead Location: 753859
Implantable Lead Location: 753860
Implantable Pulse Generator Implant Date: 20210823
Lead Channel Impedance Value: 380 Ohm
Lead Channel Impedance Value: 510 Ohm
Lead Channel Pacing Threshold Amplitude: 0.5 V
Lead Channel Pacing Threshold Amplitude: 1 V
Lead Channel Pacing Threshold Pulse Width: 0.5 ms
Lead Channel Pacing Threshold Pulse Width: 0.5 ms
Lead Channel Sensing Intrinsic Amplitude: 10.5 mV
Lead Channel Sensing Intrinsic Amplitude: 2.7 mV
Lead Channel Setting Pacing Amplitude: 2 V
Lead Channel Setting Pacing Amplitude: 2 V
Lead Channel Setting Pacing Pulse Width: 0.5 ms
Lead Channel Setting Sensing Sensitivity: 2 mV
Pulse Gen Model: 2272
Pulse Gen Serial Number: 3859349

## 2022-10-14 ENCOUNTER — Ambulatory Visit (HOSPITAL_COMMUNITY)
Admission: RE | Admit: 2022-10-14 | Discharge: 2022-10-14 | Disposition: A | Discharge status: Home / Self Care | Payer: Medicare PPO | Source: Ambulatory Visit | Attending: Cardiovascular Disease | Admitting: Cardiovascular Disease

## 2022-10-14 DIAGNOSIS — I6523 Occlusion and stenosis of bilateral carotid arteries: Secondary | ICD-10-CM | POA: Diagnosis present

## 2022-10-14 DIAGNOSIS — I6529 Occlusion and stenosis of unspecified carotid artery: Secondary | ICD-10-CM | POA: Diagnosis present

## 2022-10-17 NOTE — Progress Notes (Unsigned)
GI Office Note    Referring Provider: Julien Girt, PA* Primary Care Physician:  Julien Girt, PA-C Primary Gastroenterologist: Gerrit Friends.Rourk, MD   Date:  10/18/2022  ID:  Marcus Norton, Marcus Norton [birth year], MRN [id number]   Chief Complaint   Chief Complaint  Patient presents with   Follow-up    Pt following up on constipation. No better   History of Present Illness  Marcus Norton is a 82 y.o. male with a history of GERD, constipation, CAD, adenomatous colon polyps, HLD, HTN, and diabetes presenting today for follow up of bloating and constipation.   Colonoscopy May 2018: - The entire examined colon is normal.  - The examination was otherwise normal on direct and retroflexion views.  - No specimens collected. - No repeat colonoscopy due to age.   Last office visit 08/16/22. Usually daily BM but taking longer to go and having bloating/fullness sensation which is his main complaint. Had bene taking daily probiotic. Tried miralax in the past but made gassiness worse. Advised to use gas ex as needed. Use Linzess for 4 days then start daily stool softener. Consider adding fiber after improvement in gassiness. Advised to increase water intake. Avoid gas producing foods.   Today: Has been taking Align probiotic daily and that is helping. Having almost every day but stools still are hard. Sometimes has to strain to get started. Has tried miralax within the last couple of years. Does not like taking it much. He does not remember if he took the Linzess and if he did he states that it did not change much. He states some ongoing bloating - unsure if worse in the mornings or evenings. Denies frequent consumption of dairy - does use a little milk with corn flakes in the mornings (has this almost everyday). Switches up his cereal in the mornings usually. Taking daily mag ox. Has some lower abdominal pain a few times. Has had some urgency within the last couple months but does not  happen often. Sometimes has some incomplete emptying. States his stools are bigger in diameter and long.   Has seldom reflux symptoms. No N/V, dysphagia. Controlled on pantoprazole.   Current Outpatient Medications  Medication Sig Dispense Refill   acetaminophen (TYLENOL) 500 MG tablet Take 1,000 mg by mouth as needed.     albuterol (VENTOLIN HFA) 108 (90 Base) MCG/ACT inhaler Inhale 2 puffs into the lungs every 6 (six) hours as needed for wheezing or shortness of breath.     amLODipine (NORVASC) 5 MG tablet Take 1 tablet (5 mg total) by mouth daily. 90 tablet 1   aspirin EC 81 MG tablet Take 81 mg by mouth at bedtime.      Cholecalciferol (VITAMIN D3) 50 MCG (2000 UT) TABS Take 2,000 Units by mouth daily.     Continuous Blood Gluc Sensor (FREESTYLE LIBRE 2 SENSOR) MISC 1 Device by Does not apply route every 14 (fourteen) days. 6 each 3   Evolocumab (REPATHA SURECLICK) 140 MG/ML SOAJ INJECT 1 ML INTO THE SKIN EVERY 14 (FOURTEEN) DAYS. 6 mL 1   fluorometholone (FML) 0.1 % ophthalmic suspension Place 1 drop into both eyes 2 (two) times daily.     glucose blood (ACCU-CHEK AVIVA PLUS) test strip USE AS DIRECTED TO CHECK BLOOD SUGAR 2X DAILY 100 strip 1   insulin glargine (LANTUS SOLOSTAR) 100 UNIT/ML Solostar Pen Inject 28 Units into the skin every morning. (Patient taking differently: Inject 28 Units into the skin daily.) 30 mL 3   Insulin  Pen Needle (PEN NEEDLES) 32G X 4 MM MISC To use daily with Lantus solostar. Dx E11.9 100 each 1   magnesium oxide (MAG-OX) 400 MG tablet Take 1 tablet by mouth daily.     memantine (NAMENDA) 10 MG tablet Take 10 mg by mouth every evening.     metFORMIN (GLUCOPHAGE-XR) 500 MG 24 hr tablet TAKE 4 TABLETS BY MOUTH EVERY DAY (Patient taking differently: Take 1,000 mg by mouth in the morning and at bedtime.) 360 tablet 1   metoprolol tartrate (LOPRESSOR) 50 MG tablet TAKE 1 TABLET BY MOUTH TWICE A DAY 180 tablet 2   nitroGLYCERIN (NITROSTAT) 0.4 MG SL tablet Place 1  tablet (0.4 mg total) under the tongue every 5 (five) minutes as needed for chest pain. 5 tablet 0   pantoprazole (PROTONIX) 40 MG tablet TAKE 1 TABLET(40 MG) BY MOUTH DAILY (Patient taking differently: Take 40 mg by mouth daily.) 90 tablet 1   Probiotic Product (ALIGN PO) Take by mouth.     RYBELSUS 14 MG TABS TAKE 1 TABLET BY MOUTH EVERY DAY (Patient taking differently: Take 1 tablet by mouth daily.) 90 tablet 1   silodosin (RAPAFLO) 4 MG CAPS capsule Take 4 mg by mouth every evening.     tadalafil (CIALIS) 20 MG tablet Take 0.5-1 tablets (10-20 mg total) by mouth every other day as needed for erectile dysfunction. (Patient taking differently: Take 20 mg by mouth daily as needed for erectile dysfunction.) 5 tablet 1   vitamin C (ASCORBIC ACID) 250 MG tablet Take 500 mg by mouth daily.     atorvastatin (LIPITOR) 10 MG tablet Take 1 tablet (10 mg total) by mouth once a week. (Patient not taking: Reported on 08/16/2022) 90 tablet 1   Suvorexant 10 MG TABS Take 10 mg by mouth at bedtime. (Patient not taking: Reported on 05/11/2022) 30 tablet 0   tadalafil (CIALIS) 5 MG tablet Take 5 mg by mouth daily. (Patient not taking: Reported on 08/16/2022)     No current facility-administered medications for this visit.    Past Medical History:  Diagnosis Date   Adenomatous colon polyp 2000/2010   Arthritis    "lower back" (07/29/2016)   ASCVD (arteriosclerotic cardiovascular disease)    -luminal irregularities in 1998 and 2005; normal EF   Benign prostatic hypertrophy    status post transurethral resection of the prostate   CAD (coronary artery disease)    a. Cath 07/29/16 99% ostial RCA & 70% pRCA s/p cutting balloon angioplasty and single SYNERGY DES 2.5X28. 100% very Distal 1st RPLB lesion --> the occlusion site moved further distal with guidewire advancement;  40% ostial LAD; 50% Ost Cx to Prox Cx lesion.   Coronary artery disease    Dysphagia    Erectile dysfunction    GERD (gastroesophageal reflux  disease)    Heart murmur    History of hiatal hernia    History of kidney stones    Hyperlipidemia    Hypertension    Seasonal allergies    Tobacco abuse    20 pack years; discontinued 1995   Type II diabetes mellitus (HCC)    Past Surgical History:  Procedure Laterality Date   APPENDECTOMY     CATARACT EXTRACTION W/ INTRAOCULAR LENS  IMPLANT, BILATERAL Bilateral    COLONOSCOPY  07/20/2011   Dr. Jena Gauss: multiple hyperplastic polyps. Surveillance 2018   COLONOSCOPY N/A 06/30/2016   Procedure: COLONOSCOPY;  Surgeon: Corbin Ade, MD;  Location: [facility];  Service: Endoscopy;  Laterality: N/A;  10:00am   COLONOSCOPY W/ BIOPSIES AND POLYPECTOMY  07/08/08, 06/2011   Dr Marlowe Alt papilla, diminutive rectal polyp ablated the, left-sided diverticula, piecemeal polypectomy of hepatic flexure adenomatous polyp, diminutive polyps near hepatic flexure ablated   CORONARY ANGIOPLASTY WITH STENT PLACEMENT  07/29/2016   CORONARY STENT INTERVENTION N/A 07/29/2016   Procedure: Coronary Stent Intervention;  Surgeon: Marykay Lex, MD;  Location: [facility] INVASIVE CV LAB;  Service: Cardiovascular;  Laterality: N/A;  RCA   ESOPHAGOGASTRODUODENOSCOPY  2013   erosive reflux esophagitis, s/p empiric dilation. chronic duodenitis.    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   Procedure: PACEMAKER IMPLANT;  Surgeon: Thurmon Fair, MD;  Location: [facility] INVASIVE CV LAB;  Service: Cardiovascular;  Laterality: N/A;   PERIPHERAL VASCULAR CATHETERIZATION N/A 11/26/2015   Procedure: Abdominal Aortogram w/Lower Extremity;  Surgeon: Iran Ouch, MD;  Location: [facility] INVASIVE CV LAB;  Service: Cardiovascular;  Laterality: N/A;    TONSILLECTOMY     TRANSURETHRAL RESECTION OF PROSTATE  2009    Family History  Problem Relation Age of Onset   Ovarian cancer Mother    Diabetes Brother    Colon cancer Sister 24       deceased from colon cancer   Diabetes Maternal Grandmother     Allergies as of 10/18/2022 - Review Complete 10/18/2022  Allergen Reaction Noted   Plavix [clopidogrel bisulfate] Shortness Of Breath 02/24/2016   Oxycontin [oxycodone hcl] Other (See Comments) 05/14/2012   Penicillins Other (See Comments) 04/30/2013   Pioglitazone Other (See Comments) 04/30/2013   Prednisone Other (See Comments) 07/14/2011   Simvastatin Other (See Comments) 05/14/2012    Social History   Socioeconomic History   Marital status: Married    Spouse name: Not on file   Number of children: 2   Years of education: Not on file   Highest education level: Not on file  Occupational History   Occupation: Counsellor, [place]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    Comment: Event organiser of agriculture  Tobacco Use   Smoking status: Former    Current packs/day: 0.00    Average packs/day: 1 pack/day for 28.3 years (28.3 ttl pk-yrs)    Types: Cigarettes    Start date: 03/08/1965    Quit date: 07/07/1993    Years since quitting: 29.3   Smokeless tobacco: Never  Vaping Use   Vaping status: Never Used  Substance and Sexual Activity   Alcohol use: No    Alcohol/week: 0.0 standard drinks of alcohol    Comment: 07/29/2016 "last drink was 4 wks ago; had a beer"   Drug use: No   Sexual activity: Not Currently  Other Topics Concern   Not on file  Social History Narrative   5 grandchildren    Social Determinants of Health   Financial Resource Strain: Low Risk  (07/27/2022)   Received from [facility], [facility]   Overall Financial Resource Strain (CARDIA)    Difficulty of Paying Living Expenses: Not hard at all  Food Insecurity: No Food Insecurity (07/27/2022)   Received [facility], [facility]   Hunger Vital Sign    Worried  About Running Out of Food in the Last Year: Never true  Ran Out of Food in the Last Year: Never true  Transportation Needs: No Transportation Needs (07/27/2022)   Received from [facility], [facility]   PRAPARE - Transportation    Lack of Transportation (Medical): No    Lack of Transportation (Non-Medical): No  Physical Activity: Insufficiently Active (07/27/2022)   Received from [facility], [facility]   Exercise Vital Sign    Days of Exercise per Week: 7 days    Minutes of Exercise per Session: 10 min  Stress: No Stress Concern Present (07/27/2022)   Received from [facility], [facility] of Occupational Health - Occupational Stress Questionnaire    Feeling of Stress : Not at all  Social Connections: Socially Integrated (07/27/2022)   Received from [facility], [facility]   Social Network    How would you rate your social network (family, work, friends)?: Good participation with social networks   Review of Systems   Gen: Denies fever, chills, anorexia. Denies fatigue, weakness, weight loss.  CV: Denies chest pain, palpitations, syncope, peripheral edema, and claudication. Resp: Denies dyspnea at rest, cough, wheezing, coughing up blood, and pleurisy. GI: See HPI Derm: Denies rash, itching, dry skin Psych: Denies depression, anxiety, memory loss, confusion. No homicidal or suicidal ideation.  Heme: Denies bruising, bleeding, and enlarged lymph nodes.  Physical Exam   BP 134/65   Pulse 65   Temp 98.6 F (37 C)   Ht 5\' 7"  (1.702 m)   Wt 172 lb (78 kg)   BMI 26.94 kg/m   General:   Alert and oriented. No distress noted. Pleasant and cooperative.  Head:  Normocephalic and atraumatic. Eyes:  Conjuctiva clear without scleral icterus. Mouth:  Oral mucosa pink and moist. Good dentition. No lesions. Lungs:  Clear to auscultation bilaterally. No wheezes, rales, or rhonchi. No distress.  Heart:  S1, S2 present without murmurs appreciated.   Abdomen:  +BS, soft, non-tender and non-distended. Presence of midline ventral hernia, non tender. No rebound or guarding. No HSM or masses noted. Rectal: deferred Msk:  Symmetrical without gross deformities. Normal posture. Extremities:  Without edema. Neurologic:  Alert and  oriented x4 Psych:  Alert and cooperative. Normal mood and affect.   Assessment  Marcus Norton is a 82 y.o. male with a history of  GERD, constipation, CAD, adenomatous colon polyps, HLD, HTN, and diabetes  presenting today for follow up of constipation and bloating.  Constipation, bloating: Having a daily bowel movement but reports stools have been hard and also complains of incomplete emptying.  Continues to take align daily as well as stool softener but has not noticed much improvement.  Continues to complain of some bloating as well.  He reportedly has tried MiraLAX in the past but did not feel like it was super helpful.  He is unsure if he took the Linzess samples that were given to him previously.  We discussed options today and came to a shared decision that he would retry MiraLAX daily and increase his stool softener to twice daily while avoiding dairy products.  If no improvement within a few weeks then he will go to have abdominal x-ray performed and then we would likely try Linzess 290 mcg daily.  GERD: Controlled with pantoprazole 40 mg once daily.   PLAN   Avoid dairy Avoid common gas producing foods.  KUB Start MiraLAX 17 g once daily in 8 ounces of water. Increase stool softener to twice daily.  Take daily fiber supplement Continue pantoprazole 40  mg once daily.  Continue daily probiotic. Follow up in 2 months.     Brooke Bonito, MSN, FNP-BC, AGACNP-BC [facility]

## 2022-10-18 ENCOUNTER — Ambulatory Visit: Payer: Medicare PPO | Admitting: Gastroenterology

## 2022-10-18 ENCOUNTER — Encounter: Payer: Self-pay | Admitting: Gastroenterology

## 2022-10-18 VITALS — BP 134/65 | HR 65 | Temp 98.6°F | Ht 67.0 in | Wt 172.0 lb

## 2022-10-18 DIAGNOSIS — K219 Gastro-esophageal reflux disease without esophagitis: Secondary | ICD-10-CM | POA: Diagnosis not present

## 2022-10-18 DIAGNOSIS — R140 Abdominal distension (gaseous): Secondary | ICD-10-CM

## 2022-10-18 DIAGNOSIS — K5900 Constipation, unspecified: Secondary | ICD-10-CM | POA: Diagnosis not present

## 2022-10-18 NOTE — Patient Instructions (Addendum)
Start MiraLAX 17 g once daily in 8 ounces of water.  Increase your stool softener to twice daily.  He would likely benefit from a daily fiber supplement such as Benefiber. Continue taking your probiotic daily for now.  Continue to limit your dairy products and avoid gas-producing foods (eg, cabbage, legumes, onions, broccoli, brussel sprouts, wheat, and potatoes).   I have ordered an x-ray of your abdomen for you to complete in the next few weeks if you do not have any improvement in stool frequency or consistency.  It was a pleasure to see you today. I want to create trusting relationships with patients. If you receive a survey regarding your visit,  I greatly appreciate you taking time to fill this out on paper or through your MyChart. I value your feedback.  Brooke Bonito, MSN, FNP-BC, AGACNP-BC [facility]

## 2022-10-21 ENCOUNTER — Encounter: Payer: Self-pay | Admitting: *Deleted

## 2022-10-22 NOTE — Progress Notes (Signed)
Remote pacemaker transmission.   

## 2022-11-16 ENCOUNTER — Encounter: Payer: Self-pay | Admitting: Cardiovascular Disease

## 2022-11-16 ENCOUNTER — Ambulatory Visit: Payer: Medicare PPO | Attending: Cardiovascular Disease | Admitting: Cardiovascular Disease

## 2022-11-16 VITALS — BP 138/62 | HR 70 | Ht 67.0 in | Wt 171.2 lb

## 2022-11-16 DIAGNOSIS — I6523 Occlusion and stenosis of bilateral carotid arteries: Secondary | ICD-10-CM

## 2022-11-16 DIAGNOSIS — I10 Essential (primary) hypertension: Secondary | ICD-10-CM

## 2022-11-16 DIAGNOSIS — I2510 Atherosclerotic heart disease of native coronary artery without angina pectoris: Secondary | ICD-10-CM

## 2022-11-16 DIAGNOSIS — I495 Sick sinus syndrome: Secondary | ICD-10-CM

## 2022-11-16 DIAGNOSIS — I739 Peripheral vascular disease, unspecified: Secondary | ICD-10-CM

## 2022-11-16 DIAGNOSIS — E785 Hyperlipidemia, unspecified: Secondary | ICD-10-CM | POA: Diagnosis not present

## 2022-11-16 DIAGNOSIS — I350 Nonrheumatic aortic (valve) stenosis: Secondary | ICD-10-CM

## 2022-11-16 NOTE — Progress Notes (Signed)
Cardiology Office Note   Date:  11/16/2022   ID:  Marcus, Norton January 22, 1941, MRN [id number]  PCP:  Julien Girt, PA-C  Cardiologist: Dr. Kirke Corin  No chief complaint on file.      History of Present Illness: Marcus Norton is a 82 y.o. male who presents for a follow-up visit regarding peripheral arterial disease, PSVT, carotid disease and aortic stenosis.   He has known history of  paroxysmal supraventricular tachycardia,  coronary artery disease, aortic stenosis, type 2 diabetes since 1990, hypertension and hyperlipidemia. He has known history of claudication . He did not tolerate cilostazol due to palpitations. He is s/p bilateral common iliac artery kissing stent placement in 11/2015. He is known to have long calcified occlusion of the right SFA with reconstitution in the popliteal artery above the knee and diffuse moderate disease affecting the left SFA.   He had an abnormal nuclear stress test in 2018.  Cardiac catheterization in June, 2018 showed severe ostial RCA stenosis and occluded RPL2 with nonobstructive disease affecting the left system.  He underwent successful angioplasty and drug-eluting stent placement to the ostial right coronary artery. He has known history of intolerance to statins but has been doing well with Repatha.    He is status post pacemaker placement for tachybradycardia syndrome.    Most recent echocardiogram in August, 2023 showed normal LV systolic function with mild to moderate aortic stenosis with mean gradient of 14 mmHg and valve area of 1.4 cm grade.  He has been doing very well with no chest pain, shortness of breath or palpitations.  No significant lower extremity claudication.  Past Medical History:  Diagnosis Date   Adenomatous colon polyp 2000/2010   Arthritis    "lower back" (07/29/2016)   ASCVD (arteriosclerotic cardiovascular disease)    -luminal irregularities in 1998 and 2005; normal EF   Benign prostatic hypertrophy     status post transurethral resection of the prostate   CAD (coronary artery disease)    a. Cath 07/29/16 99% ostial RCA & 70% pRCA s/p cutting balloon angioplasty and single SYNERGY DES 2.5X28. 100% very Distal 1st RPLB lesion --> the occlusion site moved further distal with guidewire advancement;  40% ostial LAD; 50% Ost Cx to Prox Cx lesion.   Coronary artery disease    Dysphagia    Erectile dysfunction    GERD (gastroesophageal reflux disease)    Heart murmur    History of hiatal hernia    History of kidney stones    Hyperlipidemia    Hypertension    Seasonal allergies    Tobacco abuse    20 pack years; discontinued 1995   Type II diabetes mellitus (HCC)     Past Surgical History:  Procedure Laterality Date   APPENDECTOMY     CATARACT EXTRACTION W/ INTRAOCULAR LENS  IMPLANT, BILATERAL Bilateral    COLONOSCOPY  07/20/2011   Dr. Jena Gauss: multiple hyperplastic polyps. Surveillance 2018   COLONOSCOPY N/A 06/30/2016   Procedure: COLONOSCOPY;  Surgeon: Corbin Ade, MD;  Location: [facility];  Service: Endoscopy;  Laterality: N/A;  10:00am   COLONOSCOPY W/ BIOPSIES AND POLYPECTOMY  07/08/08, 06/2011   Dr Marlowe Alt papilla, diminutive rectal polyp ablated the, left-sided diverticula, piecemeal polypectomy of hepatic flexure adenomatous polyp, diminutive polyps near hepatic flexure ablated   CORONARY ANGIOPLASTY WITH STENT PLACEMENT  07/29/2016   CORONARY STENT INTERVENTION N/A 07/29/2016   Procedure: Coronary Stent Intervention;  Surgeon: Marykay Lex, MD;  Location: [facility] INVASIVE CV  LAB;  Service: Cardiovascular;  Laterality: N/A;  RCA   ESOPHAGOGASTRODUODENOSCOPY  2013   erosive reflux esophagitis, s/p empiric dilation. chronic duodenitis.    LEFT HEART CATH AND CORONARY ANGIOGRAPHY N/A 07/29/2016   Procedure: Left Heart Cath and Coronary Angiography;  Surgeon: Marykay Lex, MD;  Location: [facility] INVASIVE CV LAB;  Service: Cardiovascular;  Laterality: N/A;   NASAL SEPTUM SURGERY      NASAL SINUS SURGERY     "cleaned out my sinus"   PACEMAKER IMPLANT N/A 10/15/2019   Procedure: PACEMAKER IMPLANT;  Surgeon: Thurmon Fair, MD;  Location: [facility] INVASIVE CV LAB;  Service: Cardiovascular;  Laterality: N/A;   PERIPHERAL VASCULAR CATHETERIZATION N/A 11/26/2015   Procedure: Abdominal Aortogram w/Lower Extremity;  Surgeon: Iran Ouch, MD;  Location: [facility] INVASIVE CV LAB;  Service: Cardiovascular;  Laterality: N/A;   TONSILLECTOMY     TRANSURETHRAL RESECTION OF PROSTATE  2009     Current Outpatient Medications  Medication Sig Dispense Refill   acetaminophen (TYLENOL) 500 MG tablet Take 1,000 mg by mouth as needed.     albuterol (VENTOLIN HFA) 108 (90 Base) MCG/ACT inhaler Inhale 2 puffs into the lungs every 6 (six) hours as needed for wheezing or shortness of breath.     amLODipine (NORVASC) 5 MG tablet Take 1 tablet (5 mg total) by mouth daily. 90 tablet 1   aspirin EC 81 MG tablet Take 81 mg by mouth at bedtime.      Cholecalciferol (VITAMIN D3) 50 MCG (2000 UT) TABS Take 2,000 Units by mouth daily.     Continuous Blood Gluc Sensor (FREESTYLE LIBRE 2 SENSOR) MISC 1 Device by Does not apply route every 14 (fourteen) days. 6 each 3   Evolocumab (REPATHA SURECLICK) 140 MG/ML SOAJ INJECT 1 ML INTO THE SKIN EVERY 14 (FOURTEEN) DAYS. 6 mL 1   glucose blood (ACCU-CHEK AVIVA PLUS) test strip USE AS DIRECTED TO CHECK BLOOD SUGAR 2X DAILY 100 strip 1   insulin glargine (LANTUS SOLOSTAR) 100 UNIT/ML Solostar Pen Inject 28 Units into the skin every morning. (Patient taking differently: Inject 28 Units into the skin daily.) 30 mL 3   Insulin Pen Needle (PEN NEEDLES) 32G X 4 MM MISC To use daily with Lantus solostar. Dx E11.9 100 each 1   magnesium oxide (MAG-OX) 400 MG tablet Take 1 tablet by mouth daily.     memantine (NAMENDA) 10 MG tablet Take 10 mg by mouth every evening.     metFORMIN (GLUCOPHAGE-XR) 500 MG 24 hr tablet TAKE 4 TABLETS BY MOUTH EVERY DAY (Patient taking differently: Take  1,000 mg by mouth in the morning and at bedtime.) 360 tablet 1   metoprolol tartrate (LOPRESSOR) 50 MG tablet TAKE 1 TABLET BY MOUTH TWICE A DAY 180 tablet 2   nitroGLYCERIN (NITROSTAT) 0.4 MG SL tablet Place 1 tablet (0.4 mg total) under the tongue every 5 (five) minutes as needed for chest pain. 5 tablet 0   pantoprazole (PROTONIX) 40 MG tablet TAKE 1 TABLET(40 MG) BY MOUTH DAILY (Patient taking differently: Take 40 mg by mouth daily.) 90 tablet 1   Probiotic Product (ALIGN PO) Take by mouth.     RYBELSUS 14 MG TABS TAKE 1 TABLET BY MOUTH EVERY DAY (Patient taking differently: Take 1 tablet by mouth daily.) 90 tablet 1   silodosin (RAPAFLO) 4 MG CAPS capsule Take 4 mg by mouth every evening.     tadalafil (CIALIS) 5 MG tablet Take 5 mg by mouth daily.     vitamin  C (ASCORBIC ACID) 250 MG tablet Take 500 mg by mouth daily.     atorvastatin (LIPITOR) 10 MG tablet Take 1 tablet (10 mg total) by mouth once a week. (Patient not taking: Reported on 11/16/2022) 90 tablet 1   fluorometholone (FML) 0.1 % ophthalmic suspension Place 1 drop into both eyes 2 (two) times daily. (Patient not taking: Reported on 11/16/2022)     Suvorexant 10 MG TABS Take 10 mg by mouth at bedtime. (Patient not taking: Reported on 11/16/2022) 30 tablet 0   tadalafil (CIALIS) 20 MG tablet Take 0.5-1 tablets (10-20 mg total) by mouth every other day as needed for erectile dysfunction. (Patient not taking: Reported on 11/16/2022) 5 tablet 1   No current facility-administered medications for this visit.    Allergies:   Plavix [clopidogrel bisulfate], Oxycontin [oxycodone hcl], Penicillins, Pioglitazone, Prednisone, and Simvastatin    Social History:  The patient  reports that he quit smoking about 29 years ago. His smoking use included cigarettes. He started smoking about 57 years ago. He has a 28.3 pack-year smoking history. He has never used smokeless tobacco. He reports that he does not drink alcohol and does not use drugs.    Family History:  The patient's family history includes Colon cancer (age of onset: 91) in his sister; Diabetes in his brother and maternal grandmother; Ovarian cancer in his mother.    ROS:  Please see the history of present illness.   Otherwise, review of systems are positive for none.   All other systems are reviewed and negative.    PHYSICAL EXAM: VS:  BP 138/62 (BP Location: Left Arm, Patient Position: Sitting, Cuff Size: Normal)   Pulse 70   Ht 5\' 7"  (1.702 m)   Wt 171 lb 3.2 oz (77.7 kg)   SpO2 93%   BMI 26.81 kg/m  , BMI Body mass index is 26.81 kg/m. GEN: Well nourished, well developed, in no acute distress  HEENT: normal  Neck: no JVD, carotid bruits, or masses Cardiac: RRR; no  rubs, or gallops,no edema . 2/6 crescendo decrescendo systolic murmur in the aortic area which is mid peaking Respiratory:  clear to auscultation bilaterally, normal work of breathing GI: soft, nontender, nondistended, + BS MS: no deformity or atrophy  Skin: warm and dry, no rash Neuro:  Strength and sensation are intact Psych: euthymic mood, full affect Vascular: Femoral pulse: Normal bilaterally.  Distal pulses are not palpable  EKG was not performed today   Recent Labs: No results found for requested labs within last 365 days.    Lipid Panel    Component Value Date/Time   CHOL 57 12/20/2019 0903   CHOL 75 (L) 02/06/2019 1052   TRIG 50.0 12/20/2019 0903   TRIG 96 03/12/2008 0000   HDL 34.50 (L) 12/20/2019 0903   HDL 37 (L) 02/06/2019 1052   CHOLHDL 2 12/20/2019 0903   VLDL 10.0 12/20/2019 0903   LDLCALC 12 12/20/2019 0903   LDLCALC 24 02/06/2019 1052   LDLCALC 91 03/12/2008 0000      Wt Readings from Last 3 Encounters:  11/16/22 171 lb 3.2 oz (77.7 kg)  10/18/22 172 lb (78 kg)  08/16/22 174 lb (78.9 kg)           No data to display            ASSESSMENT AND PLAN:  1.  Peripheral arterial disease: Previous iliac stents with chronically occluded right SFA.   Currently with no claudications.  Continue medical therapy.  2. Coronary artery disease involving native coronary arteries without angina: Continue aggressive medical therapy.  Continue low-dose aspirin.  3. Hyperlipidemia: Intolerance to statins but has been doing very well with Repatha with very low LDL.  4. Essential hypertension: Blood pressure is well controlled on current medications.  5.  Moderate aortic stenosis: He is asymptomatic.  Recommend repeat echocardiogram in August 2025.    6.  Tachycardia-bradycardia syndrome, status post dual-chamber pacemaker placement. Continue to follow-up with Dr. Salena Saner.   7.  Moderate bilateral carotid disease: this was noted on CT scan.  However, most recent carotid Doppler in August showed mild stenosis overall.  No need to repeat.  Thank you   Disposition:   FU with me in 6 months  Signed,  Lorine Bears, MD  11/16/2022 10:38 AM    [facility]

## 2022-11-16 NOTE — Patient Instructions (Signed)
Medication Instructions:  No changes *If you need a refill on your cardiac medications before your next appointment, please call your pharmacy*   Lab Work: None ordered If you have labs (blood work) drawn today and your tests are completely normal, you will receive your results only by: MyChart Message (if you have MyChart) OR A paper copy in the mail If you have any lab test that is abnormal or we need to change your treatment, we will call you to review the results.   Testing/Procedures: None ordered   Follow-Up: At [facility], you and your health needs are our priority.  As part of our continuing mission to provide you with exceptional heart care, we have created designated Provider Care Teams.  These Care Teams include your primary Cardiologist (physician) and Advanced Practice Providers (APPs -  Physician Assistants and Nurse Practitioners) who all work together to provide you with the care you need, when you need it.  We recommend signing up for the patient portal called "MyChart".  Sign up information is provided on this After Visit Summary.  MyChart is used to connect with patients for Virtual Visits (Telemedicine).  Patients are able to view lab/test results, encounter notes, upcoming appointments, etc.  Non-urgent messages can be sent to your provider as well.   To learn more about what you can do with MyChart, go to [email or web address].    Your next appointment:   6 month(s)  Provider:   Lorine Bears, MD

## 2022-12-20 ENCOUNTER — Ambulatory Visit: Payer: Medicare PPO | Admitting: Gastroenterology

## 2022-12-20 ENCOUNTER — Encounter: Payer: Self-pay | Admitting: Gastroenterology

## 2022-12-20 VITALS — BP 137/62 | HR 78 | Temp 98.7°F | Ht 67.0 in | Wt 173.4 lb

## 2022-12-20 DIAGNOSIS — K219 Gastro-esophageal reflux disease without esophagitis: Secondary | ICD-10-CM

## 2022-12-20 DIAGNOSIS — R140 Abdominal distension (gaseous): Secondary | ICD-10-CM

## 2022-12-20 DIAGNOSIS — K5900 Constipation, unspecified: Secondary | ICD-10-CM | POA: Diagnosis not present

## 2022-12-20 NOTE — Progress Notes (Signed)
GI Office Note    Referring Provider: Julien Girt, PA* Primary Care Physician:  Julien Girt, PA-C Primary Gastroenterologist: Gerrit Friends.Rourk, MD   Date:  12/20/2022  ID:  Marcus Norton, DOB 04-26-1940, MRN [id number]   Chief Complaint   Chief Complaint  Patient presents with   Follow-up    Pt following up on constipation, states he still has to take something for it   History of Present Illness  Marcus Norton is a 82 y.o. male with a history of GERD, constipation, CAD, adenomatous colon polyps, HLD, HTN, diabetes presenting today for follow-up of constipation.  Colonoscopy May 2018: - The entire examined colon is normal.  - The examination was otherwise normal on direct and retroflexion views.  - No specimens collected. - No repeat colonoscopy due to age.    Office visit 08/16/22. Usually daily BM but taking longer to go and having bloating/fullness sensation which is his main complaint. Had bene taking daily probiotic. Tried miralax in the past but made gassiness worse. Advised to use gas ex as needed. Use Linzess for 4 days then start daily stool softener. Consider adding fiber after improvement in gassiness. Advised to increase water intake. Avoid gas producing foods.  Last office visit 10/18/22. Taking align probiotic daily and having a bowel movement almost every day but continuing to have hard stools and need to strain to get started.  Previously tried MiraLAX and could not remember if he took Linzess and reports that if he did he did not notice much of a difference.  Denied frequent consumption of dairy products and having worsening bloating in the mornings.  Taking daily magnesium oxide.  Incomplete emptying present.  Seldom reflux symptoms, controlled on pantoprazole.  Advised abdominal x-ray, avoid dairy products and common gas producing foods and to start MiraLAX 17 g daily in 8 ounces of water and increase the stool softener to twice daily.  Also  recommended daily fiber supplement and continue probiotic.  Continue PPI daily  KUB not done.   Today: GERD: Denies N/V. Takes the pantoprazole daily and does well with this. He states if he stops taking it he will have symptoms. Has a great appetite, does notice within the last couple months its not been as good as it was. Denies weight loss. No dysphagia.   Constipation: Taking align and colace twice daily and still having some morning bloating but gets better throughout the day. He reports the miralax changed his bowels and was not doing well in regard to going. To the bathroom. He felt like the caliber of his stools changed with the miralax. He states that he does eat cereal almost every morning. Eats cheerios, raisan bran, corn flakes. Likes oatmeal with cinnamon. Does not have to strain generally. Denies abdominal pain. Deneis melena or brbpr.    Wt Readings from Last 3 Encounters:  12/20/22 173 lb 6.4 oz (78.7 kg)  11/16/22 171 lb 3.2 oz (77.7 kg)  10/18/22 172 lb (78 kg)   Current Outpatient Medications  Medication Sig Dispense Refill   acetaminophen (TYLENOL) 500 MG tablet Take 1,000 mg by mouth as needed.     albuterol (VENTOLIN HFA) 108 (90 Base) MCG/ACT inhaler Inhale 2 puffs into the lungs every 6 (six) hours as needed for wheezing or shortness of breath.     amLODipine (NORVASC) 5 MG tablet Take 1 tablet (5 mg total) by mouth daily. 90 tablet 1   aspirin EC 81 MG tablet Take 81 mg by mouth at  bedtime.      atorvastatin (LIPITOR) 10 MG tablet Take 1 tablet (10 mg total) by mouth once a week. 90 tablet 1   Cholecalciferol (VITAMIN D3) 50 MCG (2000 UT) TABS Take 2,000 Units by mouth daily.     Continuous Blood Gluc Sensor (FREESTYLE LIBRE 2 SENSOR) MISC 1 Device by Does not apply route every 14 (fourteen) days. 6 each 3   Evolocumab (REPATHA SURECLICK) 140 MG/ML SOAJ INJECT 1 ML INTO THE SKIN EVERY 14 (FOURTEEN) DAYS. 6 mL 1   fluorometholone (FML) 0.1 % ophthalmic suspension  Place 1 drop into both eyes 2 (two) times daily.     glucose blood (ACCU-CHEK AVIVA PLUS) test strip USE AS DIRECTED TO CHECK BLOOD SUGAR 2X DAILY 100 strip 1   insulin glargine (LANTUS SOLOSTAR) 100 UNIT/ML Solostar Pen Inject 28 Units into the skin every morning. (Patient taking differently: Inject 28 Units into the skin daily.) 30 mL 3   Insulin Pen Needle (PEN NEEDLES) 32G X 4 MM MISC To use daily with Lantus solostar. Dx E11.9 100 each 1   magnesium oxide (MAG-OX) 400 MG tablet Take 1 tablet by mouth daily.     memantine (NAMENDA) 10 MG tablet Take 10 mg by mouth every evening.     metFORMIN (GLUCOPHAGE-XR) 500 MG 24 hr tablet TAKE 4 TABLETS BY MOUTH EVERY DAY (Patient taking differently: Take 1,000 mg by mouth in the morning and at bedtime.) 360 tablet 1   metoprolol tartrate (LOPRESSOR) 50 MG tablet TAKE 1 TABLET BY MOUTH TWICE A DAY 180 tablet 2   nitroGLYCERIN (NITROSTAT) 0.4 MG SL tablet Place 1 tablet (0.4 mg total) under the tongue every 5 (five) minutes as needed for chest pain. 5 tablet 0   pantoprazole (PROTONIX) 40 MG tablet TAKE 1 TABLET(40 MG) BY MOUTH DAILY (Patient taking differently: Take 40 mg by mouth daily.) 90 tablet 1   Probiotic Product (ALIGN PO) Take by mouth.     RYBELSUS 14 MG TABS TAKE 1 TABLET BY MOUTH EVERY DAY (Patient taking differently: Take 1 tablet by mouth daily.) 90 tablet 1   silodosin (RAPAFLO) 4 MG CAPS capsule Take 4 mg by mouth every evening.     Suvorexant 10 MG TABS Take 10 mg by mouth at bedtime. 30 tablet 0   tadalafil (CIALIS) 20 MG tablet Take 0.5-1 tablets (10-20 mg total) by mouth every other day as needed for erectile dysfunction. 5 tablet 1   tadalafil (CIALIS) 5 MG tablet Take 5 mg by mouth daily.     vitamin C (ASCORBIC ACID) 250 MG tablet Take 500 mg by mouth daily.     No current facility-administered medications for this visit.    Past Medical History:  Diagnosis Date   Adenomatous colon polyp 2000/2010   Arthritis    "lower back"  (07/29/2016)   ASCVD (arteriosclerotic cardiovascular disease)    -luminal irregularities in 1998 and 2005; normal EF   Benign prostatic hypertrophy    status post transurethral resection of the prostate   CAD (coronary artery disease)    a. Cath 07/29/16 99% ostial RCA & 70% pRCA s/p cutting balloon angioplasty and single SYNERGY DES 2.5X28. 100% very Distal 1st RPLB lesion --> the occlusion site moved further distal with guidewire advancement;  40% ostial LAD; 50% Ost Cx to Prox Cx lesion.   Coronary artery disease    Dysphagia    Erectile dysfunction    GERD (gastroesophageal reflux disease)    Heart murmur  History of hiatal hernia    History of kidney stones    Hyperlipidemia    Hypertension    Seasonal allergies    Tobacco abuse    20 pack years; discontinued 1995   Type II diabetes mellitus (HCC)     Past Surgical History:  Procedure Laterality Date   APPENDECTOMY     CATARACT EXTRACTION W/ INTRAOCULAR LENS  IMPLANT, BILATERAL Bilateral    COLONOSCOPY  07/20/2011   Dr. Jena Gauss: multiple hyperplastic polyps. Surveillance 2018   COLONOSCOPY N/A 06/30/2016   Procedure: COLONOSCOPY;  Surgeon: Corbin Ade, MD;  Location: [facility];  Service: Endoscopy;  Laterality: N/A;  10:00am   COLONOSCOPY W/ BIOPSIES AND POLYPECTOMY  07/08/08, 06/2011   Dr Marlowe Alt papilla, diminutive rectal polyp ablated the, left-sided diverticula, piecemeal polypectomy of hepatic flexure adenomatous polyp, diminutive polyps near hepatic flexure ablated   CORONARY ANGIOPLASTY WITH STENT PLACEMENT  07/29/2016   CORONARY STENT INTERVENTION N/A 07/29/2016   Procedure: Coronary Stent Intervention;  Surgeon: Marykay Lex, MD;  Location: [facility] INVASIVE CV LAB;  Service: Cardiovascular;  Laterality: N/A;  RCA   ESOPHAGOGASTRODUODENOSCOPY  2013   erosive reflux esophagitis, s/p empiric dilation. chronic duodenitis.    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   Procedure: PACEMAKER IMPLANT;  Surgeon: Thurmon Fair, MD;  Location: [facility] INVASIVE CV LAB;  Service: Cardiovascular;  Laterality: N/A;   PERIPHERAL VASCULAR CATHETERIZATION N/A 11/26/2015   Procedure: Abdominal Aortogram w/Lower Extremity;  Surgeon: Iran Ouch, MD;  Location: [facility] INVASIVE CV LAB;  Service: Cardiovascular;  Laterality: N/A;   TONSILLECTOMY     TRANSURETHRAL RESECTION OF PROSTATE  2009    Family History  Problem Relation Age of Onset   Ovarian cancer Mother    Diabetes Brother    Colon cancer Sister 28       deceased from colon cancer   Diabetes Maternal Grandmother     Allergies as of 12/20/2022 - Review Complete 12/20/2022  Allergen Reaction Noted   Plavix [clopidogrel bisulfate] Shortness Of Breath 02/24/2016   Oxycontin [oxycodone hcl] Other (See Comments) 05/14/2012   Penicillins Other (See Comments) 04/30/2013   Pioglitazone Other (See Comments) 04/30/2013   Prednisone Other (See Comments) 07/14/2011   Simvastatin Other (See Comments) 05/14/2012    Social History   Socioeconomic History   Marital status: Married    Spouse name: Not on file   Number of children: 2   Years of education: Not on file   Highest education level: Not on file  Occupational History   Occupation: Counsellor, 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    Comment: Event organiser of agriculture  Tobacco Use   Smoking status: Former    Current packs/day: 0.00    Average packs/day: 1 pack/day for 28.3 years (28.3 ttl pk-yrs)    Types: Cigarettes    Start date: 03/08/1965    Quit date: 07/07/1993    Years since quitting: 29.4   Smokeless tobacco: Never  Vaping Use   Vaping status: Never Used  Substance and Sexual Activity   Alcohol use: No     Alcohol/week: 0.0 standard drinks of alcohol    Comment: 07/29/2016 "last drink was 4 wks  ago; had a beer"   Drug use: No   Sexual activity: Not Currently  Other Topics Concern   Not on file  Social History Narrative   5 grandchildren    Social Determinants of Health   Financial Resource Strain: Low Risk  (07/27/2022)   Received from [facility], [facility]   Overall Financial Resource Strain (CARDIA)    Difficulty of Paying Living Expenses: Not hard at all  Food Insecurity: No Food Insecurity (07/27/2022)   Received from [facility], [facility]   Hunger Vital Sign    Worried About Running Out of Food in the Last Year: Never true    Ran Out of Food in the Last Year: Never true  Transportation Needs: No Transportation Needs (07/27/2022)   Received from [facility], [facility]   PRAPARE - Transportation    Lack of Transportation (Medical): No    Lack of Transportation (Non-Medical): No  Physical Activity: Insufficiently Active (07/27/2022)   Received from [facility], [facility]   Exercise Vital Sign    Days of Exercise per Week: 7 days    Minutes of Exercise per Session: 10 min  Stress: No Stress Concern Present (07/27/2022)   Received from [facility], [facility] of Occupational Health - Occupational Stress Questionnaire    Feeling of Stress : Not at all  Social Connections: Socially Integrated (07/27/2022)   Received from [facility], [facility]   Social Network    How would you rate your social network (family, work, friends)?: Good participation with social networks   Review of Systems   Gen: Denies fever, chills, anorexia. Denies fatigue, weakness, weight loss.  CV: Denies chest pain, palpitations, syncope, peripheral edema, and claudication. Resp: Denies dyspnea at rest, cough, wheezing, coughing up blood, and pleurisy. GI: See HPI Derm: Denies rash, itching, dry skin Psych: Denies depression, anxiety, memory loss, confusion. No  homicidal or suicidal ideation.  Heme: Denies bruising, bleeding, and enlarged lymph nodes.  Physical Exam   BP 137/62   Pulse 78   Temp 98.7 F (37.1 C)   Ht 5\' 7"  (1.702 m)   Wt 173 lb 6.4 oz (78.7 kg)   BMI 27.16 kg/m   General:   Alert and oriented. No distress noted. Pleasant and cooperative.  Head:  Normocephalic and atraumatic. Eyes:  Conjuctiva clear without scleral icterus. Mouth:  Oral mucosa pink and moist. Good dentition. No lesions. Abdomen:  +BS, soft, non-tender and non-distended, mildly rounded. No rebound or guarding. No HSM or masses noted. Rectal: deferred Msk:  Symmetrical without gross deformities. Normal posture. Extremities:  Without edema. Neurologic:  Alert and  oriented x4 Psych:  Alert and cooperative. Normal mood and affect.  Assessment  Marcus Norton is a 82 y.o. male with a history of GERD, constipation, CAD, adenomatous colon polyps, HLD, HTN, diabetes presenting today for follow-up of constipation.  GERD: Well-controlled with pantoprazole 40 mg once daily.  Denies any nausea, vomiting, or dysphagia.  Constipation: Continuing to have some intermittent bloating in the morning times.  Having almost daily bowel movements without the need to strain.  Taking align daily and stool softener twice daily.  Symptoms overall fairly well-controlled.  Reports he was unable to tolerate MiraLAX as he states he was not able to go as easily and change his caliber stool.  Currently doing well without any tenderness today on exam.  Will continue current plan.  Discussed changing probiotic if bloating worsens.  Discussed with him the  possibility of using Lactaid in the mornings with his milk and cereal.  PLAN   Continue pantoprazole 40 mg once daily Continue align probiotic daily Continue stool softener twice daily Avoid common gas producing foods Trial lactaid with cereal in mornings.  Follow-up 6 months    Brooke Bonito, MSN, FNP-BC, AGACNP-BC [facility]

## 2022-12-20 NOTE — Patient Instructions (Signed)
Continue align probiotic daily.  If your bloating begins to worsen you can switch to a new probiotic such as Vear Clock' colon health, Culturelle, or Visbiome (only available at [facility] in [place] or friendly pharmacy in [place]).  Continue Colace twice daily.  Continue to eat a fiber rich diet.  You could try Lactaid tablets in the mornings with your cereal to cut down on bloating as I suspect the milk may be causing you some symptoms.  I hope you have a wonderful Thanksgiving, Christmas, and new year!  We will plan to see you again in 6 months.  It was a pleasure to see you today. I want to create trusting relationships with patients. If you receive a survey regarding your visit,  I greatly appreciate you taking time to fill this out on paper or through your MyChart. I value your feedback.  Brooke Bonito, MSN, FNP-BC, AGACNP-BC [facility]

## 2023-01-11 ENCOUNTER — Ambulatory Visit (INDEPENDENT_AMBULATORY_CARE_PROVIDER_SITE_OTHER): Payer: Medicare PPO

## 2023-01-11 DIAGNOSIS — I495 Sick sinus syndrome: Secondary | ICD-10-CM

## 2023-01-13 LAB — CUP PACEART REMOTE DEVICE CHECK
Battery Remaining Longevity: 94 mo
Battery Remaining Percentage: 77 %
Battery Voltage: 3.01 V
Brady Statistic AP VP Percent: 1 %
Brady Statistic AP VS Percent: 4.8 %
Brady Statistic AS VP Percent: 1 %
Brady Statistic AS VS Percent: 95 %
Brady Statistic RA Percent Paced: 4.7 %
Brady Statistic RV Percent Paced: 1 %
Date Time Interrogation Session: 20241120211907
Implantable Lead Connection Status: 753985
Implantable Lead Connection Status: 753985
Implantable Lead Implant Date: 20210823
Implantable Lead Implant Date: 20210823
Implantable Lead Location: 753859
Implantable Lead Location: 753860
Implantable Pulse Generator Implant Date: 20210823
Lead Channel Impedance Value: 360 Ohm
Lead Channel Impedance Value: 530 Ohm
Lead Channel Pacing Threshold Amplitude: 0.5 V
Lead Channel Pacing Threshold Amplitude: 1 V
Lead Channel Pacing Threshold Pulse Width: 0.5 ms
Lead Channel Pacing Threshold Pulse Width: 0.5 ms
Lead Channel Sensing Intrinsic Amplitude: 11 mV
Lead Channel Sensing Intrinsic Amplitude: 2.6 mV
Lead Channel Setting Pacing Amplitude: 2 V
Lead Channel Setting Pacing Amplitude: 2 V
Lead Channel Setting Pacing Pulse Width: 0.5 ms
Lead Channel Setting Sensing Sensitivity: 2 mV
Pulse Gen Model: 2272
Pulse Gen Serial Number: 3859349

## 2023-02-01 ENCOUNTER — Other Ambulatory Visit: Payer: Self-pay | Admitting: Cardiovascular Disease

## 2023-02-01 DIAGNOSIS — E785 Hyperlipidemia, unspecified: Secondary | ICD-10-CM

## 2023-02-01 DIAGNOSIS — I2510 Atherosclerotic heart disease of native coronary artery without angina pectoris: Secondary | ICD-10-CM

## 2023-02-03 ENCOUNTER — Telehealth: Payer: Self-pay | Admitting: Pharmacy Technician

## 2023-02-03 ENCOUNTER — Other Ambulatory Visit (HOSPITAL_COMMUNITY): Payer: Self-pay

## 2023-02-03 NOTE — Telephone Encounter (Signed)
Pharmacy Patient Advocate Encounter  Received notification from [facility] that Prior Authorization for repatha has been APPROVED from 11/23/22 to 02/22/24   PA #/Case ID/Reference #: Rosezena Sensor

## 2023-02-03 NOTE — Telephone Encounter (Signed)
Pharmacy Patient Advocate Encounter   Received notification from RX Request Messages that prior authorization for repatha is required/requested.   Insurance verification completed.   The patient is insured through [employer] .   Per test claim: PA required; PA submitted to above mentioned insurance via CoverMyMeds Key/confirmation #/EOC [id number] Status is pending

## 2023-02-07 NOTE — Progress Notes (Signed)
Remote pacemaker transmission.   

## 2023-02-07 NOTE — Addendum Note (Signed)
Addended by: Geralyn Flash D on: 02/07/2023 11:54 AM   Modules accepted: Orders

## 2023-04-12 ENCOUNTER — Ambulatory Visit (INDEPENDENT_AMBULATORY_CARE_PROVIDER_SITE_OTHER): Payer: Medicare PPO

## 2023-04-12 DIAGNOSIS — I495 Sick sinus syndrome: Secondary | ICD-10-CM | POA: Diagnosis not present

## 2023-04-13 LAB — CUP PACEART REMOTE DEVICE CHECK
Battery Remaining Longevity: 91 mo
Battery Remaining Percentage: 75 %
Battery Voltage: 3.01 V
Brady Statistic AP VP Percent: 1 %
Brady Statistic AP VS Percent: 4.7 %
Brady Statistic AS VP Percent: 1 %
Brady Statistic AS VS Percent: 95 %
Brady Statistic RA Percent Paced: 4.6 %
Brady Statistic RV Percent Paced: 1 %
Date Time Interrogation Session: 20250218040234
Implantable Lead Connection Status: 753985
Implantable Lead Connection Status: 753985
Implantable Lead Implant Date: 20210823
Implantable Lead Implant Date: 20210823
Implantable Lead Location: 753859
Implantable Lead Location: 753860
Implantable Pulse Generator Implant Date: 20210823
Lead Channel Impedance Value: 360 Ohm
Lead Channel Impedance Value: 530 Ohm
Lead Channel Pacing Threshold Amplitude: 0.5 V
Lead Channel Pacing Threshold Amplitude: 1 V
Lead Channel Pacing Threshold Pulse Width: 0.5 ms
Lead Channel Pacing Threshold Pulse Width: 0.5 ms
Lead Channel Sensing Intrinsic Amplitude: 2.7 mV
Lead Channel Sensing Intrinsic Amplitude: 7.7 mV
Lead Channel Setting Pacing Amplitude: 2 V
Lead Channel Setting Pacing Amplitude: 2 V
Lead Channel Setting Pacing Pulse Width: 0.5 ms
Lead Channel Setting Sensing Sensitivity: 2 mV
Pulse Gen Model: 2272
Pulse Gen Serial Number: 3859349

## 2023-04-26 ENCOUNTER — Ambulatory Visit: Payer: Medicare PPO | Attending: Cardiovascular Disease | Admitting: Cardiovascular Disease

## 2023-04-26 ENCOUNTER — Encounter: Payer: Self-pay | Admitting: Cardiovascular Disease

## 2023-04-26 VITALS — BP 142/60 | HR 65 | Ht 67.0 in | Wt 167.2 lb

## 2023-04-26 DIAGNOSIS — I739 Peripheral vascular disease, unspecified: Secondary | ICD-10-CM | POA: Diagnosis not present

## 2023-04-26 DIAGNOSIS — I6523 Occlusion and stenosis of bilateral carotid arteries: Secondary | ICD-10-CM

## 2023-04-26 DIAGNOSIS — I10 Essential (primary) hypertension: Secondary | ICD-10-CM

## 2023-04-26 DIAGNOSIS — I350 Nonrheumatic aortic (valve) stenosis: Secondary | ICD-10-CM | POA: Diagnosis not present

## 2023-04-26 DIAGNOSIS — I2510 Atherosclerotic heart disease of native coronary artery without angina pectoris: Secondary | ICD-10-CM | POA: Diagnosis not present

## 2023-04-26 DIAGNOSIS — E785 Hyperlipidemia, unspecified: Secondary | ICD-10-CM

## 2023-04-26 NOTE — Progress Notes (Signed)
 Cardiology Office Note   Date:  04/26/2023   ID:  [name], [name] [birth year], MRN [id number]  PCP:  Julien Girt, PA-C  Cardiologist: Dr. Kirke Corin  No chief complaint on file.      History of Present Illness: Marcus Norton is a 83 y.o. male who presents for a follow-up visit regarding peripheral arterial disease, PSVT, carotid disease and aortic stenosis.   He has known history of  paroxysmal supraventricular tachycardia,  coronary artery disease, aortic stenosis, type 2 diabetes since 1990, hypertension and hyperlipidemia. He has known history of claudication . He did not tolerate cilostazol due to palpitations. He is s/p bilateral common iliac artery kissing stent placement in 11/2015. He is known to have long calcified occlusion of the right SFA with reconstitution in the popliteal artery above the knee and diffuse moderate disease affecting the left SFA.   He had an abnormal nuclear stress test in 2018.  Cardiac catheterization in June, 2018 showed severe ostial RCA stenosis and occluded RPL2 with nonobstructive disease affecting the left system.  He underwent successful angioplasty and drug-eluting stent placement to the ostial right coronary artery. He has known history of intolerance to statins but has been doing well with Repatha.    He is status post pacemaker placement for tachybradycardia syndrome.    Most recent echocardiogram in August, 2023 showed normal LV systolic function with mild to moderate aortic stenosis with mean gradient of 14 mmHg and valve area of 1.4 cm grade.  He is accompanied by his wife today.  He has been doing well with no reported chest pain or worsening dyspnea.  He has minimal dizziness.  He does complain of bilateral hip discomfort likely due to arthritis but no significant claudication.  He continues to take Repatha.  Past Medical History:  Diagnosis Date   Adenomatous colon polyp 2000/2010   Arthritis    "lower back" (07/29/2016)    ASCVD (arteriosclerotic cardiovascular disease)    -luminal irregularities in 1998 and 2005; normal EF   Benign prostatic hypertrophy    status post transurethral resection of the prostate   CAD (coronary artery disease)    a. Cath 07/29/16 99% ostial RCA & 70% pRCA s/p cutting balloon angioplasty and single SYNERGY DES 2.5X28. 100% very Distal 1st RPLB lesion --> the occlusion site moved further distal with guidewire advancement;  40% ostial LAD; 50% Ost Cx to Prox Cx lesion.   Coronary artery disease    Dysphagia    Erectile dysfunction    GERD (gastroesophageal reflux disease)    Heart murmur    History of hiatal hernia    History of kidney stones    Hyperlipidemia    Hypertension    Seasonal allergies    Tobacco abuse    20 pack years; discontinued 1995   Type II diabetes mellitus (HCC)     Past Surgical History:  Procedure Laterality Date   APPENDECTOMY     CATARACT EXTRACTION W/ INTRAOCULAR LENS  IMPLANT, BILATERAL Bilateral    COLONOSCOPY  07/20/2011   Dr. Jena Gauss: multiple hyperplastic polyps. Surveillance 2018   COLONOSCOPY N/A 06/30/2016   Procedure: COLONOSCOPY;  Surgeon: Corbin Ade, MD;  Location: [facility];  Service: Endoscopy;  Laterality: N/A;  10:00am   COLONOSCOPY W/ BIOPSIES AND POLYPECTOMY  07/08/08, 06/2011   Dr Marlowe Alt papilla, diminutive rectal polyp ablated the, left-sided diverticula, piecemeal polypectomy of hepatic flexure adenomatous polyp, diminutive polyps near hepatic flexure ablated   CORONARY ANGIOPLASTY WITH STENT  PLACEMENT  07/29/2016   CORONARY STENT INTERVENTION N/A 07/29/2016   Procedure: Coronary Stent Intervention;  Surgeon: Marykay Lex, MD;  Location: [facility] INVASIVE CV LAB;  Service: Cardiovascular;  Laterality: N/A;  RCA   ESOPHAGOGASTRODUODENOSCOPY  2013   erosive reflux esophagitis, s/p empiric dilation. chronic duodenitis.    LEFT HEART CATH AND CORONARY ANGIOGRAPHY N/A 07/29/2016   Procedure: Left Heart Cath and Coronary  Angiography;  Surgeon: Marykay Lex, MD;  Location: [facility] INVASIVE CV LAB;  Service: Cardiovascular;  Laterality: N/A;   NASAL SEPTUM SURGERY     NASAL SINUS SURGERY     "cleaned out my sinus"   PACEMAKER IMPLANT N/A 10/15/2019   Procedure: PACEMAKER IMPLANT;  Surgeon: Thurmon Fair, MD;  Location: [facility] INVASIVE CV LAB;  Service: Cardiovascular;  Laterality: N/A;   PERIPHERAL VASCULAR CATHETERIZATION N/A 11/26/2015   Procedure: Abdominal Aortogram w/Lower Extremity;  Surgeon: Iran Ouch, MD;  Location: [facility] INVASIVE CV LAB;  Service: Cardiovascular;  Laterality: N/A;   TONSILLECTOMY     TRANSURETHRAL RESECTION OF PROSTATE  2009     Current Outpatient Medications  Medication Sig Dispense Refill   acetaminophen (TYLENOL) 500 MG tablet Take 1,000 mg by mouth as needed.     albuterol (VENTOLIN HFA) 108 (90 Base) MCG/ACT inhaler Inhale 2 puffs into the lungs every 6 (six) hours as needed for wheezing or shortness of breath.     amLODipine (NORVASC) 5 MG tablet Take 1 tablet (5 mg total) by mouth daily. 90 tablet 1   aspirin EC 81 MG tablet Take 81 mg by mouth at bedtime.      Cholecalciferol (VITAMIN D3) 50 MCG (2000 UT) TABS Take 2,000 Units by mouth daily.     Evolocumab (REPATHA SURECLICK) 140 MG/ML SOAJ INJECT 1 ML INTO THE SKIN EVERY 14 (FOURTEEN) DAYS. 6 mL 3   fluorometholone (FML) 0.1 % ophthalmic suspension Place 1 drop into both eyes 2 (two) times daily.     glucose blood (ACCU-CHEK AVIVA PLUS) test strip USE AS DIRECTED TO CHECK BLOOD SUGAR 2X DAILY 100 strip 1   insulin glargine (LANTUS SOLOSTAR) 100 UNIT/ML Solostar Pen Inject 28 Units into the skin every morning. (Patient taking differently: Inject 28 Units into the skin daily.) 30 mL 3   Insulin Pen Needle (PEN NEEDLES) 32G X 4 MM MISC To use daily with Lantus solostar. Dx E11.9 100 each 1   magnesium oxide (MAG-OX) 400 MG tablet Take 1 tablet by mouth daily.     memantine (NAMENDA) 10 MG tablet Take 10 mg by mouth every  evening.     metFORMIN (GLUCOPHAGE-XR) 500 MG 24 hr tablet TAKE 4 TABLETS BY MOUTH EVERY DAY (Patient taking differently: Take 1,000 mg by mouth in the morning and at bedtime.) 360 tablet 1   metoprolol tartrate (LOPRESSOR) 50 MG tablet TAKE 1 TABLET BY MOUTH TWICE A DAY 180 tablet 2   nitroGLYCERIN (NITROSTAT) 0.4 MG SL tablet Place 1 tablet (0.4 mg total) under the tongue every 5 (five) minutes as needed for chest pain. 5 tablet 0   pantoprazole (PROTONIX) 40 MG tablet TAKE 1 TABLET(40 MG) BY MOUTH DAILY (Patient taking differently: Take 40 mg by mouth daily.) 90 tablet 1   Probiotic Product (ALIGN PO) Take by mouth.     RYBELSUS 14 MG TABS TAKE 1 TABLET BY MOUTH EVERY DAY (Patient taking differently: Take 1 tablet by mouth daily.) 90 tablet 1   silodosin (RAPAFLO) 4 MG CAPS capsule Take 4 mg by mouth  every evening.     tadalafil (CIALIS) 20 MG tablet Take 0.5-1 tablets (10-20 mg total) by mouth every other day as needed for erectile dysfunction. 5 tablet 1   tadalafil (CIALIS) 5 MG tablet Take 5 mg by mouth daily.     vitamin C (ASCORBIC ACID) 250 MG tablet Take 500 mg by mouth daily.     atorvastatin (LIPITOR) 10 MG tablet Take 1 tablet (10 mg total) by mouth once a week. (Patient not taking: Reported on 04/26/2023) 90 tablet 1   Continuous Blood Gluc Sensor (FREESTYLE LIBRE 2 SENSOR) MISC 1 Device by Does not apply route every 14 (fourteen) days. (Patient not taking: Reported on 04/26/2023) 6 each 3   Suvorexant 10 MG TABS Take 10 mg by mouth at bedtime. (Patient not taking: Reported on 04/26/2023) 30 tablet 0   No current facility-administered medications for this visit.    Allergies:   Plavix [clopidogrel bisulfate], Oxycontin [oxycodone hcl], Penicillins, Pioglitazone, Prednisone, and Simvastatin    Social History:  The patient  reports that he quit smoking about 29 years ago. His smoking use included cigarettes. He started smoking about 58 years ago. He has a 28.3 pack-year smoking history.  He has never used smokeless tobacco. He reports that he does not drink alcohol and does not use drugs.   Family History:  The patient's family history includes Colon cancer (age of onset: 42) in his sister; Diabetes in his brother and maternal grandmother; Ovarian cancer in his mother.    ROS:  Please see the history of present illness.   Otherwise, review of systems are positive for none.   All other systems are reviewed and negative.    PHYSICAL EXAM: VS:  BP (!) 142/60   Pulse 65   Ht 5\' 7"  (1.702 m)   Wt 167 lb 3.2 oz (75.8 kg)   SpO2 95%   BMI 26.19 kg/m  , BMI Body mass index is 26.19 kg/m. GEN: Well nourished, well developed, in no acute distress  HEENT: normal  Neck: no JVD, carotid bruits, or masses Cardiac: RRR; no  rubs, or gallops,no edema . 2/6 crescendo decrescendo systolic murmur in the aortic area which is mid peaking Respiratory:  clear to auscultation bilaterally, normal work of breathing GI: soft, nontender, nondistended, + BS MS: no deformity or atrophy  Skin: warm and dry, no rash Neuro:  Strength and sensation are intact Psych: euthymic mood, full affect Vascular: Femoral pulse: Normal bilaterally.  Distal pulses are not palpable  EKG was performed today EKG showed: Normal sinus rhythm Left axis deviation Incomplete right bundle branch block Septal infarct , age undetermined   Recent Labs: No results found for requested labs within last 365 days.    Lipid Panel    Component Value Date/Time   CHOL 57 12/20/2019 0903   CHOL 75 (L) 02/06/2019 1052   TRIG 50.0 12/20/2019 0903   TRIG 96 03/12/2008 0000   HDL 34.50 (L) 12/20/2019 0903   HDL 37 (L) 02/06/2019 1052   CHOLHDL 2 12/20/2019 0903   VLDL 10.0 12/20/2019 0903   LDLCALC 12 12/20/2019 0903   LDLCALC 24 02/06/2019 1052   LDLCALC 91 03/12/2008 0000      Wt Readings from Last 3 Encounters:  04/26/23 167 lb 3.2 oz (75.8 kg)  12/20/22 173 lb 6.4 oz (78.7 kg)  11/16/22 171 lb 3.2 oz (77.7  kg)           No data to display  ASSESSMENT AND PLAN:  1.  Peripheral arterial disease: Previous iliac stents with chronically occluded right SFA.  Currently with no claudications.  Continue medical therapy.    2. Coronary artery disease involving native coronary arteries without angina: Continue aggressive medical therapy.  Continue low-dose aspirin.  3. Hyperlipidemia: Intolerance to statins but has been doing very well with Repatha with very low LDL.  4. Essential hypertension: Blood pressure is well controlled on current medications.  5.  Moderate aortic stenosis: He is asymptomatic.  I requested a follow-up echocardiogram to be done in August.  6.  Tachycardia-bradycardia syndrome, status post dual-chamber pacemaker placement. Continue to follow-up with Dr. Salena Saner.   7.  Moderate bilateral carotid disease: this was noted on CT scan.  However, most recent carotid Doppler in 2024 showed mild stenosis overall.  No need to repeat.    Disposition:   FU with me in 6 months  Signed,  Lorine Bears, MD  04/26/2023 10:28 AM    [facility]

## 2023-04-26 NOTE — Patient Instructions (Signed)
 Medication Instructions:  No changes *If you need a refill on your cardiac medications before your next appointment, please call your pharmacy*   Lab Work: None ordered If you have labs (blood work) drawn today and your tests are completely normal, you will receive your results only by: MyChart Message (if you have MyChart) OR A paper copy in the mail If you have any lab test that is abnormal or we need to change your treatment, we will call you to review the results.   Testing/Procedures: Your physician has requested that you have an echocardiogram in August. Echocardiography is a painless test that uses sound waves to create images of your heart. It provides your doctor with information about the size and shape of your heart and how well your heart's chambers and valves are working. You may receive an ultrasound enhancing agent through an IV if needed to better visualize your heart during the echo.This procedure takes approximately one hour. There are no restrictions for this procedure. This will take place at the [address], [address].   Please note: We ask at that you not bring children with you during ultrasound (echo/ vascular) testing. Due to room size and safety concerns, children are not allowed in the ultrasound rooms during exams. Our front office staff cannot provide observation of children in our lobby area while testing is being conducted. An adult accompanying a patient to their appointment will only be allowed in the ultrasound room at the discretion of the ultrasound technician under special circumstances. We apologize for any inconvenience.    Follow-Up: At [facility], you and your health needs are our priority.  As part of our continuing mission to provide you with exceptional heart care, we have created designated Provider Care Teams.  These Care Teams include your primary Cardiologist (physician) and Advanced Practice Providers (APPs -  Physician Assistants  and Nurse Practitioners) who all work together to provide you with the care you need, when you need it.  We recommend signing up for the patient portal called "MyChart".  Sign up information is provided on this After Visit Summary.  MyChart is used to connect with patients for Virtual Visits (Telemedicine).  Patients are able to view lab/test results, encounter notes, upcoming appointments, etc.  Non-urgent messages can be sent to your provider as well.   To learn more about what you can do with MyChart, go to [email or web address].    Your next appointment:   6 month(s)  Provider:   Lorine Bears, MD

## 2023-05-14 ENCOUNTER — Other Ambulatory Visit: Payer: Self-pay | Admitting: Cardiovascular Disease

## 2023-05-19 NOTE — Addendum Note (Signed)
 Addended by: Elease Etienne A on: 05/19/2023 09:54 AM   Modules accepted: Orders

## 2023-05-19 NOTE — Progress Notes (Signed)
 Remote pacemaker transmission.

## 2023-06-17 NOTE — Progress Notes (Unsigned)
 GI Office Note    Referring Provider: Maryln Sober, PA* Primary Care Physician:  Shepperson, Kirstin, PA-C Primary Gastroenterologist: Windsor Hatcher.Rourk, MD  Date:  06/20/2023  ID:  Marcus, Norton [birth year], MRN [id number]  Chief Complaint   Chief Complaint  Patient presents with   Follow-up    Constipation is better some days. Still waking up with bloating in the am   History of Present Illness  Marcus Norton is a 83 y.o. male with a history of GERD, constipation, CAD, adenomatous colon polyps, HLD, HTN, and diabetes presenting today with complaint of bloating.  Colonoscopy May 2018: - The entire examined colon is normal.  - The examination was otherwise normal on direct and retroflexion views.  - No specimens collected. - No repeat colonoscopy due to age.    Office visit 08/16/22. Usually daily BM but taking longer to go and having bloating/fullness sensation which is his main complaint. Had bene taking daily probiotic. Tried miralax  in the past but made gassiness worse. Advised to use gas ex as needed. Use Linzess for 4 days then start daily stool softener. Consider adding fiber after improvement in gassiness. Advised to increase water intake. Avoid gas producing foods.   OV 10/18/22. Taking align probiotic daily and having a bowel movement almost every day but continuing to have hard stools and need to strain to get started.  Previously tried MiraLAX  and could not remember if he took Linzess and reports that if he did he did not notice much of a difference.  Denied frequent consumption of dairy products and having worsening bloating in the mornings.  Taking daily magnesium  oxide.  Incomplete emptying present.  Seldom reflux symptoms, controlled on pantoprazole .  Advised abdominal x-ray, avoid dairy products and common gas producing foods and to start MiraLAX  17 g daily in 8 ounces of water and increase the stool softener to twice daily.  Also recommended daily fiber  supplement and continue probiotic.  Continue PPI daily   KUB not done.   Last office visit 12/20/22.GERD controlled on daily PPI. No dysphagia. Probiotic and BID colace. Having bloating. Thought caliber of stools changed with miralax . Admitted to eating cereal every morning. Advised to continue pantoprazole  40 mg once daily, align probiotic daily, stool softener twice daily.  Avoid, gas producing foods and try Lactaid milk with cereal in the mornings.  Today:  Having BM either daily or every other day. Still has some hard stools at times. Sometimes having to strain. Taking the probiotic and colace daily. Did not like the miralax . Dose not take dulcolax. Has never tried senna. No melena or brbpr.   Does not have any abdominal pain. Notices bloating more in the mornings. Believes the bloating is there even before he eats. He does have early satiety more than he used too. Appetite is less than it used to be but overall still good. Still eats snacks and does eat more than one meal per day.   Blood sugars have been up a little bit recently bur overall okay. Having some irritation with urination given sugar in his urine.   Reflux symptoms well controlled.   Wt Readings from Last 3 Encounters:  06/20/23 171 lb 9.6 oz (77.8 kg)  04/26/23 167 lb 3.2 oz (75.8 kg)  12/20/22 173 lb 6.4 oz (78.7 kg)    Current Outpatient Medications  Medication Sig Dispense Refill   acetaminophen  (TYLENOL ) 500 MG tablet Take 1,000 mg by mouth as needed.     albuterol  (VENTOLIN  HFA)  108 (90 Base) MCG/ACT inhaler Inhale 2 puffs into the lungs every 6 (six) hours as needed for wheezing or shortness of breath.     amLODipine  (NORVASC ) 5 MG tablet Take 1 tablet (5 mg total) by mouth daily. 90 tablet 1   aspirin  EC 81 MG tablet Take 81 mg by mouth at bedtime.      atorvastatin  (LIPITOR) 10 MG tablet Take 1 tablet (10 mg total) by mouth once a week. (Patient not taking: Reported on 04/26/2023) 90 tablet 1   Cholecalciferol  (VITAMIN D3) 50 MCG (2000 UT) TABS Take 2,000 Units by mouth daily.     Continuous Blood Gluc Sensor (FREESTYLE LIBRE 2 SENSOR) MISC 1 Device by Does not apply route every 14 (fourteen) days. (Patient not taking: Reported on 04/26/2023) 6 each 3   Evolocumab  (REPATHA  SURECLICK) 140 MG/ML SOAJ INJECT 1 ML INTO THE SKIN EVERY 14 (FOURTEEN) DAYS. 6 mL 3   fluorometholone (FML) 0.1 % ophthalmic suspension Place 1 drop into both eyes 2 (two) times daily.     glucose blood (ACCU-CHEK AVIVA PLUS) test strip USE AS DIRECTED TO CHECK BLOOD SUGAR 2X DAILY 100 strip 1   insulin  glargine (LANTUS  SOLOSTAR) 100 UNIT/ML Solostar Pen Inject 28 Units into the skin every morning. (Patient taking differently: Inject 28 Units into the skin daily.) 30 mL 3   Insulin  Pen Needle (PEN NEEDLES) 32G X 4 MM MISC To use daily with Lantus  solostar. Dx E11.9 100 each 1   magnesium  oxide (MAG-OX) 400 MG tablet Take 1 tablet by mouth daily.     memantine (NAMENDA) 10 MG tablet Take 10 mg by mouth every evening.     metFORMIN  (GLUCOPHAGE -XR) 500 MG 24 hr tablet TAKE 4 TABLETS BY MOUTH EVERY DAY (Patient taking differently: Take 1,000 mg by mouth in the morning and at bedtime.) 360 tablet 1   metoprolol  tartrate (LOPRESSOR ) 50 MG tablet TAKE 1 TABLET BY MOUTH TWICE A DAY 180 tablet 1   nitroGLYCERIN  (NITROSTAT ) 0.4 MG SL tablet Place 1 tablet (0.4 mg total) under the tongue every 5 (five) minutes as needed for chest pain. 5 tablet 0   pantoprazole  (PROTONIX ) 40 MG tablet TAKE 1 TABLET(40 MG) BY MOUTH DAILY (Patient taking differently: Take 40 mg by mouth daily.) 90 tablet 1   Probiotic Product (ALIGN PO) Take by mouth.     RYBELSUS  14 MG TABS TAKE 1 TABLET BY MOUTH EVERY DAY (Patient taking differently: Take 1 tablet by mouth daily.) 90 tablet 1   silodosin (RAPAFLO) 4 MG CAPS capsule Take 4 mg by mouth every evening.     Suvorexant  10 MG TABS Take 10 mg by mouth at bedtime. (Patient not taking: Reported on 04/26/2023) 30 tablet 0    tadalafil  (CIALIS ) 20 MG tablet Take 0.5-1 tablets (10-20 mg total) by mouth every other day as needed for erectile dysfunction. 5 tablet 1   tadalafil  (CIALIS ) 5 MG tablet Take 5 mg by mouth daily.     vitamin C (ASCORBIC ACID) 250 MG tablet Take 500 mg by mouth daily.     No current facility-administered medications for this visit.    Past Medical History:  Diagnosis Date   Adenomatous colon polyp 2000/2010   Arthritis    "lower back" (07/29/2016)   ASCVD (arteriosclerotic cardiovascular disease)    -luminal irregularities in 1998 and 2005; normal EF   Benign prostatic hypertrophy    status post transurethral resection of the prostate   CAD (coronary artery disease)  a. Cath 07/29/16 99% ostial RCA & 70% pRCA s/p cutting balloon angioplasty and single SYNERGY DES 2.5X28. 100% very Distal 1st RPLB lesion --> the occlusion site moved further distal with guidewire advancement;  40% ostial LAD; 50% Ost Cx to Prox Cx lesion.   Coronary artery disease    Dysphagia    Erectile dysfunction    GERD (gastroesophageal reflux disease)    Heart murmur    History of hiatal hernia    History of kidney stones    Hyperlipidemia    Hypertension    Seasonal allergies    Tobacco abuse    20 pack years; discontinued 1995   Type II diabetes mellitus (HCC)     Past Surgical History:  Procedure Laterality Date   APPENDECTOMY     CATARACT EXTRACTION W/ INTRAOCULAR LENS  IMPLANT, BILATERAL Bilateral    COLONOSCOPY  07/20/2011   Dr. Riley Cheadle: multiple hyperplastic polyps. Surveillance 2018   COLONOSCOPY N/A 06/30/2016   Procedure: COLONOSCOPY;  Surgeon: Suzette Espy, MD;  Location: [facility];  Service: Endoscopy;  Laterality: N/A;  10:00am   COLONOSCOPY W/ BIOPSIES AND POLYPECTOMY  07/08/08, 06/2011   Dr Lindaann Requena papilla, diminutive rectal polyp ablated the, left-sided diverticula, piecemeal polypectomy of hepatic flexure adenomatous polyp, diminutive polyps near hepatic flexure ablated   CORONARY  ANGIOPLASTY WITH STENT PLACEMENT  07/29/2016   CORONARY STENT INTERVENTION N/A 07/29/2016   Procedure: Coronary Stent Intervention;  Surgeon: Arleen Lacer, MD;  Location: [facility] INVASIVE CV LAB;  Service: Cardiovascular;  Laterality: N/A;  RCA   ESOPHAGOGASTRODUODENOSCOPY  2013   erosive reflux esophagitis, s/p empiric dilation. chronic duodenitis.    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/2019   Procedure: PACEMAKER IMPLANT;  Surgeon: Luana Rumple, MD;  Location: [facility] INVASIVE CV LAB;  Service: Cardiovascular;  Laterality: N/A;   PERIPHERAL VASCULAR CATHETERIZATION N/A 11/26/2015   Procedure: Abdominal Aortogram w/Lower Extremity;  Surgeon: Wenona Hamilton, MD;  Location: [facility] INVASIVE CV LAB;  Service: Cardiovascular;  Laterality: N/A;   TONSILLECTOMY     TRANSURETHRAL RESECTION OF PROSTATE  2009    Family History  Problem Relation Age of Onset   Ovarian cancer Mother    Diabetes Brother    Colon cancer Sister 67       deceased from colon cancer   Diabetes Maternal Grandmother     Allergies as of 06/20/2023 - Review Complete 04/26/2023  Allergen Reaction Noted   Plavix  [clopidogrel  bisulfate] Shortness Of Breath 02/24/2016   Oxycontin [oxycodone hcl] Other (See Comments) 05/14/2012   Penicillins Other (See Comments) 04/30/2013   Pioglitazone Other (See Comments) 04/30/2013   Prednisone  Other (See Comments) 07/14/2011   Simvastatin Other (See Comments) 05/14/2012    Social History   Socioeconomic History   Marital status: Married    Spouse name: Not on file   Number of children: 2   Years of education: Not on file   Highest education level: Not on file  Occupational History   Occupation:  Counsellor, 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    Comment: Event organiser of agriculture  Tobacco Use   Smoking status: Former    Current packs/day: 0.00    Average packs/day: 1  pack/day for 28.3 years (28.3 ttl pk-yrs)    Types: Cigarettes    Start date: 03/08/1965    Quit date: 07/07/1993    Years since quitting: 29.9   Smokeless tobacco: Never  Vaping Use   Vaping status: Never Used  Substance and Sexual Activity   Alcohol use: No    Alcohol/week: 0.0 standard drinks of alcohol    Comment: 07/29/2016 "last drink was 4 wks ago; had a beer"   Drug use: No   Sexual activity: Not Currently  Other Topics Concern   Not on file  Social History Narrative   5 grandchildren    Social Drivers of Health   Financial Resource Strain: Low Risk  (04/25/2023)   Received from [facility]   Overall Financial Resource Strain (CARDIA)    Difficulty of Paying Living Expenses: Not hard at all  Food Insecurity: No Food Insecurity (04/25/2023)   Received from [facility]   Hunger Vital Sign    Worried About Running Out of Food in the Last Year: Never true    Ran Out of Food in the Last Year: Never true  Transportation Needs: No Transportation Needs (04/25/2023)   Received from [facility] - Transportation    Lack of Transportation (Medical): No    Lack of Transportation (Non-Medical): No  Physical Activity: Insufficiently Active (07/27/2022)   Received from [facility], [facility]   Exercise Vital Sign    Days of Exercise per Week: 7 days    Minutes of Exercise per Session: 10 min  Stress: No Stress Concern Present (07/27/2022)   Received from [facility] Occupational [facility] - Occupational Stress Questionnaire    Feeling of Stress : Not at all  Social Connections: Socially Integrated (07/27/2022)   Received from [facility], [facility]   Social Network    How would you rate your social network (family, work, friends)?: Good participation with  social networks   Review of Systems   Gen: Denies fever, chills, anorexia. Denies fatigue, weakness, weight loss.  CV: Denies chest pain, palpitations, syncope, peripheral edema, and claudication. Resp: Denies dyspnea at rest, cough, wheezing, coughing up blood, and pleurisy. GI: See HPI Derm: Denies rash, itching, dry skin Psych: Denies depression, anxiety, memory loss, confusion. No homicidal or suicidal ideation.  Heme: Denies bruising, bleeding, and enlarged lymph nodes.  Physical Exam   BP (!) 154/55   Pulse 73   Temp (!) 97.5 F (36.4 C)   Ht 5\' 7"  (1.702 m)   Wt 171 lb 9.6 oz (77.8 kg)   BMI 26.88 kg/m   General:   Alert and oriented. No distress noted. Pleasant and cooperative.  Head:  Normocephalic and atraumatic. Eyes:  Conjuctiva clear without scleral icterus. Mouth:  Oral mucosa pink and moist. Good dentition. No lesions. Abdomen:  +BS, soft, non-tender and non-distended. No rebound or guarding. No HSM or masses noted. Rectal: deferred Msk:  Symmetrical without gross deformities. Normal posture. Extremities:  Without edema. Neurologic:  Alert and  oriented x4 Psych:  Alert and cooperative. Normal mood and affect.  Assessment  Marcus Norton is a 83 y.o. male with a history of GERD, constipation, CAD, adenomatous colon polyps, HLD, HTN, and diabetes presenting today for follow up with some improvement in constipation but ongoing AM bloating.   Constipation, bloating:  - Continues to have some mild morning bloating, does not feel as though this is related to diet - Continuing to have  some issues with constipation, still needing to strain intermittently and having some hard stools intermittently despite probiotic and Colace - BM daily or every other day - Denies any abdominal pain, nausea, or vomiting. - Given no significant improvement with probiotic and Colace and unable to tolerate MiraLAX , will trial Senokot nightly. - If no improvement with Senokot may try  Metamucil versus prescription management - Encouraged ongoing avoidance of common gas producing foods and use of Lactaid as needed  GERD: Well-controlled with pantoprazole  40 mg once daily.  No current dysphagia.  PLAN   Continue pantoprazole  40 mg once daily Continue probiotic and colace daily Add senna 2 tabs nightly. Reduce to once daily if needed.  Avoid common gas producing foods Lactaid as needed.  Follow up in 3 months.    Julian Obey, MSN, FNP-BC, AGACNP-BC [facility]

## 2023-06-20 ENCOUNTER — Encounter: Payer: Self-pay | Admitting: Gastroenterology

## 2023-06-20 ENCOUNTER — Ambulatory Visit: Payer: Medicare PPO | Admitting: Gastroenterology

## 2023-06-20 VITALS — BP 135/65 | HR 73 | Temp 97.5°F | Ht 67.0 in | Wt 171.6 lb

## 2023-06-20 DIAGNOSIS — K5900 Constipation, unspecified: Secondary | ICD-10-CM | POA: Diagnosis not present

## 2023-06-20 DIAGNOSIS — K219 Gastro-esophageal reflux disease without esophagitis: Secondary | ICD-10-CM | POA: Diagnosis not present

## 2023-06-20 DIAGNOSIS — R140 Abdominal distension (gaseous): Secondary | ICD-10-CM

## 2023-06-20 NOTE — Patient Instructions (Addendum)
 Continue taking your daily probiotic and your stool softener Colace daily.  I want you to start taking 2 Senokot tablets nightly.  There is an example of what to get from [facility] below.  If you start having multiple episodes of diarrhea, you can reduce to once nightly.    Continue pantoprazole  40 mg once daily.  Allergy to continue to take Lactaid tablets as needed if you are eating milk with cereal or any other dairy product such as ice cream or any soft cheeses.  Follow up in 3 months.  It was a pleasure to see you today. I want to create trusting relationships with patients. If you receive a survey regarding your visit,  I greatly appreciate you taking time to fill this out on paper or through your MyChart. I value your feedback.  Julian Obey, MSN, FNP-BC, AGACNP-BC [facility]

## 2023-07-12 ENCOUNTER — Ambulatory Visit (INDEPENDENT_AMBULATORY_CARE_PROVIDER_SITE_OTHER): Payer: Medicare PPO

## 2023-07-12 DIAGNOSIS — I495 Sick sinus syndrome: Secondary | ICD-10-CM

## 2023-07-12 LAB — CUP PACEART REMOTE DEVICE CHECK
Battery Remaining Longevity: 88 mo
Battery Remaining Percentage: 73 %
Battery Voltage: 3.01 V
Brady Statistic AP VP Percent: 1 %
Brady Statistic AP VS Percent: 4.9 %
Brady Statistic AS VP Percent: 1 %
Brady Statistic AS VS Percent: 95 %
Brady Statistic RA Percent Paced: 4.9 %
Brady Statistic RV Percent Paced: 1 %
Date Time Interrogation Session: 20250520093915
Implantable Lead Connection Status: 753985
Implantable Lead Connection Status: 753985
Implantable Lead Implant Date: 20210823
Implantable Lead Implant Date: 20210823
Implantable Lead Location: 753859
Implantable Lead Location: 753860
Implantable Pulse Generator Implant Date: 20210823
Lead Channel Impedance Value: 350 Ohm
Lead Channel Impedance Value: 480 Ohm
Lead Channel Pacing Threshold Amplitude: 0.5 V
Lead Channel Pacing Threshold Amplitude: 1 V
Lead Channel Pacing Threshold Pulse Width: 0.5 ms
Lead Channel Pacing Threshold Pulse Width: 0.5 ms
Lead Channel Sensing Intrinsic Amplitude: 2.2 mV
Lead Channel Sensing Intrinsic Amplitude: 5.9 mV
Lead Channel Setting Pacing Amplitude: 2 V
Lead Channel Setting Pacing Amplitude: 2 V
Lead Channel Setting Pacing Pulse Width: 0.5 ms
Lead Channel Setting Sensing Sensitivity: 2 mV
Pulse Gen Model: 2272
Pulse Gen Serial Number: 3859349

## 2023-07-18 ENCOUNTER — Ambulatory Visit: Payer: Self-pay | Admitting: Cardiovascular Disease

## 2023-08-02 ENCOUNTER — Encounter: Payer: Self-pay | Admitting: Gastroenterology

## 2023-08-11 ENCOUNTER — Ambulatory Visit (HOSPITAL_COMMUNITY)
Admission: RE | Admit: 2023-08-11 | Discharge: 2023-08-11 | Disposition: A | Discharge status: Home / Self Care | Source: Ambulatory Visit | Attending: Cardiovascular Disease | Admitting: Cardiovascular Disease

## 2023-08-11 ENCOUNTER — Ambulatory Visit (HOSPITAL_COMMUNITY)
Admission: RE | Admit: 2023-08-11 | Discharge: 2023-08-11 | Discharge status: Home / Self Care | Source: Ambulatory Visit | Attending: Cardiovascular Disease | Admitting: Cardiovascular Disease

## 2023-08-11 DIAGNOSIS — I739 Peripheral vascular disease, unspecified: Secondary | ICD-10-CM

## 2023-08-11 DIAGNOSIS — Z95828 Presence of other vascular implants and grafts: Secondary | ICD-10-CM | POA: Diagnosis not present

## 2023-08-12 ENCOUNTER — Ambulatory Visit: Payer: Self-pay | Admitting: Internal Medicine

## 2023-08-12 DIAGNOSIS — I739 Peripheral vascular disease, unspecified: Secondary | ICD-10-CM

## 2023-08-12 LAB — VAS US ABI WITH/WO TBI

## 2023-09-02 NOTE — Progress Notes (Signed)
 Remote pacemaker transmission.

## 2023-09-18 NOTE — Progress Notes (Unsigned)
 GI Office Note    Referring Provider: Donata Snowman, PA* Primary Care Physician:  Shepperson, Kirstin, PA-C Primary Gastroenterologist: Lamar HERO.Rourk, MD  Date:  09/19/2023  ID:  Marcus Norton, Marcus Norton 05-15-1940, MRN [id number]  Chief Complaint   Chief Complaint  Patient presents with   Follow-up    Pt arrives for follow up. Pt states he is still staying bloated. Medication is helping with his constipation.    History of Present Illness  Marcus Norton is a 83 y.o. male with a history of GERD, constipation, CAD, adenomatous colon polyps, HLD, HTN, and diabetes presenting today with complaint of bloating.  Colonoscopy May 2018: - The entire examined colon is normal.  - The examination was otherwise normal on direct and retroflexion views.  - No specimens collected. - No repeat colonoscopy due to age.    Office visit 08/16/22. Usually daily BM but taking longer to go and having bloating/fullness sensation which is his main complaint. Had bene taking daily probiotic. Tried miralax  in the past but made gassiness worse. Advised to use gas ex as needed. Use Linzess for 4 days then start daily stool softener. Consider adding fiber after improvement in gassiness. Advised to increase water intake. Avoid gas producing foods.   OV 10/18/22. Taking align probiotic daily and having a bowel movement almost every day but continuing to have hard stools and need to strain to get started.  Previously tried MiraLAX  and could not remember if he took Linzess and reports that if he did he did not notice much of a difference.  Denied frequent consumption of dairy products and having worsening bloating in the mornings.  Taking daily magnesium  oxide.  Incomplete emptying present.  Seldom reflux symptoms, controlled on pantoprazole .  Advised abdominal x-ray, avoid dairy products and common gas producing foods and to start MiraLAX  17 g daily in 8 ounces of water and increase the stool softener to twice  daily.  Also recommended daily fiber supplement and continue probiotic.  Continue PPI daily   KUB not done.    OV 12/20/22.GERD controlled on daily PPI. No dysphagia. Probiotic and BID colace. Having bloating. Thought caliber of stools changed with miralax . Admitted to eating cereal every morning. Advised to continue pantoprazole  40 mg once daily, align probiotic daily, stool softener twice daily.  Avoid, gas producing foods and try Lactaid milk with cereal in the mornings.  Last office visit 06/20/23.  Having daily or every other day bowel movement still with sometimes hard stools.  Not taking any Dulcolax and did not like taking MiraLAX .  Occasionally having to strain.  Continuing probiotic.  Reflux well-controlled.  Denies any abdominal pain but more so having bloating in the morning still. Advised to continue pantoprazole , probiotic, and colace daily. Advised to add senna 2 tabs nightly and avoid common gas producing foods. Used lactaid as needed.    Today:  Once in a while he will take colace. States the senna seems to work a little better. Still taking the probiotic everyday but still feeling bloated. Feels bloated when he gets up but otherwise not paying much attention to it. States he is not eating as much as he used to give he is not as active. Once in a while he report as gripe but that is not frequent. No brbrpr or melena. Notices a big BM at times then a little looser stools but that does not occur often. Generally has firm stool but not overtly hard. He states sometimes his stool will stop  up the commode. Sometimes his stools are large and thinks that what does it. He does report lots of gas on most days.   Reflux doing well. No dysphagia, N,V.    Wt Readings from Last 5 Encounters:  09/19/23 172 lb 3.2 oz (78.1 kg)  06/20/23 171 lb 9.6 oz (77.8 kg)  04/26/23 167 lb 3.2 oz (75.8 kg)  12/20/22 173 lb 6.4 oz (78.7 kg)  11/16/22 171 lb 3.2 oz (77.7 kg)    Current Outpatient  Medications  Medication Sig Dispense Refill   acetaminophen  (TYLENOL ) 500 MG tablet Take 1,000 mg by mouth as needed.     albuterol  (VENTOLIN  HFA) 108 (90 Base) MCG/ACT inhaler Inhale 2 puffs into the lungs every 6 (six) hours as needed for wheezing or shortness of breath.     amLODipine  (NORVASC ) 5 MG tablet Take 1 tablet (5 mg total) by mouth daily. 90 tablet 1   aspirin  EC 81 MG tablet Take 81 mg by mouth at bedtime.      BIOTIN PO Take by mouth.     Cholecalciferol (VITAMIN D3) 50 MCG (2000 UT) TABS Take 2,000 Units by mouth daily.     Continuous Blood Gluc Sensor (FREESTYLE LIBRE 2 SENSOR) MISC 1 Device by Does not apply route every 14 (fourteen) days. 6 each 3   Evolocumab  (REPATHA  SURECLICK) 140 MG/ML SOAJ INJECT 1 ML INTO THE SKIN EVERY 14 (FOURTEEN) DAYS. 6 mL 3   fluorometholone (FML) 0.1 % ophthalmic suspension Place 1 drop into both eyes 2 (two) times daily.     glucose blood (ACCU-CHEK AVIVA PLUS) test strip USE AS DIRECTED TO CHECK BLOOD SUGAR 2X DAILY 100 strip 1   insulin  glargine (LANTUS  SOLOSTAR) 100 UNIT/ML Solostar Pen Inject 28 Units into the skin every morning. 30 mL 3   Insulin  Pen Needle (PEN NEEDLES) 32G X 4 MM MISC To use daily with Lantus  solostar. Dx E11.9 100 each 1   magnesium  oxide (MAG-OX) 400 MG tablet Take 1 tablet by mouth daily.     memantine (NAMENDA) 10 MG tablet Take 10 mg by mouth every evening.     metFORMIN  (GLUCOPHAGE -XR) 500 MG 24 hr tablet TAKE 4 TABLETS BY MOUTH EVERY DAY 360 tablet 1   metoprolol  tartrate (LOPRESSOR ) 50 MG tablet TAKE 1 TABLET BY MOUTH TWICE A DAY 180 tablet 1   nitroGLYCERIN  (NITROSTAT ) 0.4 MG SL tablet Place 1 tablet (0.4 mg total) under the tongue every 5 (five) minutes as needed for chest pain. 5 tablet 0   pantoprazole  (PROTONIX ) 40 MG tablet TAKE 1 TABLET(40 MG) BY MOUTH DAILY 90 tablet 1   Probiotic Product (ALIGN PO) Take by mouth.     RYBELSUS  14 MG TABS TAKE 1 TABLET BY MOUTH EVERY DAY 90 tablet 1   silodosin (RAPAFLO) 4  MG CAPS capsule Take 4 mg by mouth every evening.     Suvorexant  10 MG TABS Take 10 mg by mouth at bedtime. 30 tablet 0   tadalafil  (CIALIS ) 5 MG tablet Take 5 mg by mouth daily.     vitamin C (ASCORBIC ACID) 250 MG tablet Take 500 mg by mouth daily.     No current facility-administered medications for this visit.    Past Medical History:  Diagnosis Date   Adenomatous colon polyp 2000/2010   Arthritis    lower back (07/29/2016)   ASCVD (arteriosclerotic cardiovascular disease)    -luminal irregularities in 1998 and 2005; normal EF   Benign prostatic hypertrophy    status  post transurethral resection of the prostate   CAD (coronary artery disease)    a. Cath 07/29/16 99% ostial RCA & 70% pRCA s/p cutting balloon angioplasty and single SYNERGY DES 2.5X28. 100% very Distal 1st RPLB lesion --> the occlusion site moved further distal with guidewire advancement;  40% ostial LAD; 50% Ost Cx to Prox Cx lesion.   Coronary artery disease    Dysphagia    Erectile dysfunction    GERD (gastroesophageal reflux disease)    Heart murmur    History of hiatal hernia    History of kidney stones    Hyperlipidemia    Hypertension    Seasonal allergies    Tobacco abuse    20 pack years; discontinued 1995   Type II diabetes mellitus (HCC)     Past Surgical History:  Procedure Laterality Date   APPENDECTOMY     CATARACT EXTRACTION W/ INTRAOCULAR LENS  IMPLANT, BILATERAL Bilateral    COLONOSCOPY  07/20/2011   Dr. Shaaron: multiple hyperplastic polyps. Surveillance 2018   COLONOSCOPY N/A 06/30/2016   Procedure: COLONOSCOPY;  Surgeon: Shaaron Lamar HERO, MD;  Location: [facility];  Service: Endoscopy;  Laterality: N/A;  10:00am   COLONOSCOPY W/ BIOPSIES AND POLYPECTOMY  07/08/08, 06/2011   Dr Simon papilla, diminutive rectal polyp ablated the, left-sided diverticula, piecemeal polypectomy of hepatic flexure adenomatous polyp, diminutive polyps near hepatic flexure ablated   CORONARY ANGIOPLASTY WITH  STENT PLACEMENT  07/29/2016   CORONARY STENT INTERVENTION N/A 07/29/2016   Procedure: Coronary Stent Intervention;  Surgeon: Anner Alm ORN, MD;  Location: [facility] INVASIVE CV LAB;  Service: Cardiovascular;  Laterality: N/A;  RCA   ESOPHAGOGASTRODUODENOSCOPY  2013   erosive reflux esophagitis, s/p empiric dilation. chronic duodenitis.    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   Procedure: PACEMAKER IMPLANT;  Surgeon: Francyne Headland, MD;  Location: [facility] INVASIVE CV LAB;  Service: Cardiovascular;  Laterality: N/A;   PERIPHERAL VASCULAR CATHETERIZATION N/A 11/26/2015   Procedure: Abdominal Aortogram w/Lower Extremity;  Surgeon: Deatrice DELENA Cage, MD;  Location: [facility] INVASIVE CV LAB;  Service: Cardiovascular;  Laterality: N/A;   TONSILLECTOMY     TRANSURETHRAL RESECTION OF PROSTATE  2009    Family History  Problem Relation Age of Onset   Ovarian cancer Mother    Diabetes Brother    Colon cancer Sister 36       deceased from colon cancer   Diabetes Maternal Grandmother     Allergies as of 09/19/2023 - Review Complete 09/19/2023  Allergen Reaction Noted   Plavix  [clopidogrel  bisulfate] Shortness Of Breath 02/24/2016   Oxycontin [oxycodone hcl] Other (See Comments) 05/14/2012   Penicillins Other (See Comments) 04/30/2013   Pioglitazone Other (See Comments) 04/30/2013   Prednisone  Other (See Comments) 07/14/2011   Simvastatin Other (See Comments) 05/14/2012    Social History   Socioeconomic History   Marital status: Married    Spouse name: Not on file   Number of children: 2   Years of education: Not on file   Highest education level: Not on file  Occupational History   Occupation: Counsellor, [place]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    Comment: Event organiser of agriculture  Tobacco Use  Smoking status: Former    Current packs/day: 0.00    Average packs/day: 1 pack/day for 28.3 years (28.3 ttl pk-yrs)    Types: Cigarettes    Start date: 03/08/1965    Quit date: 07/07/1993    Years since quitting: 30.2   Smokeless tobacco: Never  Vaping Use   Vaping status: Never Used  Substance and Sexual Activity   Alcohol use: No    Alcohol/week: 0.0 standard drinks of alcohol    Comment: 07/29/2016 last drink was 4 wks ago; had a beer   Drug use: No   Sexual activity: Not Currently  Other Topics Concern   Not on file  Social History Narrative   5 grandchildren    Social Drivers of Corporate investment banker Strain: Low Risk  (07/26/2023)   Received from [facility]   Overall Financial Resource Strain (CARDIA)    Difficulty of Paying Living Expenses: Not hard at all  Food Insecurity: No Food Insecurity (07/26/2023)   Received from [facility]   Hunger Vital Sign    Within the past 12 months, you worried that your food would run out before you got the money to buy more.: Never true    Within the past 12 months, the food you bought just didn't last and you didn't have money to get more.: Never true  Transportation Needs: No Transportation Needs (07/26/2023)   Received from [facility] - Transportation    Lack of Transportation (Medical): No    Lack of Transportation (Non-Medical): No  Physical Activity: Unknown (07/26/2023)   Received from [facility]   Exercise Vital Sign    On average, how many days per week do you engage in moderate to strenuous exercise (like a brisk walk)?: 0 days    Minutes of Exercise per Session: Not on file  Stress: No Stress Concern Present (07/26/2023)   Received from [facility] - Occupational Stress Questionnaire    Feeling of Stress : Only a little  Social Connections: Socially Integrated (07/26/2023)   Received from  [facility]   Social Network    How would you rate your social network (family, work, friends)?: Good participation with social networks     Review of Systems   Gen: Denies fever, chills, anorexia. Denies fatigue, weakness, weight loss.  CV: Denies chest pain, palpitations, syncope, peripheral edema, and claudication. Resp: Denies dyspnea at rest, cough, wheezing, coughing up blood, and pleurisy. GI: See HPI Derm: Denies rash, itching, dry skin Psych: Denies depression, anxiety, memory loss, confusion. No homicidal or suicidal ideation.  Heme: Denies bruising, bleeding, and enlarged lymph nodes.  Physical Exam   BP 106/61   Pulse 75   Temp 97.6 F (36.4 C)   Ht 5' 7 (1.702 m)   Wt 172 lb 3.2 oz (78.1 kg)   BMI 26.97 kg/m   General:   Alert and oriented. No distress noted. Pleasant and cooperative.  Head:  Normocephalic and atraumatic. Eyes:  Conjuctiva clear without scleral icterus. Mouth:  Oral mucosa pink and moist. Good dentition. No lesions. Lungs:  Clear to auscultation bilaterally. No wheezes, rales, or rhonchi. No distress.  Heart:  S1, S2 present without murmurs appreciated.  Abdomen:  +BS, soft, non-tender and non-distended. No rebound or guarding. No HSM or masses noted. Rectal: deferred Msk:  Symmetrical without gross deformities. Normal posture. Extremities:  Without edema. Neurologic:  Alert and  oriented x4 Psych:  Alert and cooperative. Normal  mood and affect.  Assessment  Marcus Norton is a 83 y.o. male presenting today to discuss constipation and bloating.  Constipation, bloating: Constipation much improved with Senokot, feels as though this works better than Colace.  Occasionally will take a Colace if stools are hard on certain days.  Given the bloating advised continue probiotic.  Suspect lactose intolerance may be contributing some to symptoms as she at times during the night or before bed will consume dairy and may be why he is experiencing  morning bloating/fullness sensation.  He has not been using this therefore I recommended him to give this a try and will continue other medications for now as does not seem to be affecting his ability to eat or causing any overt discomfort.  GERD: Well-controlled with pantoprazole  40 mg daily.  Denies any nausea, vomiting, dysphagia, or odynophagia.   PLAN   Start Lactaid prior to consumption of dairy to help reduce bloating. Continue pantoprazole  40 mg daily Continue daily probiotic Continue Senokot 1-2 tablets nightly Continue Colace as needed Follow up 6 months    Charmaine Melia, MSN, FNP-BC, AGACNP-BC [facility]

## 2023-09-19 ENCOUNTER — Encounter: Payer: Self-pay | Admitting: Gastroenterology

## 2023-09-19 ENCOUNTER — Ambulatory Visit (INDEPENDENT_AMBULATORY_CARE_PROVIDER_SITE_OTHER): Admitting: Gastroenterology

## 2023-09-19 VITALS — BP 106/61 | HR 75 | Temp 97.6°F | Ht 67.0 in | Wt 172.2 lb

## 2023-09-19 DIAGNOSIS — K5900 Constipation, unspecified: Secondary | ICD-10-CM

## 2023-09-19 DIAGNOSIS — R140 Abdominal distension (gaseous): Secondary | ICD-10-CM | POA: Diagnosis not present

## 2023-09-19 DIAGNOSIS — K219 Gastro-esophageal reflux disease without esophagitis: Secondary | ICD-10-CM

## 2023-09-19 NOTE — Patient Instructions (Addendum)
 I would like for you to take lactaid before eating or drinking any dairy products - follow directions on the box. I am hopeful this will help with bloating.      Continue your pantoprazole  and senna as well as colace as normal.  It was a pleasure to see you today. I want to create trusting relationships with patients. If you receive a survey regarding your visit,  I greatly appreciate you taking time to fill this out on paper or through your MyChart. I value your feedback.  Charmaine Melia, MSN, FNP-BC, AGACNP-BC [facility]

## 2023-09-26 ENCOUNTER — Ambulatory Visit (HOSPITAL_COMMUNITY)
Admission: RE | Admit: 2023-09-26 | Discharge: 2023-09-26 | Disposition: A | Discharge status: Home / Self Care | Source: Ambulatory Visit | Attending: Cardiology | Admitting: Cardiology

## 2023-09-26 DIAGNOSIS — I350 Nonrheumatic aortic (valve) stenosis: Secondary | ICD-10-CM | POA: Insufficient documentation

## 2023-09-26 LAB — ECHOCARDIOGRAM COMPLETE
AR max vel: 1.35 cm2
AV Area VTI: 1.39 cm2
AV Area mean vel: 1.58 cm2
AV Mean grad: 19 mmHg
AV Peak grad: 34.3 mmHg
Ao pk vel: 2.93 m/s
S' Lateral: 2.7 cm

## 2023-09-28 ENCOUNTER — Ambulatory Visit: Payer: Self-pay | Admitting: Cardiovascular Disease

## 2023-09-29 ENCOUNTER — Other Ambulatory Visit: Payer: Self-pay

## 2023-09-29 DIAGNOSIS — I350 Nonrheumatic aortic (valve) stenosis: Secondary | ICD-10-CM

## 2023-10-11 ENCOUNTER — Ambulatory Visit: Payer: Self-pay | Admitting: Cardiovascular Disease

## 2023-10-11 ENCOUNTER — Ambulatory Visit (INDEPENDENT_AMBULATORY_CARE_PROVIDER_SITE_OTHER): Payer: Medicare PPO

## 2023-10-11 DIAGNOSIS — I495 Sick sinus syndrome: Secondary | ICD-10-CM | POA: Diagnosis not present

## 2023-10-11 LAB — CUP PACEART REMOTE DEVICE CHECK
Battery Remaining Longevity: 86 mo
Battery Remaining Percentage: 71 %
Battery Voltage: 3.01 V
Brady Statistic AP VP Percent: 1 %
Brady Statistic AP VS Percent: 4.9 %
Brady Statistic AS VP Percent: 1 %
Brady Statistic AS VS Percent: 95 %
Brady Statistic RA Percent Paced: 4.8 %
Brady Statistic RV Percent Paced: 1 %
Date Time Interrogation Session: 20250819062240
Implantable Lead Connection Status: 753985
Implantable Lead Connection Status: 753985
Implantable Lead Implant Date: 20210823
Implantable Lead Implant Date: 20210823
Implantable Lead Location: 753859
Implantable Lead Location: 753860
Implantable Pulse Generator Implant Date: 20210823
Lead Channel Impedance Value: 360 Ohm
Lead Channel Impedance Value: 490 Ohm
Lead Channel Pacing Threshold Amplitude: 0.5 V
Lead Channel Pacing Threshold Amplitude: 1 V
Lead Channel Pacing Threshold Pulse Width: 0.5 ms
Lead Channel Pacing Threshold Pulse Width: 0.5 ms
Lead Channel Sensing Intrinsic Amplitude: 3 mV
Lead Channel Sensing Intrinsic Amplitude: 5.9 mV
Lead Channel Setting Pacing Amplitude: 2 V
Lead Channel Setting Pacing Amplitude: 2 V
Lead Channel Setting Pacing Pulse Width: 0.5 ms
Lead Channel Setting Sensing Sensitivity: 2 mV
Pulse Gen Model: 2272
Pulse Gen Serial Number: 3859349

## 2023-10-25 ENCOUNTER — Ambulatory Visit: Attending: Cardiovascular Disease | Admitting: Cardiovascular Disease

## 2023-10-25 ENCOUNTER — Encounter: Payer: Self-pay | Admitting: Cardiovascular Disease

## 2023-10-25 VITALS — BP 129/67 | HR 68 | Ht 66.0 in | Wt 172.0 lb

## 2023-10-25 DIAGNOSIS — I739 Peripheral vascular disease, unspecified: Secondary | ICD-10-CM

## 2023-10-25 DIAGNOSIS — I2510 Atherosclerotic heart disease of native coronary artery without angina pectoris: Secondary | ICD-10-CM | POA: Diagnosis not present

## 2023-10-25 DIAGNOSIS — E785 Hyperlipidemia, unspecified: Secondary | ICD-10-CM | POA: Diagnosis not present

## 2023-10-25 DIAGNOSIS — I10 Essential (primary) hypertension: Secondary | ICD-10-CM

## 2023-10-25 DIAGNOSIS — I6523 Occlusion and stenosis of bilateral carotid arteries: Secondary | ICD-10-CM

## 2023-10-25 DIAGNOSIS — I495 Sick sinus syndrome: Secondary | ICD-10-CM

## 2023-10-25 DIAGNOSIS — I350 Nonrheumatic aortic (valve) stenosis: Secondary | ICD-10-CM

## 2023-10-25 NOTE — Patient Instructions (Signed)
 Medication Instructions:  No changes *If you need a refill on your cardiac medications before your next appointment, please call your pharmacy*  Lab Work: None ordered If you have labs (blood work) drawn today and your tests are completely normal, you will receive your results only by: MyChart Message (if you have MyChart) OR A paper copy in the mail If you have any lab test that is abnormal or we need to change your treatment, we will call you to review the results.  Testing/Procedures: None ordered  Follow-Up: At [facility], you and your health needs are our priority.  As part of our continuing mission to provide you with exceptional heart care, our providers are all part of one team.  This team includes your primary Cardiologist (physician) and Advanced Practice Providers or APPs (Physician Assistants and Nurse Practitioners) who all work together to provide you with the care you need, when you need it.  Your next appointment:   6 month(s)  Provider:   Antionette Kirks, MD    We recommend signing up for the patient portal called "MyChart".  Sign up information is provided on this After Visit Summary.  MyChart is used to connect with patients for Virtual Visits (Telemedicine).  Patients are able to view lab/test results, encounter notes, upcoming appointments, etc.  Non-urgent messages can be sent to your provider as well.   To learn more about what you can do with MyChart, go to [email or web address].

## 2023-10-25 NOTE — Progress Notes (Signed)
 Cardiology Office Note   Date:  10/25/2023   ID:  Caulder M Malan, DOB [birth year], MRN [id number]  PCP:  Donata Snowman, PA-C  Cardiologist: Dr. Darron  No chief complaint on file.      History of Present Illness: Marcus Norton is a 83 y.o. male who presents for a follow-up visit regarding peripheral arterial disease, PSVT, carotid disease and aortic stenosis.   He has known history of  paroxysmal supraventricular tachycardia,  coronary artery disease, aortic stenosis, type 2 diabetes since 1990, hypertension and hyperlipidemia. He has known history of claudication . He did not tolerate cilostazol  due to palpitations. He is s/p bilateral common iliac artery kissing stent placement in 11/2015. He is known to have long calcified occlusion of the right SFA with reconstitution in the popliteal artery above the knee and diffuse moderate disease affecting the left SFA.   He had an abnormal nuclear stress test in 2018.  Cardiac catheterization in June, 2018 showed severe ostial RCA stenosis and occluded RPL2 with nonobstructive disease affecting the left system.  He underwent successful angioplasty and drug-eluting stent placement to the ostial right coronary artery. He has known history of intolerance to statins but has been doing well with Repatha .    He is status post pacemaker placement for tachybradycardia syndrome.    Prior echocardiogram in August, 2023 showed normal LV systolic function with mild to moderate aortic stenosis with mean gradient of 14 mmHg and valve area of 1.4 .  He is accompanied by his wife today.  He has been doing well with no reported chest pain or worsening dyspnea.  He has no significant calf claudication but does have hip arthritis which limits his mobility.  Past Medical History:  Diagnosis Date   Adenomatous colon polyp 2000/2010   Arthritis    lower back (07/29/2016)   ASCVD (arteriosclerotic cardiovascular disease)    -luminal irregularities  in 1998 and 2005; normal EF   Benign prostatic hypertrophy    status post transurethral resection of the prostate   CAD (coronary artery disease)    a. Cath 07/29/16 99% ostial RCA & 70% pRCA s/p cutting balloon angioplasty and single SYNERGY DES 2.5X28. 100% very Distal 1st RPLB lesion --> the occlusion site moved further distal with guidewire advancement;  40% ostial LAD; 50% Ost Cx to Prox Cx lesion.   Coronary artery disease    Dysphagia    Erectile dysfunction    GERD (gastroesophageal reflux disease)    Heart murmur    History of hiatal hernia    History of kidney stones    Hyperlipidemia    Hypertension    Seasonal allergies    Tobacco abuse    20 pack years; discontinued 1995   Type II diabetes mellitus (HCC)     Past Surgical History:  Procedure Laterality Date   APPENDECTOMY     CATARACT EXTRACTION W/ INTRAOCULAR LENS  IMPLANT, BILATERAL Bilateral    COLONOSCOPY  07/20/2011   Dr. Shaaron: multiple hyperplastic polyps. Surveillance 2018   COLONOSCOPY N/A 06/30/2016   Procedure: COLONOSCOPY;  Surgeon: Shaaron Lamar CHRISTELLA, MD;  Location: [facility];  Service: Endoscopy;  Laterality: N/A;  10:00am   COLONOSCOPY W/ BIOPSIES AND POLYPECTOMY  07/08/08, 06/2011   Dr Simon papilla, diminutive rectal polyp ablated the, left-sided diverticula, piecemeal polypectomy of hepatic flexure adenomatous polyp, diminutive polyps near hepatic flexure ablated   CORONARY ANGIOPLASTY WITH STENT PLACEMENT  07/29/2016   CORONARY STENT INTERVENTION N/A 07/29/2016   Procedure:  Coronary Stent Intervention;  Surgeon: Anner Alm ORN, MD;  Location: [facility] INVASIVE CV LAB;  Service: Cardiovascular;  Laterality: N/A;  RCA   ESOPHAGOGASTRODUODENOSCOPY  2013   erosive reflux esophagitis, s/p empiric dilation. chronic duodenitis.    LEFT HEART CATH AND CORONARY ANGIOGRAPHY N/A 07/29/2016   Procedure: Left Heart Cath and Coronary Angiography;  Surgeon: Anner Alm ORN, MD;  Location: [facility] INVASIVE CV LAB;  Service:  Cardiovascular;  Laterality: N/A;   NASAL SEPTUM SURGERY     NASAL SINUS SURGERY     cleaned out my sinus   PACEMAKER IMPLANT N/A 10/15/2019   Procedure: PACEMAKER IMPLANT;  Surgeon: Francyne Headland, MD;  Location: [facility] INVASIVE CV LAB;  Service: Cardiovascular;  Laterality: N/A;   PERIPHERAL VASCULAR CATHETERIZATION N/A 11/26/2015   Procedure: Abdominal Aortogram w/Lower Extremity;  Surgeon: Deatrice DELENA Cage, MD;  Location: [facility] INVASIVE CV LAB;  Service: Cardiovascular;  Laterality: N/A;   TONSILLECTOMY     TRANSURETHRAL RESECTION OF PROSTATE  2009     Current Outpatient Medications  Medication Sig Dispense Refill   acetaminophen  (TYLENOL ) 500 MG tablet Take 1,000 mg by mouth as needed.     albuterol  (VENTOLIN  HFA) 108 (90 Base) MCG/ACT inhaler Inhale 2 puffs into the lungs every 6 (six) hours as needed for wheezing or shortness of breath.     amLODipine  (NORVASC ) 5 MG tablet Take 1 tablet (5 mg total) by mouth daily. 90 tablet 1   aspirin  EC 81 MG tablet Take 81 mg by mouth at bedtime.      BIOTIN PO Take by mouth.     Cholecalciferol (VITAMIN D3) 50 MCG (2000 UT) TABS Take 2,000 Units by mouth daily.     Continuous Blood Gluc Sensor (FREESTYLE LIBRE 2 SENSOR) MISC 1 Device by Does not apply route every 14 (fourteen) days. 6 each 3   Evolocumab  (REPATHA  SURECLICK) 140 MG/ML SOAJ INJECT 1 ML INTO THE SKIN EVERY 14 (FOURTEEN) DAYS. 6 mL 3   fluorometholone (FML) 0.1 % ophthalmic suspension Place 1 drop into both eyes 2 (two) times daily.     glucose blood (ACCU-CHEK AVIVA PLUS) test strip USE AS DIRECTED TO CHECK BLOOD SUGAR 2X DAILY 100 strip 1   insulin  glargine (LANTUS  SOLOSTAR) 100 UNIT/ML Solostar Pen Inject 28 Units into the skin every morning. 30 mL 3   Insulin  Pen Needle (PEN NEEDLES) 32G X 4 MM MISC To use daily with Lantus  solostar. Dx E11.9 100 each 1   magnesium  oxide (MAG-OX) 400 MG tablet Take 1 tablet by mouth daily.     memantine (NAMENDA) 10 MG tablet Take 10 mg by mouth  every evening.     metFORMIN  (GLUCOPHAGE -XR) 500 MG 24 hr tablet TAKE 4 TABLETS BY MOUTH EVERY DAY 360 tablet 1   metoprolol  tartrate (LOPRESSOR ) 50 MG tablet TAKE 1 TABLET BY MOUTH TWICE A DAY 180 tablet 1   nitroGLYCERIN  (NITROSTAT ) 0.4 MG SL tablet Place 1 tablet (0.4 mg total) under the tongue every 5 (five) minutes as needed for chest pain. 5 tablet 0   pantoprazole  (PROTONIX ) 40 MG tablet TAKE 1 TABLET(40 MG) BY MOUTH DAILY 90 tablet 1   Probiotic Product (ALIGN PO) Take by mouth.     RYBELSUS  14 MG TABS TAKE 1 TABLET BY MOUTH EVERY DAY 90 tablet 1   silodosin (RAPAFLO) 4 MG CAPS capsule Take 4 mg by mouth every evening.     tadalafil  (CIALIS ) 5 MG tablet Take 5 mg by mouth daily.  vitamin C (ASCORBIC ACID) 250 MG tablet Take 500 mg by mouth daily.     Suvorexant  10 MG TABS Take 10 mg by mouth at bedtime. (Patient not taking: Reported on 10/25/2023) 30 tablet 0   No current facility-administered medications for this visit.    Allergies:   Plavix  [clopidogrel  bisulfate], Oxycontin [oxycodone hcl], Penicillins, Pioglitazone, Prednisone , and Simvastatin    Social History:  The patient  reports that he quit smoking about 30 years ago. His smoking use included cigarettes. He started smoking about 58 years ago. He has a 28.3 pack-year smoking history. He has never used smokeless tobacco. He reports that he does not drink alcohol and does not use drugs.   Family History:  The patient's family history includes Colon cancer (age of onset: 62) in his sister; Diabetes in his brother and maternal grandmother; Ovarian cancer in his mother.    ROS:  Please see the history of present illness.   Otherwise, review of systems are positive for none.   All other systems are reviewed and negative.    PHYSICAL EXAM: VS:  BP 129/67 (BP Location: Left Arm, Patient Position: Sitting)   Pulse 68   Ht 5' 6 (1.676 m)   Wt 172 lb (78 kg)   SpO2 94%   BMI 27.76 kg/m  , BMI Body mass index is 27.76  kg/m. GEN: Well nourished, well developed, in no acute distress  HEENT: normal  Neck: no JVD, carotid bruits, or masses Cardiac: RRR; no  rubs, or gallops,no edema . 2/6 crescendo decrescendo systolic murmur in the aortic area which is mid peaking Respiratory:  clear to auscultation bilaterally, normal work of breathing GI: soft, nontender, nondistended, + BS MS: no deformity or atrophy  Skin: warm and dry, no rash Neuro:  Strength and sensation are intact Psych: euthymic mood, full affect Vascular: Femoral pulse: Normal bilaterally.  Distal pulses are not palpable  EKG was performed today EKG showed: Normal sinus rhythm Left axis deviation Incomplete right bundle branch block Minimal voltage criteria for LVH, may be normal variant ( R in aVL ) Anteroseptal infarct (cited on or before 26-Apr-2023) When compared with ECG of 26-Apr-2023 10:13, Questionable change in initial forces of Anteroseptal leads    Recent Labs: No results found for requested labs within last 365 days.    Lipid Panel    Component Value Date/Time   CHOL 57 12/20/2019 0903   CHOL 75 (L) 02/06/2019 1052   TRIG 50.0 12/20/2019 0903   TRIG 96 03/12/2008 0000   HDL 34.50 (L) 12/20/2019 0903   HDL 37 (L) 02/06/2019 1052   CHOLHDL 2 12/20/2019 0903   VLDL 10.0 12/20/2019 0903   LDLCALC 12 12/20/2019 0903   LDLCALC 24 02/06/2019 1052   LDLCALC 91 03/12/2008 0000      Wt Readings from Last 3 Encounters:  10/25/23 172 lb (78 kg)  09/19/23 172 lb 3.2 oz (78.1 kg)  06/20/23 171 lb 9.6 oz (77.8 kg)           No data to display            ASSESSMENT AND PLAN:  1.  Peripheral arterial disease: Previous iliac stents with chronically occluded right SFA.  Currently with no claudications.  Most recent duplex in June showed patent iliac stents.  He has known noncompressible vessels with abnormal toe pressure.  He is limited by arthritis more than claudication.  2. Coronary artery disease involving  native coronary arteries without angina: Continue aggressive medical  therapy.  Continue low-dose aspirin .  3. Hyperlipidemia: Intolerance to statins but has been doing very well with Repatha  with very low LDL.  4. Essential hypertension: Blood pressure is well controlled on current medications.  5.  Moderate aortic stenosis: He is asymptomatic.  Recent echocardiogram showed stable moderate stenosis.  Will plan on repeat study in 1 year.  6.  Tachycardia-bradycardia syndrome, status post dual-chamber pacemaker placement. Continue to follow-up with Dr. JAYSON.   7.  Moderate bilateral carotid disease: this was noted on CT scan.  However, most recent carotid Doppler in 2024 showed mild stenosis overall.  No need to repeat.    Disposition:   FU with me in 6 months  Signed,  Deatrice Cage, MD  10/25/2023 10:07 AM    [facility]

## 2023-11-13 ENCOUNTER — Other Ambulatory Visit: Payer: Self-pay | Admitting: Cardiovascular Disease

## 2023-11-16 NOTE — Progress Notes (Signed)
 Remote PPM Transmission

## 2024-01-10 ENCOUNTER — Ambulatory Visit: Payer: Medicare PPO

## 2024-01-10 DIAGNOSIS — I495 Sick sinus syndrome: Secondary | ICD-10-CM | POA: Diagnosis not present

## 2024-01-10 DIAGNOSIS — I739 Peripheral vascular disease, unspecified: Secondary | ICD-10-CM

## 2024-01-11 LAB — CUP PACEART REMOTE DEVICE CHECK
Battery Remaining Longevity: 84 mo
Battery Remaining Percentage: 69 %
Battery Voltage: 3.01 V
Brady Statistic AP VP Percent: 1 %
Brady Statistic AP VS Percent: 4.8 %
Brady Statistic AS VP Percent: 1 %
Brady Statistic AS VS Percent: 95 %
Brady Statistic RA Percent Paced: 4.7 %
Brady Statistic RV Percent Paced: 1 %
Date Time Interrogation Session: 20251118220131
Implantable Lead Connection Status: 753985
Implantable Lead Connection Status: 753985
Implantable Lead Implant Date: 20210823
Implantable Lead Implant Date: 20210823
Implantable Lead Location: 753859
Implantable Lead Location: 753860
Implantable Pulse Generator Implant Date: 20210823
Lead Channel Impedance Value: 380 Ohm
Lead Channel Impedance Value: 510 Ohm
Lead Channel Pacing Threshold Amplitude: 0.5 V
Lead Channel Pacing Threshold Amplitude: 1 V
Lead Channel Pacing Threshold Pulse Width: 0.5 ms
Lead Channel Pacing Threshold Pulse Width: 0.5 ms
Lead Channel Sensing Intrinsic Amplitude: 2.7 mV
Lead Channel Sensing Intrinsic Amplitude: 7.4 mV
Lead Channel Setting Pacing Amplitude: 2 V
Lead Channel Setting Pacing Amplitude: 2 V
Lead Channel Setting Pacing Pulse Width: 0.5 ms
Lead Channel Setting Sensing Sensitivity: 2 mV
Pulse Gen Model: 2272
Pulse Gen Serial Number: 3859349

## 2024-01-12 NOTE — Progress Notes (Signed)
 Remote PPM Transmission

## 2024-01-16 ENCOUNTER — Ambulatory Visit: Payer: Self-pay | Admitting: Cardiovascular Disease

## 2024-01-26 ENCOUNTER — Encounter: Payer: Self-pay | Admitting: Gastroenterology

## 2024-02-12 ENCOUNTER — Other Ambulatory Visit: Payer: Self-pay | Admitting: Cardiovascular Disease

## 2024-02-12 DIAGNOSIS — E785 Hyperlipidemia, unspecified: Secondary | ICD-10-CM

## 2024-02-12 DIAGNOSIS — I2510 Atherosclerotic heart disease of native coronary artery without angina pectoris: Secondary | ICD-10-CM

## 2024-03-06 ENCOUNTER — Ambulatory Visit
Admission: EM | Admit: 2024-03-06 | Discharge: 2024-03-06 | Disposition: A | Discharge status: Home / Self Care | Attending: Nurse Practitioner | Admitting: Nurse Practitioner

## 2024-03-06 ENCOUNTER — Encounter: Payer: Self-pay | Admitting: Emergency Medicine

## 2024-03-06 DIAGNOSIS — L03213 Periorbital cellulitis: Secondary | ICD-10-CM

## 2024-03-06 MED ORDER — SULFAMETHOXAZOLE-TRIMETHOPRIM 800-160 MG PO TABS: 1.0000 | ORAL_TABLET | Freq: Two times a day (BID) | ORAL | 0 refills | Status: AC

## 2024-03-06 NOTE — ED Triage Notes (Addendum)
 Swelling around left eye area x 3 days.  Redness to skin.  States eye has been watering.

## 2024-03-06 NOTE — Discharge Instructions (Signed)
 Take medication as prescribed. You may take over-the-counter Tylenol  as needed for pain, fever, or general discomfort. You may continue use of the Systane eyedrops as needed. Continue applying cool compresses to the left eye.  Apply for 20 minutes, remove for 1 hour, repeat is much as possible over the next 24 to 48 hours. Continue to monitor your symptoms for worsening.  Go to the emergency department immediately if you have increased redness that extends around the eye or extends into your face, fever, chest pain, or other concerns. Follow-up as needed.

## 2024-03-06 NOTE — ED Provider Notes (Signed)
 " [facility]    CSN: [id number] Arrival date & time: 03/06/24  9160      History   Chief Complaint No chief complaint on file.   HPI Marcus Norton is a 84 y.o. male.   The history is provided by the patient.   Patient presents for complaints of redness and swelling around the left eye.  Patient states symptoms started over the past 3 days.  He reports prior to his symptoms starting, he went to the dermatologist and the dermatologist performed some procedure around the eyebrow of the left eye.  Since that time, he states he has swelling around the eye that extends down into the left cheek.  He states the area is nontender.  He denies visual changes, but states that the left eye has been watering.  Further denies fever, chills, purulent drainage from the eye, headache, or blurred vision.  States he has been using Systane eyedrops and applying cool compresses.  States that the swelling has improved.  Past Medical History:  Diagnosis Date   Adenomatous colon polyp 2000/2010   Arthritis    lower back (07/29/2016)   ASCVD (arteriosclerotic cardiovascular disease)    -luminal irregularities in 1998 and 2005; normal EF   Benign prostatic hypertrophy    status post transurethral resection of the prostate   CAD (coronary artery disease)    a. Cath 07/29/16 99% ostial RCA & 70% pRCA s/p cutting balloon angioplasty and single SYNERGY DES 2.5X28. 100% very Distal 1st RPLB lesion --> the occlusion site moved further distal with guidewire advancement;  40% ostial LAD; 50% Ost Cx to Prox Cx lesion.   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) 02/03/2021   Dyslipidemia (high LDL; low HDL) 01/04/2021   Angina pectoris 07/16/2020   Pacemaker 07/16/2020   Arteriosclerotic cardiovascular disease -nonobstructive 07/02/2020   Essential hypertension 07/02/2020   History of tobacco abuse 07/02/2020   Tachycardia-bradycardia syndrome (HCC) 10/01/2019   Bilateral carotid artery stenosis 10/01/2019   Coronary artery disease of native artery of native heart with stable angina pectoris 10/01/2019   Aortic valve stenosis, nonrheumatic 10/01/2019   DM (diabetes mellitus), type 2 with peripheral vascular complications (HCC) 10/01/2019   Severe nonproliferative diabetic retinopathy of right eye, with macular edema, associated with type 2 diabetes mellitus (HCC) 06/11/2019   Retinal hemorrhage of right eye 06/11/2019   Vitreomacular adhesion of both eyes 06/11/2019   Vitreomacular adhesion of right eye 06/11/2019   Chronic rhinitis 09/11/2018   OSA (obstructive sleep apnea) 07/12/2018   Restless leg syndrome 07/12/2018   PSVT (paroxysmal supraventricular tachycardia) 03/10/2018   Visit for preventive health examination 03/10/2018   Atypical chest pain 03/10/2018   Dysphagia 03/10/2018   Chronic cough 12/20/2017   CAD S/P percutaneous coronary angioplasty 07/20/2017   Exertional dyspnea 07/15/2017   GERD (gastroesophageal reflux disease) 04/14/2017   Hematuria 03/08/2017   Encounter for Medicare annual wellness  exam 03/08/2017   Need for pneumococcal vaccination 03/08/2017   Status post coronary artery stent placement    Atypical angina 07/29/2016   Abnormal nuclear stress test 07/29/2016   Constipation 05/12/2016   History of adenomatous polyp of colon 05/12/2016   Generalized anxiety disorder 03/04/2016   PAD (peripheral artery disease) (HCC) 02/26/2014   Mixed hyperlipidemia 07/11/2009    ERECTILE DYSFUNCTION, 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   Abnormal liver function tests 07/11/2009    Past Surgical History:  Procedure Laterality Date   APPENDECTOMY     CATARACT EXTRACTION W/ INTRAOCULAR LENS  IMPLANT, BILATERAL Bilateral    COLONOSCOPY  07/20/2011   Dr. Shaaron: multiple hyperplastic polyps. Surveillance 2018   COLONOSCOPY N/A 06/30/2016   Procedure: COLONOSCOPY;  Surgeon: Shaaron Lamar HERO, MD;  Location: [facility];  Service: Endoscopy;  Laterality: N/A;  10:00am   COLONOSCOPY W/ BIOPSIES AND POLYPECTOMY  07/08/08, 06/2011   Dr Simon papilla, diminutive rectal polyp ablated the, left-sided diverticula, piecemeal polypectomy of hepatic flexure adenomatous polyp, diminutive polyps near hepatic flexure ablated   CORONARY ANGIOPLASTY WITH STENT PLACEMENT  07/29/2016   CORONARY STENT INTERVENTION N/A 07/29/2016   Procedure: Coronary Stent Intervention;  Surgeon: Anner Alm ORN, MD;  Location: [facility] INVASIVE CV LAB;  Service: Cardiovascular;  Laterality: N/A;  RCA   ESOPHAGOGASTRODUODENOSCOPY  2013   erosive reflux esophagitis, s/p empiric dilation. chronic duodenitis.    LEFT HEART CATH AND CORONARY ANGIOGRAPHY N/A 07/29/2016   Procedure: Left Heart Cath and Coronary Angiography;  Surgeon: Anner Alm ORN, MD;  Location: [facility] INVASIVE CV LAB;  Service: Cardiovascular;  Laterality: N/A;   NASAL SEPTUM SURGERY     NASAL SINUS SURGERY     cleaned out my sinus   PACEMAKER IMPLANT N/A 10/15/2019   Procedure: PACEMAKER IMPLANT;  Surgeon: Francyne Headland, MD;  Location: [facility] INVASIVE CV LAB;  Service: Cardiovascular;  Laterality: N/A;   PERIPHERAL VASCULAR CATHETERIZATION N/A 11/26/2015   Procedure: Abdominal Aortogram w/Lower Extremity;  Surgeon: Deatrice DELENA Cage, MD;  Location: [facility] INVASIVE CV LAB;  Service: Cardiovascular;  Laterality: N/A;   TONSILLECTOMY     TRANSURETHRAL RESECTION OF PROSTATE  2009       Home Medications    Prior to Admission medications  Medication Sig Start Date End  Date Taking? Authorizing Provider  sulfamethoxazole -trimethoprim  (BACTRIM  DS) 800-160 MG tablet Take 1 tablet by mouth 2 (two) times daily for 5 days. 03/06/24 03/11/24 Yes Leath-Warren, Etta PARAS, NP  acetaminophen  (TYLENOL ) 500 MG tablet Take 1,000 mg by mouth as needed. 01/11/22   [provider]  albuterol  (VENTOLIN  HFA) 108 (90 Base) MCG/ACT inhaler Inhale 2 puffs into the lungs every 6 (six) hours as needed for wheezing or shortness of breath.    [provider]  amLODipine  (NORVASC ) 5 MG tablet Take 1 tablet (5 mg total) by mouth daily. 10/30/19   Cage Deatrice DELENA, MD  aspirin  EC 81 MG tablet Take 81 mg by mouth at bedtime.     [provider]  BIOTIN PO Take by mouth.    [provider]  Cholecalciferol (VITAMIN D3) 50 MCG (2000 UT) TABS Take 2,000 Units by mouth daily.    [provider]  Continuous Blood Gluc Sensor (FREESTYLE LIBRE 2 SENSOR) MISC 1 Device by Does not apply route every 14 (fourteen) days. 01/07/21   Kassie Mallick, MD  Evolocumab  (REPATHA  SURECLICK) 140 MG/ML SOAJ INJECT 1 ML INTO THE SKIN EVERY 14 (FOURTEEN) DAYS. 02/13/24   Cage Deatrice DELENA, MD  fluorometholone (  FML) 0.1 % ophthalmic suspension Place 1 drop into both eyes 2 (two) times daily. 09/02/20   [provider]  glucose blood (ACCU-CHEK AVIVA PLUS) test strip USE AS DIRECTED TO CHECK BLOOD SUGAR 2X DAILY 07/09/21   Trixie File, MD  insulin  glargine (LANTUS  SOLOSTAR) 100 UNIT/ML Solostar Pen Inject 28 Units into the skin every morning. 01/07/21   Kassie Mallick, MD  Insulin  Pen Needle (PEN NEEDLES) 32G X 4 MM MISC To use daily with Lantus  solostar. Dx E11.9 12/20/19   Gladis Elsie BROCKS, PA-C  magnesium  oxide (MAG-OX) 400 MG tablet Take 1 tablet by mouth daily. 01/16/22   [provider]  memantine (NAMENDA) 10 MG tablet Take 10 mg by mouth every evening. 04/20/21   [provider]  metFORMIN  (GLUCOPHAGE -XR) 500 MG 24 hr tablet TAKE 4 TABLETS BY  MOUTH EVERY DAY 02/18/21   Kassie Mallick, MD  metoprolol  tartrate (LOPRESSOR ) 50 MG tablet TAKE 1 TABLET BY MOUTH TWICE A DAY 11/14/23   Darron Deatrice LABOR, MD  nitroGLYCERIN  (NITROSTAT ) 0.4 MG SL tablet Place 1 tablet (0.4 mg total) under the tongue every 5 (five) minutes as needed for chest pain. 07/03/18   Gladis Elsie BROCKS, PA-C  pantoprazole  (PROTONIX ) 40 MG tablet TAKE 1 TABLET(40 MG) BY MOUTH DAILY 01/22/20   Gladis Elsie BROCKS, PA-C  Probiotic Product (ALIGN PO) Take by mouth.    [provider]  RYBELSUS  14 MG TABS TAKE 1 TABLET BY MOUTH EVERY DAY 04/13/21   Kassie Mallick, MD  silodosin (RAPAFLO) 4 MG CAPS capsule Take 4 mg by mouth every evening.    [provider]  Suvorexant  10 MG TABS Take 10 mg by mouth at bedtime. Patient not taking: Reported on 10/25/2023 01/30/20   Gladis Elsie BROCKS, PA-C  tadalafil  (CIALIS ) 5 MG tablet Take 5 mg by mouth daily. 03/10/22   [provider]  vitamin C (ASCORBIC ACID) 250 MG tablet Take 500 mg by mouth daily.    [provider]    Family History Family History  Problem Relation Age of Onset   Ovarian cancer Mother    Diabetes Brother    Colon cancer Sister 52       deceased from colon cancer   Diabetes Maternal Grandmother     Social History Social History[1]   Allergies   Plavix  [clopidogrel  bisulfate], Oxycontin [oxycodone hcl], Penicillins, Pioglitazone, Prednisone , and Simvastatin   Review of Systems Review of Systems Per HPI  Physical Exam Triage Vital Signs ED Triage Vitals  Encounter Vitals Group     BP 03/06/24 0957 127/68     Girls Systolic BP Percentile --      Girls Diastolic BP Percentile --      Boys Systolic BP Percentile --      Boys Diastolic BP Percentile --      Pulse Rate 03/06/24 0957 73     Resp 03/06/24 0957 16     Temp 03/06/24 0957 97.9 F (36.6 C)     Temp Source 03/06/24 0957 Oral     SpO2 03/06/24 0957 91 %     Weight --      Height --      Head Circumference --       Peak Flow --      Pain Score 03/06/24 0958 0     Pain Loc --      Pain Education --      Exclude from Growth Chart --    No data found.  Updated Vital Signs BP 127/68 (BP Location: Right Arm)   Pulse 73   Temp 97.9 F (36.6 C) (Oral)   Resp 16   SpO2 91%   Visual Acuity Right Eye Distance: 20/25 Left Eye Distance: 20/25 Bilateral Distance:    Right Eye Near:   Left Eye Near:    Bilateral Near:     Physical Exam Vitals and nursing note reviewed.  Constitutional:      General: He is not in acute distress.    Appearance: Normal appearance.  HENT:     Head: Normocephalic.     Right Ear: Tympanic membrane, ear canal and external ear normal.     Left Ear: Tympanic membrane, ear canal and external ear normal.     Nose: Nose normal.     Mouth/Throat:     Mouth: Mucous membranes are moist.  Eyes:     General:        Left eye: No foreign body, discharge or hordeolum.     Extraocular Movements: Extraocular movements intact.     Left eye: Normal extraocular motion and no nystagmus.     Conjunctiva/sclera: Conjunctivae normal.     Left eye: Left conjunctiva is not injected. No chemosis or exudate.    Pupils: Pupils are equal, round, and reactive to light.     Comments: Swelling and erythema noted around the periorbital region of the left eye.  Patient does have a healing wound in the left eyebrow.  There is no oozing, fluctuance, tenderness, or drainage present.  The area is nontender to palpation.  Cardiovascular:     Rate and Rhythm: Normal rate and regular rhythm.     Pulses: Normal pulses.     Heart sounds: Normal heart sounds.  Pulmonary:     Effort: Pulmonary effort is normal. No respiratory distress.     Breath sounds: Normal breath sounds. No stridor. No wheezing, rhonchi or rales.  Musculoskeletal:     Cervical back: Normal range of motion.  Skin:    General: Skin is warm and dry.  Neurological:     General: No focal deficit present.     Mental Status: He  is alert and oriented to person, place, and time.  Psychiatric:        Mood and Affect: Mood normal.        Behavior: Behavior normal.      UC Treatments / Results  Labs (all labs ordered are listed, but only abnormal results are displayed) Labs Reviewed - No data to display  EKG   Radiology No results found.  Procedures Procedures (including critical care time)  Medications Ordered in UC Medications - No data to display  Initial Impression / Assessment and Plan / UC Course  I have reviewed the triage vital signs and the nursing notes.  Pertinent labs & imaging results that were available during my care of the patient were reviewed by me and considered in my medical decision making (see chart for details).  Patient presents for complaints of swelling and redness around the left eye that has been present for the past 3 days.  On exam, he does have erythema and swelling around the periorbital region of the left eye extending into the left cheek.  He reports that he did have a dermatological procedure prior to his symptoms starting.  Concern for periorbital cellulitis.  Reviewed patient's most recent lab work, reviewed lab work from [facility] dated 01/26/24, creatinine 0.77, GFR 88.  Will treat empirically with Bactrim   DS 800/160 mg twice daily for the next 5 days.  Supportive care recommendations were provided discussed with the patient to include over-the-counter Tylenol , continuing cool compresses to the eye, and using eyedrops as needed.  Patient was given strict ER follow-up precautions.  Patient was in agreement with this plan of care and verbalizes understanding.  All questions were answered.  Patient stable for discharge.   Final Clinical Impressions(s) / UC Diagnoses   Final diagnoses:  Periorbital cellulitis of left eye     Discharge Instructions      Take medication as prescribed. You may take over-the-counter Tylenol  as needed for pain, fever, or general  discomfort. You may continue use of the Systane eyedrops as needed. Continue applying cool compresses to the left eye.  Apply for 20 minutes, remove for 1 hour, repeat is much as possible over the next 24 to 48 hours. Continue to monitor your symptoms for worsening.  Go to the emergency department immediately if you have increased redness that extends around the eye or extends into your face, fever, chest pain, or other concerns. Follow-up as needed.     ED Prescriptions     Medication Sig Dispense Auth. Provider   sulfamethoxazole -trimethoprim  (BACTRIM  DS) 800-160 MG tablet Take 1 tablet by mouth 2 (two) times daily for 5 days. 10 tablet Leath-Warren, Etta PARAS, NP      PDMP not reviewed this encounter.    [1]  Social History Tobacco Use   Smoking status: Former    Current packs/day: 0.00    Average packs/day: 1 pack/day for 28.3 years (28.3 ttl pk-yrs)    Types: Cigarettes    Start date: 03/08/1965    Quit date: 07/07/1993    Years since quitting: 30.6   Smokeless tobacco: Never  Vaping Use   Vaping status: Never Used  Substance Use Topics   Alcohol use: No    Alcohol/week: 0.0 standard drinks of alcohol    Comment: 07/29/2016 last drink was 4 wks ago; had a beer   Drug use: No     Gilmer Etta PARAS, NP 03/06/24 1022  "

## 2024-04-12 ENCOUNTER — Ambulatory Visit: Admitting: Gastroenterology
